# Patient Record
Sex: Female | Born: 1955 | Race: Black or African American | Hispanic: No | Marital: Single | Smoking: Never smoker
Health system: Southern US, Community
[De-identification: ages and names within clinical notes are randomized; demographics above are authoritative.]

## PROBLEM LIST (undated history)

## (undated) DIAGNOSIS — R51 Headache: Secondary | ICD-10-CM

## (undated) DIAGNOSIS — G459 Transient cerebral ischemic attack, unspecified: Secondary | ICD-10-CM

## (undated) DIAGNOSIS — Z992 Dependence on renal dialysis: Secondary | ICD-10-CM

## (undated) DIAGNOSIS — G629 Polyneuropathy, unspecified: Secondary | ICD-10-CM

## (undated) DIAGNOSIS — E785 Hyperlipidemia, unspecified: Secondary | ICD-10-CM

## (undated) DIAGNOSIS — I255 Ischemic cardiomyopathy: Secondary | ICD-10-CM

## (undated) DIAGNOSIS — D649 Anemia, unspecified: Secondary | ICD-10-CM

## (undated) DIAGNOSIS — I5022 Chronic systolic (congestive) heart failure: Secondary | ICD-10-CM

## (undated) DIAGNOSIS — I739 Peripheral vascular disease, unspecified: Secondary | ICD-10-CM

## (undated) DIAGNOSIS — E11621 Type 2 diabetes mellitus with foot ulcer: Secondary | ICD-10-CM

## (undated) DIAGNOSIS — E119 Type 2 diabetes mellitus without complications: Secondary | ICD-10-CM

## (undated) DIAGNOSIS — J811 Chronic pulmonary edema: Secondary | ICD-10-CM

## (undated) DIAGNOSIS — T148XXA Other injury of unspecified body region, initial encounter: Secondary | ICD-10-CM

## (undated) DIAGNOSIS — R519 Headache, unspecified: Secondary | ICD-10-CM

## (undated) DIAGNOSIS — M329 Systemic lupus erythematosus, unspecified: Secondary | ICD-10-CM

## (undated) DIAGNOSIS — N2581 Secondary hyperparathyroidism of renal origin: Secondary | ICD-10-CM

## (undated) DIAGNOSIS — G4733 Obstructive sleep apnea (adult) (pediatric): Secondary | ICD-10-CM

## (undated) DIAGNOSIS — L089 Local infection of the skin and subcutaneous tissue, unspecified: Secondary | ICD-10-CM

## (undated) DIAGNOSIS — I272 Pulmonary hypertension, unspecified: Secondary | ICD-10-CM

## (undated) DIAGNOSIS — N289 Disorder of kidney and ureter, unspecified: Secondary | ICD-10-CM

## (undated) DIAGNOSIS — R0602 Shortness of breath: Secondary | ICD-10-CM

## (undated) DIAGNOSIS — G5601 Carpal tunnel syndrome, right upper limb: Secondary | ICD-10-CM

## (undated) DIAGNOSIS — I2510 Atherosclerotic heart disease of native coronary artery without angina pectoris: Secondary | ICD-10-CM

## (undated) DIAGNOSIS — L97409 Non-pressure chronic ulcer of unspecified heel and midfoot with unspecified severity: Secondary | ICD-10-CM

## (undated) DIAGNOSIS — I4891 Unspecified atrial fibrillation: Secondary | ICD-10-CM

## (undated) DIAGNOSIS — E079 Disorder of thyroid, unspecified: Secondary | ICD-10-CM

## (undated) DIAGNOSIS — L723 Sebaceous cyst: Secondary | ICD-10-CM

## (undated) DIAGNOSIS — E1142 Type 2 diabetes mellitus with diabetic polyneuropathy: Secondary | ICD-10-CM

## (undated) HISTORY — DX: Peripheral vascular disease, unspecified: I73.9

## (undated) HISTORY — PX: KIDNEY TRANSPLANT: SHX239

## (undated) HISTORY — DX: Disorder of kidney and ureter, unspecified: N28.9

## (undated) HISTORY — PX: ABOVE KNEE LEG AMPUTATION: SUR20

## (undated) HISTORY — DX: Hyperlipidemia, unspecified: E78.5

## (undated) HISTORY — DX: Polyneuropathy, unspecified: G62.9

## (undated) HISTORY — DX: Atherosclerotic heart disease of native coronary artery without angina pectoris: I25.10

## (undated) HISTORY — PX: COLONOSCOPY: SHX174

## (undated) HISTORY — DX: Unspecified atrial fibrillation: I48.91

## (undated) HISTORY — DX: Chronic pulmonary edema: J81.1

## (undated) HISTORY — PX: CARDIAC CATHETERIZATION: SHX172

## (undated) HISTORY — PX: OVARIAN CYST SURGERY: SHX726

## (undated) HISTORY — DX: Sebaceous cyst: L72.3

## (undated) HISTORY — DX: Type 2 diabetes mellitus without complications: E11.9

## (undated) HISTORY — DX: Disorder of thyroid, unspecified: E07.9

## (undated) HISTORY — DX: Pulmonary hypertension, unspecified: I27.20

## (undated) HISTORY — PX: INSERTION OF DIALYSIS CATHETER: SHX1324

## (undated) HISTORY — DX: Secondary hyperparathyroidism of renal origin: N25.81

## (undated) HISTORY — DX: Anemia, unspecified: D64.9

## (undated) HISTORY — DX: Systemic lupus erythematosus, unspecified: M32.9

## (undated) HISTORY — DX: Type 2 diabetes mellitus with diabetic polyneuropathy: E11.42

## (undated) HISTORY — DX: Chronic systolic (congestive) heart failure: I50.22

## (undated) HISTORY — DX: Ischemic cardiomyopathy: I25.5

## (undated) HISTORY — PX: BELOW KNEE LEG AMPUTATION: SUR23

## (undated) HISTORY — DX: Obstructive sleep apnea (adult) (pediatric): G47.33

## (undated) SURGERY — ECHOCARDIOGRAM, TRANSESOPHAGEAL
Anesthesia: Moderate Sedation

---

## 1998-03-13 ENCOUNTER — Ambulatory Visit (HOSPITAL_COMMUNITY): Admission: RE | Admit: 1998-03-13 | Discharge: 1998-03-13 | Payer: Self-pay | Admitting: Interventional Cardiology

## 1998-07-08 ENCOUNTER — Other Ambulatory Visit: Admission: RE | Admit: 1998-07-08 | Discharge: 1998-07-08 | Payer: Self-pay | Admitting: Obstetrics and Gynecology

## 1998-09-22 ENCOUNTER — Other Ambulatory Visit: Admission: RE | Admit: 1998-09-22 | Discharge: 1998-09-22 | Payer: Self-pay | Admitting: Obstetrics and Gynecology

## 1998-10-14 ENCOUNTER — Encounter: Payer: Self-pay | Admitting: Nephrology

## 1998-10-14 ENCOUNTER — Ambulatory Visit (HOSPITAL_COMMUNITY): Admission: RE | Admit: 1998-10-14 | Discharge: 1998-10-14 | Payer: Self-pay | Admitting: Nephrology

## 1999-01-01 ENCOUNTER — Encounter (HOSPITAL_COMMUNITY): Admission: RE | Admit: 1999-01-01 | Discharge: 1999-04-01 | Payer: Self-pay | Admitting: Nephrology

## 1999-02-06 ENCOUNTER — Ambulatory Visit (HOSPITAL_COMMUNITY): Admission: RE | Admit: 1999-02-06 | Discharge: 1999-02-06 | Payer: Self-pay | Admitting: Vascular Surgery

## 1999-02-06 ENCOUNTER — Encounter: Payer: Self-pay | Admitting: Vascular Surgery

## 1999-04-09 ENCOUNTER — Encounter (HOSPITAL_COMMUNITY): Admission: RE | Admit: 1999-04-09 | Discharge: 1999-07-08 | Payer: Self-pay | Admitting: Nephrology

## 1999-07-08 ENCOUNTER — Other Ambulatory Visit: Admission: RE | Admit: 1999-07-08 | Discharge: 1999-07-08 | Payer: Self-pay | Admitting: Obstetrics and Gynecology

## 1999-07-09 ENCOUNTER — Other Ambulatory Visit: Admission: RE | Admit: 1999-07-09 | Discharge: 1999-07-09 | Payer: Self-pay | Admitting: Otolaryngology

## 1999-07-09 ENCOUNTER — Encounter (HOSPITAL_COMMUNITY): Admission: RE | Admit: 1999-07-09 | Discharge: 1999-10-07 | Payer: Self-pay | Admitting: Nephrology

## 1999-07-21 ENCOUNTER — Encounter: Admission: RE | Admit: 1999-07-21 | Discharge: 1999-07-21 | Payer: Self-pay | Admitting: Otolaryngology

## 1999-07-21 ENCOUNTER — Encounter: Payer: Self-pay | Admitting: Otolaryngology

## 1999-11-13 ENCOUNTER — Encounter: Admission: RE | Admit: 1999-11-13 | Discharge: 1999-11-13 | Payer: Self-pay | Admitting: Obstetrics and Gynecology

## 1999-11-13 ENCOUNTER — Encounter: Payer: Self-pay | Admitting: Obstetrics and Gynecology

## 1999-11-16 ENCOUNTER — Ambulatory Visit (HOSPITAL_COMMUNITY): Admission: RE | Admit: 1999-11-16 | Discharge: 1999-11-16 | Payer: Self-pay | Admitting: *Deleted

## 1999-11-18 ENCOUNTER — Encounter: Payer: Self-pay | Admitting: Nephrology

## 1999-11-18 ENCOUNTER — Ambulatory Visit (HOSPITAL_COMMUNITY): Admission: RE | Admit: 1999-11-18 | Discharge: 1999-11-18 | Payer: Self-pay | Admitting: Nephrology

## 2000-03-29 ENCOUNTER — Ambulatory Visit (HOSPITAL_COMMUNITY): Admission: RE | Admit: 2000-03-29 | Discharge: 2000-03-29 | Payer: Self-pay | Admitting: *Deleted

## 2000-07-12 ENCOUNTER — Other Ambulatory Visit: Admission: RE | Admit: 2000-07-12 | Discharge: 2000-07-12 | Payer: Self-pay | Admitting: Obstetrics and Gynecology

## 2000-07-21 ENCOUNTER — Encounter: Payer: Self-pay | Admitting: *Deleted

## 2000-07-21 ENCOUNTER — Ambulatory Visit (HOSPITAL_COMMUNITY): Admission: RE | Admit: 2000-07-21 | Discharge: 2000-07-21 | Payer: Self-pay | Admitting: *Deleted

## 2000-11-09 ENCOUNTER — Ambulatory Visit (HOSPITAL_COMMUNITY): Admission: RE | Admit: 2000-11-09 | Discharge: 2000-11-09 | Payer: Self-pay | Admitting: Nephrology

## 2000-11-09 ENCOUNTER — Encounter: Payer: Self-pay | Admitting: Nephrology

## 2000-12-08 ENCOUNTER — Encounter: Payer: Self-pay | Admitting: Obstetrics and Gynecology

## 2000-12-08 ENCOUNTER — Encounter: Admission: RE | Admit: 2000-12-08 | Discharge: 2000-12-08 | Payer: Self-pay | Admitting: Obstetrics and Gynecology

## 2001-01-26 ENCOUNTER — Encounter: Payer: Self-pay | Admitting: Nephrology

## 2001-01-26 ENCOUNTER — Ambulatory Visit (HOSPITAL_COMMUNITY): Admission: RE | Admit: 2001-01-26 | Discharge: 2001-01-26 | Payer: Self-pay | Admitting: Nephrology

## 2001-07-18 ENCOUNTER — Ambulatory Visit (HOSPITAL_COMMUNITY): Admission: RE | Admit: 2001-07-18 | Discharge: 2001-07-18 | Payer: Self-pay | Admitting: Nephrology

## 2001-09-04 ENCOUNTER — Other Ambulatory Visit: Admission: RE | Admit: 2001-09-04 | Discharge: 2001-09-04 | Payer: Self-pay | Admitting: Obstetrics and Gynecology

## 2001-12-12 ENCOUNTER — Ambulatory Visit (HOSPITAL_COMMUNITY): Admission: RE | Admit: 2001-12-12 | Discharge: 2001-12-12 | Payer: Self-pay | Admitting: Nephrology

## 2001-12-12 ENCOUNTER — Encounter: Admission: RE | Admit: 2001-12-12 | Discharge: 2001-12-12 | Payer: Self-pay | Admitting: Obstetrics and Gynecology

## 2001-12-12 ENCOUNTER — Encounter: Payer: Self-pay | Admitting: Obstetrics and Gynecology

## 2001-12-12 ENCOUNTER — Encounter: Payer: Self-pay | Admitting: Nephrology

## 2002-09-11 ENCOUNTER — Other Ambulatory Visit: Admission: RE | Admit: 2002-09-11 | Discharge: 2002-09-11 | Payer: Self-pay | Admitting: Obstetrics and Gynecology

## 2003-01-29 ENCOUNTER — Encounter: Payer: Self-pay | Admitting: Obstetrics and Gynecology

## 2003-01-29 ENCOUNTER — Ambulatory Visit (HOSPITAL_COMMUNITY): Admission: RE | Admit: 2003-01-29 | Discharge: 2003-01-29 | Payer: Self-pay | Admitting: Nephrology

## 2003-01-29 ENCOUNTER — Encounter: Payer: Self-pay | Admitting: Nephrology

## 2003-01-29 ENCOUNTER — Encounter: Admission: RE | Admit: 2003-01-29 | Discharge: 2003-01-29 | Payer: Self-pay | Admitting: Obstetrics and Gynecology

## 2003-07-30 ENCOUNTER — Ambulatory Visit (HOSPITAL_COMMUNITY): Admission: RE | Admit: 2003-07-30 | Discharge: 2003-07-30 | Payer: Self-pay | Admitting: Interventional Cardiology

## 2003-09-19 ENCOUNTER — Other Ambulatory Visit: Admission: RE | Admit: 2003-09-19 | Discharge: 2003-09-19 | Payer: Self-pay | Admitting: Obstetrics and Gynecology

## 2003-10-30 ENCOUNTER — Ambulatory Visit (HOSPITAL_COMMUNITY): Admission: RE | Admit: 2003-10-30 | Discharge: 2003-10-30 | Payer: Self-pay | Admitting: Gastroenterology

## 2004-04-03 ENCOUNTER — Encounter: Admission: RE | Admit: 2004-04-03 | Discharge: 2004-04-03 | Payer: Self-pay | Admitting: Obstetrics and Gynecology

## 2004-09-29 ENCOUNTER — Other Ambulatory Visit: Admission: RE | Admit: 2004-09-29 | Discharge: 2004-09-29 | Payer: Self-pay | Admitting: Obstetrics and Gynecology

## 2005-04-20 ENCOUNTER — Encounter: Admission: RE | Admit: 2005-04-20 | Discharge: 2005-04-20 | Payer: Self-pay | Admitting: Obstetrics and Gynecology

## 2005-07-14 ENCOUNTER — Encounter: Admission: RE | Admit: 2005-07-14 | Discharge: 2005-07-14 | Payer: Self-pay | Admitting: Internal Medicine

## 2006-03-24 ENCOUNTER — Encounter: Admission: RE | Admit: 2006-03-24 | Discharge: 2006-04-21 | Payer: Self-pay | Admitting: Nephrology

## 2006-04-28 ENCOUNTER — Encounter: Admission: RE | Admit: 2006-04-28 | Discharge: 2006-04-28 | Payer: Self-pay | Admitting: Obstetrics and Gynecology

## 2006-05-06 ENCOUNTER — Encounter: Admission: RE | Admit: 2006-05-06 | Discharge: 2006-05-06 | Payer: Self-pay | Admitting: Nephrology

## 2006-05-10 ENCOUNTER — Encounter: Admission: RE | Admit: 2006-05-10 | Discharge: 2006-05-10 | Payer: Self-pay | Admitting: Obstetrics and Gynecology

## 2006-05-26 ENCOUNTER — Encounter: Admission: RE | Admit: 2006-05-26 | Discharge: 2006-05-26 | Payer: Self-pay | Admitting: Nephrology

## 2006-07-28 ENCOUNTER — Encounter: Admission: RE | Admit: 2006-07-28 | Discharge: 2006-08-23 | Payer: Self-pay | Admitting: Nephrology

## 2006-10-20 ENCOUNTER — Ambulatory Visit (HOSPITAL_COMMUNITY): Admission: RE | Admit: 2006-10-20 | Discharge: 2006-10-20 | Payer: Self-pay | Admitting: Nephrology

## 2006-12-21 ENCOUNTER — Ambulatory Visit: Payer: Self-pay | Admitting: Vascular Surgery

## 2007-01-26 ENCOUNTER — Ambulatory Visit: Payer: Self-pay | Admitting: Vascular Surgery

## 2007-03-22 ENCOUNTER — Encounter: Admission: RE | Admit: 2007-03-22 | Discharge: 2007-03-22 | Payer: Self-pay | Admitting: Nephrology

## 2007-03-23 ENCOUNTER — Encounter (INDEPENDENT_AMBULATORY_CARE_PROVIDER_SITE_OTHER): Payer: Self-pay | Admitting: Nephrology

## 2007-03-23 ENCOUNTER — Ambulatory Visit: Payer: Self-pay | Admitting: Vascular Surgery

## 2007-03-23 ENCOUNTER — Ambulatory Visit: Admission: RE | Admit: 2007-03-23 | Discharge: 2007-03-23 | Payer: Self-pay | Admitting: Nephrology

## 2007-03-26 ENCOUNTER — Encounter: Admission: RE | Admit: 2007-03-26 | Discharge: 2007-03-26 | Payer: Self-pay | Admitting: Nephrology

## 2007-05-05 ENCOUNTER — Encounter: Admission: RE | Admit: 2007-05-05 | Discharge: 2007-05-05 | Payer: Self-pay | Admitting: Obstetrics and Gynecology

## 2007-05-18 ENCOUNTER — Encounter: Admission: RE | Admit: 2007-05-18 | Discharge: 2007-05-18 | Payer: Self-pay | Admitting: Interventional Cardiology

## 2007-06-20 ENCOUNTER — Ambulatory Visit: Payer: Self-pay | Admitting: Vascular Surgery

## 2007-07-14 ENCOUNTER — Encounter: Admission: RE | Admit: 2007-07-14 | Discharge: 2007-07-14 | Payer: Self-pay | Admitting: Nephrology

## 2007-07-17 ENCOUNTER — Ambulatory Visit: Payer: Self-pay | Admitting: Surgery

## 2007-08-08 DIAGNOSIS — M329 Systemic lupus erythematosus, unspecified: Secondary | ICD-10-CM

## 2007-08-08 HISTORY — DX: Systemic lupus erythematosus, unspecified: M32.9

## 2007-08-22 ENCOUNTER — Ambulatory Visit: Payer: Self-pay | Admitting: Vascular Surgery

## 2007-09-15 ENCOUNTER — Ambulatory Visit: Payer: Self-pay | Admitting: Vascular Surgery

## 2007-09-19 ENCOUNTER — Ambulatory Visit: Payer: Self-pay | Admitting: Surgery

## 2007-09-19 ENCOUNTER — Inpatient Hospital Stay (HOSPITAL_COMMUNITY): Admission: AD | Admit: 2007-09-19 | Discharge: 2007-10-03 | Payer: Self-pay | Admitting: Surgery

## 2007-09-20 ENCOUNTER — Encounter: Payer: Self-pay | Admitting: Vascular Surgery

## 2007-09-21 ENCOUNTER — Ambulatory Visit: Payer: Self-pay | Admitting: Physical Medicine & Rehabilitation

## 2007-09-27 ENCOUNTER — Encounter: Payer: Self-pay | Admitting: Vascular Surgery

## 2007-10-03 ENCOUNTER — Inpatient Hospital Stay (HOSPITAL_COMMUNITY)
Admission: RE | Admit: 2007-10-03 | Discharge: 2007-10-11 | Payer: Self-pay | Admitting: Physical Medicine & Rehabilitation

## 2007-10-24 ENCOUNTER — Ambulatory Visit: Payer: Self-pay | Admitting: Vascular Surgery

## 2007-11-07 ENCOUNTER — Ambulatory Visit: Payer: Self-pay | Admitting: Vascular Surgery

## 2007-12-04 ENCOUNTER — Encounter
Admission: RE | Admit: 2007-12-04 | Discharge: 2007-12-05 | Payer: Self-pay | Admitting: Physical Medicine & Rehabilitation

## 2007-12-04 ENCOUNTER — Ambulatory Visit: Payer: Self-pay | Admitting: Physical Medicine & Rehabilitation

## 2007-12-05 ENCOUNTER — Ambulatory Visit: Payer: Self-pay | Admitting: Vascular Surgery

## 2007-12-19 ENCOUNTER — Ambulatory Visit: Payer: Self-pay | Admitting: Vascular Surgery

## 2008-01-02 ENCOUNTER — Ambulatory Visit: Payer: Self-pay | Admitting: Vascular Surgery

## 2008-01-16 ENCOUNTER — Ambulatory Visit: Payer: Self-pay | Admitting: Vascular Surgery

## 2008-01-17 ENCOUNTER — Ambulatory Visit (HOSPITAL_COMMUNITY): Admission: RE | Admit: 2008-01-17 | Discharge: 2008-01-17 | Payer: Self-pay | Admitting: Nephrology

## 2008-01-23 ENCOUNTER — Encounter (INDEPENDENT_AMBULATORY_CARE_PROVIDER_SITE_OTHER): Payer: Self-pay | Admitting: Gastroenterology

## 2008-01-23 ENCOUNTER — Ambulatory Visit (HOSPITAL_COMMUNITY): Admission: RE | Admit: 2008-01-23 | Discharge: 2008-01-23 | Payer: Self-pay | Admitting: Gastroenterology

## 2008-01-30 ENCOUNTER — Ambulatory Visit: Payer: Self-pay | Admitting: Vascular Surgery

## 2008-02-13 ENCOUNTER — Ambulatory Visit: Payer: Self-pay | Admitting: Vascular Surgery

## 2008-03-05 ENCOUNTER — Ambulatory Visit: Payer: Self-pay | Admitting: Vascular Surgery

## 2008-03-26 ENCOUNTER — Ambulatory Visit: Payer: Self-pay | Admitting: Vascular Surgery

## 2008-04-16 ENCOUNTER — Ambulatory Visit: Payer: Self-pay | Admitting: Vascular Surgery

## 2008-05-07 ENCOUNTER — Ambulatory Visit: Payer: Self-pay | Admitting: Vascular Surgery

## 2008-05-16 ENCOUNTER — Encounter: Admission: RE | Admit: 2008-05-16 | Discharge: 2008-05-16 | Payer: Self-pay | Admitting: Obstetrics and Gynecology

## 2008-05-28 ENCOUNTER — Ambulatory Visit: Payer: Self-pay | Admitting: Vascular Surgery

## 2008-07-02 ENCOUNTER — Ambulatory Visit: Payer: Self-pay | Admitting: Vascular Surgery

## 2008-07-30 ENCOUNTER — Ambulatory Visit: Payer: Self-pay | Admitting: Vascular Surgery

## 2008-08-27 ENCOUNTER — Ambulatory Visit: Payer: Self-pay | Admitting: Vascular Surgery

## 2008-09-10 ENCOUNTER — Ambulatory Visit: Payer: Self-pay | Admitting: Vascular Surgery

## 2008-11-19 ENCOUNTER — Ambulatory Visit: Payer: Self-pay | Admitting: Vascular Surgery

## 2008-12-24 ENCOUNTER — Ambulatory Visit: Payer: Self-pay | Admitting: Vascular Surgery

## 2009-02-04 ENCOUNTER — Encounter: Admission: RE | Admit: 2009-02-04 | Discharge: 2009-05-05 | Payer: Self-pay | Admitting: Vascular Surgery

## 2009-02-25 ENCOUNTER — Ambulatory Visit (HOSPITAL_COMMUNITY): Admission: RE | Admit: 2009-02-25 | Discharge: 2009-02-25 | Payer: Self-pay | Admitting: Nephrology

## 2009-03-25 ENCOUNTER — Ambulatory Visit: Payer: Self-pay | Admitting: Vascular Surgery

## 2009-04-08 ENCOUNTER — Ambulatory Visit: Payer: Self-pay | Admitting: Vascular Surgery

## 2009-04-15 ENCOUNTER — Encounter: Admission: RE | Admit: 2009-04-15 | Discharge: 2009-04-15 | Payer: Self-pay | Admitting: Podiatry

## 2009-04-24 ENCOUNTER — Encounter: Admission: RE | Admit: 2009-04-24 | Discharge: 2009-06-16 | Payer: Self-pay | Admitting: Vascular Surgery

## 2009-05-20 ENCOUNTER — Encounter: Admission: RE | Admit: 2009-05-20 | Discharge: 2009-05-20 | Payer: Self-pay | Admitting: Obstetrics and Gynecology

## 2009-08-26 ENCOUNTER — Ambulatory Visit (HOSPITAL_COMMUNITY): Admission: RE | Admit: 2009-08-26 | Discharge: 2009-08-26 | Payer: Self-pay | Admitting: Nephrology

## 2009-09-13 HISTORY — PX: OTHER SURGICAL HISTORY: SHX169

## 2009-09-13 HISTORY — PX: CORONARY ARTERY BYPASS GRAFT: SHX141

## 2009-12-03 ENCOUNTER — Encounter: Admission: RE | Admit: 2009-12-03 | Discharge: 2009-12-03 | Payer: Self-pay | Admitting: Nephrology

## 2009-12-30 ENCOUNTER — Ambulatory Visit (HOSPITAL_COMMUNITY): Admission: RE | Admit: 2009-12-30 | Discharge: 2009-12-30 | Payer: Self-pay | Admitting: Nephrology

## 2010-01-16 ENCOUNTER — Ambulatory Visit (HOSPITAL_COMMUNITY): Admission: RE | Admit: 2010-01-16 | Discharge: 2010-01-16 | Payer: Self-pay | Admitting: Nephrology

## 2010-01-27 ENCOUNTER — Inpatient Hospital Stay (HOSPITAL_BASED_OUTPATIENT_CLINIC_OR_DEPARTMENT_OTHER): Admission: RE | Admit: 2010-01-27 | Discharge: 2010-01-27 | Payer: Self-pay | Admitting: Interventional Cardiology

## 2010-02-03 ENCOUNTER — Encounter (HOSPITAL_COMMUNITY): Admission: RE | Admit: 2010-02-03 | Discharge: 2010-05-04 | Payer: Self-pay | Admitting: Interventional Cardiology

## 2010-02-16 ENCOUNTER — Ambulatory Visit: Payer: Self-pay | Admitting: Thoracic Surgery (Cardiothoracic Vascular Surgery)

## 2010-03-03 ENCOUNTER — Encounter: Payer: Self-pay | Admitting: Thoracic Surgery (Cardiothoracic Vascular Surgery)

## 2010-03-05 ENCOUNTER — Ambulatory Visit: Payer: Self-pay | Admitting: Thoracic Surgery (Cardiothoracic Vascular Surgery)

## 2010-03-05 ENCOUNTER — Inpatient Hospital Stay (HOSPITAL_COMMUNITY)
Admission: RE | Admit: 2010-03-05 | Discharge: 2010-03-31 | Payer: Self-pay | Admitting: Thoracic Surgery (Cardiothoracic Vascular Surgery)

## 2010-03-05 ENCOUNTER — Ambulatory Visit: Payer: Self-pay | Admitting: Pulmonary Disease

## 2010-03-05 ENCOUNTER — Encounter: Payer: Self-pay | Admitting: Thoracic Surgery (Cardiothoracic Vascular Surgery)

## 2010-03-24 ENCOUNTER — Encounter (INDEPENDENT_AMBULATORY_CARE_PROVIDER_SITE_OTHER): Payer: Self-pay | Admitting: Nephrology

## 2010-03-26 ENCOUNTER — Ambulatory Visit: Payer: Self-pay | Admitting: Physical Medicine & Rehabilitation

## 2010-03-31 ENCOUNTER — Inpatient Hospital Stay (HOSPITAL_COMMUNITY)
Admission: RE | Admit: 2010-03-31 | Discharge: 2010-04-10 | Payer: Self-pay | Admitting: Physical Medicine & Rehabilitation

## 2010-04-21 ENCOUNTER — Encounter
Admission: RE | Admit: 2010-04-21 | Discharge: 2010-04-21 | Payer: Self-pay | Admitting: Thoracic Surgery (Cardiothoracic Vascular Surgery)

## 2010-04-21 ENCOUNTER — Ambulatory Visit: Payer: Self-pay | Admitting: Thoracic Surgery (Cardiothoracic Vascular Surgery)

## 2010-05-19 ENCOUNTER — Encounter: Admission: RE | Admit: 2010-05-19 | Discharge: 2010-05-19 | Payer: Self-pay | Admitting: Critical Care Medicine

## 2010-05-26 ENCOUNTER — Encounter: Admission: RE | Admit: 2010-05-26 | Discharge: 2010-05-26 | Payer: Self-pay | Admitting: Obstetrics and Gynecology

## 2010-05-29 ENCOUNTER — Encounter: Admission: RE | Admit: 2010-05-29 | Discharge: 2010-05-29 | Payer: Self-pay | Admitting: Obstetrics and Gynecology

## 2010-07-27 ENCOUNTER — Ambulatory Visit: Payer: Self-pay | Admitting: Internal Medicine

## 2010-07-27 DIAGNOSIS — I499 Cardiac arrhythmia, unspecified: Secondary | ICD-10-CM | POA: Insufficient documentation

## 2010-07-27 DIAGNOSIS — G4733 Obstructive sleep apnea (adult) (pediatric): Secondary | ICD-10-CM

## 2010-07-27 DIAGNOSIS — N186 End stage renal disease: Secondary | ICD-10-CM | POA: Insufficient documentation

## 2010-07-27 DIAGNOSIS — E1129 Type 2 diabetes mellitus with other diabetic kidney complication: Secondary | ICD-10-CM | POA: Insufficient documentation

## 2010-07-28 DIAGNOSIS — E785 Hyperlipidemia, unspecified: Secondary | ICD-10-CM

## 2010-07-28 DIAGNOSIS — I10 Essential (primary) hypertension: Secondary | ICD-10-CM | POA: Insufficient documentation

## 2010-08-05 ENCOUNTER — Encounter: Payer: Self-pay | Admitting: Internal Medicine

## 2010-08-05 ENCOUNTER — Ambulatory Visit (HOSPITAL_BASED_OUTPATIENT_CLINIC_OR_DEPARTMENT_OTHER)
Admission: RE | Admit: 2010-08-05 | Discharge: 2010-08-05 | Payer: Self-pay | Source: Home / Self Care | Admitting: Internal Medicine

## 2010-08-11 ENCOUNTER — Encounter: Admission: RE | Admit: 2010-08-11 | Discharge: 2010-08-11 | Payer: Self-pay | Admitting: Nephrology

## 2010-09-18 ENCOUNTER — Ambulatory Visit
Admission: RE | Admit: 2010-09-18 | Discharge: 2010-09-18 | Payer: Self-pay | Source: Home / Self Care | Attending: Internal Medicine | Admitting: Internal Medicine

## 2010-10-04 ENCOUNTER — Encounter: Payer: Self-pay | Admitting: Obstetrics and Gynecology

## 2010-10-04 ENCOUNTER — Encounter: Payer: Self-pay | Admitting: Nephrology

## 2010-10-15 ENCOUNTER — Encounter: Payer: Self-pay | Admitting: Internal Medicine

## 2010-10-15 NOTE — Assessment & Plan Note (Signed)
Summary: F/U SLEEP STUDY ///KP   CC:  Follow up visit-review sleep study.  History of Present Illness: History of Present Illness: July 27, 2010- 54 yoF referred courtesy of Dr Fayrene Fearing Deterding with suspected sleep apnea.  She sleeps alone, but told she snores,  and is aware of some daytime sleepiness. During hospital stays this year for revision of a dialysis shunt and later for CABG, the nurses told her they witnessed apneas.  Wakes occasionally with cough, but not choked, smothering or w/ heartburn.Hands go numb in sleep.  Bedtime 1130-100AM, latency 1 hour, several short wakings but usually comfortable, up M-W-F at 415AM for dialysis, otw 830AM. Often not sleepy the night before dialysis and may get only 3-4 hours sleep those nights. Hx nasal bx for ?? in past but no other ENT surgery.  September 18, 2010- OSA/ CSA, ESRD Nurse-CC: Follow up visit-review sleep study Not aware of times when she finds she isn't taking breaths. NPSG reviewed wih her. Severe obstructive and central apnea. Failed CPAP titration and had not accomplished BIPAP control, mostly due to residual central apneas. We discussed sleep hygiene, treatment and medical significance, and available treatments.   Preventive Screening-Counseling & Management  Alcohol-Tobacco     Alcohol drinks/day: 0     Smoking Status: never  Current Medications (verified): 1)  Dialyvite 800 0.8 Mg Tabs (B Complex-C-Folic Acid) .... Take 1 By Mouth Once Daily 2)  Sensipar 60 Mg Tabs (Cinacalcet Hcl) .... Take 1/2 By Mouth Once Daily 3)  Aspirin 325 Mg Tabs (Aspirin) .... Take 1 By Mouth Once Daily 4)  Lipitor 10 Mg Tabs (Atorvastatin Calcium) .... Take 1 By Mouth Once Daily 5)  Midodrine Hcl 5 Mg Tabs (Midodrine Hcl) .... Take 1 At 5am On M, W, F 6)  Renvela 800 Mg Tabs (Sevelamer Carbonate) .... Take 3 By Mouth Three Times A Day 7)  Novolog Flexpen 100 Unit/ml Soln (Insulin Aspart) .... Silding Scale 8)  Lantus 100 Unit/ml Soln  (Insulin Glargine) .... Take 8 Units Every Evening 9)  Super B-100  Tabs (B Complex-Biotin-Fa) .... Take 1 By Mouth Once Daily 10)  Tylenol 325 Mg Tabs (Acetaminophen) .... As Needed Bottle  Allergies (verified): 1)  ! * Vytorin 2)  ! * Neurotin  Past History:  Past Surgical History: Last updated: 07/27/2010 Dialysis access CABG- 3V- 2011 Right AK amputation- infection/ gangrene 10/11/07 nasal mucosal biopsy Ovarian Cyst  Removal of failed cadaveric renal transplant 2006- wound infection laser retinal surgery -2011  Family History: Last updated: 07/27/2010 Family hx:: Heart Disease Father- died fluid overload Mother- died cerebral aneurysm Brother- hx MI  Social History: Last updated: 07/27/2010 Single; no children Lives alone Non Smoker Kidney dyalisis patient-Disabled No ETOH  Risk Factors: Alcohol Use: 0 (09/18/2010)  Risk Factors: Smoking Status: never (09/18/2010)  Past Medical History: Diabetes II Kidney Disease- ESRD secondary to DM,  failed transplant 2006. Dialysis Secondary Hyperparathyroidism Obstructive sleep apnea- NPSG 11.23.11- AHI 64.9/hr- central and obstructive apnea Ischemic cardiomyopathy EF 25-30% 2011 CAD- CABG 2011 Pulmonary Hypertension Est PAsyst 60- Dr Mendel Ryder, off coumadin, CT neg for PE Hyperlipidemia Hypertension Anemia Systemic Lupus -08/08/07- Dr Phylliss Bob- Pred/ plaquenil  Review of Systems      See HPI       The patient complains of dyspnea on exertion.  The patient denies anorexia, fever, weight loss, weight gain, vision loss, decreased hearing, hoarseness, chest pain, syncope, peripheral edema, prolonged cough, headaches, hemoptysis, abdominal pain, and severe indigestion/heartburn.    Vital  Signs:  Patient profile:   55 year old female Weight:      210.50 pounds O2 Sat:      100 % on Room air Pulse rate:   104 / minute BP sitting:   12 / 58  (left arm) Cuff size:   large  Vitals Entered By: Reynaldo Minium CMA (September 18, 2010 2:13 PM)  O2 Flow:  Room air CC: Follow up visit-review sleep study   Physical Exam  Additional Exam:  General: A/Ox3; pleasant and cooperative, NAD, alert SKIN: no rash, lesions NODES: no lymphadenopathy HEENT: [place]/AT, EOM- WNL, Conjuctivae- R eye red, L strabismus, PERRLA, TM-WNL, Nose- clear, Throat- clear and wnl, Mallampati  III NECK: Supple w/ fair ROM, JVD- none, normal carotid impulses w/o bruits Thyroid- normal to palpation CHEST: Clear to P&A HEART: RRR, no m/g/r heard ABDOMEN: Soft and nl; nml bowel sounds; no organomegaly or masses noted ZOX:WRUE, nl pulses, no edema. Access Left upper arm, Right AKA. Cane.  NEURO: Grossly intact to observation      Impression & Recommendations:  Problem # 1:  OBSTRUCTIVE SLEEP APNEA (ICD-327.23)  She has central and obstructive sleep apnea. We were not able to demonstrate a good response to CPAP/ BiPAPa and I suspecdt the problem was Central events. i don't see major upper airway obstruction on exam. She is willing to try CPAP and we will use autotitration. It might be that we will end up just with home oxygen.   Problem # 2:  END STAGE RENAL DISEASE (ICD-585.6) Fluid retention may adversely affect CPAP control as discussed. We discussed potential impact on BP. I also hope she will be able to tolerate the imposition of CPAP in addition to her dialysis.   Medications Added to Medication List This Visit: 1)  Tylenol 325 Mg Tabs (Acetaminophen) .... As needed bottle 2)  Cpap Autotitration   Other Orders: Est. Patient Level IV (45409) DME Referral (DME)  Patient Instructions: 1)  Please schedule a follow-up appointment in 1 month.  Please call as needed. 2)  See [facility] to start CPAP

## 2010-10-15 NOTE — Assessment & Plan Note (Signed)
Summary: sleep apnea/jd  Pt not seen today-RSC to November 14th 130pm.Katie [facility] CMA  July 21, 2010 4:28 PM          Other Orders: No Charge Patient Arrived (NCPA0) (NCPA0)

## 2010-10-15 NOTE — Assessment & Plan Note (Signed)
Summary: sleep apnea/jd   CC:  sleep Consult-Dr. Deterding.  History of Present Illness: July 27, 2010- 54 yoF referred courtesy of Dr Fayrene Fearing Deterding with suspected sleep apnea.  She sleeps alone, but told she snores,  and is aware of some daytime sleepiness. During hospital stays this year for revision of a dialysis shunt and later for CABG, the nurses told her they witnessed apneas.  Wakes occasionally with cough, but not choked, smothering or w/ heartburn.Hands go numb in sleep.  Bedtime 1130-100AM, latency 1 hour, several short wakings but usually comfortable, up M-W-F at 415AM for dialysis, otw 830AM. Often not sleepy the night before dialysis and may get only 3-4 hours sleep those nights. Hx nasal bx for ?? in past but no other ENT surgery.  Preventive Screening-Counseling & Management  Alcohol-Tobacco     Alcohol drinks/day: 0     Smoking Status: never  Current Medications (verified): 1)  Dialyvite 800 0.8 Mg Tabs (B Complex-C-Folic Acid) .... Take 1 By Mouth Once Daily 2)  Sensipar 60 Mg Tabs (Cinacalcet Hcl) .... Take 1/2 By Mouth Once Daily 3)  Aspirin 325 Mg Tabs (Aspirin) .... Take 1 By Mouth Once Daily 4)  Lipitor 10 Mg Tabs (Atorvastatin Calcium) .... Take 1 By Mouth Once Daily 5)  Midodrine Hcl 5 Mg Tabs (Midodrine Hcl) .... Take 1 At 5am On M, W, F 6)  Renvela 800 Mg Tabs (Sevelamer Carbonate) .... Take 3 By Mouth Three Times A Day 7)  Novolog Flexpen 100 Unit/ml Soln (Insulin Aspart) .... Silding Scale 8)  Lantus 100 Unit/ml Soln (Insulin Glargine) .... Take 8 Units Every Evening 9)  Super B-100  Tabs (B Complex-Biotin-Fa) .... Take 1 By Mouth Once Daily 10)  Ultracet 37.5-325 Mg Tabs (Tramadol-Acetaminophen) .... Take 1 By Mouth Once Daily As Needed  Allergies (verified): 1)  ! * Vytorin  Past History:  Family History: Last updated: 07/27/2010 Family hx:: Heart Disease Father- died fluid overload Mother- died cerebral aneurysm Brother- hx MI  Social  History: Last updated: 07/27/2010 Single; no children Lives alone Non Smoker Kidney dyalisis patient-Disabled No ETOH  Risk Factors: Alcohol Use: 0 (07/27/2010)  Risk Factors: Smoking Status: never (07/27/2010)  Past Medical History: Diabetes II Kidney Disease- ESRD secondary to DM,  failed transplant 2006. Dialysis Secondary Hyperparathyroidism Obstructive sleep apnea Ischemic cardiomyopathy EF 25-30% 2011 CAD- CABG 2011 Pulmonary Hypertension Est PAsyst 60- Dr Mendel Ryder, off coumadin, CT neg for PE Hyperlipidemia Hypertension Anemia Systemic Lupus -08/08/07- Dr Phylliss Bob- Pred/ plaquenil  Past Surgical History: Dialysis access CABG- 3V- 2011 Right AK amputation- infection/ gangrene 10/11/07 nasal mucosal biopsy Ovarian Cyst  Removal of failed cadaveric renal transplant 2006- wound infection laser retinal surgery -2011  Family History: Family hx:: Heart Disease Father- died fluid overload Mother- died cerebral aneurysm Brother- hx MI  Social History: Single; no children Lives alone Non Smoker Kidney dyalisis patient-Disabled No ETOHSmoking Status:  never Alcohol drinks/day:  0  Review of Systems      See HPI       The patient complains of shortness of breath with activity, loss of appetite, weight change, and tooth/dental problems.  The patient denies anorexia, fever, weight loss, weight gain, decreased hearing, hoarseness, chest pain, syncope, dyspnea on exertion, peripheral edema, prolonged cough, headaches, hemoptysis, abdominal pain, severe indigestion/heartburn, depression, and abnormal bleeding.    Vital Signs:  Patient profile:   55 year old female Weight:      207.25 pounds O2 Sat:      92 %  on Room air Pulse rate:   108 / minute BP sitting:   98 / 56  (right arm) Cuff size:   regular  Vitals Entered By: Reynaldo Minium CMA (July 27, 2010 1:38 PM)  O2 Flow:  Room air CC: sleep Consult-Dr. Deterding   Physical Exam  Additional Exam:  General:  A/Ox3; pleasant and cooperative, NAD, alert SKIN: no rash, lesions NODES: no lymphadenopathy HEENT: [place]/AT, EOM- WNL, Conjuctivae- R eye red, L strabismus, PERRLA, TM-WNL, Nose- clear, Throat- clear and wnl, Mallampati  III NECK: Supple w/ fair ROM, JVD- none, normal carotid impulses w/o bruits Thyroid- normal to palpation CHEST: Clear to P&A HEART: RRR, no m/g/r heard ABDOMEN: Soft and nl; nml bowel sounds; no organomegaly or masses noted OZH:YQMV, nl pulses, no edema. Access Left upper arm, Right AKA. Cane.  NEURO: Grossly intact to observation      Impression & Recommendations:  Problem # 1:  OBSTRUCTIVE SLEEP APNEA (ICD-327.23)  She has been repeatedly observed with sleep related apnea. We discussed the medical issues and will schedule a sleep study, which I explained in detail. If significant, sleep apnea will be a significant comorbidity burden on her cardiovascular disease.   Medications Added to Medication List This Visit: 1)  Dialyvite 800 0.8 Mg Tabs (B complex-c-folic acid) .... Take 1 by mouth once daily 2)  Sensipar 60 Mg Tabs (Cinacalcet hcl) .... Take 1/2 by mouth once daily 3)  Aspirin 325 Mg Tabs (Aspirin) .... Take 1 by mouth once daily 4)  Lipitor 10 Mg Tabs (Atorvastatin calcium) .... Take 1 by mouth once daily 5)  Midodrine Hcl 5 Mg Tabs (Midodrine hcl) .... Take 1 at 5am on m, w, f 6)  Renvela 800 Mg Tabs (Sevelamer carbonate) .... Take 3 by mouth three times a day 7)  Novolog Flexpen 100 Unit/ml Soln (Insulin aspart) .... Silding scale 8)  Lantus 100 Unit/ml Soln (Insulin glargine) .... Take 8 units every evening 9)  Super B-100 Tabs (B complex-biotin-fa) .... Take 1 by mouth once daily 10)  Ultracet 37.5-325 Mg Tabs (Tramadol-acetaminophen) .... Take 1 by mouth once daily as needed  Other Orders: Consultation Level IV (78469) DME Referral (DME)  Patient Instructions: 1)  Please schedule a follow-up appointment in 1 month. 2)  See [facility] to schedule sleep  study.

## 2010-10-23 ENCOUNTER — Ambulatory Visit: Payer: Self-pay | Admitting: Internal Medicine

## 2010-10-23 ENCOUNTER — Encounter: Payer: Self-pay | Admitting: Internal Medicine

## 2010-10-29 NOTE — Letter (Signed)
Summary: CMN for CPAP supplies/[facility]  CMN for CPAP supplies/[facility]   Imported By: Sherian Rein 10/22/2010 12:12:43  _____________________________________________________________________  External Attachment:    Type:   Image     Comment:   External Document

## 2010-11-05 ENCOUNTER — Ambulatory Visit (INDEPENDENT_AMBULATORY_CARE_PROVIDER_SITE_OTHER): Payer: Medicare Other

## 2010-11-05 DIAGNOSIS — T82898A Other specified complication of vascular prosthetic devices, implants and grafts, initial encounter: Secondary | ICD-10-CM

## 2010-11-05 DIAGNOSIS — N186 End stage renal disease: Secondary | ICD-10-CM

## 2010-11-05 NOTE — H&P (Signed)
HISTORY AND PHYSICAL EXAMINATION  November 05, 2010  Re:  [name], [name]               DOB:  [birth year]  This is a patient who has been seen by Dr. Edilia Bo in the past.  Dr. Darrick Penna is the attending physician in the office today and will be performing the surgery.  CHIEF COMPLAINT:  Pulsatile ulcer and AV fistula in the left upper extremity.  HISTORY OF PRESENT ILLNESS:  A 55 year old woman who had a left upper arm brachial cephalic fistula placed in 2000.  She had been doing well and having no difficulties until 2010 when she had resection of a large pseudoaneurysm at [facility].  Since that time they have been using the fistula.  Over the last several weeks, she has had a scab over an ulcerated area, approximately 12 cm above the antecubital space. This has occasionally had bleeding, and they have been avoiding this area in dialysis.  She is here for evaluation for potential resection and revision of arteriovenous fistula.  PAST MEDICAL HISTORY:  Significant for diabetes, peripheral vascular disease.  She had a right BKA in 2009.  She has coronary artery disease with a bypass in 2011.  She also had a kidney transplant in 2006.  SOCIAL HISTORY:  She is retired.  She does not smoke.  She has never smoked, and she does not drink any alcohol.  REVIEW OF SYSTEMS:  She has had some loss of appetite.  She is 5 feet 9 and 210 pounds.  She has had peripheral vascular disease with the ischemia and BKA, for which she uses a prosthesis. CARDIAC:  She denies any chest pain, shortness of breath, heart murmurs, or atrial fib. GASTROINTESTINAL:  She denies any black stools, blood in her stools, diarrhea, or constipation. NEUROLOGIC:  She denies any dizziness, headaches, or seizures. PULMONARY:  She uses a CPAP at night. HEMATOLOGICAL:  She has some anemia. URINARY:  She has end-stage renal disease. MUSCULOSKELETAL:  She denies any joint pain or  arthritis.  MEDICATIONS:  Atarax 25 mg t.i.d. p.r.n. itching, Midrin 5 mg 2 tablets predialysis only, Sensipar 60 mg daily, NovoLog FlexPen 100 units q.i.d. as directed, Renvela 800 mg tabs 3 with meals and then with snacks, Dialyvite tablets 1 daily, Ultracet 1-2 tablets b.i.d. as needed, Lipitor 10 mg daily, Lyrica 50 mg at bedtime, Tylenol 650 mg p.r.n. mild to moderate pain, OxyIR 5 mg 1/2 to 1 tablet q.6h. p.r.n. pain, protein supplements t.i.d., Nephro-Vite 1 tablet daily, Lantus 8 units at bedtime, Renagel 2400 mg t.i.d., aspirin 325 mg daily, Senokot 2 tablets at bedtime, and Restoril 15 mg at bedtime.  ALLERGIES:  Vytorin causes swelling.  Klonopin causes nausea and vomiting.  Zoloft causes hallucinations.  PHYSICAL EXAMINATION:  This is a well-developed, well-nourished woman in no acute distress.  HEENT: Grossly within normal limits.  Heart rate is 96.  Sats are 97%.  Her respiratory rate is 10.  Her lungs are clear without wheezes, rales, or rhonchi.  Heart: Rate and rhythm is regular without murmur or rub.  Left upper extremity shows palpable radial pulse.  She has a bean-size ulceration which is pulsatile over the upper portion of her AV fistula, which has a good thrill and bruit.  There is no bleeding at this time.  She has no skin ulcers or rashes.  She has good sensation and motion in the left arm.  ASSESSMENT/PLAN:  Ulcerative scab in the upper portion of her  arteriovenous fistula.  Plan is to put a new AV graft around the ulcerative area of the fistula on Monday, November 09, 2010.  If needed, we will put an indwelling catheter as well and will decide that at the time of surgery.  Della Goo, PA-C  Charles E. Fields, MD Electronically Signed  RR/MEDQ  D:  11/05/2010  T:  11/05/2010  Job:  [id number]

## 2010-11-08 ENCOUNTER — Telehealth: Payer: Self-pay | Admitting: Internal Medicine

## 2010-11-09 ENCOUNTER — Ambulatory Visit (HOSPITAL_COMMUNITY): Admission: RE | Admit: 2010-11-09 | Payer: Medicare Other | Source: Ambulatory Visit | Admitting: Vascular Surgery

## 2010-11-10 ENCOUNTER — Ambulatory Visit (HOSPITAL_BASED_OUTPATIENT_CLINIC_OR_DEPARTMENT_OTHER)
Admission: RE | Admit: 2010-11-10 | Discharge: 2010-11-11 | Disposition: A | Discharge status: Home / Self Care | Payer: Medicare Other | Source: Ambulatory Visit | Attending: Orthopedic Surgery | Admitting: Orthopedic Surgery

## 2010-11-12 ENCOUNTER — Ambulatory Visit (INDEPENDENT_AMBULATORY_CARE_PROVIDER_SITE_OTHER): Payer: Medicare Other | Admitting: Internal Medicine

## 2010-11-12 ENCOUNTER — Encounter: Payer: Self-pay | Admitting: Internal Medicine

## 2010-11-12 DIAGNOSIS — G4733 Obstructive sleep apnea (adult) (pediatric): Secondary | ICD-10-CM

## 2010-11-12 DIAGNOSIS — I10 Essential (primary) hypertension: Secondary | ICD-10-CM

## 2010-11-19 NOTE — Assessment & Plan Note (Signed)
Summary: 5 wk rov/kp   Primary Provider/Referring Provider:  Trula Slade  CC:  5 week follow up visit-OSA; wearing CPAP each and every night no complaints..  History of Present Illness: History of Present Illness: July 27, 2010- 54 yoF referred courtesy of Dr Fayrene Fearing Deterding with suspected sleep apnea.  She sleeps alone, but told she snores,  and is aware of some daytime sleepiness. During hospital stays this year for revision of a dialysis shunt and later for CABG, the nurses told her they witnessed apneas.  Wakes occasionally with cough, but not choked, smothering or w/ heartburn.Hands go numb in sleep.  Bedtime 1130-100AM, latency 1 hour, several short wakings but usually comfortable, up M-W-F at 415AM for dialysis, otw 830AM. Often not sleepy the night before dialysis and may get only 3-4 hours sleep those nights. Hx nasal bx for ?? in past but no other ENT surgery.  September 18, 2010- OSA/ CSA, ESRD Nurse-CC: Follow up visit-review sleep study Not aware of times when she finds she isn't taking breaths. NPSG reviewed wih her. Severe obstructive and central apnea. Failed CPAP titration and had not accomplished BIPAP control, mostly due to residual central apneas. We discussed sleep hygiene, treatment and medical significance, and available treatments.  November 12, 2010- OSA/ CSA, ESRD Nurse-CC:  CPAP titrated to 12. She is able to wear it all night every night. Denies daytime sleepiness. Lives alone with no reporter. Admits morning cough at times. Last CXR was July, 2011.     Preventive Screening-Counseling & Management  Alcohol-Tobacco     Alcohol drinks/day: 0     Smoking Status: never  Current Medications (verified): 1)  Dialyvite 800 0.8 Mg Tabs (B Complex-C-Folic Acid) .... Take 1 By Mouth Once Daily 2)  Sensipar 60 Mg Tabs (Cinacalcet Hcl) .... Take 1 By Mouth Once Daily 3)  Aspirin 325 Mg Tabs (Aspirin) .... Take 1 By Mouth Once Daily 4)  Lipitor 10 Mg Tabs (Atorvastatin  Calcium) .... Take 1 By Mouth Once Daily 5)  Midodrine Hcl 5 Mg Tabs (Midodrine Hcl) .... Take 2 At 5am On M, W, F 6)  Renvela 800 Mg Tabs (Sevelamer Carbonate) .... Take 3 By Mouth Three Times A Day 7)  Novolog Flexpen 100 Unit/ml Soln (Insulin Aspart) .... Silding Scale 8)  Lantus 100 Unit/ml Soln (Insulin Glargine) .... Take 8 Units Every Evening 9)  Super B-100  Tabs (B Complex-Biotin-Fa) .... Take 1 By Mouth Once Daily 10)  Tylenol 325 Mg Tabs (Acetaminophen) .... As Needed Bottle 11)  Cpap 12 Advanced  Allergies (verified): 1)  ! * Vytorin 2)  ! * Neurotin  Past History:  Past Medical History: Last updated: 09/18/2010 Diabetes II Kidney Disease- ESRD secondary to DM,  failed transplant 2006. Dialysis Secondary Hyperparathyroidism Obstructive sleep apnea- NPSG 11.23.11- AHI 64.9/hr- central and obstructive apnea Ischemic cardiomyopathy EF 25-30% 2011 CAD- CABG 2011 Pulmonary Hypertension Est PAsyst 60- Dr Mendel Ryder, off coumadin, CT neg for PE Hyperlipidemia Hypertension Anemia Systemic Lupus -08/08/07- Dr Phylliss Bob- Pred/ plaquenil  Past Surgical History: Last updated: 07/27/2010 Dialysis access CABG- 3V- 2011 Right AK amputation- infection/ gangrene 10/11/07 nasal mucosal biopsy Ovarian Cyst  Removal of failed cadaveric renal transplant 2006- wound infection laser retinal surgery -2011  Family History: Last updated: 07/27/2010 Family hx:: Heart Disease Father- died fluid overload Mother- died cerebral aneurysm Brother- hx MI  Social History: Last updated: 07/27/2010 Single; no children Lives alone Non Smoker Kidney dyalisis patient-Disabled No ETOH  Risk Factors: Alcohol Use:  0 (11/12/2010)  Risk Factors: Smoking Status: never (11/12/2010)  Review of Systems      See HPI  The patient denies shortness of breath with activity, shortness of breath at rest, productive cough, non-productive cough, coughing up blood, chest pain, irregular heartbeats, acid  heartburn, indigestion, loss of appetite, weight change, abdominal pain, difficulty swallowing, sore throat, tooth/dental problems, headaches, nasal congestion/difficulty breathing through nose, and sneezing.    Vital Signs:  Patient profile:   55 year old female Weight:      218 pounds O2 Sat:      94 % on Room air Pulse rate:   100 / minute BP sitting:   118 / 66  (right arm) Cuff size:   regular  Vitals Entered By: Reynaldo Minium CMA (November 12, 2010 9:49 AM)  O2 Flow:  Room air CC: 5 week follow up visit-OSA; wearing CPAP each and every night no complaints.   Physical Exam  Additional Exam:  General: A/Ox3; pleasant and cooperative, NAD, alert SKIN: no rash, lesions NODES: no lymphadenopathy HEENT: [place]/AT, EOM- WNL, Conjuctivae- R eye red, L strabismus, PERRLA, TM-WNL, Nose- clear, Throat- clear and wnl, Mallampati  III NECK: Supple w/ fair ROM, JVD- none, normal carotid impulses w/o bruits Thyroid- normal to palpation CHEST: Clear to P&A, but deep cough HEART: RRR, no m/g/r heard ABDOMEN: Soft and nl; nml bowel sounds; no organomegaly or masses noted ZOX:WRUE, nl pulses, no edema. Access Left upper arm, Right AKA. Cane.  NEURO: Grossly intact to observation      Impression & Recommendations:  Problem # 1:  OBSTRUCTIVE SLEEP APNEA (ICD-327.23)  good compliance and control on CPAP. She is satisfied now and seems prepared to continue with it.   Problem # 2:  HYPERTENSION (ICD-401.9)  Note good BP today and discussed relation of BP, heart and other problems potentially to sleep apnea.   Medications Added to Medication List This Visit: 1)  Sensipar 60 Mg Tabs (Cinacalcet hcl) .... Take 1 by mouth once daily 2)  Midodrine Hcl 5 Mg Tabs (Midodrine hcl) .... Take 2 at 5am on m, w, f  Other Orders: Est. Patient Level III (45409)  Patient Instructions: 1)  Please schedule a follow-up appointment in 6 months. To follow up on Sleep apnea 2)  Please call the home care  company for any CPAP problems. If they can't help, then call us.

## 2010-11-19 NOTE — Progress Notes (Signed)
Summary: CPAP autotitrated to 12  Phone Note Other Incoming   Summary of Call: Advanced- CPAP download- adequate control and compliance, with relatively litte Central Apnea, using CPAP 12.Will set that fixed.     New/Updated Medications: * CPAP 12 ADVANCED

## 2010-11-25 ENCOUNTER — Ambulatory Visit (HOSPITAL_COMMUNITY)
Admission: RE | Admit: 2010-11-25 | Discharge: 2010-11-25 | Disposition: A | Discharge status: Home / Self Care | Payer: Medicare Other | Source: Ambulatory Visit | Attending: Vascular Surgery | Admitting: Vascular Surgery

## 2010-11-25 ENCOUNTER — Ambulatory Visit (HOSPITAL_COMMUNITY): Payer: Medicare Other

## 2010-11-25 DIAGNOSIS — N186 End stage renal disease: Secondary | ICD-10-CM

## 2010-11-25 DIAGNOSIS — E119 Type 2 diabetes mellitus without complications: Secondary | ICD-10-CM | POA: Insufficient documentation

## 2010-11-25 DIAGNOSIS — I120 Hypertensive chronic kidney disease with stage 5 chronic kidney disease or end stage renal disease: Secondary | ICD-10-CM

## 2010-11-25 DIAGNOSIS — Z01818 Encounter for other preprocedural examination: Secondary | ICD-10-CM | POA: Insufficient documentation

## 2010-11-25 DIAGNOSIS — G4733 Obstructive sleep apnea (adult) (pediatric): Secondary | ICD-10-CM | POA: Insufficient documentation

## 2010-11-25 DIAGNOSIS — Y832 Surgical operation with anastomosis, bypass or graft as the cause of abnormal reaction of the patient, or of later complication, without mention of misadventure at the time of the procedure: Secondary | ICD-10-CM | POA: Insufficient documentation

## 2010-11-25 DIAGNOSIS — Z01812 Encounter for preprocedural laboratory examination: Secondary | ICD-10-CM | POA: Insufficient documentation

## 2010-11-25 DIAGNOSIS — Z794 Long term (current) use of insulin: Secondary | ICD-10-CM | POA: Insufficient documentation

## 2010-11-25 DIAGNOSIS — E669 Obesity, unspecified: Secondary | ICD-10-CM | POA: Insufficient documentation

## 2010-11-25 DIAGNOSIS — T82898A Other specified complication of vascular prosthetic devices, implants and grafts, initial encounter: Secondary | ICD-10-CM | POA: Insufficient documentation

## 2010-11-25 DIAGNOSIS — Z951 Presence of aortocoronary bypass graft: Secondary | ICD-10-CM | POA: Insufficient documentation

## 2010-11-25 DIAGNOSIS — I2510 Atherosclerotic heart disease of native coronary artery without angina pectoris: Secondary | ICD-10-CM | POA: Insufficient documentation

## 2010-11-25 LAB — SURGICAL PCR SCREEN: Staphylococcus aureus: POSITIVE — AB

## 2010-11-25 LAB — POCT I-STAT 4, (NA,K, GLUC, HGB,HCT)
Glucose, Bld: 181 mg/dL — ABNORMAL HIGH (ref 70–99)
HCT: 45 % (ref 36.0–46.0)
Hemoglobin: 15.3 g/dL — ABNORMAL HIGH (ref 12.0–15.0)

## 2010-11-25 LAB — GLUCOSE, CAPILLARY: Glucose-Capillary: 185 mg/dL — ABNORMAL HIGH (ref 70–99)

## 2010-11-28 LAB — CBC
HCT: 30.1 % — ABNORMAL LOW (ref 36.0–46.0)
HCT: 32.9 % — ABNORMAL LOW (ref 36.0–46.0)
HCT: 34.1 % — ABNORMAL LOW (ref 36.0–46.0)
HCT: 35.3 % — ABNORMAL LOW (ref 36.0–46.0)
Hemoglobin: 10.2 g/dL — ABNORMAL LOW (ref 12.0–15.0)
Hemoglobin: 10.7 g/dL — ABNORMAL LOW (ref 12.0–15.0)
Hemoglobin: 10.8 g/dL — ABNORMAL LOW (ref 12.0–15.0)
Hemoglobin: 11.3 g/dL — ABNORMAL LOW (ref 12.0–15.0)
Hemoglobin: 9.3 g/dL — ABNORMAL LOW (ref 12.0–15.0)
Hemoglobin: 9.4 g/dL — ABNORMAL LOW (ref 12.0–15.0)
MCH: 29.2 pg (ref 26.0–34.0)
MCH: 29.4 pg (ref 26.0–34.0)
MCH: 29.6 pg (ref 26.0–34.0)
MCH: 29.6 pg (ref 26.0–34.0)
MCH: 29.8 pg (ref 26.0–34.0)
MCH: 30.1 pg (ref 26.0–34.0)
MCHC: 31.1 g/dL (ref 30.0–36.0)
MCHC: 31.7 g/dL (ref 30.0–36.0)
MCHC: 31.9 g/dL (ref 30.0–36.0)
MCHC: 32 g/dL (ref 30.0–36.0)
MCHC: 32.1 g/dL (ref 30.0–36.0)
MCHC: 32.1 g/dL (ref 30.0–36.0)
MCV: 91.9 fL (ref 78.0–100.0)
MCV: 92.9 fL (ref 78.0–100.0)
MCV: 94 fL (ref 78.0–100.0)
MCV: 94.6 fL (ref 78.0–100.0)
Platelets: 153 10*3/uL (ref 150–400)
RBC: 3.15 MIL/uL — ABNORMAL LOW (ref 3.87–5.11)
RBC: 3.15 MIL/uL — ABNORMAL LOW (ref 3.87–5.11)
RBC: 3.48 MIL/uL — ABNORMAL LOW (ref 3.87–5.11)
RBC: 3.87 MIL/uL (ref 3.87–5.11)
RDW: 18.6 % — ABNORMAL HIGH (ref 11.5–15.5)
RDW: 18.7 % — ABNORMAL HIGH (ref 11.5–15.5)
RDW: 22.9 % — ABNORMAL HIGH (ref 11.5–15.5)
WBC: 10.8 10*3/uL — ABNORMAL HIGH (ref 4.0–10.5)
WBC: 4.4 10*3/uL (ref 4.0–10.5)
WBC: 5.5 10*3/uL (ref 4.0–10.5)
WBC: 7.2 10*3/uL (ref 4.0–10.5)

## 2010-11-28 LAB — RENAL FUNCTION PANEL
BUN: 17 mg/dL (ref 6–23)
BUN: 18 mg/dL (ref 6–23)
CO2: 22 mEq/L (ref 19–32)
CO2: 22 mEq/L (ref 19–32)
CO2: 24 mEq/L (ref 19–32)
CO2: 25 mEq/L (ref 19–32)
CO2: 26 mEq/L (ref 19–32)
Calcium: 7.5 mg/dL — ABNORMAL LOW (ref 8.4–10.5)
Calcium: 7.8 mg/dL — ABNORMAL LOW (ref 8.4–10.5)
Calcium: 8.4 mg/dL (ref 8.4–10.5)
Chloride: 94 mEq/L — ABNORMAL LOW (ref 96–112)
Chloride: 96 mEq/L (ref 96–112)
Chloride: 98 mEq/L (ref 96–112)
Chloride: 99 mEq/L (ref 96–112)
Creatinine, Ser: 5.92 mg/dL — ABNORMAL HIGH (ref 0.4–1.2)
Creatinine, Ser: 6.68 mg/dL — ABNORMAL HIGH (ref 0.4–1.2)
Creatinine, Ser: 7.06 mg/dL — ABNORMAL HIGH (ref 0.4–1.2)
Creatinine, Ser: 7.7 mg/dL — ABNORMAL HIGH (ref 0.4–1.2)
GFR calc Af Amer: 12 mL/min — ABNORMAL LOW (ref >60)
GFR calc Af Amer: 7 mL/min — ABNORMAL LOW (ref >60)
GFR calc Af Amer: 9 mL/min — ABNORMAL LOW (ref >60)
GFR calc Af Amer: 9 mL/min — ABNORMAL LOW (ref >60)
GFR calc non Af Amer: 10 mL/min — ABNORMAL LOW (ref >60)
GFR calc non Af Amer: 5 mL/min — ABNORMAL LOW (ref >60)
GFR calc non Af Amer: 7 mL/min — ABNORMAL LOW (ref >60)
GFR calc non Af Amer: 8 mL/min — ABNORMAL LOW (ref >60)
Glucose, Bld: 103 mg/dL — ABNORMAL HIGH (ref 70–99)
Glucose, Bld: 106 mg/dL — ABNORMAL HIGH (ref 70–99)
Glucose, Bld: 108 mg/dL — ABNORMAL HIGH (ref 70–99)
Glucose, Bld: 114 mg/dL — ABNORMAL HIGH (ref 70–99)
Glucose, Bld: 135 mg/dL — ABNORMAL HIGH (ref 70–99)
Phosphorus: 2.8 mg/dL (ref 2.3–4.6)
Potassium: 4 mEq/L (ref 3.5–5.1)
Potassium: 4.1 mEq/L (ref 3.5–5.1)
Potassium: 4.3 mEq/L (ref 3.5–5.1)
Sodium: 132 mEq/L — ABNORMAL LOW (ref 135–145)
Sodium: 134 mEq/L — ABNORMAL LOW (ref 135–145)
Sodium: 134 mEq/L — ABNORMAL LOW (ref 135–145)

## 2010-11-28 LAB — CK TOTAL AND CKMB (NOT AT ARMC)
CK, MB: 3.8 ng/mL (ref 0.3–4.0)
Relative Index: INVALID (ref 0.0–2.5)
Total CK: 65 U/L (ref 7–177)
Total CK: 75 U/L (ref 7–177)

## 2010-11-28 LAB — BRAIN NATRIURETIC PEPTIDE: Pro B Natriuretic peptide (BNP): 439 pg/mL — ABNORMAL HIGH (ref 0.0–100.0)

## 2010-11-28 LAB — PHOSPHORUS: Phosphorus: 6.4 mg/dL — ABNORMAL HIGH (ref 2.3–4.6)

## 2010-11-28 LAB — DIFFERENTIAL
Basophils Absolute: 0.1 10*3/uL (ref 0.0–0.1)
Eosinophils Absolute: 0.1 10*3/uL (ref 0.0–0.7)
Lymphs Abs: 1.6 10*3/uL (ref 0.7–4.0)
Monocytes Relative: 14 % — ABNORMAL HIGH (ref 3–12)
Neutro Abs: 4.4 10*3/uL (ref 1.7–7.7)

## 2010-11-28 LAB — COMPREHENSIVE METABOLIC PANEL
AST: 24 U/L (ref 0–37)
Alkaline Phosphatase: 96 U/L (ref 39–117)
BUN: 60 mg/dL — ABNORMAL HIGH (ref 6–23)
CO2: 20 mEq/L (ref 19–32)
Chloride: 98 mEq/L (ref 96–112)
Creatinine, Ser: 8.62 mg/dL — ABNORMAL HIGH (ref 0.4–1.2)
GFR calc Af Amer: 6 mL/min — ABNORMAL LOW (ref >60)
GFR calc non Af Amer: 5 mL/min — ABNORMAL LOW (ref >60)
Potassium: 5 mEq/L (ref 3.5–5.1)
Total Bilirubin: 1.6 mg/dL — ABNORMAL HIGH (ref 0.3–1.2)

## 2010-11-28 LAB — GLUCOSE, CAPILLARY
Glucose-Capillary: 107 mg/dL — ABNORMAL HIGH (ref 70–99)
Glucose-Capillary: 110 mg/dL — ABNORMAL HIGH (ref 70–99)
Glucose-Capillary: 114 mg/dL — ABNORMAL HIGH (ref 70–99)
Glucose-Capillary: 116 mg/dL — ABNORMAL HIGH (ref 70–99)
Glucose-Capillary: 118 mg/dL — ABNORMAL HIGH (ref 70–99)
Glucose-Capillary: 121 mg/dL — ABNORMAL HIGH (ref 70–99)
Glucose-Capillary: 122 mg/dL — ABNORMAL HIGH (ref 70–99)
Glucose-Capillary: 122 mg/dL — ABNORMAL HIGH (ref 70–99)
Glucose-Capillary: 126 mg/dL — ABNORMAL HIGH (ref 70–99)
Glucose-Capillary: 134 mg/dL — ABNORMAL HIGH (ref 70–99)
Glucose-Capillary: 134 mg/dL — ABNORMAL HIGH (ref 70–99)
Glucose-Capillary: 134 mg/dL — ABNORMAL HIGH (ref 70–99)
Glucose-Capillary: 137 mg/dL — ABNORMAL HIGH (ref 70–99)
Glucose-Capillary: 139 mg/dL — ABNORMAL HIGH (ref 70–99)
Glucose-Capillary: 149 mg/dL — ABNORMAL HIGH (ref 70–99)
Glucose-Capillary: 154 mg/dL — ABNORMAL HIGH (ref 70–99)
Glucose-Capillary: 154 mg/dL — ABNORMAL HIGH (ref 70–99)
Glucose-Capillary: 165 mg/dL — ABNORMAL HIGH (ref 70–99)
Glucose-Capillary: 177 mg/dL — ABNORMAL HIGH (ref 70–99)
Glucose-Capillary: 67 mg/dL — ABNORMAL LOW (ref 70–99)
Glucose-Capillary: 69 mg/dL — ABNORMAL LOW (ref 70–99)
Glucose-Capillary: 74 mg/dL (ref 70–99)
Glucose-Capillary: 76 mg/dL (ref 70–99)
Glucose-Capillary: 80 mg/dL (ref 70–99)
Glucose-Capillary: 85 mg/dL (ref 70–99)
Glucose-Capillary: 86 mg/dL (ref 70–99)
Glucose-Capillary: 86 mg/dL (ref 70–99)
Glucose-Capillary: 87 mg/dL (ref 70–99)
Glucose-Capillary: 89 mg/dL (ref 70–99)
Glucose-Capillary: 98 mg/dL (ref 70–99)

## 2010-11-28 LAB — TRIGLYCERIDES: Triglycerides: 220 mg/dL — ABNORMAL HIGH (ref <150)

## 2010-11-29 LAB — GLUCOSE, CAPILLARY
Glucose-Capillary: 100 mg/dL — ABNORMAL HIGH (ref 70–99)
Glucose-Capillary: 101 mg/dL — ABNORMAL HIGH (ref 70–99)
Glucose-Capillary: 105 mg/dL — ABNORMAL HIGH (ref 70–99)
Glucose-Capillary: 107 mg/dL — ABNORMAL HIGH (ref 70–99)
Glucose-Capillary: 109 mg/dL — ABNORMAL HIGH (ref 70–99)
Glucose-Capillary: 110 mg/dL — ABNORMAL HIGH (ref 70–99)
Glucose-Capillary: 112 mg/dL — ABNORMAL HIGH (ref 70–99)
Glucose-Capillary: 119 mg/dL — ABNORMAL HIGH (ref 70–99)
Glucose-Capillary: 120 mg/dL — ABNORMAL HIGH (ref 70–99)
Glucose-Capillary: 122 mg/dL — ABNORMAL HIGH (ref 70–99)
Glucose-Capillary: 125 mg/dL — ABNORMAL HIGH (ref 70–99)
Glucose-Capillary: 126 mg/dL — ABNORMAL HIGH (ref 70–99)
Glucose-Capillary: 126 mg/dL — ABNORMAL HIGH (ref 70–99)
Glucose-Capillary: 127 mg/dL — ABNORMAL HIGH (ref 70–99)
Glucose-Capillary: 132 mg/dL — ABNORMAL HIGH (ref 70–99)
Glucose-Capillary: 135 mg/dL — ABNORMAL HIGH (ref 70–99)
Glucose-Capillary: 135 mg/dL — ABNORMAL HIGH (ref 70–99)
Glucose-Capillary: 138 mg/dL — ABNORMAL HIGH (ref 70–99)
Glucose-Capillary: 139 mg/dL — ABNORMAL HIGH (ref 70–99)
Glucose-Capillary: 140 mg/dL — ABNORMAL HIGH (ref 70–99)
Glucose-Capillary: 140 mg/dL — ABNORMAL HIGH (ref 70–99)
Glucose-Capillary: 140 mg/dL — ABNORMAL HIGH (ref 70–99)
Glucose-Capillary: 141 mg/dL — ABNORMAL HIGH (ref 70–99)
Glucose-Capillary: 143 mg/dL — ABNORMAL HIGH (ref 70–99)
Glucose-Capillary: 143 mg/dL — ABNORMAL HIGH (ref 70–99)
Glucose-Capillary: 144 mg/dL — ABNORMAL HIGH (ref 70–99)
Glucose-Capillary: 145 mg/dL — ABNORMAL HIGH (ref 70–99)
Glucose-Capillary: 146 mg/dL — ABNORMAL HIGH (ref 70–99)
Glucose-Capillary: 147 mg/dL — ABNORMAL HIGH (ref 70–99)
Glucose-Capillary: 149 mg/dL — ABNORMAL HIGH (ref 70–99)
Glucose-Capillary: 149 mg/dL — ABNORMAL HIGH (ref 70–99)
Glucose-Capillary: 150 mg/dL — ABNORMAL HIGH (ref 70–99)
Glucose-Capillary: 151 mg/dL — ABNORMAL HIGH (ref 70–99)
Glucose-Capillary: 152 mg/dL — ABNORMAL HIGH (ref 70–99)
Glucose-Capillary: 157 mg/dL — ABNORMAL HIGH (ref 70–99)
Glucose-Capillary: 160 mg/dL — ABNORMAL HIGH (ref 70–99)
Glucose-Capillary: 161 mg/dL — ABNORMAL HIGH (ref 70–99)
Glucose-Capillary: 161 mg/dL — ABNORMAL HIGH (ref 70–99)
Glucose-Capillary: 164 mg/dL — ABNORMAL HIGH (ref 70–99)
Glucose-Capillary: 164 mg/dL — ABNORMAL HIGH (ref 70–99)
Glucose-Capillary: 165 mg/dL — ABNORMAL HIGH (ref 70–99)
Glucose-Capillary: 166 mg/dL — ABNORMAL HIGH (ref 70–99)
Glucose-Capillary: 169 mg/dL — ABNORMAL HIGH (ref 70–99)
Glucose-Capillary: 170 mg/dL — ABNORMAL HIGH (ref 70–99)
Glucose-Capillary: 173 mg/dL — ABNORMAL HIGH (ref 70–99)
Glucose-Capillary: 174 mg/dL — ABNORMAL HIGH (ref 70–99)
Glucose-Capillary: 174 mg/dL — ABNORMAL HIGH (ref 70–99)
Glucose-Capillary: 175 mg/dL — ABNORMAL HIGH (ref 70–99)
Glucose-Capillary: 177 mg/dL — ABNORMAL HIGH (ref 70–99)
Glucose-Capillary: 179 mg/dL — ABNORMAL HIGH (ref 70–99)
Glucose-Capillary: 179 mg/dL — ABNORMAL HIGH (ref 70–99)
Glucose-Capillary: 183 mg/dL — ABNORMAL HIGH (ref 70–99)
Glucose-Capillary: 183 mg/dL — ABNORMAL HIGH (ref 70–99)
Glucose-Capillary: 184 mg/dL — ABNORMAL HIGH (ref 70–99)
Glucose-Capillary: 190 mg/dL — ABNORMAL HIGH (ref 70–99)
Glucose-Capillary: 190 mg/dL — ABNORMAL HIGH (ref 70–99)
Glucose-Capillary: 191 mg/dL — ABNORMAL HIGH (ref 70–99)
Glucose-Capillary: 198 mg/dL — ABNORMAL HIGH (ref 70–99)
Glucose-Capillary: 198 mg/dL — ABNORMAL HIGH (ref 70–99)
Glucose-Capillary: 206 mg/dL — ABNORMAL HIGH (ref 70–99)
Glucose-Capillary: 207 mg/dL — ABNORMAL HIGH (ref 70–99)
Glucose-Capillary: 208 mg/dL — ABNORMAL HIGH (ref 70–99)
Glucose-Capillary: 210 mg/dL — ABNORMAL HIGH (ref 70–99)
Glucose-Capillary: 217 mg/dL — ABNORMAL HIGH (ref 70–99)
Glucose-Capillary: 221 mg/dL — ABNORMAL HIGH (ref 70–99)
Glucose-Capillary: 222 mg/dL — ABNORMAL HIGH (ref 70–99)
Glucose-Capillary: 228 mg/dL — ABNORMAL HIGH (ref 70–99)
Glucose-Capillary: 230 mg/dL — ABNORMAL HIGH (ref 70–99)
Glucose-Capillary: 230 mg/dL — ABNORMAL HIGH (ref 70–99)
Glucose-Capillary: 239 mg/dL — ABNORMAL HIGH (ref 70–99)
Glucose-Capillary: 248 mg/dL — ABNORMAL HIGH (ref 70–99)
Glucose-Capillary: 251 mg/dL — ABNORMAL HIGH (ref 70–99)
Glucose-Capillary: 258 mg/dL — ABNORMAL HIGH (ref 70–99)
Glucose-Capillary: 269 mg/dL — ABNORMAL HIGH (ref 70–99)
Glucose-Capillary: 279 mg/dL — ABNORMAL HIGH (ref 70–99)
Glucose-Capillary: 88 mg/dL (ref 70–99)
Glucose-Capillary: 90 mg/dL (ref 70–99)
Glucose-Capillary: 90 mg/dL (ref 70–99)
Glucose-Capillary: 91 mg/dL (ref 70–99)
Glucose-Capillary: 92 mg/dL (ref 70–99)
Glucose-Capillary: 93 mg/dL (ref 70–99)
Glucose-Capillary: 99 mg/dL (ref 70–99)
Glucose-Capillary: 99 mg/dL (ref 70–99)

## 2010-11-29 LAB — POCT I-STAT 3, ART BLOOD GAS (G3+)
Acid-Base Excess: 2 mmol/L (ref 0.0–2.0)
Acid-base deficit: 4 mmol/L — ABNORMAL HIGH (ref 0.0–2.0)
Acid-base deficit: 5 mmol/L — ABNORMAL HIGH (ref 0.0–2.0)
Bicarbonate: 25.4 mEq/L — ABNORMAL HIGH (ref 20.0–24.0)
O2 Saturation: 94 %
O2 Saturation: 96 %
O2 Saturation: 96 %
Patient temperature: 100.6
Patient temperature: 36.4
Patient temperature: 97
Patient temperature: 97.8
TCO2: 24 mmol/L (ref 0–100)
TCO2: 27 mmol/L (ref 0–100)
TCO2: 28 mmol/L (ref 0–100)
pCO2 arterial: 41.2 mmHg (ref 35.0–45.0)
pCO2 arterial: 42.3 mmHg (ref 35.0–45.0)
pH, Arterial: 7.312 — ABNORMAL LOW (ref 7.350–7.400)
pH, Arterial: 7.413 — ABNORMAL HIGH (ref 7.350–7.400)
pO2, Arterial: 79 mmHg — ABNORMAL LOW (ref 80.0–100.0)

## 2010-11-29 LAB — PHOSPHORUS
Phosphorus: 2 mg/dL — ABNORMAL LOW (ref 2.3–4.6)
Phosphorus: 2.8 mg/dL (ref 2.3–4.6)
Phosphorus: 3 mg/dL (ref 2.3–4.6)
Phosphorus: 3.5 mg/dL (ref 2.3–4.6)

## 2010-11-29 LAB — CBC
HCT: 24.4 % — ABNORMAL LOW (ref 36.0–46.0)
HCT: 25.5 % — ABNORMAL LOW (ref 36.0–46.0)
HCT: 25.5 % — ABNORMAL LOW (ref 36.0–46.0)
HCT: 26.4 % — ABNORMAL LOW (ref 36.0–46.0)
HCT: 28.3 % — ABNORMAL LOW (ref 36.0–46.0)
HCT: 29.8 % — ABNORMAL LOW (ref 36.0–46.0)
Hemoglobin: 10.2 g/dL — ABNORMAL LOW (ref 12.0–15.0)
Hemoglobin: 11.3 g/dL — ABNORMAL LOW (ref 12.0–15.0)
Hemoglobin: 8.3 g/dL — ABNORMAL LOW (ref 12.0–15.0)
Hemoglobin: 8.5 g/dL — ABNORMAL LOW (ref 12.0–15.0)
Hemoglobin: 9.1 g/dL — ABNORMAL LOW (ref 12.0–15.0)
MCH: 29.6 pg (ref 26.0–34.0)
MCH: 29.6 pg (ref 26.0–34.0)
MCH: 29.7 pg (ref 26.0–34.0)
MCH: 29.8 pg (ref 26.0–34.0)
MCH: 30.4 pg (ref 26.0–34.0)
MCH: 30.6 pg (ref 26.0–34.0)
MCH: 30.6 pg (ref 26.0–34.0)
MCH: 30.7 pg (ref 26.0–34.0)
MCHC: 32.7 g/dL (ref 30.0–36.0)
MCHC: 32.8 g/dL (ref 30.0–36.0)
MCHC: 33 g/dL (ref 30.0–36.0)
MCHC: 33.1 g/dL (ref 30.0–36.0)
MCHC: 33.3 g/dL (ref 30.0–36.0)
MCHC: 33.7 g/dL (ref 30.0–36.0)
MCV: 90.3 fL (ref 78.0–100.0)
MCV: 90.7 fL (ref 78.0–100.0)
MCV: 90.8 fL (ref 78.0–100.0)
MCV: 91.6 fL (ref 78.0–100.0)
MCV: 92 fL (ref 78.0–100.0)
MCV: 92 fL (ref 78.0–100.0)
MCV: 92.1 fL (ref 78.0–100.0)
MCV: 93.4 fL (ref 78.0–100.0)
MCV: 93.9 fL (ref 78.0–100.0)
Platelets: 107 10*3/uL — ABNORMAL LOW (ref 150–400)
Platelets: 118 10*3/uL — ABNORMAL LOW (ref 150–400)
Platelets: 133 10*3/uL — ABNORMAL LOW (ref 150–400)
Platelets: 135 10*3/uL — ABNORMAL LOW (ref 150–400)
Platelets: 165 10*3/uL (ref 150–400)
Platelets: 167 10*3/uL (ref 150–400)
Platelets: 206 10*3/uL (ref 150–400)
Platelets: 216 10*3/uL (ref 150–400)
Platelets: 257 10*3/uL (ref 150–400)
RBC: 2.65 MIL/uL — ABNORMAL LOW (ref 3.87–5.11)
RBC: 2.66 MIL/uL — ABNORMAL LOW (ref 3.87–5.11)
RBC: 2.67 MIL/uL — ABNORMAL LOW (ref 3.87–5.11)
RBC: 2.73 MIL/uL — ABNORMAL LOW (ref 3.87–5.11)
RBC: 2.81 MIL/uL — ABNORMAL LOW (ref 3.87–5.11)
RBC: 2.83 MIL/uL — ABNORMAL LOW (ref 3.87–5.11)
RBC: 3.01 MIL/uL — ABNORMAL LOW (ref 3.87–5.11)
RBC: 3.17 MIL/uL — ABNORMAL LOW (ref 3.87–5.11)
RBC: 3.43 MIL/uL — ABNORMAL LOW (ref 3.87–5.11)
RBC: 3.79 MIL/uL — ABNORMAL LOW (ref 3.87–5.11)
RDW: 17.5 % — ABNORMAL HIGH (ref 11.5–15.5)
RDW: 17.7 % — ABNORMAL HIGH (ref 11.5–15.5)
RDW: 17.9 % — ABNORMAL HIGH (ref 11.5–15.5)
RDW: 18.8 % — ABNORMAL HIGH (ref 11.5–15.5)
RDW: 19.5 % — ABNORMAL HIGH (ref 11.5–15.5)
RDW: 22 % — ABNORMAL HIGH (ref 11.5–15.5)
WBC: 16.6 10*3/uL — ABNORMAL HIGH (ref 4.0–10.5)
WBC: 18.1 10*3/uL — ABNORMAL HIGH (ref 4.0–10.5)
WBC: 20.1 10*3/uL — ABNORMAL HIGH (ref 4.0–10.5)
WBC: 20.4 10*3/uL — ABNORMAL HIGH (ref 4.0–10.5)
WBC: 25.5 10*3/uL — ABNORMAL HIGH (ref 4.0–10.5)
WBC: 28.2 10*3/uL — ABNORMAL HIGH (ref 4.0–10.5)
WBC: 33.1 10*3/uL — ABNORMAL HIGH (ref 4.0–10.5)
WBC: 37 10*3/uL — ABNORMAL HIGH (ref 4.0–10.5)

## 2010-11-29 LAB — POCT I-STAT, CHEM 8
BUN: 36 mg/dL — ABNORMAL HIGH (ref 6–23)
Chloride: 105 mEq/L (ref 96–112)
Creatinine, Ser: 2.1 mg/dL — ABNORMAL HIGH (ref 0.4–1.2)
Potassium: 5 mEq/L (ref 3.5–5.1)
Sodium: 132 mEq/L — ABNORMAL LOW (ref 135–145)

## 2010-11-29 LAB — COMPREHENSIVE METABOLIC PANEL
ALT: 33 U/L (ref 0–35)
ALT: 47 U/L — ABNORMAL HIGH (ref 0–35)
AST: 43 U/L — ABNORMAL HIGH (ref 0–37)
Albumin: 1.9 g/dL — ABNORMAL LOW (ref 3.5–5.2)
Albumin: 2.3 g/dL — ABNORMAL LOW (ref 3.5–5.2)
Albumin: 2.6 g/dL — ABNORMAL LOW (ref 3.5–5.2)
Alkaline Phosphatase: 100 U/L (ref 39–117)
Alkaline Phosphatase: 86 U/L (ref 39–117)
Alkaline Phosphatase: 88 U/L (ref 39–117)
BUN: 17 mg/dL (ref 6–23)
BUN: 25 mg/dL — ABNORMAL HIGH (ref 6–23)
BUN: 26 mg/dL — ABNORMAL HIGH (ref 6–23)
BUN: 30 mg/dL — ABNORMAL HIGH (ref 6–23)
CO2: 22 mEq/L (ref 19–32)
CO2: 23 mEq/L (ref 19–32)
CO2: 24 mEq/L (ref 19–32)
Calcium: 9 mg/dL (ref 8.4–10.5)
Calcium: 9.2 mg/dL (ref 8.4–10.5)
Chloride: 102 mEq/L (ref 96–112)
Chloride: 102 mEq/L (ref 96–112)
Chloride: 103 mEq/L (ref 96–112)
Chloride: 97 mEq/L (ref 96–112)
Creatinine, Ser: 1.86 mg/dL — ABNORMAL HIGH (ref 0.4–1.2)
Creatinine, Ser: 1.88 mg/dL — ABNORMAL HIGH (ref 0.4–1.2)
Creatinine, Ser: 3.3 mg/dL — ABNORMAL HIGH (ref 0.4–1.2)
GFR calc Af Amer: 37 mL/min — ABNORMAL LOW (ref >60)
GFR calc non Af Amer: 15 mL/min — ABNORMAL LOW (ref >60)
GFR calc non Af Amer: 21 mL/min — ABNORMAL LOW (ref >60)
GFR calc non Af Amer: 31 mL/min — ABNORMAL LOW (ref >60)
Glucose, Bld: 113 mg/dL — ABNORMAL HIGH (ref 70–99)
Glucose, Bld: 162 mg/dL — ABNORMAL HIGH (ref 70–99)
Glucose, Bld: 273 mg/dL — ABNORMAL HIGH (ref 70–99)
Potassium: 4.1 mEq/L (ref 3.5–5.1)
Potassium: 4.2 mEq/L (ref 3.5–5.1)
Potassium: 4.3 mEq/L (ref 3.5–5.1)
Sodium: 129 mEq/L — ABNORMAL LOW (ref 135–145)
Sodium: 134 mEq/L — ABNORMAL LOW (ref 135–145)
Total Bilirubin: 1.6 mg/dL — ABNORMAL HIGH (ref 0.3–1.2)
Total Bilirubin: 1.8 mg/dL — ABNORMAL HIGH (ref 0.3–1.2)
Total Bilirubin: 2.2 mg/dL — ABNORMAL HIGH (ref 0.3–1.2)
Total Bilirubin: 3.4 mg/dL — ABNORMAL HIGH (ref 0.3–1.2)
Total Protein: 6.4 g/dL (ref 6.0–8.3)
Total Protein: 7.1 g/dL (ref 6.0–8.3)
Total Protein: 7.7 g/dL (ref 6.0–8.3)

## 2010-11-29 LAB — RENAL FUNCTION PANEL
Albumin: 2.2 g/dL — ABNORMAL LOW (ref 3.5–5.2)
Albumin: 2.2 g/dL — ABNORMAL LOW (ref 3.5–5.2)
Albumin: 2.3 g/dL — ABNORMAL LOW (ref 3.5–5.2)
Albumin: 2.3 g/dL — ABNORMAL LOW (ref 3.5–5.2)
Albumin: 2.4 g/dL — ABNORMAL LOW (ref 3.5–5.2)
Albumin: 2.4 g/dL — ABNORMAL LOW (ref 3.5–5.2)
Albumin: 2.5 g/dL — ABNORMAL LOW (ref 3.5–5.2)
BUN: 12 mg/dL (ref 6–23)
BUN: 15 mg/dL (ref 6–23)
BUN: 17 mg/dL (ref 6–23)
BUN: 19 mg/dL (ref 6–23)
BUN: 26 mg/dL — ABNORMAL HIGH (ref 6–23)
BUN: 26 mg/dL — ABNORMAL HIGH (ref 6–23)
BUN: 29 mg/dL — ABNORMAL HIGH (ref 6–23)
BUN: 31 mg/dL — ABNORMAL HIGH (ref 6–23)
BUN: 32 mg/dL — ABNORMAL HIGH (ref 6–23)
BUN: 45 mg/dL — ABNORMAL HIGH (ref 6–23)
BUN: 8 mg/dL (ref 6–23)
CO2: 22 mEq/L (ref 19–32)
CO2: 24 mEq/L (ref 19–32)
CO2: 25 mEq/L (ref 19–32)
CO2: 25 mEq/L (ref 19–32)
CO2: 26 mEq/L (ref 19–32)
CO2: 27 mEq/L (ref 19–32)
Calcium: 8.2 mg/dL — ABNORMAL LOW (ref 8.4–10.5)
Calcium: 8.3 mg/dL — ABNORMAL LOW (ref 8.4–10.5)
Calcium: 8.7 mg/dL (ref 8.4–10.5)
Calcium: 8.8 mg/dL (ref 8.4–10.5)
Calcium: 8.9 mg/dL (ref 8.4–10.5)
Calcium: 9 mg/dL (ref 8.4–10.5)
Calcium: 9.1 mg/dL (ref 8.4–10.5)
Calcium: 9.3 mg/dL (ref 8.4–10.5)
Calcium: 9.5 mg/dL (ref 8.4–10.5)
Calcium: 9.6 mg/dL (ref 8.4–10.5)
Calcium: 9.7 mg/dL (ref 8.4–10.5)
Chloride: 100 mEq/L (ref 96–112)
Chloride: 100 mEq/L (ref 96–112)
Chloride: 101 mEq/L (ref 96–112)
Chloride: 98 mEq/L (ref 96–112)
Creatinine, Ser: 1.66 mg/dL — ABNORMAL HIGH (ref 0.4–1.2)
Creatinine, Ser: 1.75 mg/dL — ABNORMAL HIGH (ref 0.4–1.2)
Creatinine, Ser: 1.78 mg/dL — ABNORMAL HIGH (ref 0.4–1.2)
Creatinine, Ser: 2.29 mg/dL — ABNORMAL HIGH (ref 0.4–1.2)
Creatinine, Ser: 3.13 mg/dL — ABNORMAL HIGH (ref 0.4–1.2)
GFR calc Af Amer: 25 mL/min — ABNORMAL LOW (ref >60)
GFR calc Af Amer: 27 mL/min — ABNORMAL LOW (ref >60)
GFR calc Af Amer: 29 mL/min — ABNORMAL LOW (ref >60)
GFR calc Af Amer: 34 mL/min — ABNORMAL LOW (ref >60)
GFR calc Af Amer: 34 mL/min — ABNORMAL LOW (ref >60)
GFR calc Af Amer: 37 mL/min — ABNORMAL LOW (ref >60)
GFR calc Af Amer: 39 mL/min — ABNORMAL LOW (ref >60)
GFR calc non Af Amer: 21 mL/min — ABNORMAL LOW (ref >60)
GFR calc non Af Amer: 22 mL/min — ABNORMAL LOW (ref >60)
GFR calc non Af Amer: 24 mL/min — ABNORMAL LOW (ref >60)
GFR calc non Af Amer: 28 mL/min — ABNORMAL LOW (ref >60)
GFR calc non Af Amer: 30 mL/min — ABNORMAL LOW (ref >60)
GFR calc non Af Amer: 33 mL/min — ABNORMAL LOW (ref >60)
Glucose, Bld: 121 mg/dL — ABNORMAL HIGH (ref 70–99)
Glucose, Bld: 127 mg/dL — ABNORMAL HIGH (ref 70–99)
Glucose, Bld: 161 mg/dL — ABNORMAL HIGH (ref 70–99)
Glucose, Bld: 191 mg/dL — ABNORMAL HIGH (ref 70–99)
Glucose, Bld: 194 mg/dL — ABNORMAL HIGH (ref 70–99)
Glucose, Bld: 230 mg/dL — ABNORMAL HIGH (ref 70–99)
Glucose, Bld: 237 mg/dL — ABNORMAL HIGH (ref 70–99)
Glucose, Bld: 253 mg/dL — ABNORMAL HIGH (ref 70–99)
Phosphorus: 1.9 mg/dL — ABNORMAL LOW (ref 2.3–4.6)
Phosphorus: 2.2 mg/dL — ABNORMAL LOW (ref 2.3–4.6)
Phosphorus: 2.5 mg/dL (ref 2.3–4.6)
Phosphorus: 2.5 mg/dL (ref 2.3–4.6)
Phosphorus: 2.6 mg/dL (ref 2.3–4.6)
Phosphorus: 2.9 mg/dL (ref 2.3–4.6)
Phosphorus: 3 mg/dL (ref 2.3–4.6)
Phosphorus: 3.1 mg/dL (ref 2.3–4.6)
Phosphorus: 3.1 mg/dL (ref 2.3–4.6)
Phosphorus: 3.2 mg/dL (ref 2.3–4.6)
Phosphorus: 3.2 mg/dL (ref 2.3–4.6)
Phosphorus: 3.5 mg/dL (ref 2.3–4.6)
Phosphorus: 3.7 mg/dL (ref 2.3–4.6)
Potassium: 3.2 mEq/L — ABNORMAL LOW (ref 3.5–5.1)
Potassium: 3.4 mEq/L — ABNORMAL LOW (ref 3.5–5.1)
Potassium: 3.7 mEq/L (ref 3.5–5.1)
Potassium: 3.7 mEq/L (ref 3.5–5.1)
Potassium: 3.9 mEq/L (ref 3.5–5.1)
Potassium: 4.1 mEq/L (ref 3.5–5.1)
Potassium: 4.1 mEq/L (ref 3.5–5.1)
Potassium: 4.1 mEq/L (ref 3.5–5.1)
Potassium: 4.2 mEq/L (ref 3.5–5.1)
Potassium: 4.2 mEq/L (ref 3.5–5.1)
Sodium: 128 mEq/L — ABNORMAL LOW (ref 135–145)
Sodium: 130 mEq/L — ABNORMAL LOW (ref 135–145)
Sodium: 130 mEq/L — ABNORMAL LOW (ref 135–145)
Sodium: 131 mEq/L — ABNORMAL LOW (ref 135–145)
Sodium: 131 mEq/L — ABNORMAL LOW (ref 135–145)
Sodium: 131 mEq/L — ABNORMAL LOW (ref 135–145)
Sodium: 132 mEq/L — ABNORMAL LOW (ref 135–145)
Sodium: 133 mEq/L — ABNORMAL LOW (ref 135–145)

## 2010-11-29 LAB — LACTIC ACID, PLASMA: Lactic Acid, Venous: 1.2 mmol/L (ref 0.5–2.2)

## 2010-11-29 LAB — MAGNESIUM
Magnesium: 2.2 mg/dL (ref 1.5–2.5)
Magnesium: 2.2 mg/dL (ref 1.5–2.5)
Magnesium: 2.2 mg/dL (ref 1.5–2.5)
Magnesium: 2.3 mg/dL (ref 1.5–2.5)
Magnesium: 2.3 mg/dL (ref 1.5–2.5)
Magnesium: 2.4 mg/dL (ref 1.5–2.5)
Magnesium: 2.8 mg/dL — ABNORMAL HIGH (ref 1.5–2.5)

## 2010-11-29 LAB — APTT
aPTT: 29 seconds (ref 24–37)
aPTT: 32 seconds (ref 24–37)
aPTT: 37 seconds (ref 24–37)
aPTT: 37 seconds (ref 24–37)
aPTT: 40 seconds — ABNORMAL HIGH (ref 24–37)
aPTT: 42 seconds — ABNORMAL HIGH (ref 24–37)

## 2010-11-29 LAB — CULTURE, BLOOD (ROUTINE X 2)
Culture: NO GROWTH
Culture: NO GROWTH
Culture: NO GROWTH

## 2010-11-29 LAB — BASIC METABOLIC PANEL
BUN: 97 mg/dL — ABNORMAL HIGH (ref 6–23)
CO2: 19 mEq/L (ref 19–32)
Chloride: 97 mEq/L (ref 96–112)
GFR calc Af Amer: 11 mL/min — ABNORMAL LOW (ref >60)
Potassium: 3.9 mEq/L (ref 3.5–5.1)

## 2010-11-29 LAB — CROSSMATCH

## 2010-11-29 LAB — DIFFERENTIAL
Basophils Absolute: 0 10*3/uL (ref 0.0–0.1)
Basophils Relative: 0 % (ref 0–1)
Eosinophils Absolute: 0 10*3/uL (ref 0.0–0.7)
Eosinophils Relative: 0 % (ref 0–5)
Eosinophils Relative: 0 % (ref 0–5)
Lymphocytes Relative: 8 % — ABNORMAL LOW (ref 12–46)
Monocytes Relative: 12 % (ref 3–12)
Neutro Abs: 30.3 10*3/uL — ABNORMAL HIGH (ref 1.7–7.7)
Neutrophils Relative %: 81 % — ABNORMAL HIGH (ref 43–77)

## 2010-11-29 LAB — CULTURE, BLOOD (SINGLE): Culture: NO GROWTH

## 2010-11-29 LAB — URINE MICROSCOPIC-ADD ON

## 2010-11-29 LAB — TSH: TSH: 1.56 u[IU]/mL (ref 0.350–4.500)

## 2010-11-29 LAB — BRAIN NATRIURETIC PEPTIDE: Pro B Natriuretic peptide (BNP): 330 pg/mL — ABNORMAL HIGH (ref 0.0–100.0)

## 2010-11-29 LAB — URINALYSIS, ROUTINE W REFLEX MICROSCOPIC
Bilirubin Urine: NEGATIVE
Ketones, ur: NEGATIVE mg/dL
Nitrite: POSITIVE — AB
Specific Gravity, Urine: 1.015 (ref 1.005–1.030)
Urobilinogen, UA: 0.2 mg/dL (ref 0.0–1.0)

## 2010-11-29 LAB — CORTISOL
Cortisol, Plasma: 14.6 ug/dL
Cortisol, Plasma: 24.6 ug/dL

## 2010-11-29 LAB — CULTURE, RESPIRATORY W GRAM STAIN

## 2010-11-29 LAB — PREALBUMIN
Prealbumin: 25.1 mg/dL (ref 18.0–45.0)
Prealbumin: 7 mg/dL — ABNORMAL LOW (ref 18.0–45.0)

## 2010-11-29 LAB — CHOLESTEROL, TOTAL: Cholesterol: 158 mg/dL (ref 0–200)

## 2010-11-29 LAB — TRIGLYCERIDES: Triglycerides: 210 mg/dL — ABNORMAL HIGH (ref <150)

## 2010-11-30 LAB — GLUCOSE, CAPILLARY
Glucose-Capillary: 100 mg/dL — ABNORMAL HIGH (ref 70–99)
Glucose-Capillary: 103 mg/dL — ABNORMAL HIGH (ref 70–99)
Glucose-Capillary: 103 mg/dL — ABNORMAL HIGH (ref 70–99)
Glucose-Capillary: 119 mg/dL — ABNORMAL HIGH (ref 70–99)
Glucose-Capillary: 123 mg/dL — ABNORMAL HIGH (ref 70–99)
Glucose-Capillary: 126 mg/dL — ABNORMAL HIGH (ref 70–99)
Glucose-Capillary: 140 mg/dL — ABNORMAL HIGH (ref 70–99)
Glucose-Capillary: 140 mg/dL — ABNORMAL HIGH (ref 70–99)
Glucose-Capillary: 142 mg/dL — ABNORMAL HIGH (ref 70–99)
Glucose-Capillary: 145 mg/dL — ABNORMAL HIGH (ref 70–99)
Glucose-Capillary: 149 mg/dL — ABNORMAL HIGH (ref 70–99)
Glucose-Capillary: 157 mg/dL — ABNORMAL HIGH (ref 70–99)
Glucose-Capillary: 159 mg/dL — ABNORMAL HIGH (ref 70–99)
Glucose-Capillary: 160 mg/dL — ABNORMAL HIGH (ref 70–99)
Glucose-Capillary: 220 mg/dL — ABNORMAL HIGH (ref 70–99)
Glucose-Capillary: 255 mg/dL — ABNORMAL HIGH (ref 70–99)
Glucose-Capillary: 54 mg/dL — ABNORMAL LOW (ref 70–99)
Glucose-Capillary: 60 mg/dL — ABNORMAL LOW (ref 70–99)
Glucose-Capillary: 62 mg/dL — ABNORMAL LOW (ref 70–99)
Glucose-Capillary: 66 mg/dL — ABNORMAL LOW (ref 70–99)
Glucose-Capillary: 69 mg/dL — ABNORMAL LOW (ref 70–99)
Glucose-Capillary: 73 mg/dL (ref 70–99)
Glucose-Capillary: 82 mg/dL (ref 70–99)
Glucose-Capillary: 82 mg/dL (ref 70–99)
Glucose-Capillary: 86 mg/dL (ref 70–99)
Glucose-Capillary: 90 mg/dL (ref 70–99)
Glucose-Capillary: 92 mg/dL (ref 70–99)
Glucose-Capillary: 94 mg/dL (ref 70–99)
Glucose-Capillary: 94 mg/dL (ref 70–99)

## 2010-11-30 LAB — CBC
HCT: 26 % — ABNORMAL LOW (ref 36.0–46.0)
HCT: 28.4 % — ABNORMAL LOW (ref 36.0–46.0)
HCT: 29.9 % — ABNORMAL LOW (ref 36.0–46.0)
HCT: 32.4 % — ABNORMAL LOW (ref 36.0–46.0)
HCT: 48 % — ABNORMAL HIGH (ref 36.0–46.0)
Hemoglobin: 10.8 g/dL — ABNORMAL LOW (ref 12.0–15.0)
Hemoglobin: 16.2 g/dL — ABNORMAL HIGH (ref 12.0–15.0)
Hemoglobin: 8.7 g/dL — ABNORMAL LOW (ref 12.0–15.0)
Hemoglobin: 9 g/dL — ABNORMAL LOW (ref 12.0–15.0)
Hemoglobin: 9.4 g/dL — ABNORMAL LOW (ref 12.0–15.0)
Hemoglobin: 9.5 g/dL — ABNORMAL LOW (ref 12.0–15.0)
Hemoglobin: 9.6 g/dL — ABNORMAL LOW (ref 12.0–15.0)
Hemoglobin: 9.9 g/dL — ABNORMAL LOW (ref 12.0–15.0)
MCH: 30.4 pg (ref 26.0–34.0)
MCH: 30.5 pg (ref 26.0–34.0)
MCH: 30.6 pg (ref 26.0–34.0)
MCH: 30.7 pg (ref 26.0–34.0)
MCH: 30.7 pg (ref 26.0–34.0)
MCHC: 32.8 g/dL (ref 30.0–36.0)
MCHC: 33.2 g/dL (ref 30.0–36.0)
MCHC: 33.3 g/dL (ref 30.0–36.0)
MCHC: 33.4 g/dL (ref 30.0–36.0)
MCHC: 33.5 g/dL (ref 30.0–36.0)
MCHC: 34 g/dL (ref 30.0–36.0)
MCV: 89.7 fL (ref 78.0–100.0)
MCV: 92 fL (ref 78.0–100.0)
MCV: 92.1 fL (ref 78.0–100.0)
Platelets: 53 10*3/uL — ABNORMAL LOW (ref 150–400)
Platelets: 63 10*3/uL — ABNORMAL LOW (ref 150–400)
Platelets: 81 10*3/uL — ABNORMAL LOW (ref 150–400)
Platelets: 94 10*3/uL — ABNORMAL LOW (ref 150–400)
Platelets: 95 10*3/uL — ABNORMAL LOW (ref 150–400)
RBC: 2.77 MIL/uL — ABNORMAL LOW (ref 3.87–5.11)
RBC: 2.83 MIL/uL — ABNORMAL LOW (ref 3.87–5.11)
RBC: 2.94 MIL/uL — ABNORMAL LOW (ref 3.87–5.11)
RBC: 3.08 MIL/uL — ABNORMAL LOW (ref 3.87–5.11)
RBC: 3.31 MIL/uL — ABNORMAL LOW (ref 3.87–5.11)
RBC: 3.51 MIL/uL — ABNORMAL LOW (ref 3.87–5.11)
RBC: 5.31 MIL/uL — ABNORMAL HIGH (ref 3.87–5.11)
RDW: 16.2 % — ABNORMAL HIGH (ref 11.5–15.5)
RDW: 16.2 % — ABNORMAL HIGH (ref 11.5–15.5)
RDW: 16.5 % — ABNORMAL HIGH (ref 11.5–15.5)
RDW: 17.1 % — ABNORMAL HIGH (ref 11.5–15.5)
RDW: 17.3 % — ABNORMAL HIGH (ref 11.5–15.5)
WBC: 10.7 10*3/uL — ABNORMAL HIGH (ref 4.0–10.5)
WBC: 11.4 10*3/uL — ABNORMAL HIGH (ref 4.0–10.5)
WBC: 15.9 10*3/uL — ABNORMAL HIGH (ref 4.0–10.5)
WBC: 5.7 10*3/uL (ref 4.0–10.5)
WBC: 6.9 10*3/uL (ref 4.0–10.5)
WBC: 7.7 10*3/uL (ref 4.0–10.5)
WBC: 9.1 10*3/uL (ref 4.0–10.5)

## 2010-11-30 LAB — RENAL FUNCTION PANEL
Albumin: 2.2 g/dL — ABNORMAL LOW (ref 3.5–5.2)
Albumin: 2.2 g/dL — ABNORMAL LOW (ref 3.5–5.2)
Albumin: 2.3 g/dL — ABNORMAL LOW (ref 3.5–5.2)
Albumin: 2.4 g/dL — ABNORMAL LOW (ref 3.5–5.2)
BUN: 16 mg/dL (ref 6–23)
CO2: 23 mEq/L (ref 19–32)
CO2: 25 mEq/L (ref 19–32)
CO2: 27 mEq/L (ref 19–32)
Calcium: 7.8 mg/dL — ABNORMAL LOW (ref 8.4–10.5)
Chloride: 103 mEq/L (ref 96–112)
Chloride: 105 mEq/L (ref 96–112)
Chloride: 99 mEq/L (ref 96–112)
Creatinine, Ser: 3.75 mg/dL — ABNORMAL HIGH (ref 0.4–1.2)
Creatinine, Ser: 4.02 mg/dL — ABNORMAL HIGH (ref 0.4–1.2)
Creatinine, Ser: 4.64 mg/dL — ABNORMAL HIGH (ref 0.4–1.2)
GFR calc Af Amer: 14 mL/min — ABNORMAL LOW (ref >60)
GFR calc Af Amer: 15 mL/min — ABNORMAL LOW (ref >60)
GFR calc Af Amer: 21 mL/min — ABNORMAL LOW (ref >60)
GFR calc Af Amer: 23 mL/min — ABNORMAL LOW (ref >60)
GFR calc non Af Amer: 12 mL/min — ABNORMAL LOW (ref >60)
GFR calc non Af Amer: 13 mL/min — ABNORMAL LOW (ref >60)
GFR calc non Af Amer: 17 mL/min — ABNORMAL LOW (ref >60)
Phosphorus: 2.1 mg/dL — ABNORMAL LOW (ref 2.3–4.6)
Potassium: 3.5 mEq/L (ref 3.5–5.1)
Potassium: 3.8 mEq/L (ref 3.5–5.1)
Potassium: 4.6 mEq/L (ref 3.5–5.1)
Sodium: 133 mEq/L — ABNORMAL LOW (ref 135–145)
Sodium: 136 mEq/L (ref 135–145)

## 2010-11-30 LAB — POCT I-STAT, CHEM 8
BUN: 16 mg/dL (ref 6–23)
BUN: 16 mg/dL (ref 6–23)
BUN: 17 mg/dL (ref 6–23)
BUN: 17 mg/dL (ref 6–23)
Calcium, Ion: 0.83 mmol/L — ABNORMAL LOW (ref 1.12–1.32)
Calcium, Ion: 0.84 mmol/L — ABNORMAL LOW (ref 1.12–1.32)
Calcium, Ion: 1.05 mmol/L — ABNORMAL LOW (ref 1.12–1.32)
Calcium, Ion: 1.09 mmol/L — ABNORMAL LOW (ref 1.12–1.32)
Chloride: 108 mEq/L (ref 96–112)
Chloride: 98 mEq/L (ref 96–112)
Creatinine, Ser: 5.5 mg/dL — ABNORMAL HIGH (ref 0.4–1.2)
Creatinine, Ser: 6.4 mg/dL — ABNORMAL HIGH (ref 0.4–1.2)
Creatinine, Ser: 6.5 mg/dL — ABNORMAL HIGH (ref 0.4–1.2)
Creatinine, Ser: 6.6 mg/dL — ABNORMAL HIGH (ref 0.4–1.2)
Glucose, Bld: 149 mg/dL — ABNORMAL HIGH (ref 70–99)
Glucose, Bld: 272 mg/dL — ABNORMAL HIGH (ref 70–99)
HCT: 33 % — ABNORMAL LOW (ref 36.0–46.0)
HCT: 36 % (ref 36.0–46.0)
HCT: 49 % — ABNORMAL HIGH (ref 36.0–46.0)
Hemoglobin: 10.5 g/dL — ABNORMAL LOW (ref 12.0–15.0)
Hemoglobin: 12.2 g/dL (ref 12.0–15.0)
Hemoglobin: 16.7 g/dL — ABNORMAL HIGH (ref 12.0–15.0)
Potassium: 3 mEq/L — ABNORMAL LOW (ref 3.5–5.1)
Potassium: 3.4 mEq/L — ABNORMAL LOW (ref 3.5–5.1)
Potassium: 4.1 mEq/L (ref 3.5–5.1)
Potassium: 4.6 mEq/L (ref 3.5–5.1)
Sodium: 139 mEq/L (ref 135–145)
Sodium: 141 mEq/L (ref 135–145)
Sodium: 141 mEq/L (ref 135–145)
Sodium: 144 mEq/L (ref 135–145)
TCO2: 17 mmol/L (ref 0–100)
TCO2: 20 mmol/L (ref 0–100)
TCO2: 22 mmol/L (ref 0–100)
TCO2: 24 mmol/L (ref 0–100)

## 2010-11-30 LAB — POCT I-STAT 3, ART BLOOD GAS (G3+)
Acid-Base Excess: 1 mmol/L (ref 0.0–2.0)
Acid-Base Excess: 5 mmol/L — ABNORMAL HIGH (ref 0.0–2.0)
Acid-base deficit: 3 mmol/L — ABNORMAL HIGH (ref 0.0–2.0)
Bicarbonate: 15.6 mEq/L — ABNORMAL LOW (ref 20.0–24.0)
Bicarbonate: 20.8 mEq/L (ref 20.0–24.0)
Bicarbonate: 22.1 mEq/L (ref 20.0–24.0)
Bicarbonate: 24.9 mEq/L — ABNORMAL HIGH (ref 20.0–24.0)
Bicarbonate: 26 mEq/L — ABNORMAL HIGH (ref 20.0–24.0)
O2 Saturation: 100 %
O2 Saturation: 100 %
O2 Saturation: 100 %
O2 Saturation: 75 %
O2 Saturation: 97 %
O2 Saturation: 98 %
Patient temperature: 36.8
Patient temperature: 37.3
TCO2: 19 mmol/L (ref 0–100)
TCO2: 23 mmol/L (ref 0–100)
TCO2: 23 mmol/L (ref 0–100)
TCO2: 27 mmol/L (ref 0–100)
pCO2 arterial: 28.7 mmHg — ABNORMAL LOW (ref 35.0–45.0)
pCO2 arterial: 37.1 mmHg (ref 35.0–45.0)
pCO2 arterial: 38.8 mmHg (ref 35.0–45.0)
pCO2 arterial: 40.4 mmHg (ref 35.0–45.0)
pCO2 arterial: 41.9 mmHg (ref 35.0–45.0)
pCO2 arterial: 42.5 mmHg (ref 35.0–45.0)
pCO2 arterial: 42.8 mmHg (ref 35.0–45.0)
pCO2 arterial: 43.6 mmHg (ref 35.0–45.0)
pCO2 arterial: 51.1 mmHg — ABNORMAL HIGH (ref 35.0–45.0)
pH, Arterial: 7.279 — ABNORMAL LOW (ref 7.350–7.400)
pH, Arterial: 7.291 — ABNORMAL LOW (ref 7.350–7.400)
pH, Arterial: 7.327 — ABNORMAL LOW (ref 7.350–7.400)
pH, Arterial: 7.344 — ABNORMAL LOW (ref 7.350–7.400)
pH, Arterial: 7.345 — ABNORMAL LOW (ref 7.350–7.400)
pH, Arterial: 7.391 (ref 7.350–7.400)
pO2, Arterial: 107 mmHg — ABNORMAL HIGH (ref 80.0–100.0)
pO2, Arterial: 117 mmHg — ABNORMAL HIGH (ref 80.0–100.0)
pO2, Arterial: 220 mmHg — ABNORMAL HIGH (ref 80.0–100.0)
pO2, Arterial: 233 mmHg — ABNORMAL HIGH (ref 80.0–100.0)
pO2, Arterial: 258 mmHg — ABNORMAL HIGH (ref 80.0–100.0)
pO2, Arterial: 59 mmHg — ABNORMAL LOW (ref 80.0–100.0)
pO2, Arterial: 82 mmHg (ref 80.0–100.0)

## 2010-11-30 LAB — COMPREHENSIVE METABOLIC PANEL
ALT: 16 U/L (ref 0–35)
ALT: 18 U/L (ref 0–35)
AST: 15 U/L (ref 0–37)
AST: 22 U/L (ref 0–37)
Albumin: 2.1 g/dL — ABNORMAL LOW (ref 3.5–5.2)
Albumin: 2.2 g/dL — ABNORMAL LOW (ref 3.5–5.2)
Albumin: 3.3 g/dL — ABNORMAL LOW (ref 3.5–5.2)
Alkaline Phosphatase: 168 U/L — ABNORMAL HIGH (ref 39–117)
Alkaline Phosphatase: 81 U/L (ref 39–117)
Alkaline Phosphatase: 82 U/L (ref 39–117)
BUN: 12 mg/dL (ref 6–23)
BUN: 13 mg/dL (ref 6–23)
BUN: 27 mg/dL — ABNORMAL HIGH (ref 6–23)
CO2: 26 mEq/L (ref 19–32)
CO2: 29 mEq/L (ref 19–32)
Calcium: 8.3 mg/dL — ABNORMAL LOW (ref 8.4–10.5)
Chloride: 100 mEq/L (ref 96–112)
Chloride: 104 mEq/L (ref 96–112)
Chloride: 94 mEq/L — ABNORMAL LOW (ref 96–112)
Creatinine, Ser: 3.58 mg/dL — ABNORMAL HIGH (ref 0.4–1.2)
GFR calc Af Amer: 13 mL/min — ABNORMAL LOW (ref >60)
GFR calc Af Amer: 6 mL/min — ABNORMAL LOW (ref >60)
GFR calc non Af Amer: 10 mL/min — ABNORMAL LOW (ref >60)
GFR calc non Af Amer: 13 mL/min — ABNORMAL LOW (ref >60)
GFR calc non Af Amer: 16 mL/min — ABNORMAL LOW (ref >60)
Glucose, Bld: 140 mg/dL — ABNORMAL HIGH (ref 70–99)
Potassium: 3.7 mEq/L (ref 3.5–5.1)
Potassium: 3.9 mEq/L (ref 3.5–5.1)
Potassium: 4.6 mEq/L (ref 3.5–5.1)
Sodium: 133 mEq/L — ABNORMAL LOW (ref 135–145)
Total Bilirubin: 0.9 mg/dL (ref 0.3–1.2)
Total Bilirubin: 1 mg/dL (ref 0.3–1.2)
Total Bilirubin: 1.3 mg/dL — ABNORMAL HIGH (ref 0.3–1.2)
Total Protein: 6.5 g/dL (ref 6.0–8.3)
Total Protein: 8 g/dL (ref 6.0–8.3)

## 2010-11-30 LAB — POCT I-STAT 4, (NA,K, GLUC, HGB,HCT)
Glucose, Bld: 251 mg/dL — ABNORMAL HIGH (ref 70–99)
Glucose, Bld: 309 mg/dL — ABNORMAL HIGH (ref 70–99)
HCT: 51 % — ABNORMAL HIGH (ref 36.0–46.0)
HCT: 52 % — ABNORMAL HIGH (ref 36.0–46.0)
Hemoglobin: 11.2 g/dL — ABNORMAL LOW (ref 12.0–15.0)
Hemoglobin: 17.3 g/dL — ABNORMAL HIGH (ref 12.0–15.0)
Hemoglobin: 17.7 g/dL — ABNORMAL HIGH (ref 12.0–15.0)
Potassium: 3.7 mEq/L (ref 3.5–5.1)
Potassium: 3.8 mEq/L (ref 3.5–5.1)
Sodium: 135 mEq/L (ref 135–145)
Sodium: 138 mEq/L (ref 135–145)

## 2010-11-30 LAB — BASIC METABOLIC PANEL
BUN: 20 mg/dL (ref 6–23)
CO2: 28 mEq/L (ref 19–32)
Calcium: 8.5 mg/dL (ref 8.4–10.5)
Chloride: 94 mEq/L — ABNORMAL LOW (ref 96–112)
Creatinine, Ser: 7.47 mg/dL — ABNORMAL HIGH (ref 0.4–1.2)
GFR calc Af Amer: 7 mL/min — ABNORMAL LOW (ref >60)
GFR calc Af Amer: 9 mL/min — ABNORMAL LOW (ref >60)
GFR calc non Af Amer: 6 mL/min — ABNORMAL LOW (ref >60)
GFR calc non Af Amer: 7 mL/min — ABNORMAL LOW (ref >60)
Glucose, Bld: 217 mg/dL — ABNORMAL HIGH (ref 70–99)
Potassium: 4.6 mEq/L (ref 3.5–5.1)
Potassium: 5 mEq/L (ref 3.5–5.1)
Sodium: 134 mEq/L — ABNORMAL LOW (ref 135–145)
Sodium: 142 mEq/L (ref 135–145)

## 2010-11-30 LAB — BLOOD GAS, ARTERIAL
FIO2: 0.21 %
Patient temperature: 98.6
TCO2: 28.2 mmol/L (ref 0–100)
pCO2 arterial: 40.4 mmHg (ref 35.0–45.0)
pH, Arterial: 7.439 — ABNORMAL HIGH (ref 7.350–7.400)

## 2010-11-30 LAB — DIFFERENTIAL
Basophils Absolute: 0 10*3/uL (ref 0.0–0.1)
Basophils Absolute: 0 10*3/uL (ref 0.0–0.1)
Basophils Relative: 0 % (ref 0–1)
Eosinophils Absolute: 0.3 10*3/uL (ref 0.0–0.7)
Eosinophils Relative: 4 % (ref 0–5)
Lymphocytes Relative: 16 % (ref 12–46)
Monocytes Absolute: 0.9 10*3/uL (ref 0.1–1.0)
Neutro Abs: 5.7 10*3/uL (ref 1.7–7.7)

## 2010-11-30 LAB — TYPE AND SCREEN
ABO/RH(D): B POS
Antibody Screen: NEGATIVE
Donor AG Type: NEGATIVE
Donor AG Type: NEGATIVE

## 2010-11-30 LAB — PREPARE PLATELETS

## 2010-11-30 LAB — APTT
aPTT: 29 seconds (ref 24–37)
aPTT: 36 seconds (ref 24–37)

## 2010-11-30 LAB — HEPARIN INDUCED THROMBOCYTOPENIA PNL
Heparin Induced Plt Ab: NEGATIVE
Patient O.D.: 0.155
UFH High Dose UFH H: 0 % Release
UFH Low Dose 0.1 IU/mL: 0 % Release

## 2010-11-30 LAB — CREATININE, SERUM
Creatinine, Ser: 5.89 mg/dL — ABNORMAL HIGH (ref 0.4–1.2)
GFR calc Af Amer: 15 mL/min — ABNORMAL LOW (ref >60)
GFR calc Af Amer: 9 mL/min — ABNORMAL LOW (ref >60)
GFR calc non Af Amer: 7 mL/min — ABNORMAL LOW (ref >60)

## 2010-11-30 LAB — MAGNESIUM
Magnesium: 2 mg/dL (ref 1.5–2.5)
Magnesium: 2.2 mg/dL (ref 1.5–2.5)
Magnesium: 2.3 mg/dL (ref 1.5–2.5)

## 2010-11-30 LAB — PLATELET COUNT: Platelets: 75 10*3/uL — ABNORMAL LOW (ref 150–400)

## 2010-11-30 LAB — HEMOGLOBIN AND HEMATOCRIT, BLOOD: HCT: 32.7 % — ABNORMAL LOW (ref 36.0–46.0)

## 2010-11-30 LAB — PROTIME-INR: INR: 1.86 — ABNORMAL HIGH (ref 0.00–1.49)

## 2010-11-30 LAB — CHOLESTEROL, TOTAL: Cholesterol: 133 mg/dL (ref 0–200)

## 2010-11-30 LAB — PHOSPHORUS
Phosphorus: 3 mg/dL (ref 2.3–4.6)
Phosphorus: 4.2 mg/dL (ref 2.3–4.6)
Phosphorus: 4.7 mg/dL — ABNORMAL HIGH (ref 2.3–4.6)

## 2010-11-30 LAB — POCT I-STAT GLUCOSE
Glucose, Bld: 120 mg/dL — ABNORMAL HIGH (ref 70–99)
Glucose, Bld: 185 mg/dL — ABNORMAL HIGH (ref 70–99)
Operator id: 3406

## 2010-11-30 LAB — CULTURE, RESPIRATORY W GRAM STAIN

## 2010-11-30 LAB — HEMOGLOBIN A1C: Mean Plasma Glucose: 169 mg/dL — ABNORMAL HIGH (ref <117)

## 2010-11-30 LAB — SURGICAL PCR SCREEN
MRSA, PCR: POSITIVE — AB
Staphylococcus aureus: POSITIVE — AB

## 2010-12-01 LAB — POTASSIUM: Potassium: 5.2 mEq/L — ABNORMAL HIGH (ref 3.5–5.1)

## 2010-12-01 NOTE — Op Note (Signed)
NAMEKATILYNN, SINKLER NO.:  [id number]  MEDICAL RECORD NO.:  [id number]           PATIENT TYPE:  O  LOCATION:  SDSC                         FACILITY:  [facility]  PHYSICIAN:  Fransisco Hertz, MD       DATE OF BIRTH:  05-08-1956  DATE OF PROCEDURE:  11/25/2010 DATE OF DISCHARGE:  11/25/2010                              OPERATIVE REPORT   PROCEDURE:  Right internal jugular vein cannulation under ultrasound guidance, placement of right internal jugular vein tunneled dialysis catheter.  PREOPERATIVE DIAGNOSIS:  End-stage renal disease, requiring hemodialysis, and left brachiocephalic arteriovenous fistula ulcer.  POSTOPERATIVE DIAGNOSIS:  End-stage renal disease, requiring hemodialysis, and left brachiocephalic arteriovenous fistula ulcer.  SURGEON:  Fransisco Hertz, MD.  ANESTHESIA:  Monitored anesthesia care and local.  FINDINGS:  Included tips of the catheter in the right atrium/superior vena cava junction.  SPECIMENS:  None.  ESTIMATED BLOOD LOSS:  Minimal.  INDICATIONS:  This is a 55 year old female that is in the process of having her left brachiocephalic arteriovenous fistula either ligated or aneurysmorrhaphy completed due to a big ulcer over her left brachiocephalic arteriovenous fistula.  This has already been scheduled for this coming Tuesday, however, prior to that procedure, her nephrologist wanted to have a tunneled dialysis catheter placed to continue dialysis as they felt that dialysis via the left ulcerated fistula may be dangerous.  She is aware of the risks of this procedure include bleeding, infection, possible central venous injury, and possible pneumothorax.  DESCRIPTION OF PROCEDURE:  After full informed written consent was obtained from the patient, she was brought back to the operating room, placed supine upon the operating table.  Prior to induction, she had received IV antibiotics.  She was then prepped and draped in the  standard fashion for a neck or chest tunneled dialysis catheter after obtaining adequate sedation.  I turned my attention to interrogating her right neck with an ultrasound.  There was a patent right internal jugular vein.  The skin in the neck was anesthetized with a 1:1 mix of 0.5% Marcaine without epinephrine and 1% lidocaine with epinephrine for a total of 20 mL throughout this case.  I then under ultrasound guidance cannulated this right internal jugular vein under ultrasound guidance and placed the wire down into the right ventricle.  The wire was clamped to the drapes and then I anesthetized the chest and the subcutaneous tunnel for this tunneled dialysis catheter.  Using a #11 blade, I then made stab incisions at the exit site and at the neck cannulation site. I then dissected using a metal tunneler from the chest up to the neck and dilated up the subcutaneous tunnel with a dilator over the metal tunneler.  At this point, I unclamped the wire, removed the needle, and then serially dilated up to skin tract and venotomy under fluoroscopic guidance to make certain we caused no injury to central venous structures.  Eventually, I loaded the dilator sheath over the wire under fluoroscopic guidance and placed it down into the superior vena cava. The wire and the dilator were removed.  A 23-cm Diatek catheter was  then placed through the sheath.  The sheath was broken and peeled away while holding the catheter in place.  The back end of this catheter was connected to the metal tunneler and passed in a retrograde fashion through the subcutaneous tunnel.  Then, I clamped the catheter, and transected the back end of this catheter revealing the two lumens of the catheter.  The two ports were then docked onto these two lumens and the catheter hub was screwed into place.  I then pulled back the catheter to appropriate location with the cuff about a centimeter away from the exit.  I then looked  under fluoroscopic guidance to see where the tips of the catheter were located.  They appeared to be in the right atrium/superior vena caval  junction.  I felt that this was a good location for this catheter.  I checked  the rest of this catheter route and there was no obvious kinking.  Then, I  tested each of the ports by aspirating and flushing.  There was no resistance  noted in either port.  I then loaded each port with heparinized saline and then secured the catheter in place with two interrupted stitches of nylon tied to the catheter.  The neck incision was then closed with a U- stitch of 4-0 Monocryl.  The skin was cleaned and dried.  Sterile bandages were applied.  Then, each port was loaded with concentrated heparin at 1000 unit/mL at the manufacturer recommended volumes to each port and then each port was sterilely capped.  COMPLICATIONS:  None.  CONDITION:  Stable.     Fransisco Hertz, MD     BLC/MEDQ  D:  11/25/2010  T:  11/26/2010  Job:  [id number]  Electronically Signed by Leonides Sake MD on 12/01/2010 10:11:07 AM

## 2010-12-07 ENCOUNTER — Other Ambulatory Visit: Payer: Self-pay | Admitting: Internal Medicine

## 2010-12-07 IMAGING — CR DG CHEST 2V
2 series · 2 of 2 positions shown · non-contrast
Comparison: 04/07/2010 and 03/28/2010.

CLINICAL DATA: Postop CABG.  No complaints.

CHEST - 2 VIEW

[view not recorded (1 of 2)]
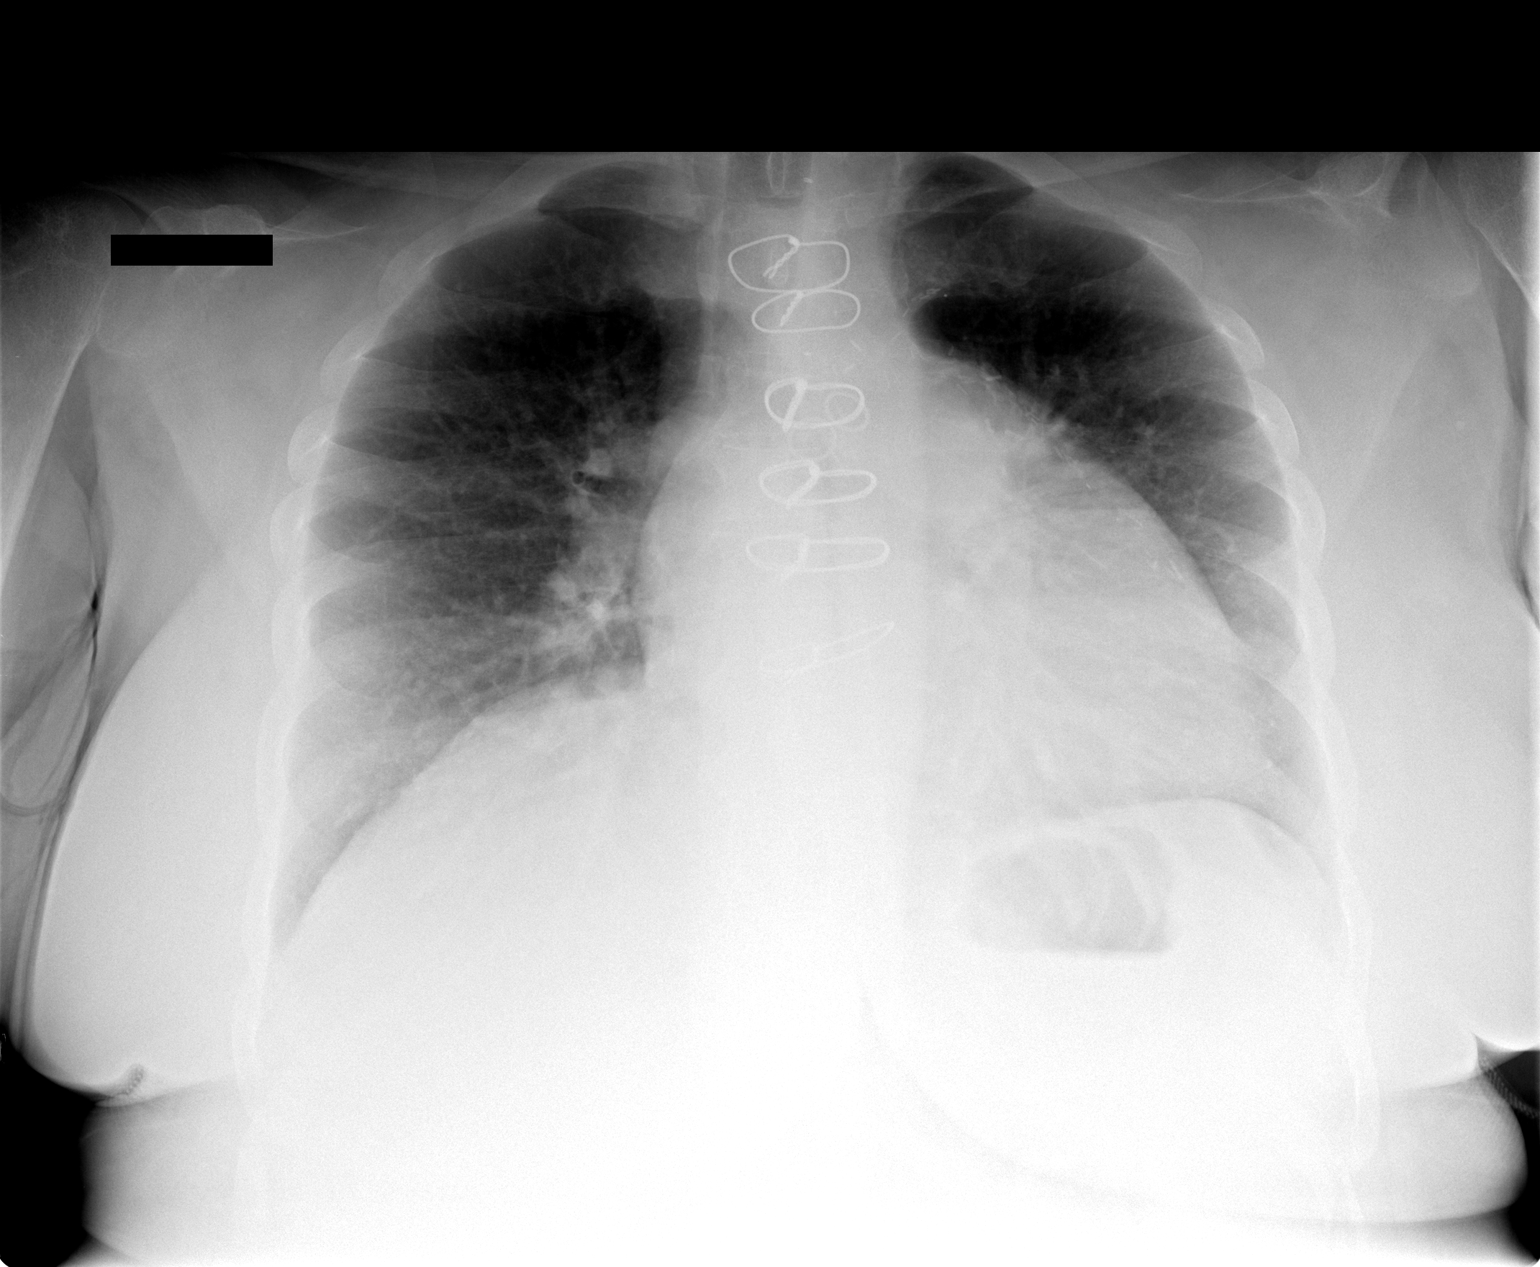

[view not recorded (2 of 2)]
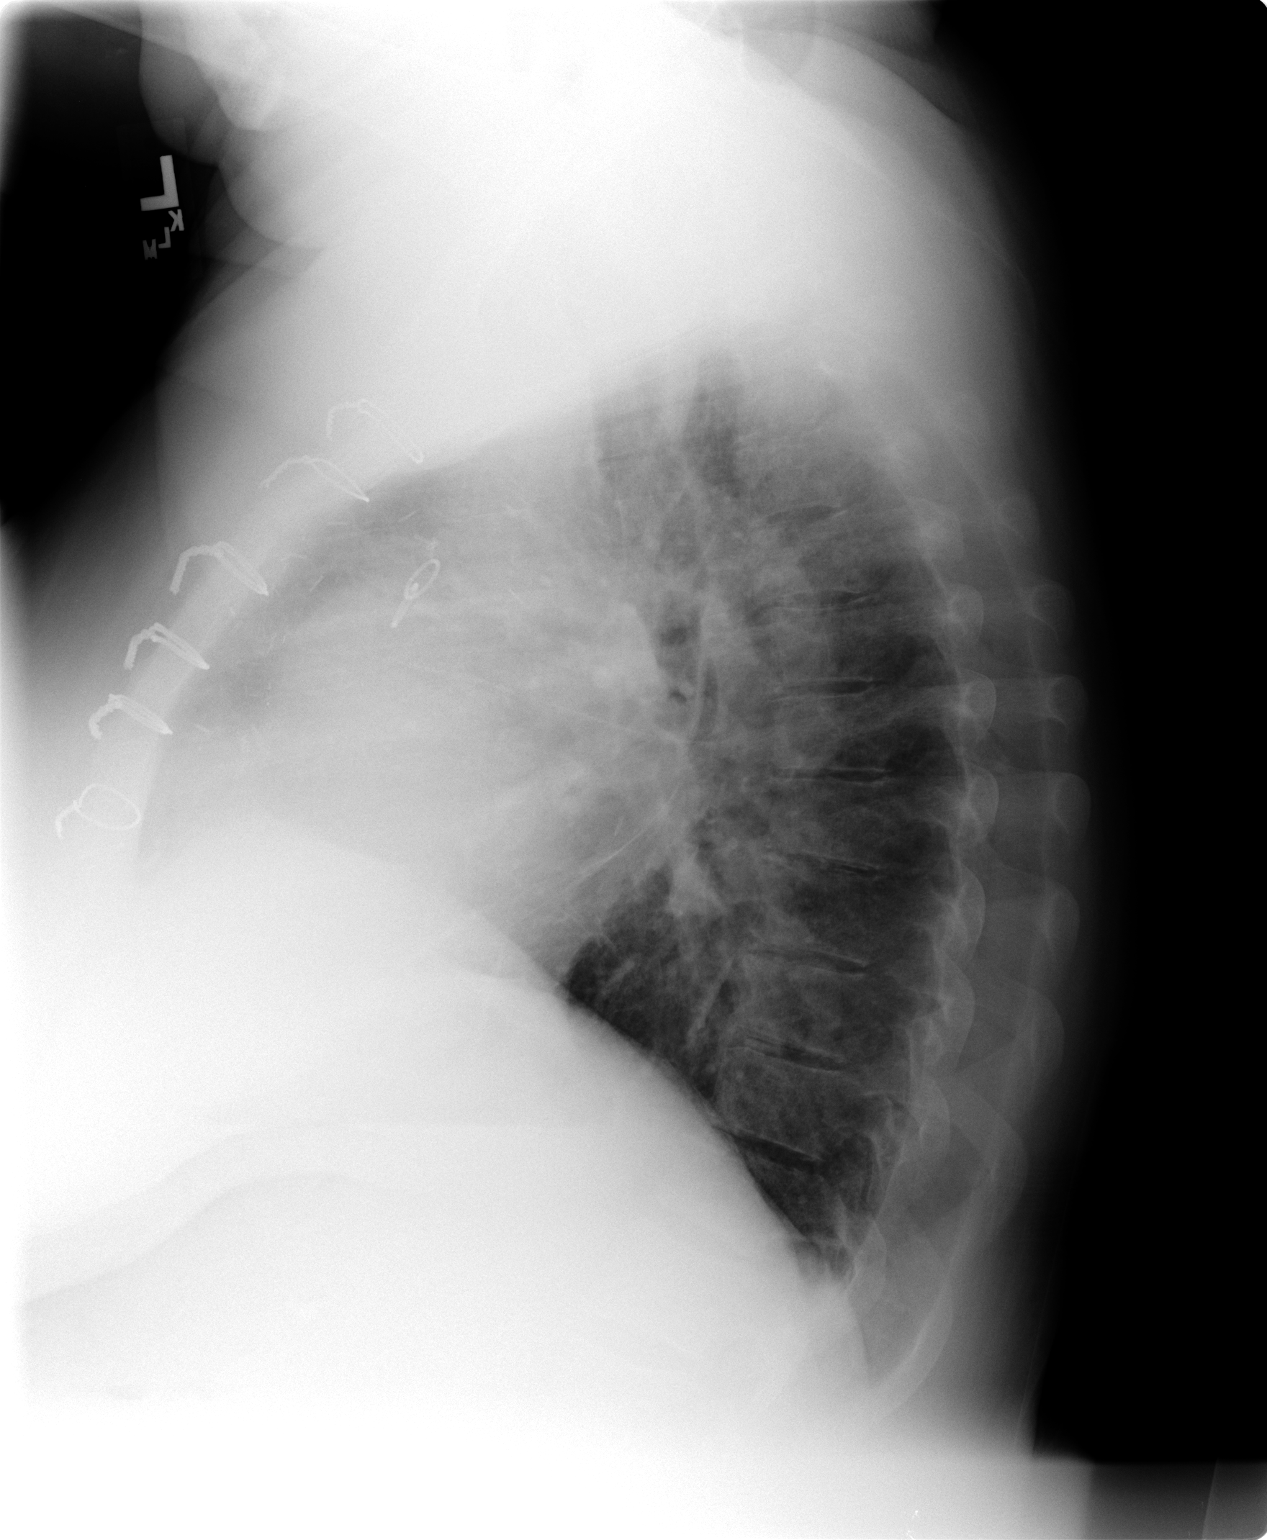

[2 of 2 positions shown; findings below may reference images not displayed]

FINDINGS: There is stable cardiac enlargement status post CABG.
Left pleural effusion has resolved.  The left basilar aeration is
improved.  There is no edema or pneumothorax.
IMPRESSION: No active cardiopulmonary process status post CABG.  Resolved left
pleural effusion.

## 2011-01-06 ENCOUNTER — Encounter (INDEPENDENT_AMBULATORY_CARE_PROVIDER_SITE_OTHER): Payer: Medicare Other

## 2011-01-06 ENCOUNTER — Ambulatory Visit (INDEPENDENT_AMBULATORY_CARE_PROVIDER_SITE_OTHER): Payer: Medicare Other | Admitting: Vascular Surgery

## 2011-01-06 DIAGNOSIS — I739 Peripheral vascular disease, unspecified: Secondary | ICD-10-CM

## 2011-01-06 DIAGNOSIS — I7092 Chronic total occlusion of artery of the extremities: Secondary | ICD-10-CM

## 2011-01-07 NOTE — Assessment & Plan Note (Signed)
OFFICE VISIT  [name] DOB:  [birth year]                                       01/06/2011 [id number]#:[id number]  I saw patient in the office today in consultation concerning her paresthesias of her left foot.  This is a pleasant 55-year woman who has undergone a previous right below-the-knee amputation.  She recently had a right radiocephalic fistula placed.  She states that for over 6 months, she has had paresthesias in her left foot which has been attributed to neuropathy.  Given the problems she had with her right leg, which ultimately resulted in limb loss, she was quite concerned about the numbness in her left leg and wanted to have a vascular evaluation.  Of note, she is ambulatory with her prosthesis, I did not get any history of claudication on the left leg.  She has had no history of rest pain and no history of nonhealing wounds on the left.  Her paresthesias in the left foot have been stable, and there are really no aggravating or alleviating factors except that she states that it sometimes is more significant when her foot is hanging down.  PAST MEDICAL HISTORY:  Significant for insulin-dependent diabetes and coronary artery disease.  She underwent coronary revascularization in June 2011.  SOCIAL HISTORY:  She is single.  She does not have any children.  She does not use tobacco.  REVIEW OF SYSTEMS:  CARDIOVASCULAR:  She had no chest pain, chest pressure, palpitations or arrhythmias.  She does admit to dyspnea on exertion.  She has had no history of stroke or TIA. PULMONARY:  She has had no productive cough, bronchitis, asthma, or wheezing.  PHYSICAL EXAMINATION:  This is a pleasant 55 year old woman who appears her stated age.  Blood pressure is 123/79, heart rate is 103.  Lungs are clear bilaterally to auscultation without rales, rhonchi, or wheezing. Cardiovascular:  I do not detect any carotid bruits.  She has a regular rate and  rhythm.  She has palpable femoral pulses.  The right below-the- knee amputation stump is warm and well-perfused.  I cannot palpate pedal pulses on the left, although the left foot is warm and well-perfused without ischemic ulcers.  She has no significant lower extremity swelling.  Abdomen:  Soft, nontender.  Neurologic:  She has no focal weakness or paresthesias.  I did independently interpret her arterial Doppler study today which shows a biphasic dorsalis pedis signal on the left with no posterior tibial signal.  ABI on the left is 56%.  Although her circulation is not normal in the left leg, I think she has a very reasonable circulation with a biphasic dorsalis pedis signal.  I would not recommend any aggressive further vascular workup unless she developed a wound or a significant drop in her ABIs.  I think we do need to continue to follow her left leg closely, given the fact that she has had an amputation on the right.  I have ordered a follow-up arterial Doppler study in 6 months, and I will see her back at that time.  She knows to call sooner if she has problems.    [name]. [name], M.D. Electronically Signed  CSD/MEDQ  D:  01/06/2011  T:  01/07/2011  Job:  [id number]  cc:   [facility] Salvia. Eliott Nine, M.D.

## 2011-01-22 ENCOUNTER — Encounter: Payer: Self-pay | Admitting: Internal Medicine

## 2011-01-26 NOTE — Assessment & Plan Note (Signed)
OFFICE VISIT   [name], [name]  DOB:  1956-02-03                                       06/20/2007  [id number]#:[id number]   I saw the patient in the office today for continued followup of her  peripheral vascular disease.  I had last seen her in April of this year  with evidence of superficial femoral artery and tibial occlusive disease  bilaterally.  Her symptoms at that time were quite tolerable, and we did  not recommend proceeding with arteriography unless her symptoms  progressed.   Since I saw her last 6 months ago her symptoms have remained relatively  stable.  She experiences claudication in both calves associated with  ambulation and relieved with rest.  She also had some rest pain in both  feet, which has been stable.  She has had no history of non-healing  ulcers.  There has been no significant change in her medical history.   REVIEW OF SYSTEMS:  She has had no recent chest pain, chest pressure,  palpitations, or arrhythmias.  She has had no bronchitis, asthma, or wheezing.   PHYSICAL EXAMINATION:  Blood pressure is 132/86, heart rate is 100.  HEENT, there is no cervical lymphadenopathy.  I do not detect any  carotid bruits.  Lungs are clear bilaterally to auscultation.  On  cardiac exam, she has a regular rate and rhythm.  She has palpable  femoral pulses.  I cannot palpate popliteal or pedal pulses on either  side.  She has a monophasic dorsalis pedis signal on the right, and a  monophasic dorsalis pedis and peroneal signal on the left.  I cannot get  a posterior tibial signal on either side.  No ischemic ulcers on her  feet.   I again explained that, as long as her symptoms are tolerable, we do not  necessarily need to proceed with arteriography in consideration for  revascularization.  Likely, she would require a fem-tibial bypass graft.  If her symptoms progress, then the next logical step would be  arteriography.  Currently, however, she  has no open ulcers and, at this  point, she feels comfortable waiting and following her symptoms for now.  I plan on seeing her back in 6 months.  She knows to call sooner if she  has problems.   [name]. Edilia Bo, M.D.  Electronically Signed   CSD/MEDQ  D:  06/20/2007  T:  06/21/2007  Job:  [id number]

## 2011-01-26 NOTE — Assessment & Plan Note (Signed)
OFFICE VISIT   [name], [name]  DOB:  April 07, 1956                                       05/07/2008  [id number]#:[id number]   I saw the patient in the office today for continued followup of her  right below-the-knee amputation wound.  We have been treating this with  a vac and at her most recent visit it measured 8 cm in length x 3.5 cm  in width and was granulating nicely.  She comes in for 3-week followup  visit.   EXAMINATION:  The wound continues to granulate nicely and appears  adequately perfused.  However, it has not changed significantly in size.  Again measures 8 cm in maximum length and 3.5 cm in maximum width.  As  we do not seem to be making progress with a vac, I am going to go to  hydrogel dressing changes daily by home health nurse and see her back in  3 weeks.  Hopefully, this will continue to contract.  Otherwise, would  have to consider a skin graft, although she is reluctant to consider a  skin graft at this point.   [name]. Edilia Bo, M.D.  Electronically Signed   CSD/MEDQ  D:  05/07/2008  T:  05/08/2008  Job:  [id number]

## 2011-01-26 NOTE — H&P (Signed)
NAMEROYETTA, PROBUS NO.:  [id number]   MEDICAL RECORD NO.:  [id number]          PATIENT TYPE:  IPS   LOCATION:  4155                         FACILITY:  [facility]   PHYSICIAN:  Ranelle Oyster, M.D.DATE OF BIRTH:  07-05-1956   DATE OF ADMISSION:  10/03/2007  DATE OF DISCHARGE:                              HISTORY & PHYSICAL   CHIEF COMPLAINTS:  Right leg pain.   HISTORY OF PRESENT ILLNESS:  This is a pleasant 55 year old black female  with a history of failed renal transplant and diabetes.  The patient was  dialysis for end-stage renal disease now and has history of nonischemic  cardiomyopathy.  She was admitted on January 6 with infection of the  right great toe.  Ultimately she required a right below-knee amputation  due to infection and gangrenous changes.  The patient remains on  antibiotics for wound treatment.  Follow-up wound cultures have been  negative.  There is no clear duration on antibiotic regimen at this  point.   REVIEW OF SYSTEMS:  Notable for some right leg pain.  She is moving her  bowels. She has had some occasional insomnia, but no real issues there.  She is on Vicodin for pain.  Appetite has been fair to poor.   PAST MEDICAL HISTORY:  1. Positive for failed renal transplant in 2007.  2. End-stage renal disease on hemodialysis.  3. Insulin-requiring diabetes.  4. Nonischemic cardiomyopathy.  5. Hypertension.  6. Hyperlipidemia.  7. Chronic anemia.   FAMILY HISTORY:  Positive for CAD and diabetes.   SOCIAL HISTORY:  The patient lives alone and plans to stay with her  brother who works.  She is on disability prior to arrival.  She plans to  move to his house where there is one level and two to three steps to  enter.   FUNCTIONAL HISTORY:  The patient was independent prior to arrival.  Currently she is requiring mod assist for basic mobility and transfers.   ALLERGIES:  VYTORIN.   MEDICATIONS AT HOME:  Rena-Vite, Renagel,  prednisone, Sensipar, 70/30  insulin 25 a.m., 12 units p.m.; Lipitor, hydroxyzine.   LABORATORY DATA:  Hemoglobin was 11.5, white count 13,000, platelets  362,000.  Sodium 132, potassium 4.3, BUN and creatinine 50 and 9.39.   PHYSICAL EXAMINATION:  VITAL SIGNS:  Blood pressure 92/65, pulse 89,  respiratory rate 18.  GENERAL:  Patient is pleasant, sitting in bed in no acute stress.  HEENT:  Pupils equal, round, reactive to light.  Oral mucosa is pink and  moist.  Dentition is fair.  NECK:  Supple without JVD or lymphadenopathy.  CHEST:  Clear to auscultation bilaterally without wheezes, rales or  rhonchi.  HEART:  Regular rate and rhythm without murmur, rubs or gallops.  ABDOMEN:  Soft, nontender.  Bowel sounds are positive.  SKIN:  Clean and intact.  EXTREMITIES:  Right below-knee wound was clean  a3d well approximated with no signs of drainage.  Area was appropriately  tender.  Edema was 1+ at the wound.  NEUROLOGICAL:  Cranial nerves II-XII are intact.  Pressure was decreased  somewhat distally in the lower limbs and lesser so in upper extremities.  Judgment, orientation, memory, and mood were all within normal limits.  Strength in the upper 5/5.  Right lower was 3/5.  Left lower 3/5  proximal to 4/5 distal.   ASSESSMENT/PLAN:  1. Functional deficits secondary right below-knee amputation for      gangrenous changes and of peripheral vascular disease.  Begin      comprehensive inpatient rehab with physical therapy to assess and      treat for range of motion, transfers, pregait training and      equipment.  She will be wheelchair old wheelchair level upon      discharge.  Occupational therapy will assess and treat range of      motion, ADLs, equipment, and safety.  Rehab nurse will follow on 24-      hour basis for bowel, bladder, skin medication, safety, and skin      issues.  Rehab case manager/social worker will assess for      psychosocial needs for discharge planning.   Estimated length of      stay:  7-10 days.  Goals:  Modified independent at the wheelchair      level.  Prognosis good.  2. End-stage renal disease:  Continue dialysis Monday, Wednesday and      Friday.  3. Diabetes:  Check CBCs a.c. and h.s. with her insulin coverage.      Continue insulin 70/30 q.a.m. and p.m.  4. Infectious disease:  Maintain the patient on IV Zosyn and      vancomycin for now.  Need to discuss with surgery regarding length      of treatments.  5. Hypertension:  Continue to monitor the patient clinically.  6. Anticoagulation:  Continue Plavix 75 mg daily and subcu Lovenox 30      mg daily for DVT prophylaxis.  7. Sleep and anxiety:  Klonopin h.s.      Ranelle Oyster, M.D.  Electronically Signed     ZTS/MEDQ  D:  10/03/2007  T:  10/04/2007  Job:  [id number]

## 2011-01-26 NOTE — Assessment & Plan Note (Signed)
OFFICE VISIT   MEYA, CLUTTER  DOB:  Jun 12, 1956                                       12/05/2007  [id number]#:[id number]   HISTORY:  I saw the patient for continued followup after her recent  right below the knee amputation which was performed on 10/03/2007.  She  developed some eschar over the lateral aspect of her incision and comes  in for a routine followup visit.  She has been doing the dressing change  herself essentially with dry gauze.   PHYSICAL EXAMINATION:  On examination there is significant eschar which  I did an excisional debridement on in the office today.   I think it would be best to switch to hydrogel with moist 4x4s to  promote healing and she feels confident that she can do this herself.  I  have asked her to call if she cannot and we can certainly arrange for  home health to do this.  I plan on seeing her back in 2 weeks for  continued followup.  The amputation site looks well perfused and I think  we can get this to heal.  At that point we can then get her fitted for a  prosthesis.   [name]. Edilia Bo, M.D.  Electronically Signed   CSD/MEDQ  D:  12/05/2007  T:  12/06/2007  Job:  [id number]

## 2011-01-26 NOTE — Assessment & Plan Note (Signed)
OFFICE VISIT   [name], [name]  DOB:  1956/03/20                                       02/13/2008  [id number]#:[id number]   I saw this patient in the office today for continued followup of the  wound on her right BKA.  She had undergone a right below the knee  amputation in January of this year and had some dehiscence to the  lateral aspect of her wound edges and has been undergoing aggressive  wound care.  Most recently had switched to the vac.  She comes in for  routine wound check.  Her main complaint is that the wound has had some  odor to it.  She had no fever or chills.   PHYSICAL EXAMINATION:  Blood pressure is 110/78, heart rate is 107.  The  wound has some devitalized tissue.  I did some excisional debridement in  the office today and this may explain partly the odor.  There appears to  be reasonable granulation.  The wound measures about the same to me as  it did prior to this, although the home health nurse has measured it and  feels it has gotten smaller.  Will go to a wet-to-dry dressing until she  has the vac put back on tomorrow.  I plan on seeing her back in 2 weeks.  I have also written her a prescription for Keflex for 2 weeks and  adjusted her dose for her renal insufficiency.  I have also written a  prescription for 30 Vicodin for pain and she is having with the wound.  If she continues to have problems with odor we may have to switch back  to wet-to-dry dressings.  However, I think we still have a fighting  chance of saving the BKA.  Obviously if this did not heal she would  require a conversion to an above-the-knee amputation.   [name]. Edilia Bo, M.D.  Electronically Signed   CSD/MEDQ  D:  02/13/2008  T:  02/14/2008  Job:  [id number]

## 2011-01-26 NOTE — Procedures (Signed)
CEPHALIC VEIN MAPPING   INDICATION:  End-stage renal disease.   HISTORY:  End-stage renal disease.   EXAM:   The right cephalic vein is compressible.   Diameter measurements range from 0.36 to 0.53.   The left cephalic vein is not evaluated.   Diameter measurements range from   See attached worksheet for all measurements.   IMPRESSION:  Patent right cephalic vein which is of acceptable diameter  for use as a dialysis access site.   ___________________________________________  [name]. Edilia Bo, M.D.   MG/MEDQ  D:  08/28/2008  T:  08/28/2008  Job:  [id number]

## 2011-01-26 NOTE — Op Note (Signed)
[name], [name]               ACCOUNT NO.:  [id number]   MEDICAL RECORD NO.:  [id number]          PATIENT TYPE:  AMB   LOCATION:  ENDO                         FACILITY:  [facility]   PHYSICIAN:  James L. Malon Kindle., M.D.DATE OF BIRTH:  Jan 14, 1956   DATE OF PROCEDURE:  01/23/2008  DATE OF DISCHARGE:  01/23/2008                               OPERATIVE REPORT   PROCEDURE:  Colonoscopy and polypectomy.   MEDICATIONS:  Fentanyl 50 mcg, Versed 4 mg IV.   INDICATION:  Colonoscopy performed to increase risk of colon polyps, due  to a strong family history of colon polyps.   DESCRIPTION OF PROCEDURE:  The procedure had been explained to the  patient, consent obtained.  In the left lateral decubitus position the  Pentax pediatric scope was inserted.  The original dictation apparently  was not picked up and this is done several days later from memory.  The  note indicates the cecum was reached.  No abnormalities were seen in the  cecum, ascending, transverse, descending colon other than mild  diverticulosis in the descending and sigmoid colon.  In the mid sigmoid  a 0.5 cm polyp was removed with snare polypectomy and recovered.  The  scope was withdrawn in the rectum.  No other polyps were seen.  The  patient went to the recovery room in good condition.   ASSESSMENT:  1. Sigmoid colon polyp removed.  2. Diverticulosis.  3. Strong family history of colon polyps.   PLAN:  Routine post polypectomy instructions.  Will avoid nonsteroidal  drugs for 5 days and recommend repeating colonoscopy in 5 years.           ______________________________  Llana Aliment Malon Kindle., M.D.     Waldron Session  D:  01/26/2008  T:  01/26/2008  Job:  [id number]   cc:   Aram Beecham B. Eliott Nine, M.D.  Fax: [phone]   Llana Aliment. Malon Kindle., M.D.  Fax: [phone]

## 2011-01-26 NOTE — Assessment & Plan Note (Signed)
OFFICE VISIT   NIKA, YAZZIE  DOB:  [birth year]                                       07/30/2008  QIHKV#:[id number]   I saw the patient in the office today concerning the wound on her left  leg.  She had a previous right below-the-knee amputation.  I have been  following the wound on the lateral aspect of the amputation site which  has been nonhealing.  Approximately 2 months ago she bumped her  pretibial area of the left leg and this has been very slow to heal.  She  wanted to have this evaluated.  She has had no fever or chills.  She has  had no drainage from the area and the wound has been dry.   PHYSICAL EXAMINATION:  This is a pleasant 55 year old woman who appears  her stated age.  I did inspect her wound on the right below-the-knee  amputation site and this measures 5 cm in length x 3 cm in width and is  granulating nicely.  The wound on her left leg measures 1 cm in diameter  and is dry.  There is no drainage or erythema.  This is in the pretibial  area in the mid leg on the left.  She has a palpable femoral pulse.  I  cannot palpate popliteal pulse.  However, she has a fairly brisk  peroneal and anterior tibial signal with the Doppler in addition to the  dorsalis pedis signal which is fairly brisk.  I cannot obtain a  posterior tibial signal.   I think she does have adequate circulation to heal this wound.  I have  instructed her to keep the skin well lubricated with bag balm, Eucerin,  or cocoa butter.  I will see her back in 6 weeks to check the right BKA  and the left leg wound.  If the wound on the left fails to heal, she  would need an arteriogram.  She has not had an arteriogram on the left  side, only the right side.   [name]. Edilia Bo, M.D.  Electronically Signed   CSD/MEDQ  D:  07/30/2008  T:  07/31/2008  Job:  [id number]

## 2011-01-26 NOTE — Assessment & Plan Note (Signed)
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  She had some dehiscence in the lateral aspect of the BKA and  we have been treating this aggressively with dressing changes, most  recently we have been using the vac on this.  I last saw her on  03/05/2008.  She comes in for a 3-week followup visit.  She has no  specific complaints.  Prior she had been having some problems with they  removed the sponge, as this was sticking and causing pain.  We  recommended Mepitel, which they have been using.   EXAMINATION:  The wound measures 9 cm in length x 4 cm in width.  Previously it measured 10 cm in length x 4 cm in width.  Thus, it has  improved somewhat with the vac.  The wound is granulating nicely.  I did  do some mild excisional debridement in the office today.  I would be  reluctant at this point to use a skin graft as I think it would be more  durable and better hold up to a prosthesis if we can allow this to  contract in, even though it is obviously taking some time.  We will  continue with the vac for now as we are making progress.  I will see her  back in 4 weeks.  She knows to call sooner if she has problems.   [name]. Edilia Bo, M.D.  Electronically Signed   CSD/MEDQ  D:  03/26/2008  T:  03/27/2008  Job:  [id number]

## 2011-01-26 NOTE — Assessment & Plan Note (Signed)
OFFICE VISIT   [name], [name]  DOB:  03/13/1956                                       08/22/2007  ZOXWR#:[id number]   HISTORY:  I saw the patient in the office today for continued followup  of her left upper arm fistula.  She was seen a month ago by Dr. 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  At that time her fistula was functioning adequately and there were  really no good options to preserve the fistula and address the aneurysm.  She was sent back for followup visit to discuss this further.   Since she was seen last they continue to use her fistula for dialysis  without any significant problems.  The aneurysm has remained stable in  size according to the patient.   PHYSICAL EXAMINATION:  Vital signs:  On physical examination blood  pressure is 120/78, heart rate is 100.  Extremities:  The fistula is  patent with an excellent bruit and thrill.  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  The  more distal aneurysm is the larger of the 2.   I agree with Dr. Estanislado Spire assessment that there is really no good way  to repair the aneurysm without converting this to an AV graft.  I have  explained that the options are to simply to continue to use this for now  until the aneurysm becomes more problematic.  The other option would be  to place a graft or fistula in the right arm and continue to use the  left arm fistula until the new access is ready and then address the  aneurysm in the left arm.  The third option would be to place a graft in  the left arm and a catheter for dialysis until the new graft could be  used.   Again, she still is happy with using her fistula for dialysis and is not  anxious to have any further surgery at this time.  We will be happy to  see her back if she develops any symptoms from her aneurysm or she has  problems with dialysis.   [name]. Edilia Bo, M.D.  Electronically Signed   CSD/MEDQ  D:  08/22/2007   T:  08/23/2007  Job:  [id number]   cc:   Fayrene Fearing L. Deterding, M.D.

## 2011-01-26 NOTE — Assessment & Plan Note (Signed)
OFFICE VISIT   [name], [name]  DOB:  12-31-1955                                       03/25/2009  ZOXWR#:[id number]   I saw [name] today in the office today for followup.  She had a  previous right below-the-knee amputation and had a large wound which  took some time to heal.  She had placement of a VAC and ultimately this  healed and epithelialized nicely.  When I saw her last 3 months ago, she  was given a prescription for a prosthesis.  She comes in now with her  prosthesis and is ambulating.  She has had no pain in the left leg and  no rest pain or nonhealing ulcers on the left.   REVIEW OF SYSTEMS:  She has had no chest pain or chest pressure, or  significant shortness of breath.   SOCIAL HISTORY:  She is not a smoker.   PHYSICAL EXAMINATION:  This is a pleasant, 55 year old woman who appears  her stated age.  Her blood pressure is 106/69, heart rate is 92.  Lungs  are clear bilaterally to auscultation.  She has a well-healed below-the-  knee amputation site of the right with her prosthesis in place.  On the  left side, I cannot palpate pedal pulses although the foot is warm and  well perfused.  She has no ischemic ulcers.  She has no significant  lower extremity swelling.   Overall, I am pleased with her progress and I will see her back in 1  year.  She knows to call sooner if she has problems.  In the meantime,  she knows to stay as active as possible and I have encouraged her to  walk daily.   [name]. Edilia Bo, M.D.  Electronically Signed   CSD/MEDQ  D:  03/25/2009  T:  03/26/2009  Job:  [id number]

## 2011-01-26 NOTE — Assessment & Plan Note (Signed)
OFFICE VISIT   [name], [name]  DOB:  1956-03-14                                       12/19/2007  ZOXWR#:[id number]   Patient underwent a right below-knee amputation on 09/27/07 for a  nonhealing right foot wound.  When I saw her last, she had developed  some eschar over the lateral aspect of her wounds and underwent a  excisional  debridement in the office last week.  She comes in for a  routine follow-up visit.  She has been doing a dressing change herself  twice a day with Hydrogel.   On examination, there was more devitalized tissue, which I debrided in  the office today.  There was some granulation tissue.  The wound looks  marginally perfused.  Hopefully I can get this clean enough that we can  potentially place a vac on it.  Certainly, with her peripheral vascular  disease, diabetes, and chronic renal insufficiency, she is at risk for  not healing this and ultimately requiring an above-the-knee amputation.  I will continue to aggressively treat this in hopes of getting it to  heal.  I have given her a prescription for 30 oxycodone for pain, as she  does have significant pain associated with this.  It is usually after  dialysis.   [name]. Edilia Bo, M.D.  Electronically Signed   CSD/MEDQ  D:  12/19/2007  T:  12/20/2007  Job:  [id number]

## 2011-01-26 NOTE — Assessment & Plan Note (Signed)
[name] is back regarding her right below-knee amputation.  She was last  seen by Dr. Thomasena Edis in late January in the hospital.  She presents back  for prosthetic evaluation today.  She has been active in her chair.  She  does not use her walker much due to some tremors from the prednisone but  has been keeping up with strengthening exercises and range of motion.  Her concern is the right leg which still has some drainage and is open.  She is supposed to see Dr. Edilia Bo today.  She rates her pain as a 3-  4/10.  She has some phantom and some stump pain.  Her pain interferes  with general activity, relations with others, and enjoyment of life on a  mild-to-moderate level.   SOCIAL HISTORY:  Patient is living with her brother currently but will  be moving into a handicapped accessible apartment at the end of April.   REVIEW OF SYSTEMS:  Notable for tremor.  Other pertinent positives are  above, and full review is in the written health and history section.   MEDICATIONS:  Renagel, Renavite, Sensipar, PhosLo, insulin 70/30,  clonazepam, Plavix, prednisone, and oxycodone p.r.n.   FAMILY HISTORY:  Notable for heart disease, diabetes, high blood  pressure, disability.   PHYSICAL EXAMINATION:  Blood pressure is 131/69, pulse 119, respiratory  rate 18.  She is satting at 95% on room air.  Patient is pleasant, alert and oriented x3.  Affect is bright and  appropriate.  Right knee is notable for an 8 x 1 cm area of open wound  which appeared to be dehiscing.  She has mild-to-moderate  serosanguineous drainage with a thicker component to it as well.  The  leg was moderately sensitive.  She had good range of motion at the knee  with 100% of extension.  She had strength at 5/5 of the knee and hip on  the right side.  The left lower extremity was intact as well.  She was  able to stand from her chair to the walker with no assistance and really  no exertion today.  Cognitively, she was intact.  HEART:  Tachycardic.  CHEST:  Clear.  ABDOMEN:  Soft, nontender.   ASSESSMENT:  1. Right below-knee amputation.  2. Ongoing wound healing issues.  3. End-stage renal disease, on hemodialysis.  4. Hypertension.   PLAN:  1. Patient is to see Dr. Edilia Bo for followup.  She likely will need      some debridement of the wound.  She may be a vacuum candidate as      well.  2. After wound healing takes place, the patient is a candidate for a      K3 prosthesis from [employer].  They will use a silicon suspension      system with a VeriFlex foot.  The patient should do nicely.  Once      fitted, we will send her to outpatient physical therapy.  3. For her pain, I refilled Percocet 1 q.6h. p.r.n. #40.  4. I will see her back as needed here in the office.      Ranelle Oyster, M.D.  Electronically Signed     ZTS/MedQ  D:  12/05/2007 11:11:46  T:  12/05/2007 12:12:50  Job #:  [id number]   cc:   [name]. Edilia Bo, M.D.  [address]  [place]  [place] [address]

## 2011-01-26 NOTE — Assessment & Plan Note (Signed)
OFFICE VISIT   [name], [name]  DOB:  02-Aug-1956                                       11/07/2007  ZOXWR#:[id number]   I saw the patient in the office today for continued followup of her  right below the knee amputation which was performed on 09/27/2007.  She  had remaining staples removed in the office today.  She has some eschar  over the lateral aspect of the BKA, but there is no drainage or  erythema.  I have instructed her to keep a gauze over this are and that  she can place her stump-shrinker on top of this.  I would like to see  this healed before we allow her to be fitted for a prosthesis.  I will  see her back in 4 weeks.   [name]. Edilia Bo, M.D.  Electronically Signed   CSD/MEDQ  D:  11/07/2007  T:  11/08/2007  Job:  [id number]

## 2011-01-26 NOTE — Assessment & Plan Note (Signed)
OFFICE VISIT   [name], [name]  DOB:  17-Jan-1956                                       04/08/2009  AVWUJ#:[id number]   I saw the patient in the office today concerning her right below the  knee amputation.  She had undergone a right below the amputation in  January of 2009.  She developed an extensive wound but ultimately we  were able to get this to heal.  She got fitted for a prosthesis and  began wearing this in June and she has noted that she has had  significant pain associated with the amputation site when she is wearing  her prosthesis and when she takes it off she noted that her skin was  red.  She recently began wearing a stocking in-between this and it has  helped significantly.   On review of systems she has had no fever or chills.   On examination her blood pressure is 98/67, heart rate is 130.  The  stump is warm and adequately perfused.  There are no open areas and  appears to be intact without any problems.   Based on the history it sounds like she could be having some type of an  allergic reaction to the material in the prosthesis.  Wearing the  stocking seems to have helped.  She is going to check with [facility] today  and see if there is something she can wear that will protect her skin  from the material of the prosthesis.  She will call if this issue does  not resolve.   [name]. Edilia Bo, M.D.  Electronically Signed   CSD/MEDQ  D:  04/08/2009  T:  04/09/2009  Job:  [id number]

## 2011-01-26 NOTE — Assessment & Plan Note (Signed)
OFFICE VISIT   [name], [name]  DOB:  September 24, 1955                                       12/24/2008  AOZHY#:[id number]   I saw the patient in the office today for continued followup of her  right BKA.  She had an extensive wound and required placement of a VAC.  We then allowed the wound to granulate and this wound has now finally  healed.  There is one small area which had remained, when I saw her last  it was epithelializing and this has now completely epithelialized.  At  this point I think it is safe to have her fitted for a prosthesis and I  have referred her to [facility] for a right below-the-knee prosthesis.  I  will see her back in 3 months to see how she is doing with this.   [name]. Edilia Bo, M.D.  Electronically Signed   CSD/MEDQ  D:  12/24/2008  T:  12/25/2008  Job:  [id number]

## 2011-01-26 NOTE — Op Note (Signed)
[name], [name]               ACCOUNT NO.:  [id number]   MEDICAL RECORD NO.:  [id number]          PATIENT TYPE:  INP   LOCATION:  6736                         FACILITY:  [facility]   PHYSICIAN:  [name]. Edilia Bo, M.D.DATE OF BIRTH:  March 10, 1956   DATE OF PROCEDURE:  09/27/2007  DATE OF DISCHARGE:  10/03/2007                               OPERATIVE REPORT   PREOPERATIVE DIAGNOSIS:  Nonhealing right foot wound.   POSTOPERATIVE DIAGNOSIS:  Nonhealing right foot wound.   PROCEDURE:  Right below the knee amputation.   SURGEON:  [name]. Edilia Bo, M.D.   ASSISTANT:  Cyndy Freeze, PA.   ANESTHESIA:  Is general.   TECHNIQUE:  The patient was taken to the operating room and received a  general anesthetic.  The right lower extremity was prepped and draped in  the usual sterile fashion.  A tourniquet was placed on the upper thigh.  The circumference of the limb was measured 10 cm distal for the tibial  tuberosity and 2/3 of this distance was used to mark the anterior skin  flap.  A long posterior flap of equal length was then marked.  The leg  was exsanguinated with an Esmarch bandage then tourniquet inflated to  300 mmHg.  Then under tourniquet control the incision was carried down  to the skin, subcutaneous tissue, fascia and muscle to the tibia and  fibula which were dissected free circumferentially.  The periosteum was  elevated and the bone was divided proximal to the level of skin  division.  The anterior aspect of the tibia was beveled.  The fibula was  then divided.  The arteries and veins were individually suture ligated  with 2-0 silk ties.  The tourniquet was then released.  Additional  hemostasis was obtained using electrocautery and 2-0 silk ties.  The  edges of the bone were rasped.  The wound was irrigated with copious  amounts of saline.  The wound was closed with a deep layer of  interrupted 2-0 Vicryl.  The skin was closed with staples.  A sterile  dressing was applied.  The patient tolerated the procedure well and was  transferred to the recovery room in satisfactory condition.  All needle  and sponge counts were correct.      [name]. Edilia Bo, M.D.  Electronically Signed     CSD/MEDQ  D:  09/27/2007  T:  10/03/2007  Job:  [id number]

## 2011-01-26 NOTE — Discharge Summary (Signed)
[name], [name]               ACCOUNT NO.:  [id number]   MEDICAL RECORD NO.:  [id number]          PATIENT TYPE:  INP   LOCATION:  6736                         FACILITY:  [facility]   PHYSICIAN:  [name]. Edilia Bo, M.D.DATE OF BIRTH:  02-21-1956   DATE OF ADMISSION:  09/19/2007  DATE OF DISCHARGE:  10/03/2007                               DISCHARGE SUMMARY   DATE OF ADMISSION:  September 19, 2007   DATE OF DISCHARGE FROM VASCULAR SERVICES:  October 03, 2007   ADMISSION DIAGNOSIS:  Extensive infection of right great toe.   DISCHARGE DIAGNOSIS:  1. Extensive infection of right great toe and gangrene in diabetic      foot.  2. Nonhealing wound of the right great toe due to gangrene of a      diabetic foot infection after open right amputation of the right      great toe.   DISCHARGE DIAGNOSIS:  1. Gangrene of the right toe with diabetic foot infection.  2. Nonhealing right great toe after open right amputation.   SECONDARY DISCHARGE DIAGNOSIS:  1. Endstage renal disease secondary to diabetes.  2. Failed renal treatment.  3. Hypertension.  4. Dyslipidemia.  5. Peripheral vascular disease.   CONSULT:  1. Renal on September 19, 2007.  2. Pharmacy on September 20, 2007.  3. Rehab on September 21, 2007.  4. Physical therapy on September 22, 2007.  5. Nutrition on September 25, 2007.   PROCEDURES:  1. Ultrasound-guided left common femoral RD access done by Dr. Myra Gianotti      on September 19, 2007.  2. Abdominal aortogram by Dr. Myra Gianotti on September 19, 2007.  3. Right lower extremity runoff, third order cannulation, stent in      right superficial femoral artery, stent in right popliteal artery,      administration of nitroglycerin, retrograde left common femoral      artery angiogram, and closure device, StarClose.  These procedures      were done by Dr. Myra Gianotti on September 19, 2007.  4. Open right amputation of the right great toe done by Dr. Edilia Bo on      September 20, 2007.  5. Right below  knee amputation done on September 27, 2007, by Dr.      Edilia Bo.   BRIEF HISTORY AND PHYSICAL:  This is a 55 year old female with endstage  renal disease who has a longstanding superficial femoral artery disease.  She presented last week to clinic with a new toe ulcer. She comes back  in on September 19, 2007, for an arteriogram.  The risk and benefits were  discussed, and consent was signed at this time.  After arteriogram, it  was then found that there was a high-grade stenosis in the popliteal and  superficial arteries successfully treated by the stent and also a  dominant runoff vessel in the anterior tibial artery.  The posterior  tibial artery is occluded.  The peroneal artery is hanging down to the  ankle.  At this time, the patient was admitted with __________ infection  of the right great toe after the angio the day  before and the PTA stent  of the right SFA.  The patient now needed an amputation of the right  great toe that was open.  On examination of the toe, it had purulent  drainage, foul odor, and may involve other toes blistering her dorsum of  the foot.  Dr. Edilia Bo explained that it is possible that the foot  infection is much more sensitive than can be appreciated on exam and  that she could possibly even require an open forefoot amputation to  control the infection.  She is not agreeable to this and wishes to only  amputate the right great toe.  She understands that this meant that it  could entail her having to come back to the OR later for more surgery.  Dr. Edilia Bo answered all of her questions, and she was agreeable to  proceeding with the right great toe amputation.  After, following the  procedure, the patient was transferred to CC700 where she remained  stable overnight.  On post-op day 1, the pain was adequately controlled  by the patient, the dressings remained dry, and the patient's wound did  look okay after open right amputation of the right great toe.  At  this  time, the assessment and plan was to continue dressing changes 3 times a  day, and she was started on Zosyn Day 2.  Also, Dr. Edilia Bo felt to add  pulse lavage 6 times per week, and we will continue to monitor the toe.  On post-op day 2, Dr. Edilia Bo observed the foot, and the right foot open  toe amputation site looked worse September 22, 2007.  It was not as red and  tissue overlying the second and third toes on dorsum of foot now  involved.  She has had PTA of the SFA stenosis.  She has had some  popliteal and tibial __________ , but circulation is good as it gets and  no real option for improvement.  We continue Zosyn on day 3 and pulse  lavages and dressing changes with the hydrogel.  If wound progresses,  the only option at this point would be for a TMA versus a BKA.  Dr.  Edilia Bo did discuss this with her and her family.  The following next  day, post-op day #3, continue dressing changes and would continue to  monitor until Monday to make  a decision whether to proceed with a TMA  or BKA.  On post-op day 5, Dr. Edilia Bo saw the patient.  The patient did  not have any complaints, but there was no improvement in the open wound  of the right foot.  There was minimal granulation of the tissue and  exposed __________ questionable to the second toe.  Dr. Edilia Bo  explained that he did not think that this would heal despite aggressive  wound care, pulse lavage, plus IV antibiotics.  He recommended a TMA and  probably a 50 percent chance of healing versus a BKA.  She will consider  her options and let us know what her choice was to proceed.  On post-op  day #6, September 26, 2007, her white cell count continued to increase at  18.1, and the patient was on Zosyn now for the 7th day.  There was no  change in the right foot and no granulation. She is considering a right  BKA and continued to discuss with the patient including the 5% risk of  nonhealing.  She will discuss again with family and  will tentatively  schedule for September 27, 2007, surgery possibly of a BKA.  On September 27, 2007, the patient made the decision after contemplating and being  discussed and told and educated on all the risks and agreed to go ahead  with right BKA today.  Dr. Edilia Bo on September 27, 2007, took the patient  to the OR and did a right below knee amputation.  The patient tolerated  the procedure and was transferred to the PACU and then later transferred  back to the floor on CC700 where she continued to remain stable  overnight.  On post-op day 1 after right below knee amputation, the  patient continued to be on Zosyn IV day 9.  She complained of phantom  pain but currently was tolerating the pain.  The dressings were intact  with ACE.  There was drainage through the night but had stopped, and the  knee immobilizer was then placed and the extremity was elevated.  Assessment and plan at this time was to continue to elevate and continue  the Zosyn per pharmacy and keep knee immobilizer in place.  Dressings  would be changed on post-op day #2, September 29, 2007, and to continue to  control the pain with p.o. meds but contact if no improvement.  Then,  Dr. Edilia Bo later saw the patient and went ahead and did a dressing  change because there had been some ongoing oozing and redress.  On  September 29, 2007, post-op day 2, the patient was on day 10 of Zosyn.  Pain was better today.  Inspected the right BKA.  There was still some  oozing from medial skin edge, removed staple and Steri-Strips.  There  was some moderate swelling.  White blood count had started to go down at  14.6.  At this time, we were going to continue to monitor the patient,  the BKA looked okay, and to continue antibiotics and dressing changes.  Also, we consulted PT, OT, and rehab.  On October 02, 2007, post-op day  5 right BKA, the patient was comfortable and afebrile.  The right below  knee amputation looked fine, the white blood  cell count continued to go  down, and the hope was for the patient to be transferred to rehab soon.  Later in the day on October 03, 2007, rehab agreed that the patient was  capable of going to rehab at this time, so we are signing off from  patient and to transfer to rehab today.   On post-op day 6 on October 03, 2007, the patient was afebrile with a  temperature of 98.6, blood pressure 92/55, and heart rate was at 89.  The dressings were changed.  The BKA was inspected by Dr. Edilia Bo, and  it continues to look good and healing.  The white blood cell count now  had dwindled down below 13.  OT and PT had seen patient and rehab.  The  patient will be transferring now to rehab.  The patient will continue to  work with rehab and be under their services until discharge from  hospital.   DISCHARGE MEDICATIONS:  1. Combivent.  2. __________.  3. Vitamin B.  4. Prednisone.  5. Sensipar.   DISCHARGE INSTRUCTIONS:  The patient was explained to continue to clean  the wound with soap and water and elevate extremity, and the patient  will be working with rehab.  The patient was also instructed that she  will need to have staples removed on post-op 4 weeks  and will return to  Dr. Edilia Bo.  The patient was also instructed that she may shower and  bathe and increase ambulation with help of PT and OT as tolerated.  The  patient agreed to observe these instructions.  The patient is now  discharged under our services to rehab.      Cyndy Freeze, PA      [name]. Edilia Bo, M.D.  Electronically Signed    ALW/MEDQ  D:  10/03/2007  T:  10/03/2007  Job:  [id number]

## 2011-01-26 NOTE — Assessment & Plan Note (Signed)
OFFICE VISIT   [name], [name]  DOB:  1956/01/19                                       01/16/2008  OZHYQ#:[id number]   I saw the patient in the office today for continued followup of her  right BKA.  We have had to do frequent debridements of the wound on the  lateral aspect where there was some separation.  She has been doing  dressing change herself and comes in for routine visit.  The wound  actually looks to be granulating reasonably well although her  circulation is marginal.  I did some mild excisional debridement in the  office today.  The wound is 13 cm in length x 5 cm in width.  I think at  this point it would be worth trying a VAC to help promote contraction of  the wound and also promote granulation.  We will have the VAC changed on  Mondays, Wednesdays and Fridays and I will follow up with her in 2  weeks.   [name]. Edilia Bo, M.D.  Electronically Signed   CSD/MEDQ  D:  01/16/2008  T:  01/17/2008  Job:  [id number]

## 2011-01-26 NOTE — Assessment & Plan Note (Signed)
OFFICE VISIT   [name], [name]  DOB:  [birth year]                                       01/30/2008  [id number]#:[id number]   I saw the patient in the office today for continued followup of her  slowly healing right below the knee amputation.  I have been doing  frequent debridements on the lateral aspect of her wound where she had  some separation.  On 01/16/2008 I decided to try a VAC.  The initial  wound measured 13 cm in length x 5 cm in width.  She comes in today to  have her wound checked.  I did some more excisional debridement in the  office today.  There are areas of the wound that are granulating.  The  wound measures 13 cm in length x 4 cm in width.  I think the VAC is  stimulating some granulation tissue.  I plan on seeing her back in 2  weeks.  She will continue to have the VAC changed on Mondays, Wednesdays  and Fridays.   [name]. Edilia Bo, M.D.  Electronically Signed   CSD/MEDQ  D:  01/30/2008  T:  01/31/2008  Job:  [id number]

## 2011-01-26 NOTE — Op Note (Signed)
NAMEJODYE, [name]               ACCOUNT NO.:  [id number]   MEDICAL RECORD NO.:  [id number]          PATIENT TYPE:  AMB   LOCATION:  ENDO                         FAC[facility]   PHYSICIAN:  James L. Malon Kindle., M.D.DATE OF BIRTH:  [birth year]   DATE OF PROCEDURE:  01/23/2008  DATE OF DISCHARGE:                               OPERATIVE REPORT   PROCEDURE:  Polypectomy.   ENDOSCOPIST:  Llana Aliment. Randa Evens, M.D.   PREOPERATIVE DIAGNOSES:  __________   POSTOPERATIVE DIAGNOSES:  __________   DESCRIPTION OF PROCEDURE:  The procedure had been explained to the  patient and a consent obtained.  In the left lateral decubitus  position,The pediatric Pentax scope was inserted __________     She will be seen in my office for followup.           ______________________________  Llana Aliment. Malon Kindle., M.D.     [name]  D:  01/23/2008  T:  01/23/2008  Job:  [id number]

## 2011-01-26 NOTE — Assessment & Plan Note (Signed)
OFFICE VISIT   [name], [name]  DOB:  May 06, 1956                                       04/16/2008  [id number]#:[id number]   I saw the patient in the office today for continued followup of her  right below the knee amputation wound which has been gradually healing  with the VAC.  When I saw her last on 03/26/2008 the wound measured 9 cm  in length x 4 cm in width.  Today the wound measures 8 cm in length x  3.5 cm in width and is granulating nicely.   Overall I am pleased with progress we are making with the VAC.  As long  as we are making progress I think it is best to continue with this.  The  more we can get this to contract the better.  I think a skin graft is an  option, however, I think the skin would not be as strong and could  potentially be a problem when she later obtains a prosthesis.  I will  see her back in 3 weeks.  She knows to call sooner if she has problems.   [name]. Edilia Bo, M.D.  Electronically Signed   CSD/MEDQ  D:  04/16/2008  T:  04/17/2008  Job:  [id number]

## 2011-01-26 NOTE — Assessment & Plan Note (Signed)
OFFICE VISIT   MIIA, BLANKS  DOB:  November 01, 1955                                       07/02/2008  AOZHY#:[id number]   I saw the patient in the office today for continued followup of her  right below-the-knee amputation.  She had had some dehiscence on the  lateral aspect and has been undergoing dressing changes.  Currently she  is doing hydrogel daily.  The home health nurse comes three times a week  and she does the remaining dressing changes.  She continues to have some  pain associated with the wound.  She has had no fever or chills or  significant drainage.   PHYSICAL EXAMINATION:  On physical examination blood pressure is 119/70,  heart rate is 101.  The wound is granulating nicely.  There is no  significant drainage.  It measures 5 cm in length x 3 cm in width and is  continuing to contract.  Overall I am pleased with her progress.  We  will continue with her dressing changes and I plan on seeing her back in  6 weeks.  Once this is healed we can get her fitted for a prosthesis.  I  have given her a prescription for 30 Tylox for pain.   [name]. Edilia Bo, M.D.  Electronically Signed   CSD/MEDQ  D:  07/02/2008  T:  07/03/2008  Job:  [id number]

## 2011-01-26 NOTE — Assessment & Plan Note (Signed)
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  Dr. Darrick Penna was concerned  about the skin thinning and she was sent to have this evaluated.  Of  note, she dialyzes on Mondays, Wednesdays and Fridays.   REVIEW OF SYSTEMS:  On review of systems she has had no recent fever or  chills.  She has had no chest pain, chest pressure or significant  shortness of breath.   PHYSICAL EXAMINATION:  On physical examination this is a pleasant 55-  year-old woman who appears her stated age.  Her blood pressure is 96/63,  heart rate is 98.  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  There is a second aneurysm that is smaller above this.  She has a palpable brachial and radial pulse bilaterally.   We did do vein mapping of her right upper extremity in the office today  which shows what appears to be a reasonable forearm and upper arm  cephalic vein on the right.   We discussed our options today in the office.  I have explained I think  the aneurysm needs to be addressed because of the risk of continued  enlargement and skin compromise.  I have explained that there is really  no way to salvage the fistula and allow adequate room for the fistula to  still be cannulated.  For this reason I think the options are to place a  graft in the left arm and excise the aneurysm and place a Diatek  catheter versus placing new access in the right arm and then addressing  the aneurysm once the new access is mature.  She feels quite strongly  about not having a catheter and would like to have attempted fistula in  the right arm.  We  did map her vein and it looks reasonable.  She would  like to not have surgery, however, until January 7.  We will place a new  access in the right arm at that point and then once this is mature we  can remove her large aneurysm in the left arm.  If she has any problems  with her fistula before that we will proceed sooner.   [name]. Edilia Bo, M.D.  Electronically Signed   CSD/MEDQ  D:  08/27/2008  T:  08/28/2008  Job:  [id number]   cc:   Fayrene Fearing L. Deterding, M.D.

## 2011-01-26 NOTE — Op Note (Signed)
[name], [name]               ACCOUNT NO.:  [id number]   MEDICAL RECORD NO.:  [id number]          PATIENT TYPE:  OIB   LOCATION:  6733                         FACILITY:  [facility]   PHYSICIAN:  [name]. Edilia Bo, M.D.DATE OF BIRTH:  1956-01-12   DATE OF PROCEDURE:  09/20/2007  DATE OF DISCHARGE:                               OPERATIVE REPORT   PREOPERATIVE DIAGNOSIS:  Gangrene of the right great toe with a diabetic  foot infection.   POSTOPERATIVE DIAGNOSIS:  Gangrene of the right great toe with a  diabetic foot infection.   PROCEDURE:  Open ray amputation of the right great toe.   SURGEON:  [name]. Edilia Bo, M.D.   ASSISTANT:  Nurse.   ANESTHESIA:  Ankle block.   ESTIMATED BLOOD LOSS:  Minimal.   TECHNIQUE:  The patient was taken to the operating room after an ankle  block was placed.  The right foot was prepped and draped in the usual  sterile fashion.  A tennis racket incision was made encompassing the  right great toe extending back to the metatarsal.  The metatarsal was  divided using a saw, and the entire toe was removed in its entirety.  Intraoperative cultures were obtained.   There was some tracking of the infection along the tendon sheath.  The  tendon was retracted into the wound and divided.  All devitalized tissue  was removed.  Hemostasis was obtained using electrocautery.  The wound  was then packed with a moist 4 x 4 and a sterile dressing was applied.  The patient tolerated the procedure well and was transferred to the  recovery room in satisfactory condition.  All needle and sponge counts  were correct.      [name]. Edilia Bo, M.D.  Electronically Signed     CSD/MEDQ  D:  09/20/2007  T:  09/20/2007  Job:  [id number]

## 2011-01-26 NOTE — Assessment & Plan Note (Signed)
OFFICE VISIT   [name], [name]  DOB:  05/18/1956                                       01/02/2008  EAVWU#:[id number]   I saw the patient in the office today for continued followup of her  wound on her right below the knee amputation site.  I did some more  excisional debridement in the office today and it appears to be getting  down to some reasonable granulation tissue.  Once it is cleaner, we can  consider putting a vac on this to help get it to close.  She is doing  dressing changes b.i.d. and this seems to be helping.  This is being  done wet-to-dry.   [name]. Edilia Bo, M.D.  Electronically Signed   CSD/MEDQ  D:  01/02/2008  T:  01/03/2008  Job:  [id number]

## 2011-01-26 NOTE — Discharge Summary (Signed)
NAMELILLYEN, SCHOW NO.:  [id number]   MEDICAL RECORD NO.:  [id number]          PATIENT TYPE:  IPS   LOCATION:  4155                         FACILITY:  [facility]   PHYSICIAN:  Ellwood Dense, M.D.   DATE OF BIRTH:  [birth year]   DATE OF ADMISSION:  10/03/2007  DATE OF DISCHARGE:  10/11/2007                               DISCHARGE SUMMARY   DISCHARGE DIAGNOSES:  1. Right below-the-knee amputation January 14 secondary to peripheral      vascular disease.  2. Positive wound cultures January 9 with intravenous vancomycin and      Zosyn completed.  3. End-stage renal disease with hemodialysis.  4. Insulin-dependent diabetes mellitus.  5. Hypertension.  6. Nonischemic cardiomyopathy.  7. Chronic anemia.  8. Subcutaneous Lovenox for deep vein thrombosis prophylaxis.   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  The patient was  admitted January 6 with extensive infection of the right toe.  Angiogram  with superficial femoral artery disease.  Underwent right open ray great  toe amputation January 7 per Dr. Edilia Bo, placed on subcutaneous Lovenox  for deep vein thrombosis prophylaxis. Ongoing gangrenous changes, right  foot.  Positive wound cultures.  Placed on intravenous vancomycin and  Zosyn.  Poor granulation of the surgical site.  Underwent right below-  knee amputation September 27, 2007.  Dialysis ongoing per [facility].   PAST MEDICAL HISTORY:  See discharge diagnoses.  No alcohol or tobacco.   ALLERGIES:  VYTORIN.   SOCIAL HISTORY:  Lives alone.  Plans to stay with her brother, who  works.  The patient is on disability.  She plans to move to a first  floor handicap apartment.   MEDICATIONS PRIOR TO ADMISSION:  Rena-Vite, Renagel, prednisone,  Sensipar, 70/30 insulin, Lipitor and hydroxyzine.   REHABILITATION HOSPITAL COURSE:  The patient was admitted to inpatient  rehab services with therapies initiated on a 3-hour daily basis  consisting of physical therapy, occupational therapy, and rehabilitation  nursing.  The following issues were addressed during the patient's  rehabilitation stay:   Pertaining to Ms. Bessire' right below-knee amputation January 14:  Surgical site healing slowly, followed by Dr. [name]. Edilia Bo of  cardiothoracic surgery.  Plan would be to be seen in the outpatient  amputee clinic in the near future.  She had completed a course of  vancomycin and Zosyn for positive wound culture.  She remained afebrile  throughout her course.  She will continue on dialysis as per [facility].  They were aware of her upcoming discharge.  Blood  sugars monitored while on 70/30/insulin.  Overall blood sugars were  acceptable.  The latest blood sugars 67, 124, 74 and 108.  Blood  pressure was monitored on no current antihypertensive medication.  She  did have a documented history of nonischemic cardiomyopathy.  She  remained on Plavix therapy.  Throughout her rehab course, she was on  subcutaneous Lovenox for deep vein thrombosis prophylaxis.  Functionally  she was minimum to moderate assist for transfers, mod assist for gait of  5 feet with poor tolerance, supervision to minimal assist for basic  activities of daily living.  Overall strength and endurance showed slow  progressive gain.  She was discharged to home with family.   DISCHARGE MEDICATIONS AT THE TIME OF DICTATION:  1. Plavix 75 mg daily.  2. Hectorol 5 mcg Monday, Wednesday, Friday dialysis.  3. Klonopin 0.5 mg at bedtime.  4. Nephro-Vite 1 tablet daily.  5. Renagel 2400 mg 3 times daily.  6. Sensipar 90 mg daily.  7. Insulin 70/30, 28 units in the a.m., 23 units in the p.m.  8. Prednisone 5 mg daily.  9. Aranesp 50 mcg weekly with dialysis.  10.PhosLo 667 mg twice daily.  11.Oxycodone  immediate release 5 mg 1 to 2 tablets every 4 hours as      needed for pain, dispensed 90 tablets.   DIET:  Renal diet.   SPECIAL INSTRUCTIONS:  Continue dialysis as per [facility].  Follow up with  Dr. [name]. Dickson in 2 weeks after  recent right below-knee amputation.  Call with any increased redness,  drainage or fever.  Plans to be made in the outpatient amputee clinic in  the near future for prosthetic teaching.      Mariam Dollar, P.A.    ______________________________  Ellwood Dense, M.D.    DA/MEDQ  D:  10/10/2007  T:  10/10/2007  Job:  [id number]   cc:   Ellwood Dense, M.D.  [facility]  [name]. Edilia Bo, M.D.

## 2011-01-26 NOTE — Assessment & Plan Note (Signed)
OFFICE VISIT   [name], [name]  DOB:  08/16/1956                                       10/24/2007  NWGNF#:[id number]   I saw the patient in the office today for followup of her right below  the knee amputation.  She had presented with right foot ischemia and  gangrene of the right great toe.  She also ultimately required right  below the knee amputation.  This was done on 09/27/2007.  She returns  for her first followup visit.  She has no complaints referable to the  amputation site.  She has had no fevers or chills.   PHYSICAL EXAMINATION:  Blood pressure is 137/85, heart rate is 100.  The  incision appears to be healing adequately.  There is some slight eschar.  We removed her staples in the office today and placed Steri-Strips.  I  have written her a prescription for stump shrinker.  I will see her back  in 2 weeks and, at that time, if the wound looks good, we can get her  fitted for a prosthesis.   [name]. Edilia Bo, M.D.  Electronically Signed   CSD/MEDQ  D:  10/24/2007  T:  10/26/2007  Job:  [id number]

## 2011-01-26 NOTE — Assessment & Plan Note (Signed)
OFFICE VISIT   [name], [name]  DOB:  November 28, 1955                                        April 21, 2010  CHART #:  [id number]   HISTORY:  The patient is a 55 year old woman with multiple medical  problems including end-stage renal disease, on dialysis.  She had single-  vessel coronary disease, severe LAD disease, moderate right coronary  disease, and severe dilated cardiomyopathy.  She underwent coronary  bypass grafting x3 where her LAD and diagonal were grafted as well as a  posterior descending branch.  She rested when they put her to sleep as  we had to do CPR open emergently and go on pump and then do the case.  She had a very difficult initial postoperative course and was on  ventilator for prolonged period of time, but did eventually wean from  that, then had some altered mental status, some delirium, and confusion  which subsequently resolved.  She was finally discharged from the  hospital on March 31, 2010.  She now returns for followup.  She says she  is doing very well at this point in time.  She is tolerating her  dialysis.  She has been walking on a daily basis.  She had home physical  therapy, but she exceeded all their goals for, so they are not really  working with her anymore.  She is doing most everything on her own and  she is very pleased with her progress.  She is not having any incisional  pain.   CURRENT MEDICATIONS:  1. Daily vitamins, Coreg 3.125 mg b.i.d.  2. Enteric-coated aspirin 81 mg daily.  3. Sensipar 90 mg tablets 1/2 tablet daily.  4. Lyrica 50 mg p.o. nightly.  5. Renvela 800 mg tablets 3 tablets p.o. t.i.d.  6. Lantus insulin 8 units subcu nightly.  7. NovoLog insulin sliding scale.  8. Midodrine 10 mg p.o. t.i.d.   PHYSICAL EXAMINATION:  General:  The patient is a 56 year old woman, in  no acute distress.  Vital Signs:  Her blood pressure is 106/64, pulse  99, respirations are 18, and her oxygen saturation is 97% on  room air.  Lungs:  Clear with equal breath sounds bilaterally.  Cardiac:  Regular  rate and rhythm.  Normal S1 and S2.  There is no S4.  There is no  murmur.  Her sternal incision is clean, dry, and intact.  There is  minimal eschar in the midportion of the incision.  No signs of  infection.  The sternum is stable.  Her leg wounds are healing well.   IMPRESSION:  The patient is a 55 year old woman status post coronary  bypass grafting x3.  She has severe ischemic cardiomyopathy.  Of  interest, her last echo before she left the hospital showed marked  improvement of her left ventricular function, obviously to see if that  is maintained.  Certainly, she is doing as well as could be expected at  this point in time.  She looks great.  She feels well.  She is  tolerating dialysis that I could not be any happier with her progress.  Her activities are unrestricted at this time.  She is not taking any  pain medications.  She has not seen Dr. Katrinka Blazing since discharge, but she  is going to arrange a followup appointment with  him as soon as possible.  I would be happy to see her back anytime if I can be of any further  assistance with her care.   Salvatore Decent Dorris Fetch, M.D.  Electronically Signed   SCH/MEDQ  D:  04/21/2010  T:  04/22/2010  Job:  [id number]   cc:   Lyn Records, M.D.  Deirdre Peer. Polite, M.D.  James L. Deterding, M.D.

## 2011-01-26 NOTE — Assessment & Plan Note (Signed)
OFFICE VISIT   LAINI, URICK  DOB:  [birth year]                                       09/10/2008  DGUYQ#:[id number]   I saw the patient in the office today for continued followup of her  wound on her right below the knee amputation.  It now measures 2 cm in  width and 1.5 cm in height and is granulating nicely.  This wound is now  almost completely healed.  She is having no significant pain associated  with this.  She has had no fever or chills.  She has a small wound at  her pretibial area and this also appears to be gradually improving.   She was scheduled to have a new AV fistula placed in the right arm on  01/07 and then once this is matured and usable we will work on the  aneurysm in her left arm as per my previous office note.  When she comes  in for a 6 week followup visit to check on the maturation of her fistula  we will check on her wound on the right BKA.   [name]. Edilia Bo, M.D.  Electronically Signed   CSD/MEDQ  D:  09/10/2008  T:  09/11/2008  Job:  [id number]

## 2011-01-26 NOTE — Assessment & Plan Note (Signed)
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   HISTORY:  This is a 55 year old female with end-stage renal disease, who  was sent over from dialysis for evaluation of aneurysm.  She underwent a  left arm fistula many years ago.  She has not had any difficulty with  her access.  She comes in today for discussion and evaluation of  aneurysmal degeneration.   PHYSICAL EXAMINATION:  Pulse 106, respirations 18, blood pressure 94/68.  General:  She is well-appearing in no acute distress.  Left arm:  The  fistula is easily palpated from the antecubital fossa up to the axilla.  There are two areas of aneurysmal change.  The largest is closest to the  elbow.  There is no ulceration associated with this area.  There is some  pigment change, however.   ASSESSMENT:  Aneurysmal degeneration of left arm fistula.  I discussed  with the patient that as long as there is no ulceration or difficulty  with hemostasis, there was no urgency in treating this.  In fact,  treating this problem could cause her to lose her access.  There is no  evidence of infection currently.  I do, however, feel that the patient  is a candidate for interposition graft through the area of aneurysm.  I  think this could be done and still access the graft above the  interposition graft.  She is not ready to make any decisions regarding  an operation.  Since she is a  patient of Dr. Adele Dan, I will have her follow up in his clinic for  further management of this problem in 1-2 months' time.   Jorge Ny, MD  Electronically Signed   VWB/MEDQ  D:  07/17/2007  T:  07/18/2007  Job:  [id number]

## 2011-01-26 NOTE — Assessment & Plan Note (Signed)
OFFICE VISIT   LACRECIA, DELVAL  DOB:  11/17/1955                                       05/28/2008  AOZHY#:[id number]   I saw the patient in the office today for continued followup of her  slowly healing right below-the-knee amputation.  This procedure was  performed on 09/27/2007 and she developed some dehiscence to the lateral  aspect of the wound.  She has been undergoing dressing changes for some  time and on our most recent visit on 05/07/2008, we elected to  discontinue the vac as the wound was not getting any smaller.  She comes  in for a routine followup visit.  She has had no fever or chills.  She  has no specific complaints.   PHYSICAL EXAMINATION:  Blood pressure is 118/79.  The wound is  granulating nicely and is now 6 cm in length x 3.2 cm in width.  Thus,  it has decreased from 8 x 3.5.   I think we will continue with the hydrogel dressing changes and hope  that this will continue to contract.  I have explained the advantage of  avoiding a skin graft is I think the skin will be tougher for her  prosthesis eventually.  I plan on seeing her back in 4 weeks.   [name]. Edilia Bo, M.D.  Electronically Signed   CSD/MEDQ  D:  05/28/2008  T:  05/30/2008  Job:  [id number]

## 2011-01-26 NOTE — Procedures (Signed)
OPERATIVE REPORT   [name], [name]  DOB:  1956/03/07                                       09/27/2007  EAVWU#:[id number]   PREOPERATIVE DIAGNOSIS:  Nonhealing right foot wound.   POSTOPERATIVE DIAGNOSIS:  Nonhealing right foot wound.   PROCEDURE:  Right below the knee amputation.   SURGEON:  [name]. Edilia Bo, M.D.   ASSISTANT:  Cyndy Freeze, PA.   ANESTHESIA:  Is general.   TECHNIQUE:  The patient was taken to the operating room and received a  general anesthetic.  The right lower extremity was prepped and draped in  the usual sterile fashion.  A tourniquet was placed on the upper thigh.  The circumference of the limb was measured 10 cm distal for the tibial  tuberosity and 2/3 of this distance was used to mark the anterior skin  flap.  A long posterior flap of equal length was then marked.  The leg  was exsanguinated with an Esmarch bandage then tourniquet inflated to  300 mmHg.  Then under tourniquet control the incision was carried down  to the skin, subcutaneous tissue, fascia and muscle to the tibia and  fibula which were dissected free circumferentially.  The periosteum was  elevated and the bone was divided proximal to the level of skin  division.  The anterior aspect of the tibia was beveled.  The fibula was  then divided.  The arteries and veins were individually suture ligated  with 2-0 silk ties.  The tourniquet was then released.  Additional  hemostasis was obtained using electrocautery and 2-0 silk ties.  The  edges of the bone were rasped.  The wound was irrigated with copious  amounts of saline.  The wound was closed with a deep layer of  interrupted 2-0 Vicryl.  The skin was closed with staples.  A sterile  dressing was applied.  The patient tolerated the procedure well and was  transferred to the recovery room in satisfactory condition.  All needle  and sponge counts were correct.   [name]. Edilia Bo, M.D.  Electronically Signed   CSD/MEDQ  D:  09/27/2007  T:  09/27/2007  Job:  [id number]

## 2011-01-26 NOTE — H&P (Signed)
HISTORY AND PHYSICAL EXAMINATION   February 16, 2010   Re:  [name], [name]         DOB:  [birth year]   REASON FOR CONSULTATION:  Consideration for coronary bypass grafting.   HISTORY OF PRESENT ILLNESS:  The patient is a 55 year old woman with  multiple medical problems including insulin-dependent diabetes,  peripheral vascular disease status post right BKA, hypertension,  hyperlipidemia, chronic renal failure on hemodialysis status post failed  cadaveric renal transplant in 2006, and ischemic cardiomyopathy,  claudication in her left leg.   She comps of shortness of breath at rest and with minimal exertion. This  has been getting progressively worse. She also has had significant  issues with hypotension  during dialysis, which at times has limited the  effectiveness of dialysis. She does not have any chest pain.   PAST SURGICAL HISTORY:  Right below-the-knee amputation in 2009,  cadaveric renal transplant in 2006, left arm AV fistula, ovarian cyst.   CURRENT MEDICATIONS:  1. Nephro-Vite 1 daily.  2. Hydroxyzine 25 mg daily.  3. Plavix 75 mg daily.  4. Lantus insulin 32 units subcu at bedtime.  5. NovoLog sliding scale insulin 3 times daily with meals.  6. Vitamin B complex 2400 mg p.o. t.i.d.  7. Sensipar 45 mg daily.  8. Lyrica 50 mg daily.   ALLERGIES:  She has an allergy to Vytorin, which causes swelling and  itching.   FAMILY HISTORY:  Significant for cardiovascular disease in both father  and mother in their late 1s, early 90s, as well as siblings with  history of diabetes and MI.   SOCIAL HISTORY:  She is single.  She lives alone.  She does have family  in the area.  She does not smoke or drink alcohol.  She is disabled.   REVIEW OF SYSTEMS:  The patient notes shortness of breath both at rest  and exertion, palpitations, or heart beating rapidly.  Claudication in  the left leg, cramping in the calf which resolves with rest and massage.  She states  she had an ultrasound of her left leg to evaluate the  circulation at Dr. Hoy Finlay office.  She has a followup appointment  with him on June 30.  She describes her symptoms of shortness of breath  at rest associated with a rapid heartbeat, this also occurs with  exertion.   PHYSICAL EXAMINATION:  General:  The patient is a 55 year old African  American female, in no acute distress.  Vital Signs:  Blood pressure is  107/67, pulse 105, respirations are 18.  Her oxygen saturation is 91% on  room air.  Neurologic:  She is alert, oriented x3 with no focal  deficits.  HEENT:  Unremarkable.  Neck:  Without bruits, thyromegaly or  adenopathy.  Cardiac:  Regular rate and rhythm.  Normal S1 and S2.  There is no rub or murmur.  Lungs: Clear with equal breath sounds  bilaterally.  Abdomen:  Soft and nontender.  There are well-healed  surgical scars.  Extremities:  Her left upper extremity has an AV  fistula with a thrill.  She has a prosthesis on her right leg.  Her left  leg has no palpable pulses below the femoral.   LABORATORY DATA:  Her myocardial imaging with SPECT performed, Feb 04, 2010, shows a defect in the inferoseptal wall.  There is partial  reversal on the 4 all delayed images.  It does appear to be viability in  the anterior wall.  There  is a focal fixed defect in the lateral wall.  In cardiac catheterization, there is moderate-to-severe inferior wall  hypokinesis.  There is total occlusion of the LAD but on the first  septal perforator, there is a 90% stenosis just distal to the  bifurcation of a large trifurcating first diagonal.  Circumflex is  normal.  The right coronary has 40%-50% mid stenosis.  There is an  eccentric 70%-80% stenosis distally, ejection fraction is estimated 25%-  30%.  Echocardiogram on December 11, 2009, shows an EF of 15%-20%, severe  mitral annular calcification with only mild mitral regurgitation.  There  is aortic valve sclerosis.   IMPRESSION:  The  patient is a 55 year old woman with multiple cardiac  risk factors.  She is an end stage renal dialysis patient with diabetes,  hypertension and hyperlipidemia.  She also has peripheral vascular  disease, which is severe given her right below-the-knee amputation as  well as claudication and evidence of significant compromise to  circulation in her left lower extremity.  She has essentially single  vessel coronary disease and complex involvement of the LAD, which is  totally occluded and also has stenoses in a complex trifurcating first  diagonal vessel.  She is symptomatic with shortness of breath both at  exertion and at rest.  I do think it would be reasonable to do a  revascularization of the LAD and diagonals.  Although, there is no  guarantee in her case that will improve her shortness of breath.  I  do  think that given her diabetes, this may be a manifestation of silent  angina.  Certainly, there are other factors which could be contributing  as well.  I do think her dialysis going to be difficult to manage and  that is really what led to the surgical referral with the fact that she  really cannot tolerate nitrates as there already having difficulty  dialyzing her given her low blood pressure.   I have discussed in detail with the patient the indications, risks,  benefits and alternative treatments.  She understands the risks include  but not limited to death, stroke, myocardial infarction, deep venous  thrombosis, pulmonary embolism, bleeding, possible need for  transfusions, infections as well as other organ system dysfunction,  including respiratory or GI complications.  She does understand given  her renal failure, diabetes and peripheral vascular disease that she is  at increased risk for perioperative complications for death.   I think these issues, however discussed with the family and possibly  discussed with Dr. Katrinka Blazing again prior to making decisions whether to  proceed.   We will need to have her off her Plavix for a week prior to  surgery.   Salvatore Decent Dorris Fetch, M.D.  Electronically Signed   SCH/MEDQ  D:  02/16/2010  T:  02/17/2010  Job:  [id number]   cc:   Lyn Records, M.D.  James L. Deterding, M.D.  Deirdre Peer. Polite, M.D.

## 2011-01-26 NOTE — H&P (Signed)
HISTORY AND PHYSICAL EXAMINATION   September 15, 2007   Re:  [name], [name]               DOB:  Jun 10, 1956   ADMISSION DIAGNOSIS:  Right foot ischemia with gangrene right great toe.   HISTORY OF PRESENT ILLNESS:  The patient is a 55 year old female with  long history of end-stage renal disease.  She has stable lower extremity  arterial insufficiency and has been followed by Dr. Waverly Ferrari  for lower extremity arterial insufficiency.  She had undergone  noninvasive vascular laboratory studies and has been counseled to  continue her walking program and to notify us should she develop any  ulceration or rest pain or disabling claudication.  She presents today  to our office with gangrene of her right great toe.  She reports this  initially began as a blister around Thanksgiving and has had progressive  changes.  She has been treated initially conservatively with Dr. Ralene Cork  with serial debridements.  She has had progression and is now seen for  possible revascularization.  She has no tissue loss on her left foot.   PAST MEDICAL HISTORY:  Significant for chronic renal insufficiency.  She  dialyzes on Monday, Wednesday and Friday.  She does have adult onset of  insulin dependent diabetes.  She has hypertension.  She does not have  any history of elevated cholesterol and does not have any cardiac  history and no congestive heart failure.   FAMILY HISTORY:  Brother died of a myocardial infarction at age 34.  No  other pertinent atherosclerotic disease.   SOCIAL HISTORY:  She is single.  She does not have any children.  She  does not use tobacco.   REVIEW OF SYSTEMS:  Positive shortness of breath with exertion and lying  flat.  She does have leg pain with walking.   MEDICATIONS:  1. Nephrovite one daily.  2. Aspirin 81 mg daily.  3. Insulin NovoLog 70/30 25 units q.a.m., 12 units q.p.m.  4. Lipitor 10 mg once daily.  5. Hydroxyzine 25 mg one t.i.d.   PHYSICAL EXAMINATION:  Vital signs:  Blood pressure is 110/70, pulse  109, respirations 18.  Chest:  Clear bilaterally.  Heart:  Regular rate  and rhythm.  No murmurs.  Abdomen:  Soft.  Extremities:  Palpable  femoral pulses bilaterally and popliteal and distal pulses.  Her right  foot is noted for full thickness skin loss from the area of the web  space of her great toe distally.  There is some mild erythema of the  first metatarsal head and no evidence of fluctuance or pain or infection  in the arch of her foot.   I discussed the significance of this at length with the patient and her  family present.  I have recommended that she proceed with arteriography  to determine if bypass is available followed by bypass and ray  amputation of her right great toe.  She will undergo her usual dialysis  on Monday, 09/18/2007, be admitted for arteriography to [facility] on Tuesday, 01/06 and will have surgery as indicated on 01/07.  We will notify [facility] for her to have dialysis on  Tuesday afternoon following her arteriography to be prepared for surgery  on Wednesday.   Larina Earthly, M.D.  Electronically Signed   TFE/MEDQ  D:  09/15/2007  T:  09/18/2007  Job:  [id number]   cc:   Alvan Dame, D.P.M.  James L. Deterding, M.D.

## 2011-01-26 NOTE — Assessment & Plan Note (Signed)
OFFICE VISIT   [name], [name]  DOB:  [birth year]                                       03/05/2008  [id number]#:[id number]   I saw the patient in the office today for continued followup of her  wound on her right BKA.  She had a right below-the-knee amputation in  January of this year and then had dehiscence of the lateral wound.  She  has undergone extensive outpatient wound care and currently we are using  the VAC.  She has had some odor previously which has resolved.  She is  requiring less frequent debridements.   PHYSICAL EXAMINATION:  On examination the wound is granulating nicely  and measures 10 cm in length x 4 cm in width.  It is clearly smaller.  There is excellent granulation tissue and I just did some very minimal  excisional debridement in the office today.   She complains that the dressing sticks and therefore will use Mepitel  with her VAC dressing changes.  I plan on seeing her back in 4 weeks.  I  have given her a prescription for 30 Vicodin for pain.   [name]. Edilia Bo, M.D.  Electronically Signed   CSD/MEDQ  D:  03/05/2008  T:  03/06/2008  Job:  [id number]

## 2011-01-26 NOTE — Op Note (Signed)
[name], [name]               ACCOUNT NO.:  [id number]   MEDICAL RECORD NO.:  [id number]          PATIENT TYPE:  OIB   LOCATION:  6733                         FACILITY:  [facility]   PHYSICIAN:  Juleen China IV, MDDATE OF BIRTH:  1956/02/12   DATE OF PROCEDURE:  09/19/2007  DATE OF DISCHARGE:                               OPERATIVE REPORT   PREOPERATIVE DIAGNOSIS:  Right toe ulcer.   POSTOPERATIVE DIAGNOSIS:  Right toe ulcer.   PROCEDURES PERFORMED:  1. Ultrasound-guided left common femoral artery access.  2. Abdominal aortogram.  3. Right lower extremity runoff.  4. Third-order cannulation.  5. Stent in right superficial femoral artery.  6. Stent in right popliteal artery.  7. Administration of nitroglycerin.  8. Retrograde left common femoral artery angiogram.  9. Closure device (StarClose).   INDICATIONS:  This is a 55 year old female with end-stage renal disease  who has a long-standing superficial femoral artery disease.  She  presented last week to clinic with a new toe ulcer.  She comes back in  today for arteriogram.  Risks and benefits were discussed and informed  consent was signed.   PROCEDURE:  The patient was identified in the holding area and taken to  room #7.  She was placed supine on the table.  The bilateral groins were  prepped and draped in standard sterile fashion.  The left common femoral  artery was evaluated with ultrasound and found to be widely patent.  1%  lidocaine was used for local anesthesia.  The left common femoral artery  was accessed with an 18-gauge needle.  An 0.035 wire was advanced in  retrograde fashion into the abdominal aorta under fluoroscopic  visualization.  A 5-French sheath was then placed.  An Omni Flush  catheter was advanced through the wire at the level L1 and an abdominal  aortogram was obtained.  Next, the catheter was pulled down to the  aortic bifurcation, and a pelvic angiogram was obtained.  Using a  Bentson wire  and an Omni Flush catheter, the aortic bifurcation was  crossed.  The catheter was placed in the right external iliac artery and  the right lower extremity runoff was obtained.   FINDINGS:  Aortogram:  The visualized portions of suprarenal abdominal  aorta showed minimal disease.  There is a left renal artery, which is  visualized and widely patent.  There is diffuse disease within the right  renal artery.  The infrarenal abdominal aorta was widely patent.  Bilateral common iliac arteries are widely patent.  Bilateral external  iliac arteries are widely patent.  Bilateral hypogastric arteries are  widely patent.   Right lower extremity:  The right common femoral artery is widely patent  with minimal disease.  The right profunda femoris artery is widely  patent.  The right superficial femoral artery is calcified, but patent  proximally.  Just proximal to the bone edge, there was an area of  approximately 95% stenosis, and distal to this in the popliteal artery  there is an area of approximately 50% stenosis.  The popliteal artery is  mildly diseased with some  luminal narrowing of approximately 40% behind  the knee.  The dominant runoff vessel is the anterior tibial artery.  The tibioperoneal truncus is patent.  The perineal artery is also patent  down to the ankle with posterior tibial arteries occluded.   INTERVENTION:  After the above images were obtained, the decision was  made to intervene.  The Omni Flush catheter and 5-French sheath were  removed, and a Rosen wire and a 6-French 40-cm Terumo sheath was placed  in the right external iliac artery.  At this point in time, the patient  was systemically heparinized.  Using a Glidewire and a quick-cross  catheter, the right superficial femoral artery was selected.  This was  advanced down to the level of the stenosis.  The Glidewire was able to  successfully cross the stenosis.  The quick-cross catheter was placed  across the stenosis  and a contrast injection was performed confirming  successful reentry into the popliteal artery.  Rosen wire was placed and  the quick-cross catheter was removed.  A 7 x 80 EV3 self-extending stent  was selected.  This advanced over the wire placed in the proximal  popliteal and distal superficial femoral artery and then deployed across  the area of the stenosis.  This was moulded to confirmation with a 6-mm  balloon.  Follow-up arteriogram revealed successful resolution of the  stenosis within the popliteal and superficial femoral artery.  Follow-up  angiogram reveals widely patent superficial femoral popliteal artery.  However, behind the knee, there appears to be an area of luminal  narrowing likely consistent with spasm secondary to the wire being in  that position.  For that reason, a quick-cross catheter was advanced  over the Rosen wire and 400 mcg of nitroglycerin was administered.  An  additional angiogram was taken, which showed improvement in the flow  across the popliteal artery and additional 400 mg of nitroglycerin was  then administered and a follow-up arteriogram was performed.  This  showed improvement.  Again, the runoff after the intervention was same  as prior to the intervention.  At this point in time, the catheter and  sheath were pulled back into the left groin.  A retrograde left femoral  angiogram was performed to evaluate for closure device and found to be  adequate.  A StarClose was successfully deployed.  The patient tolerated  the procedure well.  There are no complications.   IMPRESSION:  1. High-grade stenosis within the right popliteal and superficial      femoral artery successfully treated by stent.  2. Dominant runoff vessel is the anterior tibial artery.  The      posterior tibial artery is occluded.  The perineal artery is patent      down the ankle.           ______________________________  V. Charlena Cross, MD  Electronically Signed      VWB/MEDQ  D:  09/19/2007  T:  09/19/2007  Job:  [id number]

## 2011-01-26 NOTE — Assessment & Plan Note (Signed)
OFFICE VISIT   [name], [name]  DOB:  04-28-1956                                       11/19/2008  [id number]#:[id number]   I saw the patient in the office today to check on her right below the  knee amputation site.  This was done in January of 2009.  However, she  developed an extensive wound which we treated for some time with the [facility]  and now the wound has almost completely healed.  She wanted to have this  checked before having a prosthesis placed.  The wound has contracted  nicely and now there is only a 6 mm area in diameter that is still  granulating and is not completely epithelialized.   I explained to her I think that this needs to completely heal before she  being using her prosthesis and for this reason she will continue doing  the dressing changes with hydrogel and I will see her back in 3-4 weeks.  If the wound is completely healed at that point we can arrange for her  to work on her prosthesis.   [name]. Edilia Bo, M.D.  Electronically Signed   CSD/MEDQ  D:  11/19/2008  T:  11/20/2008  Job:  [id number]

## 2011-01-29 NOTE — Op Note (Signed)
NAME:  [name], [name]                         ACCOUNT NO.:  [id number]   MEDICAL RECORD NO.:  [id number]                   PATIENT TYPE:  AMB   LOCATION:  ENDO                                 FACILITY:  [facility]   PHYSICIAN:  James L. Malon Kindle., M.D.          DATE OF BIRTH:  1956-03-10   DATE OF PROCEDURE:  10/30/2003  DATE OF DISCHARGE:                                 OPERATIVE REPORT   PROCEDURE PERFORMED:  Esophagogastroduodenoscopy.   MEDICATIONS:  Cetacaine spray, fentanyl 50 mcg, Versed 4 mg IV.   ENDOSCOPIST:  Llana Aliment. Randa Evens, M.D.   INDICATIONS FOR PROCEDURE:  Epigastric pain with early satiety and fullness.   DESCRIPTION OF PROCEDURE:  The procedure had been explained to the patient  and consent obtained.  With the patient in left lateral decubitus position,  the Olympus scope was inserted and advanced.  We advanced easily into the  stomach.  The stomach immediately upon entering had approximately 1000 mL of  liquid.  It was suctioned out in spite of overnight fast.  The gastric  outlet was identified and was widely patent and passed.  The duodenum was  completely normal.  Way down in the second portion there was bile present.  There were no obstructions to the second duodenum.  The mucosa appeared  normal.  The duodenal bulb was free of ulceration or inflammation.  Again,  the pyloric channel was normal.  The scope was brought back into the  stomach.  No ulcerations or abnormalities were seen on the forward and  retroflex view.  The fundus and cardia were seen well and were normal.  The  distal and proximal esophagus were endoscopically normal.   ASSESSMENT:  1. Epigastric pain probably due to gastroparesis, 789.06.  2. Gastroparesis probably due to her diabetes, 536.3.   PLAN:  Will give the patient Reglan 2 mg before meals and at bedtime.  Will  follow her back in the office in four to six weeks.                                               James L. Malon Kindle.,  M.D.    Waldron Session  D:  10/30/2003  T:  10/30/2003  Job:  [id number]   cc:   Britt Bottom C. Lowell Guitar, M.D.  [address]  [place]  [place] [address]  Fax: [phone]   Lilla Shook, M.D.  [address], [address]  [place]  [place] [address]  Fax: [phone]

## 2011-01-29 NOTE — Cardiovascular Report (Signed)
NAME:  [name], [name]                         ACCOUNT NO.:  [id number]   MEDICAL RECORD NO.:  [id number]                   PATIENT TYPE:  OIB   LOCATION:  2853                                 FACILITY:  [facility]   PHYSICIAN:  Lesleigh Noe, M.D.            DATE OF BIRTH:  08/08/1956   DATE OF PROCEDURE:  07/30/2003  DATE OF DISCHARGE:                              CARDIAC CATHETERIZATION   INDICATIONS:  A 55 year old African-American female with hypertensive heart  and kidney disease undergoing evaluation for a transplantation.  The  transplant surgeon in [facility] has requested a coronary angiography to  assess cardiac risk.   PROCEDURE PERFORMED:  1. Left heart catheterization.  2. Selective coronary angiogram.  3. Left ventriculography.  4. AngioSeal arteriotomy closure.   DESCRIPTION OF PROCEDURE:  After informed consent, a 6-French sheath was  placed in the right femoral artery using the modified Seldinger technique.  A 6-French A2 multipurpose catheter was used for hemodynamic recordings,  left ventriculography by hand injection, left coronary angiography, and  right coronary angiography.  The patient tolerated the procedure without  complications.  A sheathogram was performed in the right femoral artery.  AngioSeal Perclose arteriotomy closure was performed without complication.   RESULTS:   I. HEMODYNAMIC DATA:  A.  Left ventricular pressure 112/30.  B.  Aortic pressure 112/78.   II. LEFT VENTRICULOGRAPHY:  The left ventricle is mildly to moderately  dilated.  There is global hypokinesis with an estimated ejection fraction in  25-35% range.  No MR is noted.   III. CORONARY ANGIOGRAPHY:  A.  Left main coronary:  Free of any significant  obstruction.  B.  Left anterior descending coronary:  There is diffuse atherosclerosis  with 65-75% proximal LAD just beyond the origin of the first diagonal.  The  first diagonal contains 50-70% segmental disease.  The LAD reaches  the left  ventricular apex.  C.  Circumflex artery:  The circumflex artery is a large trifurcating vessel  that is free of any significant obstruction.  D.  Right coronary:  The right coronary artery is free of any significant  obstruction.  The vessel is dominant giving PDA and left ventricular branch.  Irregularities are noted in the mid vessel.   CONCLUSION:  1. Moderate proximal left anterior descending disease and diagonal disease.  2. Global left ventricular hypokinesis with ejection fraction 30%, probably     hypertensive in origin.  3. Successful AngioSeal arteriotomy closure.   PLAN:  Further evaluation per transplant service at [facility].  Intervention upon the LAD would be difficult and at this time is  not indicated because I believe the LAD and diagonal disease is only  borderline and should be followed and treated medically currently.  Lesleigh Noe, M.D.    HWS/MEDQ  D:  07/30/2003  T:  07/30/2003  Job:  [id number]   cc:   Lilla Shook, M.D.  [address], [address]  [place]  [place] [address]  Fax: [phone]   [facility]

## 2011-03-02 ENCOUNTER — Ambulatory Visit (HOSPITAL_COMMUNITY): Payer: Medicare Other

## 2011-03-02 ENCOUNTER — Ambulatory Visit (HOSPITAL_COMMUNITY)
Admission: RE | Admit: 2011-03-02 | Discharge: 2011-03-02 | Disposition: A | Discharge status: Home / Self Care | Payer: Medicare Other | Source: Ambulatory Visit | Attending: Vascular Surgery | Admitting: Vascular Surgery

## 2011-03-02 ENCOUNTER — Other Ambulatory Visit: Payer: Self-pay | Admitting: Vascular Surgery

## 2011-03-02 ENCOUNTER — Encounter (HOSPITAL_COMMUNITY): Payer: Medicare Other

## 2011-03-02 DIAGNOSIS — T82898A Other specified complication of vascular prosthetic devices, implants and grafts, initial encounter: Secondary | ICD-10-CM | POA: Insufficient documentation

## 2011-03-02 DIAGNOSIS — N186 End stage renal disease: Secondary | ICD-10-CM

## 2011-03-02 DIAGNOSIS — IMO0002 Reserved for concepts with insufficient information to code with codable children: Secondary | ICD-10-CM

## 2011-03-02 DIAGNOSIS — Z01812 Encounter for preprocedural laboratory examination: Secondary | ICD-10-CM | POA: Insufficient documentation

## 2011-03-02 DIAGNOSIS — N189 Chronic kidney disease, unspecified: Secondary | ICD-10-CM | POA: Insufficient documentation

## 2011-03-02 DIAGNOSIS — Y832 Surgical operation with anastomosis, bypass or graft as the cause of abnormal reaction of the patient, or of later complication, without mention of misadventure at the time of the procedure: Secondary | ICD-10-CM | POA: Insufficient documentation

## 2011-03-02 DIAGNOSIS — I120 Hypertensive chronic kidney disease with stage 5 chronic kidney disease or end stage renal disease: Secondary | ICD-10-CM

## 2011-03-02 DIAGNOSIS — Z01818 Encounter for other preprocedural examination: Secondary | ICD-10-CM | POA: Insufficient documentation

## 2011-03-02 LAB — SURGICAL PCR SCREEN
MRSA, PCR: NEGATIVE
Staphylococcus aureus: NEGATIVE

## 2011-03-02 LAB — POCT I-STAT 4, (NA,K, GLUC, HGB,HCT)
Glucose, Bld: 159 mg/dL — ABNORMAL HIGH (ref 70–99)
Hemoglobin: 13.9 g/dL (ref 12.0–15.0)

## 2011-03-02 LAB — PROTIME-INR
INR: 1.22 (ref 0.00–1.49)
Prothrombin Time: 15.7 seconds — ABNORMAL HIGH (ref 11.6–15.2)

## 2011-03-03 NOTE — Op Note (Addendum)
  NAMESOMALIA, SEGLER NO.:  [id number]  MEDICAL RECORD NO.:  [id number]  LOCATION:  XRAY                         FACILITY:  [facility]  PHYSICIAN:  [name]. Edilia Bo, M.D.DATE OF BIRTH:  July 31, 1956  DATE OF PROCEDURE:  03/02/2011 DATE OF DISCHARGE:  03/02/2011                              OPERATIVE REPORT   PREOPERATIVE DIAGNOSIS:  Chronic kidney disease.  POSTOPERATIVE DIAGNOSIS:  Chronic kidney disease.  PROCEDURE: 1. Removal of right IJ Diatek catheter. 2. Placement of a new right IJ 19-cm Diatek catheter.  SURGEON:  [name]. Edilia Bo, MD  ASSISTANT:  Nurse.  ANESTHESIA:  Local with sedation.  INDICATIONS:  This is a 55 year old woman who is at [facility] for transplant evaluation.  She had an echo which reportedly showed a clot at the end of her Diatek catheter.  We were asked to remove her catheter and place a new catheter.  TECHNIQUE:  The patient was taken to the operating room and was sedated by Anesthesia.  The neck and upper chest were prepped and draped in the usual sterile fashion.  The skin was anesthetized with 1% lidocaine.  In the exit site, the old catheter was anesthetized and the cuff dissected free bluntly and then traction used to remove the cuff and pressure was held for hemostasis at the IJ entrance site.  There was a small clot at the end of the catheter which was present once the catheter was removed. Next, after good hemostasis was obtained under ultrasound guidance and after the skin was anesthetized, the right IJ was cannulated and guidewire introduced into the superior vena cava under fluoroscopic control.  The tract over the wire was dilated and then a dilator and peel-away sheath were advanced over the wire and the wire and dilator were removed.  The catheter was passed through the peel-away sheath and positioned in the superior vena cava.  The exit site for the catheter was selected.  The skin anesthetized between the  two areas.  The catheter was then brought through the tunnel, cut to the appropriate length, and the distal ports were attached.  Both ports withdrew easily. We then flushed with heparinized saline and filled with concentrated heparin.  The catheter was secured at its exit site with a 3-0 nylon suture.  The IJ cannulation site was closed with a 4-0 subcuticular stitch.  Sterile dressing was applied.  The patient tolerated the procedure well and was transferred to the recovery room in stable condition.  All needle and sponge counts were correct.     [name]. Edilia Bo, M.D.   ______________________________ [name]. Edilia Bo, M.D.    CSD/MEDQ  D:  03/02/2011  T:  03/03/2011  Job:  [id number]  Electronically Signed by Waverly Ferrari M.D. on 03/15/2011 08:04:32 AM

## 2011-03-30 ENCOUNTER — Ambulatory Visit (HOSPITAL_COMMUNITY)
Admission: RE | Admit: 2011-03-30 | Discharge: 2011-03-30 | Disposition: A | Discharge status: Home / Self Care | Payer: Medicare Other | Source: Ambulatory Visit | Attending: Nephrology | Admitting: Nephrology

## 2011-03-30 DIAGNOSIS — I059 Rheumatic mitral valve disease, unspecified: Secondary | ICD-10-CM | POA: Insufficient documentation

## 2011-03-30 DIAGNOSIS — I079 Rheumatic tricuspid valve disease, unspecified: Secondary | ICD-10-CM | POA: Insufficient documentation

## 2011-03-30 DIAGNOSIS — I379 Nonrheumatic pulmonary valve disorder, unspecified: Secondary | ICD-10-CM | POA: Insufficient documentation

## 2011-05-03 ENCOUNTER — Other Ambulatory Visit: Payer: Self-pay | Admitting: Obstetrics and Gynecology

## 2011-05-03 DIAGNOSIS — Z1231 Encounter for screening mammogram for malignant neoplasm of breast: Secondary | ICD-10-CM

## 2011-05-14 ENCOUNTER — Encounter: Payer: Self-pay | Admitting: Internal Medicine

## 2011-05-18 ENCOUNTER — Ambulatory Visit: Payer: Medicare Other | Admitting: Internal Medicine

## 2011-05-21 ENCOUNTER — Encounter: Payer: Self-pay | Admitting: Vascular Surgery

## 2011-05-27 ENCOUNTER — Ambulatory Visit (INDEPENDENT_AMBULATORY_CARE_PROVIDER_SITE_OTHER): Payer: Medicare Other | Admitting: Internal Medicine

## 2011-05-27 ENCOUNTER — Encounter: Payer: Self-pay | Admitting: Internal Medicine

## 2011-05-27 VITALS — BP 110/64 | HR 86 | Ht 69.0 in | Wt 221.8 lb

## 2011-05-27 DIAGNOSIS — M329 Systemic lupus erythematosus, unspecified: Secondary | ICD-10-CM

## 2011-05-27 DIAGNOSIS — G4733 Obstructive sleep apnea (adult) (pediatric): Secondary | ICD-10-CM

## 2011-05-27 NOTE — Progress Notes (Signed)
  Subjective:    Patient ID: [name], female    DOB: 08-11-1956, 55 y.o.   MRN: [id number]  HPI 05/27/11- 41 yo F never smoker followed for OSA/ CSA, complicated by ESRD/ dialysis Last here November 12, 2010 [id number]- feels "used to it". Continues comfortable with it all night every night. Does take it off sometimes. Has too many antibodies for another try at renal transplant.  Has had flu shot. Denies any routine breathing concerns. Has already had flu shot.  Review of Systems Constitutional:   No-   weight loss, night sweats, fevers, chills, fatigue, lassitude. HEENT:   No-  headaches, difficulty swallowing, tooth/dental problems, sore throat,       No-  sneezing, itching, ear ache, nasal congestion, post nasal drip,  CV:  No-   chest pain, orthopnea, PND, swelling in lower extremities, anasarca, dizziness, palpitations Resp: No-   shortness of breath with exertion or at rest.              No-   productive cough,  No non-productive cough,  No-  coughing up of blood.              No-   change in color of mucus.  No- wheezing.   Skin: No-   rash or lesions. GI:  No-   heartburn, indigestion, abdominal pain, nausea, vomiting, diarrhea,                 change in bowel habits, loss of appetite GU: No-   dysuria, change in color of urine, no urgency or frequency.  No- flank pain. MS:  No-   joint pain or swelling.  No- decreased range of motion.  No- back pain. Neuro- grossly normal to observation, Or:  Psych:  No- change in mood or affect. No depression or anxiety.  No memory loss.      Objective:   Physical Exam  General- Alert, Oriented, Affect-appropriate, Distress- none acute Skin- rash-none, lesions- none, excoriation- none Lymphadenopathy- none Head- atraumatic            Eyes- Gross vision intact, PERRLA, conjunctivae clear secretions            Ears- Hearing, canals normal            Nose- Clear, no-Septal dev, mucus, polyps, erosion, perforation             Throat-  Mallampati II-III , mucosa clear , drainage- none, tonsils- atrophic Neck- flexible , trachea midline, no stridor , thyroid nl, carotid no bruit Chest - symmetrical excursion , unlabored           Heart/CV- RRR , no murmur , no gallop  , no rub, nl s1 s2                           - JVD- none , edema- none, stasis changes- none, varices- none           Lung- clear to P&A, wheeze- none, cough- none , dullness-none, rub- none           Chest wall- sternotomy  scar Abd- tender-no, distended-no, bowel sounds-present, HSM- no Br/ Gen/ Rectal- Not done, not indicated Extrem- cyanosis- none, clubbing, none, atrophy- none, strength- nl; access scars Neuro- grossly intact to observation        Assessment & Plan:

## 2011-05-27 NOTE — Assessment & Plan Note (Signed)
Good compliance and control. She is comfortable continuing at 12/ Advanced.

## 2011-05-27 NOTE — Patient Instructions (Signed)
Continue CPAP at 12. Our goal is "all night, every night"    Please call as needed

## 2011-05-31 ENCOUNTER — Ambulatory Visit
Admission: RE | Admit: 2011-05-31 | Discharge: 2011-05-31 | Disposition: A | Discharge status: Home / Self Care | Payer: Medicare Other | Source: Ambulatory Visit | Attending: Obstetrics and Gynecology | Admitting: Obstetrics and Gynecology

## 2011-05-31 DIAGNOSIS — Z1231 Encounter for screening mammogram for malignant neoplasm of breast: Secondary | ICD-10-CM

## 2011-06-01 DIAGNOSIS — M329 Systemic lupus erythematosus, unspecified: Secondary | ICD-10-CM | POA: Insufficient documentation

## 2011-06-03 ENCOUNTER — Other Ambulatory Visit (HOSPITAL_COMMUNITY): Payer: Self-pay | Admitting: Nephrology

## 2011-06-03 DIAGNOSIS — N186 End stage renal disease: Secondary | ICD-10-CM

## 2011-06-03 LAB — CBC
HCT: 33.8 — ABNORMAL LOW
HCT: 35.5 — ABNORMAL LOW
Hemoglobin: 12
Hemoglobin: 12.9
Hemoglobin: 13.1
MCHC: 33.5
MCHC: 33.6
MCHC: 33.8
MCHC: 33.9
MCHC: 34.2
MCHC: 34.4
MCV: 83.8
MCV: 84.3
MCV: 84.4
Platelets: 214
Platelets: 229
Platelets: 230
Platelets: 254
Platelets: 283
RBC: 4.48
RBC: 4.59
RDW: 15.4
RDW: 15.6 — ABNORMAL HIGH
RDW: 15.7 — ABNORMAL HIGH
RDW: 15.7 — ABNORMAL HIGH
RDW: 15.7 — ABNORMAL HIGH

## 2011-06-03 LAB — WOUND CULTURE

## 2011-06-03 LAB — TYPE AND SCREEN
ABO/RH(D): B POS
Antibody Screen: POSITIVE
DAT, IgG: POSITIVE
Donor AG Type: NEGATIVE
Donor AG Type: NEGATIVE
PT AG Type: NEGATIVE

## 2011-06-03 LAB — PROTIME-INR
INR: 1.2
Prothrombin Time: 15.5 — ABNORMAL HIGH
Prothrombin Time: 16.6 — ABNORMAL HIGH

## 2011-06-03 LAB — BASIC METABOLIC PANEL
BUN: 25 — ABNORMAL HIGH
BUN: 47 — ABNORMAL HIGH
BUN: 66 — ABNORMAL HIGH
CO2: 23
CO2: 25
CO2: 26
CO2: 27
Calcium: 8.3 — ABNORMAL LOW
Chloride: 91 — ABNORMAL LOW
Chloride: 92 — ABNORMAL LOW
Chloride: 93 — ABNORMAL LOW
Creatinine, Ser: 11.05 — ABNORMAL HIGH
Creatinine, Ser: 7.88 — ABNORMAL HIGH
GFR calc Af Amer: 5 — ABNORMAL LOW
GFR calc Af Amer: 6 — ABNORMAL LOW
GFR calc non Af Amer: 5 — ABNORMAL LOW
Glucose, Bld: 109 — ABNORMAL HIGH
Glucose, Bld: 95
Glucose, Bld: 97
Potassium: 4.6
Sodium: 130 — ABNORMAL LOW
Sodium: 131 — ABNORMAL LOW

## 2011-06-03 LAB — RENAL FUNCTION PANEL
Albumin: 2.1 — ABNORMAL LOW
Albumin: 2.2 — ABNORMAL LOW
BUN: 30 — ABNORMAL HIGH
BUN: 39 — ABNORMAL HIGH
CO2: 24
Calcium: 7.9 — ABNORMAL LOW
Calcium: 8.2 — ABNORMAL LOW
Creatinine, Ser: 9.67 — ABNORMAL HIGH
GFR calc Af Amer: 5 — ABNORMAL LOW
GFR calc non Af Amer: 4 — ABNORMAL LOW
Glucose, Bld: 85
Phosphorus: 4.3
Potassium: 4.5

## 2011-06-03 LAB — BLOOD GAS, ARTERIAL
Bicarbonate: 26.1 — ABNORMAL HIGH
Drawn by: 181601
FIO2: 0.21
O2 Saturation: 94.2
Patient temperature: 98.6
pH, Arterial: 7.455 — ABNORMAL HIGH

## 2011-06-03 LAB — COMPREHENSIVE METABOLIC PANEL
ALT: 14
AST: 14
Albumin: 2.2 — ABNORMAL LOW
Alkaline Phosphatase: 232 — ABNORMAL HIGH
Calcium: 9
GFR calc Af Amer: 6 — ABNORMAL LOW
Potassium: 4.2
Sodium: 135
Total Protein: 7.9

## 2011-06-03 LAB — CULTURE, BLOOD (ROUTINE X 2): Culture: NO GROWTH

## 2011-06-03 LAB — ANAEROBIC CULTURE: Gram Stain: NONE SEEN

## 2011-06-04 LAB — COMPREHENSIVE METABOLIC PANEL
ALT: 19
AST: 44 — ABNORMAL HIGH
BUN: 31 — ABNORMAL HIGH
CO2: 26
Calcium: 7.8 — ABNORMAL LOW
Calcium: 8.2 — ABNORMAL LOW
Creatinine, Ser: 7.18 — ABNORMAL HIGH
GFR calc Af Amer: 7 — ABNORMAL LOW
GFR calc non Af Amer: 6 — ABNORMAL LOW
Glucose, Bld: 101 — ABNORMAL HIGH
Glucose, Bld: 122 — ABNORMAL HIGH
Sodium: 133 — ABNORMAL LOW
Total Protein: 8.3

## 2011-06-04 LAB — RENAL FUNCTION PANEL
Albumin: 2.2 — ABNORMAL LOW
Albumin: 2.4 — ABNORMAL LOW
BUN: 29 — ABNORMAL HIGH
BUN: 50 — ABNORMAL HIGH
CO2: 25
CO2: 27
Calcium: 7.9 — ABNORMAL LOW
Calcium: 8.2 — ABNORMAL LOW
Chloride: 100
Chloride: 91 — ABNORMAL LOW
Chloride: 94 — ABNORMAL LOW
Creatinine, Ser: 6.53 — ABNORMAL HIGH
Creatinine, Ser: 7.53 — ABNORMAL HIGH
Creatinine, Ser: 8.54 — ABNORMAL HIGH
Creatinine, Ser: 9.14 — ABNORMAL HIGH
GFR calc Af Amer: 5 — ABNORMAL LOW
GFR calc Af Amer: 7 — ABNORMAL LOW
GFR calc Af Amer: 8 — ABNORMAL LOW
GFR calc non Af Amer: 5 — ABNORMAL LOW
GFR calc non Af Amer: 6 — ABNORMAL LOW
GFR calc non Af Amer: 7 — ABNORMAL LOW
Glucose, Bld: 117 — ABNORMAL HIGH
Glucose, Bld: 121 — ABNORMAL HIGH
Phosphorus: 2.8
Phosphorus: 2.9
Phosphorus: 4.5
Potassium: 3.9
Potassium: 4.3
Potassium: 4.8
Sodium: 132 — ABNORMAL LOW
Sodium: 133 — ABNORMAL LOW
Sodium: 134 — ABNORMAL LOW

## 2011-06-04 LAB — CBC
HCT: 31.5 — ABNORMAL LOW
HCT: 32.6 — ABNORMAL LOW
HCT: 34.1 — ABNORMAL LOW
HCT: 34.5 — ABNORMAL LOW
Hemoglobin: 11 — ABNORMAL LOW
Hemoglobin: 11.3 — ABNORMAL LOW
Hemoglobin: 11.5 — ABNORMAL LOW
Hemoglobin: 11.6 — ABNORMAL LOW
MCHC: 33.1
MCHC: 33.2
MCHC: 33.6
MCV: 84.2
MCV: 84.3
MCV: 84.5
MCV: 84.8
MCV: 85
MCV: 85.2
Platelets: 241
Platelets: 251
Platelets: 262
Platelets: 293
RBC: 3.84 — ABNORMAL LOW
RBC: 3.88
RBC: 4.04
RBC: 4.34
RDW: 15.6 — ABNORMAL HIGH
RDW: 15.9 — ABNORMAL HIGH
RDW: 16 — ABNORMAL HIGH
RDW: 16.3 — ABNORMAL HIGH
RDW: 17.4 — ABNORMAL HIGH
WBC: 10.6 — ABNORMAL HIGH
WBC: 13.4 — ABNORMAL HIGH
WBC: 14.6 — ABNORMAL HIGH
WBC: 22.3 — ABNORMAL HIGH
WBC: 7.5

## 2011-06-04 LAB — IRON AND TIBC
Iron: 38 — ABNORMAL LOW
Saturation Ratios: 31
TIBC: 124 — ABNORMAL LOW

## 2011-06-04 LAB — DIFFERENTIAL
Basophils Absolute: 0
Basophils Absolute: 0.1
Basophils Relative: 1
Eosinophils Absolute: 0.1
Eosinophils Absolute: 0.1
Eosinophils Relative: 0
Eosinophils Relative: 1
Eosinophils Relative: 1
Lymphocytes Relative: 12
Lymphocytes Relative: 18
Lymphocytes Relative: 21
Lymphs Abs: 1.3
Lymphs Abs: 1.3
Lymphs Abs: 2.7
Monocytes Absolute: 3 — ABNORMAL HIGH
Monocytes Relative: 15 — ABNORMAL HIGH
Neutro Abs: 5.3
Neutrophils Relative %: 70
Neutrophils Relative %: 70
Neutrophils Relative %: 83 — ABNORMAL HIGH

## 2011-06-04 LAB — CULTURE, BLOOD (ROUTINE X 2)

## 2011-06-04 LAB — BASIC METABOLIC PANEL
BUN: 17
Calcium: 7.9 — ABNORMAL LOW
Chloride: 94 — ABNORMAL LOW
Creatinine, Ser: 5.86 — ABNORMAL HIGH
GFR calc Af Amer: 9 — ABNORMAL LOW
GFR calc non Af Amer: 8 — ABNORMAL LOW

## 2011-06-04 LAB — PHOSPHORUS: Phosphorus: 4.5

## 2011-06-24 ENCOUNTER — Ambulatory Visit (HOSPITAL_COMMUNITY)
Admission: RE | Admit: 2011-06-24 | Discharge: 2011-06-24 | Disposition: A | Discharge status: Home / Self Care | Payer: Medicare Other | Source: Ambulatory Visit | Attending: Nephrology | Admitting: Nephrology

## 2011-06-24 DIAGNOSIS — T82898A Other specified complication of vascular prosthetic devices, implants and grafts, initial encounter: Secondary | ICD-10-CM | POA: Insufficient documentation

## 2011-06-24 DIAGNOSIS — N186 End stage renal disease: Secondary | ICD-10-CM | POA: Insufficient documentation

## 2011-06-24 DIAGNOSIS — I120 Hypertensive chronic kidney disease with stage 5 chronic kidney disease or end stage renal disease: Secondary | ICD-10-CM | POA: Insufficient documentation

## 2011-06-24 DIAGNOSIS — E119 Type 2 diabetes mellitus without complications: Secondary | ICD-10-CM | POA: Insufficient documentation

## 2011-06-24 DIAGNOSIS — Y832 Surgical operation with anastomosis, bypass or graft as the cause of abnormal reaction of the patient, or of later complication, without mention of misadventure at the time of the procedure: Secondary | ICD-10-CM | POA: Insufficient documentation

## 2011-06-24 DIAGNOSIS — E785 Hyperlipidemia, unspecified: Secondary | ICD-10-CM | POA: Insufficient documentation

## 2011-06-24 DIAGNOSIS — N2581 Secondary hyperparathyroidism of renal origin: Secondary | ICD-10-CM | POA: Insufficient documentation

## 2011-06-24 DIAGNOSIS — I739 Peripheral vascular disease, unspecified: Secondary | ICD-10-CM | POA: Insufficient documentation

## 2011-06-24 DIAGNOSIS — I428 Other cardiomyopathies: Secondary | ICD-10-CM | POA: Insufficient documentation

## 2011-06-24 DIAGNOSIS — D649 Anemia, unspecified: Secondary | ICD-10-CM | POA: Insufficient documentation

## 2011-06-24 MED ORDER — IOHEXOL 300 MG/ML  SOLN: 48.0000 mL | Freq: Once | INTRAMUSCULAR | Status: AC | PRN

## 2011-07-07 ENCOUNTER — Ambulatory Visit: Payer: Medicare Other | Admitting: Vascular Surgery

## 2011-07-13 ENCOUNTER — Encounter: Payer: Self-pay | Admitting: Vascular Surgery

## 2011-07-13 IMAGING — CR DG CHEST 1V PORT
1 series · 1 of 1 positions shown · non-contrast
Comparison: 08/11/2010

CLINICAL DATA: Diatek catheter placement.  Diabetes.  End-stage
renal disease.

PORTABLE CHEST - 1 VIEW

[view not recorded]
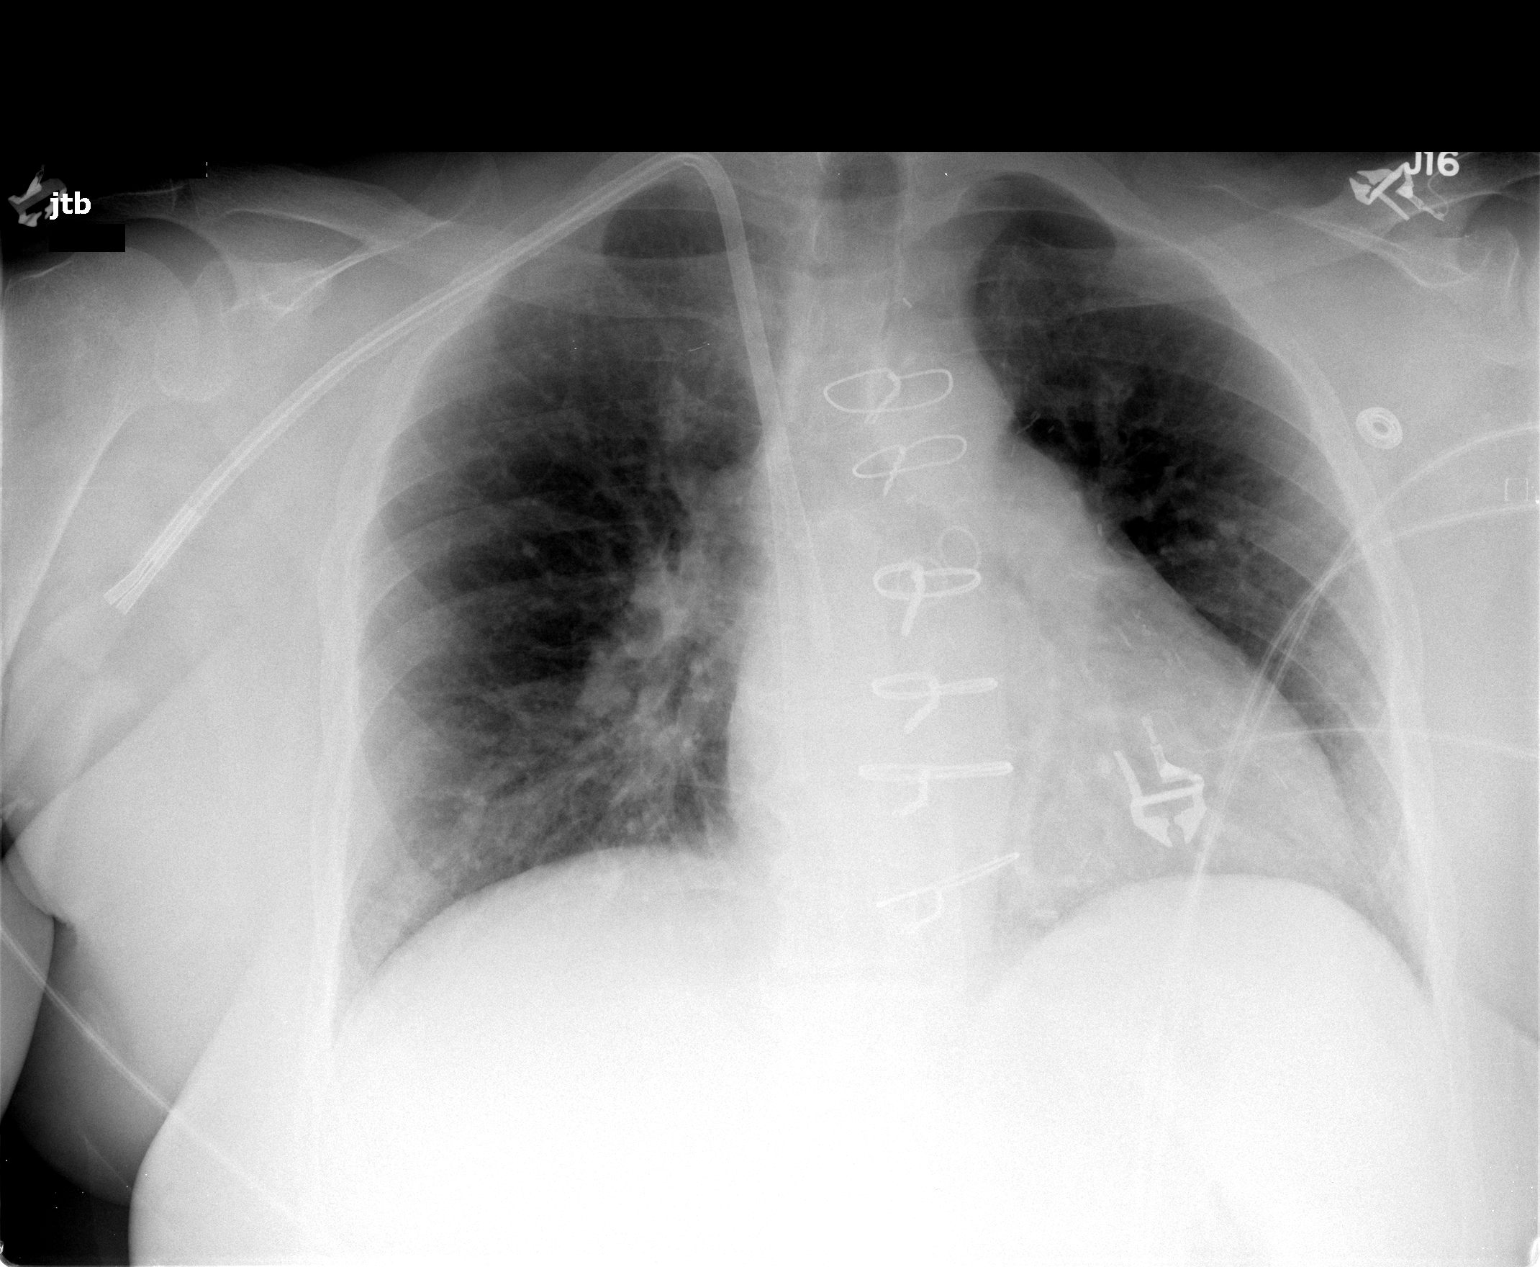

[1 of 1 positions shown; findings below may reference images not displayed]

FINDINGS: Diatek catheter noted with distal tip in the right atrium
and proximal tip at the cavoatrial junction.  No pneumothorax
identified.

Prior CABG noted.  Borderline cardiomegaly is present.  There is a
suggestion of pulmonary venous hypertension, but without overt
edema.
IMPRESSION: 1.  Borderline cardiomegaly with a suggestion of pulmonary venous
hypertension, but without edema.
2.  Diatek catheter distal tip:  Right atrium.  No pneumothorax or
complicating feature identified.

## 2011-07-14 ENCOUNTER — Ambulatory Visit (INDEPENDENT_AMBULATORY_CARE_PROVIDER_SITE_OTHER): Payer: Medicare Other | Admitting: Vascular Surgery

## 2011-07-14 ENCOUNTER — Encounter: Payer: Self-pay | Admitting: Vascular Surgery

## 2011-07-14 ENCOUNTER — Ambulatory Visit (INDEPENDENT_AMBULATORY_CARE_PROVIDER_SITE_OTHER): Payer: Medicare Other | Admitting: *Deleted

## 2011-07-14 VITALS — BP 118/74 | HR 102 | Ht 69.0 in | Wt 218.0 lb

## 2011-07-14 DIAGNOSIS — N186 End stage renal disease: Secondary | ICD-10-CM

## 2011-07-14 DIAGNOSIS — I739 Peripheral vascular disease, unspecified: Secondary | ICD-10-CM | POA: Insufficient documentation

## 2011-07-14 NOTE — Progress Notes (Signed)
[facility] [place]  Patient name: MADELL HEINO MRN: [id number] DOB: [birth year] Sex: female  CC: followup of peripheral vascular disease  HPI: [name] is a 55 y.o. female who is had a previous right below the knee amputation. She has known infrainguinal arterial occlusive disease on the left and comes in for a 6 month followup visit. Since I saw her last she's had no significant claudication the left leg. She has been ambulatory with her prosthesis. She's had no rest pain, left and no history of nonhealing ulcers in the left leg.  She does have a history of chronic kidney disease and has a catheter for dialysis. She has a fistula in the right arm which has not matured adequately. This was done in the [facility] and she's followed by the physicians there. She's had no recent uremic symptoms. She denies nausea, vomiting, fatigue, or anorexia.  Past Medical History  Diagnosis Date  . Diabetes mellitus, type 2   . Kidney disease     ESRD secondary to DM, failed transplant 2006. Dialysis  . Secondary hyperparathyroidism   . OSA (obstructive sleep apnea)     NPSG 11.23.11-AHI 64.9/hr-central and obstructive apnea  . Ischemic cardiomyopathy     EF 25-30% 2011  . CAD (coronary artery disease)   . Pulmonary hypertension     Est PAsyst 60-Dr Mendel Ryder, off coumadin, CT neg for PE  . Hyperlipidemia   . Hypertension   . Anemia   . 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 Velvet Bathe  . Thyroid disease   . Pulmonary edema   . Peripheral vascular disease     Family History  Problem Relation Age of Onset  . Heart disease    . Edema Father     fluid overload  . Cerebral aneurysm Mother   . Stroke Brother   . Heart disease Brother     SOCIAL HISTORY: History  Substance Use Topics  . Smoking status: Never Smoker   . Smokeless tobacco: Not on file  . Alcohol Use: No    Allergies  Allergen Reactions  . Ezetimibe-Simvastatin     REACTION: face edema;  itching  . Klonopin (Clonazepam)   . Zoloft     Current Outpatient Prescriptions  Medication Sig Dispense Refill  . acetaminophen (TYLENOL) 325 MG tablet Take 650 mg by mouth every 6 (six) hours as needed.        [name] aspirin 325 MG tablet Take 325 mg by mouth daily.        [name] atorvastatin (LIPITOR) 10 MG tablet Take 10 mg by mouth daily.        . B Complex-C-Folic Acid (DIALYVITE 800 PO) Take 1 tablet by mouth daily.        . cinacalcet (SENSIPAR) 60 MG tablet Take 60 mg by mouth daily.        . hydrOXYzine (ATARAX) 25 MG tablet Take 25 mg by mouth 3 (three) times daily as needed.        . insulin aspart (NOVOLOG) 100 UNIT/ML injection Inject into the skin. Sliding scale      . insulin glargine (LANTUS) 100 UNIT/ML injection Inject into the skin at bedtime.        . midodrine (PROAMATINE) 5 MG tablet Take 5 mg by mouth 3 (three) times daily.        . pregabalin (LYRICA) 50 MG capsule Take 50 mg by mouth at bedtime.        [name]  sevelamer (RENVELA) 800 MG tablet Take by mouth. 3 tabs by mouth three times a day       . fluconazole (DIFLUCAN) 200 MG tablet       . Sennosides (SENOKOT PO) Take 2 tablets by mouth at bedtime.          REVIEW OF SYSTEMS: Arly.Keller ] denotes positive finding; [  ] denotes negative finding CARDIOVASCULAR:  [ ]  chest pain   [ ]  chest pressure   [ ]  palpitations   [ ]  orthopnea   [ ]  dyspnea on exertion   [ ]  claudication   [ ]  rest pain   [ ]  DVT   [ ]  phlebitis PULMONARY:   [ ]  productive cough   [ ]  asthma   [ ]  wheezing NEUROLOGIC:   [ ]  weakness  [ ]  paresthesias  [ ]  aphasia  [ ]  amaurosis  [ ]  dizziness HEMATOLOGIC:   [ ]  bleeding problems   [ ]  clotting disorders MUSCULOSKELETAL:  [ ]  joint pain   [ ]  joint swelling GASTROINTESTINAL: [ ]   blood in stool  [ ]   hematemesis GENITOURINARY:  [ ]   dysuria  [ ]   hematuria PSYCHIATRIC:  [ ]  history of major depression INTEGUMENTARY:  [ ]  rashes  [ ]  ulcers CONSTITUTIONAL:  [ ]  fever   [ ]  chills  PHYSICAL EXAM: Filed  Vitals:   07/14/11 1559  BP: 118/74  Pulse: 102  Height: 5\' 9"  (1.753 m)  Weight: 218 lb (98.884 kg)  SpO2: 96%   Body mass index is 32.19 kg/(m^2). GENERAL: The patient is a well-nourished female, in no acute distress. The vital signs are documented above. CARDIOVASCULAR: There is a regular rate and rhythm without significant murmur appreciated. I do not detect any carotid bruits. She has a palpable femoral pulses. I cannot palpate pedal pulses in the left foot. However, the left foot is warm and well-perfused. She has no significant lower Cherie swelling. PULMONARY: There is good air exchange bilaterally without wheezing or rales. ABDOMEN: Soft and non-tender with normal pitched bowel sounds.  MUSCULOSKELETAL: There are no major deformities or cyanosis. NEUROLOGIC: No focal weakness or paresthesias are detected. SKIN: There are no wounds on the left foot. PSYCHIATRIC: The patient has a normal affect.  DATA:  I have independently interpreted her arterial Doppler study today which shows monophasic Doppler signals in the left foot in the dorsalis pedis and peroneal positions. ABI on the left is 89%. I have compared this to her previous study back in April of this year when her ABI was 78%.  MEDICAL ISSUES: Her infrainguinal arterial occlusive disease on the left has remained stable. I think it is safe to stretch her followup up to one year. I'll see her back in one year. I've ordered followup ABIs for that time. She knows to call sooner she has problems. I've encouraged her to stay as active as possible.  DICKSON,CHRISTOPHER S [facility] [place] Office: [phone]

## 2011-09-30 ENCOUNTER — Other Ambulatory Visit (INDEPENDENT_AMBULATORY_CARE_PROVIDER_SITE_OTHER): Payer: Self-pay | Admitting: General Surgery

## 2011-09-30 ENCOUNTER — Encounter (INDEPENDENT_AMBULATORY_CARE_PROVIDER_SITE_OTHER): Payer: Self-pay | Admitting: General Surgery

## 2011-09-30 ENCOUNTER — Ambulatory Visit (INDEPENDENT_AMBULATORY_CARE_PROVIDER_SITE_OTHER): Payer: Medicare Other | Admitting: General Surgery

## 2011-09-30 VITALS — BP 128/77 | HR 73 | Temp 96.9°F | Resp 14 | Ht 69.0 in | Wt 226.0 lb

## 2011-09-30 DIAGNOSIS — L723 Sebaceous cyst: Secondary | ICD-10-CM | POA: Insufficient documentation

## 2011-09-30 DIAGNOSIS — L089 Local infection of the skin and subcutaneous tissue, unspecified: Secondary | ICD-10-CM

## 2011-09-30 NOTE — Progress Notes (Signed)
Chief Complaint  Patient presents with  . Mass    cyst back of neck    HISTORY: The patient presents with a one-month history of a knot at the base of her right neck. This has slowly gotten larger. It started to hurt a few weeks ago and has hurt slightly more every day. She has had boils on other parts of her body but they have all drained spontaneously.  She denies fevers and chills. She has not been on antibiotics for multiple months. She has dialysis Monday Wednesday and Friday.  Past Medical History  Diagnosis Date  . Diabetes mellitus, type 2   . Kidney disease     ESRD secondary to DM, failed transplant 2006. Dialysis  . Secondary hyperparathyroidism   . OSA (obstructive sleep apnea)     NPSG 11.23.11-AHI 64.9/hr-central and obstructive apnea  . Ischemic cardiomyopathy     EF 25-30% 2011  . CAD (coronary artery disease)   . Pulmonary hypertension     Est PAsyst 60-Dr Mendel Ryder, off coumadin, CT neg for PE  . Hyperlipidemia   . Hypertension   . Anemia   . 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 Velvet Bathe  . Thyroid disease   . Pulmonary edema   . Peripheral vascular disease     Past Surgical History  Procedure Date  . Coronary artery bypass graft 2011    3V  . Insertion of dialysis catheter   . Above knee leg amputation     infection/gangrene 10/11/07  . Nasal mucosal biopsy   . Ovarian cyst surgery   . Removal of failed cadaveric renal transplant 2006-wound infection   . Laser retinal surgery 2011  . Kidney transplant     rejection    Current Outpatient Prescriptions  Medication Sig Dispense Refill  . acetaminophen (TYLENOL) 325 MG tablet Take 650 mg by mouth every 6 (six) hours as needed.        [name] aspirin 325 MG tablet Take 325 mg by mouth daily.        [name] atorvastatin (LIPITOR) 10 MG tablet Take 10 mg by mouth daily.        . B Complex-C-Folic Acid (DIALYVITE 800 PO) Take 1 tablet by mouth daily.        . cinacalcet (SENSIPAR) 60 MG tablet Take 60 mg by mouth  daily.        . fluconazole (DIFLUCAN) 200 MG tablet       . hydrOXYzine (ATARAX) 25 MG tablet Take 25 mg by mouth 3 (three) times daily as needed.        . insulin aspart (NOVOLOG) 100 UNIT/ML injection Inject into the skin. Sliding scale      . insulin glargine (LANTUS) 100 UNIT/ML injection Inject into the skin at bedtime.        . midodrine (PROAMATINE) 5 MG tablet Take 5 mg by mouth 3 (three) times daily.        . pregabalin (LYRICA) 50 MG capsule Take 50 mg by mouth at bedtime.        . Sennosides (SENOKOT PO) Take 2 tablets by mouth at bedtime.        . sevelamer (RENVELA) 800 MG tablet Take by mouth. 3 tabs by mouth three times a day          Allergies  Allergen Reactions  . Ezetimibe-Simvastatin     REACTION: face edema; itching  . Klonopin (Clonazepam)   . Zoloft      Family  History  Problem Relation Age of Onset  . Heart disease    . Edema Father     fluid overload  . Cerebral aneurysm Mother   . Stroke Brother   . Heart disease Brother      History   Social History  . Marital Status: Single    Spouse Name: N/A    Number of Children: N/A  . Years of Education: N/A   Social History Main Topics  . Smoking status: Never Smoker   . Smokeless tobacco: None  . Alcohol Use: No  . Drug Use: No  . Sexually Active:    REVIEW OF SYSTEMS - PERTINENT POSITIVES ONLY: 12 point review of systems negative other than HPI and PMH except for constipation.  EXAM: Filed Vitals:   09/30/11 1450  BP: 128/77  Pulse: 73  Temp: 96.9 F (36.1 C)  Resp: 14    Gen:  No acute distress.  Well nourished and well groomed.   Neurological: Alert and oriented to person, place, and time. Coordination normal.  Head: Normocephalic and atraumatic.  Eyes: Conjunctivae are normal. Pupils are equal, round, and reactive to light. No scleral icterus.  Neck: Normal range of motion. Neck supple. No tracheal deviation or thyromegaly present.    Abscess on posterior lower R neck.  Cleaned  with CHG, infiltrated xylocaine, and incised with #11 blade.  Copious purulent drainage seen.  Cultured.   Back:  Buffalo hump consistent with steroid use.   Cardiovascular: Normal rate, regular rhythm. Respiratory: Effort normal.  No respiratory distress. No chest wall tenderness.   Musculoskeletal: Normal range of motion. Extremities are nontender.  Skin: Skin is warm and dry. No rash noted. No diaphoresis. No erythema. No pallor. No clubbing, cyanosis, or edema.   Psychiatric: Normal mood and affect. Behavior is normal. Judgment and thought content normal.    ASSESSMENT AND PLAN: Infected sebaceous cyst of Right posterior neck. I&D performed at bedside.  Wound cultured.   No cellulitis, so no antibiotics given. Follow up in 2-4 weeks.        Maudry Diego MD Surgical Oncology, General and Endocrine Surgery [facility]      Visit Diagnoses: 1. Infected sebaceous cyst of Right posterior neck.     Primary Care Physician: Katy Apo, MD, MD

## 2011-09-30 NOTE — Patient Instructions (Signed)
D/C dressing and packing in 48 hours, then shower with wound open.  Dry dressing to wound as needed at that point.    Follow up in 2-3 weeks.

## 2011-09-30 NOTE — Assessment & Plan Note (Signed)
I&D performed at bedside.  Wound cultured.   No cellulitis, so no antibiotics given. Follow up in 2-4 weeks.

## 2011-10-03 LAB — WOUND CULTURE

## 2011-10-29 ENCOUNTER — Encounter (INDEPENDENT_AMBULATORY_CARE_PROVIDER_SITE_OTHER): Payer: Medicare Other | Admitting: General Surgery

## 2011-11-01 ENCOUNTER — Encounter (INDEPENDENT_AMBULATORY_CARE_PROVIDER_SITE_OTHER): Payer: Self-pay | Admitting: General Surgery

## 2011-11-01 ENCOUNTER — Ambulatory Visit (INDEPENDENT_AMBULATORY_CARE_PROVIDER_SITE_OTHER): Payer: Medicare Other | Admitting: General Surgery

## 2011-11-01 VITALS — BP 116/62 | HR 70 | Temp 98.1°F | Resp 18 | Ht 69.0 in | Wt 218.2 lb

## 2011-11-01 DIAGNOSIS — L089 Local infection of the skin and subcutaneous tissue, unspecified: Secondary | ICD-10-CM

## 2011-11-01 DIAGNOSIS — L723 Sebaceous cyst: Secondary | ICD-10-CM

## 2011-11-01 NOTE — Progress Notes (Signed)
Subjective:     Patient ID: [name], female   DOB: 29-Sep-1955, 56 y.o.   MRN: [id number]  HPI Pt is doing well s/p I&D of infected right posterior neck cyst.  She has had no further pain or drainage.    Review of Systems Otherwise negative    Objective:   Physical Exam Incision well healed.   8 mm lesion consistent with sebaceous cyst still present.   Non tender, no erythema, no drainage.      Assessment/Plan:     Infected sebaceous cyst of Right posterior neck. Healed I&D site. Continues to have a sebaceous cyst.  Advised pt that it may get infected again. Offered resection, but pt defers given risk of wound issues between lupus, diabetes, and renal failure.    I reviewed signs of infection and advised to call early if these occur.

## 2011-11-01 NOTE — Patient Instructions (Signed)
Call early if you develop redness or pain or drainage from wound.

## 2011-11-01 NOTE — Assessment & Plan Note (Signed)
Healed I&D site. Continues to have a sebaceous cyst.  Advised pt that it may get infected again. Offered resection, but pt defers given risk of wound issues between lupus, diabetes, and renal failure.    I reviewed signs of infection and advised to call early if these occur.

## 2011-11-22 ENCOUNTER — Other Ambulatory Visit: Payer: Self-pay | Admitting: *Deleted

## 2011-11-23 ENCOUNTER — Encounter (HOSPITAL_COMMUNITY)
Admission: RE | Admit: 2011-11-23 | Discharge: 2011-11-23 | Disposition: A | Discharge status: Home / Self Care | Payer: Medicare Other | Source: Ambulatory Visit | Attending: Vascular Surgery | Admitting: Vascular Surgery

## 2011-11-23 DIAGNOSIS — N186 End stage renal disease: Secondary | ICD-10-CM | POA: Insufficient documentation

## 2011-11-23 DIAGNOSIS — Z452 Encounter for adjustment and management of vascular access device: Secondary | ICD-10-CM | POA: Insufficient documentation

## 2011-11-23 DIAGNOSIS — I120 Hypertensive chronic kidney disease with stage 5 chronic kidney disease or end stage renal disease: Secondary | ICD-10-CM | POA: Insufficient documentation

## 2011-11-23 NOTE — Progress Notes (Signed)
[facility] SHORT STAY H&P  CC:  Removal of catheter   HPI:  56 y/o female who is here for right diatek catheter removal.  She states that she has a right upper arm fistula placed in October at [facility].  They are using this successfully without difficulty.  Dr. Edilia Bo placed the right IJ diatek catheter in June 2012.  She is not on Coumadin.  Past Medical History  Diagnosis Date  . Diabetes mellitus, type 2   . Kidney disease     ESRD secondary to DM, failed transplant 2006. Dialysis  . Secondary hyperparathyroidism   . OSA (obstructive sleep apnea)     NPSG 11.23.11-AHI 64.9/hr-central and obstructive apnea  . Ischemic cardiomyopathy     EF 25-30% 2011  . CAD (coronary artery disease)   . Pulmonary hypertension     Est PAsyst 60-Dr Mendel Ryder, off coumadin, CT neg for PE  . Hyperlipidemia   . Hypertension   . Anemia   . Thyroid disease   . Pulmonary edema   . Peripheral vascular disease   . Sebaceous cyst   . 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 Velvet Bathe    FH:  Non-Contributory  History   Social History  . Marital Status: Single    Spouse Name: N/A    Number of Children: N/A  . Years of Education: N/A   Occupational History  . Not on file.   Social History Main Topics  . Smoking status: Never Smoker   . Smokeless tobacco: Never Used  . Alcohol Use: No  . Drug Use: No  . Sexually Active: Not on file   Other Topics Concern  . Not on file   Social History Narrative  . No narrative on file    Allergies  Allergen Reactions  . Ezetimibe-Simvastatin     REACTION: face edema; itching  . Klonopin (Clonazepam)   . Zoloft     Current Outpatient Prescriptions  Medication Sig Dispense Refill  . acetaminophen (TYLENOL) 325 MG tablet Take 650 mg by mouth every 6 (six) hours as needed.        [name] aspirin 325 MG tablet Take 325 mg by mouth daily.        [name] atorvastatin (LIPITOR) 10 MG tablet Take 10 mg by mouth daily.        . B  Complex-C-Folic Acid (DIALYVITE 800 PO) Take 1 tablet by mouth daily.        . cinacalcet (SENSIPAR) 60 MG tablet Take 60 mg by mouth daily.        . hydrOXYzine (ATARAX) 25 MG tablet Take 25 mg by mouth 3 (three) times daily as needed.        . insulin aspart (NOVOLOG) 100 UNIT/ML injection Inject into the skin. Sliding scale      . insulin glargine (LANTUS) 100 UNIT/ML injection Inject into the skin at bedtime.        . midodrine (PROAMATINE) 5 MG tablet Take 5 mg by mouth 3 (three) times daily.        . pregabalin (LYRICA) 50 MG capsule Take 50 mg by mouth at bedtime.        . Sennosides (SENOKOT PO) Take 2 tablets by mouth at bedtime.        . sevelamer (RENVELA) 800 MG tablet Take by mouth. 3 tabs by mouth three times a day         ROS:  See HPI  PHYSICAL EXAM  Filed  Vitals:   11/23/11 1030  BP: 115/72  Pulse: 96  Temp: 98.8 F (37.1 C)  Resp: 20    Gen: NAD HEENT:  normocephalic Neck:  Right IJ diatek in place Heart:  RRR Lungs:  CTAB Abdomen:  Soft NT/ND Extremities:  Right BKA Skin: no obvious rashes Neuro: in tact  Lab/X-ray:  Impression: This is a 56 y.o. female here for diatek catheter removal  Plan:  Removal of right diatek catheter  Newton Pigg, PA-C [facility] [phone] 11/23/2011  11:09 AM

## 2011-11-23 NOTE — Progress Notes (Signed)
1240  Patient voices understanding of dressing care and discharge instructions.  Dressing to upper chest clean dry and intact

## 2011-11-23 NOTE — Progress Notes (Signed)
[facility] Catheter Removal Procedure Note  Diagnosis: ESRD with Functioning AVF/AVGG  Plan:  Remove right diatek catheter  Consent signed:  yes Time out completed:  yes Coumadin:  no PT/INR (if applicable):   Other labs:   Procedure: 1.  Sterile prepping and draping over catheter area 2. 6 ml 2% lidocaine plain instilled at removal site. 3.  right catheter removed in its entirety with cuff in tact. 4.  Complications: none  5. Tip of catheter sent for culture:  no   Patient tolerated procedure well:  yes Pressure held, no bleeding noted, dressing applied Instructions given to the pt regarding wound care and bleeding.  Other:  [name] 11/23/2011 11:49 AM

## 2012-05-26 ENCOUNTER — Ambulatory Visit (INDEPENDENT_AMBULATORY_CARE_PROVIDER_SITE_OTHER): Payer: Medicare Other | Admitting: Internal Medicine

## 2012-05-26 ENCOUNTER — Encounter: Payer: Self-pay | Admitting: Internal Medicine

## 2012-05-26 VITALS — BP 100/62 | HR 89 | Ht 69.0 in | Wt 222.8 lb

## 2012-05-26 DIAGNOSIS — G4733 Obstructive sleep apnea (adult) (pediatric): Secondary | ICD-10-CM

## 2012-05-26 NOTE — Progress Notes (Signed)
  Subjective:    Patient ID: [name], female    DOB: 1956-05-09, 56 y.o.   MRN: [id number]  HPI 05/27/11- 73 yo F never smoker followed for OSA/ CSA, complicated by ESRD/ dialysis Last here November 12, 2010 [id number]- feels "used to it". Continues comfortable with it all night every night. Does take it off sometimes. Has too many antibodies for another try at renal transplant.  Has had flu shot. Denies any routine breathing concerns. Has already had flu shot.  05/26/12- 25 yo F never smoker followed for OSA/ CSA, complicated by ESRD/ dialysis, DM, HBP, SLE Wears CPAP 12/Advanced every night for approximately 4-6 hours; pressure doing well; nasal pillows. Got flu vaccine at dialysis.  Review of Systems-see HPI Constitutional:   No-   weight loss, night sweats, fevers, chills, fatigue, lassitude. HEENT:   No-  headaches, difficulty swallowing, tooth/dental problems, sore throat,       No-  sneezing, itching, ear ache, nasal congestion, post nasal drip,  CV:  No-   chest pain, orthopnea, PND, swelling in lower extremities, anasarca, dizziness, palpitations Resp: No-   shortness of breath with exertion or at rest.              No-   productive cough,  No non-productive cough,  No-  coughing up of blood.              No-   change in color of mucus.  No- wheezing.   Skin: No-   rash or lesions. GI:  No-   heartburn, indigestion, abdominal pain, nausea, vomiting,  GU:  MS:  No-   joint pain or swelling.  . Neuro- nothing unusual  Psych:  No- change in mood or affect. No depression or anxiety.  No memory loss.  Objective:   Physical Exam  General- Alert, Oriented, Affect-appropriate, Distress- none acute Skin- rash-none, lesions- none, excoriation- none Lymphadenopathy- none Head- atraumatic            Eyes- Gross vision intact, PERRLA, conjunctivae clear secretions            Ears- Hearing, canals normal            Nose- Clear, no-Septal dev, mucus, polyps, erosion, perforation        Throat- Mallampati II-III , mucosa clear , drainage- none, tonsils- atrophic Neck- flexible , trachea midline, no stridor , thyroid nl, carotid no bruit Chest - symmetrical excursion , unlabored           Heart/CV- RRR , no murmur , no gallop  , no rub, nl s1 s2. + bruit right upper anterior chest                           - JVD- none , edema- none, stasis changes- none, varices- none           Lung- clear to P&A, wheeze- none, cough- none , dullness-none, rub- none           Chest wall- +sternotomy  scar Abd- tender-no, distended-no, bowel sounds-present, HSM- no Br/ Gen/ Rectal- Not done, not indicated Extrem- +R BKA/ prosthesis/cane; access scars. +Access right upper extremity Neuro- grossly intact to observation Assessment & Plan:

## 2012-05-26 NOTE — Patient Instructions (Addendum)
Continue CPAP at 12/ Advanced.   Please call as needed if

## 2012-06-03 NOTE — Assessment & Plan Note (Signed)
Good compliance and control 

## 2012-06-22 ENCOUNTER — Other Ambulatory Visit: Payer: Self-pay | Admitting: Obstetrics and Gynecology

## 2012-06-22 DIAGNOSIS — Z1231 Encounter for screening mammogram for malignant neoplasm of breast: Secondary | ICD-10-CM

## 2012-06-26 ENCOUNTER — Other Ambulatory Visit (HOSPITAL_COMMUNITY): Payer: Self-pay | Admitting: Nephrology

## 2012-06-26 DIAGNOSIS — N186 End stage renal disease: Secondary | ICD-10-CM

## 2012-07-04 ENCOUNTER — Telehealth: Payer: Self-pay | Admitting: Hematology and Oncology

## 2012-07-04 NOTE — Telephone Encounter (Signed)
C/D 07/04/12 for appt. 07/11/12

## 2012-07-04 NOTE — Telephone Encounter (Signed)
S/W pt in re NP appt 10/29 @ 1 w/Dr. Dalene Carrow Referring Dr. Stephanie Acre Dx- Monoclonal Gamopathy NP packet mailed.

## 2012-07-11 ENCOUNTER — Other Ambulatory Visit (HOSPITAL_COMMUNITY): Payer: Self-pay | Admitting: Nephrology

## 2012-07-11 ENCOUNTER — Ambulatory Visit: Payer: Medicare Other

## 2012-07-11 ENCOUNTER — Telehealth: Payer: Self-pay | Admitting: Hematology and Oncology

## 2012-07-11 ENCOUNTER — Ambulatory Visit (HOSPITAL_BASED_OUTPATIENT_CLINIC_OR_DEPARTMENT_OTHER): Payer: Medicare Other | Admitting: Lab

## 2012-07-11 ENCOUNTER — Encounter: Payer: Self-pay | Admitting: Hematology and Oncology

## 2012-07-11 ENCOUNTER — Ambulatory Visit (HOSPITAL_BASED_OUTPATIENT_CLINIC_OR_DEPARTMENT_OTHER): Payer: Medicare Other | Admitting: Hematology and Oncology

## 2012-07-11 ENCOUNTER — Ambulatory Visit (HOSPITAL_COMMUNITY)
Admission: RE | Admit: 2012-07-11 | Discharge: 2012-07-11 | Disposition: A | Discharge status: Home / Self Care | Payer: Medicare Other | Source: Ambulatory Visit | Attending: Nephrology | Admitting: Nephrology

## 2012-07-11 VITALS — BP 132/72 | HR 100 | Temp 98.9°F | Resp 20 | Ht 69.5 in | Wt 224.9 lb

## 2012-07-11 DIAGNOSIS — I120 Hypertensive chronic kidney disease with stage 5 chronic kidney disease or end stage renal disease: Secondary | ICD-10-CM | POA: Insufficient documentation

## 2012-07-11 DIAGNOSIS — Y832 Surgical operation with anastomosis, bypass or graft as the cause of abnormal reaction of the patient, or of later complication, without mention of misadventure at the time of the procedure: Secondary | ICD-10-CM | POA: Insufficient documentation

## 2012-07-11 DIAGNOSIS — Z992 Dependence on renal dialysis: Secondary | ICD-10-CM | POA: Insufficient documentation

## 2012-07-11 DIAGNOSIS — N186 End stage renal disease: Secondary | ICD-10-CM

## 2012-07-11 DIAGNOSIS — D472 Monoclonal gammopathy: Secondary | ICD-10-CM

## 2012-07-11 DIAGNOSIS — E119 Type 2 diabetes mellitus without complications: Secondary | ICD-10-CM | POA: Insufficient documentation

## 2012-07-11 DIAGNOSIS — I871 Compression of vein: Secondary | ICD-10-CM | POA: Insufficient documentation

## 2012-07-11 DIAGNOSIS — I739 Peripheral vascular disease, unspecified: Secondary | ICD-10-CM

## 2012-07-11 DIAGNOSIS — T82898A Other specified complication of vascular prosthetic devices, implants and grafts, initial encounter: Secondary | ICD-10-CM | POA: Insufficient documentation

## 2012-07-11 LAB — COMPREHENSIVE METABOLIC PANEL (CC13)
AST: 17 U/L (ref 5–34)
Alkaline Phosphatase: 106 U/L (ref 40–150)
BUN: 25 mg/dL (ref 7.0–26.0)
Creatinine: 5.9 mg/dL (ref 0.6–1.1)

## 2012-07-11 MED ORDER — IOHEXOL 300 MG/ML  SOLN
100.0000 mL | Freq: Once | INTRAMUSCULAR | Status: AC | PRN
  Administered 2012-07-11: 50 mL via INTRAVENOUS

## 2012-07-11 NOTE — H&P (Signed)
Interventional Radiology Pre-Procedure H&P  Reason for Consult: Decreased access flows during hemodialysis Referring Physician: Deterding   HPI: [name] is an 56 y.o. female with ESRD on HD via a right upper arm brachial artery to transposed basilic vein AVF.  She has been experiencing decreased access flow during hemodialysis.  Diagnostic fistulagram demonstrates a significant stenosis in the mid aspect of the outflow vein.  We will proceed with venous angioplasty.    Past Medical History:  Past Medical History  Diagnosis Date  . Diabetes mellitus, type 2   . Kidney disease     ESRD secondary to DM, failed transplant 2006. Dialysis  . Secondary hyperparathyroidism   . OSA (obstructive sleep apnea)     NPSG 11.23.11-AHI 64.9/hr-central and obstructive apnea  . Ischemic cardiomyopathy     EF 25-30% 2011  . CAD (coronary artery disease)   . Pulmonary hypertension     Est PAsyst 60-Dr Mendel Ryder, off coumadin, CT neg for PE  . Hyperlipidemia   . Hypertension   . Anemia   . Thyroid disease   . Pulmonary edema   . Peripheral vascular disease   . Sebaceous cyst   . 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 RowePred/plaquenil    Surgical History:  Past Surgical History  Procedure Date  . Coronary artery bypass graft 2011    3V  . Insertion of dialysis catheter   . Above knee leg amputation     infection/gangrene 10/11/07  . Nasal mucosal biopsy   . Ovarian cyst surgery   . Removal of failed cadaveric renal transplant 2006-wound infection   . Laser retinal surgery 2011  . Kidney transplant     rejection    Family History:  Family History  Problem Relation Age of Onset  . Heart disease    . Edema Father     fluid overload  . Cerebral aneurysm Mother   . Stroke Brother   . Heart disease Brother     Social History:  reports that she has never smoked. She has never used smokeless tobacco. She reports that she does not drink alcohol or use illicit drugs.  Allergies:    Allergies  Allergen Reactions  . Ezetimibe-Simvastatin     REACTION: face edema; itching  . Klonopin (Clonazepam)   . Sertraline Hcl     Medications: I have reviewed the patient's current medications.  ROS: See HPI for pertinent findings, otherwise complete 10 system review negative.  Physical Exam: There were no vitals taken for this visit.  Cardiac: RRR Lungs: CTA B RUE: Transposed basilic vein AVF is pulsatile   Labs: CBC No results found for this basename: WBC:2,HGB:2,HCT:2,PLT:2 in the last 72 hours MET No results found for this basename: NA:2,K:2,CL:2,CO2:2,GLUCOSE:2,BUN:2,CREATININE:2,CALCIUM:2 in the last 72 hours No results found for this basename: PROT,ALBUMIN,AST,ALT,ALKPHOS,BILITOT,BILIDIR,IBILI,LIPASE in the last 72 hours PT/INR No results found for this basename: LABPROT:2,INR:2 in the last 72 hours ABG No results found for this basename: PHART:2,PCO2:2,PO2:2,HCO3:2 in the last 72 hours    No results found.  Assessment/Plan: Significant stenosis of the mid outflow vein. - Proceed with therapeutic venous PTA - Pt does not have a driver and wishes to proceed without sedation.  Signed,  Sterling Big, MD Vascular & Interventional Radiologist [facility]

## 2012-07-11 NOTE — Telephone Encounter (Signed)
Pt sent back to lab and given appt for bone survey. [name] in central aware of bx order and pt will be contacted w/appt. Pt aware. Per 10/29 pof f/u TBD.

## 2012-07-11 NOTE — Patient Instructions (Addendum)
[name]  [id number]   [facility] - AFTER VISIT SUMMARY   **RECOMMENDATIONS MADE BY THE CONSULTANT AND ANY TEST    RESULTS WILL BE SENT TO YOUR REFERRING DOCTORS.   YOUR EXAM FINDINGS, LABS AND RESULTS WERE DISCUSSED BY YOUR MD TODAY.  YOU CAN GO TO THE [facility] WEB SITE FOR INSTRUCTIONS ON HOW TO ASSESS MY CHART FOR ADDITIONAL INFORMATION AS NEEDED.  Your Updated drug allergies are: Allergies as of 07/11/2012 - Review Complete 05/26/2012  Allergen Reaction Noted  . Ezetimibe-simvastatin    . Klonopin (clonazepam)  05/21/2011  . Sertraline hcl  05/21/2011    Your current list of medications are: Current Outpatient Prescriptions  Medication Sig Dispense Refill  . acetaminophen (TYLENOL) 325 MG tablet Take 650 mg by mouth every 6 (six) hours as needed.        [name] aspirin 325 MG tablet Take 325 mg by mouth daily.        [name] atorvastatin (LIPITOR) 10 MG tablet Take 10 mg by mouth daily.        . B Complex-C-Folic Acid (DIALYVITE 800 PO) Take 1 tablet by mouth daily.        . cinacalcet (SENSIPAR) 60 MG tablet Take 60 mg by mouth daily.        . insulin aspart (NOVOLOG) 100 UNIT/ML injection Inject into the skin. Sliding scale      . insulin glargine (LANTUS) 100 UNIT/ML injection Inject into the skin at bedtime.        . midodrine (PROAMATINE) 5 MG tablet Take 5 mg by mouth 3 (three) times a week.       . pregabalin (LYRICA) 50 MG capsule Take 50 mg by mouth at bedtime.        . Sennosides (SENOKOT PO) Take 2 tablets by mouth at bedtime.        . sevelamer (RENVELA) 800 MG tablet Take by mouth. 3 tabs by mouth three times a day        No current facility-administered medications for this visit.   Facility-Administered Medications Ordered in Other Visits  Medication Dose Route Frequency Provider Last Rate Last Dose  . iohexol (OMNIPAQUE) 300 MG/ML solution 100 mL  100 mL Intravenous Once PRN Medication Radiologist, MD   50 mL at 07/11/12 0822     INSTRUCTIONS  GIVEN AND DISCUSSED:  See attached schedule   SPECIAL INSTRUCTIONS/FOLLOW-UP:  See above.  I acknowledge that I have been informed and understand all the instructions given to me and received a copy.I know to contact the clinic, my physician, or go to the emergency Department if any problems should occur.   I do not have any more questions at this time, but understand that I may call the [facility] at [phone] during business hours should I have any further questions or need assistance in obtaining follow-up care.

## 2012-07-11 NOTE — Procedures (Signed)
Interventional Radiology Procedure Note  Procedure: RUE fistulogram and venous PTA Complications: None Recommendations: - May resume dialysis - Please see dictated procedure in PACS - Access remains amenable to continued intervention as needed  Signed,  Sterling Big, MD Vascular & Interventional Radiologist [facility]

## 2012-07-11 NOTE — Progress Notes (Signed)
Checked in new patient. No financial issues. °

## 2012-07-11 NOTE — Progress Notes (Signed)
This office note has been dictated.

## 2012-07-11 NOTE — Progress Notes (Signed)
Dr. Darrick Penna- [facility]   [facility]  Dr. Verdis Prime- Cardiologist  Dr. Durwin Nora- [facility]  Preferred contact number [phone] (cell)  Ok to release information to:  Marni Griffon (Brother) Eilene Ghazi (Sister-in-law)

## 2012-07-12 ENCOUNTER — Telehealth: Payer: Self-pay | Admitting: Nurse Practitioner

## 2012-07-12 ENCOUNTER — Telehealth (HOSPITAL_COMMUNITY): Payer: Self-pay | Admitting: *Deleted

## 2012-07-12 NOTE — Telephone Encounter (Signed)
Radiology post procedure phone call, message left on identified answering machine.

## 2012-07-12 NOTE — Progress Notes (Signed)
CC:   Marlan Palau, M.D. Deirdre Peer. Polite, M.D. James L. Deterding, M.D.  IDENTIFYING STATEMENT:  The patient is a 56 year old woman seen at the request of Dr. Stephanie Acre with a monoclonal gamma protein.  The patient had presented to Neurology with 3-4 month history of numbness in her left foot.  She has type 2 diabetes.  She also has history of end-stage renal disease, for which she receives hemodialysis on Mondays, Wednesdays, and Fridays.  She has been doing so for 13 years.  She also has peripheral vascular disease and as result underwent right below-knee amputation in 2009.  She has no specific complaints of pain.  She has not lost weight.  She denies fever, chills, or night sweats.  She has a good appetite.  Her energy levels are fair.  On 06/28/2012 blood results from Dr. Clarisa Kindred office noted an M-spike of 1.5 g/dL with an IgG monoclonal protein with kappa light chain specificity.  IgG level was elevated at 2877, IgA 191, and IgM 30.  PAST MEDICAL HISTORY: 1. End-stage renal disease on hemodialysis on Mondays, Wednesdays, and     Fridays for 13 years.  2.  Peripheral vascular disease.  3.  Status     post right below-the-knee amputation in 2009.  4.  Coronary artery     bypass graft x1 vessel in 2011.  5.  Status post failed cadaveric     renal transplant.  6.  Insulin-requiring diabetes.  7.  Peripheral     neuropathy.  8.  Hyperlipidemia.  9.  Obesity.  ALLERGIES:  Simvastatin, Clonazepam, and sertraline.  MEDICATIONS:  Tylenol 650 mg every 6 hours as needed, aspirin 325 mg daily, Lipitor 10 mg daily, B complex, Sensipar 60 mg daily, insulin 100 units as needed, midodrine 5 mg 3 times a week, Lyrica 25 mg q.h.s., Senokot 2 tablets daily q.h.s.  SOCIAL HISTORY:  Patient is single.  She has no children.  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  She denies alcohol or tobacco use.  FAMILY HISTORY:  Mother had a cerebral aneurysm at age  25.  Brother died from pancreatic cancer.  Strong family history for end-stage renal disease secondary to diabetes.  A distant cousin had leukemia.  HEALTH MAINTENANCE:  She is up to date with mammogram.  She reports a colonoscopy at age 38.  REVIEW OF SYSTEMS:  Presently denies fever, chills, night sweats, anorexia, weight loss.  GI:  Denies nausea, vomiting, abdominal pain, diarrhea, melena, hematochezia.  GU:  Denies dysuria, hematuria, nocturia, frequency.  Cardiovascular:  Denies chest pain, PND, nocturia, ankle swelling.  Skin:  Denies bruising, petechiae, rash. Musculoskeletal:  Notes joint discomfort specifically with the right prosthesis.  Neurologic:  Notes neuropathy specifically involving her left foot.  Denies headaches, vision changes, extremity weakness.  PHYSICAL EXAMINATION:  General:  Alert and oriented x3.  Vitals:  Pulse 100, blood pressure 132/72, temperature 98.9, respirations 20, weight 224 pounds.  HEENT:  Head is atraumatic, normocephalic.  Sclerae anicteric.  Pupils equal, round, and reactive to light.  Mouth moist without ulcerations, thrush, or lesions.  Neck:  Supple.  Chest:  Clear to percussion and auscultation.  Few scattered rhonchi bibasilarly. Abdomen:  Soft.  No masses.  No hepatomegaly.  Bowel sounds present. Extremities:  Notes AV fistula.  Also notes the right below-knee prosthesis.  CNS:  Nonfocal.   IMPRESSION AND PLAN:  Ms. Solmon Ice is a pleasant 56 year old woman who has diabetes and end-stage renal disease on  hemodialysis.  She was recently found to have an immunoglobulin G monoclonal protein with kappa light chain specificity on recent blood work to work up peripheral neuropathy.  We spent more than half the time discussing plasma cell dyscrasia in general.  She requires blood work and radiological studies to further evaluate.  She will have a metastatic bone survey performed. She will have a CBC with comprehensive metabolic panel.  We will  repeat serum protein electrophoresis with quantitative immunoglobulins.  We will also include serum kappa light chains.  Her M-spike is about 1.5 g/dL.  Thus, we will have her obtain a bone marrow biopsy with aspiration with Interventional Radiology.  Ms. Byron will then returned to discuss results and further recommendations.  All questions were answered.  I spent more than half the time coordinating care.    ______________________________ Laurice Record, M.D. LIO/MEDQ  D:  07/11/2012  T:  07/12/2012  Job:  [id number]

## 2012-07-12 NOTE — Telephone Encounter (Signed)
Received notification from patient- BM biopsy has been scheduled for 11/12 at [facility].  Bone survey xray has been scheduled for 11/5 at [facility].  Information forwarded to Dr. Dalene Carrow.

## 2012-07-13 ENCOUNTER — Other Ambulatory Visit: Payer: Self-pay | Admitting: Radiology

## 2012-07-14 ENCOUNTER — Encounter (HOSPITAL_COMMUNITY): Payer: Self-pay | Admitting: Pharmacy Technician

## 2012-07-14 LAB — SPEP & IFE WITH QIG
Albumin ELP: 47.1 % — ABNORMAL LOW (ref 55.8–66.1)
Alpha-1-Globulin: 4.4 % (ref 2.9–4.9)
Gamma Globulin: 31.3 % — ABNORMAL HIGH (ref 11.1–18.8)
IgM, Serum: 27 mg/dL — ABNORMAL LOW (ref 52–322)

## 2012-07-14 LAB — KAPPA/LAMBDA LIGHT CHAINS
Kappa free light chain: 61.4 mg/dL — ABNORMAL HIGH (ref 0.33–1.94)
Lambda Free Lght Chn: 17.7 mg/dL — ABNORMAL HIGH (ref 0.57–2.63)

## 2012-07-17 ENCOUNTER — Other Ambulatory Visit: Payer: Self-pay | Admitting: *Deleted

## 2012-07-17 ENCOUNTER — Telehealth: Payer: Self-pay | Admitting: *Deleted

## 2012-07-17 NOTE — Telephone Encounter (Signed)
Patient confirmed over the phone the new date and time on 08-08-2012 9:00am

## 2012-07-18 ENCOUNTER — Ambulatory Visit (HOSPITAL_COMMUNITY)
Admission: RE | Admit: 2012-07-18 | Discharge: 2012-07-18 | Disposition: A | Discharge status: Home / Self Care | Payer: Medicare Other | Source: Ambulatory Visit | Attending: Hematology and Oncology | Admitting: Hematology and Oncology

## 2012-07-18 ENCOUNTER — Other Ambulatory Visit (HOSPITAL_COMMUNITY): Payer: Medicare Other

## 2012-07-18 ENCOUNTER — Encounter (HOSPITAL_COMMUNITY)
Admission: RE | Admit: 2012-07-18 | Discharge: 2012-07-18 | Disposition: A | Discharge status: Home / Self Care | Payer: Medicare Other | Source: Ambulatory Visit | Attending: Hematology and Oncology | Admitting: Hematology and Oncology

## 2012-07-18 ENCOUNTER — Encounter (HOSPITAL_COMMUNITY): Payer: Self-pay

## 2012-07-18 DIAGNOSIS — D472 Monoclonal gammopathy: Secondary | ICD-10-CM | POA: Insufficient documentation

## 2012-07-18 LAB — CBC
MCH: 27.7 pg (ref 26.0–34.0)
MCV: 84.6 fL (ref 78.0–100.0)
Platelets: 109 10*3/uL — ABNORMAL LOW (ref 150–400)
RBC: 4.48 MIL/uL (ref 3.87–5.11)

## 2012-07-18 MED ORDER — MIDAZOLAM HCL 2 MG/2ML IJ SOLN
INTRAMUSCULAR | Status: AC
  Filled 2012-07-18: qty 4

## 2012-07-18 MED ORDER — FENTANYL CITRATE 0.05 MG/ML IJ SOLN
INTRAMUSCULAR | Status: AC
  Filled 2012-07-18: qty 4

## 2012-07-18 MED ORDER — FENTANYL CITRATE 0.05 MG/ML IJ SOLN
INTRAMUSCULAR | Status: AC | PRN
  Administered 2012-07-18: 100 ug via INTRAVENOUS

## 2012-07-18 MED ORDER — SODIUM CHLORIDE 0.9 % IV SOLN
INTRAVENOUS | Status: DC
  Administered 2012-07-18: 08:00:00 via INTRAVENOUS

## 2012-07-18 MED ORDER — MIDAZOLAM HCL 2 MG/2ML IJ SOLN
INTRAMUSCULAR | Status: AC | PRN
  Administered 2012-07-18: 2 mg via INTRAVENOUS

## 2012-07-18 NOTE — Progress Notes (Signed)
Pt resting well and is without complaints.  She is tolerating po's.

## 2012-07-18 NOTE — Procedures (Signed)
BM BX - R iliac No comp

## 2012-07-18 NOTE — Progress Notes (Signed)
Pt's sister Meriam Sprague has called and is out front to pick pt up.  Pt taken out in wheelchair and is home with sister via private vehicle.

## 2012-07-18 NOTE — H&P (Signed)
Chief Complaint: "I"m here for a bone marrow biopsy" Referring Physician:Odogwu HPI: [name] is an 56 y.o. female with ESRD but has recently been found to have a monoclonal gamma protein with an M-Spike of 1.5 She is referred for bone marrow biopsy. She has never had before. She is familiar with radiology procedures as we have done her shuntograms/PTA in the past. She otherwise feels ok and offers no c/o.  Past Medical History:  Past Medical History  Diagnosis Date  . Diabetes mellitus, type 2   . Kidney disease     ESRD secondary to DM, failed transplant 2006. Dialysis  . Secondary hyperparathyroidism   . OSA (obstructive sleep apnea)     NPSG 11.23.11-AHI 64.9/hr-central and obstructive apnea  . Ischemic cardiomyopathy     EF 25-30% 2011  . CAD (coronary artery disease)   . Pulmonary hypertension     Est PAsyst 60-Dr Mendel Ryder, off coumadin, CT neg for PE  . Hyperlipidemia   . Hypertension   . Anemia   . Thyroid disease   . Pulmonary edema   . Peripheral vascular disease   . Sebaceous cyst   . 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 Velvet Bathe    Past Surgical History:  Past Surgical History  Procedure Date  . Coronary artery bypass graft 2011    3V  . Insertion of dialysis catheter   . Above knee leg amputation     infection/gangrene 10/11/07  . Nasal mucosal biopsy   . Ovarian cyst surgery   . Removal of failed cadaveric renal transplant 2006-wound infection   . Laser retinal surgery 2011  . Kidney transplant     rejection    Family History:  Family History  Problem Relation Age of Onset  . Heart disease    . Edema Father     fluid overload  . Cerebral aneurysm Mother   . Stroke Brother   . Heart disease Brother     Social History:  reports that she has never smoked. She has never used smokeless tobacco. She reports that she does not drink alcohol or use illicit drugs.  Allergies:  Allergies  Allergen Reactions  . Neurontin (Gabapentin)  Anaphylaxis  . Ezetimibe-Simvastatin     REACTION: face edema; itching  . Klonopin (Clonazepam) Other (See Comments)    hallucination  . Sertraline Hcl Other (See Comments)    unknown    Medications: acetaminophen (TYLENOL) 325 MG tablet (Taking) Sig - Route: Take 650 mg by mouth every 6 (six) hours as needed. - Oral Class: Historical Med Number of times this order has been changed since signing: 2 Order [address] aspirin 325 MG tablet (Taking) Sig - Route: Take 325 mg by mouth daily. - Oral Class: Historical Med Number of times this order has been changed since signing: 2 Order [address] atorvastatin (LIPITOR) 10 MG tablet (Taking) Sig - Route: Take 10 mg by mouth daily before breakfast. - Oral Class: Historical Med Number of times this order has been changed since signing: 4 Order [address] B Complex-C-Folic Acid (DIALYVITE 800 PO) (Taking) Sig - Route: Take 1 tablet by mouth daily. - Oral Class: Historical Med Number of times this order has been changed since signing: 2 Order [address] cinacalcet (SENSIPAR) 60 MG tablet (Taking) Sig - Route: Take 60 mg by mouth daily before breakfast. - Oral Class: Historical Med Number of times this order has been changed since signing: 4 Order [address] insulin aspart (NOVOLOG) 100 UNIT/ML  injection (Taking) Sig - Route: Inject 3-16 Units into the skin 3 (three) times daily before meals. Sliding scale - Subcutaneous Class: Historical Med Number of times this order has been changed since signing: 4 Order [address] insulin glargine (LANTUS) 100 UNIT/ML injection (Taking) Sig - Route: Inject 12 Units into the skin at bedtime. - Subcutaneous Class: Historical Med Number of times this order has been changed since signing: 4 Order [address] midodrine (PROAMATINE) 5 MG tablet (Taking) Sig - Route: Take 5 mg by mouth 3 (three) times a week. Monday Wednesday and friday - Oral Class: Historical Med Number of times this order has been changed since signing: 4 Order  [address] Sennosides (SENOKOT PO) (Taking) Sig - Route: Take 2 tablets by mouth at bedtime. - Oral Class: Historical Med Number of times this order has been changed since signing: 3 Order [address] sevelamer (RENVELA) 800 MG tablet (Taking) Sig - Route: Take 2,400 mg by mouth 3 (three) times daily with meals. 3 tabs by mouth three times a day - Oral Class: Historical Med Number of times this order has been changed since signing: 3 Order [address] pregabalin (LYRICA) 50 MG capsule (Taking/Discontinued) 07/14/2012 Sig - Route: Take 25 mg by mouth at bedtime. - Oral Class: Historical Med   Please HPI for pertinent positives, otherwise complete 10 system ROS negative.  Physical Exam: There were no vitals taken for this visit. There is no height or weight on file to calculate BMI.   General Appearance:  Alert, cooperative, no distress, appears stated age  Head:  Normocephalic, without obvious abnormality, atraumatic  ENT: Unremarkable  Neck: Supple, symmetrical, trachea midline, no adenopathy, thyroid: not enlarged, symmetric, no tenderness/mass/nodules  Lungs:   Clear to auscultation bilaterally, no w/r/r, respirations unlabored without use of accessory muscles.  Heart:  Regular rate and rhythm, S1, S2 normal, no murmur, rub or gallop. Carotids 2+ without bruit.  Extremities: Extremities normal, atraumatic, no cyanosis or edema  Neurologic: Normal affect, no gross deficits.   No results found for this or any previous visit (from the past 48 hour(s)). No results found.  Assessment/Plan Abnormal lab work Monoclonal gamma protein Discussed CT guided bone marrow biopsy with pt including risks and complications Labs pending Consent signed in chart  Brayton El PA-C 07/18/2012, 8:39 AM

## 2012-07-20 ENCOUNTER — Ambulatory Visit: Payer: Medicare Other

## 2012-07-25 ENCOUNTER — Ambulatory Visit (HOSPITAL_COMMUNITY)
Admission: RE | Admit: 2012-07-25 | Discharge: 2012-07-25 | Disposition: A | Discharge status: Home / Self Care | Payer: Medicare Other | Source: Ambulatory Visit | Attending: Hematology and Oncology | Admitting: Hematology and Oncology

## 2012-07-25 DIAGNOSIS — C9000 Multiple myeloma not having achieved remission: Secondary | ICD-10-CM | POA: Insufficient documentation

## 2012-07-25 DIAGNOSIS — D472 Monoclonal gammopathy: Secondary | ICD-10-CM | POA: Insufficient documentation

## 2012-08-08 ENCOUNTER — Encounter: Payer: Self-pay | Admitting: Hematology and Oncology

## 2012-08-08 ENCOUNTER — Ambulatory Visit
Admission: RE | Admit: 2012-08-08 | Discharge: 2012-08-08 | Disposition: A | Discharge status: Home / Self Care | Payer: Medicare Other | Source: Ambulatory Visit | Attending: Obstetrics and Gynecology | Admitting: Obstetrics and Gynecology

## 2012-08-08 ENCOUNTER — Ambulatory Visit (HOSPITAL_BASED_OUTPATIENT_CLINIC_OR_DEPARTMENT_OTHER): Payer: Medicare Other | Admitting: Hematology and Oncology

## 2012-08-08 ENCOUNTER — Telehealth: Payer: Self-pay | Admitting: Hematology and Oncology

## 2012-08-08 VITALS — BP 151/76 | HR 105 | Temp 98.2°F | Resp 20 | Ht 69.5 in | Wt 223.3 lb

## 2012-08-08 DIAGNOSIS — D472 Monoclonal gammopathy: Secondary | ICD-10-CM

## 2012-08-08 DIAGNOSIS — Z1231 Encounter for screening mammogram for malignant neoplasm of breast: Secondary | ICD-10-CM

## 2012-08-08 DIAGNOSIS — G5790 Unspecified mononeuropathy of unspecified lower limb: Secondary | ICD-10-CM

## 2012-08-08 DIAGNOSIS — I739 Peripheral vascular disease, unspecified: Secondary | ICD-10-CM

## 2012-08-08 DIAGNOSIS — N186 End stage renal disease: Secondary | ICD-10-CM

## 2012-08-08 NOTE — Progress Notes (Signed)
CC:   [name]. Polite, M.D. Marlan Palau, M.D. James L. Deterding, M.D.  IDENTIFYING STATEMENT:  The patient is a 56 year old woman who presents to discuss results.  INTERVAL HISTORY:  To summarize, the patient was referred from Neurology when she was found to have a monoclonal gamma protein during lab work. She had presented with a 3-4 month history of numbness in left foot. She also has type 2 diabetes, end-stage renal disease for which she receives dialysis 3 days a week.  She is here to discuss lab results which note the following:  On 07/18/2012 hemoglobin and hematocrit 12.4 and 37.9 respectively; platelets 109; IgG 3130, IgA 200, IgM 27; total protein 7.6; kappa free light chain 61.4.  Lambda free light chain 17.7.  Kappa lambda ratio 3.47.  M spike 1.63 g/dL.  Bone survey on 07/25/2012 showed no evidence of blastic or lytic lesions.  MEDICATIONS:  Reviewed and updated.  ALLERGIES:  Updated.  PAST MEDICAL HISTORY/FAMILY HISTORY/SOCIAL HISTORY:  Unchanged.  REVIEW OF SYSTEMS:  Ten-point review of systems was negative.  PHYSICAL EXAM:  General:  The patient is a well-appearing, well- nourished woman in no distress.  Vitals:  Pulse 105, blood pressure 151/76, temperature 98.2, respirations 20, weight 223 pounds.  HEENT: Head is atraumatic, normocephalic.  Sclerae anicteric.  Mouth moist. Chest/CVS:  Unremarkable.  Abdomen:  Soft.  Extremities:  Trace edema.  LABORATORY DATA:  As above.  In addition, a bone marrow biopsy with aspiration on 07/18/2012 showed a slightly hypercellular bone marrow for age with trilineage hematopoiesis.  Plasma cells totals 9%.  Ring sideroblasts were absent.  Storage iron was increased.  Megakaryocytes were abundant with predominantly normal morphology.  Sodium 140, potassium 3.6, chloride 98, CO2 32, BUN 25, creatinine 5.9 (the patient is on dialysis), glucose 149.  T bili 0.78, alkaline phos is 106, AST 17, ALT 15, calcium  9.4.  IMPRESSION AND PLAN:  Ms. 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  She has renal failure for which she is on dialysis     but this is felt to be secondary to a history of hypertension.  She     is also status post failed cadaveric renal transplant. 2. Peripheral vascular disease. 3. Peripheral neuropathy. 4. Hyperlipidemia.  We spent time discussing monoclonal gamma protein of undetermined significance specifically the IgG subtype.  The patient was told that the risk was relatively benign.  There is a 1% chance of this transferring to myeloma thus annual followup from hematology standpoint is required.  She thus follows up in 9 months' time.  She is to continue her other medical care with her other physicians.    ______________________________ Laurice Record, M.D. LIO/MEDQ  D:  08/08/2012  T:  08/08/2012  Job:  [id number]

## 2012-08-08 NOTE — Telephone Encounter (Signed)
gv pt appt schedule for August 2014. Pt aware she will be contacted by central re ct for August.

## 2012-08-08 NOTE — Patient Instructions (Addendum)
[name]  [id number]   [facility] - AFTER VISIT SUMMARY   **RECOMMENDATIONS MADE BY THE CONSULTANT AND ANY TEST    RESULTS WILL BE SENT TO YOUR REFERRING DOCTORS.   YOUR EXAM FINDINGS, LABS AND RESULTS WERE DISCUSSED BY YOUR MD TODAY.  YOU CAN GO TO THE [facility] WEB SITE FOR INSTRUCTIONS ON HOW TO ASSESS MY CHART FOR ADDITIONAL INFORMATION AS NEEDED.  Your Updated drug allergies are: Allergies as of 08/08/2012 - Review Complete 08/08/2012  Allergen Reaction Noted  . Neurontin (gabapentin) Anaphylaxis 07/14/2012  . Ezetimibe-simvastatin    . Klonopin (clonazepam) Other (See Comments) 05/21/2011  . Sertraline hcl Other (See Comments) 05/21/2011    Your current list of medications are: Current Outpatient Prescriptions  Medication Sig Dispense Refill  . acetaminophen (TYLENOL) 325 MG tablet Take 650 mg by mouth every 6 (six) hours as needed.        [name] aspirin 325 MG tablet Take 325 mg by mouth daily.        [name] atorvastatin (LIPITOR) 10 MG tablet Take 10 mg by mouth daily before breakfast.       . B Complex-C (B-COMPLEX WITH VITAMIN C) tablet Take 1 tablet by mouth daily.      . B Complex-C-Folic Acid (DIALYVITE 800 PO) Take 1 tablet by mouth daily.        . cinacalcet (SENSIPAR) 60 MG tablet Take 60 mg by mouth daily before breakfast.       . hydrOXYzine (ATARAX/VISTARIL) 10 MG tablet Take 10 mg by mouth 3 (three) times daily as needed. itching      . insulin aspart (NOVOLOG) 100 UNIT/ML injection Inject 3-16 Units into the skin 3 (three) times daily before meals. Sliding scale      . insulin glargine (LANTUS) 100 UNIT/ML injection Inject 12 Units into the skin at bedtime.       . midodrine (PROAMATINE) 5 MG tablet Take 5 mg by mouth 3 (three) times a week. Monday Wednesday and friday      . NON FORMULARY cpap machine at night      . pregabalin (LYRICA) 25 MG capsule Take 25 mg by mouth every evening.      [name] Propylene Glycol (SYSTANE BALANCE OP) Place 1 drop into  both eyes as needed. Dry eyes      . Sennosides (SENOKOT PO) Take 2 tablets by mouth at bedtime.       . sevelamer (RENVELA) 800 MG tablet Take 2,400 mg by mouth 3 (three) times daily with meals. 3 tabs by mouth three times a day         INSTRUCTIONS GIVEN AND DISCUSSED:  See attached schedule   SPECIAL INSTRUCTIONS/FOLLOW-UP:  See above.  I acknowledge that I have been informed and understand all the instructions given to me and received a copy.I know to contact the clinic, my physician, or go to the emergency Department if any problems should occur.   I do not have any more questions at this time, but understand that I may call the [facility] at [phone] during business hours should I have any further questions or need assistance in obtaining follow-up care.

## 2012-08-08 NOTE — Progress Notes (Signed)
This office note has been dictated.

## 2012-08-17 ENCOUNTER — Encounter: Payer: Self-pay | Admitting: Obstetrics and Gynecology

## 2012-09-11 ENCOUNTER — Other Ambulatory Visit: Payer: Self-pay | Admitting: *Deleted

## 2012-09-11 DIAGNOSIS — I739 Peripheral vascular disease, unspecified: Secondary | ICD-10-CM

## 2012-09-11 DIAGNOSIS — Z48812 Encounter for surgical aftercare following surgery on the circulatory system: Secondary | ICD-10-CM

## 2012-09-26 ENCOUNTER — Encounter: Payer: Self-pay | Admitting: Vascular Surgery

## 2012-09-27 ENCOUNTER — Ambulatory Visit: Payer: Medicare Other | Admitting: Vascular Surgery

## 2012-10-03 ENCOUNTER — Encounter: Payer: Self-pay | Admitting: Vascular Surgery

## 2012-10-04 ENCOUNTER — Other Ambulatory Visit: Payer: Self-pay | Admitting: *Deleted

## 2012-10-04 ENCOUNTER — Encounter (INDEPENDENT_AMBULATORY_CARE_PROVIDER_SITE_OTHER): Payer: Medicare Other | Admitting: *Deleted

## 2012-10-04 ENCOUNTER — Ambulatory Visit (INDEPENDENT_AMBULATORY_CARE_PROVIDER_SITE_OTHER): Payer: Medicare Other | Admitting: Vascular Surgery

## 2012-10-04 ENCOUNTER — Encounter: Payer: Self-pay | Admitting: Vascular Surgery

## 2012-10-04 VITALS — BP 132/79 | HR 93 | Resp 20 | Ht 69.0 in | Wt 210.0 lb

## 2012-10-04 DIAGNOSIS — I739 Peripheral vascular disease, unspecified: Secondary | ICD-10-CM

## 2012-10-04 DIAGNOSIS — Z48812 Encounter for surgical aftercare following surgery on the circulatory system: Secondary | ICD-10-CM

## 2012-10-04 NOTE — Assessment & Plan Note (Signed)
This patient has known infrainguinal arterial occlusive disease on the left. Her ABI on the left is down slightly from 89% to 71%. However, she is essentially asymptomatic. She does have paresthesias in the left foot related to her diabetic neuropathy. I've instructed her to keep a close eye on the wound on her left leg which has been improving slowly. I plan on seeing her back in one year and I have ordered follow up ABIs for that time. However, if she has any problems with the wound on her left leg healing she will call sooner.

## 2012-10-04 NOTE — Progress Notes (Signed)
[facility] [place]  Patient name: [name] MRN: [id number] DOB: August 24, 1956 Sex: female  REASON FOR VISIT: follow up of peripheral vascular disease  HPI: [name] is a 57 y.o. female who has undergone a previous right below-the-knee amputation in 2009. She has a history of peripheral vascular disease which we have been following on the left lower extremity. I last saw her on 07/14/2011. At that time, ABI on the left was 89%. She was essentially asymptomatic we stretched her follow up out to one year. She comes in today if she has been having paresthesias in her left foot. She notes markedly decreased sensation in the left foot. She has a history of diabetic neuropathy. She is ambulatory with her prosthesis. She denies any calf claudication thigh or hip claudication. She denies rest pain of the left foot. She has no ulcers on her foot that has a small pretibial wound related to a blister which she states has been improving gradually.  Past Medical History  Diagnosis Date  . Diabetes mellitus, type 2   . Kidney disease     ESRD secondary to DM, failed transplant 2006. Dialysis  . Secondary hyperparathyroidism   . OSA (obstructive sleep apnea)     NPSG 11.23.11-AHI 64.9/hr-central and obstructive apnea  . Ischemic cardiomyopathy     EF 25-30% 2011  . CAD (coronary artery disease)   . Pulmonary hypertension     Est PAsyst 60-Dr Mendel Ryder, off coumadin, CT neg for PE  . Hyperlipidemia   . Hypertension   . Anemia   . Thyroid disease   . Pulmonary edema   . Peripheral vascular disease   . Sebaceous cyst   . 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 Velvet Bathe    Family History  Problem Relation Age of Onset  . Heart disease    . Edema Father     fluid overload  . Cerebral aneurysm Mother   . Stroke Brother   . Heart disease Brother     SOCIAL HISTORY: History  Substance Use Topics  . Smoking status: Never Smoker   . Smokeless tobacco: Never Used    . Alcohol Use: No    Allergies  Allergen Reactions  . Neurontin (Gabapentin) Anaphylaxis  . Ezetimibe-Simvastatin     REACTION: face edema; itching  . Klonopin (Clonazepam) Other (See Comments)    hallucination  . Sertraline Hcl Other (See Comments)    unknown    Current Outpatient Prescriptions  Medication Sig Dispense Refill  . acetaminophen (TYLENOL) 325 MG tablet Take 650 mg by mouth every 6 (six) hours as needed.        [name] aspirin 325 MG tablet Take 325 mg by mouth daily.        [name] atorvastatin (LIPITOR) 10 MG tablet Take 10 mg by mouth daily before breakfast.       . B Complex-C (B-COMPLEX WITH VITAMIN C) tablet Take 1 tablet by mouth daily.      . B Complex-C-Folic Acid (DIALYVITE 800 PO) Take 1 tablet by mouth daily.        . cinacalcet (SENSIPAR) 60 MG tablet Take 60 mg by mouth daily before breakfast.       . hydrOXYzine (ATARAX/VISTARIL) 10 MG tablet Take 10 mg by mouth 3 (three) times daily as needed. itching      . insulin aspart (NOVOLOG) 100 UNIT/ML injection Inject 3-16 Units into the skin 3 (three) times daily before meals. Sliding scale      .  insulin glargine (LANTUS) 100 UNIT/ML injection Inject 12 Units into the skin at bedtime.       . midodrine (PROAMATINE) 5 MG tablet Take 5 mg by mouth 3 (three) times a week. Monday Wednesday and friday      . NON FORMULARY cpap machine at night      . pregabalin (LYRICA) 25 MG capsule Take 25 mg by mouth every evening.      [name] Propylene Glycol (SYSTANE BALANCE OP) Place 1 drop into both eyes as needed. Dry eyes      . Sennosides (SENOKOT PO) Take 2 tablets by mouth at bedtime.       . sevelamer (RENVELA) 800 MG tablet Take 2,400 mg by mouth 3 (three) times daily with meals. 3 tabs by mouth three times a day        REVIEW OF SYSTEMS: Arly.Keller ] denotes positive finding; [  ] denotes negative finding  CARDIOVASCULAR:  [ ]  chest pain   [ ]  chest pressure   [ ]  palpitations   [ ]  orthopnea   [ ]  dyspnea on exertion   [ ]  claudication    [ ]  rest pain   [ ]  DVT   [ ]  phlebitis PULMONARY:   [ ]  productive cough   [ ]  asthma   [ ]  wheezing NEUROLOGIC:   [ ]  weakness  Arly.Keller ] paresthesias left foot [ ]  aphasia  [ ]  amaurosis  [ ]  dizziness HEMATOLOGIC:   [ ]  bleeding problems   [ ]  clotting disorders MUSCULOSKELETAL:  [ ]  joint pain   [ ]  joint swelling [ ]  leg swelling GASTROINTESTINAL: [ ]   blood in stool  [ ]   hematemesis GENITOURINARY:  [ ]   dysuria  [ ]   hematuria PSYCHIATRIC:  [ ]  history of major depression INTEGUMENTARY:  [ ]  rashes  [ ]  ulcers CONSTITUTIONAL:  [ ]  fever   [ ]  chills  PHYSICAL EXAM: Filed Vitals:   10/04/12 1437  BP: 132/79  Pulse: 93  Resp: 20  Height: 5\' 9"  (1.753 m)  Weight: 210 lb (95.255 kg)   Body mass index is 31.01 kg/(m^2). GENERAL: The patient is a well-nourished female, in no acute distress. The vital signs are documented above. CARDIOVASCULAR: There is a regular rate and rhythm. Do not detect carotid bruits. She has palpable femoral pulses bilaterally. I cannot palpate a left popliteal pulse or pedal pulses on the left. She has mild left lower extremity swelling. PULMONARY: There is good air exchange bilaterally without wheezing or rales. ABDOMEN: Soft and non-tender with normal pitched bowel sounds.  MUSCULOSKELETAL: she has a right below the knee amputation. NEUROLOGIC: No focal weakness or paresthesias are detected. SKIN: There Is a small ulcer in the midportion of her anterior leg it measures less than a centimeter in diameter. There is no erythema or drainage. PSYCHIATRIC: The patient has a normal affect.  DATA:  I have independently interpreted her arterial Doppler study which shows an ABI of 71% on the left. This is down slightly compared to 89% over one year ago. She has a monophasic peroneal signal on the left and a monophasic dorsalis pedis signal. We did not detect a posterior tibial signal on the left.  MEDICAL ISSUES:  PVD (peripheral vascular disease) This patient has  known infrainguinal arterial occlusive disease on the left. Her ABI on the left is down slightly from 89% to 71%. However, she is essentially asymptomatic. She does have paresthesias in the left foot related to  her diabetic neuropathy. I've instructed her to keep a close eye on the wound on her left leg which has been improving slowly. I plan on seeing her back in one year and I have ordered follow up ABIs for that time. However, if she has any problems with the wound on her left leg healing she will call sooner.   Nardos Putnam S [facility] [place] Beeper: [phone]

## 2012-11-04 ENCOUNTER — Telehealth: Payer: Self-pay | Admitting: Oncology

## 2012-11-04 ENCOUNTER — Encounter: Payer: Self-pay | Admitting: Oncology

## 2012-11-04 NOTE — Telephone Encounter (Signed)
S/w the pt and she is aware of the reassigned md from dr lo to dr Arline Asp along with a calendar and a f/u letter.

## 2012-11-16 DIAGNOSIS — N186 End stage renal disease: Secondary | ICD-10-CM

## 2012-12-28 ENCOUNTER — Other Ambulatory Visit (HOSPITAL_COMMUNITY): Payer: Self-pay | Admitting: Nephrology

## 2012-12-28 DIAGNOSIS — N186 End stage renal disease: Secondary | ICD-10-CM

## 2012-12-29 ENCOUNTER — Telehealth: Payer: Self-pay | Admitting: *Deleted

## 2012-12-29 NOTE — Telephone Encounter (Signed)
sw pt informed her that DSM will be out of the office on 8/19. gv appt for a lab for 8/25 and a ov for 9/1. Pt is aware.

## 2012-12-29 NOTE — Telephone Encounter (Signed)
Pt called back gv later lab appt, and moved her 9/1 appt to 05/17/13...td

## 2013-01-02 ENCOUNTER — Ambulatory Visit (HOSPITAL_COMMUNITY): Admission: RE | Admit: 2013-01-02 | Payer: PRIVATE HEALTH INSURANCE | Source: Ambulatory Visit

## 2013-01-08 ENCOUNTER — Encounter (HOSPITAL_COMMUNITY): Payer: Self-pay | Admitting: Pharmacy Technician

## 2013-01-09 ENCOUNTER — Telehealth: Payer: Self-pay | Admitting: Vascular Surgery

## 2013-01-09 ENCOUNTER — Telehealth: Payer: Self-pay | Admitting: *Deleted

## 2013-01-09 ENCOUNTER — Other Ambulatory Visit (HOSPITAL_COMMUNITY): Payer: Self-pay | Admitting: Nephrology

## 2013-01-09 ENCOUNTER — Encounter (HOSPITAL_COMMUNITY): Payer: Self-pay

## 2013-01-09 ENCOUNTER — Ambulatory Visit (HOSPITAL_COMMUNITY)
Admission: RE | Admit: 2013-01-09 | Discharge: 2013-01-09 | Disposition: A | Discharge status: Home / Self Care | Payer: Medicare Other | Source: Ambulatory Visit | Attending: Nephrology | Admitting: Nephrology

## 2013-01-09 ENCOUNTER — Other Ambulatory Visit: Payer: Self-pay | Admitting: Obstetrics and Gynecology

## 2013-01-09 DIAGNOSIS — G4733 Obstructive sleep apnea (adult) (pediatric): Secondary | ICD-10-CM | POA: Insufficient documentation

## 2013-01-09 DIAGNOSIS — E079 Disorder of thyroid, unspecified: Secondary | ICD-10-CM | POA: Insufficient documentation

## 2013-01-09 DIAGNOSIS — N186 End stage renal disease: Secondary | ICD-10-CM

## 2013-01-09 DIAGNOSIS — I120 Hypertensive chronic kidney disease with stage 5 chronic kidney disease or end stage renal disease: Secondary | ICD-10-CM | POA: Insufficient documentation

## 2013-01-09 DIAGNOSIS — S78119A Complete traumatic amputation at level between unspecified hip and knee, initial encounter: Secondary | ICD-10-CM | POA: Insufficient documentation

## 2013-01-09 DIAGNOSIS — Z888 Allergy status to other drugs, medicaments and biological substances status: Secondary | ICD-10-CM | POA: Insufficient documentation

## 2013-01-09 DIAGNOSIS — I871 Compression of vein: Secondary | ICD-10-CM | POA: Insufficient documentation

## 2013-01-09 DIAGNOSIS — I2589 Other forms of chronic ischemic heart disease: Secondary | ICD-10-CM | POA: Insufficient documentation

## 2013-01-09 DIAGNOSIS — T82898A Other specified complication of vascular prosthetic devices, implants and grafts, initial encounter: Secondary | ICD-10-CM | POA: Insufficient documentation

## 2013-01-09 DIAGNOSIS — E119 Type 2 diabetes mellitus without complications: Secondary | ICD-10-CM | POA: Insufficient documentation

## 2013-01-09 DIAGNOSIS — I739 Peripheral vascular disease, unspecified: Secondary | ICD-10-CM | POA: Insufficient documentation

## 2013-01-09 DIAGNOSIS — I2789 Other specified pulmonary heart diseases: Secondary | ICD-10-CM | POA: Insufficient documentation

## 2013-01-09 DIAGNOSIS — I2510 Atherosclerotic heart disease of native coronary artery without angina pectoris: Secondary | ICD-10-CM | POA: Insufficient documentation

## 2013-01-09 DIAGNOSIS — N2581 Secondary hyperparathyroidism of renal origin: Secondary | ICD-10-CM | POA: Insufficient documentation

## 2013-01-09 DIAGNOSIS — E785 Hyperlipidemia, unspecified: Secondary | ICD-10-CM | POA: Insufficient documentation

## 2013-01-09 DIAGNOSIS — Y832 Surgical operation with anastomosis, bypass or graft as the cause of abnormal reaction of the patient, or of later complication, without mention of misadventure at the time of the procedure: Secondary | ICD-10-CM | POA: Insufficient documentation

## 2013-01-09 MED ORDER — IOHEXOL 300 MG/ML  SOLN
100.0000 mL | Freq: Once | INTRAMUSCULAR | Status: AC | PRN
  Administered 2013-01-09: 50 mL via INTRAVENOUS

## 2013-01-09 NOTE — Telephone Encounter (Signed)
Dr Fanny Dance called to say [name] has a new left heel ulcer that is growing pseudomonas and she would like her seen with a full work up. I sent a message to the front desk to make her an appt as soon as possible. Also, Dr Fanny Dance requested her ABIs from 1/14 & I faxed them to [phone]. She is sending her last office visit note.

## 2013-01-09 NOTE — Telephone Encounter (Signed)
Message copied by Margaretmary Eddy on Tue Jan 09, 2013  4:37 PM ------      Message from: Melene Plan      Created: Tue Jan 09, 2013 11:54 AM       Dr Fanny Dance called from wound center to say Ms [name] had a new left heel ulcer growing pseudomonus,She would like her seen sooner than later with a full workup.She is faxing over her last office visit with her. ------

## 2013-01-09 NOTE — Procedures (Signed)
SUCCESSFUL AVF ANGIOPLASTY NO COMP STABLE READY FOR USE FULL REPORT IN PACS

## 2013-01-09 NOTE — Telephone Encounter (Signed)
Gave patient appt info - kf

## 2013-01-09 NOTE — H&P (Signed)
[name] is an 57 y.o. female.   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 Diabetes mellitus, type 2   . Kidney disease     ESRD secondary to DM, failed transplant 2006. Dialysis  . Secondary hyperparathyroidism   . OSA (obstructive sleep apnea)     NPSG 11.23.11-AHI 64.9/hr-central and obstructive apnea  . Ischemic cardiomyopathy     EF 25-30% 2011  . CAD (coronary artery disease)   . Pulmonary hypertension     Est PAsyst 60-Dr [name], off coumadin, CT neg for PE  . Hyperlipidemia   . Hypertension   . Anemia   . Thyroid disease   . Pulmonary edema   . Peripheral vascular disease   . Sebaceous cyst   . 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 Velvet Rocha    Past Surgical History  Procedure Laterality Date  . Coronary artery bypass graft  2011    3V  . Insertion of dialysis catheter    . 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 Nasal mucosal biopsy    . Ovarian cyst surgery    . 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 Laser retinal surgery  2011  . Kidney transplant      rejection    Family History  Problem Relation Age of Onset  . Heart disease    . Edema Father     fluid overload  . Cerebral aneurysm Mother   . Stroke Brother   . Heart disease Brother    Social History:  reports that she has never smoked. She has never used smokeless tobacco. She reports that she does not drink alcohol or use illicit drugs.  Allergies:  Allergies  Allergen Reactions  . Neurontin (Gabapentin) Anaphylaxis  . Ezetimibe-Simvastatin     REACTION: face edema; itching  . Klonopin (Clonazepam) Other (See Comments)    hallucination  . Sertraline Hcl Other (See Comments)    unknown     (Not in a hospital  admission)  No results found for this or any previous visit (from the past 48 hour(s)). No results found.  Review of Systems  Constitutional: Negative for fever.  Respiratory: Negative for shortness of breath.   Cardiovascular: Negative for chest pain.  Gastrointestinal: Negative for nausea and vomiting.  Neurological: Negative for dizziness and headaches.    There were no vitals taken for this visit. Physical Exam  Constitutional: She is oriented to person, place, and time. She appears well-nourished.  Cardiovascular: Normal rate, regular rhythm and normal heart sounds.   No murmur heard. Respiratory: Effort normal and breath sounds normal. She has no wheezes.  GI: Soft. Bowel sounds are normal. There is no tenderness.  Musculoskeletal: Normal range of motion.  Uses cane  Neurological: She is alert and oriented to person, place, and time.  Skin: Skin is warm and dry.  Psychiatric: She has a normal mood and affect. Her behavior is normal. Judgment and thought content normal.     Assessment/Plan Rt arm dialysis fistula stenosis per imaging Scheduled now for pta/stent placement Pt aware of procedure benefits and risks and agreeable to proceed Consent signed and in chart  [name] A 01/09/2013, 8:04 AM

## 2013-01-10 ENCOUNTER — Telehealth (HOSPITAL_COMMUNITY): Payer: Self-pay | Admitting: *Deleted

## 2013-01-16 ENCOUNTER — Encounter: Payer: Self-pay | Admitting: Vascular Surgery

## 2013-01-17 ENCOUNTER — Ambulatory Visit (INDEPENDENT_AMBULATORY_CARE_PROVIDER_SITE_OTHER): Payer: Medicare Other | Admitting: Vascular Surgery

## 2013-01-17 ENCOUNTER — Encounter: Payer: Self-pay | Admitting: Vascular Surgery

## 2013-01-17 VITALS — BP 111/59 | HR 88 | Ht 69.0 in | Wt 210.0 lb

## 2013-01-17 DIAGNOSIS — L98499 Non-pressure chronic ulcer of skin of other sites with unspecified severity: Secondary | ICD-10-CM

## 2013-01-17 DIAGNOSIS — I739 Peripheral vascular disease, unspecified: Secondary | ICD-10-CM

## 2013-01-17 DIAGNOSIS — I7025 Atherosclerosis of native arteries of other extremities with ulceration: Secondary | ICD-10-CM | POA: Insufficient documentation

## 2013-01-17 NOTE — Progress Notes (Addendum)
[facility] [place] Referred by:  Ronald D Polite, MD [address] [place], [place] [address] Reason for referral: left heel ulcer, known PAD and DM  History of Present Illness  [name] is a 56 y.o. (06/13/1956) female who presents with chief complaint: left leg pain.  Onset of symptom occurred 12-16-2012.  Pain is described as aching, severity 4/10, and associated with walking.  Patient has attempted to treat this pain with dressing changes to the heel.  The patient has no rest painsymptoms. Atherosclerotic risk factors include: DM, PAD, CAD,hypertention, and hyperlipidemia.  Past Medical History  Diagnosis Date  . Diabetes mellitus, type 2   . Kidney disease     ESRD secondary to DM, failed transplant 2006. Dialysis  . Secondary hyperparathyroidism   . OSA (obstructive sleep apnea)     NPSG 11.23.11-AHI 64.9/hr-central and obstructive apnea  . Ischemic cardiomyopathy     EF 25-30% 2011  . CAD (coronary artery disease)   . Pulmonary hypertension     Est PAsyst 60-Dr H. Smith, off coumadin, CT neg for PE  . Hyperlipidemia   . Hypertension   . Anemia   . Thyroid disease   . Pulmonary edema   . Peripheral vascular disease   . Sebaceous cyst   . 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 RowePred/plaquenil   Past Surgical History  Procedure Laterality Date  . Coronary artery bypass graft  2011    3V  . Insertion of dialysis catheter    . 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 Nasal mucosal biopsy    . Ovarian cyst surgery    . 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 Laser retinal surgery  2011  . Kidney transplant      rejection   History   Social History  . Marital Status: Single    Spouse Name: N/A    Number of Children: N/A  . Years of Education: N/A   Occupational History  . Not on file.   Social History Main Topics  . Smoking status: Never Smoker   . Smokeless tobacco: Never  Used  . Alcohol Use: No  . Drug Use: No  . Sexually Active: Not on file   Other Topics Concern  . Not on file   Social History Narrative  . No narrative on file   Family History  Problem Relation Age of Onset  . Heart disease    . Edema Father     fluid overload  . Cerebral aneurysm Mother   . Stroke Brother   . Heart disease Brother    Current Outpatient Prescriptions on File Prior to Visit  Medication Sig Dispense Refill  . acetaminophen (TYLENOL) 325 MG tablet Take 650 mg by mouth every 6 (six) hours as needed.        . aspirin 325 MG tablet Take 325 mg by mouth daily.        . atorvastatin (LIPITOR) 10 MG tablet Take 10 mg by mouth daily before breakfast.       . B Complex-C (B-COMPLEX WITH VITAMIN C) tablet Take 1 tablet by mouth daily.      . B Complex-C-Folic Acid (DIALYVITE 800 PO) Take 1 tablet by mouth daily.        . cinacalcet (SENSIPAR) 60 MG tablet Take 60 mg by mouth daily before breakfast.       . collagenase (SANTYL)   ointment Apply 1 application topically every other day.      . diphenhydrAMINE (BENADRYL) 25 mg capsule Take 25 mg by mouth every 6 (six) hours as needed for itching or allergies.      . hydrOXYzine (ATARAX/VISTARIL) 10 MG tablet Take 10 mg by mouth 3 (three) times daily as needed. itching      . insulin aspart (NOVOLOG) 100 UNIT/ML injection Inject 3-16 Units into the skin 3 (three) times daily before meals. Sliding scale      . insulin glargine (LANTUS) 100 UNIT/ML injection Inject 12 Units into the skin at bedtime.       . midodrine (PROAMATINE) 10 MG tablet Take 10 mg by mouth 3 (three) times a week. Take on Monday, Wednesday, and Friday      . pregabalin (LYRICA) 50 MG capsule Take 50 mg by mouth at bedtime.      . Propylene Glycol (SYSTANE BALANCE OP) Place 1 drop into both eyes as needed. Dry eyes      . sevelamer (RENVELA) 800 MG tablet Take 2,400 mg by mouth 3 (three) times daily with meals. 3 tabs by mouth three times a day       No  current facility-administered medications on file prior to visit.   Allergies  Allergen Reactions  . Neurontin (Gabapentin) Anaphylaxis  . Ezetimibe-Simvastatin     REACTION: face edema; itching  . Klonopin (Clonazepam) Other (See Comments)    hallucination  . Sertraline Hcl Other (See Comments)    unknown   REVIEW OF SYSTEMS:  (Positives checked otherwise negative)  CARDIOVASCULAR:  [ ] chest pain, [ ] chest pressure, [ ] palpitations, [ ] shortness of breath when laying flat, [ ] shortness of breath with exertion,   [ ] pain in feet when walking, [ ] pain in feet when laying flat, [ ] history of blood clot in veins (DVT), [ ] history of phlebitis, [ ] swelling in legs, [ ] varicose veins  PULMONARY:  [ ] productive cough, [ ] asthma, [ ] wheezing  NEUROLOGIC:  [ ] weakness in arms or legs, [x ] left foot numbness in arms or legs, [ ] difficulty speaking or slurred speech, [ ] temporary loss of vision in one eye, [ ] dizziness  HEMATOLOGIC:  [ ] bleeding problems, [ ] problems with blood clotting too easily  MUSCULOSKEL:  [ ] joint pain, [ ] joint swelling  GASTROINTEST:  [ ]  Vomiting blood, [ ]  Blood in stool     GENITOURINARY:  [ ]  Burning with urination, [ ]  Blood in urine [x] she is on dialysis  M-W-F  PSYCHIATRIC:  [ ] history of major depression  INTEGUMENTARY:  [ ] rashes, [ ] ulcers  CONSTITUTIONAL:  [ ] fever, [ ] chills  Physical Examination  Filed Vitals:   01/17/13 1515  BP: 111/59  Pulse: 88  Height: 5' 9" (1.753 m)  Weight: 210 lb (95.255 kg)  SpO2: 100%   Body mass index is 31 kg/(m^2).  General: A&O x 3, WDWN, Cachectic / Ill appear / Somulent  Head: [place]/AT  Ear/Nose/Throat: Hearing grossly intact, nares w/o erythema or drainage, oropharynx w/o Erythema/Exudate, Hearing loss Eyes: PERRLA, EOMI,   Neck: Supple, no nuchal rigidity  Pulmonary: Sym exp, good air movt, CTAB, no rales, rhonchi, & wheezing  Cardiac: RRR, Nl S1, S2, no Murmurs,  rubs or gallops  Vascular: Vessel Right Left  Radial Palpable Palpable  Ulnar Palpable Palpable    Brachial Palpable Palpable  Carotid Palpable, without bruit Palpable, without bruit  Aorta Not palpable N/A  Femoral Palpable Palpable  Popliteal Not palpable Not palpable  PT na not Palpable  DP na not Palpable   Gastrointestinal: soft, NTND,   Musculoskeletal: M/S 5/5 throughout, Extremities without ischemic changes.  BKA right LE.  Neurologic: CN 2-12 intact, Pain and light touch intact in extremities, Motor exam as listed above  Psychiatric: Judgment intact, Mood & affect appropriatefor pt's clinical situation  Dermatologic: See M/S exam for extremity exam, no rashes otherwise noted  Lymph : No Cervical, Axillary, or Inguinal lymphadenopathy   Non-Invasive Vascular Imaging  ABI (Date: 10-04-1012)  RLE: NA BKA  LLE: 0.71    Medical Decision Making  Takiya M Baiza is a 56 y.o. female who presents with: Left heel ulcer.  We will order a left LE angiogram.  She has had decreased ABI's on the left LE from 0.89 to 0.7 in the last year.  She has know DM and history of right BKA.   She will continue seeing the wound care center for wound care.    Denai Caba MAUREEN PA-C [facility] [place] Office: [phone]   01/17/2013, 4:11 PM  Agree with above. This patient has a nonhealing wound of the left heel with evidence of peripheral vascular disease. Clearly this is a limb threatening problem. I recommended that we proceed with arteriography.I have reviewed with the patient the indications for arteriography. In addition, I have reviewed the potential complications of arteriography including but not limited to: Bleeding, arterial injury, arterial thrombosis, dye action, renal insufficiency, or other unpredictable medical problems. I have explained to the patient that if we find disease amenable to angioplasty we could potentially address this at the  same time. I have discussed the potential complications of angioplasty and stenting, including but not limited to: Bleeding, arterial thrombosis, arterial injury, dissection, or the need for surgical intervention. I'll make further recommendations pending these results. She may potentially require left common iliac artery angioplasty and possible infrainguinal bypass on the left for limb salvage.  Christopher Dickson, MD, FACS Beeper [phone] 01/17/2013  

## 2013-01-19 ENCOUNTER — Other Ambulatory Visit: Payer: Self-pay

## 2013-01-25 ENCOUNTER — Ambulatory Visit (HOSPITAL_COMMUNITY)
Admission: RE | Admit: 2013-01-25 | Discharge: 2013-01-25 | Disposition: A | Discharge status: Home / Self Care | Payer: Medicare Other | Source: Ambulatory Visit | Attending: Vascular Surgery | Admitting: Vascular Surgery

## 2013-01-25 ENCOUNTER — Other Ambulatory Visit: Payer: Self-pay | Admitting: *Deleted

## 2013-01-25 ENCOUNTER — Encounter (HOSPITAL_COMMUNITY)
Admission: RE | Disposition: A | Discharge status: Home / Self Care | Payer: Self-pay | Source: Ambulatory Visit | Attending: Vascular Surgery

## 2013-01-25 ENCOUNTER — Telehealth: Payer: Self-pay | Admitting: Vascular Surgery

## 2013-01-25 DIAGNOSIS — E785 Hyperlipidemia, unspecified: Secondary | ICD-10-CM | POA: Insufficient documentation

## 2013-01-25 DIAGNOSIS — I739 Peripheral vascular disease, unspecified: Secondary | ICD-10-CM

## 2013-01-25 DIAGNOSIS — N186 End stage renal disease: Secondary | ICD-10-CM | POA: Insufficient documentation

## 2013-01-25 DIAGNOSIS — L98499 Non-pressure chronic ulcer of skin of other sites with unspecified severity: Secondary | ICD-10-CM | POA: Insufficient documentation

## 2013-01-25 DIAGNOSIS — Z888 Allergy status to other drugs, medicaments and biological substances status: Secondary | ICD-10-CM | POA: Insufficient documentation

## 2013-01-25 DIAGNOSIS — G4733 Obstructive sleep apnea (adult) (pediatric): Secondary | ICD-10-CM | POA: Insufficient documentation

## 2013-01-25 DIAGNOSIS — L97409 Non-pressure chronic ulcer of unspecified heel and midfoot with unspecified severity: Secondary | ICD-10-CM | POA: Insufficient documentation

## 2013-01-25 DIAGNOSIS — Z7982 Long term (current) use of aspirin: Secondary | ICD-10-CM | POA: Insufficient documentation

## 2013-01-25 DIAGNOSIS — E1129 Type 2 diabetes mellitus with other diabetic kidney complication: Secondary | ICD-10-CM | POA: Insufficient documentation

## 2013-01-25 DIAGNOSIS — I2589 Other forms of chronic ischemic heart disease: Secondary | ICD-10-CM | POA: Insufficient documentation

## 2013-01-25 DIAGNOSIS — I2510 Atherosclerotic heart disease of native coronary artery without angina pectoris: Secondary | ICD-10-CM | POA: Insufficient documentation

## 2013-01-25 DIAGNOSIS — E079 Disorder of thyroid, unspecified: Secondary | ICD-10-CM | POA: Insufficient documentation

## 2013-01-25 DIAGNOSIS — Z01810 Encounter for preprocedural cardiovascular examination: Secondary | ICD-10-CM

## 2013-01-25 DIAGNOSIS — Z79899 Other long term (current) drug therapy: Secondary | ICD-10-CM | POA: Insufficient documentation

## 2013-01-25 DIAGNOSIS — I120 Hypertensive chronic kidney disease with stage 5 chronic kidney disease or end stage renal disease: Secondary | ICD-10-CM | POA: Insufficient documentation

## 2013-01-25 DIAGNOSIS — Z794 Long term (current) use of insulin: Secondary | ICD-10-CM | POA: Insufficient documentation

## 2013-01-25 DIAGNOSIS — S88119A Complete traumatic amputation at level between knee and ankle, unspecified lower leg, initial encounter: Secondary | ICD-10-CM | POA: Insufficient documentation

## 2013-01-25 HISTORY — PX: ABDOMINAL ANGIOGRAM: SHX5705

## 2013-01-25 HISTORY — PX: LOWER EXTREMITY ANGIOGRAM: SHX5508

## 2013-01-25 LAB — POCT I-STAT, CHEM 8
HCT: 49 % — ABNORMAL HIGH (ref 36.0–46.0)
Hemoglobin: 16.7 g/dL — ABNORMAL HIGH (ref 12.0–15.0)
Potassium: 3.2 mEq/L — ABNORMAL LOW (ref 3.5–5.1)
Sodium: 139 mEq/L (ref 135–145)
TCO2: 34 mmol/L (ref 0–100)

## 2013-01-25 LAB — GLUCOSE, CAPILLARY: Glucose-Capillary: 112 mg/dL — ABNORMAL HIGH (ref 70–99)

## 2013-01-25 SURGERY — ANGIOGRAM, LOWER EXTREMITY
Anesthesia: LOCAL | Laterality: Left

## 2013-01-25 MED ORDER — FENTANYL CITRATE 0.05 MG/ML IJ SOLN
INTRAMUSCULAR | Status: AC
  Filled 2013-01-25: qty 2

## 2013-01-25 MED ORDER — HEPARIN (PORCINE) IN NACL 2-0.9 UNIT/ML-% IJ SOLN
INTRAMUSCULAR | Status: AC
  Filled 2013-01-25: qty 1000

## 2013-01-25 MED ORDER — ONDANSETRON HCL 4 MG/2ML IJ SOLN: 4.0000 mg | Freq: Four times a day (QID) | INTRAMUSCULAR | Status: DC | PRN

## 2013-01-25 MED ORDER — SODIUM CHLORIDE 0.9 % IV SOLN: 250.0000 mL | INTRAVENOUS | Status: DC | PRN

## 2013-01-25 MED ORDER — SODIUM CHLORIDE 0.9 % IJ SOLN: 3.0000 mL | INTRAMUSCULAR | Status: DC | PRN

## 2013-01-25 MED ORDER — MORPHINE SULFATE 2 MG/ML IJ SOLN: 2.0000 mg | INTRAMUSCULAR | Status: DC | PRN

## 2013-01-25 MED ORDER — LIDOCAINE HCL (PF) 1 % IJ SOLN
INTRAMUSCULAR | Status: AC
  Filled 2013-01-25: qty 30

## 2013-01-25 MED ORDER — ACETAMINOPHEN 325 MG PO TABS: 650.0000 mg | ORAL_TABLET | ORAL | Status: DC | PRN

## 2013-01-25 MED ORDER — SODIUM CHLORIDE 0.9 % IJ SOLN: 3.0000 mL | Freq: Two times a day (BID) | INTRAMUSCULAR | Status: DC

## 2013-01-25 MED ORDER — OXYCODONE-ACETAMINOPHEN 5-325 MG PO TABS: 1.0000 | ORAL_TABLET | ORAL | Status: DC | PRN

## 2013-01-25 NOTE — Interval H&P Note (Signed)
[facility] [place]  History and Physical Update  The patient was interviewed and re-examined.  The patient's previous History and Physical has been reviewed and is unchanged from Dickson's consult on: 01/17/13.  There is no change in the plan of care: Aortogram, bilateral leg runoff, and possible intervention.  Leonides Sake, MD [facility] [place] Office: [phone] Pager: [phone]  01/25/2013, 7:22 AM

## 2013-01-25 NOTE — H&P (View-Only) (Signed)
[facility] [place] Referred by:  Katy Apo, MD [address] , [place] [address] Reason for referral: left heel ulcer, known PAD and DM  History of Present Illness  [name] is a 57 y.o. (1955/09/19) female who presents with chief complaint: left leg pain.  Onset of symptom occurred 12-16-2012.  Pain is described as aching, severity 4/10, and associated with walking.  Patient has attempted to treat this pain with dressing changes to the heel.  The patient has no rest painsymptoms. Atherosclerotic risk factors include: DM, PAD, CAD,hypertention, and hyperlipidemia.  Past Medical History  Diagnosis Date  . Diabetes mellitus, type 2   . Kidney disease     ESRD secondary to DM, failed transplant 2006. Dialysis  . Secondary hyperparathyroidism   . OSA (obstructive sleep apnea)     NPSG 11.23.11-AHI 64.9/hr-central and obstructive apnea  . Ischemic cardiomyopathy     EF 25-30% 2011  . CAD (coronary artery disease)   . Pulmonary hypertension     Est PAsyst 60-Dr Mendel Ryder, off coumadin, CT neg for PE  . Hyperlipidemia   . Hypertension   . Anemia   . Thyroid disease   . Pulmonary edema   . Peripheral vascular disease   . Sebaceous cyst   . 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 Velvet Bathe   Past Surgical History  Procedure Laterality Date  . Coronary artery bypass graft  2011    3V  . Insertion of dialysis catheter    . 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 Nasal mucosal biopsy    . Ovarian cyst surgery    . 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 Laser retinal surgery  2011  . Kidney transplant      rejection   History   Social History  . Marital Status: Single    Spouse Name: N/A    Number of Children: N/A  . Years of Education: N/A   Occupational History  . Not on file.   Social History Main Topics  . Smoking status: Never Smoker   . Smokeless tobacco: Never  Used  . Alcohol Use: No  . Drug Use: No  . Sexually Active: Not on file   Other Topics Concern  . Not on file   Social History Narrative  . No narrative on file   Family History  Problem Relation Age of Onset  . Heart disease    . Edema Father     fluid overload  . Cerebral aneurysm Mother   . Stroke Brother   . Heart disease Brother    Current Outpatient Prescriptions on File Prior to Visit  Medication Sig Dispense Refill  . acetaminophen (TYLENOL) 325 MG tablet Take 650 mg by mouth every 6 (six) hours as needed.        [name] aspirin 325 MG tablet Take 325 mg by mouth daily.        [name] atorvastatin (LIPITOR) 10 MG tablet Take 10 mg by mouth daily before breakfast.       . B Complex-C (B-COMPLEX WITH VITAMIN C) tablet Take 1 tablet by mouth daily.      . B Complex-C-Folic Acid (DIALYVITE 800 PO) Take 1 tablet by mouth daily.        . cinacalcet (SENSIPAR) 60 MG tablet Take 60 mg by mouth daily before breakfast.       . collagenase (SANTYL)  ointment Apply 1 application topically every other day.      . diphenhydrAMINE (BENADRYL) 25 mg capsule Take 25 mg by mouth every 6 (six) hours as needed for itching or allergies.      . hydrOXYzine (ATARAX/VISTARIL) 10 MG tablet Take 10 mg by mouth 3 (three) times daily as needed. itching      . insulin aspart (NOVOLOG) 100 UNIT/ML injection Inject 3-16 Units into the skin 3 (three) times daily before meals. Sliding scale      . insulin glargine (LANTUS) 100 UNIT/ML injection Inject 12 Units into the skin at bedtime.       . midodrine (PROAMATINE) 10 MG tablet Take 10 mg by mouth 3 (three) times a week. Take on Monday, Wednesday, and Friday      . pregabalin (LYRICA) 50 MG capsule Take 50 mg by mouth at bedtime.      [name] Propylene Glycol (SYSTANE BALANCE OP) Place 1 drop into both eyes as needed. Dry eyes      . sevelamer (RENVELA) 800 MG tablet Take 2,400 mg by mouth 3 (three) times daily with meals. 3 tabs by mouth three times a day       No  current facility-administered medications on file prior to visit.   Allergies  Allergen Reactions  . Neurontin (Gabapentin) Anaphylaxis  . Ezetimibe-Simvastatin     REACTION: face edema; itching  . Klonopin (Clonazepam) Other (See Comments)    hallucination  . Sertraline Hcl Other (See Comments)    unknown   REVIEW OF SYSTEMS:  (Positives checked otherwise negative)  CARDIOVASCULAR:  [ ]  chest pain, [ ]  chest pressure, [ ]  palpitations, [ ]  shortness of breath when laying flat, [ ]  shortness of breath with exertion,   [ ]  pain in feet when walking, [ ]  pain in feet when laying flat, [ ]  history of blood clot in veins (DVT), [ ]  history of phlebitis, [ ]  swelling in legs, [ ]  varicose veins  PULMONARY:  [ ]  productive cough, [ ]  asthma, [ ]  wheezing  NEUROLOGIC:  [ ]  weakness in arms or legs, [x ] left foot numbness in arms or legs, [ ]  difficulty speaking or slurred speech, [ ]  temporary loss of vision in one eye, [ ]  dizziness  HEMATOLOGIC:  [ ]  bleeding problems, [ ]  problems with blood clotting too easily  MUSCULOSKEL:  [ ]  joint pain, [ ]  joint swelling  GASTROINTEST:  [ ]   Vomiting blood, [ ]   Blood in stool     GENITOURINARY:  [ ]   Burning with urination, [ ]   Blood in urine [x]  she is on dialysis  M-W-F  PSYCHIATRIC:  [ ]  history of major depression  INTEGUMENTARY:  [ ]  rashes, [ ]  ulcers  CONSTITUTIONAL:  [ ]  fever, [ ]  chills  Physical Examination  Filed Vitals:   01/17/13 1515  BP: 111/59  Pulse: 88  Height: 5\' 9"  (1.753 m)  Weight: 210 lb (95.255 kg)  SpO2: 100%   Body mass index is 31 kg/(m^2).  General: A&O x 3, WDWN, Cachectic / Ill appear / Somulent  Head: [place]/AT  Ear/Nose/Throat: Hearing grossly intact, nares w/o erythema or drainage, oropharynx w/o Erythema/Exudate, Hearing loss Eyes: PERRLA, EOMI,   Neck: Supple, no nuchal rigidity  Pulmonary: Sym exp, good air movt, CTAB, no rales, rhonchi, & wheezing  Cardiac: RRR, Nl S1, S2, no Murmurs,  rubs or gallops  Vascular: Vessel Right Left  Radial Palpable Palpable  Ulnar Palpable Palpable  Brachial Palpable Palpable  Carotid Palpable, without bruit Palpable, without bruit  Aorta Not palpable N/A  Femoral Palpable Palpable  Popliteal Not palpable Not palpable  PT na not Palpable  DP na not Palpable   Gastrointestinal: soft, NTND,   Musculoskeletal: M/S 5/5 throughout, Extremities without ischemic changes.  BKA right LE.  Neurologic: CN 2-12 intact, Pain and light touch intact in extremities, Motor exam as listed above  Psychiatric: Judgment intact, Mood & affect appropriatefor pt's clinical situation  Dermatologic: See M/S exam for extremity exam, no rashes otherwise noted  Lymph : No Cervical, Axillary, or Inguinal lymphadenopathy   Non-Invasive Vascular Imaging  ABI (Date: 10-04-1012)  RLE: NA BKA  LLE: 0.71    Medical Decision Making  [name] is a 57 y.o. female who presents with: Left heel ulcer.  We will order a left LE angiogram.  She has had decreased ABI's on the left LE from 0.89 to 0.7 in the last year.  She has know DM and history of right BKA.   She will continue seeing the wound care center for wound care.    Thomasena Edis Terrace Chiem [facility] PA-C [facility] [place] Office: [phone]   01/17/2013, 4:11 PM  Agree with above. This patient has a nonhealing wound of the left heel with evidence of peripheral vascular disease. Clearly this is a limb threatening problem. I recommended that we proceed with arteriography.I have reviewed with the patient the indications for arteriography. In addition, I have reviewed the potential complications of arteriography including but not limited to: Bleeding, arterial injury, arterial thrombosis, dye action, renal insufficiency, or other unpredictable medical problems. I have explained to the patient that if we find disease amenable to angioplasty we could potentially address this at the  same time. I have discussed the potential complications of angioplasty and stenting, including but not limited to: Bleeding, arterial thrombosis, arterial injury, dissection, or the need for surgical intervention. I'll make further recommendations pending these results. She may potentially require left common iliac artery angioplasty and possible infrainguinal bypass on the left for limb salvage.  Waverly Ferrari, MD, FACS Beeper [phone] 01/17/2013

## 2013-01-25 NOTE — Op Note (Signed)
OPERATIVE NOTE   PROCEDURE: 1.  Right common femoral artery cannulation under ultrasound guidance 2.  Aortogram 3.  Second order arterial selection 4.  Left leg runoff  PRE-OPERATIVE DIAGNOSIS: Left heel ulcer  POST-OPERATIVE DIAGNOSIS: same as above   SURGEON: Leonides Sake, MD  ANESTHESIA: conscious sedation  ESTIMATED BLOOD LOSS: 50 cc  CONTRAST: 55 cc  FINDING(S):  Aorta: patent  Superior mesenteric artery: patent Celiac artery: patent  Right Left  RA Occluded Occluded  CIA Patent Patent  EIA Patent Patent  IIA Patent Patent  CFA Patent with 30% stenosis Patent  SFA  Patent with two 30% stenoses in mid-segment, distally tapers at Hunter's canal to 90% stenosis  PFA  Patent  Pop  Patent but 2 cm 50% stenosis with 90% stenosis in below-the-knee popliteal artery  Trif  Patent  AT  Patent, dominant runoff to feet  Pero  Patent  PT  Patent proximally, occludes distally   Left foot perfuses from anterior tibial artery.  SPECIMEN(S):  none  INDICATIONS:   [name] is a 57 y.o. female who presents with left heel ulcer.  The patient presents for: aortogram, left leg runoff, and possible intervention.  I discussed with the patient the nature of angiographic procedures, especially the limited patencies of any endovascular intervention.  The patient is aware of that the risks of an angiographic procedure include but are not limited to: bleeding, infection, access site complications, renal failure, embolization, rupture of vessel, dissection, possible need for emergent surgical intervention, possible need for surgical procedures to treat the patient's pathology, and stroke and death.  The patient is aware of the risks and agrees to proceed.  DESCRIPTION: After full informed consent was obtained from the patient, the patient was brought back to the angiography suite.  The patient was placed supine upon the angiography table and connected to monitoring equipment.  The  patient was then given conscious sedation, the amounts of which are documented in the patient's chart.  The patient was prepped and drape in the standard fashion for an angiographic procedure.  At this point, attention was turned to the right groin.  Under ultrasound guidance, the right common femoral artery was cannulated with a micropuncture needle.  The microwire was advanced into the iliac arterial system.  The needle was exchanged for a microsheath, which was loaded into the common femoral artery over the wire.  The microwire was exchanged for a [facility] wire which was advanced into the aorta.  The microsheath was then exchanged for a 5-Fr sheath which was loaded into the common femoral artery.  The Omniflush catheter was then loaded over the wire up to the level of L1.  The catheter was connected to the power injector circuit.  After de-airring and de-clotting the circuit, a power injector aortogram was completed.  The [facility] wire was replaced in the catheter, and using the [place] and Omniflush, the left common iliac artery was selected.  The wire continued to select the internal iliac artery, so I replaced it with a Glidewire.  Using the Omniflush and Glidewire, I selected out the left external iliac artery.  The wire was removed.  The catheter was connected to the power injector circuit after de-airring and de-clotting the circuit.  An automated left leg runoff was completed.  The findings are listed above.  COMPLICATIONS: none  CONDITION: stable   Leonides Sake, MD [facility] [place] Office: [phone] Pager: [phone]  01/25/2013, 8:25 AM

## 2013-01-25 NOTE — Telephone Encounter (Addendum)
Message copied by Fredrich Birks on Thu Jan 25, 2013  3:31 PM ------      Message from: Phillips Odor      Created: Thu Jan 25, 2013  9:14 AM                   ----- Message -----         From: Fransisco Hertz, MD         Sent: 01/25/2013   8:32 AM           To: Reuel Derby, Melene Plan, RN            OLIE SCAFFIDI      [id number]      January 04, 1956                  PROCEDURE:      1.  Right common femoral artery cannulation under ultrasound guidance      2.  Aortogram      3.  Second order arterial selection      4.  Left leg runoff            Follow-up: with Dr. Edilia Bo next week with LLE Vein mapping       ------   01/25/13: spoke with pt, she is aware of appt, dpm

## 2013-01-30 ENCOUNTER — Encounter: Payer: Self-pay | Admitting: Vascular Surgery

## 2013-01-31 ENCOUNTER — Encounter (INDEPENDENT_AMBULATORY_CARE_PROVIDER_SITE_OTHER): Payer: Medicare Other | Admitting: *Deleted

## 2013-01-31 ENCOUNTER — Ambulatory Visit (INDEPENDENT_AMBULATORY_CARE_PROVIDER_SITE_OTHER): Payer: Medicare Other | Admitting: Vascular Surgery

## 2013-01-31 ENCOUNTER — Encounter: Payer: Self-pay | Admitting: Vascular Surgery

## 2013-01-31 VITALS — BP 98/66 | HR 92 | Resp 16 | Ht 69.0 in | Wt 210.0 lb

## 2013-01-31 DIAGNOSIS — Z01810 Encounter for preprocedural cardiovascular examination: Secondary | ICD-10-CM

## 2013-01-31 DIAGNOSIS — I739 Peripheral vascular disease, unspecified: Secondary | ICD-10-CM

## 2013-01-31 DIAGNOSIS — M79609 Pain in unspecified limb: Secondary | ICD-10-CM | POA: Insufficient documentation

## 2013-01-31 MED ORDER — OXYCODONE-ACETAMINOPHEN 5-325 MG PO TABS: 1.0000 | ORAL_TABLET | ORAL | Status: DC | PRN

## 2013-01-31 MED ORDER — OXYCODONE HCL 5 MG PO TABS: 5.0000 mg | ORAL_TABLET | Freq: Three times a day (TID) | ORAL | Status: DC | PRN

## 2013-01-31 NOTE — Progress Notes (Signed)
[facility] [place]  Patient name: [name] MRN: [id number] DOB: 1956/05/30 Sex: female  REASON FOR ADMISSION: nonhealing wound of the left heel.  HPI: [name] is a 57 y.o. female who has undergone previous right below the knee amputation. I saw her on consultation on 01/17/2013 with a nonhealing wound of the left heel. She had evidence of infrainguinal arterial occlusive disease and this was clearly a limb threatening problem. She has a complicated medical history including a history of end-stage renal disease on dialysis, diabetes, and  Ischemic cardiomyopathy with an ejection fraction of 25-30%.  She underwent an arteriogram by Dr. Imogene Burn appear this showed moderate to severe disease in the distal superficial femoral artery with a 90% stenosis at Hunter's canal. She had disease in the popliteal artery including it stenosis below the knee. Her dominant runoff was the anterior tibial artery. As you tibial artery was occluded distally.  She comes in today with continued issues with the wound her left foot which has not shown any significant evidence of healing. He denies fever or chills.  Past Medical History  Diagnosis Date  . Diabetes mellitus, type 2   . Kidney disease     ESRD secondary to DM, failed transplant 2006. Dialysis  . Secondary hyperparathyroidism   . OSA (obstructive sleep apnea)     NPSG 11.23.11-AHI 64.9/hr-central and obstructive apnea  . Ischemic cardiomyopathy     EF 25-30% 2011  . CAD (coronary artery disease)   . Pulmonary hypertension     Est PAsyst 60-Dr Mendel Ryder, off coumadin, CT neg for PE  . Hyperlipidemia   . Hypertension   . Anemia   . Thyroid disease   . Pulmonary edema   . Peripheral vascular disease   . Sebaceous cyst   . 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 Velvet Bathe    Family History  Problem Relation Age of Onset  . Heart disease    . Edema Father     fluid overload  . Cerebral aneurysm Mother   .  Stroke Brother   . Heart disease Brother    For [employer] Use Only  PRE-ADM LIVING: [place] ] Home, [ ]  Nursing home, [ ]  Homeless  AMB STATUS: [ ]  Walking, [place] ] Walking w/ Assistance, [ ]  Wheelchair, [ ] Bed ridden  RECENT HEART ATTACK (<6 mon): No  CAD Sx: Ar[place] ] No, [ ]  Asx, h/o MI, [ ]  Stable angina, [ ]  Unstable angina  PRIOR CHF: Arly.Keller ] No, [ ]  Asx, [ ]  Mild, [ ]  Moderate, [ ]  Severe  STRESS TEST: Arly.Keller ] No, [ ]  Normal, [ ]  + ischemia, [ ]  + MI, [ ]  Both  SOCIAL HISTORY: History  Substance Use Topics  . Smoking status: Never Smoker   . Smokeless tobacco: Never Used  . Alcohol Use: No   Allergies  Allergen Reactions  . Neurontin (Gabapentin) Anaphylaxis  . Ezetimibe-Simvastatin     REACTION: face edema; itching  . Klonopin (Clonazepam) Other (See Comments)    hallucination  . Sertraline Hcl Other (See Comments)    unknown   Current Outpatient Prescriptions  Medication Sig Dispense Refill  . acetaminophen (TYLENOL) 325 MG tablet Take 650 mg by mouth every 6 (six) hours as needed.        [name] aspirin 325 MG tablet Take 325 mg by mouth daily.        [name] atorvastatin (LIPITOR) 10 MG tablet Take 10 mg by mouth daily before  breakfast.       . B Complex-C (B-COMPLEX WITH VITAMIN C) tablet Take 1 tablet by mouth daily.      . B Complex-C-Folic Acid (DIALYVITE 800 PO) Take 1 tablet by mouth daily.        . cinacalcet (SENSIPAR) 60 MG tablet Take 60 mg by mouth daily before breakfast.       . collagenase (SANTYL) ointment Apply 1 application topically every other day.      . diphenhydrAMINE (BENADRYL) 25 mg capsule Take 25 mg by mouth every 6 (six) hours as needed for itching or allergies.      . hydrOXYzine (ATARAX/VISTARIL) 10 MG tablet Take 10 mg by mouth 3 (three) times daily as needed. itching      . ibuprofen (ADVIL,MOTRIN) 600 MG tablet continuous as needed.      . insulin aspart (NOVOLOG) 100 UNIT/ML injection Inject 3-16 Units into the skin 3 (three) times daily before meals. Sliding scale       . insulin glargine (LANTUS) 100 UNIT/ML injection Inject 12 Units into the skin at bedtime.       . midodrine (PROAMATINE) 10 MG tablet Take 10 mg by mouth 3 (three) times a week. Take on Monday, Wednesday, and Friday      . pregabalin (LYRICA) 50 MG capsule Take 50 mg by mouth at bedtime.      [name] Propylene Glycol (SYSTANE BALANCE OP) Place 1 drop into both eyes as needed. Dry eyes      . sevelamer (RENVELA) 800 MG tablet Take 2,400 mg by mouth 3 (three) times daily with meals. 3 tabs by mouth three times a day      . oxyCODONE (OXY IR/ROXICODONE) 5 MG immediate release tablet Take 1 tablet (5 mg total) by mouth every 8 (eight) hours as needed for pain.  30 tablet  0  . oxyCODONE-acetaminophen (ROXICET) 5-325 MG per tablet Take 1-2 tablets by mouth every 4 (four) hours as needed for pain.  30 tablet  0   No current facility-administered medications for this visit.   REVIEW OF SYSTEMS: [place] ] denotes positive finding; [  ] denotes negative finding CARDIOVASCULAR:  [ ]  chest pain   [ ]  chest pressure   [ ]  palpitations   [ ]  orthopnea   Arly.[place] ] dyspnea on exertion   [ ]  claudication   [ ]  rest pain   [ ]  DVT   [ ]  phlebitis PULMONARY:   [ ]  productive cough   [ ]  asthma   [ ]  wheezing NEUROLOGIC:   [ ]  weakness  [ ]  paresthesias  [ ]  aphasia  [ ]  amaurosis  [ ]  dizziness HEMATOLOGIC:   [ ]  bleeding problems   [ ]  clotting disorders MUSCULOSKELETAL:  [ ]  joint pain   [ ]  joint swelling [ ]  leg swelling GASTROINTESTINAL: [ ]   blood in stool  [ ]   hematemesis GENITOURINARY:  [ ]   dysuria  [ ]   hematuria PSYCHIATRIC:  [ ]  history of major depression INTEGUMENTARY:  [ ]  rashes  [ ]  ulcers CONSTITUTIONAL:  [ ]  fever   [ ]  chills  PHYSICAL EXAM: Filed Vitals:   01/31/13 1545  BP: 98/66  Pulse: 92  Resp: 16  Height: 5\' 9"  (1.753 m)  Weight: 210 lb (95.255 kg)  SpO2: 97%   Body mass index is 31 kg/(m^2). GENERAL: The patient is a well-nourished female, in no acute distress. The vital signs are  documented above. CARDIOVASCULAR: There is  a regular rate and rhythm. He has palpable femoral pulses. I cannot palpate popliteal or pedal pulses on the left. She has a BKA on the right. PULMONARY: There is good air exchange bilaterally without wheezing or rales. ABDOMEN: Soft and non-tender with normal pitched bowel sounds.  MUSCULOSKELETAL: There are no major deformities or cyanosis. NEUROLOGIC: No focal weakness or paresthesias are detected. SKIN: There are no ulcers or rashes noted. PSYCHIATRIC: The patient has a normal affect.  DATA:  Reviewed her arteriogram the results of which are described above.  I have independently interpreted her vein mapping which shows that she does appear to have a reasonable greater saphenous vein on the left to the proximal calf. At that level it becomes somewhat small  MEDICAL ISSUES:  Atherosclerosis of native arteries of the extremities with ulceration(440.23) This patient has a large wound measuring 6 cm x 5 cm on the medial left heel. This is clearly a limb threatening situation. Situation is complicated by her diabetes, peripheral vascular disease, end-stage renal disease, and cardiac history. I think without revascularization she would require a below the knee amputation. Her best chance for limb salvage would be femoral to popliteal artery bypass with vein. Even with successful bypass there is risk of limb loss.I have reviewed the indications for lower extremity bypass. I have also reviewed the potential complications of surgery including but not limited to: wound healing problems, infection, graft thrombosis, limb loss, or other unpredictable medical problems. All the patient's questions were answered and they are agreeable to proceed. Given her cardiac history we will try to arrange for preoperative cardiac evaluation. Tentatively her surgery is scheduled for 02/20/2013.   of note, I did give her a prescription for 30 oxycodone for  pain.  Yurika Pereda S [facility] [place] Beeper: [phone]

## 2013-01-31 NOTE — Assessment & Plan Note (Signed)
This patient has a large wound measuring 6 cm x 5 cm on the medial left heel. This is clearly a limb threatening situation. Situation is complicated by her diabetes, peripheral vascular disease, end-stage renal disease, and cardiac history. I think without revascularization she would require a below the knee amputation. Her best chance for limb salvage would be femoral to popliteal artery bypass with vein. Even with successful bypass there is risk of limb loss.I have reviewed the indications for lower extremity bypass. I have also reviewed the potential complications of surgery including but not limited to: wound healing problems, infection, graft thrombosis, limb loss, or other unpredictable medical problems. All the patient's questions were answered and they are agreeable to proceed. Given her cardiac history we will try to arrange for preoperative cardiac evaluation. Tentatively her surgery is scheduled for 02/20/2013.

## 2013-02-01 ENCOUNTER — Other Ambulatory Visit: Payer: Self-pay | Admitting: Obstetrics and Gynecology

## 2013-02-01 ENCOUNTER — Other Ambulatory Visit: Payer: Self-pay | Admitting: *Deleted

## 2013-02-01 ENCOUNTER — Telehealth: Payer: Self-pay | Admitting: Vascular Surgery

## 2013-02-01 DIAGNOSIS — Z01818 Encounter for other preprocedural examination: Secondary | ICD-10-CM

## 2013-02-01 DIAGNOSIS — N6310 Unspecified lump in the right breast, unspecified quadrant: Secondary | ICD-10-CM

## 2013-02-01 DIAGNOSIS — I739 Peripheral vascular disease, unspecified: Secondary | ICD-10-CM

## 2013-02-01 NOTE — Telephone Encounter (Signed)
Message copied by Margaretmary Eddy on Thu Feb 01, 2013  4:39 PM ------      Message from: Phillips Odor      Created: Wed Jan 31, 2013  5:11 PM      Regarding: FW: preop cardiac eval                   ----- Message -----         From: Chuck Hint, MD         Sent: 01/31/2013   5:10 PM           To: Melene Plan, RN, Conley Simmonds Pullins, RN      Subject: preop cardiac eval                                       I realize when I dictated this patient later that she has a significant cardiac history. It would be best for her to have preoperative cardiac evaluation prior to femoropopliteal bypass grafting on 02/20/2013. Thank you.CD ------

## 2013-02-01 NOTE — Telephone Encounter (Signed)
Spoke with pt - told her [name] from Dr. Sherilyn Cooter Smith's office [facility] cardiology) said she would send info to Dr. Katrinka Blazing and he would fax [place] a letter of clearance prior to her appt so she did not have to go to his office - kf

## 2013-02-02 ENCOUNTER — Encounter (HOSPITAL_COMMUNITY): Payer: Self-pay | Admitting: *Deleted

## 2013-02-02 ENCOUNTER — Other Ambulatory Visit: Payer: Self-pay | Admitting: *Deleted

## 2013-02-02 ENCOUNTER — Emergency Department (HOSPITAL_COMMUNITY): Payer: Medicare Other

## 2013-02-02 ENCOUNTER — Encounter: Payer: Self-pay | Admitting: *Deleted

## 2013-02-02 ENCOUNTER — Inpatient Hospital Stay (HOSPITAL_COMMUNITY)
Admission: EM | Admit: 2013-02-02 | Discharge: 2013-02-03 | DRG: 638 | Disposition: A | Discharge status: Home / Self Care | Payer: Medicare Other | Attending: Internal Medicine | Admitting: Internal Medicine

## 2013-02-02 DIAGNOSIS — I499 Cardiac arrhythmia, unspecified: Secondary | ICD-10-CM

## 2013-02-02 DIAGNOSIS — D649 Anemia, unspecified: Secondary | ICD-10-CM | POA: Diagnosis present

## 2013-02-02 DIAGNOSIS — L98499 Non-pressure chronic ulcer of skin of other sites with unspecified severity: Secondary | ICD-10-CM | POA: Diagnosis present

## 2013-02-02 DIAGNOSIS — G4733 Obstructive sleep apnea (adult) (pediatric): Secondary | ICD-10-CM | POA: Diagnosis present

## 2013-02-02 DIAGNOSIS — I739 Peripheral vascular disease, unspecified: Secondary | ICD-10-CM | POA: Diagnosis present

## 2013-02-02 DIAGNOSIS — M329 Systemic lupus erythematosus, unspecified: Secondary | ICD-10-CM | POA: Diagnosis present

## 2013-02-02 DIAGNOSIS — I255 Ischemic cardiomyopathy: Secondary | ICD-10-CM | POA: Diagnosis present

## 2013-02-02 DIAGNOSIS — E1129 Type 2 diabetes mellitus with other diabetic kidney complication: Secondary | ICD-10-CM | POA: Diagnosis present

## 2013-02-02 DIAGNOSIS — Z992 Dependence on renal dialysis: Secondary | ICD-10-CM

## 2013-02-02 DIAGNOSIS — I2510 Atherosclerotic heart disease of native coronary artery without angina pectoris: Secondary | ICD-10-CM | POA: Diagnosis present

## 2013-02-02 DIAGNOSIS — I2589 Other forms of chronic ischemic heart disease: Secondary | ICD-10-CM | POA: Diagnosis present

## 2013-02-02 DIAGNOSIS — I10 Essential (primary) hypertension: Secondary | ICD-10-CM | POA: Diagnosis present

## 2013-02-02 DIAGNOSIS — N186 End stage renal disease: Secondary | ICD-10-CM | POA: Diagnosis present

## 2013-02-02 DIAGNOSIS — N2581 Secondary hyperparathyroidism of renal origin: Secondary | ICD-10-CM | POA: Diagnosis present

## 2013-02-02 DIAGNOSIS — E785 Hyperlipidemia, unspecified: Secondary | ICD-10-CM | POA: Diagnosis present

## 2013-02-02 DIAGNOSIS — E1169 Type 2 diabetes mellitus with other specified complication: Principal | ICD-10-CM | POA: Diagnosis present

## 2013-02-02 DIAGNOSIS — I7025 Atherosclerosis of native arteries of other extremities with ulceration: Secondary | ICD-10-CM | POA: Diagnosis present

## 2013-02-02 DIAGNOSIS — L97509 Non-pressure chronic ulcer of other part of unspecified foot with unspecified severity: Secondary | ICD-10-CM | POA: Diagnosis present

## 2013-02-02 DIAGNOSIS — E1351 Other specified diabetes mellitus with diabetic peripheral angiopathy without gangrene: Secondary | ICD-10-CM | POA: Diagnosis present

## 2013-02-02 DIAGNOSIS — I120 Hypertensive chronic kidney disease with stage 5 chronic kidney disease or end stage renal disease: Secondary | ICD-10-CM | POA: Diagnosis present

## 2013-02-02 DIAGNOSIS — S88119A Complete traumatic amputation at level between knee and ankle, unspecified lower leg, initial encounter: Secondary | ICD-10-CM

## 2013-02-02 LAB — CBC
MCH: 30.1 pg (ref 26.0–34.0)
MCV: 86.3 fL (ref 78.0–100.0)
Platelets: 263 10*3/uL (ref 150–400)
RDW: 16 % — ABNORMAL HIGH (ref 11.5–15.5)

## 2013-02-02 LAB — PROCALCITONIN: Procalcitonin: 0.98 ng/mL

## 2013-02-02 LAB — GLUCOSE, CAPILLARY: Glucose-Capillary: 146 mg/dL — ABNORMAL HIGH (ref 70–99)

## 2013-02-02 LAB — CG4 I-STAT (LACTIC ACID): Lactic Acid, Venous: 2.43 mmol/L — ABNORMAL HIGH (ref 0.5–2.2)

## 2013-02-02 MED ORDER — COLLAGENASE 250 UNIT/GM EX OINT
1.0000 "application " | TOPICAL_OINTMENT | CUTANEOUS | Status: DC
  Administered 2013-02-03: 1 via TOPICAL
  Filled 2013-02-02: qty 30

## 2013-02-02 MED ORDER — PREGABALIN 50 MG PO CAPS
50.0000 mg | ORAL_CAPSULE | Freq: Every day | ORAL | Status: DC
  Administered 2013-02-03: 50 mg via ORAL
  Filled 2013-02-02: qty 1

## 2013-02-02 MED ORDER — PIPERACILLIN-TAZOBACTAM IN DEX 2-0.25 GM/50ML IV SOLN
2.2500 g | Freq: Three times a day (TID) | INTRAVENOUS | Status: DC
  Administered 2013-02-03 (×2): 2.25 g via INTRAVENOUS
  Filled 2013-02-02 (×5): qty 50

## 2013-02-02 MED ORDER — NAPHAZOLINE HCL 0.1 % OP SOLN
1.0000 [drp] | Freq: Four times a day (QID) | OPHTHALMIC | Status: DC | PRN
  Filled 2013-02-02: qty 15

## 2013-02-02 MED ORDER — INSULIN GLARGINE 100 UNIT/ML ~~LOC~~ SOLN
12.0000 [IU] | Freq: Every day | SUBCUTANEOUS | Status: DC
  Filled 2013-02-02 (×2): qty 0.12

## 2013-02-02 MED ORDER — ATORVASTATIN CALCIUM 10 MG PO TABS
10.0000 mg | ORAL_TABLET | Freq: Every day | ORAL | Status: DC
  Administered 2013-02-03: 10 mg via ORAL
  Filled 2013-02-02 (×2): qty 1

## 2013-02-02 MED ORDER — PIPERACILLIN-TAZOBACTAM 3.375 G IVPB 30 MIN: 3.3750 g | Freq: Once | INTRAVENOUS | Status: DC

## 2013-02-02 MED ORDER — SEVELAMER CARBONATE 800 MG PO TABS
2400.0000 mg | ORAL_TABLET | Freq: Three times a day (TID) | ORAL | Status: DC
  Administered 2013-02-03: 2400 mg via ORAL
  Filled 2013-02-02 (×4): qty 3

## 2013-02-02 MED ORDER — ONDANSETRON HCL 4 MG/2ML IJ SOLN: 4.0000 mg | Freq: Four times a day (QID) | INTRAMUSCULAR | Status: DC | PRN

## 2013-02-02 MED ORDER — VANCOMYCIN HCL 10 G IV SOLR
2000.0000 mg | INTRAVENOUS | Status: AC
  Administered 2013-02-02: 2000 mg via INTRAVENOUS
  Filled 2013-02-02: qty 2000

## 2013-02-02 MED ORDER — MIDODRINE HCL 5 MG PO TABS: 10.0000 mg | ORAL_TABLET | ORAL | Status: DC

## 2013-02-02 MED ORDER — OXYCODONE HCL 5 MG PO TABS
5.0000 mg | ORAL_TABLET | Freq: Three times a day (TID) | ORAL | Status: DC | PRN
  Administered 2013-02-03 (×2): 5 mg via ORAL
  Filled 2013-02-02 (×2): qty 1

## 2013-02-02 MED ORDER — B COMPLEX-C PO TABS
1.0000 | ORAL_TABLET | Freq: Every day | ORAL | Status: DC
  Administered 2013-02-03: 1 via ORAL
  Filled 2013-02-02: qty 1

## 2013-02-02 MED ORDER — IBUPROFEN 600 MG PO TABS
300.0000 mg | ORAL_TABLET | Freq: Four times a day (QID) | ORAL | Status: DC | PRN
  Filled 2013-02-02: qty 1

## 2013-02-02 MED ORDER — VANCOMYCIN HCL IN DEXTROSE 1-5 GM/200ML-% IV SOLN: 1000.0000 mg | Freq: Once | INTRAVENOUS | Status: DC

## 2013-02-02 MED ORDER — INSULIN ASPART 100 UNIT/ML ~~LOC~~ SOLN: 0.0000 [IU] | SUBCUTANEOUS | Status: DC

## 2013-02-02 MED ORDER — ENOXAPARIN SODIUM 30 MG/0.3ML ~~LOC~~ SOLN
30.0000 mg | SUBCUTANEOUS | Status: DC
  Administered 2013-02-03: 30 mg via SUBCUTANEOUS
  Filled 2013-02-02 (×3): qty 0.3

## 2013-02-02 MED ORDER — CINACALCET HCL 30 MG PO TABS
60.0000 mg | ORAL_TABLET | Freq: Every day | ORAL | Status: DC
  Administered 2013-02-03: 60 mg via ORAL
  Filled 2013-02-02 (×2): qty 2

## 2013-02-02 MED ORDER — ASPIRIN 325 MG PO TABS
325.0000 mg | ORAL_TABLET | Freq: Every day | ORAL | Status: DC
  Administered 2013-02-03: 325 mg via ORAL
  Filled 2013-02-02: qty 1

## 2013-02-02 MED ORDER — ONDANSETRON HCL 4 MG PO TABS: 4.0000 mg | ORAL_TABLET | Freq: Four times a day (QID) | ORAL | Status: DC | PRN

## 2013-02-02 MED ORDER — INSULIN ASPART 100 UNIT/ML ~~LOC~~ SOLN: 3.0000 [IU] | Freq: Three times a day (TID) | SUBCUTANEOUS | Status: DC

## 2013-02-02 MED ORDER — SODIUM CHLORIDE 0.9 % IJ SOLN
3.0000 mL | Freq: Two times a day (BID) | INTRAMUSCULAR | Status: DC
  Administered 2013-02-03: 3 mL via INTRAVENOUS

## 2013-02-02 MED ORDER — ACETAMINOPHEN 325 MG PO TABS: 650.0000 mg | ORAL_TABLET | Freq: Four times a day (QID) | ORAL | Status: DC | PRN

## 2013-02-02 NOTE — ED Provider Notes (Signed)
  Physical Exam  BP 96/61  Pulse 93  Temp(Src) 99.1 F (37.3 C) (Oral)  Resp 18  SpO2 96%  Physical Exam Patient with worsening.  Diabetic foot ulcer, comes due to increased pain ED Course  Procedures Xrays  reviewed.  No osteo or fractures.  Will admit  patient for IV antibiotics MDM       Arman Filter, NP 02/02/13 2251

## 2013-02-02 NOTE — ED Notes (Signed)
Restricted arm band placed on patient. Right arm graft.

## 2013-02-02 NOTE — H&P (Signed)
[facility] History and Physical  [name] ZOX:[id number] DOB: 03/16/1956 DOA: 02/02/2013  Referring physician: ED physician PCP: Katy Apo, MD   Chief Complaint: Left heal ulcer  HPI:  Patient is 57 year old female with complex and multiple medical problems including uncontrolled diabetes, end-stage renal disease on dialysis, ischemic cardiomyopathy with ejection fraction of 25-30% in 2011 and grade 1 diastolic CHF, severe peripheral vascular disease, who now presents to [facility] emergency department with main concern of progressively worsening pain in the left heel area. Patient explains she has had large left heel ulcer for approximately 6 weeks and is currently seeing Dr. Edilia Bo with vascular surgery for consideration of peripheral bypass. She reports that over the past 24 hours her pain has increased, it is constant, 10 out of 10 in severity, worse with ambulation, no specific alleviating factors. She reports associated foul smell and yellow pus. Patient denies fevers and chills, no specific systemic concerns other than the ones mentioned, no specific abdominal concerns.  In the emergency department, large left heel ulcer present, for by 6 cm in size, yellow pus draining. Blood work significant for elevated lactic acid 3.3, no electrolyte panel available at the time of admission. [facility] asked to admit for further evaluation. Patient started on vancomycin and Zosyn in emergency department  Assessment and Plan: Left heel ulcer - With findings of yellow pus, foul smell, elevated lactic acid and procalcitonin level, I agree with continuing broad-spectrum antibiotics vancomycin and Zosyn - Will ask morning team to reconsult vascular surgery if they think appropriate - Wound care consult Diabetes type 2, uncontrolled with complications of neuropathy - We'll continue home medication regimen, will add sliding scale insulin Leukocytosis and tachycardia - Likely secondary to left  heel ulcer infection - Antibiotics as noted above, repeat lactic acid End-stage renal disease - Patient follows with Dr Darrick Penna, had hemodialysis this morning, dialyzes Monday Wednesday and Friday - Patient wants to leave the hospital in the morning, if she stays longer please call nephrology team for continuation of hemodialysis Ischemic cardiomyopathy - I don't see recent 2-D echo in Epic system, will ask primary team to evaluate if repeat 2-D echo is needed - The last ejection fraction 25% with grade 1 diastolic CHF, this appears to be clinically stable at this time  Code Status: Full Family Communication: Pt at bedside Disposition Plan: Admit to telemetry unit   Review of Systems:  Constitutional: Negative for fever, chills and malaise/fatigue. Negative for diaphoresis.  HENT: Negative for hearing loss, ear pain, nosebleeds, congestion, sore throat, neck pain, tinnitus and ear discharge.   Eyes: Negative for blurred vision, double vision, photophobia, pain, discharge and redness.  Respiratory: Negative for cough, hemoptysis, sputum production, shortness of breath, wheezing and stridor.   Cardiovascular: Negative for chest pain, palpitations, orthopnea Gastrointestinal: Negative for nausea, vomiting and abdominal pain. Negative for heartburn, constipation, blood in stool and melena.  Genitourinary: Negative for dysuria, urgency, frequency, hematuria and flank pain.  Musculoskeletal: Negative for myalgias, back pain, joint pain and falls.  Skin: Negative for itching and rash.  Neurological: Negative for tingling, tremors, speech change, focal weakness, loss of consciousness and headaches.  Endo/Heme/Allergies: Negative for environmental allergies and polydipsia. Does not bruise/bleed easily.  Psychiatric/Behavioral: Negative for suicidal ideas. The patient is not nervous/anxious.      Past Medical History  Diagnosis Date  . Diabetes mellitus, type 2   . Kidney disease     ESRD  secondary to DM, failed transplant 2006. Dialysis  . Secondary  hyperparathyroidism   . OSA (obstructive sleep apnea)     NPSG 11.23.11-AHI 64.9/hr-central and obstructive apnea  . Ischemic cardiomyopathy     EF 25-30% 2011  . CAD (coronary artery disease)   . Pulmonary hypertension     Est PAsyst 60-Dr Mendel Ryder, off coumadin, CT neg for PE  . Hyperlipidemia   . Hypertension   . Anemia   . Thyroid disease   . Pulmonary edema   . Peripheral vascular disease   . Sebaceous cyst   . 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 Velvet Bathe    Past Surgical History  Procedure Laterality Date  . Coronary artery bypass graft  2011    3V  . Insertion of dialysis catheter    . 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 Nasal mucosal biopsy    . Ovarian cyst surgery    . 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 Laser retinal surgery  2011  . Kidney transplant      rejection  . Abdominal angiogram Left 01/25/13    Social History:  reports that she has never smoked. She has never used smokeless tobacco. She reports that she does not drink alcohol or use illicit drugs.  Allergies  Allergen Reactions  . Neurontin (Gabapentin) Anaphylaxis  . Ezetimibe-Simvastatin Other (See Comments)    REACTION: face edema; itching  . Klonopin (Clonazepam) Other (See Comments)    hallucination  . Sertraline Hcl Other (See Comments)    unknown    Family History  Problem Relation Age of Onset  . Heart disease    . Edema Father     fluid overload  . Cerebral aneurysm Mother   . Stroke Brother   . Heart disease Brother     Prior to Admission medications   Medication Sig Start Date End Date Taking? Authorizing Provider  acetaminophen (TYLENOL) 325 MG tablet Take 650 mg by mouth every 6 (six) hours as needed for pain.    Yes Historical Provider, MD  aspirin 325 MG tablet Take 325 mg by mouth daily.     Yes Historical Provider, MD  atorvastatin (LIPITOR)  10 MG tablet Take 10 mg by mouth daily before breakfast.    Yes Historical Provider, MD  B Complex-C (B-COMPLEX WITH VITAMIN C) tablet Take 1 tablet by mouth daily.   Yes Historical Provider, MD  B Complex-C-Folic Acid (DIALYVITE 800 PO) Take 1 tablet by mouth daily.     Yes Historical Provider, MD  cinacalcet (SENSIPAR) 60 MG tablet Take 60 mg by mouth daily before breakfast.    Yes Historical Provider, MD  collagenase (SANTYL) ointment Apply 1 application topically every other day.   Yes Historical Provider, MD  ibuprofen (ADVIL,MOTRIN) 600 MG tablet Take 300 mg by mouth every 6 (six) hours as needed for pain.  01/20/13  Yes Historical Provider, MD  insulin aspart (NOVOLOG) 100 UNIT/ML injection Inject 3-16 Units into the skin 3 (three) times daily before meals. Sliding scale   Yes Historical Provider, MD  insulin glargine (LANTUS) 100 UNIT/ML injection Inject 12 Units into the skin at bedtime.    Yes Historical Provider, MD  midodrine (PROAMATINE) 10 MG tablet Take 10 mg by mouth 3 (three) times a week. Take on Monday, Wednesday, and Friday   Yes Historical Provider, MD  oxyCODONE (OXY IR/ROXICODONE) 5 MG immediate release tablet Take 1 tablet (5 mg total) by mouth every 8 (eight) hours as needed for  pain. 01/31/13  Yes Chuck Hint, MD  pregabalin (LYRICA) 50 MG capsule Take 50 mg by mouth at bedtime.   Yes Historical Provider, MD  Propylene Glycol (SYSTANE BALANCE OP) Place 1 drop into both eyes as needed (dry eyes). Dry eyes   Yes Historical Provider, MD  sevelamer (RENVELA) 800 MG tablet Take 2,400 mg by mouth 3 (three) times daily with meals. 3 tabs by mouth three times a day   Yes Historical Provider, MD  tetrahydrozoline-zinc (VISINE-AC) 0.05-0.25 % ophthalmic solution Place 2 drops into both eyes 3 (three) times daily as needed (for redness).   Yes Historical Provider, MD    Physical Exam: Filed Vitals:   02/02/13 1659 02/02/13 2138  BP: 113/63 96/61  Pulse: 102 93  Temp: 98.8  F (37.1 C) 99.1 F (37.3 C)  TempSrc: Oral   Resp: 20 18  SpO2: 93% 96%    Physical Exam  Constitutional: Appears well-developed and well-nourished. No distress.  HENT: Normocephalic. External right and left ear normal. Oropharynx is clear and moist.  Eyes: Conjunctivae and EOM are normal. PERRLA, no scleral icterus.  Neck: Normal ROM. Neck supple. No JVD. No tracheal deviation. No thyromegaly.  CVS: Regular rhythm, tachycardic S1/S2 +, no murmurs, no gallops, no carotid bruit.  Pulmonary: Effort and breath sounds normal, no stridor, rhonchi, bibasilar crackles think it Abdominal: Soft. BS +,  no distension, tenderness, rebound or guarding.  Musculoskeletal: Normal range of motion.  Lymphadenopathy: No lymphadenopathy noted, cervical, inguinal. Neuro: Alert. Nonfocal. Skin: Large left heel ulcer 4 x 6 cm in size, no evident bone, yellow pus draining, no tenderness to palpation, mild surrounding erythema noted  Psychiatric: Normal mood and affect. Behavior, judgment, thought content normal.   Labs on Admission:  Basic Metabolic Panel: No results found for this basename: NA, K, CL, CO2, GLUCOSE, BUN, CREATININE, CALCIUM, MG, PHOS,  in the last 168 hours Liver Function Tests: No results found for this basename: AST, ALT, ALKPHOS, BILITOT, PROT, ALBUMIN,  in the last 168 hours No results found for this basename: LIPASE, AMYLASE,  in the last 168 hours No results found for this basename: AMMONIA,  in the last 168 hours  CBC:  Recent Labs Lab 02/02/13 1720  WBC 11.0*  HGB 14.9  HCT 42.7  MCV 86.3  PLT 263   CBG:  Recent Labs Lab 02/02/13 2225  GLUCAP 146*    Radiological Exams on Admission: Dg Chest 2 View  02/02/2013   *RADIOLOGY REPORT*  Clinical Data: Fever.  Open wound on the medial aspect of the left heel, likely a diabetic ulcer.  CHEST - 2 VIEW  Comparison: One-view chest x-ray 07/25/2012.  Two-view chest x-ray 08/11/2010.  Findings: Prior sternotomy CABG.   Cardiac silhouette mildly enlarged but stable.  Thoracic aorta atherosclerotic, unchanged. Hilar and mediastinal contours otherwise unremarkable.  Suboptimal inspiration accounts for crowded bronchovascular markings at the lung bases.  Lungs otherwise clear.  Pulmonary vascularity normal. Bronchovascular markings normal.  No pleural effusions.  No pneumothorax.  Degenerative changes involving the thoracic spine.  IMPRESSION: Stable cardiomegaly.  No acute cardiopulmonary disease.   Original Report Authenticated By: Hulan Saas, M.D.   Dg Foot 2 Views Left  02/02/2013   *RADIOLOGY REPORT*  Clinical Data: Open wound at the medial aspect of the left heel; history of diabetes.  LEFT FOOT - 2 VIEW  Comparison: None.  Findings: There is no evidence of fracture or dislocation.  There is no evidence of osseous erosion to suggest osteomyelitis.  The joint spaces are preserved.  There is no evidence of talar subluxation; the subtalar joint is unremarkable in appearance.  A soft tissue ulceration is noted overlying the posterior calcaneus on the lateral view.  No underlying foreign bodies are seen. Diffuse vascular calcifications are noted within the soft tissues.  IMPRESSION:  1.  No evidence of fracture or dislocation; no evidence of osseous erosion to suggest osteomyelitis. 2.  Soft tissue ulceration noted overlying the posterior calcaneus on the lateral view; diffuse vascular calcifications seen.   Original Report Authenticated By: Tonia Ghent, M.D.    EKG: Normal sinus rhythm, no ST/T wave changes  Debbora Presto, MD  [facility] Pager [phone]  If 7PM-7AM, please contact night-coverage [email or web address] Password [facility] 02/02/2013, 11:35 PM

## 2013-02-02 NOTE — ED Notes (Signed)
Pt wanting to go home.

## 2013-02-02 NOTE — ED Provider Notes (Signed)
MSE was initiated and I personally evaluated the patient and placed orders (if any) at  7:34 PM on Feb 02, 2013.  The patient appears stable so that the remainder of the MSE may be completed by another provider.  57 y.o. Female with renal failure, dialysis, mwf, diabetes presents with one month history of left heel ulcer now with nausea and chills last night.  Patient seen by podiatrist and wound care.  Her pmd and dialysis physician is Dr. Darrick Penna.    PE Filed Vitals:   02/02/13 1659  BP: 113/63  Pulse: 102  Temp: 98.8 F (37.1 C)  Resp: 20   Left heel     Results for orders placed during the hospital encounter of 02/02/13  CBC      Result Value Range   WBC 11.0 (*) 4.0 - 10.5 K/uL   RBC 4.95  3.87 - 5.11 MIL/uL   Hemoglobin 14.9  12.0 - 15.0 g/dL   HCT 16.1  09.6 - 04.5 %   MCV 86.3  78.0 - 100.0 fL   MCH 30.1  26.0 - 34.0 pg   MCHC 34.9  30.0 - 36.0 g/dL   RDW 40.9 (*) 81.1 - 91.4 %   Platelets 263  150 - 400 K/uL  CG4 I-STAT (LACTIC ACID)      Result Value Range   Lactic Acid, Venous 3.23 (*) 0.5 - 2.2 mmol/L  Patient with elevated lactic acid and heel wound with known diabetes.  Plan iv abx and admission.   Hilario Quarry, MD 02/02/13 [phone]

## 2013-02-02 NOTE — ED Provider Notes (Signed)
History     CSN: [id number]  Arrival date & time 02/02/13  1645   First MD Initiated Contact with Patient 02/02/13 1924      Chief Complaint  Patient presents with  . Fever  . Wound Infection    (Consider location/radiation/quality/duration/timing/severity/associated sxs/prior treatment) HPI 57 year old female with a history of diabetes and left heel ulcer who presents today with worsening pain in her left heel and nausea and chills. The ulcer has been present for least 4 weeks. She has noticed some worsening of the symptoms in the past 24 hours. She's been seen by podiatry and vascular surgery. She is scheduled for a peripheral bypass in 2 weeks. She has not noted fever. She had dialysis this a.m. Dr. Rosanne Ashing Deterding is her nephrologist. She states she was on antibiotics in the past but has not been on them for 2-3 weeks. Review of systems positive for left heel pain positive for chills positive for nausea negative for headache, chest pain, cough, dyspnea, urinary symptoms, diarrhea. Past Medical History  Diagnosis Date  . Diabetes mellitus, type 2   . Kidney disease     ESRD secondary to DM, failed transplant 2006. Dialysis  . Secondary hyperparathyroidism   . OSA (obstructive sleep apnea)     NPSG 11.23.11-AHI 64.9/hr-central and obstructive apnea  . Ischemic cardiomyopathy     EF 25-30% 2011  . CAD (coronary artery disease)   . Pulmonary hypertension     Est PAsyst 60-Dr Mendel Ryder, off coumadin, CT neg for PE  . Hyperlipidemia   . Hypertension   . Anemia   . Thyroid disease   . Pulmonary edema   . Peripheral vascular disease   . Sebaceous cyst   . 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 Velvet Bathe    Past Surgical History  Procedure Laterality Date  . Coronary artery bypass graft  2011    3V  . Insertion of dialysis catheter    . 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 Nasal mucosal biopsy    . Ovarian cyst surgery    . Removal of failed  cadaveric renal transplant 2006-wound infection    . Laser retinal surgery  2011  . Kidney transplant      rejection  . Abdominal angiogram Left 01/25/13    Family History  Problem Relation Age of Onset  . Heart disease    . Edema Father     fluid overload  . Cerebral aneurysm Mother   . Stroke Brother   . Heart disease Brother     History  Substance Use Topics  . Smoking status: Never Smoker   . Smokeless tobacco: Never Used  . Alcohol Use: No    OB History   Grav Para Term Preterm Abortions TAB SAB Ect Mult Living                  Review of Systems  All other systems reviewed and are negative.    Allergies  Neurontin; Ezetimibe-simvastatin; Klonopin; and Sertraline hcl  Home Medications   Current Outpatient Rx  Name  Route  Sig  Dispense  Refill  . acetaminophen (TYLENOL) 325 MG tablet   Oral   Take 650 mg by mouth every 6 (six) hours as needed for pain.          [name] aspirin 325 MG tablet   Oral   Take 325 mg by mouth daily.           [name]  atorvastatin (LIPITOR) 10 MG tablet   Oral   Take 10 mg by mouth daily before breakfast.          . B Complex-C (B-COMPLEX WITH VITAMIN C) tablet   Oral   Take 1 tablet by mouth daily.         . B Complex-C-Folic Acid (DIALYVITE 800 PO)   Oral   Take 1 tablet by mouth daily.           . cinacalcet (SENSIPAR) 60 MG tablet   Oral   Take 60 mg by mouth daily before breakfast.          . collagenase (SANTYL) ointment   Topical   Apply 1 application topically every other day.         . ibuprofen (ADVIL,MOTRIN) 600 MG tablet   Oral   Take 300 mg by mouth every 6 (six) hours as needed for pain.          [name] insulin aspart (NOVOLOG) 100 UNIT/ML injection   Subcutaneous   Inject 3-16 Units into the skin 3 (three) times daily before meals. Sliding scale         . insulin glargine (LANTUS) 100 UNIT/ML injection   Subcutaneous   Inject 12 Units into the skin at bedtime.          . midodrine  (PROAMATINE) 10 MG tablet   Oral   Take 10 mg by mouth 3 (three) times a week. Take on Monday, Wednesday, and Friday         . oxyCODONE (OXY IR/ROXICODONE) 5 MG immediate release tablet   Oral   Take 1 tablet (5 mg total) by mouth every 8 (eight) hours as needed for pain.   30 tablet   0   . pregabalin (LYRICA) 50 MG capsule   Oral   Take 50 mg by mouth at bedtime.         [name] Propylene Glycol (SYSTANE BALANCE OP)   Both Eyes   Place 1 drop into both eyes as needed (dry eyes). Dry eyes         . sevelamer (RENVELA) 800 MG tablet   Oral   Take 2,400 mg by mouth 3 (three) times daily with meals. 3 tabs by mouth three times a day         . tetrahydrozoline-zinc (VISINE-AC) 0.05-0.25 % ophthalmic solution   Both Eyes   Place 2 drops into both eyes 3 (three) times daily as needed (for redness).           BP 113/63  Pulse 102  Temp(Src) 98.8 F (37.1 C) (Oral)  Resp 20  SpO2 93%  Physical Exam  Nursing note and vitals reviewed. Constitutional: She is oriented to person, place, and time. She appears well-developed and well-nourished.  HENT:  Head: Normocephalic and atraumatic.  Right Ear: External ear normal.  Left Ear: External ear normal.  Nose: Nose normal.  Mouth/Throat: Oropharynx is clear and moist.  Eyes: Conjunctivae and EOM are normal. Pupils are equal, round, and reactive to light.  Neck: Normal range of motion. Neck supple.  Cardiovascular: Normal rate, regular rhythm and normal heart sounds.   Pulmonary/Chest: Effort normal and breath sounds normal.  Abdominal: Soft.  Musculoskeletal: Normal range of motion.  Right bka, left foot with bilateral ulcers on heel, medial ulcer 6 x6 cm with surrounding erythema and warmth with some proximal calf ttp and indureation.   Neurological: She is alert and oriented to person, place, and time.  Skin: Skin is warm and dry.  Psychiatric: She has a normal mood and affect. Her behavior is normal. Judgment and thought  content normal.    ED Course  Procedures (including critical care time)  Labs Reviewed  CBC - Abnormal; Notable for the following:    WBC 11.0 (*)    RDW 16.0 (*)    All other components within normal limits  CG4 I-STAT (LACTIC ACID) - Abnormal; Notable for the following:    Lactic Acid, Venous 3.23 (*)    All other components within normal limits  CULTURE, BLOOD (ROUTINE X 2)  CULTURE, BLOOD (ROUTINE X 2)  URINE CULTURE  PROCALCITONIN  URINALYSIS, ROUTINE W REFLEX MICROSCOPIC   No results found.   No diagnosis found.    MDM  Patient has chemistry pending. Her first lactic acid is elevated at 3.3. She will not receive IV fluids as she is not tachycardic or hypertensive and is a dialysis patient. She'll be started on Zosyn and vancomycin. Once x-rays are obtained she'll be admitted to the hospital for further treatment the     Hilario Quarry, MD 02/02/13 1956

## 2013-02-02 NOTE — ED Notes (Signed)
Pt reports having wound to left ankle/heel for extended amount of time, goes to pcp and foot dr for it. Started having chills and nausea last night and sent here for eval. Pt has foot bandaged pta.

## 2013-02-02 NOTE — Progress Notes (Signed)
ANTIBIOTIC CONSULT NOTE - INITIAL  Pharmacy Consult for Vancomycin/Zosyn Indication: L-foot infection, possible sepsis  Allergies  Allergen Reactions  . Neurontin (Gabapentin) Anaphylaxis  . Ezetimibe-Simvastatin Other (See Comments)    REACTION: face edema; itching  . Klonopin (Clonazepam) Other (See Comments)    hallucination  . Sertraline Hcl Other (See Comments)    unknown    Patient Measurements:   Wt: 95.3 kg  Vital Signs: Temp: 98.8 F (37.1 C) (05/23 1659) Temp src: Oral (05/23 1659) BP: 113/63 mmHg (05/23 1659) Pulse Rate: 102 (05/23 1659) Intake/Output from previous day:   Intake/Output from this shift:    Labs:  Recent Labs  02/02/13 1720  WBC 11.0*  HGB 14.9  PLT 263   The CrCl is unknown because both a height and weight (above a minimum accepted value) are required for this calculation. No results found for this basename: VANCOTROUGH, VANCOPEAK, VANCORANDOM, GENTTROUGH, GENTPEAK, GENTRANDOM, TOBRATROUGH, TOBRAPEAK, TOBRARND, AMIKACINPEAK, AMIKACINTROU, AMIKACIN,  in the last 72 hours   Microbiology: No results found for this or any previous visit (from the past 720 hour(s)).  Medical History: Past Medical History  Diagnosis Date  . Diabetes mellitus, type 2   . Kidney disease     ESRD secondary to DM, failed transplant 2006. Dialysis  . Secondary hyperparathyroidism   . OSA (obstructive sleep apnea)     NPSG 11.23.11-AHI 64.9/hr-central and obstructive apnea  . Ischemic cardiomyopathy     EF 25-30% 2011  . CAD (coronary artery disease)   . Pulmonary hypertension     Est PAsyst 60-Dr Mendel Ryder, off coumadin, CT neg for PE  . Hyperlipidemia   . Hypertension   . Anemia   . Thyroid disease   . Pulmonary edema   . Peripheral vascular disease   . Sebaceous cyst   . 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 Velvet Bathe    Assessment: 57 y/o F with left heel ulcer x 1 month who presents with nausea and chills. MWF HD patient who tolerated a full  session of HD today. Pt states no current antibiotics at HD. WBC 11, Lactic Acid 3.23, Tmax 98.8.  5/23 Blood>> 5/23 Urine>>  Goal of Therapy:  Pre-HD Vancomycin level 15-25 mg/L  Plan:  -Vancomycin 2000 mg IV x 1 -Pharmacy to f/u HD schedule for Vancomycin 1000 mg IV MWF post-HD -Start Zosyn 2.25 g IV q8h -Trend WBC, temp, cultures -F/U length of treatment  Abran Duke, PharmD Clinical Pharmacist Phone: [phone] Pager: [phone] 02/02/2013 8:10 PM

## 2013-02-03 DIAGNOSIS — N186 End stage renal disease: Secondary | ICD-10-CM

## 2013-02-03 DIAGNOSIS — E1351 Other specified diabetes mellitus with diabetic peripheral angiopathy without gangrene: Secondary | ICD-10-CM | POA: Diagnosis present

## 2013-02-03 DIAGNOSIS — L98499 Non-pressure chronic ulcer of skin of other sites with unspecified severity: Secondary | ICD-10-CM

## 2013-02-03 DIAGNOSIS — I739 Peripheral vascular disease, unspecified: Secondary | ICD-10-CM

## 2013-02-03 DIAGNOSIS — I255 Ischemic cardiomyopathy: Secondary | ICD-10-CM | POA: Diagnosis present

## 2013-02-03 LAB — CBC
HCT: 42.1 % (ref 36.0–46.0)
HCT: 45.8 % (ref 36.0–46.0)
Hemoglobin: 13.9 g/dL (ref 12.0–15.0)
Hemoglobin: 14.9 g/dL (ref 12.0–15.0)
MCHC: 33 g/dL (ref 30.0–36.0)
MCV: 88.2 fL (ref 78.0–100.0)
RBC: 5.19 MIL/uL — ABNORMAL HIGH (ref 3.87–5.11)
RDW: 15.6 % — ABNORMAL HIGH (ref 11.5–15.5)
WBC: 11.8 10*3/uL — ABNORMAL HIGH (ref 4.0–10.5)
WBC: 12.2 10*3/uL — ABNORMAL HIGH (ref 4.0–10.5)

## 2013-02-03 LAB — GLUCOSE, CAPILLARY
Glucose-Capillary: 131 mg/dL — ABNORMAL HIGH (ref 70–99)
Glucose-Capillary: 97 mg/dL (ref 70–99)

## 2013-02-03 LAB — BASIC METABOLIC PANEL
BUN: 14 mg/dL (ref 6–23)
BUN: 17 mg/dL (ref 6–23)
CO2: 24 mEq/L (ref 19–32)
Chloride: 94 mEq/L — ABNORMAL LOW (ref 96–112)
Chloride: 94 mEq/L — ABNORMAL LOW (ref 96–112)
Creatinine, Ser: 4.27 mg/dL — ABNORMAL HIGH (ref 0.50–1.10)
GFR calc Af Amer: 10 mL/min — ABNORMAL LOW (ref >90)
GFR calc Af Amer: 12 mL/min — ABNORMAL LOW (ref >90)
GFR calc non Af Amer: 9 mL/min — ABNORMAL LOW (ref >90)
Glucose, Bld: 133 mg/dL — ABNORMAL HIGH (ref 70–99)
Potassium: 3.6 mEq/L (ref 3.5–5.1)
Potassium: 4.1 mEq/L (ref 3.5–5.1)
Sodium: 135 mEq/L (ref 135–145)

## 2013-02-03 LAB — MRSA PCR SCREENING: MRSA by PCR: NEGATIVE

## 2013-02-03 NOTE — Progress Notes (Signed)
Tele was d/c,IV was d/c.discharge orders and follow up appointments were given to pt.pt. Ready to go home.

## 2013-02-03 NOTE — Progress Notes (Signed)
New Admission Note:   Arrival Method: Ed stretcher with RN  Mental Orientation: Alert and oriented x4 Telemetry: Patient placed on box 6708  Assessment: Completed and charted in doc flowsheet Skin: Right AKA, and left heel diabetic ulcers IV: IV ABT running  Pain: No complaints of pain Tubes: N/A Safety Measures: Fall prevention safety sheet given, one red sock on left leg and yellow arm band placed on patient Admission: Completed. New arm band given.  6700 Orientation: Completed. Oriented her to the unit, room and the staff.  Family: No family present at this time  Orders have been reviewed and implemented. Will continue to monitor the patient. Call light has been placed within reach and bed alarm has been activated.   Doristine Devoid, RN  Phone number: [phone]

## 2013-02-03 NOTE — Progress Notes (Signed)
Assessment/Plan: Principal Problem:   Atherosclerosis of native arteries of the extremities with ulceration(440.23) - now with apparent worsening infection based on description of odor and description by admitting physician. I did not look at wound myself this morning. WBC is marginally better. I will ask vascular to see today and give opinion about continuing hospitalization and abx and possible moving up of revascularization. I told patient that I was sorry that she may miss event at church, but I could not d/c her without their opinion.  Active Problems:   DIAB W/RENAL MANIFESTS TYPE II/UNS NOT UNCNTRL - check A1c.    HYPERLIPIDEMIA   OBSTRUCTIVE SLEEP APNEA   HYPERTENSION   End stage renal disease - if patient remains in hosp, will call renal as she will need HD on Monday.    Ischemic cardiomyopathy - no symptoms of CHF   Subjective: Patient very disappointed and frustrated that she is in hospital. She states that she called foot doctor yesterday when she noted a stench from her foot. She was told to go to ED. She called Dr. Adele Dan office who, she claims, told her that someone from [facility] would see here in ED. She was then admitted when infection noted and WBC was up. She states that Dr. Edilia Bo was aware that there was an ulcer. Revascularization is pending 6/10. 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   She states she has not seen Dr. Nehemiah Settle in at least four years. She states that Dr. Darrick Penna manages her diabetes.   Some pain this morning but less than yesterday.   Objective:  Vital Signs: Filed Vitals:   02/03/13 0042 02/03/13 0044 02/03/13 0121 02/03/13 0407  BP:   120/69 111/71  Pulse: 108 102 100 101  Temp:   99.1 F (37.3 C) 99.1 F (37.3 C)  TempSrc:   Oral Oral  Resp: 22 23 24 21   Height:   5\' 9"  (1.753 m)   Weight:   94.6 kg (208 lb 8.9 oz)   SpO2: 100% 99% 99% 97%     EXAM:  left foot is dressed; right BKA apparent.   No intake or output data in the 24 hours ending 02/03/13 0734  Lab Results:  Recent Labs  02/02/13 2341 02/03/13 0525  NA 134* 135  K 4.1 3.6  CL 94* 94*  CO2 24 29  GLUCOSE 133* 114*  BUN 14 17  CREATININE 4.27* 4.85*  CALCIUM 8.6 8.7   No results found for this basename: AST, ALT, ALKPHOS, BILITOT, PROT, ALBUMIN,  in the last 72 hours No results found for this basename: LIPASE, AMYLASE,  in the last 72 hours  Recent Labs  02/02/13 2341 02/03/13 0525  WBC 11.8* 12.2*  HGB 14.9 13.9  HCT 45.8 42.1  MCV 88.2 87.5  PLT 112* 104*   No results found for this basename: CKTOTAL, CKMB, CKMBINDEX, TROPONINI,  in the last 72 hours No components found with this basename: POCBNP,  No results found for this basename: DDIMER,  in the last 72 hours No results found for this basename: HGBA1C,  in the last 72 hours No results found for this basename: CHOL, HDL, LDLCALC, TRIG, CHOLHDL, LDLDIRECT,  in the last 72 hours No results found for this basename: TSH, T4TOTAL, FREET3, T3FREE, THYROIDAB,  in the last 72 hours No results found for this basename: VITAMINB12, FOLATE, FERRITIN, TIBC, IRON, RETICCTPCT,  in the last 72 hours  Studies/Results: Dg Chest 2 View  02/02/2013   *RADIOLOGY REPORT*  Clinical Data: Fever.  Open wound on the medial aspect of the left heel, likely a diabetic ulcer.  CHEST - 2 VIEW  Comparison: One-view chest x-ray 07/25/2012.  Two-view chest x-ray 08/11/2010.  Findings: Prior sternotomy CABG.  Cardiac silhouette mildly enlarged but stable.  Thoracic aorta atherosclerotic, unchanged. Hilar and mediastinal contours otherwise unremarkable.  Suboptimal inspiration accounts for crowded bronchovascular markings at the lung bases.  Lungs otherwise clear.  Pulmonary vascularity normal. Bronchovascular markings normal.  No pleural effusions.  No pneumothorax.  Degenerative changes involving the thoracic spine.  IMPRESSION: Stable  cardiomegaly.  No acute cardiopulmonary disease.   Original Report Authenticated By: Hulan Saas, M.D.   Dg Foot 2 Views Left  02/02/2013   *RADIOLOGY REPORT*  Clinical Data: Open wound at the medial aspect of the left heel; history of diabetes.  LEFT FOOT - 2 VIEW  Comparison: None.  Findings: There is no evidence of fracture or dislocation.  There is no evidence of osseous erosion to suggest osteomyelitis.  The joint spaces are preserved.  There is no evidence of talar subluxation; the subtalar joint is unremarkable in appearance.  A soft tissue ulceration is noted overlying the posterior calcaneus on the lateral view.  No underlying foreign bodies are seen. Diffuse vascular calcifications are noted within the soft tissues.  IMPRESSION:  1.  No evidence of fracture or dislocation; no evidence of osseous erosion to suggest osteomyelitis. 2.  Soft tissue ulceration noted overlying the posterior calcaneus on the lateral view; diffuse vascular calcifications seen.   Original Report Authenticated By: Tonia Ghent, M.D.   Medications: Medications administered in the last 24 hours reviewed.  Current Medication List reviewed.    LOS: 1 day   [facility] @ Patsi Sears [phone]) 02/03/2013, 7:34 AM

## 2013-02-03 NOTE — ED Notes (Signed)
Dry Sterile Dressing applied to left heel wound.

## 2013-02-03 NOTE — Discharge Summary (Signed)
Physician Discharge Summary  NAME:[name]  RUE:[id number]  DOB: 1956/08/31   Admit date: 02/02/2013 Discharge date: 02/03/2013  Admitting Diagnosis: diabetic foot ulcer  Discharge Diagnoses:  Active Hospital Problems   Diagnosis Date Noted  . Atherosclerosis of native arteries of the extremities with ulceration(440.23) 01/17/2013  . Cardiomyopathy, ischemic 02/03/2013  . DM (diabetes mellitus), secondary, w/peripheral vascular complications 02/03/2013  . HYPERLIPIDEMIA 07/28/2010  . HYPERTENSION 07/28/2010  . DIAB W/RENAL MANIFESTS TYPE II/UNS NOT UNCNTRL 07/27/2010  . OBSTRUCTIVE SLEEP APNEA 07/27/2010  . End stage renal disease 07/27/2010    Resolved Hospital Problems   Diagnosis Date Noted Date Resolved  No resolved problems to display.    Discharge Condition: improved  Hospital Course: Patient was admitted after noting some foul odor from her foot ulcer. She was started on abx. She was seen by Dr. Myra Gianotti and it was decided that she did not need ongoing IV abx but she could be d/c'ed and receive abx after dialysis. She had a previous surgery date of 5/10 but that will be moved up.   Consults: Vascular surgery  Disposition: 01-Home or Self Care  Discharge Orders   Future Appointments Provider Department Dept Phone   02/12/2013 12:40 PM [facility] [place] [facility] [place]  [facility] [phone]   Patient should wear two piece clothing and wear no powder or deodorant. Patient should arrive 15 minutes early.   02/12/2013 1:20 PM [facility] [place] [facility] [place]  IMAGING [phone]   Patient should wear two piece clothing and wear no powder or deodorant. Patient should arrive 15 minutes early.   05/07/2013 12:45 PM Del[facility] Roch [facility] [phone]   05/17/2013 2:00 PM Samul Dada, MD [facility] MEDICAL ONCOLOGY [phone]   05/29/2013 10:00 AM Waymon Budge, MD [facility] [phone]    10/10/2013 1:00 PM [facility] [phone]   10/10/2013 1:30 PM Chuck Hint, MD [facility] [phone]   Future Orders Complete By Expires     Activity as tolerated - No restrictions  As directed     Diet - low sodium heart healthy  As directed     Discharge instructions  As directed     Comments:      Resume dialysis as usual. We will contact renal doctors about administering some antibiotics after each dialysis.        Medication List    STOP taking these medications       DIALYVITE 800 PO      TAKE these medications       acetaminophen 325 MG tablet  Commonly known as:  TYLENOL  Take 650 mg by mouth every 6 (six) hours as needed for pain.     aspirin 325 MG tablet  Take 325 mg by mouth daily.     atorvastatin 10 MG tablet  Commonly known as:  LIPITOR  Take 10 mg by mouth daily before breakfast.     B-complex with vitamin C tablet  Take 1 tablet by mouth daily.     cinacalcet 60 MG tablet  Commonly known as:  SENSIPAR  Take 60 mg by mouth daily before breakfast.     collagenase ointment  Commonly known as:  SANTYL  Apply 1 application topically every other day.     ibuprofen 600 MG tablet  Commonly known as:  ADVIL,MOTRIN  Take 300 mg by mouth every 6 (  six) hours as needed for pain.     insulin aspart 100 UNIT/ML injection  Commonly known as:  novoLOG  Inject 3-16 Units into the skin 3 (three) times daily before meals. Sliding scale     insulin glargine 100 UNIT/ML injection  Commonly known as:  LANTUS  Inject 12 Units into the skin at bedtime.     LYRICA 50 MG capsule  Generic drug:  pregabalin  Take 50 mg by mouth at bedtime.     midodrine 10 MG tablet  Commonly known as:  PROAMATINE  Take 10 mg by mouth 3 (three) times a week. Take on Monday, Wednesday, and Friday     oxyCODONE 5 MG immediate release tablet  Commonly known as:  Oxy IR/ROXICODONE  Take 1 tablet  (5 mg total) by mouth every 8 (eight) hours as needed for pain.     sevelamer carbonate 800 MG tablet  Commonly known as:  RENVELA  Take 2,400 mg by mouth 3 (three) times daily with meals. 3 tabs by mouth three times a day     SYSTANE BALANCE OP  Place 1 drop into both eyes as needed (dry eyes). Dry eyes     tetrahydrozoline-zinc 0.05-0.25 % ophthalmic solution  Commonly known as:  VISINE-AC  Place 2 drops into both eyes 3 (three) times daily as needed (for redness).         Things to follow up in the outpatient setting: abx after dialysis. Be sure she has appt in vascular.  Time coordinating discharge: 25 minutes including medication reconciliation, transmission of prescriptions to pharmacy, preparation of discharge papers, and discussion with patient     Signed: Darnelle Bos 02/03/2013, 3:05 PM

## 2013-02-03 NOTE — Consult Note (Signed)
[facility] [place]      Consult Note  Patient name: [name] MRN: [id number] DOB: Sep 02, 1956 Sex: female  Consulting Physician:  Hospitalists  Reason for Consult:  Chief Complaint  Patient presents with  . Fever  . Wound Infection    HISTORY OF PRESENT ILLNESS: This is a 57 year old female well known to our practice. She has previously undergone right below-knee amputation. She developed a nonhealing wound on her left heel recently. She underwent angiography which revealed severe superficial femoral and popliteal artery stenosis. Her dominant runoff was anterior tibial artery. She was scheduled on June 10 to undergo a left femoral below-knee popliteal bypass graft for limb salvage. To date was selected secondary to patient preference as well as the need for cardiac clearance given the patient's cardiac history. She contacted her podiatrist yesterday secondary to a foul odor from her foot. She was told to go to the emergency department. While there she was noted to have an elevated white blood cell count. She was admitted for IV antibiotics.  The patient suffers from end-stage renal disease and is on hemodialysis. She suffers from hypercholesterolemia and is on a statin. She is also medically managed for her diabetes as well as her hypertension. She has ischemic cardiomyopathy. Accordingly the patient, her cardiologist as soon information to our office stating that she is moderate risk for surgery but because of her limb threatening nature of her wound she can proceed with surgery.  Past Medical History  Diagnosis Date  . Diabetes mellitus, type 2   . Kidney disease     ESRD secondary to DM, failed transplant 2006. Dialysis  . Secondary hyperparathyroidism   . OSA (obstructive sleep apnea)     NPSG 11.23.11-AHI 64.9/hr-central and obstructive apnea  . Ischemic cardiomyopathy     EF 25-30% 2011  . CAD (coronary artery disease)   . Pulmonary hypertension    Est PAsyst 60-Dr Mendel Ryder, off coumadin, CT neg for PE  . Hyperlipidemia   . Hypertension   . Anemia   . Thyroid disease   . Pulmonary edema   . Peripheral vascular disease   . Sebaceous cyst   . 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 Velvet Bathe    Past Surgical History  Procedure Laterality Date  . Coronary artery bypass graft  2011    3V  . Insertion of dialysis catheter    . 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 Nasal mucosal biopsy    . Ovarian cyst surgery    . 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 Laser retinal surgery  2011  . Kidney transplant      rejection  . Abdominal angiogram Left 01/25/13    History   Social History  . Marital Status: Single    Spouse Name: N/A    Number of Children: N/A  . Years of Education: N/A   Occupational History  . Not on file.   Social History Main Topics  . Smoking status: Never Smoker   . Smokeless tobacco: Never Used  . Alcohol Use: No  . Drug Use: No  . Sexually Active: Not on file   Other Topics Concern  . Not on file   Social History Narrative  . No narrative on file    Family History  Problem Relation Age of Onset  . Heart disease    . Edema Father     fluid overload  .  Cerebral aneurysm Mother   . Stroke Brother   . Heart disease Brother     Allergies as of 02/02/2013 - Review Complete 02/02/2013  Allergen Reaction Noted  . Neurontin (gabapentin) Anaphylaxis 07/14/2012  . Ezetimibe-simvastatin Other (See Comments)   . Klonopin (clonazepam) Other (See Comments) 05/21/2011  . Sertraline hcl Other (See Comments) 05/21/2011    No current facility-administered medications on file prior to encounter.   Current Outpatient Prescriptions on File Prior to Encounter  Medication Sig Dispense Refill  . acetaminophen (TYLENOL) 325 MG tablet Take 650 mg by mouth every 6 (six) hours as needed for pain.       [name] aspirin 325 MG tablet Take 325 mg by mouth  daily.        [name] atorvastatin (LIPITOR) 10 MG tablet Take 10 mg by mouth daily before breakfast.       . B Complex-C (B-COMPLEX WITH VITAMIN C) tablet Take 1 tablet by mouth daily.      . B Complex-C-Folic Acid (DIALYVITE 800 PO) Take 1 tablet by mouth daily.        . cinacalcet (SENSIPAR) 60 MG tablet Take 60 mg by mouth daily before breakfast.       . collagenase (SANTYL) ointment Apply 1 application topically every other day.      . ibuprofen (ADVIL,MOTRIN) 600 MG tablet Take 300 mg by mouth every 6 (six) hours as needed for pain.       [name] insulin aspart (NOVOLOG) 100 UNIT/ML injection Inject 3-16 Units into the skin 3 (three) times daily before meals. Sliding scale      . insulin glargine (LANTUS) 100 UNIT/ML injection Inject 12 Units into the skin at bedtime.       . midodrine (PROAMATINE) 10 MG tablet Take 10 mg by mouth 3 (three) times a week. Take on Monday, Wednesday, and Friday      . oxyCODONE (OXY IR/ROXICODONE) 5 MG immediate release tablet Take 1 tablet (5 mg total) by mouth every 8 (eight) hours as needed for pain.  30 tablet  0  . pregabalin (LYRICA) 50 MG capsule Take 50 mg by mouth at bedtime.      [name] Propylene Glycol (SYSTANE BALANCE OP) Place 1 drop into both eyes as needed (dry eyes). Dry eyes      . sevelamer (RENVELA) 800 MG tablet Take 2,400 mg by mouth 3 (three) times daily with meals. 3 tabs by mouth three times a day         REVIEW OF SYSTEMS:  Please see review of systems from H&P on 02/02/2013. No changes  PHYSICAL EXAMINATION: General: The patient appears their stated age.  Vital signs are BP 111/66  Pulse 102  Temp(Src) 99.1 F (37.3 C) (Oral)  Resp 18  Ht 5\' 9"  (1.753 m)  Wt 208 lb 8.9 oz (94.6 kg)  BMI 30.78 kg/m2  SpO2 93% Pulmonary: Respirations are non-labored HEENT:  No gross abnormalities Abdomen: Soft and non-tender  Musculoskeletal: Right leg amputation Neurologic: No focal weakness or paresthesias are detected, Skin: 6 cm ulcer on the left  heel with dry eschar. I do not see any purulence today. The other is improved. Psychiatric: The patient has normal affect. Cardiovascular: There is a regular rate and rhythm without significant murmur appreciated.    Assessment:  Limb threatening ischemia to left heel secondary to peripheral vascular disease Plan: The patient wishes to attend a memorial service for her brother tomorrow at [employer] which she has been planning for several  months. After reviewing Dr. Edilia Bo is most recent note and comparing her physical exam today, I do not feel that there has been marked it change. The wound does appear chronic and dry. She did not have any systemic signs of sepsis. I have further that if she feels she must go to to the service tomorrow that I would be okay with that. I would recommend making sure that she gets the appropriate antibiotic therapy administered during her dialysis sessions. I will have my office contact her at the beginning of the week to move up her surgery date.     Jorge Ny, M.D. [facility] [place] Office: [phone] Pager:  [phone]

## 2013-02-04 LAB — HEMOGLOBIN A1C
Hgb A1c MFr Bld: 5.8 % — ABNORMAL HIGH (ref <5.7)
Mean Plasma Glucose: 120 mg/dL — ABNORMAL HIGH (ref <117)

## 2013-02-04 NOTE — ED Provider Notes (Signed)
Please see my complete note. History/physical exam/procedure(s) were performed by non-physician practitioner and as supervising physician I was immediately available for consultation/collaboration. I have reviewed all notes and am in agreement with care and plan.   Hilario Quarry, MD 02/04/13 [phone]

## 2013-02-07 ENCOUNTER — Encounter (HOSPITAL_COMMUNITY): Payer: Self-pay | Admitting: Pharmacy Technician

## 2013-02-09 LAB — CULTURE, BLOOD (ROUTINE X 2): Culture: NO GROWTH

## 2013-02-11 DIAGNOSIS — G459 Transient cerebral ischemic attack, unspecified: Secondary | ICD-10-CM

## 2013-02-11 HISTORY — DX: Transient cerebral ischemic attack, unspecified: G45.9

## 2013-02-12 ENCOUNTER — Emergency Department (HOSPITAL_COMMUNITY): Payer: Medicare Other

## 2013-02-12 ENCOUNTER — Inpatient Hospital Stay (HOSPITAL_COMMUNITY)
Admission: EM | Admit: 2013-02-12 | Discharge: 2013-02-13 | DRG: 064 | Disposition: A | Discharge status: Home Health | Payer: Medicare Other | Attending: Family Medicine | Admitting: Family Medicine

## 2013-02-12 ENCOUNTER — Encounter (HOSPITAL_COMMUNITY): Payer: Self-pay | Admitting: Emergency Medicine

## 2013-02-12 ENCOUNTER — Other Ambulatory Visit: Payer: Medicare Other

## 2013-02-12 DIAGNOSIS — S78119A Complete traumatic amputation at level between unspecified hip and knee, initial encounter: Secondary | ICD-10-CM

## 2013-02-12 DIAGNOSIS — I739 Peripheral vascular disease, unspecified: Secondary | ICD-10-CM | POA: Diagnosis present

## 2013-02-12 DIAGNOSIS — E1351 Other specified diabetes mellitus with diabetic peripheral angiopathy without gangrene: Secondary | ICD-10-CM

## 2013-02-12 DIAGNOSIS — I2589 Other forms of chronic ischemic heart disease: Secondary | ICD-10-CM | POA: Diagnosis present

## 2013-02-12 DIAGNOSIS — I639 Cerebral infarction, unspecified: Secondary | ICD-10-CM

## 2013-02-12 DIAGNOSIS — I798 Other disorders of arteries, arterioles and capillaries in diseases classified elsewhere: Secondary | ICD-10-CM

## 2013-02-12 DIAGNOSIS — G4733 Obstructive sleep apnea (adult) (pediatric): Secondary | ICD-10-CM | POA: Diagnosis present

## 2013-02-12 DIAGNOSIS — I6350 Cerebral infarction due to unspecified occlusion or stenosis of unspecified cerebral artery: Principal | ICD-10-CM

## 2013-02-12 DIAGNOSIS — E1129 Type 2 diabetes mellitus with other diabetic kidney complication: Secondary | ICD-10-CM

## 2013-02-12 DIAGNOSIS — Z992 Dependence on renal dialysis: Secondary | ICD-10-CM

## 2013-02-12 DIAGNOSIS — L97509 Non-pressure chronic ulcer of other part of unspecified foot with unspecified severity: Secondary | ICD-10-CM | POA: Diagnosis present

## 2013-02-12 DIAGNOSIS — E785 Hyperlipidemia, unspecified: Secondary | ICD-10-CM | POA: Diagnosis present

## 2013-02-12 DIAGNOSIS — Z794 Long term (current) use of insulin: Secondary | ICD-10-CM

## 2013-02-12 DIAGNOSIS — I2510 Atherosclerotic heart disease of native coronary artery without angina pectoris: Secondary | ICD-10-CM | POA: Diagnosis present

## 2013-02-12 DIAGNOSIS — M329 Systemic lupus erythematosus, unspecified: Secondary | ICD-10-CM | POA: Diagnosis present

## 2013-02-12 DIAGNOSIS — E1169 Type 2 diabetes mellitus with other specified complication: Secondary | ICD-10-CM | POA: Diagnosis present

## 2013-02-12 DIAGNOSIS — I10 Essential (primary) hypertension: Secondary | ICD-10-CM

## 2013-02-12 DIAGNOSIS — N2581 Secondary hyperparathyroidism of renal origin: Secondary | ICD-10-CM | POA: Diagnosis present

## 2013-02-12 DIAGNOSIS — Z7982 Long term (current) use of aspirin: Secondary | ICD-10-CM

## 2013-02-12 DIAGNOSIS — R5383 Other fatigue: Secondary | ICD-10-CM

## 2013-02-12 DIAGNOSIS — I255 Ischemic cardiomyopathy: Secondary | ICD-10-CM

## 2013-02-12 DIAGNOSIS — I2789 Other specified pulmonary heart diseases: Secondary | ICD-10-CM | POA: Diagnosis present

## 2013-02-12 DIAGNOSIS — G459 Transient cerebral ischemic attack, unspecified: Secondary | ICD-10-CM

## 2013-02-12 DIAGNOSIS — Z951 Presence of aortocoronary bypass graft: Secondary | ICD-10-CM

## 2013-02-12 DIAGNOSIS — Z940 Kidney transplant status: Secondary | ICD-10-CM

## 2013-02-12 DIAGNOSIS — E1159 Type 2 diabetes mellitus with other circulatory complications: Secondary | ICD-10-CM | POA: Diagnosis present

## 2013-02-12 DIAGNOSIS — Z79899 Other long term (current) drug therapy: Secondary | ICD-10-CM

## 2013-02-12 DIAGNOSIS — N186 End stage renal disease: Secondary | ICD-10-CM

## 2013-02-12 DIAGNOSIS — R4701 Aphasia: Secondary | ICD-10-CM | POA: Diagnosis present

## 2013-02-12 DIAGNOSIS — I120 Hypertensive chronic kidney disease with stage 5 chronic kidney disease or end stage renal disease: Secondary | ICD-10-CM | POA: Diagnosis present

## 2013-02-12 LAB — CBC WITH DIFFERENTIAL/PLATELET
Basophils Relative: 1 % (ref 0–1)
HCT: 43.4 % (ref 36.0–46.0)
Hemoglobin: 14.3 g/dL (ref 12.0–15.0)
Lymphocytes Relative: 10 % — ABNORMAL LOW (ref 12–46)
MCHC: 32.9 g/dL (ref 30.0–36.0)
Monocytes Absolute: 0.9 10*3/uL (ref 0.1–1.0)
Monocytes Relative: 9 % (ref 3–12)
Neutro Abs: 7.4 10*3/uL (ref 1.7–7.7)

## 2013-02-12 LAB — PROTIME-INR: INR: 1.37 (ref 0.00–1.49)

## 2013-02-12 LAB — BASIC METABOLIC PANEL
BUN: 12 mg/dL (ref 6–23)
Chloride: 93 mEq/L — ABNORMAL LOW (ref 96–112)
GFR calc Af Amer: 12 mL/min — ABNORMAL LOW (ref >90)
Potassium: 3.5 mEq/L (ref 3.5–5.1)

## 2013-02-12 LAB — GLUCOSE, CAPILLARY: Glucose-Capillary: 99 mg/dL (ref 70–99)

## 2013-02-12 LAB — TROPONIN I: Troponin I: <0.3 ng/mL (ref <0.30)

## 2013-02-12 LAB — PHOSPHORUS: Phosphorus: 3.1 mg/dL (ref 2.3–4.6)

## 2013-02-12 MED ORDER — CINACALCET HCL 30 MG PO TABS
60.0000 mg | ORAL_TABLET | Freq: Every day | ORAL | Status: DC
  Administered 2013-02-13: 60 mg via ORAL
  Filled 2013-02-12 (×2): qty 2

## 2013-02-12 MED ORDER — COLLAGENASE 250 UNIT/GM EX OINT
1.0000 "application " | TOPICAL_OINTMENT | CUTANEOUS | Status: DC
  Administered 2013-02-13: 1 via TOPICAL
  Filled 2013-02-12: qty 30

## 2013-02-12 MED ORDER — B COMPLEX-C PO TABS
1.0000 | ORAL_TABLET | Freq: Every day | ORAL | Status: DC
  Administered 2013-02-13: 1 via ORAL
  Filled 2013-02-12: qty 1

## 2013-02-12 MED ORDER — OXYCODONE HCL 5 MG PO TABS: 5.0000 mg | ORAL_TABLET | Freq: Three times a day (TID) | ORAL | Status: DC | PRN

## 2013-02-12 MED ORDER — TETRAHYDROZOLINE-ZN SULFATE 0.05-0.25 % OP SOLN: 2.0000 [drp] | Freq: Three times a day (TID) | OPHTHALMIC | Status: DC | PRN

## 2013-02-12 MED ORDER — INSULIN ASPART 100 UNIT/ML ~~LOC~~ SOLN: 0.0000 [IU] | Freq: Three times a day (TID) | SUBCUTANEOUS | Status: DC

## 2013-02-12 MED ORDER — SENNOSIDES-DOCUSATE SODIUM 8.6-50 MG PO TABS: 1.0000 | ORAL_TABLET | Freq: Every evening | ORAL | Status: DC | PRN

## 2013-02-12 MED ORDER — INSULIN GLARGINE 100 UNIT/ML ~~LOC~~ SOLN
12.0000 [IU] | Freq: Every day | SUBCUTANEOUS | Status: DC
  Administered 2013-02-13: 12 [IU] via SUBCUTANEOUS
  Filled 2013-02-12 (×2): qty 0.12

## 2013-02-12 MED ORDER — ATORVASTATIN CALCIUM 10 MG PO TABS
10.0000 mg | ORAL_TABLET | Freq: Every day | ORAL | Status: DC
  Administered 2013-02-13: 10 mg via ORAL
  Filled 2013-02-12 (×2): qty 1

## 2013-02-12 MED ORDER — CLOPIDOGREL BISULFATE 75 MG PO TABS
75.0000 mg | ORAL_TABLET | Freq: Every day | ORAL | Status: DC
  Administered 2013-02-13: 75 mg via ORAL
  Filled 2013-02-12 (×2): qty 1

## 2013-02-12 MED ORDER — PREGABALIN 50 MG PO CAPS
50.0000 mg | ORAL_CAPSULE | Freq: Every day | ORAL | Status: DC
  Administered 2013-02-13: 50 mg via ORAL
  Filled 2013-02-12: qty 1

## 2013-02-12 MED ORDER — SODIUM CHLORIDE 0.9 % IJ SOLN
3.0000 mL | Freq: Two times a day (BID) | INTRAMUSCULAR | Status: DC
  Administered 2013-02-13: 3 mL via INTRAVENOUS

## 2013-02-12 MED ORDER — ENOXAPARIN SODIUM 30 MG/0.3ML ~~LOC~~ SOLN
30.0000 mg | SUBCUTANEOUS | Status: DC
  Administered 2013-02-13: 30 mg via SUBCUTANEOUS
  Filled 2013-02-12 (×2): qty 0.3

## 2013-02-12 MED ORDER — MIDODRINE HCL 5 MG PO TABS
10.0000 mg | ORAL_TABLET | ORAL | Status: DC
  Filled 2013-02-12: qty 2

## 2013-02-12 MED ORDER — SEVELAMER CARBONATE 800 MG PO TABS
2400.0000 mg | ORAL_TABLET | Freq: Three times a day (TID) | ORAL | Status: DC
  Administered 2013-02-13 (×2): 2400 mg via ORAL
  Filled 2013-02-12 (×4): qty 3

## 2013-02-12 NOTE — ED Notes (Signed)
IV team returned paged, report they have a few pts in front of her but will be there as soon as possible.

## 2013-02-12 NOTE — Consult Note (Addendum)
Referring Physician: Dr. Bebe Shaggy    Chief Complaint: Transient speech difficulty and confusion.  HPI: [name] is an 57 y.o. female a history of diabetes mellitus, hyperlipidemia, hypertension, end-stage renal disease on dialysis and pulmonary hypertension, who experienced an episode of transient speech output difficulty and confusion while in the emergency room being evaluated following a syncopal spell at dialysis earlier today. Patient was last known well at 1900 today. Has no previous history of stroke nor TIA. Patient has been on aspirin daily. CT scan showed no acute intracranial abnormality. Time patient returned from CT scan to the emergency room speech abnormality and confusion had resolved. NIH stroke score was 1 for mild sensory changes on the right.  LSN: 7 PM on 02/12/2013 tPA Given: No: Rapidly resolving deficits mRankin: Space 0  Past Medical History  Diagnosis Date  . Diabetes mellitus, type 2   . Kidney disease     ESRD secondary to DM, failed transplant 2006. Dialysis  . Secondary hyperparathyroidism   . OSA (obstructive sleep apnea)     NPSG 11.23.11-AHI 64.9/hr-central and obstructive apnea  . Ischemic cardiomyopathy     EF 25-30% 2011  . CAD (coronary artery disease)   . Pulmonary hypertension     Est PAsyst 60-Dr Mendel Ryder, off coumadin, CT neg for PE  . Hyperlipidemia   . Hypertension   . Anemia   . Thyroid disease   . Pulmonary edema   . Peripheral vascular disease   . Sebaceous cyst   . 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 Velvet Bathe    Family History  Problem Relation Age of Onset  . Heart disease    . Edema Father     fluid overload  . Cerebral aneurysm Mother   . Stroke Brother   . Heart disease Brother      Medications: I have reviewed the patient's current medications. Prior to Admission:   acetaminophen (TYLENOL) 325 MG tablet   Oral   Take 650 mg by mouth every 6 (six) hours as needed for pain.       [name]  aspirin 325 MG tablet    Oral   Take 325 mg by mouth daily.       [name]  atorvastatin (LIPITOR) 10 MG tablet   Oral   Take 10 mg by mouth daily before breakfast.       .  B Complex-C (B-COMPLEX WITH VITAMIN C) tablet   Oral   Take 1 tablet by mouth daily.       .  cinacalcet (SENSIPAR) 60 MG tablet   Oral   Take 60 mg by mouth daily before breakfast.       .  collagenase (SANTYL) ointment   Topical   Apply 1 application topically every other day.       .  ibuprofen (ADVIL,MOTRIN) 600 MG tablet   Oral   Take 300 mg by mouth every 6 (six) hours as needed for pain.       [name]  insulin aspart (NOVOLOG) 100 UNIT/ML injection   Subcutaneous   Inject 3-16 Units into the skin 3 (three) times daily before meals. Sliding scale       .  insulin glargine (LANTUS) 100 UNIT/ML injection   Subcutaneous   Inject 12 Units into the skin at bedtime.       .  midodrine (PROAMATINE) 10 MG tablet   Oral   Take 10 mg by mouth 3 (three) times a week. Take on Monday, Wednesday,  and Friday       .  oxyCODONE (OXY IR/ROXICODONE) 5 MG immediate release tablet   Oral   Take 1 tablet (5 mg total) by mouth every 8 (eight) hours as needed for pain.   30 tablet   0   .  pregabalin (LYRICA) 50 MG capsule   Oral   Take 50 mg by mouth at bedtime.       [name]  Propylene Glycol (SYSTANE BALANCE OP)   Both Eyes   Place 1 drop into both eyes as needed (dry eyes). Dry eyes       .  sevelamer (RENVELA) 800 MG tablet   Oral   Take 2,400 mg by mouth 3 (three) times daily with meals. 3 tabs by mouth three times a day       .  tetrahydrozoline-zinc (VISINE-AC) 0.05-0.25 % ophthalmic solution   Both Eyes   Place 2 drops into both eyes 3 (three) times daily as needed (for redness).         Physical Examination: Blood pressure 143/87, pulse 99, temperature 97.7 F (36.5 C), temperature source Oral, resp. rate 20, SpO2 94.00%.  Neurologic Examination: Mental Status: Alert, oriented, thought content appropriate.  Speech fluent without evidence of aphasia. Able to follow commands  without difficulty. Cranial Nerves: II-Visual fields were normal. III/IV/VI-Pupils were equal and reacted. Extraocular movements were full and conjugate.    V/VII-no facial numbness and no facial weakness. VIII-normal. X-normal speech and symmetrical palatal movement. XII-midline tongue extension Motor: 5/5 bilaterally with normal tone; right BK amputation. Sensory: Reduced perception of tactile sensation over right upper and lower extremities compared to left extremities. Deep Tendon Reflexes: Absent throughout. Plantars: Mute on the left Cerebellar: Normal finger-to-nose testing. Carotid auscultation: Normal  Ct Head Wo Contrast  02/12/2013   *RADIOLOGY REPORT*  Clinical Data: Code strokeweakness and confusion  CT HEAD WITHOUT CONTRAST  Technique:  Contiguous axial images were obtained from the base of the skull through the vertex without contrast.  Comparison: None.  Findings: Examination is degraded by patient motion. No acute hemorrhage, acute infarction, or mass lesion is seen.  Orbits and paranasal sinuses are intact.  No skull fracture. No soft tissue abnormality.  IMPRESSION: No acute intracranial finding. These results were called by telephone on 02/12/2013 at 7:35 p.m. to Dr. Bebe Shaggy, who verbally acknowledged these results.   Original Report Authenticated By: Christiana Pellant, M.D.    Assessment: 57 y.o. female with significant risk factors for stroke including diabetes mellitus, hypertension and hyperlipidemia with probable TIA manifested by expressive aphasia and confusion. Acute left MCA territory stroke cannot be ruled out.  Stroke Risk Factors - diabetes mellitus, hyperlipidemia and hypertension  Plan: 1. HgbA1c, fasting lipid panel 2. MRI, MRA  of the brain without contrast 3. PT consult 4. Echocardiogram 5. Carotid dopplers 6. Prophylactic therapy-Antiplatelet med: Plavix  7. Risk factor modification 8. Telemetry monitoring   C.R. Roseanne Reno, MD [facility] [phone]  02/12/2013, 7:56 PM

## 2013-02-12 NOTE — Progress Notes (Signed)
Unit CM UR Completed by [facility] ED CM  W. Zeki Bedrosian RN  

## 2013-02-12 NOTE — ED Notes (Signed)
Attempted to gain IV access X 3, unsuccessful. Paged IV team.

## 2013-02-12 NOTE — ED Notes (Signed)
Per EMS: Pt from dialysis center (lives at home). Completed dialysis. Started to stand up after dialysis and became weak and had shaky legs/ could not stand up on her own. Dialysis center called ems to have pt transported to [facility] for evaluation. BP 140/72, HR 88, RR 16, CBG 165.

## 2013-02-12 NOTE — ED Notes (Signed)
EDP aware of change in PT condition . Code stroke paged out per protocol .

## 2013-02-12 NOTE — H&P (Signed)
[facility] History and Physical  [name] ZOX:[id number] DOB: [birth year] DOA: 02/12/2013  Referring physician: ER physician. PCP: Katy Apo, MD  Specialists: [facility].  Chief Complaint: Fatigue and difficulty speaking.  HPI: [name] is a 57 y.o. female who was brought to the ER the patient was complaining of fatigue. In the ER patient was found to be initially with no focal deficits and was about to be discharged when patient was found to be suddenly confused with expressive aphasia which lasted for around 10 minutes after which patient return back to normal baseline. CT head did not show anything acute. Neurologist on-call was consulted. At this time patient has been admitted for further management rate on my exam patient still has some expressive aphasia and and confusion. Patient is able to move all extremities and able to see from both eyes. Denies any chest pain shortness of breath nausea vomiting abdominal pain. Patient was recently admitted for left foot diabetic ulcer. Patient was to get surgery for the left foot ulcer.  Review of Systems: As presented in the history of presenting illness, rest negative.  Past Medical History  Diagnosis Date  . Diabetes mellitus, type 2   . Kidney disease     ESRD secondary to DM, failed transplant 2006. Dialysis  . Secondary hyperparathyroidism   . OSA (obstructive sleep apnea)     NPSG 11.23.11-AHI 64.9/hr-central and obstructive apnea  . Ischemic cardiomyopathy     EF 25-30% 2011  . CAD (coronary artery disease)   . Pulmonary hypertension     Est PAsyst 60-Dr Mendel Ryder, off coumadin, CT neg for PE  . Hyperlipidemia   . Hypertension   . Anemia   . Thyroid disease   . Pulmonary edema   . Peripheral vascular disease   . Sebaceous cyst   . 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 Velvet Bathe   Past Surgical History  Procedure Laterality Date  . Coronary artery bypass graft  2011    3V  .  Insertion of dialysis catheter    . 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 Nasal mucosal biopsy    . Ovarian cyst surgery    . 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 Laser retinal surgery  2011  . Kidney transplant      rejection  . Abdominal angiogram Left 01/25/13   Social History:  reports that she has never smoked. She has never used smokeless tobacco. She reports that she does not drink alcohol or use illicit drugs. Lives at home. where does patient live-- Not sure. Can patient participate in ADLs?  Allergies  Allergen Reactions  . Neurontin (Gabapentin) Anaphylaxis  . Ezetimibe-Simvastatin Other (See Comments)    REACTION: face edema; itching  . Klonopin (Clonazepam) Other (See Comments)    hallucination  . Sertraline Hcl Other (See Comments)    unknown    Family History  Problem Relation Age of Onset  . Heart disease    . Edema Father     fluid overload  . Cerebral aneurysm Mother   . Stroke Brother   . Heart disease Brother       Prior to Admission medications   Medication Sig Start Date End Date Taking? Authorizing Provider  acetaminophen (TYLENOL) 325 MG tablet Take 650 mg by mouth every 6 (six) hours as needed for pain.    Yes Historical Provider, MD  aspirin 325 MG tablet Take  325 mg by mouth daily.     Yes Historical Provider, MD  atorvastatin (LIPITOR) 10 MG tablet Take 10 mg by mouth daily before breakfast.    Yes Historical Provider, MD  B Complex-C (B-COMPLEX WITH VITAMIN C) tablet Take 1 tablet by mouth daily.   Yes Historical Provider, MD  cinacalcet (SENSIPAR) 60 MG tablet Take 60 mg by mouth daily before breakfast.    Yes Historical Provider, MD  collagenase (SANTYL) ointment Apply 1 application topically every other day.   Yes Historical Provider, MD  ibuprofen (ADVIL,MOTRIN) 600 MG tablet Take 300 mg by mouth every 6 (six) hours as needed for pain.  01/20/13  Yes Historical Provider, MD  insulin  aspart (NOVOLOG) 100 UNIT/ML injection Inject 3-16 Units into the skin 3 (three) times daily before meals. Sliding scale   Yes Historical Provider, MD  insulin glargine (LANTUS) 100 UNIT/ML injection Inject 12 Units into the skin at bedtime.    Yes Historical Provider, MD  midodrine (PROAMATINE) 10 MG tablet Take 10 mg by mouth 3 (three) times a week. Take on Monday, Wednesday, and Friday   Yes Historical Provider, MD  oxyCODONE (OXY IR/ROXICODONE) 5 MG immediate release tablet Take 1 tablet (5 mg total) by mouth every 8 (eight) hours as needed for pain. 01/31/13  Yes Chuck Hint, MD  pregabalin (LYRICA) 50 MG capsule Take 50 mg by mouth at bedtime.   Yes Historical Provider, MD  Propylene Glycol (SYSTANE BALANCE OP) Place 1 drop into both eyes as needed (dry eyes). Dry eyes   Yes Historical Provider, MD  sevelamer (RENVELA) 800 MG tablet Take 2,400 mg by mouth 3 (three) times daily with meals. 3 tabs by mouth three times a day   Yes Historical Provider, MD  tetrahydrozoline-zinc (VISINE-AC) 0.05-0.25 % ophthalmic solution Place 2 drops into both eyes 3 (three) times daily as needed (for redness).   Yes Historical Provider, MD   Physical Exam: Filed Vitals:   02/12/13 1945 02/12/13 2000 02/12/13 2100 02/12/13 2108  BP: 123/69 103/76 106/58   Pulse: 86 86 89   Temp:    98.6 F (37 C)  TempSrc:      Resp:      SpO2: 99% 100% 97%      General:  Well-developed well-nourished.  Eyes: Anicteric no pallor.  ENT: No discharge from the ears eyes nose mouth.  Neck: No mass felt.  Cardiovascular: S1-S2 heard.  Respiratory: No rhonchi or crepitations.  Abdomen: Soft nontender bowel sounds present.  Skin: Left foot ulcer in the healing area with no active discharge.  Musculoskeletal: No edema.  Psychiatric: Patient is alert awake oriented to her name. Follows commands.  Neurologic: Moves all extremities.  Labs on Admission:  Basic Metabolic Panel:  Recent Labs Lab  02/12/13 1354  NA 136  K 3.5  CL 93*  CO2 32  GLUCOSE 135*  BUN 12  CREATININE 4.34*  CALCIUM 8.5  MG 2.3  PHOS 3.1   Liver Function Tests: No results found for this basename: AST, ALT, ALKPHOS, BILITOT, PROT, ALBUMIN,  in the last 168 hours No results found for this basename: LIPASE, AMYLASE,  in the last 168 hours No results found for this basename: AMMONIA,  in the last 168 hours CBC:  Recent Labs Lab 02/12/13 1354  WBC 9.4  NEUTROABS 7.4  HGB 14.3  HCT 43.4  MCV 85.3  PLT 141*   Cardiac Enzymes:  Recent Labs Lab 02/12/13 1354 02/12/13 1625  TROPONINI <0.30 <0.30  BNP (last 3 results) No results found for this basename: PROBNP,  in the last 8760 hours CBG:  Recent Labs Lab 02/12/13 1815  GLUCAP 99    Radiological Exams on Admission: Ct Head Wo Contrast  02/12/2013   *RADIOLOGY REPORT*  Clinical Data: Code strokeweakness and confusion  CT HEAD WITHOUT CONTRAST  Technique:  Contiguous axial images were obtained from the base of the skull through the vertex without contrast.  Comparison: None.  Findings: Examination is degraded by patient motion. No acute hemorrhage, acute infarction, or mass lesion is seen.  Orbits and paranasal sinuses are intact.  No skull fracture. No soft tissue abnormality.  IMPRESSION: No acute intracranial finding. These results were called by telephone on 02/12/2013 at 7:35 p.m. to Dr. Bebe Shaggy, who verbally acknowledged these results.   Original Report Authenticated By: Christiana Pellant, M.D.     Assessment/Plan Principal Problem:   CVA (cerebral infarction) Active Problems:   HYPERTENSION   End stage renal disease   PVD (peripheral vascular disease)   DM (diabetes mellitus), secondary, w/peripheral vascular complications   1. Possible CVA versus TIA - patient has been placed on neurochecks. Swallow evaluation. MRI/MRA brain 2-D echo and carotid Doppler has been ordered. Telemetry shows sinus rhythm. 2. ESRD on hemodialysis -  Monday Wednesday and Friday. Patient had dialysis yesterday. Consult nephrologist for dialysis. 3. Diabetes mellitus type 2 - continue home medications with close followup of CBG. 4. Hypertension - continue home medications. 5. Peripheral vascular disease with left foot ulcer - presently also has no active discharge. Wound team consult.    Code Status: Full code.  Family Communication: None.  Disposition Plan: Admit to inpatient.    Nichelle Renwick N. [facility] Pager [phone].  If 7PM-7AM, please contact night-coverage [email or web address] Password [facility] 02/12/2013, 9:28 PM

## 2013-02-12 NOTE — ED Notes (Signed)
During the D/C process Pt was observed to have expressive aphagia . Pt unable to complete a full sentence . Pt could not remember how to put on the Lt leg prothesis multiple attempts were made by pt. Pt Alert to self only. Current CBG 99.

## 2013-02-12 NOTE — ED Notes (Signed)
Per Bebe Shaggy MD pt is not being d/c yet. Pt having new onset intermittent confusion.

## 2013-02-12 NOTE — ED Provider Notes (Addendum)
History     CSN: [id number]  Arrival date & time 02/12/13  1204   First MD Initiated Contact with Patient 02/12/13 1254      Chief Complaint  Patient presents with  . Fatigue    Patient is a 57 y.o. female presenting with weakness. The history is provided by the patient.  Weakness This is a new problem. Episode onset: just prior to arrival after full dialysis. The problem occurs constantly. The problem has not changed since onset.Pertinent negatives include no chest pain, no abdominal pain, no headaches and no shortness of breath. The symptoms are aggravated by standing. She has tried rest for the symptoms. The treatment provided mild relief.    Past Medical History  Diagnosis Date  . Diabetes mellitus, type 2   . Kidney disease     ESRD secondary to DM, failed transplant 2006. Dialysis  . Secondary hyperparathyroidism   . OSA (obstructive sleep apnea)     NPSG 11.23.11-AHI 64.9/hr-central and obstructive apnea  . Ischemic cardiomyopathy     EF 25-30% 2011  . CAD (coronary artery disease)   . Pulmonary hypertension     Est PAsyst 60-Dr Mendel Ryder, off coumadin, CT neg for PE  . Hyperlipidemia   . Hypertension   . Anemia   . Thyroid disease   . Pulmonary edema   . Peripheral vascular disease   . Sebaceous cyst   . 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 Velvet Bathe    Past Surgical History  Procedure Laterality Date  . Coronary artery bypass graft  2011    3V  . Insertion of dialysis catheter    . 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 Nasal mucosal biopsy    . Ovarian cyst surgery    . 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 Laser retinal surgery  2011  . Kidney transplant      rejection  . Abdominal angiogram Left 01/25/13    Family History  Problem Relation Age of Onset  . Heart disease    . Edema Father     fluid overload  . Cerebral aneurysm Mother   . Stroke Brother   . Heart disease Brother      History  Substance Use Topics  . Smoking status: Never Smoker   . Smokeless tobacco: Never Used  . Alcohol Use: No    OB History   Grav Para Term Preterm Abortions TAB SAB Ect Mult Living                  Review of Systems  Constitutional: Positive for fatigue. Negative for fever.  HENT: Negative for neck pain.   Eyes: Negative for visual disturbance.  Respiratory: Negative for shortness of breath.   Cardiovascular: Negative for chest pain.  Gastrointestinal: Negative for vomiting and abdominal pain.  Neurological: Positive for weakness. Negative for syncope, speech difficulty and headaches.  All other systems reviewed and are negative.    Allergies  Neurontin; Ezetimibe-simvastatin; Klonopin; and Sertraline hcl  Home Medications   Current Outpatient Rx  Name  Route  Sig  Dispense  Refill  . acetaminophen (TYLENOL) 325 MG tablet   Oral   Take 650 mg by mouth every 6 (six) hours as needed for pain.          [name] aspirin 325 MG tablet   Oral   Take 325 mg by mouth daily.           [name]  atorvastatin (LIPITOR) 10 MG tablet   Oral   Take 10 mg by mouth daily before breakfast.          . B Complex-C (B-COMPLEX WITH VITAMIN C) tablet   Oral   Take 1 tablet by mouth daily.         . cinacalcet (SENSIPAR) 60 MG tablet   Oral   Take 60 mg by mouth daily before breakfast.          . collagenase (SANTYL) ointment   Topical   Apply 1 application topically every other day.         . ibuprofen (ADVIL,MOTRIN) 600 MG tablet   Oral   Take 300 mg by mouth every 6 (six) hours as needed for pain.          [name] insulin aspart (NOVOLOG) 100 UNIT/ML injection   Subcutaneous   Inject 3-16 Units into the skin 3 (three) times daily before meals. Sliding scale         . insulin glargine (LANTUS) 100 UNIT/ML injection   Subcutaneous   Inject 12 Units into the skin at bedtime.          . midodrine (PROAMATINE) 10 MG tablet   Oral   Take 10 mg by mouth 3 (three)  times a week. Take on Monday, Wednesday, and Friday         . oxyCODONE (OXY IR/ROXICODONE) 5 MG immediate release tablet   Oral   Take 1 tablet (5 mg total) by mouth every 8 (eight) hours as needed for pain.   30 tablet   0   . pregabalin (LYRICA) 50 MG capsule   Oral   Take 50 mg by mouth at bedtime.         [name] Propylene Glycol (SYSTANE BALANCE OP)   Both Eyes   Place 1 drop into both eyes as needed (dry eyes). Dry eyes         . sevelamer (RENVELA) 800 MG tablet   Oral   Take 2,400 mg by mouth 3 (three) times daily with meals. 3 tabs by mouth three times a day         . tetrahydrozoline-zinc (VISINE-AC) 0.05-0.25 % ophthalmic solution   Both Eyes   Place 2 drops into both eyes 3 (three) times daily as needed (for redness).           BP 117/62  Pulse 85  Temp(Src) 99.1 F (37.3 C) (Oral)  Resp 13  SpO2 96% BP 132/77  Pulse 93  Temp(Src) 99.1 F (37.3 C) (Oral)  Resp 16  SpO2 100%  Physical Exam CONSTITUTIONAL: Well developed/well nourished HEAD: Normocephalic/atraumatic EYES: EOMI/PERRL ENMT: Mucous membranes moist NECK: supple no meningeal signs SPINE:entire spine nontender CV: S1/S2 noted, no murmurs/rubs/gallops noted LUNGS: Lungs are clear to auscultation bilaterally, no apparent distress ABDOMEN: soft, nontender, no rebound or guarding GU:no cva tenderness NEURO: Pt is awake/alert, moves all extremitiesx4 No arm/leg drift.  No facial droop EXTREMITIES: pulses normal, full ROM Right BKA noted.  Left heel has small healed ulcer without overlying erythema/crepitance/drainage.   Dialysis access to right UE with thrill  SKIN: warm, color normal PSYCH: no abnormalities of mood noted  ED Course  Procedures  Pt presents after dialysis She had full session and while standing after she felt lightheaded - no LOC No ha/cp/sob.  She just reports fatigue She usually does not feel this way after HD She has not missed session recently and no new  meds She makes  only minimal urine Labs ordered.  She has no abnormal lung sounds and she denies increased SOB 3:57 PM Pt stable. Labs thus far reassuring She still feels fatigued but is unable to provide any further complaints Will recheck troponin to ensure no dynamic change  5:54 PM Labs reassuring She is well appearing She is taking PO No cp/sob is reported Stable for d/c  MDM  Nursing notes including past medical history and social history reviewed and considered in documentation Labs/vital reviewed and considered        Date: 02/12/2013 1235pm  Rate: 86  Rhythm: normal sinus rhythm  QRS Axis: left  Intervals: QT prolonged  ST/T Wave abnormalities: nonspecific ST changes  Conduction Disutrbances:first-degree A-V block   Narrative Interpretation:   Old EKG Reviewed: unchanged Pt has had prolonged qt/1st degree block previously on ekg from 02/02/13   Joya Gaskins, MD 02/12/13 1755  6:55 PM Called to room by nurse who was concerned for confusion in patient at discharge She is awake/alert, and ambulatory at her baseline.  She appeared flustered and mildly confused but then she was able to recall all personal details and current date without any issue.  She has no focal weakness. No dysarthria  She can ambulate.   She denies HA and denies visual changes. She wants to go home She does not want to stay for any testing Will d/c home I doubt acute CVA  Joya Gaskins, MD 02/12/13 1857  7:01 PM Pt now acting confused for nursing Unclear cause of AMS Will obtain CT head and likely admit   Joya Gaskins, MD 02/12/13 1901  7:53 PM Seen by dr Roseanne Reno, neuro, he feels this is TIA He recommends hospitalist admission for TIA D/w dr Toniann Fail will admit   Joya Gaskins, MD 02/12/13 [phone]

## 2013-02-13 ENCOUNTER — Inpatient Hospital Stay (HOSPITAL_COMMUNITY): Payer: Medicare Other

## 2013-02-13 ENCOUNTER — Other Ambulatory Visit: Payer: Self-pay

## 2013-02-13 ENCOUNTER — Encounter (HOSPITAL_COMMUNITY): Admission: RE | Admit: 2013-02-13 | Payer: Medicare Other | Source: Ambulatory Visit

## 2013-02-13 ENCOUNTER — Other Ambulatory Visit (HOSPITAL_COMMUNITY): Payer: Medicare Other

## 2013-02-13 DIAGNOSIS — R5381 Other malaise: Secondary | ICD-10-CM

## 2013-02-13 LAB — COMPREHENSIVE METABOLIC PANEL
Albumin: 2.4 g/dL — ABNORMAL LOW (ref 3.5–5.2)
Alkaline Phosphatase: 91 U/L (ref 39–117)
BUN: 17 mg/dL (ref 6–23)
CO2: 32 mEq/L (ref 19–32)
Chloride: 94 mEq/L — ABNORMAL LOW (ref 96–112)
GFR calc non Af Amer: 8 mL/min — ABNORMAL LOW (ref >90)
Glucose, Bld: 73 mg/dL (ref 70–99)
Potassium: 3.7 mEq/L (ref 3.5–5.1)
Total Bilirubin: 0.6 mg/dL (ref 0.3–1.2)

## 2013-02-13 LAB — CBC WITH DIFFERENTIAL/PLATELET
Basophils Absolute: 0.1 10*3/uL (ref 0.0–0.1)
Eosinophils Absolute: 0.1 10*3/uL (ref 0.0–0.7)
Eosinophils Relative: 1 % (ref 0–5)
Lymphocytes Relative: 13 % (ref 12–46)
MCV: 85.4 fL (ref 78.0–100.0)
Neutrophils Relative %: 75 % (ref 43–77)
Platelets: 138 10*3/uL — ABNORMAL LOW (ref 150–400)
RDW: 14.8 % (ref 11.5–15.5)
WBC: 10.8 10*3/uL — ABNORMAL HIGH (ref 4.0–10.5)

## 2013-02-13 LAB — LIPID PANEL
HDL: 25 mg/dL — ABNORMAL LOW (ref >39)
LDL Cholesterol: 77 mg/dL (ref 0–99)
VLDL: 28 mg/dL (ref 0–40)

## 2013-02-13 LAB — TSH: TSH: 1.255 u[IU]/mL (ref 0.350–4.500)

## 2013-02-13 LAB — GLUCOSE, CAPILLARY
Glucose-Capillary: 108 mg/dL — ABNORMAL HIGH (ref 70–99)
Glucose-Capillary: 85 mg/dL (ref 70–99)

## 2013-02-13 MED ORDER — CLOPIDOGREL BISULFATE 75 MG PO TABS: 75.0000 mg | ORAL_TABLET | Freq: Every day | ORAL | Status: DC

## 2013-02-13 MED ORDER — ASPIRIN 325 MG PO TABS: 325.0000 mg | ORAL_TABLET | Freq: Every day | ORAL | Status: DC

## 2013-02-13 MED ORDER — LORAZEPAM 0.5 MG PO TABS
0.5000 mg | ORAL_TABLET | Freq: Once | ORAL | Status: AC
  Administered 2013-02-13: 0.5 mg via ORAL
  Filled 2013-02-13: qty 1

## 2013-02-13 NOTE — Care Management Note (Unsigned)
    Page 1 of 1   02/13/2013     4:18:26 PM   CARE MANAGEMENT NOTE 02/13/2013  Patient:  [name], [name]   Account Number:  [id number]  Date Initiated:  02/13/2013  Documentation initiated by:  Elmer Bales  Subjective/Objective Assessment:   Pt admitted for CVA.     Action/Plan:   Will follow for discharge needs,HH PT/SLP   Anticipated DC Date:  02/13/2013   Anticipated DC Plan:  HOME W HOME HEALTH SERVICES      DC Planning Services  CM consult      Choice offered to / List presented to:  C-1 Patient        HH arranged  HH-2 PT  HH-5 SPEECH THERAPY      HH agency  [facility]   Status of service:  Completed, signed off Medicare Important Message given?   (If response is "NO", the following Medicare IM given date fields will be blank) Date Medicare IM given:   Date Additional Medicare IM given:    Discharge Disposition:  HOME W HOME HEALTH SERVICES  Per UR Regulation:  Reviewed for med. necessity/level of care/duration of stay  If discussed at Long Length of Stay Meetings, dates discussed:    Comments:  02/13/13 1600 Elmer Bales RN, MSN CM-  Met with patient and family to discuss home PT/SLP.  Pt has chosen [facility], which she has used in the past. Corrie Dandy from [facility] was notified of referral.

## 2013-02-13 NOTE — Evaluation (Signed)
Physical Therapy Evaluation Patient Details Name: [name] MRN: [id number] DOB: January 02, 1956 Today's Date: 02/13/2013 Time: 4098-1191 PT Time Calculation (min): 30 min  PT Assessment / Plan / Recommendation Clinical Impression  57 y/o female with history of end stage renal disease, dialysis, and diabetes type 2 admitted with aphasia and confusion. Stroke suspected, but MRI not showing acute infarcts. Pt presents to PT with ability to perform bed mobility with modified independence. Transfers and ambulation with supervision/modified indpendence. Pt with fatigue after ambuation. Acute PT to maximize functional mobility and saftey for d/c home.     PT Assessment  Patient needs continued PT services    Follow Up Recommendations  Home health PT;Supervision for mobility/OOB    Frequency Min 3X/week    Precautions / Restrictions Restrictions Weight Bearing Restrictions: No   Pertinent Vitals/Pain Pt verbalizing 8/10 left heel pain during heel strike phase of gait. Repositioned       Mobility  Bed Mobility Bed Mobility: Supine to Sit Supine to Sit: 6: Modified independent (Device/Increase time) Transfers Transfers: Stand to Sit;Sit to Stand Sit to Stand: 5: Supervision Stand to Sit: 5: Supervision; Modified independent (performed x4. After ambulation pt with very fast descent to chair stating "my legs just gave out". Pt states it was due to fatigue. After resting in the chair, pt able to perform stand to sit with modified independence and controlled descent. Details for Transfer Assistance: supervision for saftey Ambulation/Gait Ambulation/Gait Assistance: 5: Supervision;4: Min guard Ambulation Distance (Feet): 275 Feet Assistive device: Rolling walker Ambulation/Gait Assistance Details: Pt with c/o dizziness initially with ambulation, but after sitting to rest and checking BP which was [facility] pt without dizziness.  Gait Pattern: Step-through pattern;Within Functional  Limits;Decreased stance time - right Gait velocity: decreased, but at pt baseline General Gait Details: Pt with right BK amputaion and prosthetic Stairs: No Wheelchair Mobility Wheelchair Mobility: No        PT Diagnosis: Difficulty walking;Generalized weakness  PT Problem List: Decreased strength;Decreased activity tolerance;Decreased balance;Decreased mobility;Decreased safety awareness PT Treatment Interventions: DME instruction;Gait training;Functional mobility training;Therapeutic activities;Therapeutic exercise;Balance training;Neuromuscular re-education;Patient/family education   PT Goals Acute Rehab PT Goals PT Goal Formulation: With patient Time For Goal Achievement: 02/20/13 Potential to Achieve Goals: Good Pt will go Sit to Stand: with modified independence PT Goal: Sit to Stand - Progress: Goal set today Pt will go Stand to Sit: with modified independence PT Goal: Stand to Sit - Progress: Goal set today Pt will Ambulate: >150 feet;with modified independence;with least restrictive assistive device PT Goal: Ambulate - Progress: Goal set today Pt will Propel Wheelchair: > 150 feet;Independently PT Goal: Propel Wheelchair - Progress: Goal set today  Visit Information  Last PT Received On: 02/13/13 Assistance Needed: +1    Subjective Data  Subjective: I just want to know what is making my hand jerky Patient Stated Goal: return home   Prior Functioning  Home Living Lives With: Alone Available Help at Discharge: Family Type of Home: House Home Access: Level entry Home Layout: One level Bathroom Toilet: Handicapped height Bathroom Accessibility: Yes Home Adaptive Equipment: Environmental consultant - four wheeled;Wheelchair - manual;Grab bars in shower Additional Comments: Pt stating "its a handicap accessible home" Prior Function Level of Independence: Independent with assistive device(s) Comments: Pt states she used 4 wheeled walker to ambulate short distances and manual  wheelchair for longer distances. Communication Communication: No difficulties    Cognition  Cognition Arousal/Alertness: Awake/alert Behavior During Therapy: WFL for tasks assessed/performed Overall Cognitive Status: Within Functional Limits for tasks  assessed    Extremity/Trunk Assessment Right Upper Extremity Assessment RUE ROM/Strength/Tone: [facility] for tasks assessed Left Upper Extremity Assessment LUE ROM/Strength/Tone: WFL for tasks assessed (pt complaining of mild jerking in hand, possibly asterixis) Right Lower Extremity Assessment RLE ROM/Strength/Tone: WFL for tasks assessed (pt with BK amputation 2009 and prosthetic) RLE Coordination: WFL - gross motor Left Lower Extremity Assessment LLE ROM/Strength/Tone: WFL for tasks assessed LLE Coordination: WFL - gross motor Trunk Assessment Trunk Assessment: Normal   Balance Balance Balance Assessed: Yes Dynamic Sitting Balance Dynamic Sitting - Balance Support: No upper extremity supported Dynamic Sitting - Level of Assistance: 6: Modified independent (Device/Increase time) Dynamic Sitting - Comments: pt sitting EOB applying gel liner and sock liners for prosthetic without LOB High Level Balance High Level Balance Activites: Backward walking;Direction changes High Level Balance Comments: Pt able to perform backward walking and direction changes with min guard and no LOB  End of Session PT - End of Session Equipment Utilized During Treatment: Gait belt;Right lower extremity prosthesis Activity Tolerance: Patient limited by fatigue Patient left: in chair;with call bell/phone within reach Nurse Communication: Mobility status  GP     Payton Doughty 02/13/2013, 2:16 PM

## 2013-02-13 NOTE — Progress Notes (Signed)
Chart review complete.  Patient is not eligible for [facility] Care Management services because she has not engaged her [facility] PCP in 4 years.  For any additional questions or new referrals please contact Anibal Henderson BSN RN [facility] Liaison at [phone]

## 2013-02-13 NOTE — Consult Note (Addendum)
WOC consult Note Reason for Consult: Consult requested for left heel chronic wound.  Pt has been followed as outpatient by [facility] service. Wound type: Unstageable Pressure Ulcer POA: Yes Measurement: 5X5cm Wound bed: 100% dry stable eschar Drainage (amount, consistency, odor) no odor or drainage Periwound: Dry peeling skin surrounding wound edges. Dressing procedure/placement/frequency: Pt states she had Santyl ointment ordered prior to admission by [facility] team, who are planning re-vascularization procedure next week. Continue present plan of care. Float heel to reduce pressure.  Pt can resume follow-up with [facility] service after discharge.  Discussed pressure ulcer etiology, topical treatment, and preventive measures with patient. Appears to understand. Please re-consult if further assistance is needed.  Thank-you,  Cammie Mcgee MSN, RN, CWOCN, [place], CNS [phone]

## 2013-02-13 NOTE — Evaluation (Signed)
Speech Language Pathology Evaluation Patient Details Name: [name] MRN: [id number] DOB: August 27, 1956 Today's Date: 02/13/2013 Time: 1500-1520 SLP Time Calculation (min): 20 min  Problem List:  Patient Active Problem List   Diagnosis Date Noted  . CVA (cerebral infarction) 02/12/2013  . Cardiomyopathy, ischemic 02/03/2013  . DM (diabetes mellitus), secondary, w/peripheral vascular complications 02/03/2013  . Pain in limb 01/31/2013  . Atherosclerosis of native arteries of the extremities with ulceration(440.23) 01/17/2013  . PVD (peripheral vascular disease) 10/04/2012  . MGUS (monoclonal gammopathy of unknown significance) 07/11/2012  . Infected sebaceous cyst of Right posterior neck. 09/30/2011  . Peripheral vascular disease, unspecified 07/14/2011  . Systemic lupus 06/01/2011  . HYPERLIPIDEMIA 07/28/2010  . HYPERTENSION 07/28/2010  . DIAB W/RENAL MANIFESTS TYPE II/UNS NOT UNCNTRL 07/27/2010  . OBSTRUCTIVE SLEEP APNEA 07/27/2010  . ABNORMAL HEART RHYTHMS 07/27/2010  . End stage renal disease 07/27/2010   Past Medical History:  Past Medical History  Diagnosis Date  . Diabetes mellitus, type 2   . Kidney disease     ESRD secondary to DM, failed transplant 2006. Dialysis  . Secondary hyperparathyroidism   . OSA (obstructive sleep apnea)     NPSG 11.23.11-AHI 64.9/hr-central and obstructive apnea  . Ischemic cardiomyopathy     EF 25-30% 2011  . CAD (coronary artery disease)   . Pulmonary hypertension     Est PAsyst 60-Dr Mendel Ryder, off coumadin, CT neg for PE  . Hyperlipidemia   . Hypertension   . Anemia   . Thyroid disease   . Pulmonary edema   . Peripheral vascular disease   . Sebaceous cyst   . 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 Velvet Bathe   Past Surgical History:  Past Surgical History  Procedure Laterality Date  . Coronary artery bypass graft  2011    3V  . Insertion of dialysis catheter    . 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 Nasal mucosal biopsy    . Ovarian cyst surgery    . 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 Laser retinal surgery  2011  . Kidney transplant      rejection  . Abdominal angiogram Left 01/25/13   HPI:  This 57 year old female history of end-stage renal disease, dialysis [facility] on [employer], history of renal failure status post transplant 2007 with failure, MGUS, multiple other issues presented to the hospital after dialysis 02/12/2013 with syncopal episodes and some moderate confusion. She had just been admitted for diabetic foot ulcer and was started on Plavix and is scheduled to have this next week. It was thought initially that some of her confusion may been related to either infection or strokes issues but MRI was negative for neurology saw patient in consult and did not think patient had a stroke/tia.  Pt to d/c'd shortly.    Assessment / Plan / Recommendation Clinical Impression  Pt presents with mild impairment with word finding and fluency at conversation level. Pt reports she is aware of errors particularly this afternoon. She is also easily distrated by environment, but corrected herlesf more than once during session, "i'm distracted... I need to quit fidgeting". Overall pt appeared different from baseline and would beneit from f/u with home health or outpatient SLP. Pt is preparing for d/c. SLP paged MD for f/u to be arranged.     SLP Assessment  All further Speech Lanaguage Pathology  needs can be addressed in the next venue of  care    Follow Up Recommendations  Home health SLP    Frequency and Duration        Pertinent Vitals/Pain NA   SLP Goals     SLP Evaluation Prior Functioning  Cognitive/Linguistic Baseline: Baseline deficits Baseline deficit details: Pt seen by SLP in 2012 due to cognitive deficits following heart attack (per pt).  Type of Home: House Lives With: Alone Available Help at Discharge: Family Vocation: On disability    Cognition  Overall Cognitive Status: Impaired/Different from baseline Arousal/Alertness: Awake/alert Orientation Level: Oriented X4 Attention: Selective Selective Attention: Impaired Selective Attention Impairment: Verbal complex;Functional complex Memory: Appears intact Awareness: Appears intact Problem Solving: Appears intact Executive Function: Reasoning;Sequencing;Self Monitoring;Self Correcting Reasoning: Appears intact Sequencing: Appears intact Self Monitoring: Appears intact Self Correcting: Appears intact Safety/Judgment: Appears intact    Comprehension  Auditory Comprehension Overall Auditory Comprehension: Appears within functional limits for tasks assessed    Expression Verbal Expression Overall Verbal Expression: Impaired Initiation: No impairment Automatic Speech: Name;Social Response;Month of year Level of Generative/Spontaneous Verbalization: Conversation (impaired, hesitations, circumlocutions) Repetition: No impairment Naming: Impairment Responsive: 76-100% accurate Confrontation: Within functional limits Convergent: Not tested Divergent: Not tested Other Naming Comments: Word finding at conversation level.  Verbal Errors: Aware of errors (not ture paraphasias, but miscues. ) Pragmatics: No impairment Interfering Components: Attention   Oral / Motor Oral Motor/Sensory Function Overall Oral Motor/Sensory Function: Appears within functional limits for tasks assessed Motor Speech Overall Motor Speech: Appears within functional limits for tasks assessed   GO    Harlon Ditty, MA CCC-SLP [phone]  [name] 02/13/2013, 3:29 PM

## 2013-02-13 NOTE — Evaluation (Signed)
I have read and agree with the below treatment note and plan. Jemia Fata Helen Whitlow PT, DPT Pager: [phone] 

## 2013-02-13 NOTE — Progress Notes (Signed)
Stroke Team Progress Note  HISTORY [name] is an 57 y.o. female a history of diabetes mellitus, hyperlipidemia, hypertension, end-stage renal disease on dialysis and pulmonary hypertension, who experienced an episode of transient speech output difficulty and confusion while in the emergency room being evaluated following a syncopal spell at dialysis earlier today. Patient was last known well at 1900 today 02/12/2013. Has no previous history of stroke nor TIA. Patient has been on aspirin daily. CT scan showed no acute intracranial abnormality. Time patient returned from CT scan to the emergency room speech abnormality and confusion had resolved. NIH stroke score was 1 for mild sensory changes on the right. Patient was not a TPA candidate secondary to rapidly improving symptoms. She was admitted for further evaluation and treatment.  SUBJECTIVE No family is at the bedside.  Overall she feels her condition is completely resolved.   OBJECTIVE Most recent Vital Signs: Filed Vitals:   02/13/13 0220 02/13/13 0404 02/13/13 0620 02/13/13 0900  BP: 118/67 120/79 104/66 117/69  Pulse: 86 81 80 84  Temp: 98.7 F (37.1 C) 98.5 F (36.9 C) 98.7 F (37.1 C) 97.8 F (36.6 C)  TempSrc: Oral Oral Oral Oral  Resp: 20 20 18 18   Height:      Weight:      SpO2: 100% 98% 99% 95%   CBG (last 3)   Recent Labs  02/12/13 1815 02/12/13 2357 02/13/13 0627  GLUCAP 99 108* 79    IV Fluid Intake:     MEDICATIONS  . atorvastatin  10 mg Oral QAC breakfast  . B-complex with vitamin C  1 tablet Oral Daily  . cinacalcet  60 mg Oral QAC breakfast  . clopidogrel  75 mg Oral Q breakfast  . collagenase  1 application Topical QODAY  . enoxaparin (LOVENOX) injection  30 mg Subcutaneous Q24H  . insulin aspart  0-9 Units Subcutaneous TID WC  . insulin glargine  12 Units Subcutaneous QHS  . [START ON 02/14/2013] midodrine  10 mg Oral 3 times weekly  . pregabalin  50 mg Oral QHS  . sevelamer carbonate  2,400 mg  Oral TID WC  . sodium chloride  3 mL Intravenous Q12H   PRN:  oxyCODONE, senna-docusate, tetrahydrozoline-zinc  Diet:  Renal thin liquids DVT Prophylaxis:  Lovenox 30 mg sq daily   CLINICALLY SIGNIFICANT STUDIES Basic Metabolic Panel:  Recent Labs Lab 02/12/13 1354 02/13/13 0605  NA 136 136  K 3.5 3.7  CL 93* 94*  CO2 32 32  GLUCOSE 135* 73  BUN 12 17  CREATININE 4.34* 5.57*  CALCIUM 8.5 8.7  MG 2.3  --   PHOS 3.1  --    Liver Function Tests:  Recent Labs Lab 02/13/13 0605  AST 21  ALT 19  ALKPHOS 91  BILITOT 0.6  PROT 7.9  ALBUMIN 2.4*   CBC:  Recent Labs Lab 02/12/13 1354 02/13/13 0605  WBC 9.4 10.8*  NEUTROABS 7.4 8.1*  HGB 14.3 13.8  HCT 43.4 42.1  MCV 85.3 85.4  PLT 141* 138*   Coagulation:  Recent Labs Lab 02/12/13 1907  LABPROT 16.5*  INR 1.37   Cardiac Enzymes:  Recent Labs Lab 02/12/13 1354 02/12/13 1625  TROPONINI <0.30 <0.30   Urinalysis: No results found for this basename: COLORURINE, APPERANCEUR, LABSPEC, PHURINE, GLUCOSEU, HGBUR, BILIRUBINUR, KETONESUR, PROTEINUR, UROBILINOGEN, NITRITE, LEUKOCYTESUR,  in the last 168 hours Lipid Panel    Component Value Date/Time   CHOL 130 02/13/2013 0605   TRIG 140 02/13/2013 0605  HDL 25* 02/13/2013 0605   CHOLHDL 5.2 02/13/2013 0605   VLDL 28 02/13/2013 0605   LDLCALC 77 02/13/2013 0605   HgbA1C  Lab Results  Component Value Date   HGBA1C 5.8* 02/03/2013    Urine Drug Screen:   No results found for this basename: labopia, cocainscrnur, labbenz, amphetmu, thcu, labbarb    Alcohol Level:  Recent Labs Lab 02/12/13 1907  ETH <11   CT of the brain  02/12/2013   No acute intracranial finding.   MRI of the brain  02/13/2013   No acute infarct.    MRA of the brain  02/13/2013   Motion degraded exam.  This limits evaluating for detection of an aneurysm or grading stenosis accurately.  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  Moderate narrowing cavernous segment internal carotid  artery greater on the right.  Poor delineation right middle cerebral artery branches.  Moderate middle cerebral artery branch vessel irregularity bilaterally.  Ectatic vertebral arteries and basilar artery.  Mild narrowing of portions of the distal vertebral arteries and proximal basilar artery.  Poor delineation PICAs and AICAs.  Prominent narrowing and irregularity superior cerebellar artery and posterior cerebral artery.  Aneurysm at the origin of the left superior cerebellar artery not excluded although appearance may be related to the motion artifact.    CXR  02/13/2013    No acute cardiopulmonary disease.  Therapy Recommendations HH PT  Physical Exam  Awake alert. Afebrile. Head is nontraumatic. Neck is supple without bruit. Hearing is normal. Cardiac exam no murmur or gallop. Lungs are clear to auscultation. Distal pulses are well felt.pedal edema present 1+ Neurological Exam : ;  Awake  Alert oriented x 3. Normal speech and language.eye diminished attention, registration and recall.movements full without nystagmus.fundi were not visualized. Vision acuity and fields appear normal. Hearing is normal. Palatal movements are normal. Face symmetric. Tongue midline. Normal strength, tone, reflexes and coordination. Normal sensation. Gait deferred.bilateral asterixis present ASSESSMENT [name] is a 57 y.o. female presenting with syncopal episode following hemodialysis with transient confusion and speech difficulty in the ED.  Imaging confirms no acute infarct. Dx:  Post hemodialysis dysequilibrium and associated delirium. No acute stroke or TIA. (patient reports tremors/shakes routinely after HD due to fluid level changes). On aspirin 325 mg orally every day prior to admission. Now on clopidogrel 75 mg orally every day for secondary stroke prevention.   Diabetes ESRD on HD obstructive sleep apnea Ischemic cardiomyopathy 25-30% 2011 Hyperlipidemia, LDL 77, on lipitor 10 PTA, now on lipitor  10, goal LDL < 100 (< 70 for diabetics) Hypertension PVD systemic lupus Family hx of stroke (brother)  Hospital day # 1  TREATMENT/PLAN  Continue aspirin 325 mg orally every day for secondary stroke prevention. No need to change to plavix as episode is not stroke/TIA related  No further stroke workup indicated - will cancel 2D and carotid dopplers Stroke Service will sign off. Please call should any needs arise. No stroke follow up indicated  Annie Main, MSN, RN, ANVP-BC, ANP-BC, [facility] Pager: [phone] 02/13/2013 10:43 AM  I have personally obtained a history, examined the patient, evaluated imaging results, and formulated the assessment and plan of care. I agree with the above. Delia Heady, MD

## 2013-02-13 NOTE — Progress Notes (Signed)
Pt discharged home accompanied by family, reviewed discharge instructions and medications, answered any questions.  Brooklyne Radke Charity fundraiser.SCRN

## 2013-02-13 NOTE — Discharge Summary (Addendum)
Physician Discharge Summary  [name] VHQ:[id number] DOB: 10-26-1955 DOA: 02/12/2013  PCP: Katy Apo, MD [hasn't seen him in 4 yrs so unassinged]  Admit date: 02/12/2013 Discharge date: 02/13/2013  Time spent: 25 minutes  Recommendations for Outpatient Follow-up:  1. Followup see her regular physician for further recommendations-he may need lower amount of fluid pulled off by dialysis 2. May need further workup for asterixis-could be secondary to uremia in that case need increasing ultrafiltration 3. Continue Santyl ointment ordered prior to admission by VVS team 4. 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 She had just been admitted for diabetic foot ulcer and was started on Plavix and is scheduled to have this next week It was thought initially that some of her confusion may been related to either infection or stroke. MRI was negative for neurology saw patient in consult and it was detmeiend this might be a TIA  Will followup and take her prior to admission medications and will be discharged home to followup and have further care as an outpatient ASA was changed ot plavic   Discharge Exam: Filed Vitals:   02/13/13 0220 02/13/13 0404 02/13/13 0620 02/13/13 0900  BP: 118/67 120/79 104/66 117/69  Pulse: 86  81 80 84  Temp: 98.7 F (37.1 C) 98.5 F (36.9 C) 98.7 F (37.1 C) 97.8 F (36.6 C)  TempSrc: Oral Oral Oral Oral  Resp: 20 20 18 18   Height:      Weight:      SpO2: 100% 98% 99% 95%   Alert pleasant oriented no apparent distress General: No specific deficit Cardiovascular: S1-S2 no murmur rub or gallop Respiratory: Clinically clear  Discharge Instructions  Discharge Orders   Future Appointments Provider Department Dept Phone   05/07/2013 12:45 PM Delcie Roch [facility] [phone]   05/17/2013 2:00 PM Samul Dada, MD [facility] MEDICAL ONCOLOGY [phone]   05/29/2013 10:00 AM Waymon Budge, MD [facility] [phone]   10/10/2013 1:00 PM [facility] [phone]   10/10/2013 1:30 PM Chuck Hint, MD [facility] [phone]   Future Orders Complete By Expires     Diet - low sodium heart healthy  As directed     Discharge instructions  As directed     Comments:      Follow with your nephrologist and primary MD    Increase activity slowly  As directed         Medication List    STOP taking these medications       aspirin 325 MG tablet      TAKE these medications       acetaminophen 325 MG tablet  Commonly known as:  TYLENOL  Take 650 mg by mouth every 6 (six) hours as needed for pain.  atorvastatin 10 MG tablet  Commonly known as:  LIPITOR  Take 10 mg by mouth daily before breakfast.     B-complex with vitamin C tablet  Take 1 tablet by mouth daily.     cinacalcet 60 MG tablet  Commonly known as:  SENSIPAR  Take 60 mg by mouth daily before breakfast.     clopidogrel 75 MG tablet  Commonly known as:  PLAVIX  Take 1 tablet (75 mg total) by mouth daily.     collagenase ointment  Commonly known as:  SANTYL  Apply 1 application topically every other day.     ibuprofen 600 MG tablet  Commonly known as:   ADVIL,MOTRIN  Take 300 mg by mouth every 6 (six) hours as needed for pain.     insulin aspart 100 UNIT/ML injection  Commonly known as:  novoLOG  Inject 3-16 Units into the skin 3 (three) times daily before meals. Sliding scale     insulin glargine 100 UNIT/ML injection  Commonly known as:  LANTUS  Inject 12 Units into the skin at bedtime.     LYRICA 50 MG capsule  Generic drug:  pregabalin  Take 50 mg by mouth at bedtime.     midodrine 10 MG tablet  Commonly known as:  PROAMATINE  Take 10 mg by mouth 3 (three) times a week. Take on Monday, Wednesday, and Friday     oxyCODONE 5 MG immediate release tablet  Commonly known as:  Oxy IR/ROXICODONE  Take 1 tablet (5 mg total) by mouth every 8 (eight) hours as needed for pain.     sevelamer carbonate 800 MG tablet  Commonly known as:  RENVELA  Take 2,400 mg by mouth 3 (three) times daily with meals. 3 tabs by mouth three times a day     SYSTANE BALANCE OP  Place 1 drop into both eyes as needed (dry eyes). Dry eyes     tetrahydrozoline-zinc 0.05-0.25 % ophthalmic solution  Commonly known as:  VISINE-AC  Place 2 drops into both eyes 3 (three) times daily as needed (for redness).       Allergies  Allergen Reactions  . Neurontin (Gabapentin) Anaphylaxis  . Ezetimibe-Simvastatin Other (See Comments)    REACTION: face edema; itching  . Klonopin (Clonazepam) Other (See Comments)    hallucination  . Sertraline Hcl Other (See Comments)    unknown      The results of significant diagnostics from this hospitalization (including imaging, microbiology, ancillary and laboratory) are listed below for reference.    Significant Diagnostic Studies: Dg Chest 2 View  02/13/2013   *RADIOLOGY REPORT*  Clinical Data: Left-sided weakness.  CHEST - 2 VIEW  Comparison: 02/02/2013  Findings: Two views of the chest were obtained.  The lungs are clear without airspace disease.  No frank pulmonary edema.  Heart size is upper limits of normal but  unchanged.  Median sternotomy wires are present.  No significant pleural effusions.  IMPRESSION: No acute cardiopulmonary disease.   Original Report Authenticated By: Richarda Overlie, M.D.   Dg Chest 2 View  02/02/2013   *RADIOLOGY REPORT*  Clinical Data: Fever.  Open wound on the medial aspect of the left heel, likely a diabetic ulcer.  CHEST - 2 VIEW  Comparison: One-view chest x-ray 07/25/2012.  Two-view chest x-ray 08/11/2010.  Findings: Prior sternotomy CABG.  Cardiac silhouette mildly enlarged but stable.  Thoracic aorta atherosclerotic, unchanged. Hilar and mediastinal contours otherwise unremarkable.  Suboptimal inspiration accounts for crowded bronchovascular markings at the lung bases.  Lungs otherwise clear.  Pulmonary vascularity normal. Bronchovascular markings normal.  No pleural effusions.  No pneumothorax.  Degenerative changes involving the thoracic spine.  IMPRESSION: Stable cardiomegaly.  No acute cardiopulmonary disease.   Original Report Authenticated By: Hulan Saas, M.D.   Ct Head Wo Contrast  02/12/2013   *RADIOLOGY REPORT*  Clinical Data: Code strokeweakness and confusion  CT HEAD WITHOUT CONTRAST  Technique:  Contiguous axial images were obtained from the base of the skull through the vertex without contrast.  Comparison: None.  Findings: Examination is degraded by patient motion. No acute hemorrhage, acute infarction, or mass lesion is seen.  Orbits and paranasal sinuses are intact.  No skull fracture. No soft tissue abnormality.  IMPRESSION: No acute intracranial finding. These results were called by telephone on 02/12/2013 at 7:35 p.m. to Dr. Bebe Shaggy, who verbally acknowledged these results.   Original Report Authenticated By: Christiana Pellant, M.D.   Mr Brain Wo Contrast  02/13/2013   *RADIOLOGY REPORT*  Clinical Data:  Difficulty speaking.  Diabetic hypertensive hyperlipidemic patient.  MRI BRAIN WITHOUT CONTRAST MRA HEAD WITHOUT CONTRAST  Technique: Multiplanar, multiecho pulse  sequences of the brain and surrounding structures were obtained according to standard protocol without intravenous contrast.  Angiographic images of the head were obtained using MRA technique without contrast.  Comparison: 02/12/2013 CT. 03/26/2007 brain MR.  MRI HEAD  Findings:  Motion degraded exam.  No acute infarct.  No intracranial hemorrhage.  Mild global atrophy without hydrocephalus.  No intracranial mass lesion detected on this unenhanced exam.  Exophthalmos.  Heterogeneous bone marrow may be related to result of underlying anemia.  Complex opacification of the right maxillary sinus.  Polypoid lesion not excluded.  IMPRESSION: No acute infarct.  Please see above.  MRA HEAD  Findings: Motion degraded exam.  This limits evaluating for detection of an aneurysm or grading stenosis accurately.  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  Minimal bulge proximal petrous segment right internal carotid artery.  Moderate narrowing cavernous segment internal carotid artery greater on the right.  Aplastic A1 segment right anterior cerebral artery.  Poor delineation right middle cerebral artery branches.  Moderate middle cerebral artery branch vessel irregularity bilaterally.  Ectatic vertebral arteries and basilar artery.  Mild narrowing of portions of the distal vertebral arteries and proximal basilar artery.  Poor delineation PICAs and AICAs.  Prominent narrowing and irregularity superior cerebellar artery and posterior cerebral artery.  Aneurysm at the origin of the left superior cerebellar artery not excluded although appearance may be related to the motion artifact.  IMPRESSION: Motion degraded exam.  This limits evaluating for detection of an aneurysm or grading stenosis accurately.  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  Moderate narrowing cavernous segment internal carotid artery greater on the right.  Poor delineation right middle cerebral artery branches.  Moderate  middle cerebral artery branch vessel irregularity bilaterally.  Ectatic vertebral arteries and basilar artery.  Mild narrowing of portions of the distal vertebral arteries and proximal basilar artery.  Poor delineation PICAs and AICAs.  Prominent narrowing and irregularity superior cerebellar artery and posterior cerebral artery.  Aneurysm at the origin of the left superior cerebellar artery not excluded although appearance may be related to the motion artifact.   Original Report Authenticated By: Lacy Duverney, M.D.   Dg Foot 2 Views Left  02/02/2013   *RADIOLOGY REPORT*  Clinical Data: Open wound at the medial aspect of the left heel; history of diabetes.  LEFT FOOT - 2 VIEW  Comparison:  None.  Findings: There is no evidence of fracture or dislocation.  There is no evidence of osseous erosion to suggest osteomyelitis.  The joint spaces are preserved.  There is no evidence of talar subluxation; the subtalar joint is unremarkable in appearance.  A soft tissue ulceration is noted overlying the posterior calcaneus on the lateral view.  No underlying foreign bodies are seen. Diffuse vascular calcifications are noted within the soft tissues.  IMPRESSION:  1.  No evidence of fracture or dislocation; no evidence of osseous erosion to suggest osteomyelitis. 2.  Soft tissue ulceration noted overlying the posterior calcaneus on the lateral view; diffuse vascular calcifications seen.   Original Report Authenticated By: Tonia Ghent, M.D.   Mr Mra Head/brain Wo Cm  02/13/2013   *RADIOLOGY REPORT*  Clinical Data:  Difficulty speaking.  Diabetic hypertensive hyperlipidemic patient.  MRI BRAIN WITHOUT CONTRAST MRA HEAD WITHOUT CONTRAST  Technique: Multiplanar, multiecho pulse sequences of the brain and surrounding structures were obtained according to standard protocol without intravenous contrast.  Angiographic images of the head were obtained using MRA technique without contrast.  Comparison: 02/12/2013 CT. 03/26/2007  brain MR.  MRI HEAD  Findings:  Motion degraded exam.  No acute infarct.  No intracranial hemorrhage.  Mild global atrophy without hydrocephalus.  No intracranial mass lesion detected on this unenhanced exam.  Exophthalmos.  Heterogeneous bone marrow may be related to result of underlying anemia.  Complex opacification of the right maxillary sinus.  Polypoid lesion not excluded.  IMPRESSION: No acute infarct.  Please see above.  MRA HEAD  Findings: Motion degraded exam.  This limits evaluating for detection of an aneurysm or grading stenosis accurately.  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  Minimal bulge proximal petrous segment right internal carotid artery.  Moderate narrowing cavernous segment internal carotid artery greater on the right.  Aplastic A1 segment right anterior cerebral artery.  Poor delineation right middle cerebral artery branches.  Moderate middle cerebral artery branch vessel irregularity bilaterally.  Ectatic vertebral arteries and basilar artery.  Mild narrowing of portions of the distal vertebral arteries and proximal basilar artery.  Poor delineation PICAs and AICAs.  Prominent narrowing and irregularity superior cerebellar artery and posterior cerebral artery.  Aneurysm at the origin of the left superior cerebellar artery not excluded although appearance may be related to the motion artifact.  IMPRESSION: Motion degraded exam.  This limits evaluating for detection of an aneurysm or grading stenosis accurately.  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  Moderate narrowing cavernous segment internal carotid artery greater on the right.  Poor delineation right middle cerebral artery branches.  Moderate middle cerebral artery branch vessel irregularity bilaterally.  Ectatic vertebral arteries and basilar artery.  Mild narrowing of portions of the distal vertebral arteries and proximal basilar artery.  Poor delineation PICAs and AICAs.  Prominent  narrowing and irregularity superior cerebellar artery and posterior cerebral artery.  Aneurysm at the origin of the left superior cerebellar artery not excluded although appearance may be related to the motion artifact.   Original Report Authenticated By: Lacy Duverney, M.D.    Microbiology: No results found for this or any previous visit (from the past 240 hour(s)).   Labs: Basic Metabolic Panel:  Recent Labs Lab 02/12/13 1354 02/13/13 0605  NA 136 136  K 3.5 3.7  CL 93* 94*  CO2 32 32  GLUCOSE 135* 73  BUN 12 17  CREATININE 4.34* 5.57*  CALCIUM 8.5 8.7  MG 2.3  --  PHOS 3.1  --    Liver Function Tests:  Recent Labs Lab 02/13/13 0605  AST 21  ALT 19  ALKPHOS 91  BILITOT 0.6  PROT 7.9  ALBUMIN 2.4*   No results found for this basename: LIPASE, AMYLASE,  in the last 168 hours No results found for this basename: AMMONIA,  in the last 168 hours CBC:  Recent Labs Lab 02/12/13 1354 02/13/13 0605  WBC 9.4 10.8*  NEUTROABS 7.4 8.1*  HGB 14.3 13.8  HCT 43.4 42.1  MCV 85.3 85.4  PLT 141* 138*   Cardiac Enzymes:  Recent Labs Lab 02/12/13 1354 02/12/13 1625  TROPONINI <0.30 <0.30   BNP: BNP (last 3 results) No results found for this basename: PROBNP,  in the last 8760 hours CBG:  Recent Labs Lab 02/12/13 1815 02/12/13 2357 02/13/13 0627 02/13/13 1115  GLUCAP 99 108* 79 85       Signed:  Zanna Hawn, JAI-GURMUKH  [facility] 02/13/2013, 12:46 PM

## 2013-02-19 ENCOUNTER — Encounter (HOSPITAL_COMMUNITY)
Admission: RE | Admit: 2013-02-19 | Discharge: 2013-02-19 | Disposition: A | Discharge status: Home / Self Care | Payer: Medicare Other | Source: Ambulatory Visit | Attending: Vascular Surgery | Admitting: Vascular Surgery

## 2013-02-19 ENCOUNTER — Encounter (HOSPITAL_COMMUNITY): Payer: Self-pay

## 2013-02-19 HISTORY — DX: Type 2 diabetes mellitus with foot ulcer: E11.621

## 2013-02-19 HISTORY — DX: Shortness of breath: R06.02

## 2013-02-19 HISTORY — DX: Dependence on renal dialysis: Z99.2

## 2013-02-19 HISTORY — DX: Transient cerebral ischemic attack, unspecified: G45.9

## 2013-02-19 HISTORY — DX: Non-pressure chronic ulcer of unspecified heel and midfoot with unspecified severity: L97.409

## 2013-02-19 LAB — COMPREHENSIVE METABOLIC PANEL
AST: 23 U/L (ref 0–37)
Albumin: 2.7 g/dL — ABNORMAL LOW (ref 3.5–5.2)
Alkaline Phosphatase: 96 U/L (ref 39–117)
BUN: 13 mg/dL (ref 6–23)
Chloride: 93 mEq/L — ABNORMAL LOW (ref 96–112)
Creatinine, Ser: 4.28 mg/dL — ABNORMAL HIGH (ref 0.50–1.10)
Potassium: 3.4 mEq/L — ABNORMAL LOW (ref 3.5–5.1)
Total Protein: 8.3 g/dL (ref 6.0–8.3)

## 2013-02-19 LAB — APTT: aPTT: 34 seconds (ref 24–37)

## 2013-02-19 LAB — CBC
HCT: 42.5 % (ref 36.0–46.0)
MCHC: 32.2 g/dL (ref 30.0–36.0)
Platelets: 131 10*3/uL — ABNORMAL LOW (ref 150–400)
RDW: 15.1 % (ref 11.5–15.5)
WBC: 6.8 10*3/uL (ref 4.0–10.5)

## 2013-02-19 LAB — SURGICAL PCR SCREEN: MRSA, PCR: NEGATIVE

## 2013-02-19 LAB — PROTIME-INR
INR: 1.29 (ref 0.00–1.49)
Prothrombin Time: 15.8 seconds — ABNORMAL HIGH (ref 11.6–15.2)

## 2013-02-19 MED ORDER — DEXTROSE 5 % IV SOLN
1.5000 g | INTRAVENOUS | Status: AC
  Administered 2013-02-20: 1.5 g via INTRAVENOUS
  Filled 2013-02-19: qty 1.5

## 2013-02-19 NOTE — Progress Notes (Signed)
Anesthesia Chart Review:  Patient is a 57 year old female scheduled for left FPBG on 02/1013 by Dr. Edilia Bo.  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  Cardiologist is Dr. Verdis Prime.  He cleared her for surgery with moderate to high risk following an intermediate risk nuclear stress test on 02/15/13 (see below).  Nuclear stress test on 02/15/13 showed evidence of moderate ischemia in the basal inferolateral, basal anterolateral and mid inferolateral regions.  Moderate perfusion defect in the basal anterior, basal anteroseptal and mid anterior regions.  This is consistent with infarct/scar.  EF 50%.  Hypo to akinesis consistent with the above noted pattern of infarction.  This was reviewed by Dr. Verdis Prime who felt that in the absence of ischemic symptoms or changes since her last visit on 01/15/13 that no further cardiac work-up would be recommended prior to her vascular surgery.  Currently, the last echo available is from 03/24/10 and showed: - Left ventricle: The cavity size was normal. Systolic function was mildly reduced. The estimated ejection fraction was in the range of 45% to 50%. Wall motion was normal; there were no regional wall motion abnormalities. Doppler parameters are consistent with abnormal left ventricular relaxation (grade 1 diastolic dysfunction). - Ventricular septum: The contour showed diastolic flattening. These changes are consistent with right ventricular pacing. - Mitral valve: Calcified annulus. Mild regurgitation. - Left atrium: The atrium was mildly dilated. - Right ventricle: The cavity size was moderately dilated. Wall thickness was normal. Systolic function was moderately reduced.  EKG on 02/02/13 (Epic) and 02/15/13 Deboraha Sprang) showed SR, first degree AVB, anterior infarct (old).  CXR on 02/13/13 showed  no acute cardiopulmonary disease.  Preoperative labs noted.  She was not able to provide a urine specimen.  She needs an ISTAT on arrival due to ESRD.  Velna [facility]/Anesthesiology Phone [phone] 02/19/2013 5:16 PM

## 2013-02-19 NOTE — Progress Notes (Signed)
Patient informed Nurse that she had a CABG and cardiologist is Dr. Verdis Prime. Patient confirmed to having sleep apnea but denied wearing the CPAP machine nightly. Due to dialysis patient is unable to provide urine specimen.

## 2013-02-20 ENCOUNTER — Encounter (HOSPITAL_COMMUNITY)
Admission: RE | Disposition: A | Discharge status: Home Health | Payer: Self-pay | Source: Ambulatory Visit | Attending: Vascular Surgery

## 2013-02-20 ENCOUNTER — Encounter: Payer: Self-pay | Admitting: Interventional Cardiology

## 2013-02-20 ENCOUNTER — Inpatient Hospital Stay (HOSPITAL_COMMUNITY): Payer: Medicare Other

## 2013-02-20 ENCOUNTER — Inpatient Hospital Stay (HOSPITAL_COMMUNITY): Payer: Medicare Other | Admitting: Anesthesiology

## 2013-02-20 ENCOUNTER — Encounter (HOSPITAL_COMMUNITY): Payer: Self-pay | Admitting: Anesthesiology

## 2013-02-20 ENCOUNTER — Inpatient Hospital Stay (HOSPITAL_COMMUNITY)
Admission: RE | Admit: 2013-02-20 | Discharge: 2013-02-24 | DRG: 252 | Disposition: A | Discharge status: Home Health | Payer: Medicare Other | Source: Ambulatory Visit | Attending: Vascular Surgery | Admitting: Vascular Surgery

## 2013-02-20 ENCOUNTER — Encounter (HOSPITAL_COMMUNITY): Payer: Self-pay | Admitting: *Deleted

## 2013-02-20 DIAGNOSIS — S88119A Complete traumatic amputation at level between knee and ankle, unspecified lower leg, initial encounter: Secondary | ICD-10-CM

## 2013-02-20 DIAGNOSIS — I499 Cardiac arrhythmia, unspecified: Secondary | ICD-10-CM

## 2013-02-20 DIAGNOSIS — N186 End stage renal disease: Secondary | ICD-10-CM | POA: Diagnosis present

## 2013-02-20 DIAGNOSIS — E079 Disorder of thyroid, unspecified: Secondary | ICD-10-CM | POA: Diagnosis present

## 2013-02-20 DIAGNOSIS — I70219 Atherosclerosis of native arteries of extremities with intermittent claudication, unspecified extremity: Secondary | ICD-10-CM

## 2013-02-20 DIAGNOSIS — Z01812 Encounter for preprocedural laboratory examination: Secondary | ICD-10-CM

## 2013-02-20 DIAGNOSIS — Z8673 Personal history of transient ischemic attack (TIA), and cerebral infarction without residual deficits: Secondary | ICD-10-CM

## 2013-02-20 DIAGNOSIS — D631 Anemia in chronic kidney disease: Secondary | ICD-10-CM | POA: Diagnosis present

## 2013-02-20 DIAGNOSIS — E1129 Type 2 diabetes mellitus with other diabetic kidney complication: Secondary | ICD-10-CM | POA: Diagnosis present

## 2013-02-20 DIAGNOSIS — G4733 Obstructive sleep apnea (adult) (pediatric): Secondary | ICD-10-CM | POA: Diagnosis present

## 2013-02-20 DIAGNOSIS — L98499 Non-pressure chronic ulcer of skin of other sites with unspecified severity: Secondary | ICD-10-CM

## 2013-02-20 DIAGNOSIS — I2510 Atherosclerotic heart disease of native coronary artery without angina pectoris: Secondary | ICD-10-CM

## 2013-02-20 DIAGNOSIS — E785 Hyperlipidemia, unspecified: Secondary | ICD-10-CM | POA: Diagnosis present

## 2013-02-20 DIAGNOSIS — Z951 Presence of aortocoronary bypass graft: Secondary | ICD-10-CM

## 2013-02-20 DIAGNOSIS — Z992 Dependence on renal dialysis: Secondary | ICD-10-CM

## 2013-02-20 DIAGNOSIS — I5042 Chronic combined systolic (congestive) and diastolic (congestive) heart failure: Secondary | ICD-10-CM | POA: Diagnosis present

## 2013-02-20 DIAGNOSIS — N2581 Secondary hyperparathyroidism of renal origin: Secondary | ICD-10-CM | POA: Diagnosis present

## 2013-02-20 DIAGNOSIS — Z794 Long term (current) use of insulin: Secondary | ICD-10-CM

## 2013-02-20 DIAGNOSIS — D472 Monoclonal gammopathy: Secondary | ICD-10-CM | POA: Diagnosis present

## 2013-02-20 DIAGNOSIS — I959 Hypotension, unspecified: Secondary | ICD-10-CM | POA: Diagnosis not present

## 2013-02-20 DIAGNOSIS — I255 Ischemic cardiomyopathy: Secondary | ICD-10-CM | POA: Diagnosis present

## 2013-02-20 DIAGNOSIS — I739 Peripheral vascular disease, unspecified: Secondary | ICD-10-CM

## 2013-02-20 DIAGNOSIS — Z823 Family history of stroke: Secondary | ICD-10-CM

## 2013-02-20 DIAGNOSIS — M329 Systemic lupus erythematosus, unspecified: Secondary | ICD-10-CM | POA: Diagnosis present

## 2013-02-20 DIAGNOSIS — M949 Disorder of cartilage, unspecified: Secondary | ICD-10-CM | POA: Diagnosis present

## 2013-02-20 DIAGNOSIS — L97409 Non-pressure chronic ulcer of unspecified heel and midfoot with unspecified severity: Secondary | ICD-10-CM | POA: Diagnosis present

## 2013-02-20 DIAGNOSIS — I509 Heart failure, unspecified: Secondary | ICD-10-CM | POA: Diagnosis present

## 2013-02-20 DIAGNOSIS — I70269 Atherosclerosis of native arteries of extremities with gangrene, unspecified extremity: Secondary | ICD-10-CM | POA: Diagnosis present

## 2013-02-20 DIAGNOSIS — I120 Hypertensive chronic kidney disease with stage 5 chronic kidney disease or end stage renal disease: Secondary | ICD-10-CM | POA: Diagnosis present

## 2013-02-20 DIAGNOSIS — E1159 Type 2 diabetes mellitus with other circulatory complications: Principal | ICD-10-CM | POA: Diagnosis present

## 2013-02-20 DIAGNOSIS — I2589 Other forms of chronic ischemic heart disease: Secondary | ICD-10-CM | POA: Diagnosis present

## 2013-02-20 DIAGNOSIS — I70209 Unspecified atherosclerosis of native arteries of extremities, unspecified extremity: Secondary | ICD-10-CM

## 2013-02-20 DIAGNOSIS — I2789 Other specified pulmonary heart diseases: Secondary | ICD-10-CM | POA: Diagnosis present

## 2013-02-20 DIAGNOSIS — E1351 Other specified diabetes mellitus with diabetic peripheral angiopathy without gangrene: Secondary | ICD-10-CM | POA: Diagnosis present

## 2013-02-20 DIAGNOSIS — Z7902 Long term (current) use of antithrombotics/antiplatelets: Secondary | ICD-10-CM

## 2013-02-20 DIAGNOSIS — Z79899 Other long term (current) drug therapy: Secondary | ICD-10-CM

## 2013-02-20 DIAGNOSIS — Z8249 Family history of ischemic heart disease and other diseases of the circulatory system: Secondary | ICD-10-CM

## 2013-02-20 DIAGNOSIS — I10 Essential (primary) hypertension: Secondary | ICD-10-CM | POA: Diagnosis present

## 2013-02-20 DIAGNOSIS — Z888 Allergy status to other drugs, medicaments and biological substances status: Secondary | ICD-10-CM

## 2013-02-20 DIAGNOSIS — R7989 Other specified abnormal findings of blood chemistry: Secondary | ICD-10-CM

## 2013-02-20 DIAGNOSIS — R000 Tachycardia, unspecified: Secondary | ICD-10-CM | POA: Diagnosis not present

## 2013-02-20 DIAGNOSIS — M899 Disorder of bone, unspecified: Secondary | ICD-10-CM | POA: Diagnosis present

## 2013-02-20 HISTORY — PX: FEMORAL-POPLITEAL BYPASS GRAFT: SHX937

## 2013-02-20 LAB — CREATININE, SERUM
Creatinine, Ser: 5.49 mg/dL — ABNORMAL HIGH (ref 0.50–1.10)
GFR calc Af Amer: 9 mL/min — ABNORMAL LOW (ref >90)
GFR calc non Af Amer: 8 mL/min — ABNORMAL LOW (ref >90)

## 2013-02-20 LAB — GLUCOSE, CAPILLARY
Glucose-Capillary: 108 mg/dL — ABNORMAL HIGH (ref 70–99)
Glucose-Capillary: 115 mg/dL — ABNORMAL HIGH (ref 70–99)

## 2013-02-20 LAB — CBC
HCT: 36.2 % (ref 36.0–46.0)
MCHC: 32.6 g/dL (ref 30.0–36.0)
Platelets: 129 10*3/uL — ABNORMAL LOW (ref 150–400)
RDW: 15.4 % (ref 11.5–15.5)
WBC: 11.6 10*3/uL — ABNORMAL HIGH (ref 4.0–10.5)

## 2013-02-20 LAB — POCT I-STAT 4, (NA,K, GLUC, HGB,HCT)
HCT: 44 % (ref 36.0–46.0)
Hemoglobin: 15 g/dL (ref 12.0–15.0)

## 2013-02-20 SURGERY — BYPASS GRAFT FEMORAL-POPLITEAL ARTERY
Anesthesia: General | Site: Leg Upper | Laterality: Left | Wound class: Clean

## 2013-02-20 MED ORDER — PHENYLEPHRINE HCL 10 MG/ML IJ SOLN
10.0000 mg | INTRAVENOUS | Status: DC | PRN
  Administered 2013-02-20: 25 ug/min via INTRAVENOUS

## 2013-02-20 MED ORDER — OXYCODONE HCL 5 MG/5ML PO SOLN: 5.0000 mg | Freq: Once | ORAL | Status: DC | PRN

## 2013-02-20 MED ORDER — METOCLOPRAMIDE HCL 5 MG/ML IJ SOLN: 10.0000 mg | Freq: Once | INTRAMUSCULAR | Status: DC | PRN

## 2013-02-20 MED ORDER — HYDRALAZINE HCL 20 MG/ML IJ SOLN: 10.0000 mg | INTRAMUSCULAR | Status: DC | PRN

## 2013-02-20 MED ORDER — PREGABALIN 50 MG PO CAPS
50.0000 mg | ORAL_CAPSULE | Freq: Every day | ORAL | Status: DC
  Administered 2013-02-21 – 2013-02-23 (×3): 50 mg via ORAL
  Filled 2013-02-20 (×4): qty 1

## 2013-02-20 MED ORDER — NEPRO/CARBSTEADY PO LIQD
237.0000 mL | ORAL | Status: DC | PRN
  Filled 2013-02-20: qty 237

## 2013-02-20 MED ORDER — OXYCODONE HCL 5 MG PO TABS: 5.0000 mg | ORAL_TABLET | Freq: Once | ORAL | Status: DC | PRN

## 2013-02-20 MED ORDER — ACETAMINOPHEN 650 MG RE SUPP: 325.0000 mg | RECTAL | Status: DC | PRN

## 2013-02-20 MED ORDER — MIDODRINE HCL 5 MG PO TABS
10.0000 mg | ORAL_TABLET | ORAL | Status: AC
  Administered 2013-02-21: 10 mg via ORAL
  Filled 2013-02-20: qty 2

## 2013-02-20 MED ORDER — SEVELAMER CARBONATE 800 MG PO TABS
2400.0000 mg | ORAL_TABLET | Freq: Three times a day (TID) | ORAL | Status: DC
  Administered 2013-02-22 – 2013-02-24 (×6): 2400 mg via ORAL
  Filled 2013-02-20 (×14): qty 3

## 2013-02-20 MED ORDER — ONDANSETRON HCL 4 MG/2ML IJ SOLN: 4.0000 mg | Freq: Four times a day (QID) | INTRAMUSCULAR | Status: DC | PRN

## 2013-02-20 MED ORDER — HYDROMORPHONE HCL PF 1 MG/ML IJ SOLN
0.2500 mg | INTRAMUSCULAR | Status: DC | PRN
  Administered 2013-02-20 (×2): 0.5 mg via INTRAVENOUS

## 2013-02-20 MED ORDER — THROMBIN 20000 UNITS EX SOLR
CUTANEOUS | Status: AC
  Filled 2013-02-20: qty 20000

## 2013-02-20 MED ORDER — ATORVASTATIN CALCIUM 10 MG PO TABS
10.0000 mg | ORAL_TABLET | Freq: Every day | ORAL | Status: DC
  Administered 2013-02-21 – 2013-02-23 (×2): 10 mg via ORAL
  Filled 2013-02-20 (×5): qty 1

## 2013-02-20 MED ORDER — MIDODRINE HCL 5 MG PO TABS
10.0000 mg | ORAL_TABLET | ORAL | Status: DC
  Administered 2013-02-23: 10 mg via ORAL
  Filled 2013-02-20 (×3): qty 2

## 2013-02-20 MED ORDER — DOCUSATE SODIUM 100 MG PO CAPS
100.0000 mg | ORAL_CAPSULE | Freq: Every day | ORAL | Status: DC
  Administered 2013-02-21 – 2013-02-24 (×4): 100 mg via ORAL
  Filled 2013-02-20 (×4): qty 1

## 2013-02-20 MED ORDER — ENOXAPARIN SODIUM 30 MG/0.3ML ~~LOC~~ SOLN
30.0000 mg | SUBCUTANEOUS | Status: DC
  Filled 2013-02-20: qty 0.3

## 2013-02-20 MED ORDER — SODIUM CHLORIDE 0.9 % IR SOLN
Status: DC | PRN
  Administered 2013-02-20: 09:00:00

## 2013-02-20 MED ORDER — DOXERCALCIFEROL 4 MCG/2ML IV SOLN
2.0000 ug | INTRAVENOUS | Status: DC
  Administered 2013-02-23: 2 ug via INTRAVENOUS
  Filled 2013-02-20 (×2): qty 2

## 2013-02-20 MED ORDER — GLYCOPYRROLATE 0.2 MG/ML IJ SOLN
INTRAMUSCULAR | Status: DC | PRN
  Administered 2013-02-20: 0.2 mg via INTRAVENOUS

## 2013-02-20 MED ORDER — DEXTROSE 5 % IV SOLN
1.5000 g | Freq: Two times a day (BID) | INTRAVENOUS | Status: AC
  Administered 2013-02-20 – 2013-02-21 (×2): 1.5 g via INTRAVENOUS
  Filled 2013-02-20 (×2): qty 1.5

## 2013-02-20 MED ORDER — PROPYLENE GLYCOL 0.6 % OP SOLN: 1.0000 [drp] | OPHTHALMIC | Status: DC | PRN

## 2013-02-20 MED ORDER — HYDROMORPHONE HCL PF 1 MG/ML IJ SOLN
INTRAMUSCULAR | Status: AC
  Filled 2013-02-20: qty 1

## 2013-02-20 MED ORDER — PHENOL 1.4 % MT LIQD: 1.0000 | OROMUCOSAL | Status: DC | PRN

## 2013-02-20 MED ORDER — MIDAZOLAM HCL 5 MG/5ML IJ SOLN
INTRAMUSCULAR | Status: DC | PRN
  Administered 2013-02-20: 2 mg via INTRAVENOUS

## 2013-02-20 MED ORDER — ONDANSETRON HCL 4 MG/2ML IJ SOLN
INTRAMUSCULAR | Status: DC | PRN
  Administered 2013-02-20: 4 mg via INTRAVENOUS

## 2013-02-20 MED ORDER — NEOSTIGMINE METHYLSULFATE 1 MG/ML IJ SOLN
INTRAMUSCULAR | Status: DC | PRN
  Administered 2013-02-20: 2 mg via INTRAVENOUS

## 2013-02-20 MED ORDER — PROPOFOL 10 MG/ML IV BOLUS
INTRAVENOUS | Status: DC | PRN
  Administered 2013-02-20: 50 mg via INTRAVENOUS
  Administered 2013-02-20: 200 mg via INTRAVENOUS

## 2013-02-20 MED ORDER — METOPROLOL TARTRATE 1 MG/ML IV SOLN
2.0000 mg | INTRAVENOUS | Status: DC | PRN
  Filled 2013-02-20: qty 5

## 2013-02-20 MED ORDER — SODIUM CHLORIDE 0.9 % IV SOLN: 100.0000 mL | INTRAVENOUS | Status: DC | PRN

## 2013-02-20 MED ORDER — SODIUM CHLORIDE 0.9 % IV SOLN
100.0000 mL | INTRAVENOUS | Status: DC | PRN
  Administered 2013-02-22: 999 mL via INTRAVENOUS

## 2013-02-20 MED ORDER — MORPHINE SULFATE 2 MG/ML IJ SOLN
2.0000 mg | INTRAMUSCULAR | Status: DC | PRN
  Administered 2013-02-20: 2 mg via INTRAVENOUS
  Administered 2013-02-20 – 2013-02-21 (×2): 4 mg via INTRAVENOUS
  Administered 2013-02-21 (×2): 2 mg via INTRAVENOUS
  Filled 2013-02-20: qty 1
  Filled 2013-02-20 (×4): qty 2
  Filled 2013-02-20 (×2): qty 1

## 2013-02-20 MED ORDER — LIDOCAINE HCL (PF) 1 % IJ SOLN: 5.0000 mL | INTRAMUSCULAR | Status: DC | PRN

## 2013-02-20 MED ORDER — PHENYLEPHRINE HCL 10 MG/ML IJ SOLN
20.0000 mg | INTRAVENOUS | Status: DC | PRN
  Administered 2013-02-20: 25 ug/min via INTRAVENOUS

## 2013-02-20 MED ORDER — SEVELAMER CARBONATE 800 MG PO TABS
2400.0000 mg | ORAL_TABLET | Freq: Three times a day (TID) | ORAL | Status: DC
  Filled 2013-02-20 (×4): qty 3

## 2013-02-20 MED ORDER — ALBUMIN HUMAN 5 % IV SOLN
INTRAVENOUS | Status: AC
  Administered 2013-02-20: 12.5 g
  Filled 2013-02-20: qty 250

## 2013-02-20 MED ORDER — LIDOCAINE-PRILOCAINE 2.5-2.5 % EX CREA
1.0000 "application " | TOPICAL_CREAM | CUTANEOUS | Status: DC | PRN
  Filled 2013-02-20: qty 5

## 2013-02-20 MED ORDER — RENA-VITE PO TABS
1.0000 | ORAL_TABLET | Freq: Every day | ORAL | Status: DC
  Administered 2013-02-21 – 2013-02-24 (×4): 1 via ORAL
  Filled 2013-02-20 (×5): qty 1

## 2013-02-20 MED ORDER — HEPARIN SODIUM (PORCINE) 1000 UNIT/ML DIALYSIS
1000.0000 [IU] | INTRAMUSCULAR | Status: DC | PRN
  Filled 2013-02-20: qty 1

## 2013-02-20 MED ORDER — PAPAVERINE HCL 30 MG/ML IJ SOLN
INTRAMUSCULAR | Status: AC
  Filled 2013-02-20: qty 2

## 2013-02-20 MED ORDER — PHENYLEPHRINE HCL 10 MG/ML IJ SOLN
INTRAMUSCULAR | Status: DC | PRN
  Administered 2013-02-20: 80 ug via INTRAVENOUS
  Administered 2013-02-20: 40 ug via INTRAVENOUS
  Administered 2013-02-20 (×2): 80 ug via INTRAVENOUS

## 2013-02-20 MED ORDER — PROTAMINE SULFATE 10 MG/ML IV SOLN
INTRAVENOUS | Status: DC | PRN
  Administered 2013-02-20 (×3): 10 mg via INTRAVENOUS

## 2013-02-20 MED ORDER — LIDOCAINE HCL (CARDIAC) 20 MG/ML IV SOLN
INTRAVENOUS | Status: DC | PRN
  Administered 2013-02-20 (×2): 50 mg via INTRAVENOUS

## 2013-02-20 MED ORDER — SODIUM CHLORIDE 0.9 % IV SOLN
500.0000 mL | Freq: Once | INTRAVENOUS | Status: AC | PRN
  Administered 2013-02-20: 500 mL via INTRAVENOUS

## 2013-02-20 MED ORDER — ACETAMINOPHEN 325 MG PO TABS
325.0000 mg | ORAL_TABLET | ORAL | Status: DC | PRN
  Administered 2013-02-21: 650 mg via ORAL
  Filled 2013-02-20: qty 2

## 2013-02-20 MED ORDER — INSULIN GLARGINE 100 UNIT/ML ~~LOC~~ SOLN
12.0000 [IU] | Freq: Every day | SUBCUTANEOUS | Status: DC
  Administered 2013-02-20 – 2013-02-23 (×4): 12 [IU] via SUBCUTANEOUS
  Filled 2013-02-20 (×5): qty 0.12

## 2013-02-20 MED ORDER — SODIUM CHLORIDE 0.9 % IV SOLN
INTRAVENOUS | Status: DC
  Administered 2013-02-20 (×2): via INTRAVENOUS

## 2013-02-20 MED ORDER — CINACALCET HCL 30 MG PO TABS
60.0000 mg | ORAL_TABLET | Freq: Every day | ORAL | Status: DC
  Administered 2013-02-22 – 2013-02-24 (×3): 60 mg via ORAL
  Filled 2013-02-20 (×6): qty 2

## 2013-02-20 MED ORDER — GUAIFENESIN-DM 100-10 MG/5ML PO SYRP
15.0000 mL | ORAL_SOLUTION | ORAL | Status: DC | PRN
  Filled 2013-02-20: qty 15

## 2013-02-20 MED ORDER — INSULIN ASPART 100 UNIT/ML ~~LOC~~ SOLN
0.0000 [IU] | Freq: Three times a day (TID) | SUBCUTANEOUS | Status: DC
  Administered 2013-02-23 – 2013-02-24 (×2): 1 [IU] via SUBCUTANEOUS

## 2013-02-20 MED ORDER — FENTANYL CITRATE 0.05 MG/ML IJ SOLN
INTRAMUSCULAR | Status: DC | PRN
  Administered 2013-02-20 (×2): 50 ug via INTRAVENOUS
  Administered 2013-02-20: 100 ug via INTRAVENOUS

## 2013-02-20 MED ORDER — POLYVINYL ALCOHOL 1.4 % OP SOLN
1.0000 [drp] | OPHTHALMIC | Status: DC | PRN
  Filled 2013-02-20: qty 15

## 2013-02-20 MED ORDER — OXYCODONE HCL 5 MG PO TABS
5.0000 mg | ORAL_TABLET | ORAL | Status: DC | PRN
  Administered 2013-02-20 – 2013-02-21 (×3): 10 mg via ORAL
  Filled 2013-02-20 (×3): qty 2

## 2013-02-20 MED ORDER — COLLAGENASE 250 UNIT/GM EX OINT
1.0000 "application " | TOPICAL_OINTMENT | CUTANEOUS | Status: DC
  Administered 2013-02-21: 1 via TOPICAL
  Filled 2013-02-20: qty 30

## 2013-02-20 MED ORDER — PENTAFLUOROPROP-TETRAFLUOROETH EX AERO
1.0000 | INHALATION_SPRAY | CUTANEOUS | Status: DC | PRN
Start: 2013-02-20 — End: 2013-02-22

## 2013-02-20 MED ORDER — LIDOCAINE HCL 4 % MT SOLN
OROMUCOSAL | Status: DC | PRN
  Administered 2013-02-20: 4 mL via TOPICAL

## 2013-02-20 MED ORDER — ALTEPLASE 2 MG IJ SOLR
2.0000 mg | Freq: Once | INTRAMUSCULAR | Status: AC | PRN
  Filled 2013-02-20: qty 2

## 2013-02-20 MED ORDER — CLOPIDOGREL BISULFATE 75 MG PO TABS
75.0000 mg | ORAL_TABLET | Freq: Every day | ORAL | Status: DC
  Administered 2013-02-21 – 2013-02-24 (×4): 75 mg via ORAL
  Filled 2013-02-20 (×4): qty 1

## 2013-02-20 MED ORDER — ROCURONIUM BROMIDE 100 MG/10ML IV SOLN
INTRAVENOUS | Status: DC | PRN
  Administered 2013-02-20: 50 mg via INTRAVENOUS

## 2013-02-20 MED ORDER — 0.9 % SODIUM CHLORIDE (POUR BTL) OPTIME
TOPICAL | Status: DC | PRN
  Administered 2013-02-20: 2000 mL

## 2013-02-20 MED ORDER — LABETALOL HCL 5 MG/ML IV SOLN
10.0000 mg | INTRAVENOUS | Status: DC | PRN
  Filled 2013-02-20: qty 4

## 2013-02-20 MED ORDER — HEPARIN SODIUM (PORCINE) 1000 UNIT/ML IJ SOLN
INTRAMUSCULAR | Status: DC | PRN
  Administered 2013-02-20: 8000 [IU] via INTRAVENOUS

## 2013-02-20 SURGICAL SUPPLY — 69 items
ADH SKN CLS APL DERMABOND .7 (GAUZE/BANDAGES/DRESSINGS) ×1
BANDAGE ELASTIC 4 VELCRO ST LF (GAUZE/BANDAGES/DRESSINGS) IMPLANT
BANDAGE ESMARK 6X9 LF (GAUZE/BANDAGES/DRESSINGS) IMPLANT
BNDG CMPR 9X6 STRL LF SNTH (GAUZE/BANDAGES/DRESSINGS) ×1
BNDG ESMARK 6X9 LF (GAUZE/BANDAGES/DRESSINGS) ×2
CANISTER SUCTION 2500CC (MISCELLANEOUS) ×2 IMPLANT
CANNULA VESSEL W/WING WO/VALVE (CANNULA) ×2 IMPLANT
CLIP TI MEDIUM 24 (CLIP) ×2 IMPLANT
CLIP TI WIDE RED SMALL 24 (CLIP) ×3 IMPLANT
CLOTH BEACON ORANGE TIMEOUT ST (SAFETY) ×2 IMPLANT
COVER SURGICAL LIGHT HANDLE (MISCELLANEOUS) ×2 IMPLANT
CUFF TOURNIQUET SINGLE 24IN (TOURNIQUET CUFF) IMPLANT
CUFF TOURNIQUET SINGLE 34IN LL (TOURNIQUET CUFF) ×1 IMPLANT
CUFF TOURNIQUET SINGLE 44IN (TOURNIQUET CUFF) IMPLANT
DERMABOND ADVANCED (GAUZE/BANDAGES/DRESSINGS) ×1
DERMABOND ADVANCED .7 DNX12 (GAUZE/BANDAGES/DRESSINGS) IMPLANT
DRAIN CHANNEL 15F RND FF W/TCR (WOUND CARE) IMPLANT
DRAPE WARM FLUID 44X44 (DRAPE) ×2 IMPLANT
DRAPE X-RAY CASS 24X20 (DRAPES) ×1 IMPLANT
DRSG COVADERM 4X10 (GAUZE/BANDAGES/DRESSINGS) ×1 IMPLANT
DRSG COVADERM 4X8 (GAUZE/BANDAGES/DRESSINGS) IMPLANT
ELECT REM PT RETURN 9FT ADLT (ELECTROSURGICAL) ×2
ELECTRODE REM PT RTRN 9FT ADLT (ELECTROSURGICAL) ×1 IMPLANT
EVACUATOR SILICONE 100CC (DRAIN) IMPLANT
GAUZE SPONGE 4X4 16PLY XRAY LF (GAUZE/BANDAGES/DRESSINGS) ×1 IMPLANT
GLOVE BIO SURGEON STRL SZ7.5 (GLOVE) ×2 IMPLANT
GLOVE BIOGEL PI IND STRL 6.5 (GLOVE) IMPLANT
GLOVE BIOGEL PI IND STRL 7.0 (GLOVE) IMPLANT
GLOVE BIOGEL PI IND STRL 8 (GLOVE) ×1 IMPLANT
GLOVE BIOGEL PI INDICATOR 6.5 (GLOVE) ×2
GLOVE BIOGEL PI INDICATOR 7.0 (GLOVE) ×4
GLOVE BIOGEL PI INDICATOR 8 (GLOVE) ×1
GLOVE ECLIPSE 6.5 STRL STRAW (GLOVE) ×4 IMPLANT
GLOVE SS BIOGEL STRL SZ 6.5 (GLOVE) IMPLANT
GLOVE SUPERSENSE BIOGEL SZ 6.5 (GLOVE) ×1
GOWN PREVENTION PLUS XXLARGE (GOWN DISPOSABLE) ×3 IMPLANT
GOWN STRL NON-REIN LRG LVL3 (GOWN DISPOSABLE) ×5 IMPLANT
KIT BASIN OR (CUSTOM PROCEDURE TRAY) ×2 IMPLANT
KIT ROOM TURNOVER OR (KITS) ×2 IMPLANT
MARKER GRAFT CORONARY BYPASS (MISCELLANEOUS) ×1 IMPLANT
NS IRRIG 1000ML POUR BTL (IV SOLUTION) ×4 IMPLANT
PACK PERIPHERAL VASCULAR (CUSTOM PROCEDURE TRAY) ×2 IMPLANT
PAD ARMBOARD 7.5X6 YLW CONV (MISCELLANEOUS) ×4 IMPLANT
PADDING CAST COTTON 6X4 STRL (CAST SUPPLIES) IMPLANT
SET COLLECT BLD 21X3/4 12 (NEEDLE) ×1 IMPLANT
SLEEVE SURGEON STRL (DRAPES) ×1 IMPLANT
SPONGE LAP 18X18 X RAY DECT (DISPOSABLE) ×1 IMPLANT
SPONGE SURGIFOAM ABS GEL 100 (HEMOSTASIS) IMPLANT
STAPLER VISISTAT 35W (STAPLE) IMPLANT
STOPCOCK 4 WAY LG BORE MALE ST (IV SETS) ×1 IMPLANT
SUT ETHILON 3 0 PS 1 (SUTURE) IMPLANT
SUT PROLENE 5 0 C 1 24 (SUTURE) ×3 IMPLANT
SUT PROLENE 6 0 BV (SUTURE) ×6 IMPLANT
SUT PROLENE 7 0 BV 1 (SUTURE) IMPLANT
SUT SILK 2 0 (SUTURE) ×2
SUT SILK 2 0 FS (SUTURE) ×2 IMPLANT
SUT SILK 2-0 18XBRD TIE 12 (SUTURE) IMPLANT
SUT SILK 3 0 (SUTURE) ×2
SUT SILK 3-0 18XBRD TIE 12 (SUTURE) IMPLANT
SUT VIC AB 2-0 CTB1 (SUTURE) ×5 IMPLANT
SUT VIC AB 3-0 SH 27 (SUTURE) ×10
SUT VIC AB 3-0 SH 27X BRD (SUTURE) ×2 IMPLANT
SUT VICRYL 4-0 PS2 18IN ABS (SUTURE) ×7 IMPLANT
TOWEL OR 17X24 6PK STRL BLUE (TOWEL DISPOSABLE) ×4 IMPLANT
TOWEL OR 17X26 10 PK STRL BLUE (TOWEL DISPOSABLE) ×4 IMPLANT
TRAY FOLEY CATH 14FRSI W/METER (CATHETERS) ×1 IMPLANT
TUBING EXTENTION W/L.L. (IV SETS) ×1 IMPLANT
UNDERPAD 30X30 INCONTINENT (UNDERPADS AND DIAPERS) ×2 IMPLANT
WATER STERILE IRR 1000ML POUR (IV SOLUTION) ×2 IMPLANT

## 2013-02-20 NOTE — Anesthesia Postprocedure Evaluation (Signed)
  Anesthesia Post-op Note  Patient: [name]  Procedure(s) Performed: Procedure(s) with comments:  FEMORAL-to below knee POPLITEAL ARTERY bypasswith saphenous vein with intraoperative angiogram  (Left) - fem-below knee pop  Patient Location: PACU  Anesthesia Type:General  Level of Consciousness: awake  Airway and Oxygen Therapy: Patient Spontanous Breathing  Post-op Pain: mild  Post-op Assessment: Post-op Vital signs reviewed  Post-op Vital Signs: Reviewed  Complications: No apparent anesthesia complications

## 2013-02-20 NOTE — H&P (View-Only) (Signed)
[facility] [place]  Patient name: [name] MRN: [id number] DOB: 02/26/1956 Sex: female  REASON FOR ADMISSION: nonhealing wound of the left heel.  HPI: [name] is a 57 y.o. female who has undergone previous right below the knee amputation. I saw her on consultation on 01/17/2013 with a nonhealing wound of the left heel. She had evidence of infrainguinal arterial occlusive disease and this was clearly a limb threatening problem. She has a complicated medical history including a history of end-stage renal disease on dialysis, diabetes, and  Ischemic cardiomyopathy with an ejection fraction of 25-30%.  She underwent an arteriogram by Dr. Chen appear this showed moderate to severe disease in the distal superficial femoral artery with a 90% stenosis at Hunter's canal. She had disease in the popliteal artery including it stenosis below the knee. Her dominant runoff was the anterior tibial artery. As you tibial artery was occluded distally.  She comes in today with continued issues with the wound her left foot which has not shown any significant evidence of healing. He denies fever or chills.  Past Medical History  Diagnosis Date  . Diabetes mellitus, type 2   . Kidney disease     ESRD secondary to DM, failed transplant 2006. Dialysis  . Secondary hyperparathyroidism   . OSA (obstructive sleep apnea)     NPSG 11.23.11-AHI 64.9/hr-central and obstructive apnea  . Ischemic cardiomyopathy     EF 25-30% 2011  . CAD (coronary artery disease)   . Pulmonary hypertension     Est PAsyst 60-Dr H. Smith, off coumadin, CT neg for PE  . Hyperlipidemia   . Hypertension   . Anemia   . Thyroid disease   . Pulmonary edema   . Peripheral vascular disease   . Sebaceous cyst   . 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 RowePred/plaquenil    Family History  Problem Relation Age of Onset  . Heart disease    . Edema Father     fluid overload  . Cerebral aneurysm Mother   .  Stroke Brother   . Heart disease Brother    For VQI Use Only  PRE-ADM LIVING: [X ] Home, [ ] Nursing home, [ ] Homeless  AMB STATUS: [ ] Walking, [X ] Walking w/ Assistance, [ ] Wheelchair, [ ]Bed ridden  RECENT HEART ATTACK (<6 mon): No  CAD Sx: [X ] No, [ ] Asx, h/o MI, [ ] Stable angina, [ ] Unstable angina  PRIOR CHF: [X ] No, [ ] Asx, [ ] Mild, [ ] Moderate, [ ] Severe  STRESS TEST: [X ] No, [ ] Normal, [ ] + ischemia, [ ] + MI, [ ] Both  SOCIAL HISTORY: History  Substance Use Topics  . Smoking status: Never Smoker   . Smokeless tobacco: Never Used  . Alcohol Use: No   Allergies  Allergen Reactions  . Neurontin (Gabapentin) Anaphylaxis  . Ezetimibe-Simvastatin     REACTION: face edema; itching  . Klonopin (Clonazepam) Other (See Comments)    hallucination  . Sertraline Hcl Other (See Comments)    unknown   Current Outpatient Prescriptions  Medication Sig Dispense Refill  . acetaminophen (TYLENOL) 325 MG tablet Take 650 mg by mouth every 6 (six) hours as needed.        . aspirin 325 MG tablet Take 325 mg by mouth daily.        . atorvastatin (LIPITOR) 10 MG tablet Take 10 mg by mouth daily before   breakfast.       . B Complex-C (B-COMPLEX WITH VITAMIN C) tablet Take 1 tablet by mouth daily.      . B Complex-C-Folic Acid (DIALYVITE 800 PO) Take 1 tablet by mouth daily.        . cinacalcet (SENSIPAR) 60 MG tablet Take 60 mg by mouth daily before breakfast.       . collagenase (SANTYL) ointment Apply 1 application topically every other day.      . diphenhydrAMINE (BENADRYL) 25 mg capsule Take 25 mg by mouth every 6 (six) hours as needed for itching or allergies.      . hydrOXYzine (ATARAX/VISTARIL) 10 MG tablet Take 10 mg by mouth 3 (three) times daily as needed. itching      . ibuprofen (ADVIL,MOTRIN) 600 MG tablet continuous as needed.      . insulin aspart (NOVOLOG) 100 UNIT/ML injection Inject 3-16 Units into the skin 3 (three) times daily before meals. Sliding scale       . insulin glargine (LANTUS) 100 UNIT/ML injection Inject 12 Units into the skin at bedtime.       . midodrine (PROAMATINE) 10 MG tablet Take 10 mg by mouth 3 (three) times a week. Take on Monday, Wednesday, and Friday      . pregabalin (LYRICA) 50 MG capsule Take 50 mg by mouth at bedtime.      . Propylene Glycol (SYSTANE BALANCE OP) Place 1 drop into both eyes as needed. Dry eyes      . sevelamer (RENVELA) 800 MG tablet Take 2,400 mg by mouth 3 (three) times daily with meals. 3 tabs by mouth three times a day      . oxyCODONE (OXY IR/ROXICODONE) 5 MG immediate release tablet Take 1 tablet (5 mg total) by mouth every 8 (eight) hours as needed for pain.  30 tablet  0  . oxyCODONE-acetaminophen (ROXICET) 5-325 MG per tablet Take 1-2 tablets by mouth every 4 (four) hours as needed for pain.  30 tablet  0   No current facility-administered medications for this visit.   REVIEW OF SYSTEMS: [X ] denotes positive finding; [  ] denotes negative finding CARDIOVASCULAR:  [ ] chest pain   [ ] chest pressure   [ ] palpitations   [ ] orthopnea   [X ] dyspnea on exertion   [ ] claudication   [ ] rest pain   [ ] DVT   [ ] phlebitis PULMONARY:   [ ] productive cough   [ ] asthma   [ ] wheezing NEUROLOGIC:   [ ] weakness  [ ] paresthesias  [ ] aphasia  [ ] amaurosis  [ ] dizziness HEMATOLOGIC:   [ ] bleeding problems   [ ] clotting disorders MUSCULOSKELETAL:  [ ] joint pain   [ ] joint swelling [ ] leg swelling GASTROINTESTINAL: [ ]  blood in stool  [ ]  hematemesis GENITOURINARY:  [ ]  dysuria  [ ]  hematuria PSYCHIATRIC:  [ ] history of major depression INTEGUMENTARY:  [ ] rashes  [ ] ulcers CONSTITUTIONAL:  [ ] fever   [ ] chills  PHYSICAL EXAM: Filed Vitals:   01/31/13 1545  BP: 98/66  Pulse: 92  Resp: 16  Height: 5' 9" (1.753 m)  Weight: 210 lb (95.255 kg)  SpO2: 97%   Body mass index is 31 kg/(m^2). GENERAL: The patient is a well-nourished female, in no acute distress. The vital signs are  documented above. CARDIOVASCULAR: There is   a regular rate and rhythm. He has palpable femoral pulses. I cannot palpate popliteal or pedal pulses on the left. She has a BKA on the right. PULMONARY: There is good air exchange bilaterally without wheezing or rales. ABDOMEN: Soft and non-tender with normal pitched bowel sounds.  MUSCULOSKELETAL: There are no major deformities or cyanosis. NEUROLOGIC: No focal weakness or paresthesias are detected. SKIN: There are no ulcers or rashes noted. PSYCHIATRIC: The patient has a normal affect.  DATA:  Reviewed her arteriogram the results of which are described above.  I have independently interpreted her vein mapping which shows that she does appear to have a reasonable greater saphenous vein on the left to the proximal calf. At that level it becomes somewhat small  MEDICAL ISSUES:  Atherosclerosis of native arteries of the extremities with ulceration(440.23) This patient has a large wound measuring 6 cm x 5 cm on the medial left heel. This is clearly a limb threatening situation. Situation is complicated by her diabetes, peripheral vascular disease, end-stage renal disease, and cardiac history. I think without revascularization she would require a below the knee amputation. Her best chance for limb salvage would be femoral to popliteal artery bypass with vein. Even with successful bypass there is risk of limb loss.I have reviewed the indications for lower extremity bypass. I have also reviewed the potential complications of surgery including but not limited to: wound healing problems, infection, graft thrombosis, limb loss, or other unpredictable medical problems. All the patient's questions were answered and they are agreeable to proceed. Given her cardiac history we will try to arrange for preoperative cardiac evaluation. Tentatively her surgery is scheduled for 02/20/2013.   of note, I did give her a prescription for 30 oxycodone for  pain.  Effie Janoski S [facility] [place] Beeper: [phone]   

## 2013-02-20 NOTE — Interval H&P Note (Signed)
History and Physical Interval Note:  02/20/2013 7:14 AM  [name]  has presented today for surgery, with the diagnosis of claudication  The various methods of treatment have been discussed with the patient and family. After consideration of risks, benefits and other options for treatment, the patient has consented to  Procedure(s) with comments: BYPASS GRAFT FEMORAL-POPLITEAL ARTERY (Left) - fem-below knee pop as a surgical intervention .  The patient's history has been reviewed, patient examined, no change in status, stable for surgery.  I have reviewed the patient's chart and labs.  Questions were answered to the patient's satisfaction.     Ameir Faria S

## 2013-02-20 NOTE — Progress Notes (Signed)
Pt. Admitted to room 3308; bp 100/55; call light within reach; Pt. Oriented to room; will continue to monitor.  Vivi Martens RN

## 2013-02-20 NOTE — Progress Notes (Signed)
Rt upper arm fistula with +thrill and bruit

## 2013-02-20 NOTE — Anesthesia Preprocedure Evaluation (Addendum)
Anesthesia Evaluation  Patient identified by MRN, date of birth, ID band Patient awake    Reviewed: Allergy & Precautions, H&P , NPO status , Patient's Chart, lab work & pertinent test results, reviewed documented beta blocker date and time   History of Anesthesia Complications Negative for: history of anesthetic complications  Airway Mallampati: II TM Distance: >3 FB Neck ROM: full    Dental  (+) Edentulous Upper and Dental Advisory Given   Pulmonary shortness of breath, sleep apnea and Continuous Positive Airway Pressure Ventilation ,  breath sounds clear to auscultation        Cardiovascular hypertension, On Medications + CAD, + CABG, + Peripheral Vascular Disease and +CHF + dysrhythmias Rhythm:regular   Nuclear stress test on 02/15/13 showed evidence of moderate ischemia in the basal inferolateral, basal anterolateral and mid inferolateral regions.  Moderate perfusion defect in the basal anterior, basal anteroseptal and mid anterior regions.  This is consistent with infarct/scar.  EF 50%.  Hypo to akinesis consistent with the above noted pattern of infarction.     Neuro/Psych TIA February 12, 2013 [facility] psych ROS   GI/Hepatic negative GI ROS, Neg liver ROS,   Endo/Other  negative endocrine ROSdiabetes, Well Controlled, Type 2, Insulin Dependent  Renal/GU ESRF and DialysisRenal diseaseHD x 14 years M,W,F  Last HD Monday 02/19/13  negative genitourinary   Musculoskeletal   Abdominal   Peds  Hematology  (+) anemia ,   Anesthesia Other Findings See surgeon's H&P   Reproductive/Obstetrics negative OB ROS                         Anesthesia Physical Anesthesia Plan  ASA: III  Anesthesia Plan: General   Post-op Pain Management:    Induction: Intravenous  Airway Management Planned: Oral ETT  Additional Equipment:   Intra-op Plan:   Post-operative Plan: Extubation in OR  Informed Consent: I  have reviewed the patients History and Physical, chart, labs and discussed the procedure including the risks, benefits and alternatives for the proposed anesthesia with the patient or authorized representative who has indicated his/her understanding and acceptance.   Dental Advisory Given  Plan Discussed with: CRNA and Surgeon  Anesthesia Plan Comments:         Anesthesia Quick Evaluation

## 2013-02-20 NOTE — Consult Note (Addendum)
[facility] Renal Consultation Note  Indication for Consultation:  Management of ESRD/hemodialysis; anemia, hypertension/volume and secondary hyperparathyroidism  HPI: [name] is a 57 y.o. female with ESRD on dialysis on MWF at the [facility], diabetes, ischemic cardiomyopathy with an ejection fraction of 25-30%, and peripheral vascular disease, s/p right below-knee amputation, who has been followed by [facility] for a nonhealing left heel wound and presented today for left femoral-to-below-knee popliteal artery bypass by Dr. Edilia Bo.  Arteriogram by Dr. Imogene Burn on 5/15 showed superficial femoral artery and popliteal artery occlusive disease in addition to severe tibial artery occlusive disease.  She is currently stable, but somnolent, post-surgery for revascularization.  On 6/2 she presented to the hospital with weakness and developed temporary confusion with expressive aphasia which was evaluated by Neurology and determined to be secondary to a TIA.  Dialysis Orders: Center: [facility] on MWF. EDW 91.5 kg  HD Bath 2K/2.25Ca  Time 4 hrs  Heparin 2800 U. Access AVF @ [facility]   BFR 400 DFR 800  Hectorol 2 mcg IV/HD   Epogen 0 Units IV/HD  Venofer 0   Profile 2  Past Medical History  Diagnosis Date  . Diabetes mellitus, type 2   . Kidney disease     ESRD secondary to DM, failed transplant 2006. Dialysis  . Secondary hyperparathyroidism   . OSA (obstructive sleep apnea)     NPSG 11.23.11-AHI 64.9/hr-central and obstructive apnea  . Ischemic cardiomyopathy     EF 25-30% 2011  . CAD (coronary artery disease)   . Pulmonary hypertension     Est PAsyst 60-Dr Mendel Ryder, off coumadin, CT neg for PE  . Hyperlipidemia   . Anemia   . Thyroid disease   . Pulmonary edema   . Peripheral vascular disease   . Sebaceous cyst   . 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 Velvet Bathe  . Irregular heart beat   . Shortness of breath     with exertion  . Hemodialysis patient     M,W,F  . TIA  (transient ischemic attack) June 2014  . Ulcer of heel due to diabetes     left   Past Surgical History  Procedure Laterality Date  . Coronary artery bypass graft  2011    3V  . Insertion of dialysis catheter    . 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 Nasal mucosal biopsy    . Ovarian cyst surgery    . 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 Laser retinal surgery  2011  . Kidney transplant      rejection  . Abdominal angiogram Left 01/25/13  . Colonoscopy    . Cardiac catheterization    . Eye surgery     Family History  Problem Relation Age of Onset  . Heart disease    . Edema Father     fluid overload  . Cerebral aneurysm Mother   . Stroke Brother   . Heart disease Brother 1 brother on dialysis in [place]    Social History Grew up in [place], grad [employer]  Taught K, 1st, and 2nd grade for 25 yr.  Currently lives at   home.  Not married  .  No cigarettes, no EtOH, no illegal  drugs  Allergies  Allergen Reactions  . Neurontin (Gabapentin) Anaphylaxis  . Ezetimibe-Simvastatin Other (See Comments)    REACTION: face edema; itching  . Klonopin (Clonazepam) Other (  See Comments)    hallucination  . Sertraline Hcl Other (See Comments)    unknown   Prior to Admission medications   Medication Sig Start Date End Date Taking? Authorizing Provider  acetaminophen (TYLENOL) 325 MG tablet Take 650 mg by mouth every 6 (six) hours as needed for pain.    Yes Historical Provider, MD  atorvastatin (LIPITOR) 10 MG tablet Take 10 mg by mouth daily before breakfast.    Yes Historical Provider, MD  B Complex-C (B-COMPLEX WITH VITAMIN C) tablet Take 1 tablet by mouth daily.   Yes Historical Provider, MD  cinacalcet (SENSIPAR) 60 MG tablet Take 60 mg by mouth daily before breakfast.    Yes Historical Provider, MD  collagenase (SANTYL) ointment Apply 1 application topically every other day.   Yes Historical  Provider, MD  insulin aspart (NOVOLOG) 100 UNIT/ML injection Inject 3-16 Units into the skin 3 (three) times daily before meals. Sliding scale   Yes Historical Provider, MD  insulin glargine (LANTUS) 100 UNIT/ML injection Inject 12 Units into the skin at bedtime.    Yes Historical Provider, MD  midodrine (PROAMATINE) 10 MG tablet Take 10 mg by mouth 3 (three) times a week. Take on Monday, Wednesday, and Friday   Yes Historical Provider, MD  oxyCODONE (OXY IR/ROXICODONE) 5 MG immediate release tablet Take 1 tablet (5 mg total) by mouth every 8 (eight) hours as needed for pain. 01/31/13  Yes Chuck Hint, MD  pregabalin (LYRICA) 50 MG capsule Take 50 mg by mouth at bedtime.   Yes Historical Provider, MD  Propylene Glycol (SYSTANE BALANCE OP) Place 1 drop into both eyes as needed (dry eyes). Dry eyes   Yes Historical Provider, MD  sevelamer (RENVELA) 800 MG tablet Take 2,400 mg by mouth 3 (three) times daily with meals. 3 tabs by mouth three times a day   Yes Historical Provider, MD  clopidogrel (PLAVIX) 75 MG tablet Take 1 tablet (75 mg total) by mouth daily. 02/13/13   Rhetta Mura, MD  ibuprofen (ADVIL,MOTRIN) 600 MG tablet Take 300 mg by mouth every 6 (six) hours as needed for pain.  01/20/13   Historical Provider, MD  tetrahydrozoline-zinc (VISINE-AC) 0.05-0.25 % ophthalmic solution Place 2 drops into both eyes 3 (three) times daily as needed (for redness).    Historical Provider, MD   Labs:  Results for orders placed during the hospital encounter of 02/20/13 (from the past 48 hour(s))  POCT I-STAT 4, (NA,K, GLUC, HGB,HCT)     Status: Abnormal   Collection Time    02/20/13  6:43 AM      Result Value Range   Sodium 137  135 - 145 mEq/L   Potassium 4.1  3.5 - 5.1 mEq/L   Glucose, Bld 113 (*) 70 - 99 mg/dL   HCT 16.1  09.6 - 04.5 %   Hemoglobin 15.0  12.0 - 15.0 g/dL  GLUCOSE, CAPILLARY     Status: Abnormal   Collection Time    02/20/13  1:38 PM      Result Value Range    Glucose-Capillary 108 (*) 70 - 99 mg/dL   Comment 1 Notify RN     Review of systems not obtained due to patient factors.  Physical Exam: Filed Vitals:   02/20/13 1350  BP: 85/52  Pulse: 91  Temp:   Resp: 3     General appearance: Somnolent post-surgery, able to arouse briefly, but nonverbal Head: Normocephalic, without obvious abnormality, atraumatic Neck: no adenopathy, no carotid bruit, no JVD  and supple, symmetrical, trachea midline Resp: clear to auscultation bilaterally Cardio: regular rate and rhythm, S1, S2 normal, no murmur, click, rub or gallop GI: soft, non-tender; bowel sounds normal; no masses,  no organomegaly Extremities: right BKA, left foot with dressing, no edema Neurologic: Unable to examine post-surgery Dialysis Access: AVF @ [facility] with + bruit (created at [facility] 02/02/11)  Assessment/Plan: 1. PVD - s/p left femoral-popliteal  bypass per Dr. Edilia Bo today; stable post-surgery; currently with poorly healing left heel wound, Hx right BKA 09/2007. 2. Recent TIA - evaluated @ [facility] 6/2-3 for episode of confusion & expressive aphasia, believed to be TIA per Neurology. 3. ESRD -  HD on MWF @ [facility]; K 4.1 today.  Next HD tomorrow. 4. Hypertension/volume  - BP most recently 87/47, Midodrine 10 mg pre-HD; no signs of fluid overload, post-HD wt 91.3 kg yesterday with EDW 91.5. 5. Anemia  - Hgb 15 pre-surgery, last T-sat 31% (5/14) with ferritin 1385 (4/9), no outpatient Epogen or Fe. 6. Metabolic bone disease -  Ca 8.5, P 3.1 (6/2), iPTH 264 (4/9); Hectorol 2 mcg, Sensipar 60 mg qd, Renvela 3 with meals. 7. Nutrition - Last Alb 2.7 (6/9), renal diet, vitamin. 8. DM Type 2 - on Lantus & SSI. 9. Hx CAD/ischemic CM - s/p CABG x 3, cath with EF 25-30% 2011. 10. 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 11. 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  LYLES,CHARLES 02/20/2013, 1:53 PM  I saw and examined [name], reviewed records and results.  She will have  dialysis tomorrow.  I expect hgb to drop folowing surgery today.  Will check Fe studies in HD tomorrow.  Agree with plans as outlined above.    Attending Nephrologist: Marina Gravel, MD

## 2013-02-20 NOTE — Anesthesia Procedure Notes (Signed)
Procedure Name: Intubation Date/Time: 02/20/2013 8:21 AM Performed by: Lovie Chol Pre-anesthesia Checklist: Patient identified, Emergency Drugs available, Suction available, Patient being monitored and Timeout performed Patient Re-evaluated:Patient Re-evaluated prior to inductionOxygen Delivery Method: Circle system utilized Preoxygenation: Pre-oxygenation with 100% oxygen Intubation Type: IV induction Ventilation: Mask ventilation without difficulty and Oral airway inserted - appropriate to patient size Laryngoscope Size: Miller and 2 Grade View: Grade I Tube type: Oral Tube size: 7.5 mm Number of attempts: 1 Airway Equipment and Method: Stylet and LTA kit utilized Placement Confirmation: ETT inserted through vocal cords under direct vision,  positive ETCO2,  CO2 detector and breath sounds checked- equal and bilateral Secured at: 22 cm Tube secured with: Tape Dental Injury: Teeth and Oropharynx as per pre-operative assessment

## 2013-02-20 NOTE — Preoperative (Signed)
Beta Blockers   Reason not to administer Beta Blockers:Not Applicable 

## 2013-02-20 NOTE — Transfer of Care (Signed)
Immediate Anesthesia Transfer of Care Note  Patient: [name]  Procedure(s) Performed: Procedure(s) with comments:  FEMORAL-to below knee POPLITEAL ARTERY bypasswith saphenous vein (Left) - fem-below knee pop  Patient Location: PACU  Anesthesia Type:General  Level of Consciousness: awake, oriented and patient cooperative  Airway & Oxygen Therapy: Patient Spontanous Breathing and Patient connected to nasal cannula oxygen  Post-op Assessment: Report given to PACU RN and Post -op Vital signs reviewed and stable  Post vital signs: Reviewed and stable  Complications: No apparent anesthesia complications

## 2013-02-20 NOTE — Progress Notes (Signed)
VASCULAR PROGRESS NOTE  SUBJECTIVE: Comfortable   PHYSICAL EXAM: Filed Vitals:   02/20/13 1420 02/20/13 1427 02/20/13 1430 02/20/13 1435  BP: 83/48 87/47  91/49  Pulse: 86 85 87 85  Temp:      TempSrc:      Resp: 6 11 15 13   SpO2: 98% 97% 98% 95%   Brisk Doppler flow left anterior tibial artery. Left foot warm  LABS: Lab Results  Component Value Date   WBC 6.8 02/19/2013   HGB 15.0 02/20/2013   HCT 44.0 02/20/2013   MCV 86.4 02/19/2013   PLT 131* 02/19/2013   Lab Results  Component Value Date   CREATININE 4.28* 02/19/2013   Lab Results  Component Value Date   INR 1.29 02/19/2013   CBG (last 3)   Recent Labs  02/20/13 1338  GLUCAP 108*   ASSESSMENT AND PLAN: Doing well postop. Some low blood pressure being treated with fluid carefully.  Cari Caraway Beeper: [phone] 02/20/2013

## 2013-02-20 NOTE — Op Note (Signed)
NAME: [name]   MRN: [id number] DOB: 06-08-1956    DATE OF OPERATION: 02/20/2013  PREOP DIAGNOSIS: Non healing left heel wound  POSTOP DIAGNOSIS: same  PROCEDURE:  1. Left femoral to below knee popliteal artery bypass with non-reversed saphenous vein graft 2. Intraop agram  SURGEON: [name]. Edilia Bo, MD, FACS  ASSIST: Della Goo, PA  ANESTHESIA: Gen.   EBL: 300 cc  INDICATIONS: [name] is a 57 y.o. female who presented with a nonhealing left heel wound. She had superficial femoral artery and popliteal artery occlusive disease in addition to severe tibial artery occlusive disease. It was felt that her best chance for limb salvage was attempted revascularization.  FINDINGS: she had diffuse calcific disease. Completion arteriogram showed single vessel runoff via the anterior tibial artery on the left.  TECHNIQUE: The patient was brought to the operating room and received a general anesthetic. The left lower extremity was prepped and draped in the usual sterile fashion. Longitudinal incision was made in the left groin. Through this incision the common femoral artery, superficial femoral artery, and deep femoral artery were dissected free and controlled with vessel loops. There was calcific plaque but a soft spot on the anterior wall of the common femoral artery. The saphenofemoral junction was dissected free to the same incision. Using 4 additional incisions along the medial aspect of the left leg, leaving skin bridges, the saphenous vein was harvested to the proximal calf. Here it branched and became unusable. Branches were divided between clips and 3-0 silk ties. Through the distal incision the below-knee popliteal artery was exposed. There was plaque in the below-knee pop 2 artery and I had to go to the distal below knee popliteal artery. This reason I divided the anterior tibial vein between 2-0 silk ties allowing exposure very distal popliteal artery. A tunnel was  created from this incision to the groin incision and the patient was then heparinized.  Next the saphenofemoral junction was clamped. A saphenous vein was excised from the femoral vein and the femoral vein oversewn with a 5-0 Prolene suture. The proximal valve in the saphenous vein was sharply excised. The common femoral, deep femoral, and superficial femoral arteries were controlled. A longitudinal arteriotomy was made in the common femoral artery. The vein was sewn end to side to the artery in a non-reversed fashion with continuous 5-0 Prolene suture. Next prior to completing the anastomosis the artery was backbled and flushed there was good inflow. The anastomosis was completed. The valves were cut with a retrograde [place] valvulotome. Excellent flow was established through the graft which was then marked to prevent twisting. The graft was then brought through the previously placed tunnel. This was subfascial. A tourniquet was placed on the upper thigh. The leg was exsanguinated with an Esmarch bandage and the tourniquet inflated to 300 mm of mercury. Under tourniquet control, a longitudinal arteriotomy was made in the below knee popliteal artery. The vein graft was spatulated after I connected the bifid part of the vein to allow for a widely spatulated anastomosis. The distal anastomosis was done end to side using continuous 6-0 Prolene suture. Prior to completing this anastomosis, the tourniquet was released. The artery was backbled and flushed appropriately and the anastomosis completed. There was a brisk anterior tibial signal at the completion. Completion arteriogram showed no technical problems.   Each of the thigh incisions for vein harvest was closed with 2 deep layers of 3-0 Vicryl and the skin closed with 4-0 Vicryl. The distal incision  was closed with deep layer of 2-0 Vicryl. The subcutaneous layer was closed with 3-0 Vicryl. The skin was closed with 4-0 subcuticular stitch. The groin incision was  closed with 2 deep layers of 2-0 Vicryl, the subcutaneous layer was closed with 3-0 Vicryl and the skin was closed with 4-0 subcuticular stitch. Sterile dressing was applied. Patient tolerated the procedure well and was transferred to the recovery room in stable condition. All needle and sponge counts were correct.  Waverly Ferrari, MD, FACS [facility]  DATE OF DICTATION:   02/20/2013

## 2013-02-21 LAB — BASIC METABOLIC PANEL
Calcium: 8.3 mg/dL — ABNORMAL LOW (ref 8.4–10.5)
Chloride: 95 mEq/L — ABNORMAL LOW (ref 96–112)
GFR calc Af Amer: 8 mL/min — ABNORMAL LOW (ref >90)
GFR calc non Af Amer: 7 mL/min — ABNORMAL LOW (ref >90)
Glucose, Bld: 103 mg/dL — ABNORMAL HIGH (ref 70–99)
Potassium: 4.7 mEq/L (ref 3.5–5.1)
Sodium: 135 mEq/L (ref 135–145)

## 2013-02-21 LAB — RENAL FUNCTION PANEL
Albumin: 2.7 g/dL — ABNORMAL LOW (ref 3.5–5.2)
BUN: 26 mg/dL — ABNORMAL HIGH (ref 6–23)
Chloride: 96 mEq/L (ref 96–112)
GFR calc Af Amer: 8 mL/min — ABNORMAL LOW (ref >90)
Glucose, Bld: 111 mg/dL — ABNORMAL HIGH (ref 70–99)
Potassium: 4.7 mEq/L (ref 3.5–5.1)
Sodium: 134 mEq/L — ABNORMAL LOW (ref 135–145)

## 2013-02-21 LAB — CBC
HCT: 36.7 % (ref 36.0–46.0)
Platelets: 123 10*3/uL — ABNORMAL LOW (ref 150–400)
RBC: 4.23 MIL/uL (ref 3.87–5.11)
RDW: 15.5 % (ref 11.5–15.5)
WBC: 12.1 10*3/uL — ABNORMAL HIGH (ref 4.0–10.5)

## 2013-02-21 LAB — HEMOGLOBIN A1C
Hgb A1c MFr Bld: 6 % — ABNORMAL HIGH (ref <5.7)
Mean Plasma Glucose: 126 mg/dL — ABNORMAL HIGH (ref <117)

## 2013-02-21 LAB — GLUCOSE, CAPILLARY
Glucose-Capillary: 143 mg/dL — ABNORMAL HIGH (ref 70–99)
Glucose-Capillary: 106 mg/dL — ABNORMAL HIGH (ref 70–99)

## 2013-02-21 MED ORDER — CEPHALEXIN 250 MG PO CAPS
250.0000 mg | ORAL_CAPSULE | Freq: Two times a day (BID) | ORAL | Status: DC
  Administered 2013-02-21 – 2013-02-24 (×6): 250 mg via ORAL
  Filled 2013-02-21 (×8): qty 1

## 2013-02-21 MED ORDER — DOXERCALCIFEROL 4 MCG/2ML IV SOLN
INTRAVENOUS | Status: AC
  Administered 2013-02-21: 2 ug via INTRAVENOUS
  Filled 2013-02-21: qty 2

## 2013-02-21 MED ORDER — OXYCODONE HCL 5 MG PO TABS
ORAL_TABLET | ORAL | Status: AC
  Administered 2013-02-21: 10 mg via ORAL
  Filled 2013-02-21: qty 2

## 2013-02-21 MED ORDER — SODIUM CHLORIDE 0.9 % IV BOLUS (SEPSIS)
250.0000 mL | Freq: Once | INTRAVENOUS | Status: AC
  Administered 2013-02-21: 250 mL via INTRAVENOUS

## 2013-02-21 NOTE — Progress Notes (Signed)
Patient's BP 83/58 HR 91,rechecked with manual cuff 85/50.Dr. Darrick Penna notified,order received.will continue to monitor. Ketrina Boateng Joselita,RN

## 2013-02-21 NOTE — Progress Notes (Signed)
Subjective:  Seen on dialysis, no complaints, comfortable.  Objective: Vital signs in last 24 hours: Temp:  [97.4 F (36.3 C)-98.9 F (37.2 C)] 97.9 F (36.6 C) (06/11 0648) Pulse Rate:  [74-97] 86 (06/11 0715) Resp:  [2-19] 16 (06/11 0648) BP: (72-127)/(47-68) 107/63 mmHg (06/11 0715) SpO2:  [90 %-100 %] 99 % (06/10 2348) Weight:  [91.5 kg (201 lb 11.5 oz)-97.6 kg (215 lb 2.7 oz)] 97.1 kg (214 lb 1.1 oz) (06/11 1610) Weight change:   Intake/Output from previous day: 06/10 0701 - 06/11 0700 In: 850 [I.V.:800; IV Piggyback:50] Out: 300 [Blood:300]   EXAM: General appearance:  Alert, in no apparent distress Resp:  CTA without rales, rhonchi, or wheezes Cardio:  RRR without murmur or rub GI: + BS, soft and nontender Extremities:  R BKA, L foot with dressing, no edema Access:  AVF @ RUA with BFR 400 cc/min  Lab Results:  Recent Labs  02/20/13 1734 02/21/13 0733  WBC 11.6* 12.1*  HGB 11.8* 11.8*  HCT 36.2 36.7  PLT 129* 123*   BMET:  Recent Labs  02/19/13 1536  02/21/13 0410 02/21/13 0713  NA 137  < > 135 134*  K 3.4*  < > 4.7 4.7  CL 93*  --  95* 96  CO2 31  --  26 27  GLUCOSE 88  < > 103* 111*  BUN 13  --  26* 26*  CREATININE 4.28*  < > 5.86* 6.19*  CALCIUM 8.5  --  8.3* 8.2*  ALBUMIN 2.7*  --   --  2.7*  < > = values in this interval not displayed. No results found for this basename: PTH,  in the last 72 hours Iron Studies: No results found for this basename: IRON, TIBC, TRANSFERRIN, FERRITIN,  in the last 72 hours  Dialysis Orders: Center: [facility] on MWF.  EDW 91.5 kg HD Bath 2K/2.25Ca Time 4 hrs Heparin 2800 U. Access AVF @ RUA BFR 400 DFR 800 Hectorol 2 mcg IV/HD Epogen 0 Units IV/HD Venofer 0 Profile 2   Assessment/Plan: 1. PVD - s/p left femoral-popliteal bypass per Dr. 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 2. Recent TIA - evaluated @ [facility]3 for episode of confusion & expressive aphasia, believed  to be TIA per Neurology. 3. ESRD - HD on MWF @ [facility]; K 4.7 pre-HD.  HD now. 4. Hypertension/volume - BP most recently 111/65, Midodrine 10 mg pre-HD; no signs of fluid overload, post-HD wt 97.1 kg today with EDW 91.5--will reduce goal in dialysis. 5. Anemia - Hgb 11.8 today (post-surgery), last T-sat 31% (5/14) with ferritin 1385 (4/9), no outpatient Epogen or Fe. 6. Metabolic bone disease - Ca 8.2 (9.2 corrected), P 5.5, iPTH 264 (4/9); Hectorol 2 mcg, Sensipar 60 mg qd, Renvela 3 with meals. 7. Nutrition - Alb 2.7, renal diet, vitamin. 8. DM Type 2 - on Lantus & SSI. 9. Hx CAD/ischemic CM - s/p CABG x 3, cath with EF 25-30% 2011. 10. 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 11. 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     LOS: 1 day   LYLES,CHARLES 02/21/2013,8:36 AM I saw and examined Ms. Stucki and reviewed records and results.  I agree with plans as outlined above.

## 2013-02-21 NOTE — Progress Notes (Signed)
OT Cancellation Note:  Pt currently in HD.  Will continue to follow. 02/21/2013 Martie Round, OTR/L Pager: [phone]

## 2013-02-21 NOTE — Evaluation (Signed)
Physical Therapy Evaluation Patient Details Name: [name] MRN: [id number] DOB: Apr 10, 1956 Today's Date: 02/21/2013 Time: 1257-1310 PT Time Calculation (min): 13 min  PT Assessment / Plan / Recommendation Clinical Impression  Pt is a 57 y.o. female with ESRD, diabetes, and peripheral vascular disease, s/p right below-knee amputation, who is recovering  from left femoral-to-below-knee popliteal artery bypass.  Pt presents with lethargy having had HD this morning and receiving pain meds. She requires minimal assist for mobility and ADL. Will likely progress well. Recommending SNF at this point due to level of care required and no available assist at home.    PT Assessment  Patient needs continued PT services    Follow Up Recommendations  SNF    Does the patient have the potential to tolerate intense rehabilitation      Barriers to Discharge Decreased caregiver support      Equipment Recommendations  Other (comment);None recommended by PT (TBD post acute)    Recommendations for Other Services     Frequency Min 3X/week    Precautions / Restrictions Precautions Precautions: Fall Precaution Comments: Pt has R LE prosthesis, L post op shoe Restrictions Weight Bearing Restrictions: No   Pertinent Vitals/Pain       Mobility  Bed Mobility Bed Mobility: Supine to Sit;Sitting - Scoot to Edge of Bed Supine to Sit: 6: Modified independent (Device/Increase time) Sitting - Scoot to Edge of Bed: 6: Modified independent (Device/Increase time) Transfers Transfers: Stand to Sit;Sit to Stand Sit to Stand: 4: Min assist;With upper extremity assist;From bed Stand to Sit: 4: Min assist;With upper extremity assist;To chair/3-in-1 Details for Transfer Assistance: steady assist Ambulation/Gait General Gait Details: Pt with right BK amputaion and prosthetic Stairs: No Wheelchair Mobility Wheelchair Mobility: No    Exercises     PT Diagnosis: Difficulty walking;Acute pain;Other  (comment) (decr activity tolerance)  PT Problem List: Decreased strength;Decreased activity tolerance;Decreased balance;Decreased mobility;Decreased safety awareness PT Treatment Interventions: DME instruction;Gait training;Functional mobility training;Therapeutic activities;Patient/family education   PT Goals Acute Rehab PT Goals PT Goal Formulation: With patient Time For Goal Achievement: 03/07/13 Potential to Achieve Goals: Good Pt will go Sit to Stand: with modified independence PT Goal: Sit to Stand - Progress: Goal set today Pt will go Stand to Sit: with modified independence PT Goal: Stand to Sit - Progress: Goal set today Pt will Ambulate: >150 feet;with modified independence;with least restrictive assistive device PT Goal: Ambulate - Progress: Goal set today  Visit Information  Last PT Received On: 02/21/13 Assistance Needed: +1    Subjective Data  Patient Stated Goal: return home   Prior Functioning  Home Living Lives With: Alone Available Help at Discharge: Family Type of Home: Apartment Home Access: Level entry Home Layout: One level Bathroom Shower/Tub: Engineer, manufacturing systems: Handicapped height Bathroom Accessibility: Yes How Accessible: Accessible via walker Home Adaptive Equipment: Dan Humphreys - four wheeled;Wheelchair - manual;Grab bars in shower;Straight cane Additional Comments: home is handicap accessible Prior Function Level of Independence: Independent with assistive device(s) Driving: Yes Vocation: On disability Comments: Pt states she used 4 wheeled walker to ambulate short distances and manual wheelchair for longer distances Communication Communication: No difficulties Dominant Hand: Right    Cognition  Cognition Arousal/Alertness: Lethargic Behavior During Therapy: WFL for tasks assessed/performed Overall Cognitive Status: Impaired/Different from baseline Area of Impairment: Memory Memory: Decreased short-term memory General Comments:  poor historian, likely med related    Extremity/Trunk Assessment Right Upper Extremity Assessment RUE ROM/Strength/Tone: WFL for tasks assessed Left Upper Extremity Assessment LUE ROM/Strength/Tone:  WFL for tasks assessed Right Lower Extremity Assessment RLE ROM/Strength/Tone: [facility] for tasks assessed Left Lower Extremity Assessment LLE ROM/Strength/Tone: [facility] for tasks assessed Trunk Assessment Trunk Assessment: Normal   Balance Balance Balance Assessed: Yes Dynamic Sitting Balance Dynamic Sitting - Balance Support: No upper extremity supported Dynamic Sitting - Level of Assistance: 5: Stand by assistance Dynamic Sitting - Balance Activities: Forward lean/weight shifting (to donn prosthesis) Dynamic Sitting - Comments: pt donned her neopreme sleeve and ply socks before putting on her prosthesis with no need for asssitance  End of Session PT - End of Session Activity Tolerance: Patient limited by fatigue Patient left: in chair;with call bell/phone within reach Nurse Communication: Mobility status  GP     Esmeralda Blanford, Eliseo Gum 02/21/2013, 4:15 PM 02/21/2013  [place] Bing, PT [phone]  (pager)

## 2013-02-21 NOTE — Progress Notes (Deleted)
Utilization review completed.  

## 2013-02-21 NOTE — Progress Notes (Signed)
Utilization review completed.  

## 2013-02-21 NOTE — Progress Notes (Addendum)
Pt returned from HD, VSS, PT/OT worked w/Pt, up to chair. Dressing change to L heel(santyl) Pt to be TX to 6727, called report

## 2013-02-21 NOTE — Progress Notes (Signed)
VASCULAR SURGERY PROGRESS NOTE  SUBJECTIVE: No complaints.  PHYSICAL EXAM: Filed Vitals:   02/20/13 1810 02/20/13 2000 02/20/13 2348 02/21/13 0400  BP: 95/61 106/67 93/53 98/63   Pulse: 82 82 93   Temp:  97.9 F (36.6 C) 98.3 F (36.8 C) 98.8 F (37.1 C)  TempSrc:  Oral Oral Oral  Resp: 17 16 17    Height:      Weight:    215 lb 2.7 oz (97.6 kg)  SpO2: 97% 97% 99%    Brisk ATA signal with doppler. Foot warm.  Incisions all look fine.   LABS: Lab Results  Component Value Date   WBC 11.6* 02/20/2013   HGB 11.8* 02/20/2013   HCT 36.2 02/20/2013   MCV 87.2 02/20/2013   PLT 129* 02/20/2013   CBG (last 3)   Recent Labs  02/20/13 1338 02/20/13 1826 02/20/13 2203  GLUCAP 108* 115* 143*   Principal Problem:   Atherosclerotic PVD with ulceration Active Problems:   HYPERLIPIDEMIA   OBSTRUCTIVE SLEEP APNEA   HYPERTENSION   End stage renal disease   Cardiomyopathy, ischemic   DM (diabetes mellitus), secondary, w/peripheral vascular complications  ASSESSMENT AND PLAN:  * 1 Day Post-Op s/p: Left Fem BK Pop with vein for non healing heel decubitus.   * Diabetes Management: Diabetes currently under good control.   * ID: will start her on Keflex given the dry gangrene of the left heel.   * Wound Management: Needs Rooke boot for left heel or if that is not available, then Prafo boot.  * DVT prophylaxis: Is on Lovenox   * Statin: Is on Lipitor  * ESRD: HD MWF ([facility])  * Transfer to 2000  * Disposition: Anticipate D/C after HD on Friday.   Waverly Ferrari, MD, FACS Beeper [phone] 02/21/2013

## 2013-02-21 NOTE — Procedures (Signed)
[facility]  On HD via R upper arm AVF BP--103/63 Goal--4.5 L (original goal to get to EDW of 91.5 kg would have              been 6  L) Hgb--11.8   K+--4.7

## 2013-02-21 NOTE — Progress Notes (Signed)
Pt transferred to unit 6700 from unit 3300. Pt is alert and oriented to staff, call bell, and room. Family at bedside. Bed in lowest position. Call bell within reach. Full assessment to Epic. Will continue to monitor. Fayne Norrie, RN

## 2013-02-21 NOTE — Evaluation (Signed)
Occupational Therapy Evaluation Patient Details Name: [name] MRN: [id number] DOB: 1956-02-09 Today's Date: 02/21/2013 Time: 1259-1310 OT Time Calculation (min): 11 min  OT Assessment / Plan / Recommendation Clinical Impression  Pt is a 57 y.o. female with ESRD, diabetes, and peripheral vascular disease, s/p right below-knee amputation, who is recovering  from left femoral-to-below-knee popliteal artery bypass.  Pt presents with lethargy having had HD this morning and receiving pain meds. She requires minimal assist for mobility and ADL. Will likely progress well. Recommending SNF at this point due to level of care required and no available assist at home.     OT Assessment  Patient needs continued OT Services    Follow Up Recommendations  SNF    Barriers to Discharge Decreased caregiver support    Equipment Recommendations       Recommendations for Other Services    Frequency  Min 2X/week    Precautions / Restrictions Precautions Precautions: Fall Precaution Comments: Pt has R LE prosthesis, L post op shoe Restrictions Weight Bearing Restrictions: No   Pertinent Vitals/Pain L LE, elevated, premedicated, did not rate pain    ADL  Eating/Feeding: Set up Where Assessed - Eating/Feeding: Chair Grooming: Wash/dry hands;Set up Where Assessed - Grooming: Supported sitting Upper Body Bathing: Minimal assistance Where Assessed - Upper Body Bathing: Unsupported sitting Lower Body Bathing: Moderate assistance Where Assessed - Lower Body Bathing: Unsupported sitting;Supported sit to stand Upper Body Dressing: Minimal assistance Where Assessed - Upper Body Dressing: Unsupported sitting Lower Body Dressing: Minimal assistance (prosthesis) Where Assessed - Lower Body Dressing: Unsupported sitting;Supported sit to stand Equipment Used: Gait belt;Rolling walker (LE prosthesis, post op shoe) Transfers/Ambulation Related to ADLs: Min assist with RW and prosthesis. ADL Comments:  Pt weak, had HD today and just given morphine.    OT Diagnosis: Generalized weakness;Cognitive deficits;Acute pain  OT Problem List: Decreased strength;Decreased activity tolerance;Impaired balance (sitting and/or standing);Decreased cognition;Pain OT Treatment Interventions: Self-care/ADL training;DME and/or AE instruction;Patient/family education;Balance training   OT Goals Acute Rehab OT Goals OT Goal Formulation: With patient Time For Goal Achievement: 03/07/13 Potential to Achieve Goals: Good ADL Goals Pt Will Perform Grooming: with supervision;Sitting at sink ADL Goal: Grooming - Progress: Goal set today Pt Will Perform Lower Body Bathing: with supervision;Sit to stand from bed ADL Goal: Lower Body Bathing - Progress: Goal set today Pt Will Perform Lower Body Dressing: with supervision;Sit to stand from bed ADL Goal: Lower Body Dressing - Progress: Goal set today Pt Will Transfer to Toilet: with supervision;Ambulation;with DME ADL Goal: Toilet Transfer - Progress: Goal set today Pt Will Perform Toileting - Clothing Manipulation: with supervision;Standing ADL Goal: Toileting - Clothing Manipulation - Progress: Goal set today Pt Will Perform Toileting - Hygiene: with supervision;Sit to stand from 3-in-1/toilet ADL Goal: Toileting - Hygiene - Progress: Goal set today  Visit Information  Last OT Received On: 02/21/13 Assistance Needed: +1 PT/OT Co-Evaluation/Treatment: Yes Reason Eval/Treat Not Completed: Patient at procedure or test/ unavailable (HD)    Subjective Data  Subjective: "My leg feels woozy." Patient Stated Goal: Regain independence.   Prior Functioning     Home Living Lives With: Alone Type of Home: Apartment Home Access: Level entry Home Layout: One level Bathroom Shower/Tub: Engineer, manufacturing systems: Handicapped height Bathroom Accessibility: Yes How Accessible: Accessible via walker Home Adaptive Equipment: Walker - four wheeled;Wheelchair -  manual;Grab bars in shower;Straight cane Additional Comments: home is handicap accessible Prior Function Level of Independence: Independent with assistive device(s) Driving: Yes Vocation: On disability Communication  Communication: No difficulties Dominant Hand: Right         Vision/Perception Vision - History Patient Visual Report: No change from baseline   Cognition  Cognition Arousal/Alertness: Lethargic Behavior During Therapy: WFL for tasks assessed/performed Overall Cognitive Status: Impaired/Different from baseline Area of Impairment: Memory Memory: Decreased short-term memory General Comments: poor historian, likely med related    Extremity/Trunk Assessment Right Upper Extremity Assessment RUE ROM/Strength/Tone: WFL for tasks assessed Left Upper Extremity Assessment LUE ROM/Strength/Tone: WFL for tasks assessed Trunk Assessment Trunk Assessment: Normal     Mobility Bed Mobility Bed Mobility: Supine to Sit;Sitting - Scoot to Edge of Bed Supine to Sit: 6: Modified independent (Device/Increase time) Sitting - Scoot to Edge of Bed: 6: Modified independent (Device/Increase time) Transfers Transfers: Sit to Stand;Stand to Sit Sit to Stand: 4: Min assist;With upper extremity assist;From bed Stand to Sit: 4: Min assist;With upper extremity assist;To chair/3-in-1     Exercise     Balance Balance Balance Assessed: Yes Dynamic Sitting Balance Dynamic Sitting - Balance Support: No upper extremity supported Dynamic Sitting - Level of Assistance: 6: Modified independent (Device/Increase time) Dynamic Sitting - Balance Activities: Forward lean/weight shifting (to donn prosthesis)   End of Session OT - End of Session Activity Tolerance: Patient limited by fatigue;Patient limited by pain Patient left: in chair;with call bell/phone within reach Nurse Communication: Mobility status  GO     Evern Bio 02/21/2013, 1:30 PM [phone]

## 2013-02-22 ENCOUNTER — Encounter (HOSPITAL_COMMUNITY): Payer: Self-pay | Admitting: Vascular Surgery

## 2013-02-22 LAB — CBC
HCT: 35.3 % — ABNORMAL LOW (ref 36.0–46.0)
Hemoglobin: 11.6 g/dL — ABNORMAL LOW (ref 12.0–15.0)
MCH: 28.4 pg (ref 26.0–34.0)
MCHC: 32.9 g/dL (ref 30.0–36.0)
RDW: 15.9 % — ABNORMAL HIGH (ref 11.5–15.5)

## 2013-02-22 LAB — CK TOTAL AND CKMB (NOT AT ARMC)
CK, MB: 8.7 ng/mL (ref 0.3–4.0)
Relative Index: 5.6 — ABNORMAL HIGH (ref 0.0–2.5)
Total CK: 154 U/L (ref 7–177)

## 2013-02-22 LAB — RENAL FUNCTION PANEL
Albumin: 2.4 g/dL — ABNORMAL LOW (ref 3.5–5.2)
BUN: 19 mg/dL (ref 6–23)
Calcium: 8.4 mg/dL (ref 8.4–10.5)
Creatinine, Ser: 4.85 mg/dL — ABNORMAL HIGH (ref 0.50–1.10)
Glucose, Bld: 124 mg/dL — ABNORMAL HIGH (ref 70–99)
Phosphorus: 5.1 mg/dL — ABNORMAL HIGH (ref 2.3–4.6)
Potassium: 4.2 mEq/L (ref 3.5–5.1)

## 2013-02-22 LAB — GLUCOSE, CAPILLARY
Glucose-Capillary: 124 mg/dL — ABNORMAL HIGH (ref 70–99)
Glucose-Capillary: 101 mg/dL — ABNORMAL HIGH (ref 70–99)

## 2013-02-22 MED ORDER — NEPRO/CARBSTEADY PO LIQD: 237.0000 mL | Freq: Two times a day (BID) | ORAL | Status: DC

## 2013-02-22 MED ORDER — SODIUM CHLORIDE 0.9 % IV BOLUS (SEPSIS): 300.0000 mL | Freq: Once | INTRAVENOUS | Status: DC

## 2013-02-22 NOTE — Progress Notes (Signed)
Orthopedic Tech Progress Note Patient Details:  [name] 1956-02-20 [id number] CAM Walker applied to Left LE with application instructions. Patient tolerated application well.  Ortho Devices Type of Ortho Device: CAM walker Ortho Device/Splint Location: Left LE Ortho Device/Splint Interventions: Application   Asia R Thompson 02/22/2013, 8:49 AM

## 2013-02-22 NOTE — Progress Notes (Signed)
As per nephrologist and rapid response suggestion,Dr. Edilia Bo was notified about pt.'s BP dropping and the need for boluses os saline in order to bring it up.MD. On his way to assess the pt.keep monitoring pt. Closely and assessing his needs.

## 2013-02-22 NOTE — Consult Note (Addendum)
Admit date: 02/20/2013 Referring Physician  Waverly Ferrari, MD Primary Physician  Renford Dills, MD. Primary Cardiologist  HWBSmith, III, MD Reason for Consultation:  Arrhythmia  ASSESSMENT: 1. Salvos of SVT, asymptomatic 2.Chronic combined systolic and diastolic heart failure, recent LVEF 45-50% with regional WMA. No evidence of volume overload or decompensation currently. 3.Coronary artery disease with prior CABG and no current symptoms 4. History of Pulmonary hypertension 5. End stage renal disease on chronic dialysis 6. S/P left Fem BK Pop for non healing ulcer, 02/20/13 7. Hypotension, recurrent over the years and volume related  PLAN: 1. Observation of rhythm. No specific therapy currently 2. Treat hypotension with IV fluid if possible rather than pressors to avoid peripheral vasoconstriction in recent surgical bed. 3. Cardiac markers to r/o silent MI  HPI:  Called to see patient by Dr. Edilia Bo because of reports of runs of arrhythmia. She has no cardiopulmonary complaints. Monitor reveals non sustained runs of atrial tach . No obvious atrial fib noted. She specifically denies chest pain, dyspnea, and palpitations.   PMH:   Past Medical History  Diagnosis Date  . Diabetes mellitus, type 2   . Kidney disease     ESRD secondary to DM, failed transplant 2006. Dialysis  . Secondary hyperparathyroidism   . OSA (obstructive sleep apnea)     NPSG 11.23.11-AHI 64.9/hr-central and obstructive apnea  . Ischemic cardiomyopathy     EF 25-30% 2011  . CAD (coronary artery disease)   . Pulmonary hypertension     Est PAsyst 60-Dr Mendel Ryder, off coumadin, CT neg for PE  . Hyperlipidemia   . Anemia   . Thyroid disease   . Pulmonary edema   . Peripheral vascular disease   . Sebaceous cyst   . 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 Velvet Bathe  . Irregular heart beat   . Shortness of breath     with exertion  . Hemodialysis patient     M,W,F  . TIA (transient ischemic attack)  June 2014  . Ulcer of heel due to diabetes     left     PSH:   Past Surgical History  Procedure Laterality Date  . Coronary artery bypass graft  2011    3V  . Insertion of dialysis catheter    . 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 Nasal mucosal biopsy    . Ovarian cyst surgery    . 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 Laser retinal surgery  2011  . Kidney transplant      rejection  . Abdominal angiogram Left 01/25/13  . Colonoscopy    . Cardiac catheterization    . Eye surgery      Allergies:  Neurontin; Ezetimibe-simvastatin; Klonopin; and Sertraline hcl Prior to Admit Meds:   Prescriptions prior to admission  Medication Sig Dispense Refill  . acetaminophen (TYLENOL) 325 MG tablet Take 650 mg by mouth every 6 (six) hours as needed for pain.       [name] atorvastatin (LIPITOR) 10 MG tablet Take 10 mg by mouth daily before breakfast.       . B Complex-C (B-COMPLEX WITH VITAMIN C) tablet Take 1 tablet by mouth daily.      . cinacalcet (SENSIPAR) 60 MG tablet Take 60 mg by mouth daily before breakfast.       . collagenase (SANTYL) ointment Apply 1 application topically every other day.      . insulin aspart (  NOVOLOG) 100 UNIT/ML injection Inject 3-16 Units into the skin 3 (three) times daily before meals. Sliding scale      . insulin glargine (LANTUS) 100 UNIT/ML injection Inject 12 Units into the skin at bedtime.       . midodrine (PROAMATINE) 10 MG tablet Take 10 mg by mouth 3 (three) times a week. Take on Monday, Wednesday, and Friday      . oxyCODONE (OXY IR/ROXICODONE) 5 MG immediate release tablet Take 1 tablet (5 mg total) by mouth every 8 (eight) hours as needed for pain.  30 tablet  0  . pregabalin (LYRICA) 50 MG capsule Take 50 mg by mouth at bedtime.      [name] Propylene Glycol (SYSTANE BALANCE OP) Place 1 drop into both eyes as needed (dry eyes). Dry eyes      . sevelamer (RENVELA) 800 MG tablet Take 2,400 mg by mouth 3  (three) times daily with meals. 3 tabs by mouth three times a day      . clopidogrel (PLAVIX) 75 MG tablet Take 1 tablet (75 mg total) by mouth daily.  30 tablet  0  . ibuprofen (ADVIL,MOTRIN) 600 MG tablet Take 300 mg by mouth every 6 (six) hours as needed for pain.       [name] tetrahydrozoline-zinc (VISINE-AC) 0.05-0.25 % ophthalmic solution Place 2 drops into both eyes 3 (three) times daily as needed (for redness).       Fam HX:    Family History  Problem Relation Age of Onset  . Heart disease    . Edema Father     fluid overload  . Cerebral aneurysm Mother   . Stroke Brother   . Heart disease Brother    Social HX:    History   Social History  . Marital Status: Single    Spouse Name: N/A    Number of Children: N/A  . Years of Education: N/A   Occupational History  . Not on file.   Social History Main Topics  . Smoking status: Never Smoker   . Smokeless tobacco: Never Used  . Alcohol Use: No  . Drug Use: No  . Sexually Active: Not on file   Other Topics Concern  . Not on file   Social History Narrative  . No narrative on file     Review of Systems: Noncontributory  Physical Exam: Blood pressure 96/56, pulse 98, temperature 98.7 F (37.1 C), temperature source Oral, resp. rate 18, height 5\' 9"  (1.753 m), weight 91.218 kg (201 lb 1.6 oz), SpO2 94.00%. Weight change: -0.282 kg (-9.9 oz)   Lying flat in bed.  Loud summation gallop which is a chronic finding. Irregular rhythm.  Chest is clear.  Abdomen is soft. Labs:   Lab Results  Component Value Date   WBC 15.6* 02/22/2013   HGB 11.6* 02/22/2013   HCT 35.3* 02/22/2013   MCV 86.5 02/22/2013   PLT 125* 02/22/2013    Recent Labs Lab 02/19/13 1536  02/22/13 1100  NA 137  < > 133*  K 3.4*  < > 4.2  CL 93*  < > 94*  CO2 31  < > 24  BUN 13  < > 19  CREATININE 4.28*  < > 4.85*  CALCIUM 8.5  < > 8.4  PROT 8.3  --   --   BILITOT 0.5  --   --   ALKPHOS 96  --   --   ALT 19  --   --   AST 23  --   --  GLUCOSE 88  < > 124*  < > = values in this interval not displayed. No results found for this basename: PTT   Lab Results  Component Value Date   INR 1.29 02/19/2013   INR 1.37 02/12/2013   INR 1.31 07/18/2012   Lab Results  Component Value Date   CKTOTAL 65 04/08/2010   CKMB 3.5 04/08/2010   TROPONINI <0.30 02/12/2013     Lab Results  Component Value Date   CHOL 130 02/13/2013   CHOL  Value: 157        ATP III CLASSIFICATION:  <200     mg/dL   Desirable  161-096  mg/dL   Borderline High  >=045    mg/dL   High        12/20/8117   CHOL  Value: 158        ATP III CLASSIFICATION:  <200     mg/dL   Desirable  147-829  mg/dL   Borderline High  >=562    mg/dL   High        10/13/8655   Lab Results  Component Value Date   HDL 25* 02/13/2013   Lab Results  Component Value Date   LDLCALC 77 02/13/2013   Lab Results  Component Value Date   TRIG 140 02/13/2013   TRIG 220* 03/30/2010   TRIG 210* 03/23/2010   Lab Results  Component Value Date   CHOLHDL 5.2 02/13/2013   No results found for this basename: LDLDIRECT      Radiology:  No recent data since 02/13/13   EKG:    Sinus tach, left axis, and poor R wave progression. No change from pre-op.    Lesleigh Noe 02/22/2013 3:11 PM

## 2013-02-22 NOTE — Progress Notes (Signed)
CRITICAL VALUE ALERT  Critical value received: Troponin 0.64, ckmb 8.7 and ck 154 Date of notification:  6/12  Time of notification:  1530  Critical value read back:yes  Nurse who received alert:  Mervin Hack rn  MD notified (1st page):  Dr Verdis Prime III  Time of first page:  1530  MD notified (2nd page):  Time of second page:  Responding MD: dr Verdis Prime III   Time MD responded:  [phone]

## 2013-02-22 NOTE — Progress Notes (Signed)
INITIAL NUTRITION ASSESSMENT  DOCUMENTATION CODES Per approved criteria  -Obesity unspecified   INTERVENTION: 1. Continue Nepro Shakes + RenaVite 2. RD to continue to follow nutrition care plan  NUTRITION DIAGNOSIS: Inadequate oral intake related to lethargy as evidenced by variable intake.  Goal: Intake to meet >90% of estimated nutrition needs.  Monitor:  weight trends, lab trends, I/O's, PO intake, supplement tolerance  Reason for Assessment: RN Consult for Poor PO Intake  57 y.o. female  Admitting Dx: Atherosclerotic PVD with ulceration  ASSESSMENT: PMHx of ESRD on HD MWF, DM, ischemic cardiomyopathy, PVD, s/p R AKA. Pt followed by [facility] for nonhealing L heel wound. Admitted for fem-pop.  Underwent fem-pop below knee on 6/10. Advanced to Renal diet with Nepro Shake PO BID today. Pt previously ordered for Clear Liquids, pt was consuming 50% of meals.  RN asked this RD to see pt 2/2 poor oral intake. Pt states that she's ready to try solid foods.  Noted per most renal note that pt's blood pressure has dropped and pt is lethargic this morning. Possibility for pt to transfer to SDU/ICU if blood pressure does not improve with treatment.  Pt with 9% wt loss x 6 months. This is not significant for this time frame however pt is at nutrition risk 2/2 non-healing wound, poor oral intkake,   Height: Ht Readings from Last 1 Encounters:  02/21/13 5\' 9"  (1.753 m)    Weight: Wt Readings from Last 1 Encounters:  02/21/13 201 lb 1.6 oz (91.218 kg)   EDW per renal: 91.5 kg  Ideal Body Weight: 130.5 lb (adjusted for BKA)  % Ideal Body Weight: 154%  Wt Readings from Last 20 Encounters:  02/21/13 201 lb 1.6 oz (91.218 kg)  02/21/13 201 lb 1.6 oz (91.218 kg)  02/19/13 201 lb 11.5 oz (91.5 kg)  02/12/13 202 lb 9.6 oz (91.9 kg)  02/03/13 208 lb 8.9 oz (94.6 kg)  01/31/13 210 lb (95.255 kg)  01/25/13 210 lb (95.255 kg)  01/25/13 210 lb (95.255 kg)  01/17/13 210 lb (95.255 kg)   10/04/12 210 lb (95.255 kg)  08/08/12 223 lb 4.8 oz (101.288 kg)  07/11/12 224 lb 14.4 oz (102.014 kg)  05/26/12 222 lb 12.8 oz (101.061 kg)  11/23/11 210 lb (95.255 kg)  11/01/11 218 lb 3.2 oz (98.975 kg)  09/30/11 226 lb (102.513 kg)  07/14/11 218 lb (98.884 kg)  05/27/11 221 lb 12.8 oz (100.608 kg)  11/12/10 218 lb (98.884 kg)  09/18/10 210 lb 8 oz (95.482 kg)    Usual Body Weight: 220 lb  % Usual Body Weight: 91%  BMI:  32.9 - adjusted for AKA - Obese Class I  Estimated Nutritional Needs: Kcal: 2000 - 2200 Protein: 100 - 120 g Fluid: 1.2 liters  Skin: 6/1  Diet Order: Renal 80-90; 1200 ml   EDUCATION NEEDS: -No education needs identified at this time   Intake/Output Summary (Last 24 hours) at 02/22/13 1032 Last data filed at 02/22/13 0648  Gross per 24 hour  Intake    550 ml  Output   3200 ml  Net  -2650 ml    Last BM: PTA  Labs:   Recent Labs Lab 02/19/13 1536 02/20/13 0643 02/20/13 1734 02/21/13 0410 02/21/13 0713  NA 137 137  --  135 134*  K 3.4* 4.1  --  4.7 4.7  CL 93*  --   --  95* 96  CO2 31  --   --  26 27  BUN 13  --   --  26* 26*  CREATININE 4.28*  --  5.49* 5.86* 6.19*  CALCIUM 8.5  --   --  8.3* 8.2*  PHOS  --   --   --   --  5.5*  GLUCOSE 88 113*  --  103* 111*    CBG (last 3)   Recent Labs  02/21/13 1812 02/21/13 2226 02/22/13 0737  GLUCAP 106* 134* 86    Scheduled Meds: . atorvastatin  10 mg Oral q1800  . cephALEXin  250 mg Oral Q12H  . cinacalcet  60 mg Oral Q breakfast  . clopidogrel  75 mg Oral Daily  . collagenase  1 application Topical QODAY  . docusate sodium  100 mg Oral Daily  . doxercalciferol  2 mcg Intravenous Q M,W,F-HD  . feeding supplement (NEPRO CARB STEADY)  237 mL Oral BID BM  . insulin aspart  0-9 Units Subcutaneous TID WC  . insulin glargine  12 Units Subcutaneous QHS  . midodrine  10 mg Oral 3 times weekly  . multivitamin  1 tablet Oral Daily  . pregabalin  50 mg Oral QHS  . sevelamer  carbonate  2,400 mg Oral TID WC    Continuous Infusions:   Past Medical History  Diagnosis Date  . Diabetes mellitus, type 2   . Kidney disease     ESRD secondary to DM, failed transplant 2006. Dialysis  . Secondary hyperparathyroidism   . OSA (obstructive sleep apnea)     NPSG 11.23.11-AHI 64.9/hr-central and obstructive apnea  . Ischemic cardiomyopathy     EF 25-30% 2011  . CAD (coronary artery disease)   . Pulmonary hypertension     Est PAsyst 60-Dr Mendel Ryder, off coumadin, CT neg for PE  . Hyperlipidemia   . Anemia   . Thyroid disease   . Pulmonary edema   . Peripheral vascular disease   . Sebaceous cyst   . 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 Velvet Bathe  . Irregular heart beat   . Shortness of breath     with exertion  . Hemodialysis patient     M,W,F  . TIA (transient ischemic attack) June 2014  . Ulcer of heel due to diabetes     left    Past Surgical History  Procedure Laterality Date  . Coronary artery bypass graft  2011    3V  . Insertion of dialysis catheter    . 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 Nasal mucosal biopsy    . Ovarian cyst surgery    . 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 Laser retinal surgery  2011  . Kidney transplant      rejection  . Abdominal angiogram Left 01/25/13  . Colonoscopy    . Cardiac catheterization    . Eye surgery      Jarold Motto MS, RD, LDN Pager: [phone] After-hours pager: [phone]

## 2013-02-22 NOTE — Progress Notes (Addendum)
[facility]  Subjective  - resting no new complaints today.  Objective 92/58 85 98.7 F (37.1 C) (Oral) 18 94%  Intake/Output Summary (Last 24 hours) at 02/22/13 0905 Last data filed at 02/22/13 0648  Gross per 24 hour  Intake    550 ml  Output   3200 ml  Net  -2650 ml   Left leg incisions clean and dry.  Without hematoma. Skin warm to touch. Anterior tibialis doppler signal left LE  Assessment/Planning: POD #2 Left femoral to below knee popliteal artery bypass with non-reversed saphenous vein graft Santyl to heel daily  COLLINS, EMMA MAUREEN 02/22/2013 9:05 AM  CBG (last 3)   Recent Labs  02/21/13 2226 02/22/13 0737 02/22/13 1150  GLUCAP 134* 86 124*   ASSESSMENT AND PLAN:   *  2 Day Post-Op s/p: Left Fem BK Pop with vein for non healing heel decubitus.   * Some Hypotension but says that her pressure normally runs low. She denies CP or SOB. Just feels tired. The nurse reports some episodes of brief tachycardia He preop Nuclear stress test showed evidence of moderate ischemia with EF 50%. Will check EKG and cardiac enzymes. Will also ask Dr. Katrinka Blazing to check on her.   * Diabetes Management: Diabetes currently under good control.   * ID: On Keflex for dry gangrene of the left heel.   * Wound Management: Now has Prafo boot.   * DVT prophylaxis: Is on Lovenox   * Statin: Is on Lipitor   * ESRD: HD MWF ([facility])   * Appreciate PTx's help.   * Disposition: Home with HHPT once BP better and "passes" PTx.  Waverly Ferrari, MD, FACS  Beeper [phone]  02/21/2013

## 2013-02-22 NOTE — Progress Notes (Signed)
[facility] Progress Note  Subjective:   Denies pain; had CL breakfast  Objective Filed Vitals:   02/21/13 2239 02/21/13 2248 02/22/13 0035 02/22/13 0439  BP: 83/58 85/50 94/58  92/58  Pulse: 91   85  Temp:    98.7 F (37.1 C)  TempSrc:    Oral  Resp: 17   18  Height:      Weight:      SpO2:    94%   Physical Exam General: NAD talking on the phone Heart: RRR Lungs: no wheezes or rales Abdomen: soft active BS Extremities: right BKA no edema; left upper leg wound sites no erythema; left LE in boot to below knee Dialysis Access: right upper AVF + bruit  Dialysis Orders: Center: [facility] on MWF.  EDW 91.5 kg HD Bath 2K/2.25Ca Time 4 hrs Heparin 2800 U. Access AVF @ RUA BFR 400 DFR 800 Hectorol 2 mcg IV/HD Epogen 0 Units IV/HD Venofer 0 Profile 2  Assessment/Plan: 1. PVD - s/p left femoral-popliteal bypass per Dr. Edilia Bo POD #1 ; bypass ok; currently with poorly healing left heel wound, is this why she is on keflex- if so would ^ to 500 bid 2. ESRD -MWF - HD in am; -  3. Anemia - post op  Hgb- was 11.8 - no ESA 4. Secondary hyperparathyroidism - continue hectorol and binders 5. HTN/volume - BP low; tells me EDW includes weight of boot on left LE; Net UF 3.2 Wednesday. An immediate post HD weight not done yesterday. 6. Nutrition - I think diet can be advanced to solids - will change to high protein renal + supplments 7. DM/hyperlipidemia - BS controlled; on statin  Sheffield Slider, PA-C [facility] Beeper [phone] 02/22/2013,9:28 AM  LOS: 2 days    Additional Objective Labs: Basic Metabolic Panel:  Recent Labs Lab 02/19/13 1536 02/20/13 0643 02/20/13 1734 02/21/13 0410 02/21/13 0713  NA 137 137  --  135 134*  K 3.4* 4.1  --  4.7 4.7  CL 93*  --   --  95* 96  CO2 31  --   --  26 27  GLUCOSE 88 113*  --  103* 111*  BUN 13  --   --  26* 26*  CREATININE 4.28*  --  5.49* 5.86* 6.19*  CALCIUM 8.5  --   --  8.3* 8.2*  PHOS  --   --   --    --  5.5*   Liver Function Tests:  Recent Labs Lab 02/19/13 1536 02/21/13 0713  AST 23  --   ALT 19  --   ALKPHOS 96  --   BILITOT 0.5  --   PROT 8.3  --   ALBUMIN 2.7* 2.7*   CBC:  Recent Labs Lab 02/19/13 1536 02/20/13 0643 02/20/13 1734 02/21/13 0733  WBC 6.8  --  11.6* 12.1*  HGB 13.7 15.0 11.8* 11.8*  HCT 42.5 44.0 36.2 36.7  MCV 86.4  --  87.2 86.8  PLT 131*  --  129* 123*  CBG:  Recent Labs Lab 02/20/13 2203 02/21/13 1214 02/21/13 1812 02/21/13 2226 02/22/13 0737  GLUCAP 143* 90 106* 134* 86  Studies/Results: Dg Ang/ext/uni/or Left  02/20/2013   *RADIOLOGY REPORT*  Clinical data:  Peripheral vascular disease  INTRAOPERATIVE ARTERIOGRAM  Findings:  This single portable intraoperative image documents opacification of a vein graft to the distal popliteal artery. Distal anastomosis widely patent.  Contiguous anterior tibial and peroneal runoff is seen to below the calf,  lower margin of the film.  The posterior tibial artery occludes in the lower calf.  IMPRESSION:  Patent graft to the infrageniculate popliteal artery, with two-vessel tibial runoff.   Original Report Authenticated By: D. Andria Rhein, MD   Medications:   . atorvastatin  10 mg Oral q1800  . cephALEXin  250 mg Oral Q12H  . cinacalcet  60 mg Oral Q breakfast  . clopidogrel  75 mg Oral Daily  . collagenase  1 application Topical QODAY  . docusate sodium  100 mg Oral Daily  . doxercalciferol  2 mcg Intravenous Q M,W,F-HD  . enoxaparin (LOVENOX) injection  30 mg Subcutaneous Q24H  . insulin aspart  0-9 Units Subcutaneous TID WC  . insulin glargine  12 Units Subcutaneous QHS  . midodrine  10 mg Oral 3 times weekly  . multivitamin  1 tablet Oral Daily  . pregabalin  50 mg Oral QHS  . sevelamer carbonate  2,400 mg Oral TID WC

## 2013-02-22 NOTE — Progress Notes (Signed)
I have seen and examined this patient and agree with plan as outlined by Audree Bane PA-C with the addition that Ms. Misiaszek' BP dropped to 78/51 and is lethargic this am.  arousable and oriented but weak.  Will give bolus of saline and check stat CBC as she may require blood transfusion following surgery.  If BP does not respond, she will likely need to be transferred to SDU/ICU. Kebrina Friend A,MD 02/22/2013 10:14 AM

## 2013-02-23 ENCOUNTER — Telehealth: Payer: Self-pay | Admitting: Vascular Surgery

## 2013-02-23 LAB — RENAL FUNCTION PANEL
CO2: 27 mEq/L (ref 19–32)
Calcium: 8.5 mg/dL (ref 8.4–10.5)
Creatinine, Ser: 5.88 mg/dL — ABNORMAL HIGH (ref 0.50–1.10)
GFR calc Af Amer: 8 mL/min — ABNORMAL LOW (ref >90)
GFR calc non Af Amer: 7 mL/min — ABNORMAL LOW (ref >90)
Phosphorus: 4.9 mg/dL — ABNORMAL HIGH (ref 2.3–4.6)
Sodium: 133 mEq/L — ABNORMAL LOW (ref 135–145)

## 2013-02-23 LAB — GLUCOSE, CAPILLARY: Glucose-Capillary: 82 mg/dL (ref 70–99)

## 2013-02-23 LAB — CBC
MCV: 85.8 fL (ref 78.0–100.0)
Platelets: 147 10*3/uL — ABNORMAL LOW (ref 150–400)
RBC: 4.09 MIL/uL (ref 3.87–5.11)
RDW: 15.7 % — ABNORMAL HIGH (ref 11.5–15.5)
WBC: 16.9 10*3/uL — ABNORMAL HIGH (ref 4.0–10.5)

## 2013-02-23 LAB — TROPONIN I: Troponin I: 0.61 ng/mL (ref <0.30)

## 2013-02-23 MED ORDER — BOOST / RESOURCE BREEZE PO LIQD
1.0000 | Freq: Two times a day (BID) | ORAL | Status: DC
  Administered 2013-02-23 – 2013-02-24 (×3): 1 via ORAL

## 2013-02-23 MED ORDER — SODIUM CHLORIDE 0.9 % IV SOLN: 100.0000 mL | INTRAVENOUS | Status: DC | PRN

## 2013-02-23 MED ORDER — LIDOCAINE HCL (PF) 1 % IJ SOLN: 5.0000 mL | INTRAMUSCULAR | Status: DC | PRN

## 2013-02-23 MED ORDER — MORPHINE SULFATE 4 MG/ML IJ SOLN
INTRAMUSCULAR | Status: AC
  Administered 2013-02-23: 4 mg
  Filled 2013-02-23: qty 1

## 2013-02-23 MED ORDER — NEPRO/CARBSTEADY PO LIQD
237.0000 mL | ORAL | Status: DC | PRN
  Filled 2013-02-23: qty 237

## 2013-02-23 MED ORDER — ALTEPLASE 2 MG IJ SOLR
2.0000 mg | Freq: Once | INTRAMUSCULAR | Status: DC | PRN
  Filled 2013-02-23: qty 2

## 2013-02-23 MED ORDER — LIDOCAINE-PRILOCAINE 2.5-2.5 % EX CREA
1.0000 "application " | TOPICAL_CREAM | CUTANEOUS | Status: DC | PRN
  Filled 2013-02-23: qty 5

## 2013-02-23 MED ORDER — DOXERCALCIFEROL 4 MCG/2ML IV SOLN
INTRAVENOUS | Status: AC
  Filled 2013-02-23: qty 2

## 2013-02-23 MED ORDER — HEPARIN SODIUM (PORCINE) 1000 UNIT/ML DIALYSIS: 1000.0000 [IU] | INTRAMUSCULAR | Status: DC | PRN

## 2013-02-23 MED ORDER — PENTAFLUOROPROP-TETRAFLUOROETH EX AERO: 1.0000 "application " | INHALATION_SPRAY | CUTANEOUS | Status: DC | PRN

## 2013-02-23 NOTE — Progress Notes (Signed)
CBG: 58 @ 08:01  Treatment: 15 GM carbohydrate snack  Symptoms: Pale, Fatigued Follow-up CBG: Time:08:21 CBG Result:56  Treatment: Patient encouraged to eat at least 50% of breakfast Follow-up CBG: Time:09:47 CBG Result:82 Possible Reasons for Event: Inadequate meal intake  Comments/MD notified:Dr. Ashley Royalty paged/notified at 08:15.    Audie Pinto

## 2013-02-23 NOTE — Clinical Social Work Placement (Addendum)
Clinical Social Work Department CLINICAL SOCIAL WORK PLACEMENT NOTE 02/23/2013  Patient:  [name], [name]  Account Number:  [id number] Admit date:  02/20/2013  Clinical Social Worker:  Genelle Bal, LCSW  Date/time:  02/23/2013 05:52 AM  Clinical Social Work is seeking post-discharge placement for this patient at the following level of care:   SKILLED NURSING   (*CSW will update this form in Epic as items are completed)   02/23/2013  Patient/family provided with [facility] Department of Clinical Social Work's list of facilities offering this level of care within the geographic area requested by the patient (or if unable, by the patient's family).  02/23/2013  Patient/family informed of their freedom to choose among providers that offer the needed level of care, that participate in Medicare, Medicaid or managed care program needed by the patient, have an available bed and are willing to accept the patient.    Patient/family informed of [facility]' ownership interest in [facility], as well as of the fact that they are under no obligation to receive care at this facility.  PASARR submitted to [employer] on 02/23/2013 PASARR number received from [employer] on   FL2 transmitted to all facilities in geographic area requested by pt/family on  02/23/2013 FL2 transmitted to all facilities within larger geographic area on   Patient informed that his/her managed care company has contracts with or will negotiate with  certain facilities, including the following:     Patient/family informed of bed offers received:   Patient chooses to discharge home on 02/24/13  Physician recommends and patient chooses bed at    Patient to discharge home on 02/24/13   Patient to be transferred to facility by   The following physician request were entered in Epic:  Additional Comments: 02/24/13 - Patient discharged home today, transported by family

## 2013-02-23 NOTE — Telephone Encounter (Signed)
Message copied by Jena Gauss on Fri Feb 23, 2013  2:31 PM ------      Message from: Sharee Pimple      Created: Fri Feb 23, 2013  2:12 PM      Regarding: schedule                   ----- Message -----         From: Dara Lords, PA-C         Sent: 02/23/2013   1:57 PM           To: Sharee Pimple, CMA            S/p:1. Left femoral to below knee popliteal artery bypass with non-reversed saphenous vein graft      2. Intraop agram on 02/20/13.            F/u with Dr. Edilia Bo in 2 weeks.            Thanks,       Lelon Mast ------

## 2013-02-23 NOTE — Progress Notes (Signed)
OT Cancellation Note  Patient Details Name: KIMBERLIN SCHEEL MRN: [id number] DOB: 19-Nov-1955   Cancelled Treatment:    Reason Eval/Treat Not Completed: Patient at procedure or test/ unavailable--hemodialysis.  Evette Georges [phone] 02/23/2013, 3:28 PM

## 2013-02-23 NOTE — Progress Notes (Signed)
Patient Name: [name] Date of Encounter: 02/23/2013    SUBJECTIVE: No cardiac complaints  TELEMETRY:  Normal sinus rhythm on monitor with rare salvos of atrial tachycardia less than 8 beats. Filed Vitals:   02/22/13 1623 02/22/13 1820 02/22/13 2200 02/23/13 0553  BP: 84/60 91/61 94/52  92/53  Pulse: 96 98 93 85  Temp: 99.7 F (37.6 C) 99.7 F (37.6 C) 99.9 F (37.7 C) 98.9 F (37.2 C)  TempSrc: Oral Oral Oral Oral  Resp:  18 16 18   Height:      Weight:   92.3 kg (203 lb 7.8 oz)   SpO2: 94% 96% 98% 99%   No intake or output data in the 24 hours ending 02/23/13 0829  LABS: Basic Metabolic Panel:  Recent Labs  16/10/96 1100 02/23/13 0216  NA 133* 133*  K 4.2 4.4  CL 94* 95*  CO2 24 27  GLUCOSE 124* 76  BUN 19 25*  CREATININE 4.85* 5.88*  CALCIUM 8.4 8.5  PHOS 5.1* 4.9*   CBC:  Recent Labs  02/22/13 1015 02/23/13 0216  WBC 15.6* 16.9*  HGB 11.6* 11.4*  HCT 35.3* 35.1*  MCV 86.5 85.8  PLT 125* 147*   Cardiac Enzymes:  Recent Labs  02/22/13 1433 02/22/13 2022 02/23/13 0216  CKTOTAL 154  --   --   CKMB 8.7*  --   --   TROPONINI 0.64* 0.67* 0.61*   BNP    Component Value Date/Time   PROBNP 439.0* 04/07/2010 1111     Radiology/Studies:  NAD  Physical Exam: Blood pressure 92/53, pulse 85, temperature 98.9 F (37.2 C), temperature source Oral, resp. rate 18, height 5\' 9"  (1.753 m), weight 92.3 kg (203 lb 7.8 oz), SpO2 99.00%. Weight change: 1.082 kg (2 lb 6.2 oz)    gallop rhythm. No murmur.  Clear lungs anteriorly   ASSESSMENT:   1. Elevated troponin levels, likely representing type II myocardial injury. Acute coronary syndrome is not felt to be present  2. Occasional salvos of atrial tachycardia, asymptomatic and less than 10 beats on each occurrence.  3. Coronary atherosclerotic heart disease with LVEF 45-50%, and recent nuclear study demonstrating small regions of ischemia  4. End-stage renal disease   Plan:   1. No specific  cardiac evaluation at this time  2. Please call if we can be of further assistance   Signed, Lesleigh Noe 02/23/2013, 8:29 AM

## 2013-02-23 NOTE — Progress Notes (Signed)
Subjective:  No complaints, comfortable  Objective: Vital signs in last 24 hours: Temp:  [98.7 F (37.1 C)-99.9 F (37.7 C)] 98.9 F (37.2 C) (06/13 0553) Pulse Rate:  [85-98] 85 (06/13 0553) Resp:  [16-18] 18 (06/13 0553) BP: (78-96)/(51-61) 92/53 mmHg (06/13 0553) SpO2:  [94 %-99 %] 99 % (06/13 0553) Weight:  [92.3 kg (203 lb 7.8 oz)] 92.3 kg (203 lb 7.8 oz) (06/12 2200) Weight change: 1.082 kg (2 lb 6.2 oz)     EXAM: General appearance:  Alert, in no apparent distress Resp:  CTA without rales, rhonchi, or wheezes Cardio:  RRR without murmur or rub GI: + BS, soft and nontender Extremities:  Right BKA, left leg in protective boot below knee Access:  AVF @ [facility] with + bruit  Lab Results:  Recent Labs  02/22/13 1015 02/23/13 0216  WBC 15.6* 16.9*  HGB 11.6* 11.4*  HCT 35.3* 35.1*  PLT 125* 147*   BMET:  Recent Labs  02/22/13 1100 02/23/13 0216  NA 133* 133*  K 4.2 4.4  CL 94* 95*  CO2 24 27  GLUCOSE 124* 76  BUN 19 25*  CREATININE 4.85* 5.88*  CALCIUM 8.4 8.5  ALBUMIN 2.4* 2.5*   No results found for this basename: PTH,  in the last 72 hours Iron Studies: No results found for this basename: IRON, TIBC, TRANSFERRIN, FERRITIN,  in the last 72 hours  Dialysis Orders: Center: [facility] on MWF.  EDW 91.5 kg HD Bath 2K/2.25Ca Time 4 hrs Heparin 2800 U. Access AVF @ R[facility] BFR 400 DFR 800 Hectorol 2 mcg IV/HD Epogen 0 Units IV/HD Venofer 0 Profile 2   Assessment/Plan: 1. PVD - s/p left femoral-popliteal bypass per Dr. 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  2. Recent TIA - evaluated @ [facility] 6/2-3 for episode of confusion & expressive aphasia, believed to be TIA per Neurology.  3. ESRD - HD on MWF @ [facility]; K 4.4 pre-HD. HD pending.  4. Hypertension/volume - BP most recently 92/53, Midodrine 10 mg pre-HD; no signs of fluid overload, wt 92.3 kg (including boot) yesterday with EDW 91.5. 5. Anemia - Hgb 11.4 today, last  T-sat 31% (5/14) with ferritin 1385 (4/9), no outpatient Epogen or Fe.  6. Metabolic bone disease - Ca 8.5 (9.7 corrected), P 4.9, iPTH 264 (4/9); Hectorol 2 mcg, Sensipar 60 mg qd, Renvela 3 with meals.  7. Nutrition - Alb 2.5, renal diet, vitamin.  8. DM Type 2 - on Lantus & SSI.  9. Hx CAD/ischemic CM - s/p CABG x 3, cath with EF 25-30% 2011.  10. 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  11. 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   LOS: 3 days   [name],[name] 02/23/2013,7:57 AM  I have seen and examined this patient and agree with plan as outlined by Gerome Apley, PA-C.  Episode of hypotension and AMS has resolved.  Unclear etiology.  Hgb down from admission but stable.  She responded to IV NS bolus.  Will decrease goal UF today with HD and cont to follow.  May be prolonged response to anesthesia post fem pop bypass. Carrieanne Kleen A,MD 02/23/2013 9:12 AM

## 2013-02-23 NOTE — Clinical Social Work Psychosocial (Signed)
Clinical Social Work Department BRIEF PSYCHOSOCIAL ASSESSMENT 02/23/2013  Patient:  [name], [name]     Account Number:  [id number]     Admit date:  02/20/2013  Clinical Social Worker:  Delmer Islam  Date/Time:  02/23/2013 05:46 AM  Referred by:  Physician  Date Referred:  02/23/2013 Referred for  SNF Placement   Other Referral:   Interview type:  Patient Other interview type:    PSYCHOSOCIAL DATA Living Status:  ALONE Admitted from facility:   Level of care:   Primary support name:  Norvel Richards Primary support relationship to patient:  FAMILY Degree of support available:   Patient has a niece and nephew: Chiarra and Norva Riffle    CURRENT CONCERNS Current Concerns  Post-Acute Placement   Other Concerns:    SOCIAL WORK ASSESSMENT / PLAN CSW introduced self and stated purpose of visit to discuss discharge plans and recommendation of ST rehab before returning home. CSW explained rehab and patient was agreeable. Patient reported that she lives alone and has a niece and nephew who look in on her.    CSW explained SNF process and gave SNF list for [facility].   Assessment/plan status:   Other assessment/ plan:   Information/referral to community resources:   SNF list for [facility]    PATIENT'S/FAMILY'S RESPONSE TO PLAN OF CARE: Patient was not very talkative but was pleasant and agreeable to ST rehab and appreciative of CSW's visit.

## 2013-02-23 NOTE — Progress Notes (Addendum)
[facility] [place]  Progress Note Bypass Surgery  Date of Surgery: 02/20/2013  Procedure(s): Left  FEMORAL-to below knee POPLITEAL ARTERY bypasswith saphenous vein with intraoperative angiogram  Surgeon: Surgeon(s): Chuck Hint, MD  3 Days Post-Op  History of Present Illness  [name] is a 57 y.o. female who is much more alert today, No C/O  Significant Diagnostic Studies: CBC Lab Results  Component Value Date   WBC 16.9* 02/23/2013   HGB 11.4* 02/23/2013   HCT 35.1* 02/23/2013   MCV 85.8 02/23/2013   PLT 147* 02/23/2013    Physical Examination  BP Readings from Last 3 Encounters:  02/23/13 92/53  02/23/13 92/53  02/19/13 104/67   Temp Readings from Last 3 Encounters:  02/23/13 98.9 F (37.2 C) Oral  02/23/13 98.9 F (37.2 C) Oral  02/19/13 97.7 F (36.5 C)    SpO2 Readings from Last 3 Encounters:  02/23/13 99%  02/23/13 99%  02/19/13 95%   Pulse Readings from Last 3 Encounters:  02/23/13 85  02/23/13 85  02/19/13 93    Pt is A&O x 3 left lower extremity: Incision/s is/are clean,dry.intact, and  healing without hematoma, erythema or drainage Limb is warm; with good color Heel wound dry  Assessment/Plan: Pt. Doing well Post-op pain is controlled Wounds are healing well PT/OT for ambulation Continue wound care as ordered DC cam walker- need heel suspension boot with lambswool Appreciate card help with hypotension and bump in Troponin - not felt to be acute coronary syndrome  [name],[name]  02/23/2013 8:59 AM        Appreciate Dr. Michaelle Copas assessment No indication for further cardiology workup Left femoropopliteal patent wounds okay Patient needs to continue mobilization For hemodialysis later today We'll work on mobilization today and plan DC home in a.m.

## 2013-02-23 NOTE — Progress Notes (Signed)
NUTRITION FOLLOW UP  Intervention:   1. Discontinue Nepro Shakes - pt does not like these. 2. Add Resource Breeze po BID, each supplement provides 250 kcal and 9 grams of protein. 3. Recommend diet liberalization to promote increased food choices. 4. RD to continue to follow nutrition care plan.  Nutrition Dx:   Inadequate oral intake related to lethargy as evidenced by variable intake.  Goal:   Intake to meet >90% of estimated nutrition needs.  Monitor:   weight trends, lab trends, I/O's, PO intake, supplement tolerance  Assessment:   PMHx of ESRD on HD MWF, DM, ischemic cardiomyopathy, PVD, s/p R AKA. Pt followed by [facility] for nonhealing L heel wound. Admitted for fem-pop.   Underwent fem-pop below knee on 6/10. Advanced to Renal diet with Nepro Shake PO BID 6/12. Pt with very poor oral intake, per RN pt is not eating or taking any supplements that are offered.  RN asked this RD to see pt 2/2 poor oral intake. Pt refusing Nepro. This RD brought pt a Resource Breeze to try, she stated that she could drink 2 daily.  Noted per most renal note that pt's blood pressure has dropped and pt is lethargic this morning. Possibility for pt to transfer to SDU/ICU if blood pressure does not improve with treatment.   Pt with 9% wt loss x 6 months. This is not significant for this time frame however pt is at nutrition risk 2/2 non-healing wound, poor oral intake and medical issues.  Height: Ht Readings from Last 1 Encounters:  02/21/13 5\' 9"  (1.753 m)    Weight Status:   Wt Readings from Last 1 Encounters:  02/22/13 203 lb 7.8 oz (92.3 kg)    Re-estimated needs:  Kcal: 2000 - 2200 Protein: 100 - 120 g Fluid: 1.2 liters  Skin: leg and groin incisions  Diet Order: Renal 80-90; 1200 ml fluid restriction  No intake or output data in the 24 hours ending 02/23/13 1124  Last BM: PTA   Labs:   Recent Labs Lab 02/21/13 0713 02/22/13 1100 02/23/13 0216  NA 134* 133* 133*  K 4.7 4.2  4.4  CL 96 94* 95*  CO2 27 24 27   BUN 26* 19 25*  CREATININE 6.19* 4.85* 5.88*  CALCIUM 8.2* 8.4 8.5  PHOS 5.5* 5.1* 4.9*  GLUCOSE 111* 124* 76    CBG (last 3)   Recent Labs  02/23/13 0801 02/23/13 0821 02/23/13 0947  GLUCAP 58* 56* 82    Scheduled Meds: . atorvastatin  10 mg Oral q1800  . cephALEXin  250 mg Oral Q12H  . cinacalcet  60 mg Oral Q breakfast  . clopidogrel  75 mg Oral Daily  . collagenase  1 application Topical QODAY  . docusate sodium  100 mg Oral Daily  . doxercalciferol  2 mcg Intravenous Q M,W,F-HD  . feeding supplement (NEPRO CARB STEADY)  237 mL Oral BID BM  . insulin aspart  0-9 Units Subcutaneous TID WC  . insulin glargine  12 Units Subcutaneous QHS  . midodrine  10 mg Oral 3 times weekly  . multivitamin  1 tablet Oral Daily  . pregabalin  50 mg Oral QHS  . sevelamer carbonate  2,400 mg Oral TID WC  . sodium chloride  300 mL Intravenous Once    Continuous Infusions:   Jarold Motto MS, RD, LDN Pager: [phone] After-hours pager: [phone]

## 2013-02-24 LAB — TYPE AND SCREEN
Antibody Screen: NEGATIVE
Donor AG Type: NEGATIVE
Unit division: 0

## 2013-02-24 LAB — GLUCOSE, CAPILLARY: Glucose-Capillary: 139 mg/dL — ABNORMAL HIGH (ref 70–99)

## 2013-02-24 MED ORDER — ENOXAPARIN SODIUM 30 MG/0.3ML ~~LOC~~ SOLN
30.0000 mg | SUBCUTANEOUS | Status: DC
  Filled 2013-02-24: qty 0.3

## 2013-02-24 MED ORDER — CEPHALEXIN 250 MG PO CAPS: 250.0000 mg | ORAL_CAPSULE | Freq: Two times a day (BID) | ORAL | Status: DC

## 2013-02-24 MED ORDER — OXYCODONE HCL 5 MG PO TABS: 5.0000 mg | ORAL_TABLET | Freq: Three times a day (TID) | ORAL | Status: DC | PRN

## 2013-02-24 NOTE — Progress Notes (Signed)
Patient discharge Home per Md order with home health PT/RN.  Discharge instructions reviewed with patient and family.  Copies of all forms given and explained. Patient/family voiced understanding of all instructions.  Discharge in no acute distress. Barnett Applebaum, RN [facility], BSN, MSN

## 2013-02-24 NOTE — Progress Notes (Signed)
Physical Therapy Treatment Patient Details Name: [name] MRN: [id number] DOB: September 13, 1956 Today's Date: 02/24/2013 Time: 4098-1191 PT Time Calculation (min): 26 min  PT Assessment / Plan / Recommendation Comments on Treatment Session  Pt able to ambulate this session however limited due to fatigue and c/o left LE pain.  Pt slow to process and sequence task at times and needs cues for proper technique.  Updated d/c plans to CIR.  Pt may benefit from further therapy prior to d/c home to mod (I).    Follow Up Recommendations  CIR     Equipment Recommendations  Other (comment);None recommended by PT (TBD post acute)    Frequency Min 3X/week   Plan Discharge plan remains appropriate;Frequency remains appropriate    Precautions / Restrictions Precautions Precautions: Fall Precaution Comments: Pt has R LE prosthesis, Left walking PRAFO shoe   Pertinent Vitals/Pain 8/10 left LE pain but able to ambulate without grimace    Mobility  Bed Mobility Bed Mobility: Supine to Sit Supine to Sit: 4: Min guard Transfers Transfers: Stand to Sit;Sit to Stand Sit to Stand: 4: Min assist;From bed Stand to Sit: 4: Min assist;To chair/3-in-1 Details for Transfer Assistance: (A) to initiate transfer and slowly descend to recliner with cues for hand placement Ambulation/Gait Ambulation/Gait Assistance: 4: Min guard Ambulation Distance (Feet): 10 Feet Assistive device: Rolling walker Ambulation/Gait Assistance Details: Minguard for safety with cues for RW placement and body position within RW.  Pt continues to hit left PRAFO on RW due left foot ER. Gait Pattern: Step-through pattern;Within Functional Limits;Decreased stance time - right Gait velocity: decrease General Gait Details: Pt ambulates with right prosthesis and now left walking PRAFO.  Pt continues to ER left foot hitting RW with ambulation Stairs: No Wheelchair Mobility Wheelchair Mobility: No    Exercises     PT Diagnosis:     PT Problem List:   PT Treatment Interventions:     PT Goals Acute Rehab PT Goals PT Goal Formulation: With patient Time For Goal Achievement: 03/07/13 Potential to Achieve Goals: Good Pt will go Sit to Stand: with modified independence PT Goal: Sit to Stand - Progress: Progressing toward goal Pt will go Stand to Sit: with modified independence PT Goal: Stand to Sit - Progress: Progressing toward goal Pt will Ambulate: >150 feet;with modified independence;with least restrictive assistive device PT Goal: Ambulate - Progress: Progressing toward goal  Visit Information  Last PT Received On: 02/24/13 Assistance Needed: +1    Subjective Data  Subjective: "I don't want to walk anymore today." Patient Stated Goal: To go home   Cognition  Cognition Arousal/Alertness: Awake/alert Behavior During Therapy: Flat affect Overall Cognitive Status: Impaired/Different from baseline Area of Impairment: Problem solving Problem Solving: Difficulty sequencing    Balance  Dynamic Sitting Balance Dynamic Sitting - Balance Support: No upper extremity supported Dynamic Sitting - Level of Assistance: 5: Stand by assistance Dynamic Sitting Balance - Compensations: ~5 minutes to donn prosthesis on EOB independently  End of Session PT - End of Session Equipment Utilized During Treatment: Gait belt;Right lower extremity prosthesis Activity Tolerance: Patient limited by fatigue Patient left: in chair;with call bell/phone within reach Nurse Communication: Mobility status   GP     [name] 02/24/2013, 9:38 AM Jake Shark, PT DPT [phone]

## 2013-02-24 NOTE — Progress Notes (Signed)
Subjective:   No complaints, comfortable  Objective: Vital signs in last 24 hours: Temp:  [97.6 F (36.4 C)-100.5 F (38.1 C)] 99.3 F (37.4 C) (06/14 0517) Pulse Rate:  [82-102] 98 (06/14 0517) Resp:  [16-19] 18 (06/14 0517) BP: (86-107)/(45-63) 91/53 mmHg (06/14 0517) SpO2:  [93 %-97 %] 93 % (06/14 0517) Weight:  [93.6 kg (206 lb 5.6 oz)-94.1 kg (207 lb 7.3 oz)] 94.1 kg (207 lb 7.3 oz) (06/13 2200) Weight change: 1.3 kg (2 lb 13.9 oz)  Intake/Output from previous day: 06/13 0701 - 06/14 0700 In: 905 [P.O.:840] Out: 800    EXAM: General appearance:  Alert, in no apparent distress Resp:  CTA without rales, rhonchi, or wheezes Cardio:   RRR without murmur or rub GI:  + BS, soft and nontender Extremities:  Right BKA, left foot with dressing, trace edema Access:  AVF @ RUA with + bruit  Lab Results:  Recent Labs  02/22/13 1015 02/23/13 0216  WBC 15.6* 16.9*  HGB 11.6* 11.4*  HCT 35.3* 35.1*  PLT 125* 147*   BMET:  Recent Labs  02/22/13 1100 02/23/13 0216  NA 133* 133*  K 4.2 4.4  CL 94* 95*  CO2 24 27  GLUCOSE 124* 76  BUN 19 25*  CREATININE 4.85* 5.88*  CALCIUM 8.4 8.5  ALBUMIN 2.4* 2.5*   No results found for this basename: PTH,  in the last 72 hours Iron Studies: No results found for this basename: IRON, TIBC, TRANSFERRIN, FERRITIN,  in the last 72 hours  Dialysis Orders: Center: [facility] on MWF.  EDW 91.5 kg HD Bath 2K/2.25Ca Time 4 hrs Heparin 2800 U. Access AVF @ R[facility] BFR 400 DFR 800 Hectorol 2 mcg IV/HD Epogen 0 Units IV/HD Venofer 0 Profile 2   Assessment/Plan: 1. PVD - s/p left femoral-popliteal bypass per Dr. 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  2. Arrhythmia - asymptomatic SVT, elevated troponin, evaluated by Cardiology (see note), no specific workup at this time.  3. Recent TIA - evaluated @ [facility] 6/2-3 for episode of confusion & expressive aphasia, believed to be TIA per Neurology.   4. ESRD - HD on MWF @ [facility]; K 4.4 pre-HD.  Next HD on 6/16..  5. Hypertension/volume - BP most recently 91/53, Midodrine 10 mg pre-HD; wt 94.1 kg (including boot) s/p net UF 0.8 L yesterday with EDW 91.5. 6. Anemia - Hgb 11.4 yesterday, last T-sat 31% (5/14) with ferritin 1385 (4/9), no outpatient Epogen or Fe.  7. Metabolic bone disease - Ca 8.5 (9.7 corrected), P 4.9, iPTH 264 (4/9); Hectorol 2 mcg, Sensipar 60 mg qd, Renvela 3 with meals.  8. Nutrition - Alb 2.5, renal diet, vitamin.  9. DM Type 2 - on Lantus & SSI.  10. Hx CAD/ischemic CM - s/p CABG x 3, cath with EF 25-30% 2011.  11. 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  12. 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     LOS: 4 days   LYLES,CHARLES 02/24/2013,6:47 AM   I have seen and examined this patient and agree with plan as outlined by Gerome Apley, PA-C. Zykerria Tanton A,MD 02/26/2013 6:34 PM

## 2013-02-24 NOTE — Discharge Summary (Signed)
[facility] Discharge Summary  [name] 1956-07-31 57 y.o. female  [id number]  Admission Date: 02/20/2013  Discharge Date: 02/24/13  Physician: Chuck Hint, MD  Admission Diagnosis: claudication   HPI:   This is a 57 y.o. female who has undergone previous right below the knee amputation. I saw her on consultation on 01/17/2013 with a nonhealing wound of the left heel. She had evidence of infrainguinal arterial occlusive disease and this was clearly a limb threatening problem. She has a complicated medical history including a history of end-stage renal disease on dialysis, diabetes, and Ischemic cardiomyopathy with an ejection fraction of 25-30%.  She underwent an arteriogram by Dr. Imogene Burn appear this showed moderate to severe disease in the distal superficial femoral artery with a 90% stenosis at Hunter's canal. She had disease in the popliteal artery including it stenosis below the knee. Her dominant runoff was the anterior tibial artery. As you tibial artery was occluded distally.  She comes in today with continued issues with the wound her left foot which has not shown any significant evidence of healing. He denies fever or chills.  Hospital Course:  The patient was admitted to the hospital and taken to the operating room on 02/20/2013 and underwent: 1. Left femoral to below knee popliteal artery bypass with non-reversed saphenous vein graft  2. Intraop agram    The pt tolerated the procedure well and was transported to the PACU in good condition. Later that day, she did have some low blood pressure with fluid carefully.  By POD 1, she was doing well.  She was started on Keflex for the dry gangrene of her left heel.  She did get a Prafo boot for her left leg.  She was transferred to 6700 on POD 1.  She is an ESRD pt and did receive HD postoperatively.  By POD 2, she did have some hypotension, but she says that her normal BP and denies CP or SOB and  just feels tired.  The RN reported some episodes of brief tachycardia.  Her pre op nuclear stress test showed evidence of moderate ischemia with EF 50%.  EKG and cardiac enzymes were obtained and cardiology was asked to see the pt.  She did have elevated troponin levels that likely represented type II myocardial injury and acute coronary syndrome was not felt to be present.  No further cardiac work up was felt to be needed.    By POD 4, the pt was doing well.  Dr. Hart Rochester spoke to the pt at length about discharge. Pt states that she is confident that she can go home and manage with HH. She states that her apartment is handicap accessible. After long discussion, we will discharge pt home.  The remainder of the hospital course consisted of increasing mobilization and increasing intake of solids without difficulty.  CBC    Component Value Date/Time   WBC 16.9* 02/23/2013 0216   RBC 4.09 02/23/2013 0216   HGB 11.4* 02/23/2013 0216   HCT 35.1* 02/23/2013 0216   PLT 147* 02/23/2013 0216   MCV 85.8 02/23/2013 0216   MCH 27.9 02/23/2013 0216   MCHC 32.5 02/23/2013 0216   RDW 15.7* 02/23/2013 0216   LYMPHSABS 1.4 02/13/2013 0605   MONOABS 1.2* 02/13/2013 0605   EOSABS 0.1 02/13/2013 0605   BASOSABS 0.1 02/13/2013 0605    BMET    Component Value Date/Time   NA 133* 02/23/2013 0216   NA 140 07/11/2012 1427   K 4.4  02/23/2013 0216   K 3.6 07/11/2012 1427   CL 95* 02/23/2013 0216   CL 98 07/11/2012 1427   CO2 27 02/23/2013 0216   CO2 32* 07/11/2012 1427   GLUCOSE 76 02/23/2013 0216   GLUCOSE 149* 07/11/2012 1427   BUN 25* 02/23/2013 0216   BUN 25.0 07/11/2012 1427   CREATININE 5.88* 02/23/2013 0216   CREATININE 5.9 Repeated and Verified* 07/11/2012 1427   CALCIUM 8.5 02/23/2013 0216   CALCIUM 9.4 07/11/2012 1427   GFRNONAA 7* 02/23/2013 0216   GFRAA 8* 02/23/2013 0216     Discharge Instructions:   The patient is discharged to home with extensive instructions on wound care and progressive ambulation.  They  are instructed not to drive or perform any heavy lifting until returning to see the physician in his office.  Discharge Orders   Future Appointments Provider Department Dept Phone   03/14/2013 11:30 AM Chuck Hint, MD [facility] [phone]   04/12/2013 9:00 AM York Spaniel, MD [facility] [phone]   05/07/2013 12:45 PM [facility] MEDICAL ON[facility]OG[facility] [phone]   05/17/2013 2:00 PM Samul Dada, MD [facility] MEDICAL ONCOLOGY [phone]   05/29/2013 10:00 AM Waymon Budge, MD [facility] [phone]   10/10/2013 1:00 PM [facility] [phone]   10/10/2013 1:30 PM Chuck Hint, MD [facility] [phone]   Future Orders Complete By Expires     Call MD for:  redness, tenderness, or signs of infection (pain, swelling, bleeding, redness, odor or green/yellow discharge around incision site)  As directed     Call MD for:  severe or increased pain, loss or decreased feeling  in affected limb(s)  As directed     Call MD for:  temperature >100.5  As directed     Discharge wound care:  As directed     Comments:      Wash the groin wound  daily with soap and water and pat dry.  Then put a dry gauze or washcloth there to keep this area dry daily and as needed.  Do not use Vaseline or neosporin on your incisions.  Only use soap and water on your incisions and then protect and keep dry.    Driving Restrictions  As directed     Comments:      No driving for 2 weeks    Lifting restrictions  As directed     Comments:      No lifting for 4 weeks    Resume previous diet  As directed        Discharge Diagnosis:  claudication  Secondary Diagnosis: Patient Active Problem List   Diagnosis Date Noted  . Atherosclerotic PVD with ulceration 02/20/2013  . CVA (cerebral infarction) 02/12/2013  .  Cardiomyopathy, ischemic 02/03/2013  . DM (diabetes mellitus), secondary, w/peripheral vascular complications 02/03/2013  . Pain in limb 01/31/2013  . Atherosclerosis of native arteries of the extremities with ulceration(440.23) 01/17/2013  . PVD (peripheral vascular disease) 10/04/2012  . MGUS (monoclonal gammopathy of unknown significance) 07/11/2012  . Infected sebaceous cyst of Right posterior neck. 09/30/2011  . Peripheral vascular disease, unspecified 07/14/2011  . Systemic lupus 06/01/2011  . HYPERLIPIDEMIA 07/28/2010  . HYPERTENSION 07/28/2010  . DIAB W/RENAL MANIFESTS TYPE II/UNS NOT UNCNTRL 07/27/2010  . OBSTRUCTIVE SLEEP APNEA 07/27/2010  . ABNORMAL HEART RHYTHMS 07/27/2010  . End stage renal disease 07/27/2010  Past Medical History  Diagnosis Date  . Diabetes mellitus, type 2   . Kidney disease     ESRD secondary to DM, failed transplant 2006. Dialysis  . Secondary hyperparathyroidism   . OSA (obstructive sleep apnea)     NPSG 11.23.11-AHI 64.9/hr-central and obstructive apnea  . Ischemic cardiomyopathy     EF 25-30% 2011  . CAD (coronary artery disease)   . Pulmonary hypertension     Est PAsyst 60-Dr Mendel Ryder, off coumadin, CT neg for PE  . Hyperlipidemia   . Anemia   . Thyroid disease   . Pulmonary edema   . Peripheral vascular disease   . Sebaceous cyst   . 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 Velvet Bathe  . Irregular heart beat   . Shortness of breath     with exertion  . Hemodialysis patient     M,W,F  . TIA (transient ischemic attack) June 2014  . Ulcer of heel due to diabetes     left       Medication List    TAKE these medications       acetaminophen 325 MG tablet  Commonly known as:  TYLENOL  Take 650 mg by mouth every 6 (six) hours as needed for pain.     atorvastatin 10 MG tablet  Commonly known as:  LIPITOR  Take 10 mg by mouth daily before breakfast.     B-complex with vitamin C tablet  Take 1 tablet by mouth daily.      cephALEXin 250 MG capsule  Commonly known as:  KEFLEX  Take 1 capsule (250 mg total) by mouth 2 (two) times daily.     cinacalcet 60 MG tablet  Commonly known as:  SENSIPAR  Take 60 mg by mouth daily before breakfast.     clopidogrel 75 MG tablet  Commonly known as:  PLAVIX  Take 1 tablet (75 mg total) by mouth daily.     collagenase ointment  Commonly known as:  SANTYL  Apply 1 application topically every other day.     ibuprofen 600 MG tablet  Commonly known as:  ADVIL,MOTRIN  Take 300 mg by mouth every 6 (six) hours as needed for pain.     insulin aspart 100 UNIT/ML injection  Commonly known as:  novoLOG  Inject 3-16 Units into the skin 3 (three) times daily before meals. Sliding scale     insulin glargine 100 UNIT/ML injection  Commonly known as:  LANTUS  Inject 12 Units into the skin at bedtime.     LYRICA 50 MG capsule  Generic drug:  pregabalin  Take 50 mg by mouth at bedtime.     midodrine 10 MG tablet  Commonly known as:  PROAMATINE  Take 10 mg by mouth 3 (three) times a week. Take on Monday, Wednesday, and Friday     oxyCODONE 5 MG immediate release tablet  Commonly known as:  Oxy IR/ROXICODONE  Take 1 tablet (5 mg total) by mouth every 8 (eight) hours as needed for pain.     sevelamer carbonate 800 MG tablet  Commonly known as:  RENVELA  Take 2,400 mg by mouth 3 (three) times daily with meals. 3 tabs by mouth three times a day     SYSTANE BALANCE OP  Place 1 drop into both eyes as needed (dry eyes). Dry eyes     tetrahydrozoline-zinc 0.05-0.25 % ophthalmic solution  Commonly known as:  VISINE-AC  Place 2 drops into both eyes 3 (three) times daily  as needed (for redness).        Roxicodone #30 No Refill Keflex 250 mg bid #40 NR given  Disposition: home  Patient's condition: is Good  Follow up: 1. Dr. Edilia Bo in 2 weeks   Doreatha Massed, PA-C [facility] [phone] 02/24/2013  10:15 AM  - For VQI Registry use  --- Instructions: Press F2 to tab through selections.  Delete question if not applicable.   Post-op:  Wound infection: No  Graft infection: No  Transfusion: No  If yes, n/a units given New Arrhythmia: Yes occasional salvos of atrial tachycardia-asymptomatic and less than 10 beats each occurrence. Ipsilateral amputation: [ ]  no, [ ]  Minor, [ ]  BKA, [ ]  AKA (not this admission-she had prior right AKA Discharge patency: [x ] Primary, [ ]  Primary assisted, [ ]  Secondary, [ ]  Occluded Patency judged by: [x ] Dopper only, [ ]  Palpable graft pulse, [ ]  Palpable distal pulse, [ ]  ABI inc. > 0.15, [ ]  Duplex-not obtained Discharge ABI: R not done, L not done Discharge TBI: R not done, L not done D/C Ambulatory Status: Ambulatory with Assistance  Complications: MI: [x ] No, [ ]  Troponin only, [ ]  EKG or Clinical CHF: No Resp failure: [n ] none, [ ]  Pneumonia, [ ]  Ventilator Chg in renal function: [ ]  none, [ ]  Inc. Cr > 0.5, [ ]  Temp. Dialysis, [x ] already on Permanent dialysis pre op Stroke: [x ] None, [ ]  Minor, [ ]  Major Return to OR: No  Reason for return to OR: [ ]  Bleeding, [ ]  Infection, [ ]  Thrombosis, [ ]  Revision  Discharge medications: Statin use:  Yes ASA use:  No Plavix use:  Yes Beta blocker use: No  for medical reason hypotension Coumadin use: No  for medical reason not indicated

## 2013-02-24 NOTE — Progress Notes (Addendum)
[facility] Progress Note  02/24/2013 9:30 AM 4 Days Post-Op  Subjective:  Pt states her leg hurts this morning.  Feels tight around the incision below the knee; pt states she does not have any help at home  Tm 100.5 now 100 VSS 99% RA  Filed Vitals:   02/24/13 0900  BP: 129/64  Pulse: 84  Temp: 100 F (37.8 C)  Resp: 18    Physical Exam: Incisions:  C/d/i; there is tightness around the below knee incision. Extremities:  Left foot is with protective boot  CBC    Component Value Date/Time   WBC 16.9* 02/23/2013 0216   RBC 4.09 02/23/2013 0216   HGB 11.4* 02/23/2013 0216   HCT 35.1* 02/23/2013 0216   PLT 147* 02/23/2013 0216   MCV 85.8 02/23/2013 0216   MCH 27.9 02/23/2013 0216   MCHC 32.5 02/23/2013 0216   RDW 15.7* 02/23/2013 0216   LYMPHSABS 1.4 02/13/2013 0605   MONOABS 1.2* 02/13/2013 0605   EOSABS 0.1 02/13/2013 0605   BASOSABS 0.1 02/13/2013 0605    BMET    Component Value Date/Time   NA 133* 02/23/2013 0216   NA 140 07/11/2012 1427   K 4.4 02/23/2013 0216   K 3.6 07/11/2012 1427   CL 95* 02/23/2013 0216   CL 98 07/11/2012 1427   CO2 27 02/23/2013 0216   CO2 32* 07/11/2012 1427   GLUCOSE 76 02/23/2013 0216   GLUCOSE 149* 07/11/2012 1427   BUN 25* 02/23/2013 0216   BUN 25.0 07/11/2012 1427   CREATININE 5.88* 02/23/2013 0216   CREATININE 5.9 Repeated and Verified* 07/11/2012 1427   CALCIUM 8.5 02/23/2013 0216   CALCIUM 9.4 07/11/2012 1427   GFRNONAA 7* 02/23/2013 0216   GFRAA 8* 02/23/2013 0216    INR    Component Value Date/Time   INR 1.29 02/19/2013 1536     Intake/Output Summary (Last 24 hours) at 02/24/13 0930 Last data filed at 02/24/13 0900  Gross per 24 hour  Intake    970 ml  Output    800 ml  Net    170 ml     Assessment/Plan:  57 y.o. female is s/p:  1. Left femoral to below knee popliteal artery bypass with non-reversed saphenous vein graft  2. Intraop agram   4 Days Post-Op  -pt has social work consult in progress for SNF as  she does not have help at home-PT saw pt this am and recommends CIR-will consult. -DVT prophylaxis:  Pt is not on lovenox-will start renal dose Lovenox -continue ABx for dry gangrene of left heel -continue to mobilize -discussed with pt importance of keeping groin dry and she expressed understanding-will have dry dressing placed to left groin daily and as needed.   Doreatha Massed, PA-C [facility] [phone] 02/24/2013 9:30 AM  Dr. Hart Rochester spoke to the pt at length about discharge.  Pt states that she is confident that she can go home and manage with HH.  She states that her apartment is handicap accessible.  After long discussion, we will discharge pt home.  Doreatha Massed 02/24/2013 10:12 AM   Patient desires to be discharged home today and not to go to CIR She feels very confident that she can manage at home Left femoral-popliteal graft functioning well  Plan DC home today return in 2 weeks for followup with Dr. Durwin Nora

## 2013-02-26 NOTE — Discharge Summary (Signed)
Agree with plans for discharge.  Waverly Ferrari, MD, FACS Beeper [phone] 02/26/2013

## 2013-02-26 NOTE — Progress Notes (Signed)
   CARE MANAGEMENT NOTE 02/26/2013  Patient:  [name], [name]   Account Number:  [id number]  Date Initiated:  02/21/2013  Documentation initiated by:  Donn Pierini  Subjective/Objective Assessment:   Pt admitted s/p BYPASS GRAFT FEMORAL-POPLITEAL ARTERY (Left)     Action/Plan:   PTA pt lived home alone- PT/OT evals ordered   Anticipated DC Date:  02/23/2013   Anticipated DC Plan:  HOME W HOME HEALTH SERVICES      DC Planning Services  CM consult      [facility] Choice  Resumption Of Svcs/PTA Provider   Choice offered to / List presented to:          [facility] arranged  HH-2 PT  HH-1 RN      [facility]  [facility]   Status of service:  Completed, signed off Medicare Important Message given?   (If response is "NO", the following Medicare IM given date fields will be blank) Date Medicare IM given:   Date Additional Medicare IM given:    Discharge Disposition:  HOME W HOME HEALTH SERVICES  Per UR Regulation:  Reviewed for med. necessity/level of care/duration of stay  If discussed at Long Length of Stay Meetings, dates discussed:    Comments:  02/26/2013 1000 Attempted call to pt and unable to leave voicemail ( no message). Contacted [facility] and they are servicing her for [facility]. Have pt soc for 02/27/2013. Isidoro Donning RN CCM Case Mgmt phone [phone]  02/21/13- 1200- Donn Pierini RN, BSN [phone] Pt in HD this am, PT/OT evals pending- tx order in for 2000- NCM to follow for recommendations per PT/OT for d/c needs and planning

## 2013-02-27 ENCOUNTER — Other Ambulatory Visit: Payer: Self-pay | Admitting: *Deleted

## 2013-03-05 ENCOUNTER — Other Ambulatory Visit: Payer: Self-pay | Admitting: *Deleted

## 2013-03-05 DIAGNOSIS — I739 Peripheral vascular disease, unspecified: Secondary | ICD-10-CM

## 2013-03-05 DIAGNOSIS — Z89519 Acquired absence of unspecified leg below knee: Secondary | ICD-10-CM

## 2013-03-05 DIAGNOSIS — Z48812 Encounter for surgical aftercare following surgery on the circulatory system: Secondary | ICD-10-CM

## 2013-03-07 ENCOUNTER — Telehealth: Payer: Self-pay

## 2013-03-07 ENCOUNTER — Ambulatory Visit (INDEPENDENT_AMBULATORY_CARE_PROVIDER_SITE_OTHER): Payer: Medicare Other | Admitting: Vascular Surgery

## 2013-03-07 ENCOUNTER — Encounter: Payer: Self-pay | Admitting: Vascular Surgery

## 2013-03-07 ENCOUNTER — Encounter (HOSPITAL_BASED_OUTPATIENT_CLINIC_OR_DEPARTMENT_OTHER): Payer: Medicare Other | Attending: General Surgery

## 2013-03-07 VITALS — BP 130/73 | HR 97 | Temp 98.8°F | Ht 69.0 in | Wt 208.6 lb

## 2013-03-07 DIAGNOSIS — S88119A Complete traumatic amputation at level between knee and ankle, unspecified lower leg, initial encounter: Secondary | ICD-10-CM | POA: Insufficient documentation

## 2013-03-07 DIAGNOSIS — E1169 Type 2 diabetes mellitus with other specified complication: Secondary | ICD-10-CM | POA: Insufficient documentation

## 2013-03-07 DIAGNOSIS — Z992 Dependence on renal dialysis: Secondary | ICD-10-CM | POA: Insufficient documentation

## 2013-03-07 DIAGNOSIS — Z79899 Other long term (current) drug therapy: Secondary | ICD-10-CM | POA: Insufficient documentation

## 2013-03-07 DIAGNOSIS — I739 Peripheral vascular disease, unspecified: Secondary | ICD-10-CM

## 2013-03-07 DIAGNOSIS — N2581 Secondary hyperparathyroidism of renal origin: Secondary | ICD-10-CM | POA: Insufficient documentation

## 2013-03-07 DIAGNOSIS — Y838 Other surgical procedures as the cause of abnormal reaction of the patient, or of later complication, without mention of misadventure at the time of the procedure: Secondary | ICD-10-CM | POA: Insufficient documentation

## 2013-03-07 DIAGNOSIS — L97409 Non-pressure chronic ulcer of unspecified heel and midfoot with unspecified severity: Secondary | ICD-10-CM | POA: Insufficient documentation

## 2013-03-07 DIAGNOSIS — I129 Hypertensive chronic kidney disease with stage 1 through stage 4 chronic kidney disease, or unspecified chronic kidney disease: Secondary | ICD-10-CM | POA: Insufficient documentation

## 2013-03-07 DIAGNOSIS — Z794 Long term (current) use of insulin: Secondary | ICD-10-CM | POA: Insufficient documentation

## 2013-03-07 DIAGNOSIS — N189 Chronic kidney disease, unspecified: Secondary | ICD-10-CM | POA: Insufficient documentation

## 2013-03-07 DIAGNOSIS — T8189XA Other complications of procedures, not elsewhere classified, initial encounter: Secondary | ICD-10-CM | POA: Insufficient documentation

## 2013-03-07 DIAGNOSIS — I2589 Other forms of chronic ischemic heart disease: Secondary | ICD-10-CM | POA: Insufficient documentation

## 2013-03-07 MED ORDER — OXYCODONE-ACETAMINOPHEN 5-325 MG PO TABS: 1.0000 | ORAL_TABLET | ORAL | Status: DC | PRN

## 2013-03-07 NOTE — Telephone Encounter (Signed)
Pt. Called to request earlier follow-up appt., due to opening in 3 incisional areas.  States the left groin has a pea-size opening, the left upper thigh has a small opening, and the incision below the left knee is starting to open-up.  Reports "a yellow drainage."  States continues to have swelling in the left leg.  Denies fever or chills.    Discussed w/ Dr. Edilia Bo.  Pt. to be brought in for office evaluation today. Pt. Made aware.

## 2013-03-07 NOTE — Progress Notes (Signed)
[facility] [place]  Patient name: [name] MRN: [id number] DOB: 06/21/1956 Sex: female  REASON FOR VISIT: follow up after left femoropopliteal bypass.  HPI: [name] is a 57 y.o. female who underwent a left femoral to below knee popliteal artery bypass with a vein graft on 02/20/2013. She presented with a nonhealing left heel wound. She has severe tibial artery occlusive disease and this was felt to be her only chance for limb salvage.  She comes in today because she's had some problems with wound healing.   REVIEW OF SYSTEMS: Arly.Keller ] denotes positive finding; [  ] denotes negative finding  CARDIOVASCULAR:  [ ]  chest pain   [ ]  dyspnea on exertion    CONSTITUTIONAL:  [ ]  fever   [ ]  chills  PHYSICAL EXAM: Filed Vitals:   03/07/13 1526  BP: 130/73  Pulse: 97  Temp: 98.8 F (37.1 C)  TempSrc: Oral  Height: 5\' 9"  (1.753 m)  Weight: 208 lb 9.6 oz (94.62 kg)  SpO2: 100%   Body mass index is 30.79 kg/(m^2). GENERAL: The patient is a well-nourished female, in no acute distress. The vital signs are documented above. CARDIOVASCULAR: There is a regular rate and rhythm  PULMONARY: There is good air exchange bilaterally without wheezing or rales. Her left foot is warm and well-perfused. She has some separation of the groin incision and the proximal fine incision which I debrided. This will be packed wet to dry. The incision below the knee is slightly separated with no significant drainage.  MEDICAL ISSUES: She will need twice a day dressing changes by the home health nurse. She has had frequent problems with wound healing in the past. Nutrition may also be an issue have encouraged her to do her best bleeding although she states this is a problem because of a poor appetite. Plan on seeing her back in 3 weeks. She knows to call sooner if she has problems.  Lubertha Leite S [facility] [place] Beeper: [phone]

## 2013-03-09 NOTE — Progress Notes (Signed)
[facility]  NAME:  LIV, RALLIS NO.:  [id number]  MEDICAL RECORD NO.:  [id number]      DATE OF BIRTH:  [birth year]  PHYSICIAN:  Ardath Sax, M.D.           VISIT DATE:                                  OFFICE VISIT   [name] is a 57 year old diabetic lady, who is on dialysis who has recently undergone extensive arterial reconstruction on her left leg. She is diabetic and has been on dialysis for many years.  She also has ischemic cardiomyopathy and hypertension.  She also has secondary hyperparathyroidism.  The lady has already had a right below-knee amputation.  She presents with a [address] 3 diabetic foot ulcer on her left heel and all of the wounds from her vascular reconstruction are open down to the subcu with drainage.  I debrided all of the dead skin off of the heel and we are going to treat her with Santyl dressings.  We are also putting Santyl on all of the surgical wounds.  She will come back in a week.  So her diagnoses are type 2 diabetes, renal failure on dialysis, peripheral vascular disease, recent vascular reconstruction of the left leg and has nonhealing surgical wounds and also has a [address] 3 diabetic ulcer on her left heel.  Her medicines include Lipitor, NovoLog insulin, Lyrica.  She is also on oxycodone and Keflex.  So we will continue her on the same medicines and see her in a week.     Ardath Sax, M.D.     PP/MEDQ  D:  03/08/2013  T:  03/09/2013  Job:  [id number]

## 2013-03-14 ENCOUNTER — Telehealth: Payer: Self-pay

## 2013-03-14 ENCOUNTER — Ambulatory Visit: Payer: Medicare Other | Admitting: Vascular Surgery

## 2013-03-14 DIAGNOSIS — T814XXD Infection following a procedure, subsequent encounter: Secondary | ICD-10-CM

## 2013-03-14 MED ORDER — CEPHALEXIN 250 MG PO CAPS: 250.0000 mg | ORAL_CAPSULE | Freq: Two times a day (BID) | ORAL | Status: DC

## 2013-03-14 NOTE — Telephone Encounter (Signed)
Discussed pt's wound status with Dr. Edilia Bo.  Advised to arrange for the Home Care Nurse to do daily wound care with NS wet to dry dressings.  Also, recommends to restart Keflex 250 mg bid x 14 days.  Called pt. And advised of Dr. Adele Dan recommendations.  Pt. also requested a new order for a wheelchair; stated her physical therapist is recommending this.   Notified [facility] of need to "increase wound care to daily, instead of 3x/week".  Requested that the physical therapist call the office to give information about need for new order for wheelchair.

## 2013-03-14 NOTE — Telephone Encounter (Signed)
Pt. Called to give update on her left groin and upper inner thigh wound; states it remains open; has slight odor to the yellow-green drainage from the wound.  Denies any redness of the tissue in the peri-wound area; denies any fever/chills.  Questions if Dr. Edilia Bo wants her to continue taking an antibiotic.  States she ran out of Keflex on Sunday.   Will discuss w/ Dr. Edilia Bo.

## 2013-03-15 ENCOUNTER — Encounter (HOSPITAL_BASED_OUTPATIENT_CLINIC_OR_DEPARTMENT_OTHER): Payer: Medicare Other | Attending: General Surgery

## 2013-03-15 DIAGNOSIS — L97509 Non-pressure chronic ulcer of other part of unspecified foot with unspecified severity: Secondary | ICD-10-CM | POA: Insufficient documentation

## 2013-03-15 DIAGNOSIS — I739 Peripheral vascular disease, unspecified: Secondary | ICD-10-CM | POA: Insufficient documentation

## 2013-03-15 DIAGNOSIS — E1169 Type 2 diabetes mellitus with other specified complication: Secondary | ICD-10-CM | POA: Insufficient documentation

## 2013-03-25 ENCOUNTER — Emergency Department (HOSPITAL_COMMUNITY): Payer: Medicare Other

## 2013-03-25 ENCOUNTER — Encounter (HOSPITAL_COMMUNITY): Payer: Self-pay | Admitting: Emergency Medicine

## 2013-03-25 ENCOUNTER — Inpatient Hospital Stay (HOSPITAL_COMMUNITY)
Admission: EM | Admit: 2013-03-25 | Discharge: 2013-03-30 | DRG: 853 | Disposition: A | Discharge status: Rehabilitation Facility | Payer: Medicare Other | Attending: Internal Medicine | Admitting: Internal Medicine

## 2013-03-25 DIAGNOSIS — E785 Hyperlipidemia, unspecified: Secondary | ICD-10-CM

## 2013-03-25 DIAGNOSIS — N186 End stage renal disease: Secondary | ICD-10-CM

## 2013-03-25 DIAGNOSIS — A419 Sepsis, unspecified organism: Secondary | ICD-10-CM

## 2013-03-25 DIAGNOSIS — M79609 Pain in unspecified limb: Secondary | ICD-10-CM

## 2013-03-25 DIAGNOSIS — I7025 Atherosclerosis of native arteries of other extremities with ulceration: Secondary | ICD-10-CM | POA: Diagnosis present

## 2013-03-25 DIAGNOSIS — D631 Anemia in chronic kidney disease: Secondary | ICD-10-CM

## 2013-03-25 DIAGNOSIS — M86179 Other acute osteomyelitis, unspecified ankle and foot: Secondary | ICD-10-CM | POA: Diagnosis present

## 2013-03-25 DIAGNOSIS — M949 Disorder of cartilage, unspecified: Secondary | ICD-10-CM | POA: Diagnosis present

## 2013-03-25 DIAGNOSIS — I639 Cerebral infarction, unspecified: Secondary | ICD-10-CM

## 2013-03-25 DIAGNOSIS — Z992 Dependence on renal dialysis: Secondary | ICD-10-CM

## 2013-03-25 DIAGNOSIS — Z794 Long term (current) use of insulin: Secondary | ICD-10-CM

## 2013-03-25 DIAGNOSIS — L089 Local infection of the skin and subcutaneous tissue, unspecified: Secondary | ICD-10-CM

## 2013-03-25 DIAGNOSIS — I25810 Atherosclerosis of coronary artery bypass graft(s) without angina pectoris: Secondary | ICD-10-CM | POA: Diagnosis present

## 2013-03-25 DIAGNOSIS — I10 Essential (primary) hypertension: Secondary | ICD-10-CM

## 2013-03-25 DIAGNOSIS — E1165 Type 2 diabetes mellitus with hyperglycemia: Secondary | ICD-10-CM | POA: Diagnosis present

## 2013-03-25 DIAGNOSIS — I120 Hypertensive chronic kidney disease with stage 5 chronic kidney disease or end stage renal disease: Secondary | ICD-10-CM | POA: Diagnosis present

## 2013-03-25 DIAGNOSIS — R5381 Other malaise: Secondary | ICD-10-CM | POA: Diagnosis present

## 2013-03-25 DIAGNOSIS — I2589 Other forms of chronic ischemic heart disease: Secondary | ICD-10-CM | POA: Diagnosis present

## 2013-03-25 DIAGNOSIS — M869 Osteomyelitis, unspecified: Secondary | ICD-10-CM

## 2013-03-25 DIAGNOSIS — M86172 Other acute osteomyelitis, left ankle and foot: Secondary | ICD-10-CM

## 2013-03-25 DIAGNOSIS — E1351 Other specified diabetes mellitus with diabetic peripheral angiopathy without gangrene: Secondary | ICD-10-CM

## 2013-03-25 DIAGNOSIS — I255 Ischemic cardiomyopathy: Secondary | ICD-10-CM

## 2013-03-25 DIAGNOSIS — L97809 Non-pressure chronic ulcer of other part of unspecified lower leg with unspecified severity: Secondary | ICD-10-CM | POA: Diagnosis present

## 2013-03-25 DIAGNOSIS — L97409 Non-pressure chronic ulcer of unspecified heel and midfoot with unspecified severity: Secondary | ICD-10-CM | POA: Diagnosis present

## 2013-03-25 DIAGNOSIS — E1159 Type 2 diabetes mellitus with other circulatory complications: Secondary | ICD-10-CM | POA: Diagnosis present

## 2013-03-25 DIAGNOSIS — Z951 Presence of aortocoronary bypass graft: Secondary | ICD-10-CM

## 2013-03-25 DIAGNOSIS — N039 Chronic nephritic syndrome with unspecified morphologic changes: Secondary | ICD-10-CM | POA: Diagnosis present

## 2013-03-25 DIAGNOSIS — I2789 Other specified pulmonary heart diseases: Secondary | ICD-10-CM | POA: Diagnosis present

## 2013-03-25 DIAGNOSIS — E1129 Type 2 diabetes mellitus with other diabetic kidney complication: Secondary | ICD-10-CM

## 2013-03-25 DIAGNOSIS — M329 Systemic lupus erythematosus, unspecified: Secondary | ICD-10-CM | POA: Diagnosis present

## 2013-03-25 DIAGNOSIS — M899 Disorder of bone, unspecified: Secondary | ICD-10-CM | POA: Diagnosis present

## 2013-03-25 DIAGNOSIS — L98499 Non-pressure chronic ulcer of skin of other sites with unspecified severity: Secondary | ICD-10-CM

## 2013-03-25 DIAGNOSIS — Z9181 History of falling: Secondary | ICD-10-CM

## 2013-03-25 DIAGNOSIS — I739 Peripheral vascular disease, unspecified: Secondary | ICD-10-CM

## 2013-03-25 DIAGNOSIS — I2510 Atherosclerotic heart disease of native coronary artery without angina pectoris: Secondary | ICD-10-CM

## 2013-03-25 DIAGNOSIS — I499 Cardiac arrhythmia, unspecified: Secondary | ICD-10-CM

## 2013-03-25 DIAGNOSIS — G4733 Obstructive sleep apnea (adult) (pediatric): Secondary | ICD-10-CM

## 2013-03-25 DIAGNOSIS — S88119A Complete traumatic amputation at level between knee and ankle, unspecified lower leg, initial encounter: Secondary | ICD-10-CM

## 2013-03-25 DIAGNOSIS — I96 Gangrene, not elsewhere classified: Secondary | ICD-10-CM | POA: Diagnosis present

## 2013-03-25 DIAGNOSIS — M9080 Osteopathy in diseases classified elsewhere, unspecified site: Secondary | ICD-10-CM | POA: Diagnosis present

## 2013-03-25 DIAGNOSIS — D472 Monoclonal gammopathy: Secondary | ICD-10-CM

## 2013-03-25 DIAGNOSIS — N2581 Secondary hyperparathyroidism of renal origin: Secondary | ICD-10-CM

## 2013-03-25 DIAGNOSIS — E669 Obesity, unspecified: Secondary | ICD-10-CM | POA: Diagnosis present

## 2013-03-25 DIAGNOSIS — I70209 Unspecified atherosclerosis of native arteries of extremities, unspecified extremity: Secondary | ICD-10-CM | POA: Diagnosis present

## 2013-03-25 LAB — MAGNESIUM: Magnesium: 2.2 mg/dL (ref 1.5–2.5)

## 2013-03-25 LAB — CBC WITH DIFFERENTIAL/PLATELET
Eosinophils Absolute: 0.1 10*3/uL (ref 0.0–0.7)
Eosinophils Relative: 1 % (ref 0–5)
Hemoglobin: 11.8 g/dL — ABNORMAL LOW (ref 12.0–15.0)
Lymphocytes Relative: 8 % — ABNORMAL LOW (ref 12–46)
Lymphs Abs: 1.2 10*3/uL (ref 0.7–4.0)
MCH: 28.2 pg (ref 26.0–34.0)
MCV: 84 fL (ref 78.0–100.0)
Monocytes Relative: 8 % (ref 3–12)
RBC: 4.19 MIL/uL (ref 3.87–5.11)
WBC: 13.9 10*3/uL — ABNORMAL HIGH (ref 4.0–10.5)

## 2013-03-25 LAB — BASIC METABOLIC PANEL
BUN: 23 mg/dL (ref 6–23)
CO2: 31 mEq/L (ref 19–32)
Calcium: 9.9 mg/dL (ref 8.4–10.5)
GFR calc non Af Amer: 11 mL/min — ABNORMAL LOW (ref >90)
Glucose, Bld: 125 mg/dL — ABNORMAL HIGH (ref 70–99)
Potassium: 3.8 mEq/L (ref 3.5–5.1)

## 2013-03-25 LAB — PHOSPHORUS: Phosphorus: 2.9 mg/dL (ref 2.3–4.6)

## 2013-03-25 LAB — CG4 I-STAT (LACTIC ACID): Lactic Acid, Venous: 1.83 mmol/L (ref 0.5–2.2)

## 2013-03-25 LAB — GLUCOSE, CAPILLARY: Glucose-Capillary: 102 mg/dL — ABNORMAL HIGH (ref 70–99)

## 2013-03-25 LAB — C-REACTIVE PROTEIN: CRP: 12.4 mg/dL — ABNORMAL HIGH (ref <0.60)

## 2013-03-25 MED ORDER — CINACALCET HCL 30 MG PO TABS: 60.0000 mg | ORAL_TABLET | Freq: Every day | ORAL | Status: DC

## 2013-03-25 MED ORDER — SODIUM CHLORIDE 0.9 % IJ SOLN
3.0000 mL | Freq: Two times a day (BID) | INTRAMUSCULAR | Status: DC
Start: 2013-03-25 — End: 2013-03-30
  Administered 2013-03-25 – 2013-03-30 (×11): 3 mL via INTRAVENOUS

## 2013-03-25 MED ORDER — SODIUM CHLORIDE 0.9 % IV SOLN
Freq: Once | INTRAVENOUS | Status: AC
  Administered 2013-03-25: 11:00:00 via INTRAVENOUS

## 2013-03-25 MED ORDER — ATORVASTATIN CALCIUM 10 MG PO TABS
10.0000 mg | ORAL_TABLET | Freq: Every day | ORAL | Status: DC
  Administered 2013-03-26 – 2013-03-30 (×5): 10 mg via ORAL
  Filled 2013-03-25 (×7): qty 1

## 2013-03-25 MED ORDER — INSULIN GLARGINE 100 UNIT/ML ~~LOC~~ SOLN
6.0000 [IU] | Freq: Every day | SUBCUTANEOUS | Status: DC
  Administered 2013-03-27 – 2013-03-29 (×2): 6 [IU] via SUBCUTANEOUS
  Filled 2013-03-25 (×6): qty 0.06

## 2013-03-25 MED ORDER — VANCOMYCIN HCL IN DEXTROSE 1-5 GM/200ML-% IV SOLN
1000.0000 mg | INTRAVENOUS | Status: DC
  Administered 2013-03-26 – 2013-03-28 (×2): 1000 mg via INTRAVENOUS
  Filled 2013-03-25 (×2): qty 200

## 2013-03-25 MED ORDER — COLLAGENASE 250 UNIT/GM EX OINT
1.0000 "application " | TOPICAL_OINTMENT | CUTANEOUS | Status: DC
  Administered 2013-03-26: 1 via TOPICAL
  Filled 2013-03-25: qty 30

## 2013-03-25 MED ORDER — INSULIN ASPART 100 UNIT/ML ~~LOC~~ SOLN
0.0000 [IU] | Freq: Three times a day (TID) | SUBCUTANEOUS | Status: DC
  Administered 2013-03-29 (×2): 1 [IU] via SUBCUTANEOUS

## 2013-03-25 MED ORDER — NAPHAZOLINE HCL 0.1 % OP SOLN
1.0000 [drp] | Freq: Four times a day (QID) | OPHTHALMIC | Status: DC | PRN
  Filled 2013-03-25: qty 15

## 2013-03-25 MED ORDER — ONDANSETRON HCL 4 MG PO TABS: 4.0000 mg | ORAL_TABLET | Freq: Four times a day (QID) | ORAL | Status: DC | PRN

## 2013-03-25 MED ORDER — BIOTENE DRY MOUTH MT LIQD
15.0000 mL | Freq: Two times a day (BID) | OROMUCOSAL | Status: DC
  Administered 2013-03-25 – 2013-03-29 (×7): 15 mL via OROMUCOSAL

## 2013-03-25 MED ORDER — MIDODRINE HCL 5 MG PO TABS
10.0000 mg | ORAL_TABLET | ORAL | Status: DC
  Administered 2013-03-26 – 2013-03-30 (×3): 10 mg via ORAL
  Filled 2013-03-25 (×3): qty 2

## 2013-03-25 MED ORDER — PIPERACILLIN-TAZOBACTAM IN DEX 2-0.25 GM/50ML IV SOLN
2.2500 g | Freq: Three times a day (TID) | INTRAVENOUS | Status: DC
  Administered 2013-03-25 – 2013-03-28 (×8): 2.25 g via INTRAVENOUS
  Filled 2013-03-25 (×11): qty 50

## 2013-03-25 MED ORDER — SEVELAMER CARBONATE 800 MG PO TABS
2400.0000 mg | ORAL_TABLET | Freq: Three times a day (TID) | ORAL | Status: DC
  Administered 2013-03-25 – 2013-03-26 (×2): 2400 mg via ORAL
  Filled 2013-03-25 (×5): qty 3

## 2013-03-25 MED ORDER — HEPARIN SODIUM (PORCINE) 5000 UNIT/ML IJ SOLN
5000.0000 [IU] | Freq: Three times a day (TID) | INTRAMUSCULAR | Status: DC
  Administered 2013-03-25 – 2013-03-26 (×5): 5000 [IU] via SUBCUTANEOUS
  Filled 2013-03-25 (×9): qty 1

## 2013-03-25 MED ORDER — OXYCODONE HCL 5 MG PO TABS
5.0000 mg | ORAL_TABLET | Freq: Four times a day (QID) | ORAL | Status: DC | PRN
  Administered 2013-03-25 – 2013-03-30 (×7): 5 mg via ORAL
  Filled 2013-03-25 (×5): qty 1

## 2013-03-25 MED ORDER — HYDROMORPHONE HCL PF 1 MG/ML IJ SOLN
1.0000 mg | Freq: Once | INTRAMUSCULAR | Status: AC
  Administered 2013-03-25: 1 mg via INTRAVENOUS
  Filled 2013-03-25: qty 1

## 2013-03-25 MED ORDER — CLOPIDOGREL BISULFATE 75 MG PO TABS
75.0000 mg | ORAL_TABLET | Freq: Every day | ORAL | Status: DC
  Filled 2013-03-25: qty 1

## 2013-03-25 MED ORDER — ONDANSETRON HCL 4 MG/2ML IJ SOLN: 4.0000 mg | Freq: Four times a day (QID) | INTRAMUSCULAR | Status: DC | PRN

## 2013-03-25 MED ORDER — VANCOMYCIN HCL 500 MG IV SOLR
500.0000 mg | Freq: Once | INTRAVENOUS | Status: AC
  Administered 2013-03-25: 500 mg via INTRAVENOUS
  Filled 2013-03-25: qty 500

## 2013-03-25 MED ORDER — CINACALCET HCL 30 MG PO TABS
60.0000 mg | ORAL_TABLET | Freq: Every day | ORAL | Status: DC
  Administered 2013-03-25 – 2013-03-30 (×6): 60 mg via ORAL
  Filled 2013-03-25 (×7): qty 2

## 2013-03-25 MED ORDER — B COMPLEX-C PO TABS
1.0000 | ORAL_TABLET | Freq: Every day | ORAL | Status: DC
  Administered 2013-03-25 – 2013-03-26 (×2): 1 via ORAL
  Filled 2013-03-25 (×2): qty 1

## 2013-03-25 MED ORDER — ACETAMINOPHEN 325 MG PO TABS
650.0000 mg | ORAL_TABLET | Freq: Four times a day (QID) | ORAL | Status: DC | PRN
  Administered 2013-03-26: 650 mg via ORAL
  Filled 2013-03-25: qty 2

## 2013-03-25 NOTE — Consult Note (Signed)
[facility] [place] HISTORY AND PHYSICAL  Reason for consult: Left heel osteomyelitis Requesting: Tat MD History of Present Illness:  Patient is a 57 y.o. year old female who presents for evaluation of osteomyelitis left heel.  Pt had bypass by my partner Dr Edilia Bo 6/10 for non healing wound left heel.  She returned to the ER today after a fall.  She has been treating the heel ulcer as well as 2 leg incisions that have been healing poorly with Santyl.   She denies pain in foot.  She denies fever or chills.  Other medical problems include ESRD MWF, diabetes, prior right BKA,  Cardiomyopathy, pulmonary hypertension, hyperlipidemia all currently stable.  She was placed on Zosyn today.  Past Medical History  Diagnosis Date  . Diabetes mellitus, type 2   . Secondary hyperparathyroidism   . OSA (obstructive sleep apnea)     NPSG 11.23.11-AHI 64.9/hr-central and obstructive apnea  . Ischemic cardiomyopathy     EF 25-30% 2011  . CAD (coronary artery disease)   . Pulmonary hypertension     Est PAsyst 60-Dr Mendel Ryder, off coumadin, CT neg for PE  . Hyperlipidemia   . Anemia   . Thyroid disease   . Pulmonary edema   . Peripheral vascular disease   . Sebaceous cyst   . 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 Velvet Bathe  . Irregular heart beat   . Shortness of breath     with exertion  . Hemodialysis patient     M,W,F  . TIA (transient ischemic attack) June 2014  . Ulcer of heel due to diabetes     left  . Kidney disease Dialysis MWF    ESRD secondary to DM, failed transplant 2006. Dialysis    Past Surgical History  Procedure Laterality Date  . Coronary artery bypass graft  2011    3V  . Insertion of dialysis catheter    . 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 Nasal mucosal biopsy    . Ovarian cyst surgery    . 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 Laser retinal surgery  2011  . Kidney transplant      rejection  . Abdominal angiogram Left 01/25/13  . Colonoscopy    . Cardiac catheterization    . Eye surgery    . Femoral-popliteal bypass graft Left 02/20/2013    Procedure:  FEMORAL-to below knee POPLITEAL ARTERY bypasswith saphenous vein with intraoperative angiogram ;  Surgeon: Chuck Hint, MD;  Location: [facility] OR;  Service: Vascular;  Laterality: Left;  fem-below knee pop     Social History History  Substance Use Topics  . Smoking status: Never Smoker   . Smokeless tobacco: Never Used  . Alcohol Use: No    Family History Family History  Problem Relation Age of Onset  . Heart disease    . Edema Father     fluid overload  . Cerebral aneurysm Mother   . Stroke Brother   . Heart disease Brother     Allergies  Allergies  Allergen Reactions  . Neurontin (Gabapentin) Anaphylaxis  . Ezetimibe-Simvastatin Other (See Comments)    REACTION: face edema; itching  . Klonopin (Clonazepam) Other (See Comments)    hallucination  . Sertraline Hcl Other (See Comments)    hallucination     Current Facility-Administered Medications  Medication Dose Route Frequency Provider Last Rate Last Dose  . acetaminophen (TYLENOL) tablet  650 mg  650 mg Oral Q6H PRN Catarina Hartshorn, MD      . Melene Muller ON 03/26/2013] atorvastatin (LIPITOR) tablet 10 mg  10 mg Oral QAC breakfast Catarina Hartshorn, MD      . B-complex with vitamin C tablet 1 tablet  1 tablet Oral Daily Catarina Hartshorn, MD      . cinacalcet (SENSIPAR) tablet 60 mg  60 mg Oral Q breakfast Catarina Hartshorn, MD      . clopidogrel (PLAVIX) tablet 75 mg  75 mg Oral Daily Catarina Hartshorn, MD      . Melene Muller ON 03/26/2013] collagenase (SANTYL) ointment 1 application  1 application Topical QODAY David Tat, MD      . heparin injection 5,000 Units  5,000 Units Subcutaneous Q8H David Tat, MD      . insulin aspart (novoLOG) injection 0-9 Units  0-9 Units Subcutaneous TID WC Catarina Hartshorn, MD      . insulin glargine (LANTUS) injection 6 Units  6 Units Subcutaneous QHS Catarina Hartshorn,  MD      . Melene Muller ON 03/26/2013] midodrine (PROAMATINE) tablet 10 mg  10 mg Oral 3 times weekly Onalee Hua Tat, MD      . naphazoline (NAPHCON) 0.1 % ophthalmic solution 1 drop  1 drop Both Eyes QID PRN Catarina Hartshorn, MD      . ondansetron (ZOFRAN) tablet 4 mg  4 mg Oral Q6H PRN Catarina Hartshorn, MD       Or  . ondansetron (ZOFRAN) injection 4 mg  4 mg Intravenous Q6H PRN Catarina Hartshorn, MD      . oxyCODONE (Oxy IR/ROXICODONE) immediate release tablet 5 mg  5 mg Oral Q6H PRN Catarina Hartshorn, MD   5 mg at 03/25/13 1411  . piperacillin-tazobactam (ZOSYN) IVPB 2.25 g  2.25 g Intravenous Q8H David Tat, MD      . sevelamer carbonate (RENVELA) tablet 2,400 mg  2,400 mg Oral TID WC David Tat, MD      . sodium chloride 0.9 % injection 3 mL  3 mL Intravenous Q12H Onalee Hua Tat, MD        ROS:   General:  No weight loss, Fever, chills  HEENT: No recent headaches, no nasal bleeding, no visual changes, no sore throat  Neurologic: No dizziness, blackouts, seizures. No recent symptoms of stroke or mini- stroke. No recent episodes of slurred speech, or temporary blindness.  Cardiac: No recent episodes of chest pain/pressure, no shortness of breath at rest.  + shortness of breath with exertion.  Denies history of atrial fibrillation or irregular heartbeat  Vascular: No history of rest pain in feet.  No history of claudication.  + history of non-healing ulcer, No history of DVT   Pulmonary: No home oxygen, no productive cough, no hemoptysis,  No asthma or wheezing  Musculoskeletal:  [ ]  Arthritis, [ ]  Low back pain,  [ ]  Joint pain  Hematologic:No history of hypercoagulable state.  No history of easy bleeding.  + history of anemia  Gastrointestinal: No hematochezia or melena,  No gastroesophageal reflux, no trouble swallowing  Urinary: [ ]  chronic Kidney disease, [x]  on HD - [x ] MWF or [ ]  TTHS, [ ]  Burning with urination, [ ]  Frequent urination, [ ]  Difficulty urinating;   Skin: No rashes  Psychological: No history of anxiety,   No history of depression   Physical Examination  Filed Vitals:   03/25/13 0717 03/25/13 1045 03/25/13 1100  BP: 106/54 114/65 122/69  Pulse: 96 91   Temp: 98.2  F (36.8 C)    TempSrc: Oral    Resp: 20    SpO2: 97% 99%     There is no weight on file to calculate BMI.  General:  Alert and oriented, no acute distress HEENT: Normal Neck: No bruit or JVD Pulmonary: Clear to auscultation bilaterally Cardiac: Regular Rate and Rhythm without murmur Abdomen: Soft, non-tender, non-distended, no mass Skin: No rash, 7 cm ulcer with necrotic base left heel soft underlying tissue no erythema no purulence non tender, left below knee incision and upper thigh incisions partially open but overall healing Extremity Pulses:  2+ femoral, absent left dorsalis pedis, posterior tibial pulses, foot warm Musculoskeletal: right bka  Neurologic: Upper and lower extremity motor 5/5 and symmetric  DATA: foot xray osteomyelitis of heel with bone separation   ASSESSMENT: Probably non salvageable foot.  Since Dr Edilia Bo is more familiar with her wound will have him review tomorrow.  If he agrees most likely amputation Tuesday to avoid her dialysis day.  Continue Santyl wound care   PLAN: see above  Fabienne Bruns, MD [facility] [place] Office: [phone] Pager: [phone]

## 2013-03-25 NOTE — H&P (Signed)
[facility] History and Physical  [name] QMV:[id number] DOB: 15-Mar-1956 DOA: 03/25/2013   PCP: No primary provider on file.   Chief Complaint: Generalized weakness and mechanical fall  HPI:  57 year old female with a history of ischemic cardiomyopathy (EF 45-50%), ESRD, diabetes mellitus, peripheral vascular disease presents with one to two-week history of generalized weakness. The patient states that she was trying to transfer to her wheelchair early this morning and fell onto the floor during the transfer. She did not  injure herself. Upon evaluation in ED, the patient was found to have a left heel ulcer that had necrotic material and foul odor. X-ray of the left heel suggested osteomyelitis with a portion of bone avulsion. The patient denied any fevers, chills, chest discomfort, shortness of breath, vomiting, diarrhea, abdominal pain. 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 The patient states that the ulcer on her left calcaneal area started in May 2014 and gradually got worse. She underwent a left femoral-popliteal bypass performed by Dr. Edilia Bo on 02/20/2013. The left calcaneal wound has not significantly improved since that period of time. This past week, the patient was referred to go to hyperbaric oxygen therapy. Patient believes that she may also be receiving some type of IV antibiotics on dialysis on her last 2 dialysis sessions. She does not know the name of the antibiotic. In addition, the patient has been taking cephalexin 250 mg twice a day since discharge from the hospital from her femoral-popliteal bypass. She receives dialysis through a right upper extremity fistula on Monday, Wednesday, Fridays.  In the ED, the patient was hemodynamically stable. Lactic acid was 1.83. WBC 13.9. BMP showed sodium 133 and serum creatinine 4.27. Sedimentation rate was 62. Assessment/Plan: Diabetic foot ulcer with probable acute osteomyelitis of the left  calcaneus -probe to bone test was positive -X-ray of the left foot suggested osteomyelitis -I have consulted Vascular Surgery to see pt  - Patient will likely need debridement and possibly amputation -Blood cultures x2 sets -Empiric vancomycin and Zosyn Peripheral vascular disease with left lower extremity ulcerations -Consult wound care nurse -Status post left femoral popliteal bypass 02/20/2013 -Oxycodone 5 mg every 6 hours when necessary pain Ischemic cardiomyopathy -Continue aspirin and Plavix Diabetes mellitus type 2 -Start patient on half home dose of Lantus -NovoLog sliding scale ESRD -I have contacted nephrology for maintenance dialysis -The patient takes Midrin 10 mg Monday, Wednesday, Fridays before dialysis Hyperlipidemia -Continue Lipitor        Past Medical History  Diagnosis Date  . Diabetes mellitus, type 2   . Secondary hyperparathyroidism   . OSA (obstructive sleep apnea)     NPSG 11.23.11-AHI 64.9/hr-central and obstructive apnea  . Ischemic cardiomyopathy     EF 25-30% 2011  . CAD (coronary artery disease)   . Pulmonary hypertension     Est PAsyst 60-Dr Mendel Ryder, off coumadin, CT neg for PE  . Hyperlipidemia   . Anemia   . Thyroid disease   . Pulmonary edema   . Peripheral vascular disease   . Sebaceous cyst   . 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 Velvet Bathe  . Irregular heart beat   . Shortness of breath     with exertion  . Hemodialysis patient     M,W,F  . TIA (transient ischemic attack) June 2014  . Ulcer of heel due to diabetes     left  . Kidney disease Dialysis MWF    ESRD secondary to DM,  failed transplant 2006. Dialysis   Past Surgical History  Procedure Laterality Date  . Coronary artery bypass graft  2011    3V  . Insertion of dialysis catheter    . 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 Nasal mucosal biopsy    . Ovarian cyst surgery    . Removal of failed cadaveric renal transplant  2006-wound infection    . Laser retinal surgery  2011  . Kidney transplant      rejection  . Abdominal angiogram Left 01/25/13  . Colonoscopy    . Cardiac catheterization    . Eye surgery    . Femoral-popliteal bypass graft Left 02/20/2013    Procedure:  FEMORAL-to below knee POPLITEAL ARTERY bypasswith saphenous vein with intraoperative angiogram ;  Surgeon: Chuck Hint, MD;  Location: [facility] OR;  Service: Vascular;  Laterality: Left;  fem-below knee pop   Social History:  reports that she has never smoked. She has never used smokeless tobacco. She reports that she does not drink alcohol or use illicit drugs.   Family History  Problem Relation Age of Onset  . Heart disease    . Edema Father     fluid overload  . Cerebral aneurysm Mother   . Stroke Brother   . Heart disease Brother      Allergies  Allergen Reactions  . Neurontin (Gabapentin) Anaphylaxis  . Ezetimibe-Simvastatin Other (See Comments)    REACTION: face edema; itching  . Klonopin (Clonazepam) Other (See Comments)    hallucination  . Sertraline Hcl Other (See Comments)    hallucination      Prior to Admission medications   Medication Sig Start Date End Date Taking? Authorizing Provider  acetaminophen (TYLENOL) 325 MG tablet Take 650 mg by mouth every 6 (six) hours as needed for pain.    Yes Historical Provider, MD  atorvastatin (LIPITOR) 10 MG tablet Take 10 mg by mouth daily before breakfast.    Yes Historical Provider, MD  B Complex-C (B-COMPLEX WITH VITAMIN C) tablet Take 1 tablet by mouth daily.   Yes Historical Provider, MD  cinacalcet (SENSIPAR) 60 MG tablet Take 60 mg by mouth daily before breakfast.    Yes Historical Provider, MD  clopidogrel (PLAVIX) 75 MG tablet Take 1 tablet (75 mg total) by mouth daily. 02/13/13  Yes Rhetta Mura, MD  collagenase (SANTYL) ointment Apply 1 application topically every other day.   Yes Historical Provider, MD  ibuprofen (ADVIL,MOTRIN) 600 MG tablet Take 600  mg by mouth every 6 (six) hours as needed for pain.  01/20/13  Yes Historical Provider, MD  insulin aspart (NOVOLOG) 100 UNIT/ML injection Inject 3-16 Units into the skin 3 (three) times daily before meals. Sliding scale   Yes Historical Provider, MD  insulin glargine (LANTUS) 100 UNIT/ML injection Inject 12 Units into the skin at bedtime.    Yes Historical Provider, MD  midodrine (PROAMATINE) 10 MG tablet Take 10 mg by mouth 3 (three) times a week. Take on Monday, Wednesday, and Friday   Yes Historical Provider, MD  oxyCODONE (OXY IR/ROXICODONE) 5 MG immediate release tablet Take 1 tablet (5 mg total) by mouth every 8 (eight) hours as needed for pain. 02/24/13  Yes Samantha J Rhyne, PA-C  Propylene Glycol (SYSTANE BALANCE OP) Place 1 drop into both eyes as needed (dry eyes). Dry eyes   Yes Historical Provider, MD  sevelamer (RENVELA) 800 MG tablet Take 2,400 mg by mouth 3 (three) times daily with meals.  3 tabs by mouth three times a day   Yes Historical Provider, MD  tetrahydrozoline-zinc (VISINE-AC) 0.05-0.25 % ophthalmic solution Place 2 drops into both eyes 3 (three) times daily as needed (for redness).   Yes Historical Provider, MD  cephALEXin (KEFLEX) 250 MG capsule Take 1 capsule (250 mg total) by mouth 2 (two) times daily. 03/14/13   Chuck Hint, MD    Review of Systems:  Constitutional:  No weight loss, night sweats, Fevers, chills, fatigue.  Head&Eyes: No headache.  No vision loss.  No eye pain or scotoma ENT:  No Difficulty swallowing,Tooth/dental problems,Sore throat,  No ear ache, post nasal drip,  Cardio-vascular:  No chest pain, Orthopnea, PND, swelling in lower extremities,  dizziness, palpitations  GI:  No  abdominal pain,vomiting, diarrhea, loss of appetite, hematochezia, melena, heartburn, indigestion, Resp:  No shortness of breath with exertion or at rest. No cough. No coughing up of blood .No wheezing.No chest wall deformity  Skin:  no rash or lesions.  GU:   Patient is anuric. No flank pain.  Musculoskeletal:  No joint pain. No decreased range of motion. No back pain. Positive pain--left lower tremors or wounds Psych:  No change in mood or affect. No depression or anxiety. Neurologic: No headache, no dysesthesia, no focal weakness, no vision loss. No syncope  Physical Exam: Filed Vitals:   03/25/13 0717 03/25/13 1045 03/25/13 1100  BP: 106/54 114/65 122/69  Pulse: 96 91   Temp: 98.2 F (36.8 C)    TempSrc: Oral    Resp: 20    SpO2: 97% 99%    General:  A&O x 3, NAD, nontoxic, pleasant/cooperative Head/Eye: No conjunctival hemorrhage, no icterus, [place]/AT, No nystagmus ENT:  No icterus,  No thrush, no pharyngeal exudate Neck:  No masses, no lymphadenpathy, no bruits CV:  RRR, no rub, no gallop, no S3 Lung:  CTAB, good air movement, no wheeze, no rhonchi Abdomen: soft/NT, +BS, nondistended, no peritoneal signs Ext: Right lower extremity below knee amputation site without erythema or open wounds; left calcaneal area with approximately 6 cm ulceration with purulent drainage and necrotic tissue with foul odor--probe to bone test was positive--no crepitance.   Labs on Admission:  Basic Metabolic Panel:  Recent Labs Lab 03/25/13 0916  NA 133*  K 3.8  CL 91*  CO2 31  GLUCOSE 125*  BUN 23  CREATININE 4.27*  CALCIUM 9.9  MG 2.2  PHOS 2.9   Liver Function Tests: No results found for this basename: AST, ALT, ALKPHOS, BILITOT, PROT, ALBUMIN,  in the last 168 hours No results found for this basename: LIPASE, AMYLASE,  in the last 168 hours No results found for this basename: AMMONIA,  in the last 168 hours CBC:  Recent Labs Lab 03/25/13 0916  WBC 13.9*  NEUTROABS 11.4*  HGB 11.8*  HCT 35.2*  MCV 84.0  PLT 142*   Cardiac Enzymes:  Recent Labs Lab 03/25/13 0917  TROPONINI <0.30   BNP: No components found with this basename: POCBNP,  CBG:  Recent Labs Lab 03/20/13 1358 03/22/13 1300 03/22/13 1455 03/23/13 1250  03/23/13 1443  GLUCAP 147* 256* 175* 150* 155*    Radiological Exams on Admission: Dg Foot 2 Views Left  03/25/2013   *RADIOLOGY REPORT*  Clinical Data: Draining wound of the posterior left heel.  LEFT FOOT - 2 VIEW  Comparison: 02/02/2013  Findings: There is separation of bone with irregular cortical margins at the level of the posterior calcaneus.  Visible soft tissue wound is present  overlying this region and findings are consistent with osteomyelitis and associated avulsion of bone at the insertion of the Achilles tendon.  Diffuse diabetic related calcifications are seen in the arteries of the foot.  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 Soft tissue wound is evident in this region.   Original Report Authenticated By: Irish Lack, M.D.    EKG: Independently reviewed. Sinus, poor R wave progress, 1st degreeAVB    Time spent:70 minutes Code Status:   FULL Family Communication:   Husband and sister at bedside   Evah Rashid, DO  [facility] Pager [phone]  If 7PM-7AM, please contact night-coverage [email or web address] Password [facility] 03/25/2013, 12:26 PM

## 2013-03-25 NOTE — Progress Notes (Signed)
ANTIBIOTIC CONSULT NOTE - INITIAL  Pharmacy Consult for Vancomycin Indication: Heel ulcer, probable osteomyelitis  Allergies  Allergen Reactions  . Neurontin (Gabapentin) Anaphylaxis  . Ezetimibe-Simvastatin Other (See Comments)    REACTION: face edema; itching  . Klonopin (Clonazepam) Other (See Comments)    hallucination  . Sertraline Hcl Other (See Comments)    hallucination    Patient Measurements:  Weight 94.6kg Adjusted Body Weight:   Vital Signs: Temp: 98.1 F (36.7 C) (07/13 1305) Temp src: Oral (07/13 1305) BP: 113/72 mmHg (07/13 1305) Pulse Rate: 90 (07/13 1305) Intake/Output from previous day:   Intake/Output from this shift:    Labs:  Recent Labs  03/25/13 0916  WBC 13.9*  HGB 11.8*  PLT 142*  CREATININE 4.27*   The CrCl is unknown because both a height and weight (above a minimum accepted value) are required for this calculation.  Recent Labs  03/25/13 1500  VANCORANDOM 15.7     Microbiology: No results found for this or any previous visit (from the past 720 hour(s)).  Medical History: Past Medical History  Diagnosis Date  . Diabetes mellitus, type 2   . Secondary hyperparathyroidism   . OSA (obstructive sleep apnea)     NPSG 11.23.11-AHI 64.9/hr-central and obstructive apnea  . Ischemic cardiomyopathy     EF 25-30% 2011  . CAD (coronary artery disease)   . Pulmonary hypertension     Est PAsyst 60-Dr Mendel Ryder, off coumadin, CT neg for PE  . Hyperlipidemia   . Anemia   . Thyroid disease   . Pulmonary edema   . Peripheral vascular disease   . Sebaceous cyst   . 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 Velvet Bathe  . Irregular heart beat   . Shortness of breath     with exertion  . Hemodialysis patient     M,W,F  . TIA (transient ischemic attack) June 2014  . Ulcer of heel due to diabetes     left  . Kidney disease Dialysis MWF    ESRD secondary to DM, failed transplant 2006. Dialysis    Medications:  Prescriptions prior to  admission  Medication Sig Dispense Refill  . acetaminophen (TYLENOL) 325 MG tablet Take 650 mg by mouth every 6 (six) hours as needed for pain.       [name] atorvastatin (LIPITOR) 10 MG tablet Take 10 mg by mouth daily before breakfast.       . B Complex-C (B-COMPLEX WITH VITAMIN C) tablet Take 1 tablet by mouth daily.      . cinacalcet (SENSIPAR) 60 MG tablet Take 60 mg by mouth daily before breakfast.       . clopidogrel (PLAVIX) 75 MG tablet Take 1 tablet (75 mg total) by mouth daily.  30 tablet  0  . collagenase (SANTYL) ointment Apply 1 application topically every other day.      . ibuprofen (ADVIL,MOTRIN) 600 MG tablet Take 600 mg by mouth every 6 (six) hours as needed for pain.       [name] insulin aspart (NOVOLOG) 100 UNIT/ML injection Inject 3-16 Units into the skin 3 (three) times daily before meals. Sliding scale      . insulin glargine (LANTUS) 100 UNIT/ML injection Inject 12 Units into the skin at bedtime.       . midodrine (PROAMATINE) 10 MG tablet Take 10 mg by mouth 3 (three) times a week. Take on Monday, Wednesday, and Friday      . oxyCODONE (OXY IR/ROXICODONE) 5 MG immediate release  tablet Take 1 tablet (5 mg total) by mouth every 8 (eight) hours as needed for pain.  30 tablet  0  . Propylene Glycol (SYSTANE BALANCE OP) Place 1 drop into both eyes as needed (dry eyes). Dry eyes      . sevelamer (RENVELA) 800 MG tablet Take 2,400 mg by mouth 3 (three) times daily with meals. 3 tabs by mouth three times a day      . tetrahydrozoline-zinc (VISINE-AC) 0.05-0.25 % ophthalmic solution Place 2 drops into both eyes 3 (three) times daily as needed (for redness).      . cephALEXin (KEFLEX) 250 MG capsule Take 1 capsule (250 mg total) by mouth 2 (two) times daily.  28 capsule  0   Assessment: 57yof with ESRD on HD (MWF) to receive antibiotics for left heel ulcer and probable osteomyelitis per Xray. Random vancomycin level was checked due to patient being unsure if she had been receiving Vancomycin  during her recent HD sessions. Vancomycin level was therapeutic at 15.7 mcg/ml but is at the lower end of the range - will give an additional 500mg  since osteomyelitis is suspected. Patient has also been placed on Zosyn 2.25g IV q8h - dosing appropriate for ESRD on HD. - Afebrile - WBC 13.9  Goal of Therapy:  Vancomycin pre-HD level: 15-25 mcg/ml  Plan:  1. Vancomycin 500mg  IV x 1 today, then Vancomycin 1g IV qHD (MWF) 2. Continue Zosyn 2.25g IV q8h 3. Follow-up clinical course, cultures, HD schedule and procedural plans  Cleon Dew [phone] 03/25/2013,4:45 PM

## 2013-03-25 NOTE — ED Notes (Addendum)
Received pt from home with c/o while trying to transfer from her bed to wheelchair she fell. Pt called EMS due to unable to get up. Pt has no complaints from fall. In June pt had surgery for artery and vein transplant Left leg, pt reports pain in left calf but that is not new. Dialysis pt, days are MWF.

## 2013-03-25 NOTE — ED Notes (Signed)
Patient transported to X-ray 

## 2013-03-25 NOTE — ED Notes (Signed)
Asked pt about providing urine sample and pt stated she would not be able to

## 2013-03-25 NOTE — ED Provider Notes (Addendum)
History    CSN: [id number] Arrival date & time 03/25/13  4098  First MD Initiated Contact with Patient 03/25/13 [phone]     Chief Complaint  Patient presents with  . Fall   (Consider location/radiation/quality/duration/timing/severity/associated sxs/prior Treatment) HPI Comments: Pt with history of end-stage renal disease on dialysis, diabetes, Ischemic cardiomyopathy with an ejection fraction of 25-30%, PVD. 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 Pt states that for the past few dyasd she has been feeling fatigued. She had a fall today, and was unableto get herself up because of her weakness. She has home nurse and wound care follow up for her chronic wounds (pressure ulcer in the heel, and the surgical incision wound to the thigh). The wound healing has been "up and down" but she admtis to increased drainage from the heel wound with some odor to it. There is no increased pain. Pt denies nausea, emesis, fevers, chills, chest pains, shortness of breath, headaches, abdominal pain, uti like symptoms.    The history is provided by the patient and medical records.   Past Medical History  Diagnosis Date  . Diabetes mellitus, type 2   . Secondary hyperparathyroidism   . OSA (obstructive sleep apnea)     NPSG 11.23.11-AHI 64.9/hr-central and obstructive apnea  . Ischemic cardiomyopathy     EF 25-30% 2011  . CAD (coronary artery disease)   . Pulmonary hypertension     Est PAsyst 60-Dr Mendel Ryder, off coumadin, CT neg for PE  . Hyperlipidemia   . Anemia   . Thyroid disease   . Pulmonary edema   . Peripheral vascular disease   . Sebaceous cyst   . 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 Velvet Bathe  . Irregular heart beat   . Shortness of breath     with exertion  . Hemodialysis patient     M,W,F  . TIA (transient ischemic attack) June 2014  . Ulcer of heel due to diabetes     left  . Kidney disease Dialysis MWF   ESRD secondary to DM, failed transplant 2006. Dialysis   Past Surgical History  Procedure Laterality Date  . Coronary artery bypass graft  2011    3V  . Insertion of dialysis catheter    . 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 Nasal mucosal biopsy    . Ovarian cyst surgery    . 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 Laser retinal surgery  2011  . Kidney transplant      rejection  . Abdominal angiogram Left 01/25/13  . Colonoscopy    . Cardiac catheterization    . Eye surgery    . Femoral-popliteal bypass graft Left 02/20/2013    Procedure:  FEMORAL-to below knee POPLITEAL ARTERY bypasswith saphenous vein with intraoperative angiogram ;  Surgeon: Chuck Hint, MD;  Location: [facility] OR;  Service: Vascular;  Laterality: Left;  fem-below knee pop   Family History  Problem Relation Age of Onset  . Heart disease    . Edema Father     fluid overload  . Cerebral aneurysm Mother   . Stroke Brother   . Heart disease Brother    History  Substance Use Topics  . Smoking status: Never Smoker   . Smokeless tobacco: Never Used  . Alcohol Use: No   OB History   Grav Para Term Preterm  Abortions TAB SAB Ect Mult Living                 Review of Systems  Constitutional: Positive for fever. Negative for activity change.  HENT: Negative for facial swelling and neck pain.   Respiratory: Negative for cough, shortness of breath and wheezing.   Cardiovascular: Negative for chest pain.  Gastrointestinal: Negative for nausea, vomiting, abdominal pain, diarrhea, constipation, blood in stool and abdominal distention.  Genitourinary: Negative for hematuria and difficulty urinating.  Skin: Positive for wound. Negative for color change.  Neurological: Positive for weakness. Negative for speech difficulty.  Hematological: Does not bruise/bleed easily.  Psychiatric/Behavioral: Negative for confusion.    Allergies  Neurontin;  Ezetimibe-simvastatin; Klonopin; and Sertraline hcl  Home Medications   Current Outpatient Rx  Name  Route  Sig  Dispense  Refill  . acetaminophen (TYLENOL) 325 MG tablet   Oral   Take 650 mg by mouth every 6 (six) hours as needed for pain.          [name] atorvastatin (LIPITOR) 10 MG tablet   Oral   Take 10 mg by mouth daily before breakfast.          . B Complex-C (B-COMPLEX WITH VITAMIN C) tablet   Oral   Take 1 tablet by mouth daily.         . cinacalcet (SENSIPAR) 60 MG tablet   Oral   Take 60 mg by mouth daily before breakfast.          . clopidogrel (PLAVIX) 75 MG tablet   Oral   Take 1 tablet (75 mg total) by mouth daily.   30 tablet   0   . collagenase (SANTYL) ointment   Topical   Apply 1 application topically every other day.         . ibuprofen (ADVIL,MOTRIN) 600 MG tablet   Oral   Take 600 mg by mouth every 6 (six) hours as needed for pain.          [name] insulin aspart (NOVOLOG) 100 UNIT/ML injection   Subcutaneous   Inject 3-16 Units into the skin 3 (three) times daily before meals. Sliding scale         . insulin glargine (LANTUS) 100 UNIT/ML injection   Subcutaneous   Inject 12 Units into the skin at bedtime.          . midodrine (PROAMATINE) 10 MG tablet   Oral   Take 10 mg by mouth 3 (three) times a week. Take on Monday, Wednesday, and Friday         . oxyCODONE (OXY IR/ROXICODONE) 5 MG immediate release tablet   Oral   Take 1 tablet (5 mg total) by mouth every 8 (eight) hours as needed for pain.   30 tablet   0   . Propylene Glycol (SYSTANE BALANCE OP)   Both Eyes   Place 1 drop into both eyes as needed (dry eyes). Dry eyes         . sevelamer (RENVELA) 800 MG tablet   Oral   Take 2,400 mg by mouth 3 (three) times daily with meals. 3 tabs by mouth three times a day         . tetrahydrozoline-zinc (VISINE-AC) 0.05-0.25 % ophthalmic solution   Both Eyes   Place 2 drops into both eyes 3 (three) times daily as needed (for  redness).         . cephALEXin (KEFLEX) 250 MG capsule   Oral  Take 1 capsule (250 mg total) by mouth 2 (two) times daily.   28 capsule   0    BP 106/54  Pulse 96  Temp(Src) 98.2 F (36.8 C) (Oral)  Resp 20  SpO2 97% Physical Exam  Nursing note and vitals reviewed. Constitutional: She is oriented to person, place, and time. She appears well-developed and well-nourished.  HENT:  Head: Normocephalic and atraumatic.  Eyes: EOM are normal. Pupils are equal, round, and reactive to light.  Neck: Neck supple. No JVD present.  Cardiovascular: Normal rate and regular rhythm.   Murmur heard. Pulmonary/Chest: Effort normal. No respiratory distress.  Abdominal: Soft. She exhibits no distension. There is no tenderness. There is no rebound and no guarding.  Musculoskeletal:  Right sided BKA  Neurological: She is alert and oriented to person, place, and time.  Skin: Skin is warm and dry.  Pt has a 12 cm gaping incision wound with granulation tissue to the left calf region. Pt has a 9 cm diameter ulcer to the left heel, with some necrotic tissue and foul odorous discharge. No frank purulence.    ED Course  Procedures (including critical care time) Labs Reviewed  CBC WITH DIFFERENTIAL - Abnormal; Notable for the following:    WBC 13.9 (*)    Hemoglobin 11.8 (*)    HCT 35.2 (*)    RDW 15.6 (*)    Platelets 142 (*)    Neutrophils Relative % 82 (*)    Neutro Abs 11.4 (*)    Lymphocytes Relative 8 (*)    Monocytes Absolute 1.1 (*)    All other components within normal limits  URINE CULTURE  CULTURE, BLOOD (ROUTINE X 2)  CULTURE, BLOOD (ROUTINE X 2)  BASIC METABOLIC PANEL  TROPONIN I  URINALYSIS, ROUTINE W REFLEX MICROSCOPIC  MAGNESIUM  PHOSPHORUS  SEDIMENTATION RATE  C-REACTIVE PROTEIN  CG4 I-STAT (LACTIC ACID)   Dg Foot 2 Views Left  03/25/2013   *RADIOLOGY REPORT*  Clinical Data: Draining wound of the posterior left heel.  LEFT FOOT - 2 VIEW  Comparison: 02/02/2013   Findings: There is separation of bone with irregular cortical margins at the level of the posterior calcaneus.  Visible soft tissue wound is present overlying this region and findings are consistent with osteomyelitis and associated avulsion of bone at the insertion of the Achilles tendon.  Diffuse diabetic related calcifications are seen in the arteries of the foot.  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 Soft tissue wound is evident in this region.   Original Report Authenticated By: Irish Lack, M.D.   No diagnosis found.  MDM  DDx: Sepsis syndrome ACS syndrome DKA Infection - pneumonia/UTI/Cellulitis/Osteomyelitis PE Dehydration Electrolyte abnormality Tox syndrome  Pt comes in with cc of weakness. Has vascular dz diffusely. Will get ACSscreen. She is dialysis patient, will check lytes, will get blood cultures to ensure there is no bacteremia. She has non healing chronic wounds, and the one in the heel is concerning for osteo. Will get Xray and MRI possibly to look for any evidence of osteo.   10:10 AM Xray shows osteo evidence in the left heel. Will admit.    Derwood Kaplan, MD 03/25/13 1010  11:20 AM Paged ortho x 2 times. No antibiotics started. Dr. Arbutus Leas to admit and assume care and f/u with ortho.  Derwood Kaplan, MD 03/25/13 1124

## 2013-03-26 DIAGNOSIS — I10 Essential (primary) hypertension: Secondary | ICD-10-CM

## 2013-03-26 DIAGNOSIS — I798 Other disorders of arteries, arterioles and capillaries in diseases classified elsewhere: Secondary | ICD-10-CM

## 2013-03-26 DIAGNOSIS — E1351 Other specified diabetes mellitus with diabetic peripheral angiopathy without gangrene: Secondary | ICD-10-CM

## 2013-03-26 LAB — COMPREHENSIVE METABOLIC PANEL
ALT: 15 U/L (ref 0–35)
BUN: 27 mg/dL — ABNORMAL HIGH (ref 6–23)
CO2: 31 mEq/L (ref 19–32)
Calcium: 9.7 mg/dL (ref 8.4–10.5)
GFR calc Af Amer: 10 mL/min — ABNORMAL LOW (ref >90)
GFR calc non Af Amer: 8 mL/min — ABNORMAL LOW (ref >90)
Glucose, Bld: 86 mg/dL (ref 70–99)
Total Protein: 7.1 g/dL (ref 6.0–8.3)

## 2013-03-26 LAB — GLUCOSE, CAPILLARY
Glucose-Capillary: 85 mg/dL (ref 70–99)
Glucose-Capillary: 73 mg/dL (ref 70–99)
Glucose-Capillary: 58 mg/dL — ABNORMAL LOW (ref 70–99)

## 2013-03-26 MED ORDER — RENA-VITE PO TABS
1.0000 | ORAL_TABLET | Freq: Every day | ORAL | Status: DC
  Administered 2013-03-27 – 2013-03-29 (×3): 1 via ORAL
  Filled 2013-03-26 (×4): qty 1

## 2013-03-26 MED ORDER — HEPARIN SODIUM (PORCINE) 1000 UNIT/ML IJ SOLN
2800.0000 [IU] | Freq: Once | INTRAMUSCULAR | Status: AC
  Administered 2013-03-26: 2800 [IU] via INTRAVENOUS

## 2013-03-26 MED ORDER — RENA-VITE PO TABS: 1.0000 | ORAL_TABLET | Freq: Every day | ORAL | Status: DC

## 2013-03-26 NOTE — Consult Note (Signed)
WOC consult requested prior to [facility]VS involvement.  Pt has osteomyelitis; this is beyond WOC scope of  Practice.  Please refer to VVS or ortho team for further plan of care. Please re-consult if further assistance is needed.  Thank-you,  Cammie Mcgee MSN, RN, CWOCN, [place], CNS [phone]

## 2013-03-26 NOTE — Progress Notes (Addendum)
[facility] PROGRESS NOTE  PEARSON REASONS ZOX:[id number] DOB: 05/23/1956 DOA: 03/25/2013 PCP: No primary provider on file.  Assessment/Plan: Diabetic foot ulcer with probable acute osteomyelitis of the left calcaneus  -probe to bone test was positive  -X-ray of the left foot suggested osteomyelitis  -continue Empiric vancomycin and Zosyn follow blood cultures -Vascular Surgery recommends AKA- planned for 7/15  Peripheral vascular disease with left lower extremity ulcerations   -Status post left femoral popliteal bypass 02/20/2013  -continue Oxycodone 5 mg every 6 hours when necessary pain  Ischemic cardiomyopathy  -Continue aspirin and Plavix  Diabetes mellitus type 2  -continue decreased dose Lantus for now, follow and increase post op when tolerating po well as appropriate, continueNovoLog sliding scale  ESRD  -dialysis per renal -The patient takes Midrin 10 mg Monday, Wednesday, Fridays before dialysis  Hyperlipidemia  -Continue Lipitor    Code Status: full Family Communication: sister at bedisde Disposition Plan: pending clinical course   Consultants:  Vascular- Dr Darrick Penna  Procedures:  L. AKA planned  Antibiotics:    HPI/Subjective: Pt denies any c/o at this time- states she did not know that her infection was that bad, until today, family at bedside  Objective: Filed Vitals:   03/25/13 1900 03/25/13 2201 03/26/13 0610 03/26/13 1013  BP:  110/59 114/62 121/63  Pulse:  87 92 93  Temp:  98.1 F (36.7 C) 98.5 F (36.9 C) 98.6 F (37 C)  TempSrc:  Oral Oral Oral  Resp:  18 18 16   Height: 5\' 9"  (1.753 m)     Weight:  92.2 kg (203 lb 4.2 oz)    SpO2:  99% 99% 99%    Intake/Output Summary (Last 24 hours) at 03/26/13 1344 Last data filed at 03/26/13 1014  Gross per 24 hour  Intake    480 ml  Output      0 ml  Net    480 ml   Filed Weights   03/25/13 2201  Weight: 92.2 kg (203 lb 4.2 oz)   Exam:  General: alert & oriented x 3 In  NAD Cardiovascular: RRR, nl S1 s2 Respiratory: CTAB Abdomen: soft +BS NT/ND, no masses palpable Extremities: R. BKA, LLE with dressing clean and dry    Data Reviewed: Basic Metabolic Panel:  Recent Labs Lab 03/25/13 0916 03/26/13 0430  NA 133* 134*  K 3.8 4.6  CL 91* 93*  CO2 31 31  GLUCOSE 125* 86  BUN 23 27*  CREATININE 4.27* 5.22*  CALCIUM 9.9 9.7  MG 2.2  --   PHOS 2.9  --    Liver Function Tests:  Recent Labs Lab 03/26/13 0430  AST 18  ALT 15  ALKPHOS 108  BILITOT 0.6  PROT 7.1  ALBUMIN 1.9*   No results found for this basename: LIPASE, AMYLASE,  in the last 168 hours No results found for this basename: AMMONIA,  in the last 168 hours CBC:  Recent Labs Lab 03/25/13 0916  WBC 13.9*  NEUTROABS 11.4*  HGB 11.8*  HCT 35.2*  MCV 84.0  PLT 142*   Cardiac Enzymes:  Recent Labs Lab 03/25/13 0917  TROPONINI <0.30   BNP (last 3 results) No results found for this basename: PROBNP,  in the last 8760 hours CBG:  Recent Labs Lab 03/23/13 1443 03/25/13 1756 03/25/13 2205 03/26/13 0751 03/26/13 1127  GLUCAP 155* 102* 92 85 73    Recent Results (from the past 240 hour(s))  CULTURE, BLOOD (ROUTINE X 2)     Status:  None   Collection Time    03/25/13  9:17 AM      Result Value Range Status   Specimen Description BLOOD LEFT ARM   Final   Special Requests BOTTLES DRAWN AEROBIC ONLY 10CC   Final   Culture  Setup Time 03/25/2013 20:46   Final   Culture     Final   Value:        BLOOD CULTURE RECEIVED NO GROWTH TO DATE CULTURE WILL BE HELD FOR 5 DAYS BEFORE ISSUING A FINAL NEGATIVE REPORT   Report Status PENDING   Incomplete  CULTURE, BLOOD (ROUTINE X 2)     Status: None   Collection Time    03/25/13  9:22 AM      Result Value Range Status   Specimen Description BLOOD LEFT HAND   Final   Special Requests BOTTLES DRAWN AEROBIC ONLY 3CC   Final   Culture  Setup Time 03/25/2013 20:46   Final   Culture     Final   Value:        BLOOD CULTURE  RECEIVED NO GROWTH TO DATE CULTURE WILL BE HELD FOR 5 DAYS BEFORE ISSUING A FINAL NEGATIVE REPORT   Report Status PENDING   Incomplete     Studies: Dg Foot 2 Views Left  03/25/2013   *RADIOLOGY REPORT*  Clinical Data: Draining wound of the posterior left heel.  LEFT FOOT - 2 VIEW  Comparison: 02/02/2013  Findings: There is separation of bone with irregular cortical margins at the level of the posterior calcaneus.  Visible soft tissue wound is present overlying this region and findings are consistent with osteomyelitis and associated avulsion of bone at the insertion of the Achilles tendon.  Diffuse diabetic related calcifications are seen in the arteries of the foot.  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 Soft tissue wound is evident in this region.   Original Report Authenticated By: Irish Lack, M.D.    Scheduled Meds: . antiseptic oral rinse  15 mL Mouth Rinse BID  . atorvastatin  10 mg Oral QAC breakfast  . B-complex with vitamin C  1 tablet Oral Daily  . cinacalcet  60 mg Oral Q breakfast  . collagenase  1 application Topical QODAY  . heparin  5,000 Units Subcutaneous Q8H  . insulin aspart  0-9 Units Subcutaneous TID WC  . insulin glargine  6 Units Subcutaneous QHS  . midodrine  10 mg Oral 3 times weekly  . piperacillin-tazobactam (ZOSYN)  IV  2.25 g Intravenous Q8H  . sevelamer carbonate  2,400 mg Oral TID WC  . sodium chloride  3 mL Intravenous Q12H  . vancomycin  1,000 mg Intravenous Q M,W,F-HD   Continuous Infusions:   Active Problems:   HYPERLIPIDEMIA   End stage renal disease   Atherosclerosis of native arteries of the extremities with ulceration(440.23)   Cardiomyopathy, ischemic   Atherosclerotic PVD with ulceration   Acute osteomyelitis of foot    Time spent: 25    [facility]  [facility] Pager [phone]. If 7PM-7AM, please contact  night-coverage at [email or web address], password [facility] 03/26/2013, 1:44 PM  LOS: 1 day

## 2013-03-26 NOTE — Progress Notes (Signed)
VASCULAR PROGRESS NOTE  SUBJECTIVE: No complaints  PHYSICAL EXAM: Filed Vitals:   03/25/13 1817 03/25/13 1900 03/25/13 2201 03/26/13 0610  BP: 108/69  110/59 114/62  Pulse: 90  87 92  Temp: 98.4 F (36.9 C)  98.1 F (36.7 C) 98.5 F (36.9 C)  TempSrc: Oral  Oral Oral  Resp: 20  18 18   Height:  5\' 9"  (1.753 m)    Weight:   203 lb 4.2 oz (92.2 kg)   SpO2: 97%  99% 99%   Left groin wound with reasonable granulation tissue. Thigh wound same. Below the knee incision with moderate drainage. Left heel with moderate drainage.  LABS: Lab Results  Component Value Date   WBC 13.9* 03/25/2013   HGB 11.8* 03/25/2013   HCT 35.2* 03/25/2013   MCV 84.0 03/25/2013   PLT 142* 03/25/2013   Lab Results  Component Value Date   CREATININE 5.22* 03/26/2013   Lab Results  Component Value Date   INR 1.29 02/19/2013   CBG (last 3)   Recent Labs  03/23/13 1443 03/25/13 1756 03/25/13 2205  GLUCAP 155* 102* 92    Active Problems:   HYPERLIPIDEMIA   End stage renal disease   Atherosclerosis of native arteries of the extremities with ulceration(440.23)   Cardiomyopathy, ischemic   Atherosclerotic PVD with ulceration   Acute osteomyelitis of foot  X-ray Left foot: Evidence of osteomyelitis of the posterior calcaneus with overt  separation of bone at the insertion site of the Achilles tendon. Extensive osteo of heel.  ASSESSMENT AND PLAN:  * This patient has extensive osteomyelitis of the left heel. This would require extensive debridement of the bone and would not leave a foot was salvageable. She has a right BKA which took months to heal many years ago. She has a nonhealing wound below the knee on the left and I think she would be at very high risk for not healing a left alone the knee amputation. For this reason, I have recommended Left AKA. She has hemodialysis today and I will schedule her surgery for tomorrow. I have discussed the situation with her in detail and she is agreeable to  proceed.  * The patient is on IV Zosyn and IV Vanco. Blood cultures are pending. I do not see any cultures of the left heel wound.  * DVT prophylaxis: The patient is on subcutaneous heparin 3 times a day.   Cari Caraway Beeper: [phone] 03/26/2013

## 2013-03-26 NOTE — Progress Notes (Signed)
Utilization review completed.  

## 2013-03-26 NOTE — Consult Note (Signed)
[facility] Renal Consultation Note    Indication for Consultation:  Management of ESRD/hemodialysis; anemia, hypertension/volume and secondary hyperparathyroidism  HPI: [name] is a very pleasant 57 y.o. female ESRD patient (MWF HD) with past medical history significant for DM Type II, ischemic cardiomyopathy (EF 25-30% in  2011 per by echo), three-vessel CABG, hyperlipidemia, and atherosclerotic PVD with right BKA which took months to heal many years ago per notes.  She is status post left fem-pop bypass in June of 2014 and has been admitted for L AKA  secondary to extensive osteomyelitis of left heel and non-healing/draining wounds to her left lower extremity. Surgery is planned for tomorrow. She denies pain, fever, chills, nausea, vomiting, diarrhea or other emerging complaints.  Her outpatient dialysis treatments have been going well.   Past Medical History  Diagnosis Date  . Diabetes mellitus, type 2   . Secondary hyperparathyroidism   . OSA (obstructive sleep apnea)     NPSG 11.23.11-AHI 64.9/hr-central and obstructive apnea  . Ischemic cardiomyopathy     EF 25-30% 2011  . CAD (coronary artery disease)   . Pulmonary hypertension     Est PAsyst 60-Dr Mendel Ryder, off coumadin, CT neg for PE  . Hyperlipidemia   . Anemia   . Thyroid disease   . Pulmonary edema   . Peripheral vascular disease   . Sebaceous cyst   . 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 Velvet Bathe  . Irregular heart beat   . Shortness of breath     with exertion  . Hemodialysis patient     M,W,F  . TIA (transient ischemic attack) June 2014  . Ulcer of heel due to diabetes     left  . Kidney disease Dialysis MWF    ESRD secondary to DM, failed transplant 2006. Dialysis   Past Surgical History  Procedure Laterality Date  . Coronary artery bypass graft  2011    3V  . Insertion of dialysis catheter    . 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 Nasal mucosal  biopsy    . Ovarian cyst surgery    . 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 Laser retinal surgery  2011  . Kidney transplant      rejection  . Abdominal angiogram Left 01/25/13  . Colonoscopy    . Cardiac catheterization    . Eye surgery    . Femoral-popliteal bypass graft Left 02/20/2013    Procedure:  FEMORAL-to below knee POPLITEAL ARTERY bypasswith saphenous vein with intraoperative angiogram ;  Surgeon: Chuck Hint, MD;  Location: [facility] OR;  Service: Vascular;  Laterality: Left;  fem-below knee pop   Family History  Problem Relation Age of Onset  . Heart disease    . Edema Father     fluid overload  . Cerebral aneurysm Mother   . Stroke Brother   . Heart disease Brother    Social History:  reports that she has never smoked. She has never used smokeless tobacco. She reports that she does not drink alcohol or use illicit drugs. Allergies  Allergen Reactions  . Neurontin (Gabapentin) Anaphylaxis  . Ezetimibe-Simvastatin Other (See Comments)    REACTION: face edema; itching  . Klonopin (Clonazepam) Other (See Comments)    hallucination  . Sertraline Hcl Other (See Comments)    hallucination   Prior to Admission medications   Medication Sig Start Date End Date Taking? Authorizing Provider  acetaminophen (  TYLENOL) 325 MG tablet Take 650 mg by mouth every 6 (six) hours as needed for pain.    Yes Historical Provider, MD  atorvastatin (LIPITOR) 10 MG tablet Take 10 mg by mouth daily before breakfast.    Yes Historical Provider, MD  B Complex-C (B-COMPLEX WITH VITAMIN C) tablet Take 1 tablet by mouth daily.   Yes Historical Provider, MD  cinacalcet (SENSIPAR) 60 MG tablet Take 60 mg by mouth daily before breakfast.    Yes Historical Provider, MD  clopidogrel (PLAVIX) 75 MG tablet Take 1 tablet (75 mg total) by mouth daily. 02/13/13  Yes Rhetta Mura, MD  collagenase (SANTYL) ointment Apply 1 application topically every other day.   Yes  Historical Provider, MD  ibuprofen (ADVIL,MOTRIN) 600 MG tablet Take 600 mg by mouth every 6 (six) hours as needed for pain.  01/20/13  Yes Historical Provider, MD  insulin aspart (NOVOLOG) 100 UNIT/ML injection Inject 3-16 Units into the skin 3 (three) times daily before meals. Sliding scale   Yes Historical Provider, MD  insulin glargine (LANTUS) 100 UNIT/ML injection Inject 12 Units into the skin at bedtime.    Yes Historical Provider, MD  midodrine (PROAMATINE) 10 MG tablet Take 10 mg by mouth 3 (three) times a week. Take on Monday, Wednesday, and Friday   Yes Historical Provider, MD  oxyCODONE (OXY IR/ROXICODONE) 5 MG immediate release tablet Take 1 tablet (5 mg total) by mouth every 8 (eight) hours as needed for pain. 02/24/13  Yes Samantha J Rhyne, PA-C  Propylene Glycol (SYSTANE BALANCE OP) Place 1 drop into both eyes as needed (dry eyes). Dry eyes   Yes Historical Provider, MD  sevelamer (RENVELA) 800 MG tablet Take 2,400 mg by mouth 3 (three) times daily with meals. 3 tabs by mouth three times a day   Yes Historical Provider, MD  tetrahydrozoline-zinc (VISINE-AC) 0.05-0.25 % ophthalmic solution Place 2 drops into both eyes 3 (three) times daily as needed (for redness).   Yes Historical Provider, MD  cephALEXin (KEFLEX) 250 MG capsule Take 1 capsule (250 mg total) by mouth 2 (two) times daily. 03/14/13   Chuck Hint, MD   Current Facility-Administered Medications  Medication Dose Route Frequency Provider Last Rate Last Dose  . acetaminophen (TYLENOL) tablet 650 mg  650 mg Oral Q6H PRN Catarina Hartshorn, MD      . antiseptic oral rinse (BIOTENE) solution 15 mL  15 mL Mouth Rinse BID Catarina Hartshorn, MD   15 mL at 03/26/13 0834  . atorvastatin (LIPITOR) tablet 10 mg  10 mg Oral QAC breakfast Catarina Hartshorn, MD   10 mg at 03/26/13 1610  . cinacalcet (SENSIPAR) tablet 60 mg  60 mg Oral Q breakfast Catarina Hartshorn, MD   60 mg at 03/26/13 9604  . collagenase (SANTYL) ointment 1 application  1 application Topical  QODAY Catarina Hartshorn, MD   1 application at 03/26/13 1130  . heparin injection 5,000 Units  5,000 Units Subcutaneous Q8H Catarina Hartshorn, MD   5,000 Units at 03/26/13 1401  . insulin aspart (novoLOG) injection 0-9 Units  0-9 Units Subcutaneous TID WC Catarina Hartshorn, MD      . insulin glargine (LANTUS) injection 6 Units  6 Units Subcutaneous QHS David Tat, MD      . midodrine (PROAMATINE) tablet 10 mg  10 mg Oral 3 times weekly Catarina Hartshorn, MD   10 mg at 03/26/13 5409  . [START ON 03/27/2013] multivitamin (RENA-VIT) tablet 1 tablet  1 tablet Oral Q supper Adeline C  Viyuoh, MD      . naphazoline (NAPHCON) 0.1 % ophthalmic solution 1 drop  1 drop Both Eyes QID PRN Catarina Hartshorn, MD      . ondansetron [facility]) tablet 4 mg  4 mg Oral Q6H PRN Catarina Hartshorn, MD       Or  . ondansetron [facility]) injection 4 mg  4 mg Intravenous Q6H PRN Catarina Hartshorn, MD      . oxyCODONE (Oxy IR/ROXICODONE) immediate release tablet 5 mg  5 mg Oral Q6H PRN Catarina Hartshorn, MD   5 mg at 03/26/13 1610  . piperacillin-tazobactam (ZOSYN) IVPB 2.25 g  2.25 g Intravenous Q8H Catarina Hartshorn, MD   2.25 g at 03/26/13 0531  . sodium chloride 0.9 % injection 3 mL  3 mL Intravenous Q12H Catarina Hartshorn, MD   3 mL at 03/26/13 1129  . vancomycin (VANCOCIN) IVPB 1000 mg/200 mL premix  1,000 mg Intravenous Q M,W,F-HD Dannielle Karvonen [place]       Labs: Basic Metabolic Panel:  Recent Labs Lab 03/25/13 0916 03/26/13 0430  NA 133* 134*  K 3.8 4.6  CL 91* 93*  CO2 31 31  GLUCOSE 125* 86  BUN 23 27*  CREATININE 4.27* 5.22*  CALCIUM 9.9 9.7  PHOS 2.9  --    Liver Function Tests:  Recent Labs Lab 03/26/13 0430  AST 18  ALT 15  ALKPHOS 108  BILITOT 0.6  PROT 7.1  ALBUMIN 1.9*   No results found for this basename: LIPASE, AMYLASE,  in the last 168 hours No results found for this basename: AMMONIA,  in the last 168 hours CBC:  Recent Labs Lab 03/25/13 0916  WBC 13.9*  NEUTROABS 11.4*  HGB 11.8*  HCT 35.2*  MCV 84.0  PLT 142*   Cardiac Enzymes:  Recent  Labs Lab 03/25/13 0917  TROPONINI <0.30   CBG:  Recent Labs Lab 03/23/13 1443 03/25/13 1756 03/25/13 2205 03/26/13 0751 03/26/13 1127  GLUCAP 155* 102* 92 85 73   Studies/Results: Dg Foot 2 Views Left  03/25/2013   *RADIOLOGY REPORT*  Clinical Data: Draining wound of the posterior left heel.  LEFT FOOT - 2 VIEW  Comparison: 02/02/2013  Findings: There is separation of bone with irregular cortical margins at the level of the posterior calcaneus.  Visible soft tissue wound is present overlying this region and findings are consistent with osteomyelitis and associated avulsion of bone at the insertion of the Achilles tendon.  Diffuse diabetic related calcifications are seen in the arteries of the foot.  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 Soft tissue wound is evident in this region.   Original Report Authenticated By: Irish Lack, M.D.    ROS:  + Difficulty sleeping. All other systems negative except as discussed above.   Physical Exam: Filed Vitals:   03/25/13 1900 03/25/13 2201 03/26/13 0610 03/26/13 1013  BP:  110/59 114/62 121/63  Pulse:  87 92 93  Temp:  98.1 F (36.7 C) 98.5 F (36.9 C) 98.6 F (37 C)  TempSrc:  Oral Oral Oral  Resp:  18 18 16   Height: 5\' 9"  (1.753 m)     Weight:  92.2 kg (203 lb 4.2 oz)    SpO2:  99% 99% 99%     General: Well developed, well nourished, in no acute distress. Head: Normocephalic, atraumatic, sclera non-icteric, mucus membranes are moist Neck: Supple. JVD not elevated. Lungs: Clear bilaterally to  auscultation without wheezes, rales, or rhonchi. Breathing is unlabored. Heart: RRR with S1 S2. No murmurs, rubs, or gallops appreciated. Abdomen: Obese, non-tender, non-distended with normoactive bowel sounds. No rebound/guarding. No obvious abdominal masses. M-S:  Strength and tone appear normal for age. Lower extremities: Rt BKA, LLE  with non-healing wounds, wrapped/not examined. +LLE edema Neuro: Alert and oriented X 3. Moves all extremities spontaneously. Psych:  Responds to questions appropriately with a normal affect. Dialysis Access: R AVF + bruit  Dialysis Orders: ([facility] MWF) 4hrs   EDW 90.5kg    Heparin 2800    R AVF   Profile 2 Hectorol 2 mcg      Epogen none     Venofer  none July Labs: Phos 3.6, PTH 198.5. Tsat 24% with Ferritin > 2000  Assessment/Plan: 1. Atherosclerotic PVD with ulceration/ osteo of left heel -  VVS following. Left foot likely non-salvageable. Op BC's drawn 7/9 with gram neg bacilli. On Vanc and Zosyn pending repeat BC's. Left AKA planned for tomorrow.  2. ESRD -  MWF -  HD today. K 4.6. No heparin on HD starting Wednesday. 3. Hypertension/Volume - SBPs 110's-120's on midodrine. No issues with op EDW. Up by ~2.5 kg here (bed wgts). Try for UF 3L today 4. Anemia - Hgb 11.8. No op ESA's or iron. Last Tsat 24% with Ferritin > 2000 (inflammation). Follow post op CBC. 5. Metabolic bone disease -  Ca 9.7 (11.3 corrected) Phos 2.9 here . Last PTH 198.5. On Sensipar 60. Hold hectorol for now. Low Ca bath. Hold renvela for low Phos. 6. Nutrition - Alb 1.9. High prot renal diet, nepro, multivitamin. NPO after midnight 7. DM Type II - On insulin per primary  Claud Kelp, PA-C [facility] Pager [phone] 03/26/2013, 2:11 PM   Patient seen and examined.  Agree with assessment and plan as above.  Pt with ESRD and prior R BKA is struggling with non-healing wounds on L foot.  Recent outpt BCx's were + for GNR and now pt admitted for anticipated LLE amputation. Is on dialysis, alert and in no distress.  Rec's as above.  Vinson Moselle  MD Pager [phone]    Cell  [phone] 03/26/2013, 5:54 PM

## 2013-03-26 NOTE — Procedures (Signed)
I was present at this dialysis session. I have reviewed the session itself and made appropriate changes.   Candace Moselle  MD Pager [phone]    Cell  [phone] 03/26/2013, 5:57 PM

## 2013-03-27 ENCOUNTER — Inpatient Hospital Stay (HOSPITAL_COMMUNITY): Payer: Medicare Other | Admitting: Anesthesiology

## 2013-03-27 ENCOUNTER — Encounter (HOSPITAL_COMMUNITY)
Admission: EM | Disposition: A | Discharge status: Rehabilitation Facility | Payer: Self-pay | Source: Home / Self Care | Attending: Internal Medicine

## 2013-03-27 ENCOUNTER — Encounter (HOSPITAL_COMMUNITY): Payer: Self-pay | Admitting: Anesthesiology

## 2013-03-27 ENCOUNTER — Encounter (HOSPITAL_COMMUNITY): Payer: Self-pay | Admitting: Certified Registered"

## 2013-03-27 ENCOUNTER — Telehealth: Payer: Self-pay | Admitting: Vascular Surgery

## 2013-03-27 DIAGNOSIS — I739 Peripheral vascular disease, unspecified: Secondary | ICD-10-CM

## 2013-03-27 DIAGNOSIS — L98499 Non-pressure chronic ulcer of skin of other sites with unspecified severity: Secondary | ICD-10-CM

## 2013-03-27 DIAGNOSIS — S78119A Complete traumatic amputation at level between unspecified hip and knee, initial encounter: Secondary | ICD-10-CM

## 2013-03-27 DIAGNOSIS — I70269 Atherosclerosis of native arteries of extremities with gangrene, unspecified extremity: Secondary | ICD-10-CM

## 2013-03-27 HISTORY — PX: AMPUTATION: SHX166

## 2013-03-27 LAB — SURGICAL PCR SCREEN: Staphylococcus aureus: NEGATIVE

## 2013-03-27 LAB — GLUCOSE, CAPILLARY
Glucose-Capillary: 112 mg/dL — ABNORMAL HIGH (ref 70–99)
Glucose-Capillary: 80 mg/dL (ref 70–99)
Glucose-Capillary: 81 mg/dL (ref 70–99)
Glucose-Capillary: 92 mg/dL (ref 70–99)

## 2013-03-27 LAB — CBC
HCT: 32.7 % — ABNORMAL LOW (ref 36.0–46.0)
Hemoglobin: 10.3 g/dL — ABNORMAL LOW (ref 12.0–15.0)
RBC: 3.84 MIL/uL — ABNORMAL LOW (ref 3.87–5.11)

## 2013-03-27 LAB — BASIC METABOLIC PANEL
BUN: 13 mg/dL (ref 6–23)
CO2: 30 mEq/L (ref 19–32)
Glucose, Bld: 88 mg/dL (ref 70–99)
Potassium: 3.5 mEq/L (ref 3.5–5.1)
Sodium: 134 mEq/L — ABNORMAL LOW (ref 135–145)

## 2013-03-27 SURGERY — AMPUTATION, ABOVE KNEE
Anesthesia: General | Site: Leg Upper | Laterality: Left | Wound class: Clean

## 2013-03-27 MED ORDER — ONDANSETRON HCL 4 MG/2ML IJ SOLN: 4.0000 mg | Freq: Four times a day (QID) | INTRAMUSCULAR | Status: DC | PRN

## 2013-03-27 MED ORDER — MIDAZOLAM HCL 5 MG/5ML IJ SOLN
INTRAMUSCULAR | Status: DC | PRN
  Administered 2013-03-27: 2 mg via INTRAVENOUS

## 2013-03-27 MED ORDER — PHENYLEPHRINE HCL 10 MG/ML IJ SOLN
INTRAMUSCULAR | Status: DC | PRN
  Administered 2013-03-27: 120 ug via INTRAVENOUS
  Administered 2013-03-27: 160 ug via INTRAVENOUS
  Administered 2013-03-27: 200 ug via INTRAVENOUS
  Administered 2013-03-27: 120 ug via INTRAVENOUS
  Administered 2013-03-27: 200 ug via INTRAVENOUS

## 2013-03-27 MED ORDER — ONDANSETRON HCL 4 MG/2ML IJ SOLN
INTRAMUSCULAR | Status: DC | PRN
  Administered 2013-03-27: 4 mg via INTRAVENOUS

## 2013-03-27 MED ORDER — FENTANYL CITRATE 0.05 MG/ML IJ SOLN: 25.0000 ug | INTRAMUSCULAR | Status: DC | PRN

## 2013-03-27 MED ORDER — PANTOPRAZOLE SODIUM 40 MG PO TBEC
40.0000 mg | DELAYED_RELEASE_TABLET | Freq: Every day | ORAL | Status: DC
  Administered 2013-03-27 – 2013-03-30 (×4): 40 mg via ORAL
  Filled 2013-03-27 (×4): qty 1

## 2013-03-27 MED ORDER — ALUM & MAG HYDROXIDE-SIMETH 200-200-20 MG/5ML PO SUSP
15.0000 mL | ORAL | Status: DC | PRN
  Filled 2013-03-27: qty 30

## 2013-03-27 MED ORDER — DOCUSATE SODIUM 100 MG PO CAPS
100.0000 mg | ORAL_CAPSULE | Freq: Every day | ORAL | Status: DC
  Administered 2013-03-28 – 2013-03-30 (×3): 100 mg via ORAL
  Filled 2013-03-27 (×3): qty 1

## 2013-03-27 MED ORDER — PHENOL 1.4 % MT LIQD
1.0000 | OROMUCOSAL | Status: DC | PRN
  Filled 2013-03-27: qty 177

## 2013-03-27 MED ORDER — PROPOFOL 10 MG/ML IV BOLUS
INTRAVENOUS | Status: DC | PRN
  Administered 2013-03-27: 150 mg via INTRAVENOUS

## 2013-03-27 MED ORDER — 0.9 % SODIUM CHLORIDE (POUR BTL) OPTIME
TOPICAL | Status: DC | PRN
  Administered 2013-03-27: 1000 mL

## 2013-03-27 MED ORDER — OXYCODONE HCL 5 MG/5ML PO SOLN: 5.0000 mg | Freq: Once | ORAL | Status: AC | PRN

## 2013-03-27 MED ORDER — FENTANYL CITRATE 0.05 MG/ML IJ SOLN
INTRAMUSCULAR | Status: DC | PRN
  Administered 2013-03-27: 100 ug via INTRAVENOUS
  Administered 2013-03-27: 50 ug via INTRAVENOUS

## 2013-03-27 MED ORDER — SODIUM CHLORIDE 0.9 % IV SOLN
INTRAVENOUS | Status: DC | PRN
  Administered 2013-03-27 (×2): via INTRAVENOUS

## 2013-03-27 MED ORDER — EPHEDRINE SULFATE 50 MG/ML IJ SOLN
INTRAMUSCULAR | Status: DC | PRN
  Administered 2013-03-27 (×2): 10 mg via INTRAVENOUS
  Administered 2013-03-27: 15 mg via INTRAVENOUS

## 2013-03-27 MED ORDER — HYDRALAZINE HCL 20 MG/ML IJ SOLN: 10.0000 mg | INTRAMUSCULAR | Status: DC | PRN

## 2013-03-27 MED ORDER — MORPHINE SULFATE 2 MG/ML IJ SOLN
2.0000 mg | INTRAMUSCULAR | Status: DC | PRN
  Administered 2013-03-27 – 2013-03-28 (×6): 2 mg via INTRAVENOUS
  Filled 2013-03-27 (×6): qty 1

## 2013-03-27 MED ORDER — GUAIFENESIN-DM 100-10 MG/5ML PO SYRP
15.0000 mL | ORAL_SOLUTION | ORAL | Status: DC | PRN
  Filled 2013-03-27: qty 15

## 2013-03-27 MED ORDER — DEXTROSE 5 % IV SOLN
1.5000 g | Freq: Two times a day (BID) | INTRAVENOUS | Status: AC
  Administered 2013-03-27 – 2013-03-28 (×2): 1.5 g via INTRAVENOUS
  Filled 2013-03-27 (×2): qty 1.5

## 2013-03-27 MED ORDER — ENOXAPARIN SODIUM 30 MG/0.3ML ~~LOC~~ SOLN
30.0000 mg | SUBCUTANEOUS | Status: DC
  Administered 2013-03-27 – 2013-03-29 (×3): 30 mg via SUBCUTANEOUS
  Filled 2013-03-27 (×4): qty 0.3

## 2013-03-27 MED ORDER — METOPROLOL TARTRATE 1 MG/ML IV SOLN
2.0000 mg | INTRAVENOUS | Status: DC | PRN
  Filled 2013-03-27: qty 5

## 2013-03-27 MED ORDER — SODIUM CHLORIDE 0.9 % IV SOLN
10.0000 mg | INTRAVENOUS | Status: DC | PRN
  Administered 2013-03-27: 50 ug/min via INTRAVENOUS

## 2013-03-27 MED ORDER — ACETAMINOPHEN 325 MG PO TABS: 325.0000 mg | ORAL_TABLET | ORAL | Status: DC | PRN

## 2013-03-27 MED ORDER — LABETALOL HCL 5 MG/ML IV SOLN
10.0000 mg | INTRAVENOUS | Status: DC | PRN
  Filled 2013-03-27: qty 4

## 2013-03-27 MED ORDER — POTASSIUM CHLORIDE CRYS ER 20 MEQ PO TBCR: 20.0000 meq | EXTENDED_RELEASE_TABLET | Freq: Once | ORAL | Status: AC | PRN

## 2013-03-27 MED ORDER — LIDOCAINE HCL (CARDIAC) 20 MG/ML IV SOLN
INTRAVENOUS | Status: DC | PRN
  Administered 2013-03-27: 80 mg via INTRAVENOUS

## 2013-03-27 MED ORDER — ARTIFICIAL TEARS OP OINT
TOPICAL_OINTMENT | OPHTHALMIC | Status: DC | PRN
  Administered 2013-03-27: 1 via OPHTHALMIC

## 2013-03-27 MED ORDER — OXYCODONE HCL 5 MG PO TABS: 5.0000 mg | ORAL_TABLET | Freq: Once | ORAL | Status: AC | PRN

## 2013-03-27 MED ORDER — ACETAMINOPHEN 650 MG RE SUPP: 325.0000 mg | RECTAL | Status: DC | PRN

## 2013-03-27 MED ORDER — MAGNESIUM SULFATE 40 MG/ML IJ SOLN
2.0000 g | Freq: Once | INTRAMUSCULAR | Status: AC | PRN
  Filled 2013-03-27: qty 50

## 2013-03-27 MED ORDER — OXYCODONE HCL 5 MG PO TABS
5.0000 mg | ORAL_TABLET | ORAL | Status: DC | PRN
  Administered 2013-03-27: 5 mg via ORAL
  Administered 2013-03-28 – 2013-03-29 (×5): 10 mg via ORAL
  Filled 2013-03-27 (×5): qty 2

## 2013-03-27 SURGICAL SUPPLY — 62 items
BANDAGE ELASTIC 4 VELCRO ST LF (GAUZE/BANDAGES/DRESSINGS) ×2 IMPLANT
BANDAGE ELASTIC 6 VELCRO ST LF (GAUZE/BANDAGES/DRESSINGS) ×2 IMPLANT
BANDAGE ESMARK 6X9 LF (GAUZE/BANDAGES/DRESSINGS) IMPLANT
BANDAGE GAUZE ELAST BULKY 4 IN (GAUZE/BANDAGES/DRESSINGS) ×2 IMPLANT
BLADE SAW RECIP 87.9 MT (BLADE) ×2 IMPLANT
BNDG CMPR 9X6 STRL LF SNTH (GAUZE/BANDAGES/DRESSINGS) ×1
BNDG COHESIVE 6X5 TAN STRL LF (GAUZE/BANDAGES/DRESSINGS) ×2 IMPLANT
BNDG ESMARK 6X9 LF (GAUZE/BANDAGES/DRESSINGS) ×2
CANISTER SUCTION 2500CC (MISCELLANEOUS) ×2 IMPLANT
CANISTER WOUND CARE 500ML ATS (WOUND CARE) ×1 IMPLANT
CLIP TI MEDIUM 6 (CLIP) IMPLANT
CLOTH BEACON ORANGE TIMEOUT ST (SAFETY) ×2 IMPLANT
COVER SURGICAL LIGHT HANDLE (MISCELLANEOUS) ×2 IMPLANT
CUFF TOURNIQUET SINGLE 18IN (TOURNIQUET CUFF) IMPLANT
CUFF TOURNIQUET SINGLE 24IN (TOURNIQUET CUFF) IMPLANT
CUFF TOURNIQUET SINGLE 34IN LL (TOURNIQUET CUFF) ×1 IMPLANT
CUFF TOURNIQUET SINGLE 44IN (TOURNIQUET CUFF) IMPLANT
DRAIN CHANNEL 19F RND (DRAIN) IMPLANT
DRAPE ORTHO SPLIT 77X108 STRL (DRAPES) ×4
DRAPE PROXIMA HALF (DRAPES) ×3 IMPLANT
DRAPE SURG ORHT 6 SPLT 77X108 (DRAPES) ×2 IMPLANT
DRAPE U-SHAPE 47X51 STRL (DRAPES) ×2 IMPLANT
DRSG ADAPTIC 3X8 NADH LF (GAUZE/BANDAGES/DRESSINGS) ×2 IMPLANT
DRSG MEPITEL 3X4 ME34 (GAUZE/BANDAGES/DRESSINGS) ×2 IMPLANT
DRSG VAC ATS MED SENSATRAC (GAUZE/BANDAGES/DRESSINGS) ×1 IMPLANT
ELECT REM PT RETURN 9FT ADLT (ELECTROSURGICAL) ×2
ELECTRODE REM PT RTRN 9FT ADLT (ELECTROSURGICAL) ×1 IMPLANT
EVACUATOR SILICONE 100CC (DRAIN) IMPLANT
GLOVE BIO SURGEON STRL SZ7.5 (GLOVE) ×3 IMPLANT
GLOVE BIOGEL PI IND STRL 6.5 (GLOVE) IMPLANT
GLOVE BIOGEL PI IND STRL 7.0 (GLOVE) IMPLANT
GLOVE BIOGEL PI IND STRL 8 (GLOVE) ×1 IMPLANT
GLOVE BIOGEL PI INDICATOR 6.5 (GLOVE) ×1
GLOVE BIOGEL PI INDICATOR 7.0 (GLOVE) ×2
GLOVE BIOGEL PI INDICATOR 8 (GLOVE) ×1
GLOVE ECLIPSE 6.0 STRL STRAW (GLOVE) ×1 IMPLANT
GLOVE SS BIOGEL STRL SZ 6.5 (GLOVE) IMPLANT
GLOVE SUPERSENSE BIOGEL SZ 6.5 (GLOVE) ×1
GLOVE SURG SS PI 7.5 STRL IVOR (GLOVE) ×2 IMPLANT
GOWN STRL NON-REIN LRG LVL3 (GOWN DISPOSABLE) ×7 IMPLANT
KIT BASIN OR (CUSTOM PROCEDURE TRAY) ×2 IMPLANT
KIT ROOM TURNOVER OR (KITS) ×2 IMPLANT
NS IRRIG 1000ML POUR BTL (IV SOLUTION) ×2 IMPLANT
PACK GENERAL/GYN (CUSTOM PROCEDURE TRAY) ×2 IMPLANT
PAD ARMBOARD 7.5X6 YLW CONV (MISCELLANEOUS) ×4 IMPLANT
PADDING CAST COTTON 6X4 STRL (CAST SUPPLIES) IMPLANT
SPONGE GAUZE 4X4 12PLY (GAUZE/BANDAGES/DRESSINGS) ×2 IMPLANT
SPONGE LAP 18X18 X RAY DECT (DISPOSABLE) ×1 IMPLANT
STAPLER VISISTAT 35W (STAPLE) ×2 IMPLANT
STOCKINETTE IMPERVIOUS LG (DRAPES) ×2 IMPLANT
SUT ETHILON 3 0 PS 1 (SUTURE) IMPLANT
SUT SILK 0 TIES 10X30 (SUTURE) ×2 IMPLANT
SUT SILK 2 0 (SUTURE) ×2
SUT SILK 2 0 SH CR/8 (SUTURE) ×2 IMPLANT
SUT SILK 2-0 18XBRD TIE 12 (SUTURE) ×1 IMPLANT
SUT SILK 3 0 (SUTURE) ×2
SUT SILK 3-0 18XBRD TIE 12 (SUTURE) ×1 IMPLANT
SUT VIC AB 2-0 CT1 18 (SUTURE) ×3 IMPLANT
TOWEL OR 17X24 6PK STRL BLUE (TOWEL DISPOSABLE) ×2 IMPLANT
TOWEL OR 17X26 10 PK STRL BLUE (TOWEL DISPOSABLE) ×2 IMPLANT
UNDERPAD 30X30 INCONTINENT (UNDERPADS AND DIAPERS) ×2 IMPLANT
WATER STERILE IRR 1000ML POUR (IV SOLUTION) ×2 IMPLANT

## 2013-03-27 NOTE — Progress Notes (Addendum)
[facility] PROGRESS NOTE  [name] UXL:[id number] DOB: 12/06/1955 DOA: 03/25/2013 PCP: No primary provider on file.  Assessment/Plan: Diabetic foot ulcer with probable acute osteomyelitis of the left calcaneus  -probe to bone test was positive  -X-ray of the left foot suggested osteomyelitis  -on  Empiric vancomycin and Zosyn follow blood cultures -s/p AKA- today 7/15 Peripheral vascular disease with left lower extremity ulcerations   -Status post left femoral popliteal bypass 02/20/2013  -continue Oxycodone 5 mg every 6 hours when necessary pain  Ischemic cardiomyopathy  -Continue aspirin and Plavix  Diabetes mellitus type 2  -continue decreased dose Lantus for now, follow and increase post op when tolerating po well, as appropriate, continue NovoLog sliding scale  ESRD  -dialysis per renal -The patient takes Midrin 10 mg Monday, Wednesday, Fridays before dialysis  Hyperlipidemia  -Continue Lipitor    Code Status: full Family Communication: sister at bedisde Disposition Plan: pending clinical course   Consultants:  Vascular- Dr Darrick Penna  renal  Procedures:  L. AKA planned  Antibiotics:  Vanc and zosyn>>  HPI/Subjective: S/p AKA c/o pain  Objective: Filed Vitals:   03/27/13 0945 03/27/13 1000 03/27/13 1046 03/27/13 1400  BP: 106/54 98/60 82/53  98/63  Pulse: 99 97 96 99  Temp:  97.6 F (36.4 C) 98.7 F (37.1 C) 98.5 F (36.9 C)  TempSrc:   Oral Oral  Resp:   18 18  Height:      Weight:      SpO2: 100% 100% 98% 95%    Intake/Output Summary (Last 24 hours) at 03/27/13 1428 Last data filed at 03/27/13 1000  Gross per 24 hour  Intake    525 ml  Output   6150 ml  Net  -5625 ml   Filed Weights   03/25/13 2201 03/26/13 1415 03/26/13 2152  Weight: 92.2 kg (203 lb 4.2 oz) 92.6 kg (204 lb 2.3 oz) 89.8 kg (197 lb 15.6 oz)   Exam:  General: alert & oriented x 3 In NAD Cardiovascular: RRR, nl S1 s2 Respiratory: CTAB Abdomen: soft +BS NT/ND,  no masses palpable Extremities: R. BKA, L.AKA with wound vac    Data Reviewed: Basic Metabolic Panel:  Recent Labs Lab 03/25/13 0916 03/26/13 0430 03/27/13 0605  NA 133* 134* 134*  K 3.8 4.6 3.5  CL 91* 93* 93*  CO2 31 31 30   GLUCOSE 125* 86 88  BUN 23 27* 13  CREATININE 4.27* 5.22* 3.68*  CALCIUM 9.9 9.7 9.4  MG 2.2  --   --   PHOS 2.9  --   --    Liver Function Tests:  Recent Labs Lab 03/26/13 0430  AST 18  ALT 15  ALKPHOS 108  BILITOT 0.6  PROT 7.1  ALBUMIN 1.9*   No results found for this basename: LIPASE, AMYLASE,  in the last 168 hours No results found for this basename: AMMONIA,  in the last 168 hours CBC:  Recent Labs Lab 03/25/13 0916 03/27/13 0605  WBC 13.9* 11.1*  NEUTROABS 11.4*  --   HGB 11.8* 10.3*  HCT 35.2* 32.7*  MCV 84.0 85.2  PLT 142* 172   Cardiac Enzymes:  Recent Labs Lab 03/25/13 0917  TROPONINI <0.30   BNP (last 3 results) No results found for this basename: PROBNP,  in the last 8760 hours CBG:  Recent Labs Lab 03/27/13 0001 03/27/13 0622 03/27/13 0641 03/27/13 0908 03/27/13 1050  GLUCAP 92 81 80 112* 106*    Recent Results (from the past 240 hour(s))  CULTURE, BLOOD (ROUTINE X 2)     Status: None   Collection Time    03/25/13  9:17 AM      Result Value Range Status   Specimen Description BLOOD LEFT ARM   Final   Special Requests BOTTLES DRAWN AEROBIC ONLY 10CC   Final   Culture  Setup Time 03/25/2013 20:46   Final   Culture     Final   Value:        BLOOD CULTURE RECEIVED NO GROWTH TO DATE CULTURE WILL BE HELD FOR 5 DAYS BEFORE ISSUING A FINAL NEGATIVE REPORT   Report Status PENDING   Incomplete  CULTURE, BLOOD (ROUTINE X 2)     Status: None   Collection Time    03/25/13  9:22 AM      Result Value Range Status   Specimen Description BLOOD LEFT HAND   Final   Special Requests BOTTLES DRAWN AEROBIC ONLY 3CC   Final   Culture  Setup Time 03/25/2013 20:46   Final   Culture     Final   Value:        BLOOD  CULTURE RECEIVED NO GROWTH TO DATE CULTURE WILL BE HELD FOR 5 DAYS BEFORE ISSUING A FINAL NEGATIVE REPORT   Report Status PENDING   Incomplete  WOUND CULTURE     Status: None   Collection Time    03/26/13  1:39 PM      Result Value Range Status   Specimen Description WOUND LEFT FOOT   Final   Special Requests LEFT HEEL   Final   Gram Stain     Final   Value: NO WBC SEEN     NO SQUAMOUS EPITHELIAL CELLS SEEN     NO ORGANISMS SEEN   Culture Culture reincubated for better growth   Final   Report Status PENDING   Incomplete  SURGICAL PCR SCREEN     Status: None   Collection Time    03/27/13  1:25 AM      Result Value Range Status   MRSA, PCR NEGATIVE  NEGATIVE Final   Staphylococcus aureus NEGATIVE  NEGATIVE Final   Comment:            The Xpert SA Assay (FDA     approved for NASAL specimens     in patients over 45 years of age),     is one component of     a comprehensive surveillance     program.  Test performance has     been validated by [employer] for patients greater     than or equal to 49 year old.     It is not intended     to diagnose infection nor to     guide or monitor treatment.     Studies: No results found.  Scheduled Meds: . antiseptic oral rinse  15 mL Mouth Rinse BID  . atorvastatin  10 mg Oral QAC breakfast  . cefUROXime (ZINACEF)  IV  1.5 g Intravenous Q12H  . cinacalcet  60 mg Oral Q breakfast  . collagenase  1 application Topical QODAY  . [START ON 03/28/2013] docusate sodium  100 mg Oral Daily  . enoxaparin (LOVENOX) injection  30 mg Subcutaneous Q24H  . insulin aspart  0-9 Units Subcutaneous TID WC  . insulin glargine  6 Units Subcutaneous QHS  . midodrine  10 mg Oral 3 times weekly  . multivitamin  1 tablet Oral Q supper  .  pantoprazole  40 mg Oral Daily  . piperacillin-tazobactam (ZOSYN)  IV  2.25 g Intravenous Q8H  . sodium chloride  3 mL Intravenous Q12H  . vancomycin  1,000 mg Intravenous Q M,W,F-HD   Continuous Infusions:    Active Problems:   HYPERLIPIDEMIA   End stage renal disease   Atherosclerosis of native arteries of the extremities with ulceration(440.23)   Cardiomyopathy, ischemic   Atherosclerotic PVD with ulceration   Acute osteomyelitis of foot    Time spent: 25    [facility]  [facility] Pager [phone]. If 7PM-7AM, please contact night-coverage at [email or web address], password [facility] 03/27/2013, 2:28 PM  LOS: 2 days

## 2013-03-27 NOTE — Op Note (Signed)
NAME: [name]   MRN: [id number] DOB: 12-10-1955    DATE OF OPERATION: 03/27/2013  PREOP DIAGNOSIS: gangrene of the left foot with osteomyelitis  POSTOP DIAGNOSIS: same  PROCEDURE: left above-the-knee amputation  SURGEON: [name]. Edilia Bo, MD, FACS  ASSIST: Della Goo PA  ANESTHESIA: Gen.   EBL: minimal  INDICATIONS: [name] is a 57 y.o. female who had extensive osteomyelitis of her left heel with a nonhealing wound despite a functioning femoropopliteal bypass graft. Given the extent of the wound which was not improving despite improved circulation, I felt the only remaining option was primary amputation.  FINDINGS: marked edema of the soft tissue.no evidence of gross infection. Muscle appeared adequately perfused.  TECHNIQUE: Patient was taken to the operating room and received a general anesthetic. The left lower extremity was prepped and draped in the usual sterile fashion. A fishmouth incision was made above the level of the patella. The leg was exsanguinated with an Esmarch bandage and the tourniquet inflated to 300 mmHg. Under tourniquet control, the incision was carried down to the skin, subcutaneous tissue, muscle and fascia to the femur which was dissected free circumferentially. The periosteum was elevated and the bone divided proximal to the level of skin division. The femoral arteries and veins were oversewn with 2-0 silk sutures. The femoropopliteal bypass graft was oversewn with a 2-0 silk suture. The tourniquet was then released. Digital hemostasis was obtained using electrocautery. The edges of the bone were rasped. The wound was irrigated with copious amounts of saline. Fascial layer was closed with interrupted 2-0 Vicryl. The skin was closed staples. An incisional VAC was placed. The patient tolerated the procedure well and was transferred to the recovery room in stable condition. All needle and sponge counts were correct.  Waverly Ferrari, MD,  FACS [facility]  DATE OF DICTATION:   03/27/2013

## 2013-03-27 NOTE — H&P (View-Only) (Signed)
VASCULAR PROGRESS NOTE  SUBJECTIVE: No complaints  PHYSICAL EXAM: Filed Vitals:   03/25/13 1817 03/25/13 1900 03/25/13 2201 03/26/13 0610  BP: 108/69  110/59 114/62  Pulse: 90  87 92  Temp: 98.4 F (36.9 C)  98.1 F (36.7 C) 98.5 F (36.9 C)  TempSrc: Oral  Oral Oral  Resp: 20  18 18  Height:  5' 9" (1.753 m)    Weight:   203 lb 4.2 oz (92.2 kg)   SpO2: 97%  99% 99%   Left groin wound with reasonable granulation tissue. Thigh wound same. Below the knee incision with moderate drainage. Left heel with moderate drainage.  LABS: Lab Results  Component Value Date   WBC 13.9* 03/25/2013   HGB 11.8* 03/25/2013   HCT 35.2* 03/25/2013   MCV 84.0 03/25/2013   PLT 142* 03/25/2013   Lab Results  Component Value Date   CREATININE 5.22* 03/26/2013   Lab Results  Component Value Date   INR 1.29 02/19/2013   CBG (last 3)   Recent Labs  03/23/13 1443 03/25/13 1756 03/25/13 2205  GLUCAP 155* 102* 92    Active Problems:   HYPERLIPIDEMIA   End stage renal disease   Atherosclerosis of native arteries of the extremities with ulceration(440.23)   Cardiomyopathy, ischemic   Atherosclerotic PVD with ulceration   Acute osteomyelitis of foot  X-ray Left foot: Evidence of osteomyelitis of the posterior calcaneus with overt  separation of bone at the insertion site of the Achilles tendon. Extensive osteo of heel.  ASSESSMENT AND PLAN:  * This patient has extensive osteomyelitis of the left heel. This would require extensive debridement of the bone and would not leave a foot was salvageable. She has a right BKA which took months to heal many years ago. She has a nonhealing wound below the knee on the left and I think she would be at very high risk for not healing a left alone the knee amputation. For this reason, I have recommended Left AKA. She has hemodialysis today and I will schedule her surgery for tomorrow. I have discussed the situation with her in detail and she is agreeable to  proceed.  * The patient is on IV Zosyn and IV Vanco. Blood cultures are pending. I do not see any cultures of the left heel wound.  * DVT prophylaxis: The patient is on subcutaneous heparin 3 times a day.   Chris Ercil Cassis Beeper: [phone] 03/26/2013    

## 2013-03-27 NOTE — Telephone Encounter (Addendum)
Message copied by Rosalyn Charters on Tue Mar 27, 2013 11:02 AM ------      Message from: Marlowe Shores      Created: Tue Mar 27, 2013  9:04 AM       4 weeks _ left AKA Edilia Bo -----  -notified patient  of fu appt. on 04-25-14 at 10:15 with dr. Edilia Bo

## 2013-03-27 NOTE — Consult Note (Signed)
Physical Medicine and Rehabilitation Consult Reason for Consult: L-BKA Referring Physician:  Dr. Edilia Bo.  HPI: [name] is a 57 y.o. female with history of SLE, DM type 2, ESRD--failed transplant, PVD-s/p R BKA with chronic ulcers left heel and and left thigh wound s/p L-FPBG.  She's well known to rehab from prior stays past R-BKA and CABG. She was admitted on  07/013/14 past Fall and reports of 1-2 week history of weakness. Left heel ulcer with necrotic material and foul odor and xrays done revealed osteomyelitis with bone avulsion. She was started on IV antibiotics and vascular felt that foot was not salvageable. On 07/15, patient underwent L-AKA by Dr. Edilia Bo. Therapy evaluations pending. Patient was independent with use of prosthesis and used to drive to dialysis PTA. MD recommending CIR.    Review of Systems  HENT: Negative for hearing loss.   Eyes: Negative for blurred vision and double vision.  Respiratory: Positive for shortness of breath. Negative for cough and wheezing.   Cardiovascular: Negative for chest pain and palpitations.  Gastrointestinal: Negative for heartburn and nausea.  Musculoskeletal: Positive for myalgias and back pain (past two months).  Neurological: Negative for headaches.   Past Medical History  Diagnosis Date  . Diabetes mellitus, type 2   . Secondary hyperparathyroidism   . OSA (obstructive sleep apnea)     NPSG 11.23.11-AHI 64.9/hr-central and obstructive apnea  . Ischemic cardiomyopathy     EF 25-30% 2011  . CAD (coronary artery disease)   . Pulmonary hypertension     Est PAsyst 60-Dr Mendel Ryder, off coumadin, CT neg for PE  . Hyperlipidemia   . Anemia   . Thyroid disease   . Pulmonary edema   . Peripheral vascular disease   . Sebaceous cyst   . 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 Velvet Bathe  . Irregular heart beat   . Shortness of breath     with exertion  . Hemodialysis patient     M,W,F  . TIA (transient ischemic attack) June 2014   . Ulcer of heel due to diabetes     left  . Kidney disease Dialysis MWF    ESRD secondary to DM, failed transplant 2006. Dialysis   Past Surgical History  Procedure Laterality Date  . Coronary artery bypass graft  2011    3V  . Insertion of dialysis catheter    . 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 Nasal mucosal biopsy    . Ovarian cyst surgery    . 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 Laser retinal surgery  2011  . Kidney transplant      rejection  . Abdominal angiogram Left 01/25/13  . Colonoscopy    . Cardiac catheterization    . Eye surgery    . Femoral-popliteal bypass graft Left 02/20/2013    Procedure:  FEMORAL-to below knee POPLITEAL ARTERY bypasswith saphenous vein with intraoperative angiogram ;  Surgeon: Chuck Hint, MD;  Location: [facility] OR;  Service: Vascular;  Laterality: Left;  fem-below knee pop   Family History  Problem Relation Age of Onset  . Heart disease    . Edema Father     fluid overload  . Cerebral aneurysm Mother   . Stroke Brother   . Heart disease Brother    Social History:   Lives alone. Independent PTA. Has niece and nephew who check in on her.  She reports that she has never  smoked. She has never used smokeless tobacco. She reports that she does not drink alcohol or use illicit drugs.   Allergies  Allergen Reactions  . Neurontin (Gabapentin) Anaphylaxis  . Ezetimibe-Simvastatin Other (See Comments)    REACTION: face edema; itching  . Klonopin (Clonazepam) Other (See Comments)    hallucination  . Sertraline Hcl Other (See Comments)    hallucination   Medications Prior to Admission  Medication Sig Dispense Refill  . acetaminophen (TYLENOL) 325 MG tablet Take 650 mg by mouth every 6 (six) hours as needed for pain.       [name] atorvastatin (LIPITOR) 10 MG tablet Take 10 mg by mouth daily before breakfast.       . B Complex-C (B-COMPLEX WITH VITAMIN C) tablet Take 1 tablet by  mouth daily.      . cinacalcet (SENSIPAR) 60 MG tablet Take 60 mg by mouth daily before breakfast.       . clopidogrel (PLAVIX) 75 MG tablet Take 1 tablet (75 mg total) by mouth daily.  30 tablet  0  . collagenase (SANTYL) ointment Apply 1 application topically every other day.      . ibuprofen (ADVIL,MOTRIN) 600 MG tablet Take 600 mg by mouth every 6 (six) hours as needed for pain.       [name] insulin aspart (NOVOLOG) 100 UNIT/ML injection Inject 3-16 Units into the skin 3 (three) times daily before meals. Sliding scale      . insulin glargine (LANTUS) 100 UNIT/ML injection Inject 12 Units into the skin at bedtime.       . midodrine (PROAMATINE) 10 MG tablet Take 10 mg by mouth 3 (three) times a week. Take on Monday, Wednesday, and Friday      . oxyCODONE (OXY IR/ROXICODONE) 5 MG immediate release tablet Take 1 tablet (5 mg total) by mouth every 8 (eight) hours as needed for pain.  30 tablet  0  . Propylene Glycol (SYSTANE BALANCE OP) Place 1 drop into both eyes as needed (dry eyes). Dry eyes      . sevelamer (RENVELA) 800 MG tablet Take 2,400 mg by mouth 3 (three) times daily with meals. 3 tabs by mouth three times a day      . tetrahydrozoline-zinc (VISINE-AC) 0.05-0.25 % ophthalmic solution Place 2 drops into both eyes 3 (three) times daily as needed (for redness).      . cephALEXin (KEFLEX) 250 MG capsule Take 1 capsule (250 mg total) by mouth 2 (two) times daily.  28 capsule  0    Home: Home Living Family/patient expects to be discharged to:: Private residence Living Arrangements: Alone  Functional History:   Functional Status:  Mobility:          ADL:    Cognition: Cognition Orientation Level: Oriented X4    Blood pressure 82/53, pulse 96, temperature 98.7 F (37.1 C), temperature source Oral, resp. rate 18, height 5\' 9"  (1.753 m), weight 89.8 kg (197 lb 15.6 oz), SpO2 98.00%.   Physical Exam  Nursing note and vitals reviewed. Constitutional: She is oriented to person,  place, and time. She appears well-developed and well-nourished.  HENT:  Head: Normocephalic and atraumatic.  Eyes: Conjunctivae are normal. Pupils are equal, round, and reactive to light.  Neck: Normal range of motion.  Cardiovascular: Normal rate and regular rhythm.   Pulmonary/Chest: Effort normal and breath sounds normal. No respiratory distress. She has no wheezes.  Abdominal: Soft. Bowel sounds are normal. She exhibits no distension.  Musculoskeletal: She exhibits no tenderness.  R-BKA stump well healed.  Old callus. L-AKA with VAC in place. Left stump is appropriately tender. Pt can massage the limb gently on her own.  Neurological: She is alert and oriented to person, place, and time.  Alert and oriented. No cognitive deficits. No gross sensory loss. UE's are 4+/5. RLE is 4/5 at hip/knee. Can lift left hip agst gravity  Skin: Skin is warm and dry.  Psychiatric: She has a normal mood and affect. Her speech is normal and behavior is normal. Judgment and thought content normal. Cognition and memory are normal.    Results for orders placed during the hospital encounter of 03/25/13 (from the past 24 hour(s))  GLUCOSE, CAPILLARY     Status: Abnormal   Collection Time    03/26/13  7:11 PM      Result Value Range   Glucose-Capillary 58 (*) 70 - 99 mg/dL   Comment 1 Notify RN    GLUCOSE, CAPILLARY     Status: None   Collection Time    03/26/13  7:55 PM      Result Value Range   Glucose-Capillary 78  70 - 99 mg/dL   Comment 1 Documented in Chart     Comment 2 Notify RN    GLUCOSE, CAPILLARY     Status: None   Collection Time    03/27/13 12:01 AM      Result Value Range   Glucose-Capillary 92  70 - 99 mg/dL  SURGICAL PCR SCREEN     Status: None   Collection Time    03/27/13  1:25 AM      Result Value Range   MRSA, PCR NEGATIVE  NEGATIVE   Staphylococcus aureus NEGATIVE  NEGATIVE  CBC     Status: Abnormal   Collection Time    03/27/13  6:05 AM      Result Value Range   WBC  11.1 (*) 4.0 - 10.5 K/uL   RBC 3.84 (*) 3.87 - 5.11 MIL/uL   Hemoglobin 10.3 (*) 12.0 - 15.0 g/dL   HCT 81.1 (*) 91.4 - 78.2 %   MCV 85.2  78.0 - 100.0 fL   MCH 26.8  26.0 - 34.0 pg   MCHC 31.5  30.0 - 36.0 g/dL   RDW 95.6 (*) 21.3 - 08.6 %   Platelets 172  150 - 400 K/uL  BASIC METABOLIC PANEL     Status: Abnormal   Collection Time    03/27/13  6:05 AM      Result Value Range   Sodium 134 (*) 135 - 145 mEq/L   Potassium 3.5  3.5 - 5.1 mEq/L   Chloride 93 (*) 96 - 112 mEq/L   CO2 30  19 - 32 mEq/L   Glucose, Bld 88  70 - 99 mg/dL   BUN 13  6 - 23 mg/dL   Creatinine, Ser 5.78 (*) 0.50 - 1.10 mg/dL   Calcium 9.4  8.4 - 46.9 mg/dL   GFR calc non Af Amer 13 (*) >90 mL/min   GFR calc Af Amer 15 (*) >90 mL/min  GLUCOSE, CAPILLARY     Status: None   Collection Time    03/27/13  6:22 AM      Result Value Range   Glucose-Capillary 81  70 - 99 mg/dL  GLUCOSE, CAPILLARY     Status: None   Collection Time    03/27/13  6:41 AM      Result Value Range   Glucose-Capillary 80  70 - 99 mg/dL  Comment 1 Notify RN    GLUCOSE, CAPILLARY     Status: Abnormal   Collection Time    03/27/13  9:08 AM      Result Value Range   Glucose-Capillary 112 (*) 70 - 99 mg/dL   Comment 1 Notify RN    GLUCOSE, CAPILLARY     Status: Abnormal   Collection Time    03/27/13 10:50 AM      Result Value Range   Glucose-Capillary 106 (*) 70 - 99 mg/dL   No results found.  Assessment/Plan: Diagnosis: left AKA (prior right BKA with prosthesis) 1. Does the need for close, 24 hr/day medical supervision in concert with the patient's rehab needs make it unreasonable for this patient to be served in a less intensive setting? Yes 2. Co-Morbidities requiring supervision/potential complications: ESRD, CM, SLE, DM 3. Due to bladder management, bowel management, safety, skin/wound care, disease management, medication administration, pain management and patient education, does the patient require 24 hr/day rehab nursing?  Yes 4. Does the patient require coordinated care of a physician, rehab nurse, PT (1-2 hrs/day, 5 days/week) and OT (1-2 hrs/day, 5 days/week) to address physical and functional deficits in the context of the above medical diagnosis(es)? Yes Addressing deficits in the following areas: balance, endurance, locomotion, strength, transferring, bowel/bladder control, bathing, dressing, feeding, grooming, toileting and psychosocial support 5. Can the patient actively participate in an intensive therapy program of at least 3 hrs of therapy per day at least 5 days per week? Yes 6. The potential for patient to make measurable gains while on inpatient rehab is excellent 7. Anticipated functional outcomes upon discharge from inpatient rehab are mod I to supervision with PT, mod I to set up with OT, n/a with SLP. 8. Estimated rehab length of stay to reach the above functional goals is: 1-2 weeks 9. Does the patient have adequate social supports to accommodate these discharge functional goals? Yes 10. Anticipated D/C setting: Home 11. Anticipated post D/C treatments: HH therapy 12. Overall Rehab/Functional Prognosis: excellent  RECOMMENDATIONS: This patient's condition is appropriate for continued rehabilitative care in the following setting: CIR Patient has agreed to participate in recommended program. Yes Note that insurance prior authorization may be required for reimbursement for recommended care.  Comment: Therapy pending. Was using right BK prosthesis prior to this hospitalization. Can become fairly independent at a wheelchair level. Rehab RN to follow up.   Ranelle Oyster, MD, [place] [facility]     03/27/2013

## 2013-03-27 NOTE — Progress Notes (Signed)
[facility]  Patient Status: Active (receiving services up to time of hospitalization)  [facility] is providing the following services: RN and PT  If patient discharges after hours, please call [phone].   [name] 03/27/2013, 10:46 AM

## 2013-03-27 NOTE — Progress Notes (Signed)
Subjective: Had AKA on left earlier today, in bed resting, no distress. Outpt blood cx's are growing moraxella and pseudomonas.   Filed Vitals:   03/27/13 0945 03/27/13 1000 03/27/13 1046 03/27/13 1400  BP: 106/54 98/60 82/53  98/63  Pulse: 99 97 96 99  Temp:  97.6 F (36.4 C) 98.7 F (37.1 C) 98.5 F (36.9 C)  TempSrc:   Oral Oral  Resp:   18 18  Height:      Weight:      SpO2: 100% 100% 98% 95%   Physical Exam:  Blood pressure 98/63, pulse 99, temperature 98.5 F (36.9 C), temperature source Oral, resp. rate 18, height 5\' 9"  (1.753 m), weight 89.8 kg (197 lb 15.6 oz), SpO2 95.00%.  Gen: in bed, alert, in some pain No jvd Lungs: faint crackles L base, R clear Heart: regular, +S3 gallop, no murmur or rub Abd: soft, obese, liver down 4 cm Ext: new L AKA with wound vac in place, old R BKA, no LE edema or UE edema, L groin wound also with dressing in place Neuro: alert and oriented Dialysis Access: R AVF + bruit   Dialysis Orders: ([facility] MWF)  4hrs   EDW 90.5kg     Heparin 2800     R AVF    Profile 2  Hectorol 2 mcg       Epogen / Venofer- none  July Labs: Phos 3.6, PTH 198.5. Tsat 24% with Ferritin > 2000   Assessment/Plan:  1. L AKA / nonhealing L heel osteo / gram neg bacteremia- blood cx's from OP HD are growing moraxella and pseudomonas, sens pending. Temps ok, on vanc and Zosyn empirically for now 2. ESRD, cont MWF HD, hold heparin next HD 3. Hypotension- on midodrine, below dry wt, euvolemic on exam 4. Anemia - Hgb 11.8, no ESA 5. Metabolic bone disease - Ca 9.7 (11.3 corrected) Phos 2.9 here . Last PTH 198.5. On Sensipar 60. Hold hectorol for now. Low Ca bath. Hold renvela for low Phos. 6. Nutrition - Alb 1.9. High prot renal diet, nepro, multivitamin. NPO after midnight 7. DM Type II - On insulin per primary  Vinson Moselle  MD Pager [phone]    Cell  [phone] 03/27/2013, 4:12 PM   Recent Labs Lab 03/25/13 0916 03/26/13 0430 03/27/13 0605  NA 133* 134* 134*   K 3.8 4.6 3.5  CL 91* 93* 93*  CO2 31 31 30   GLUCOSE 125* 86 88  BUN 23 27* 13  CREATININE 4.27* 5.22* 3.68*  CALCIUM 9.9 9.7 9.4  PHOS 2.9  --   --      Recent Labs Lab 03/25/13 0916 03/27/13 0605  WBC 13.9* 11.1*  NEUTROABS 11.4*  --   HGB 11.8* 10.3*  HCT 35.2* 32.7*  MCV 84.0 85.2  PLT 142* 172

## 2013-03-27 NOTE — Transfer of Care (Signed)
Immediate Anesthesia Transfer of Care Note  Patient: [name]  Procedure(s) Performed: Procedure(s): AMPUTATION ABOVE KNEE (Left)  Patient Location: PACU  Anesthesia Type:General  Level of Consciousness: awake, alert  and oriented  Airway & Oxygen Therapy: Patient Spontanous Breathing and Patient connected to face mask oxygen  Post-op Assessment: Report given to PACU RN  Post vital signs: Reviewed and stable  Complications: No apparent anesthesia complications

## 2013-03-27 NOTE — Interval H&P Note (Signed)
History and Physical Interval Note:  03/27/2013 7:08 AM  [name]  has presented today for surgery, with the diagnosis of Peripheral Vascular Disease with Ulceration Osteomyelitis of left foot  The various methods of treatment have been discussed with the patient and family. After consideration of risks, benefits and other options for treatment, the patient has consented to  Procedure(s): AMPUTATION ABOVE KNEE (Left) as a surgical intervention .  The patient's history has been reviewed, patient examined, no change in status, stable for surgery.  I have reviewed the patient's chart and labs.  Questions were answered to the patient's satisfaction.     DICKSON,CHRISTOPHER S

## 2013-03-27 NOTE — Anesthesia Postprocedure Evaluation (Signed)
Anesthesia Post Note  Patient: [name]  Procedure(s) Performed: Procedure(s) (LRB): AMPUTATION ABOVE KNEE (Left)  Anesthesia type: General  Patient location: PACU  Post pain: Pain level controlled and Adequate analgesia  Post assessment: Post-op Vital signs reviewed, Patient's Cardiovascular Status Stable, Respiratory Function Stable, Patent Airway and Pain level controlled  Last Vitals:  Filed Vitals:   03/27/13 0903  BP: 120/69  Pulse: 108  Temp: 36.6 C  Resp: 24    Post vital signs: Reviewed and stable  Level of consciousness: awake, alert  and oriented  Complications: No apparent anesthesia complications

## 2013-03-27 NOTE — Progress Notes (Signed)
I will follow up after therapy evaluations to assist with rehab venue options. [phone]

## 2013-03-27 NOTE — Preoperative (Signed)
Beta Blockers   Reason not to administer Beta Blockers:Not Applicable 

## 2013-03-27 NOTE — Anesthesia Procedure Notes (Signed)
Procedure Name: LMA Insertion Date/Time: 03/27/2013 7:28 AM Performed by: Jefm Miles E Pre-anesthesia Checklist: Patient identified, Emergency Drugs available, Suction available, Patient being monitored and Timeout performed Patient Re-evaluated:Patient Re-evaluated prior to inductionOxygen Delivery Method: Circle system utilized Preoxygenation: Pre-oxygenation with 100% oxygen Intubation Type: IV induction Ventilation: Mask ventilation without difficulty LMA: LMA inserted LMA Size: 4.0 Number of attempts: 1 Placement Confirmation: positive ETCO2 and breath sounds checked- equal and bilateral Tube secured with: Tape Dental Injury: Teeth and Oropharynx as per pre-operative assessment

## 2013-03-27 NOTE — Anesthesia Preprocedure Evaluation (Addendum)
Anesthesia Evaluation  Patient identified by MRN, date of birth, ID band Patient awake    Reviewed: Allergy & Precautions, H&P , NPO status , Patient's Chart, lab work & pertinent test results  Airway Mallampati: II  Neck ROM: full    Dental  (+) Edentulous Upper, Partial Lower, Poor Dentition and Dental Advisory Given   Pulmonary shortness of breath, sleep apnea ,  Pulmonary HTN         Cardiovascular hypertension, + CAD, + Past MI, + CABG and + Peripheral Vascular Disease     Neuro/Psych TIA   GI/Hepatic   Endo/Other  diabetes, Type 2  Renal/GU ESRF and DialysisRenal disease     Musculoskeletal   Abdominal   Peds  Hematology   Anesthesia Other Findings   Reproductive/Obstetrics                          Anesthesia Physical Anesthesia Plan  ASA: IV  Anesthesia Plan: General   Post-op Pain Management:    Induction: Intravenous  Airway Management Planned: LMA  Additional Equipment:   Intra-op Plan:   Post-operative Plan:   Informed Consent: I have reviewed the patients History and Physical, chart, labs and discussed the procedure including the risks, benefits and alternatives for the proposed anesthesia with the patient or authorized representative who has indicated his/her understanding and acceptance.     Plan Discussed with: CRNA, Anesthesiologist and Surgeon  Anesthesia Plan Comments:         Anesthesia Quick Evaluation

## 2013-03-28 ENCOUNTER — Ambulatory Visit: Payer: Medicare Other | Admitting: Vascular Surgery

## 2013-03-28 DIAGNOSIS — N2581 Secondary hyperparathyroidism of renal origin: Secondary | ICD-10-CM

## 2013-03-28 DIAGNOSIS — N189 Chronic kidney disease, unspecified: Secondary | ICD-10-CM | POA: Diagnosis present

## 2013-03-28 DIAGNOSIS — I25810 Atherosclerosis of coronary artery bypass graft(s) without angina pectoris: Secondary | ICD-10-CM | POA: Diagnosis present

## 2013-03-28 DIAGNOSIS — A419 Sepsis, unspecified organism: Secondary | ICD-10-CM | POA: Diagnosis present

## 2013-03-28 DIAGNOSIS — I2589 Other forms of chronic ischemic heart disease: Secondary | ICD-10-CM

## 2013-03-28 LAB — CBC
Hemoglobin: 10.2 g/dL — ABNORMAL LOW (ref 12.0–15.0)
MCH: 28 pg (ref 26.0–34.0)
MCHC: 33 g/dL (ref 30.0–36.0)
MCV: 84.9 fL (ref 78.0–100.0)
RBC: 3.64 MIL/uL — ABNORMAL LOW (ref 3.87–5.11)

## 2013-03-28 LAB — BASIC METABOLIC PANEL
BUN: 19 mg/dL (ref 6–23)
CO2: 28 mEq/L (ref 19–32)
Calcium: 9.2 mg/dL (ref 8.4–10.5)
Glucose, Bld: 106 mg/dL — ABNORMAL HIGH (ref 70–99)
Potassium: 4 mEq/L (ref 3.5–5.1)
Sodium: 133 mEq/L — ABNORMAL LOW (ref 135–145)

## 2013-03-28 LAB — GLUCOSE, CAPILLARY
Glucose-Capillary: 111 mg/dL — ABNORMAL HIGH (ref 70–99)
Glucose-Capillary: 86 mg/dL (ref 70–99)
Glucose-Capillary: 88 mg/dL (ref 70–99)

## 2013-03-28 MED ORDER — NEPRO/CARBSTEADY PO LIQD
237.0000 mL | Freq: Two times a day (BID) | ORAL | Status: DC
  Administered 2013-03-28 – 2013-03-29 (×2): 237 mL via ORAL

## 2013-03-28 MED ORDER — PENTAFLUOROPROP-TETRAFLUOROETH EX AERO: 1.0000 "application " | INHALATION_SPRAY | CUTANEOUS | Status: DC | PRN

## 2013-03-28 MED ORDER — SODIUM CHLORIDE 0.9 % IV SOLN: 100.0000 mL | INTRAVENOUS | Status: DC | PRN

## 2013-03-28 MED ORDER — LIDOCAINE HCL (PF) 1 % IJ SOLN
5.0000 mL | INTRAMUSCULAR | Status: DC | PRN
Start: 2013-03-28 — End: 2013-03-28

## 2013-03-28 MED ORDER — GENTAMICIN SULFATE 40 MG/ML IJ SOLN
200.0000 mg | Freq: Once | INTRAVENOUS | Status: AC
  Administered 2013-03-28: 200 mg via INTRAVENOUS
  Filled 2013-03-28: qty 5

## 2013-03-28 MED ORDER — ALTEPLASE 2 MG IJ SOLR
2.0000 mg | Freq: Once | INTRAMUSCULAR | Status: DC | PRN
  Filled 2013-03-28: qty 2

## 2013-03-28 MED ORDER — NEPRO/CARBSTEADY PO LIQD: 237.0000 mL | ORAL | Status: DC | PRN

## 2013-03-28 MED ORDER — DARBEPOETIN ALFA-POLYSORBATE 100 MCG/0.5ML IJ SOLN
100.0000 ug | INTRAMUSCULAR | Status: DC
  Administered 2013-03-28: 100 ug via INTRAVENOUS

## 2013-03-28 MED ORDER — LIDOCAINE-PRILOCAINE 2.5-2.5 % EX CREA: 1.0000 "application " | TOPICAL_CREAM | CUTANEOUS | Status: DC | PRN

## 2013-03-28 MED ORDER — HEPARIN SODIUM (PORCINE) 1000 UNIT/ML DIALYSIS: 1000.0000 [IU] | INTRAMUSCULAR | Status: DC | PRN

## 2013-03-28 NOTE — Progress Notes (Signed)
[facility] PROGRESS NOTE  [name] ZHY:[id number] DOB: 03-01-1956 DOA: 03/25/2013 PCP: No primary provider on file.  Assessment/Plan:  Bacteremia with Morganella morganii and Pseudomonas from 7/9 blood cultures.   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  Peripheral vascular disease with left lower extremity ulcerations   -Status post left femoral popliteal bypass 02/20/2013 and left AKA 7/15 -continue Oxycodone 5 mg every 6 hours when necessary pain   Ischemic cardiomyopathy  -Continue aspirin and Plavix   Diabetes mellitus type 2, blood glucose control -continue decreased dose Lantus for now - continue NovoLog sliding scale   ESRD  -dialysis per renal -The patient takes Midrin 10 mg Monday, Wednesday, Fridays before dialysis   Hyperlipidemia  -Continue Lipitor     Code Status: full Family Communication: Spoke with patient alone Disposition Plan:  We'll make arrangements for outpatient gentamicin for the next 2 weeks.  We'll find out from vascular surgery whether she's going to continue to need a wound VAC on the left lower extremity, and if so for how long. We may need to arrange for this as an outpatient.  Planning for CIR when medically ready.   Consultants:  Vascular- Dr Darrick Penna, Dr. Edilia Bo  Nephrology  Procedures:  L. AKA 7/15  Antibiotics:  Vanc and zosyn 7/13 >> 7/16  Gentamicin 7/16 >> (anticipated end date of 7/29)  HPI/Subjective:  Has pain that is controlled in the left lower extremity. She has been eating okay, denies nausea, vomiting, abdominal pain. She is moving  her bowels all right.  She denies fevers and chills.    Objective: Filed Vitals:   03/28/13 1000 03/28/13 1030 03/28/13 1041 03/28/13 1119  BP: 101/66 109/88 111/70 196/55  Pulse: 88 91 99 100  Temp:   97.8 F (36.6 C) 98.8 F (37.1 C)  TempSrc:   Oral Oral  Resp: 19 16 18 18   Height:      Weight:   83.5 kg (184 lb 1.4 oz)   SpO2:   92% 96%    Intake/Output Summary (Last 24 hours) at 03/28/13 1321 Last data filed at 03/28/13 1041  Gross per 24 hour  Intake    120 ml  Output    -25 ml  Net    145 ml   Filed Weights   03/27/13 2108 03/28/13 0637 03/28/13 1041  Weight: 84.324 kg (185 lb 14.4 oz) 83.4 kg (183 lb 13.8 oz) 83.5 kg (184 lb 1.4 oz)   Exam:  General: African American female, alert & oriented x 3, NAD HEENT:  NCAT, MMM Cardiovascular: RRR, nl S1 s2, gallop present Respiratory: CTAB Abdomen: soft +BS NT/ND, no masses palpable Extremities: R. BKA, L.AKA with wound vac extending laterally along distal portion with good seal. There appears to be a small piece of foam that is used for comfort only and is not part of the seal on the lateral aspect of the leg.  Leg is warm, but there is no induration or erythema.    Data Reviewed: Basic Metabolic Panel:  Recent Labs Lab 03/25/13 0916 03/26/13 0430 03/27/13 0605 03/28/13 0500  NA 133* 134* 134* 133*  K 3.8 4.6 3.5 4.0  CL 91* 93* 93* 94*  CO2 31 31 30 28   GLUCOSE 125* 86 88 106*  BUN 23 27* 13 19  CREATININE 4.27* 5.22* 3.68* 4.50*  CALCIUM 9.9 9.7 9.4 9.2  MG 2.2  --   --   --   PHOS 2.9  --   --   --    Liver Function Tests:  Recent Labs Lab 03/26/13 0430  AST 18  ALT 15  ALKPHOS 108  BILITOT 0.6  PROT 7.1  ALBUMIN 1.9*   No results found for this basename: LIPASE, AMYLASE,  in the last 168 hours No results found for this basename: AMMONIA,  in the last 168 hours CBC:  Recent Labs Lab 03/25/13 0916 03/27/13 0605 03/28/13 0500  WBC 13.9* 11.1* 12.7*  NEUTROABS 11.4*  --   --   HGB 11.8*  10.3* 10.2*  HCT 35.2* 32.7* 30.9*  MCV 84.0 85.2 84.9  PLT 142* 172 174   Cardiac Enzymes:  Recent Labs Lab 03/25/13 0917  TROPONINI <0.30   BNP (last 3 results) No results found for this basename: PROBNP,  in the last 8760 hours CBG:  Recent Labs Lab 03/27/13 0908 03/27/13 1050 03/27/13 1624 03/27/13 2104 03/28/13 0315  GLUCAP 112* 106* 97 125* 111*    Recent Results (from the past 240 hour(s))  CULTURE, BLOOD (ROUTINE X 2)     Status: None   Collection Time    03/25/13  9:17 AM      Result Value Range Status   Specimen Description BLOOD LEFT ARM   Final   Special Requests BOTTLES DRAWN AEROBIC ONLY 10CC   Final   Culture  Setup Time 03/25/2013 20:46   Final   Culture     Final   Value:        BLOOD CULTURE RECEIVED NO GROWTH TO DATE CULTURE WILL BE HELD FOR 5 DAYS BEFORE ISSUING A FINAL NEGATIVE REPORT   Report Status PENDING   Incomplete  CULTURE, BLOOD (ROUTINE X 2)     Status: None   Collection Time    03/25/13  9:22 AM      Result Value Range Status   Specimen Description BLOOD LEFT HAND   Final   Special Requests BOTTLES DRAWN AEROBIC ONLY 3CC   Final   Culture  Setup Time 03/25/2013 20:46   Final   Culture     Final   Value:        BLOOD CULTURE RECEIVED NO GROWTH TO DATE CULTURE WILL BE HELD FOR 5 DAYS BEFORE ISSUING A FINAL NEGATIVE REPORT   Report Status PENDING   Incomplete  WOUND CULTURE     Status: None   Collection Time    03/26/13  1:39 PM      Result Value Range Status   Specimen Description WOUND LEFT FOOT   Final   Special Requests LEFT HEEL   Final   Gram Stain     Final   Value: NO WBC SEEN     NO SQUAMOUS EPITHELIAL CELLS SEEN     NO ORGANISMS SEEN   Culture ABUNDANT PSEUDOMONAS AERUGINOSA   Final   Report Status PENDING   Incomplete  SURGICAL PCR SCREEN     Status: None   Collection Time    03/27/13  1:25 AM      Result Value Range Status   MRSA, PCR NEGATIVE  NEGATIVE Final   Staphylococcus aureus NEGATIVE  NEGATIVE Final    Comment:            The Xpert SA Assay (FDA     approved for NASAL specimens     in patients over 28 years of age),     is one component of     a comprehensive surveillance     program.  Test performance has     been validated by [employer] for patients greater     than or equal to 19 year old.     It is not intended     to diagnose infection nor to     guide or monitor treatment.     Studies: No results found.  Scheduled Meds: . antiseptic oral rinse  15 mL Mouth Rinse BID  . atorvastatin  10 mg Oral QAC breakfast  . cinacalcet  60 mg Oral Q breakfast  . collagenase  1 application Topical QODAY  . darbepoetin (ARANESP) injection - DIALYSIS  100 mcg Intravenous Q Wed-HD  . docusate sodium  100 mg Oral Daily  . enoxaparin (LOVENOX) injection  30 mg Subcutaneous Q24H  . feeding supplement (NEPRO CARB STEADY)  237 mL Oral BID BM  . insulin aspart  0-9 Units Subcutaneous TID WC  . insulin glargine  6 Units Subcutaneous QHS  . midodrine  10 mg Oral 3 times weekly  . multivitamin  1 tablet Oral Q supper  . pantoprazole  40 mg Oral Daily  . sodium chloride  3 mL Intravenous Q12H   Continuous Infusions:   Active Problems:   HYPERLIPIDEMIA   End stage renal disease   Atherosclerosis of native arteries of the extremities with ulceration(440.23)   Cardiomyopathy, ischemic   Atherosclerotic PVD with ulceration   Acute osteomyelitis of foot    Time spent: 25    Tressa Maldonado, [facility]  [facility] Pager [phone]. If 7PM-7AM, please contact night-coverage at [email or web address], password [facility] 03/28/2013, 1:21 PM  LOS: 3 days

## 2013-03-28 NOTE — Procedures (Signed)
I was present at this session.  I have reviewed the session itself and made appropriate changes.  LUA  AVF, correcting orders. Discussed goals.  Kanai Hilger L 7/16/20148:28 AM

## 2013-03-28 NOTE — Progress Notes (Signed)
Subjective: Interval History: has complaints discussed wounds and infx.  Objective: Vital signs in last 24 hours: Temp:  [97.6 F (36.4 C)-98.7 F (37.1 C)] 98.1 F (36.7 C) (07/16 0637) Pulse Rate:  [90-108] 93 (07/16 0800) Resp:  [12-24] 18 (07/16 0800) BP: (82-120)/(53-71) 97/58 mmHg (07/16 0800) SpO2:  [92 %-100 %] 92 % (07/16 0637) Weight:  [83.4 kg (183 lb 13.8 oz)-84.324 kg (185 lb 14.4 oz)] 83.4 kg (183 lb 13.8 oz) (07/16 0637) Weight change: -8.276 kg (-18 lb 3.9 oz)  Intake/Output from previous day: 07/15 0701 - 07/16 0700 In: 645 [P.O.:120; I.V.:525] Out: 150 [Blood:150] Intake/Output this shift:    General appearance: alert, cooperative and moderately obese Resp: clear to auscultation bilaterally Breasts: not eval GI: obese , pos bs, liver down 5 cm Extremities: AVF RUA, AKA on L with vac on stump, wound L groin and above CV reg gr 2/6 M  1+ edema  Lab Results:  Recent Labs  03/27/13 0605 03/28/13 0500  WBC 11.1* 12.7*  HGB 10.3* 10.2*  HCT 32.7* 30.9*  PLT 172 174   BMET:  Recent Labs  03/27/13 0605 03/28/13 0500  NA 134* 133*  K 3.5 4.0  CL 93* 94*  CO2 30 28  GLUCOSE 88 106*  BUN 13 19  CREATININE 3.68* 4.50*  CALCIUM 9.4 9.2   No results found for this basename: PTH,  in the last 72 hours Iron Studies: No results found for this basename: IRON, TIBC, TRANSFERRIN, FERRITIN,  in the last 72 hours  Studies/Results: No results found.  I have reviewed the patient's current medications. Prior to Admission:  Prescriptions prior to admission  Medication Sig Dispense Refill  . acetaminophen (TYLENOL) 325 MG tablet Take 650 mg by mouth every 6 (six) hours as needed for pain.       [name] atorvastatin (LIPITOR) 10 MG tablet Take 10 mg by mouth daily before breakfast.       . B Complex-C (B-COMPLEX WITH VITAMIN C) tablet Take 1 tablet by mouth daily.      . cinacalcet (SENSIPAR) 60 MG tablet Take 60 mg by mouth daily before breakfast.       .  clopidogrel (PLAVIX) 75 MG tablet Take 1 tablet (75 mg total) by mouth daily.  30 tablet  0  . collagenase (SANTYL) ointment Apply 1 application topically every other day.      . ibuprofen (ADVIL,MOTRIN) 600 MG tablet Take 600 mg by mouth every 6 (six) hours as needed for pain.       [name] insulin aspart (NOVOLOG) 100 UNIT/ML injection Inject 3-16 Units into the skin 3 (three) times daily before meals. Sliding scale      . insulin glargine (LANTUS) 100 UNIT/ML injection Inject 12 Units into the skin at bedtime.       . midodrine (PROAMATINE) 10 MG tablet Take 10 mg by mouth 3 (three) times a week. Take on Monday, Wednesday, and Friday      . oxyCODONE (OXY IR/ROXICODONE) 5 MG immediate release tablet Take 1 tablet (5 mg total) by mouth every 8 (eight) hours as needed for pain.  30 tablet  0  . Propylene Glycol (SYSTANE BALANCE OP) Place 1 drop into both eyes as needed (dry eyes). Dry eyes      . sevelamer (RENVELA) 800 MG tablet Take 2,400 mg by mouth 3 (three) times daily with meals. 3 tabs by mouth three times a day      . tetrahydrozoline-zinc (VISINE-AC) 0.05-0.25 % ophthalmic  solution Place 2 drops into both eyes 3 (three) times daily as needed (for redness).      . cephALEXin (KEFLEX) 250 MG capsule Take 1 capsule (250 mg total) by mouth 2 (two) times daily.  28 capsule  0    Assessment/Plan: 1 CRF for HD, stable 2 Infx multiple organisms source wounds, on BSAB, stable 3 DM controlled 4 PVD per VVS 5 CAD stable 6 Anemia give epo 7 Obesity 8 Debill will need extensive rehab P HD, epo, AB, mobilize    LOS: 3 days   Gearldine Looney L 03/28/2013,8:29 AM

## 2013-03-28 NOTE — Progress Notes (Signed)
I met with patient at bedside. Await therapy evaluations to assist with planning rehab. Patient was in hemodialysis this morning. Patient in agreement to inpt rehab admission when medically ready. [phone]

## 2013-03-28 NOTE — Progress Notes (Addendum)
ANTIBIOTIC CONSULT NOTE-PROGRESS NOTE  Pharmacy Consult for : Vancomycin, Zosyn, s/p Zinacef [ x 2 doses post-op] Indication: Non-healing foot wound, wound culture pseudomonas, outpt blood cultures + moraxella and pseudomonas  Hospital Problems Active Problems:   HYPERLIPIDEMIA   End stage renal disease   Atherosclerosis of native arteries of the extremities with ulceration(440.23)   Cardiomyopathy, ischemic   Atherosclerotic PVD with ulceration   Acute osteomyelitis of foot  Weight: 83.4 kg  Vitals: BP 111/70  Pulse 99  Temp(Src) 97.8 F (36.6 C) (Oral)  Resp 18  Ht 5\' 9"  (1.753 m)  Wt 184 lb 1.4 oz (83.5 kg)  BMI 27.17 kg/m2  SpO2 92%  Labs:  Recent Labs  03/26/13 0430 03/27/13 0605 03/28/13 0500  WBC  --  11.1* 12.7*  HGB  --  10.3* 10.2*  PLT  --  172 174  CREATININE 5.22* 3.68* 4.50*   Estimated Creatinine Clearance: 15.7 ml/min (by C-G formula based on Cr of 4.5).   Microbiology: Recent Results (from the past 720 hour(s))  CULTURE, BLOOD (ROUTINE X 2)     Status: None   Collection Time    03/25/13  9:17 AM      Result Value Range Status   Specimen Description BLOOD LEFT ARM   Final   Special Requests BOTTLES DRAWN AEROBIC ONLY 10CC   Final   Culture  Setup Time 03/25/2013 20:46   Final   Culture     Final   Value:        BLOOD CULTURE RECEIVED NO GROWTH TO DATE CULTURE WILL BE HELD FOR 5 DAYS BEFORE ISSUING A FINAL NEGATIVE REPORT   Report Status PENDING   Incomplete  CULTURE, BLOOD (ROUTINE X 2)     Status: None   Collection Time    03/25/13  9:22 AM      Result Value Range Status   Specimen Description BLOOD LEFT HAND   Final   Special Requests BOTTLES DRAWN AEROBIC ONLY 3CC   Final   Culture  Setup Time 03/25/2013 20:46   Final   Culture     Final   Value:        BLOOD CULTURE RECEIVED NO GROWTH TO DATE CULTURE WILL BE HELD FOR 5 DAYS BEFORE ISSUING A FINAL NEGATIVE REPORT   Report Status PENDING   Incomplete  WOUND CULTURE     Status: None   Collection Time    03/26/13  1:39 PM      Result Value Range Status   Specimen Description WOUND LEFT FOOT   Final   Special Requests LEFT HEEL   Final   Gram Stain     Final   Value: NO WBC SEEN     NO SQUAMOUS EPITHELIAL CELLS SEEN     NO ORGANISMS SEEN   Culture ABUNDANT PSEUDOMONAS AERUGINOSA   Final   Report Status PENDING   Incomplete  SURGICAL PCR SCREEN     Status: None   Collection Time    03/27/13  1:25 AM      Result Value Range Status   MRSA, PCR NEGATIVE  NEGATIVE Final   Staphylococcus aureus NEGATIVE  NEGATIVE Final    Anti-infectives Anti-infectives   Start     Dose/Rate Route Frequency Ordered Stop   03/27/13 1200  cefUROXime (ZINACEF) 1.5 g in dextrose 5 % 50 mL IVPB     1.5 g 100 mL/hr over 30 Minutes Intravenous Every 12 hours 03/27/13 1044 03/28/13 0057   03/26/13 1200  vancomycin (  VANCOCIN) IVPB 1000 mg/200 mL premix     1,000 mg 200 mL/hr over 60 Minutes Intravenous Every M-W-F (Hemodialysis) 03/25/13 1652     03/25/13 1800  vancomycin (VANCOCIN) 500 mg in sodium chloride 0.9 % 100 mL IVPB     500 mg 100 mL/hr over 60 Minutes Intravenous  Once 03/25/13 1652 03/25/13 1858   03/25/13 1400  piperacillin-tazobactam (ZOSYN) IVPB 2.25 g     2.25 g 100 mL/hr over 30 Minutes Intravenous 3 times per day 03/25/13 1325       Assessment:  Day # 4 of inpatient Vancomycin and Zosyn for non-healing foot ulcer, s/p Zinacef x 2 doses post-op L-TKA [03/27/13].  POD # 1 s/p L-AKA,  Afebrile, WBC 12.7.  Wound culture + for pseudomonas, outpt HD blood cultures + moraxella and pseudomonas  Continues on HD MWF.  Goal of Therapy:   Pre-HD vancomycin levels 15 - 25 mcg/ml Antibiotics selected for infection/cultures and adjusted for renal function  Plan:   Continue Vancomycin and Zosyn empirically post-op, same dose schedules.  Laurena Bering, Pharm.D.  03/28/2013 11:03 AM    ADDENDUM:  03/28/2013, 2:46 PM    Assessment:  Bacteremia with Morganella  morganii and Pseudomonas from 7/9 blood cultures.  The patient was placed on Vancomycin and Zosyn but cultures report that pseudomonas is resistant to Zosyn.  Treatment is changed to Gentamicin for 14 days, last dose expected 7/29.   IBW 66 kg, ABW 71 kg, est Vd 0.4 L/kg. [28.4 L]  GOAL:   Gentamicin peaks ~ 6 - 8 mcg/ml, Troughs < 2 mcg/ml   PLAN: 1. Gentamicin 200 mg IV Load. 2. Pre - HD Gentamicin level prior to next HD session, 03/30/13. 3. Follow up cultures, clinical course, dialysis schedule, and redose/adjust as clinically indicated.   Dorianne Perret, Elisha Headland,  Pharm.D.,   03/28/2013, 2:57 PM

## 2013-03-28 NOTE — Evaluation (Signed)
Physical Therapy Evaluation Patient Details Name: [name] MRN: [id number] DOB: October 27, 1955 Today's Date: 03/28/2013 Time: 4098-1191 PT Time Calculation (min): 21 min  PT Assessment / Plan / Recommendation History of Present Illness     Clinical Impression  Patient is s/p L AKA surgery resulting in functional limitations due to the deficits listed below (see PT Problem List). Pt also with hx of R BKA.  Patient will benefit from skilled PT to increase their independence and safety with mobility to allow discharge to the venue listed below.       PT Assessment  Patient needs continued PT services    Follow Up Recommendations  CIR    Does the patient have the potential to tolerate intense rehabilitation      Barriers to Discharge        Equipment Recommendations  None recommended by PT    Recommendations for Other Services     Frequency Min 3X/week    Precautions / Restrictions Precautions Precautions: Fall Precaution Comments: new L AKA, old R BKA   Pertinent Vitals/Pain Pt reports increased L leg pain at amputation site, wound vac in place, elevated L LE with pillow, pt aware to call for pain meds (states received 2 hrs ago)      Mobility  Bed Mobility Bed Mobility: Supine to Sit Supine to Sit: 4: Min assist Details for Bed Mobility Assistance: verbal cues for technique, assist for L hip scoot to EOB, increased time Transfers Transfers: Lateral/Scoot Transfers Lateral/Scoot Transfers: 3: Mod assist;From elevated surface Details for Transfer Assistance: transferred from slightly elevated bed to chair (dropped arm), pt required assist for scooting due to generalized weakness, pt also states UEs are very weak, donned R prosthesis EOB with increased time to attempt standing however pt too weak thus lateral/scoot to recliner Ambulation/Gait Ambulation/Gait Assistance: Not tested (comment)    Exercises     PT Diagnosis: Generalized weakness  PT Problem List:  Decreased strength;Decreased activity tolerance;Decreased mobility;Pain PT Treatment Interventions: DME instruction;Functional mobility training;Therapeutic activities;Therapeutic exercise;Wheelchair mobility training;Patient/family education;Balance training     PT Goals(Current goals can be found in the care plan section) Acute Rehab PT Goals PT Goal Formulation: With patient Time For Goal Achievement: 04/11/13 Potential to Achieve Goals: Good  Visit Information  Last PT Received On: 03/28/13 Assistance Needed: +2 (if standing)       Prior Functioning  Home Living Family/patient expects to be discharged to:: Inpatient rehab Prior Function Level of Independence: Independent with assistive device(s) Comments: Pt states she used 4 wheeled walker to ambulate short distances and manual wheelchair for longer distances Communication Communication: No difficulties    Cognition  Cognition Arousal/Alertness: Awake/alert Behavior During Therapy: WFL for tasks assessed/performed Overall Cognitive Status: Within Functional Limits for tasks assessed    Extremity/Trunk Assessment Upper Extremity Assessment Upper Extremity Assessment: Generalized weakness Lower Extremity Assessment Lower Extremity Assessment: Generalized weakness;LLE deficits/detail LLE Deficits / Details: assist required for hip flexion, unable to lift against gravity, reports increased pain with movement, wound vac in place   Balance    End of Session PT - End of Session Equipment Utilized During Treatment: Gait belt Activity Tolerance: Patient limited by fatigue;Patient limited by pain Patient left: in chair;with call bell/phone within reach Nurse Communication: Mobility status;Need for lift equipment  GP     Neno Hohensee,KATHrine E 03/28/2013, 3:21 PM Zenovia Jarred, PT, DPT 03/28/2013 Pager: [phone]

## 2013-03-28 NOTE — Progress Notes (Signed)
INITIAL NUTRITION ASSESSMENT  DOCUMENTATION CODES Per approved criteria  -Obesity Unspecified   INTERVENTION: 1. High Protein diet education provided.  2. Nepro Shake po BID, each supplement provides 425 kcal and 19 grams protein.   NUTRITION DIAGNOSIS: Increased protein needs related to wound healing and HD as evidenced by estimated nutrition needs.   Goal: PO intake to meet >/=90% estimated nutrition needs  Monitor:  PO intake, weight trends, labs  Reason for Assessment: Consult  57 y.o. female  Admitting Dx: Weakness and fall   ASSESSMENT: Pt was brought to ED post mechanical fall on 7/13. Has a chronic PU on heel and was evaluated in ED showing osteomyelitis. Admitted for treatment. Now s/p L AKA. Has hx of ESRD on HD, with R BKA.  RD consulted for protein intake and wound healing. Pt reports that she tries to follow a renal diet at home. Has a poor appetite and was not eating well previously. Was drinking Boost shakes. Encouraged pt to try Nepro (higher in kcal/protein) then Boost and renal friendly. Provided pt with education materials for increased protein intake and renal friendly high protein supplements.   Height: Ht Readings from Last 1 Encounters:  03/27/13 5\' 9"  (1.753 m)    Weight: Wt Readings from Last 1 Encounters:  03/28/13 184 lb 1.4 oz (83.5 kg)    Ideal Body Weight: 118 lbs Adj for BKA and AKA  % Ideal Body Weight: 156%  Wt Readings from Last 10 Encounters:  03/28/13 184 lb 1.4 oz (83.5 kg)  03/28/13 184 lb 1.4 oz (83.5 kg)  03/07/13 208 lb 9.6 oz (94.62 kg)  02/23/13 207 lb 7.3 oz (94.1 kg)  02/23/13 207 lb 7.3 oz (94.1 kg)  02/19/13 201 lb 11.5 oz (91.5 kg)  02/12/13 202 lb 9.6 oz (91.9 kg)  02/03/13 208 lb 8.9 oz (94.6 kg)  01/31/13 210 lb (95.255 kg)  01/25/13 210 lb (95.255 kg)  Was 203 lbs on admission   Usual Body Weight: 208 lbs prior to AKA  % Usual Body Weight: 98%  BMI:  33.8 kg/(m^2) Adj for AKA and BKA, obesity class 1    Estimated Nutritional Needs: Kcal: 2400-2600 Protein: 120-130 gm  Fluid: 1.2 L   Skin: BKA, new AKA on L leg, wound vac in place   Diet Order: Renal  EDUCATION NEEDS: -Education needs addressed   Intake/Output Summary (Last 24 hours) at 03/28/13 1138 Last data filed at 03/28/13 1041  Gross per 24 hour  Intake    120 ml  Output    -25 ml  Net    145 ml    Last BM: PTA    Labs:   Recent Labs Lab 03/25/13 0916 03/26/13 0430 03/27/13 0605 03/28/13 0500  NA 133* 134* 134* 133*  K 3.8 4.6 3.5 4.0  CL 91* 93* 93* 94*  CO2 31 31 30 28   BUN 23 27* 13 19  CREATININE 4.27* 5.22* 3.68* 4.50*  CALCIUM 9.9 9.7 9.4 9.2  MG 2.2  --   --   --   PHOS 2.9  --   --   --   GLUCOSE 125* 86 88 106*    CBG (last 3)   Recent Labs  03/27/13 1624 03/27/13 2104 03/28/13 0315  GLUCAP 97 125* 111*    Scheduled Meds: . antiseptic oral rinse  15 mL Mouth Rinse BID  . atorvastatin  10 mg Oral QAC breakfast  . cinacalcet  60 mg Oral Q breakfast  . collagenase  1  application Topical QODAY  . darbepoetin (ARANESP) injection - DIALYSIS  100 mcg Intravenous Q Wed-HD  . docusate sodium  100 mg Oral Daily  . enoxaparin (LOVENOX) injection  30 mg Subcutaneous Q24H  . insulin aspart  0-9 Units Subcutaneous TID WC  . insulin glargine  6 Units Subcutaneous QHS  . midodrine  10 mg Oral 3 times weekly  . multivitamin  1 tablet Oral Q supper  . pantoprazole  40 mg Oral Daily  . piperacillin-tazobactam (ZOSYN)  IV  2.25 g Intravenous Q8H  . sodium chloride  3 mL Intravenous Q12H  . vancomycin  1,000 mg Intravenous Q M,W,F-HD    Continuous Infusions:   Past Medical History  Diagnosis Date  . Diabetes mellitus, type 2   . Secondary hyperparathyroidism   . OSA (obstructive sleep apnea)     NPSG 11.23.11-AHI 64.9/hr-central and obstructive apnea  . Ischemic cardiomyopathy     EF 25-30% 2011  . CAD (coronary artery disease)   . Pulmonary hypertension     Est PAsyst 60-Dr Mendel Ryder,  off coumadin, CT neg for PE  . Hyperlipidemia   . Anemia   . Thyroid disease   . Pulmonary edema   . Peripheral vascular disease   . Sebaceous cyst   . 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 Velvet Bathe  . Irregular heart beat   . Shortness of breath     with exertion  . Hemodialysis patient     M,W,F  . TIA (transient ischemic attack) June 2014  . Ulcer of heel due to diabetes     left  . Kidney disease Dialysis MWF    ESRD secondary to DM, failed transplant 2006. Dialysis    Past Surgical History  Procedure Laterality Date  . Coronary artery bypass graft  2011    3V  . Insertion of dialysis catheter    . 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 Nasal mucosal biopsy    . Ovarian cyst surgery    . 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 Laser retinal surgery  2011  . Kidney transplant      rejection  . Abdominal angiogram Left 01/25/13  . Colonoscopy    . Cardiac catheterization    . Eye surgery    . Femoral-popliteal bypass graft Left 02/20/2013    Procedure:  FEMORAL-to below knee POPLITEAL ARTERY bypasswith saphenous vein with intraoperative angiogram ;  Surgeon: Chuck Hint, MD;  Location: [facility] OR;  Service: Vascular;  Laterality: Left;  fem-below knee pop    Clarene Duke RD, LDN Pager [phone] After Hours pager [phone]

## 2013-03-28 NOTE — Progress Notes (Signed)
VASCULAR PROGRESS NOTE  SUBJECTIVE: Pain adequately controlled.   PHYSICAL EXAM: Filed Vitals:   03/28/13 0355 03/28/13 0637 03/28/13 0654 03/28/13 0702  BP: 107/62 99/60 98/59  105/64  Pulse: 90 94 92 92  Temp: 98.6 F (37 C) 98.1 F (36.7 C)    TempSrc: Oral Oral    Resp: 18 16 24 18   Height:      Weight:  183 lb 13.8 oz (83.4 kg)    SpO2: 94% 92%     VAC with good seal on Left AKA  LABS: Lab Results  Component Value Date   WBC 11.1* 03/27/2013   HGB 10.3* 03/27/2013   HCT 32.7* 03/27/2013   MCV 85.2 03/27/2013   PLT 172 03/27/2013   Lab Results  Component Value Date   CREATININE 3.68* 03/27/2013   Lab Results  Component Value Date   INR 1.29 02/19/2013   CBG (last 3)   Recent Labs  03/27/13 1624 03/27/13 2104 03/28/13 0315  GLUCAP 97 125* 111*    Active Problems:   HYPERLIPIDEMIA   End stage renal disease   Atherosclerosis of native arteries of the extremities with ulceration(440.23)   Cardiomyopathy, ischemic   Atherosclerotic PVD with ulceration   Acute osteomyelitis of foot  ASSESSMENT AND PLAN:  * 1 Day Post-Op s/p: Left AKA   *  ID: on IV Vanco and Zinacef pending culture results. From my standpoint would only need to continue IV ABx 48 hours post op.   * DVT prophylaxis: Lovenox  * Nutrition is critical, I have consulted the Nutrition team.   * Probably remove incisional [facility] Friday  Cari Caraway Beeper: [phone] 03/28/2013

## 2013-03-29 ENCOUNTER — Encounter (HOSPITAL_COMMUNITY): Payer: Self-pay | Admitting: Vascular Surgery

## 2013-03-29 LAB — BASIC METABOLIC PANEL
Chloride: 95 mEq/L — ABNORMAL LOW (ref 96–112)
GFR calc Af Amer: 16 mL/min — ABNORMAL LOW (ref >90)
Potassium: 4.2 mEq/L (ref 3.5–5.1)

## 2013-03-29 LAB — CBC
HCT: 33.3 % — ABNORMAL LOW (ref 36.0–46.0)
Hemoglobin: 10.9 g/dL — ABNORMAL LOW (ref 12.0–15.0)
RBC: 3.91 MIL/uL (ref 3.87–5.11)
WBC: 14.6 10*3/uL — ABNORMAL HIGH (ref 4.0–10.5)

## 2013-03-29 LAB — GLUCOSE, CAPILLARY: Glucose-Capillary: 139 mg/dL — ABNORMAL HIGH (ref 70–99)

## 2013-03-29 NOTE — PMR Pre-admission (Signed)
PMR Admission Coordinator Pre-Admission Assessment  Patient: [name] is an 57 y.o., female MRN: [id number] DOB: 08-21-1956 Height: 5\' 9"  (175.3 cm) Weight: 84 kg (185 lb 3 oz)              Insurance Information HMO:     PPO:      PCP:      IPA:      80/20: yes     OTHER: no HMO PRIMARY: Medicare A and B      Policy#: [id number]      Subscriber: pt  Medicaid Application Date:       Case Manager:   Emergency Contact Information Contact Information   Name Relation Home Work Mobile   [place] Brother   [phone]   Orabelle, Rylee [phone]       Current Medical History  Patient Admitting Diagnosis:left AKA (prior right BKA with prosthesis)  History of Present Illness: [name] is a 57 y.o. female with history of SLE, DM type 2, ESRD--failed transplant, PVD-s/p R BKA with chronic ulcers left heel and and left thigh wound s/p L-FPBG. She's well known to rehab from prior stays past R-BKA and CABG. She was admitted on 07/013/14 past Fall and reports of 1-2 week history of weakness. Left heel ulcer with necrotic material and foul odor and xrays done revealed osteomyelitis with bone avulsion. She was started on IV antibiotics and vascular felt that foot was not salvageable. On 07/15, patient underwent L-AKA by Dr. Edilia Bo.  Patient was independent with use of prosthesis and used to drive to dialysis PTA.   Past Medical History  Past Medical History  Diagnosis Date  . Diabetes mellitus, type 2   . Secondary hyperparathyroidism   . OSA (obstructive sleep apnea)     NPSG 11.23.11-AHI 64.9/hr-central and obstructive apnea  . Ischemic cardiomyopathy     EF 25-30% 2011  . CAD (coronary artery disease)   . Pulmonary hypertension     Est PAsyst 60-Dr Mendel Ryder, off coumadin, CT neg for PE  . Hyperlipidemia   . Anemia   . Thyroid disease   . Pulmonary edema   . Peripheral vascular disease   . Sebaceous cyst   . 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 Velvet Bathe  .  Irregular heart beat   . Shortness of breath     with exertion  . Hemodialysis patient     M,W,F  . TIA (transient ischemic attack) June 2014  . Ulcer of heel due to diabetes     left  . Kidney disease Dialysis MWF    ESRD secondary to DM, failed transplant 2006. Dialysis    Family History  family history includes Cerebral aneurysm in her mother; Edema in her father; Heart disease in her brother and unspecified family member; and Stroke in her brother.  Prior Rehab/Hospitalizations: [facility] 03/2010 After CABG  Current Medications  Current facility-administered medications:acetaminophen (TYLENOL) suppository 325-650 mg, 325-650 mg, Rectal, Q4H PRN, Chuck Hint, MD;  acetaminophen (TYLENOL) tablet 325-650 mg, 325-650 mg, Oral, Q4H PRN, Chuck Hint, MD;  alum & mag hydroxide-simeth (MAALOX/MYLANTA) 200-200-20 MG/5ML suspension 15-30 mL, 15-30 mL, Oral, Q2H PRN, Chuck Hint, MD antiseptic oral rinse (BIOTENE) solution 15 mL, 15 mL, Mouth Rinse, BID, Catarina Hartshorn, MD, 15 mL at 03/29/13 2000;  atorvastatin (LIPITOR) tablet 10 mg, 10 mg, Oral, QAC breakfast, Catarina Hartshorn, MD, 10 mg at 03/30/13 0640;  cinacalcet (SENSIPAR) tablet 60 mg, 60 mg, Oral, Q breakfast, Catarina Hartshorn, MD,  60 mg at 03/29/13 1045;  collagenase (SANTYL) ointment 1 application, 1 application, Topical, QODAY, Catarina Hartshorn, MD, 1 application at 03/26/13 1130 darbepoetin (ARANESP) injection 100 mcg, 100 mcg, Intravenous, Q Wed-HD, Trevor Iha, MD, 100 mcg at 03/28/13 1010;  docusate sodium (COLACE) capsule 100 mg, 100 mg, Oral, Daily, Chuck Hint, MD, 100 mg at 03/29/13 1045;  enoxaparin (LOVENOX) injection 30 mg, 30 mg, Subcutaneous, Q24H, Chuck Hint, MD, 30 mg at 03/29/13 2229 feeding supplement (NEPRO CARB STEADY) liquid 237 mL, 237 mL, Oral, BID BM, Tonye Becket, RD, 237 mL at 03/29/13 1405;  fentaNYL (SUBLIMAZE) injection 25-50 mcg, 25-50 mcg, Intravenous, Q5 min PRN, Raiford Simmonds,  MD;  guaiFENesin-dextromethorphan (ROBITUSSIN DM) 100-10 MG/5ML syrup 15 mL, 15 mL, Oral, Q4H PRN, Chuck Hint, MD;  hydrALAZINE (APRESOLINE) injection 10 mg, 10 mg, Intravenous, Q2H PRN, Chuck Hint, MD insulin aspart (novoLOG) injection 0-9 Units, 0-9 Units, Subcutaneous, TID WC, Catarina Hartshorn, MD, 1 Units at 03/29/13 1200;  insulin glargine (LANTUS) injection 6 Units, 6 Units, Subcutaneous, QHS, Catarina Hartshorn, MD, 6 Units at 03/29/13 2229;  labetalol (NORMODYNE,TRANDATE) injection 10 mg, 10 mg, Intravenous, Q2H PRN, Chuck Hint, MD;  metoprolol (LOPRESSOR) injection 2-5 mg, 2-5 mg, Intravenous, Q2H PRN, Chuck Hint, MD midodrine (PROAMATINE) tablet 10 mg, 10 mg, Oral, 3 times weekly, Catarina Hartshorn, MD, 10 mg at 03/30/13 1914;  morphine 2 MG/ML injection 2 mg, 2 mg, Intravenous, Q2H PRN, Amelia Jo Roczniak, PA-C, 2 mg at 03/28/13 1233;  multivitamin (RENA-VIT) tablet 1 tablet, 1 tablet, Oral, Q supper, Kela Millin, MD, 1 tablet at 03/29/13 1826;  naphazoline (NAPHCON) 0.1 % ophthalmic solution 1 drop, 1 drop, Both Eyes, QID PRN, Catarina Hartshorn, MD ondansetron (ZOFRAN) injection 4 mg, 4 mg, Intravenous, Q6H PRN, Catarina Hartshorn, MD;  ondansetron (ZOFRAN) injection 4 mg, 4 mg, Intravenous, Q6H PRN, Raiford Simmonds, MD;  ondansetron (ZOFRAN) injection 4 mg, 4 mg, Intravenous, Q6H PRN, Chuck Hint, MD;  ondansetron [facility]) tablet 4 mg, 4 mg, Oral, Q6H PRN, Catarina Hartshorn, MD;  oxyCODONE (Oxy IR/ROXICODONE) immediate release tablet 5 mg, 5 mg, Oral, Q6H PRN, Catarina Hartshorn, MD, 5 mg at 03/27/13 1050 oxyCODONE (Oxy IR/ROXICODONE) immediate release tablet 5-10 mg, 5-10 mg, Oral, Q4H PRN, Regina J Roczniak, PA-C, 10 mg at 03/29/13 1827;  pantoprazole (PROTONIX) EC tablet 40 mg, 40 mg, Oral, Daily, Chuck Hint, MD, 40 mg at 03/29/13 1045;  phenol (CHLORASEPTIC) mouth spray 1 spray, 1 spray, Mouth/Throat, PRN, Chuck Hint, MD;  sodium chloride 0.9 % injection 3 mL, 3 mL,  Intravenous, Q12H, Catarina Hartshorn, MD, 3 mL at 03/29/13 2230  Patients Current Diet: Renal  Precautions / Restrictions Precautions Precautions: Fall Precaution Comments: new L AKA, old R BKA, wound vac on LLE Restrictions Weight Bearing Restrictions: Yes LLE Weight Bearing: Non weight bearing   Prior Activity Level Community (5-7x/wk): drove self to op dialysis 3 x week pta  Home Assistive Devices / Equipment Home Assistive Devices/Equipment: Prosthesis;Wheelchair  Prior Functional Level Prior Function Level of Independence: Independent with assistive device(s) Comments: Pt states she used 4 wheeled walker to ambulate short distances and manual wheelchair for longer distances  Current Functional Level Cognition  Overall Cognitive Status: Within Functional Limits for tasks assessed Orientation Level: Oriented X4    Extremity Assessment (includes Sensation/Coordination)  Upper Extremity Assessment: Generalized weakness  Lower Extremity Assessment: Generalized weakness;LLE deficits/detail LLE Deficits / Details: assist required for hip flexion, unable to lift against  gravity, reports increased pain with movement, wound vac in place    ADLs  Eating/Feeding: Simulated;Independent Where Assessed - Eating/Feeding: Bed level Grooming: Simulated;Set up;Supervision/safety Where Assessed - Grooming: Unsupported sitting Upper Body Bathing: Simulated;Set up Where Assessed - Upper Body Bathing: Unsupported sitting Lower Body Bathing: Simulated;Moderate assistance Where Assessed - Lower Body Bathing: Unsupported sitting;Lean right and/or left Upper Body Dressing: Simulated;Set up;Supervision/safety Where Assessed - Upper Body Dressing: Unsupported sitting Lower Body Dressing: Simulated;Maximal assistance Where Assessed - Lower Body Dressing: Lean right and/or left;Unsupported sitting Toilet Transfer: Simulated;+2 Total assistance Toilet Transfer: Patient Percentage: 30% Toilet Transfer  Method:  (squat scoot with use of bed pad to pt's right) Toilet Transfer Equipment:  (Bed to recliner built up to height of arm) Toileting - Clothing Manipulation and Hygiene: Simulated;Maximal assistance Where Assessed - Engineer, mining and Hygiene: Lean right and/or left Equipment Used:  (Bed pad) Transfers/Ambulation Related to ADLs: Attempted to use [place] Dec Plus for sit to stand from raised bed with +1 (unable) then +2 (pt=50%) and not able to achieve full upright stance (3/4) for 20 seconds. Did not attempt again due to did not want to use up pt's enegry trying this when then she would have less energy to A with moving to recliner    Mobility  Bed Mobility: Supine to Sit;Sitting - Scoot to Edge of Bed Supine to Sit: With rails;HOB elevated;4: Min assist Sitting - Scoot to [place] of Bed: With rail;2: Max assist    Transfers  Transfers: Lateral/Scoot Transfers Lateral/Scoot Transfers: 3: Mod assist;From elevated surface    Ambulation / Gait / Stairs / Psychologist, prison and probation services  Ambulation/Gait Ambulation/Gait Assistance: Not tested (comment)    Posture / Balance Static Sitting Balance Static Sitting - Balance Support: Feet unsupported (alternating RUE and LUE) Static Sitting - Level of Assistance:  (min guard A) Static Sitting - Comment/# of Minutes: 5 minutes    Special needs/care consideration Dialysis op [address]        Days MWF Patient drove herself pta Wound Vac (area) may be d/c'd Location l aka stump Bowel mgmt: no BM per pt since admit Bladder mgmt:anuric    Previous Home Environment Living Arrangements: Alone Available Help at Discharge: Family Type of Home: Apartment Home Layout: One level Home Access: Ramped entrance Bathroom Shower/Tub: Health visitor: Handicapped height Bathroom Accessibility: Yes How Accessible: Accessible via wheelchair;Accessible via walker Home Care Services: Yes Type of Home Care Services: Home PT;Home RN Home  Care Agency (if known): [facility] Additional Comments: home is handicap accessible  Discharge Living Setting Plans for Discharge Living Setting: Lives with (comment);Other (Comment) (plans to d/c to Brother's home at d/c) Type of Home at Discharge: House Discharge Home Layout: Two level;Able to live on main level with bedroom/bathroom Discharge Home Access: Ramped entrance Discharge Bathroom Shower/Tub: Tub/shower unit Discharge Bathroom Toilet: Handicapped height Discharge Bathroom Accessibility: Yes How Accessible: Accessible via wheelchair;Accessible via walker Does the patient have any problems obtaining your medications?: No  Social/Family/Support Systems Contact Information: [place] and Mayara Paulson, brother and sister in law Anticipated Caregiver: Brother and his family Anticipated Industrial/product designer Information: see above Ability/Limitations of Caregiver: none Caregiver Availability: 24/7 Discharge Plan Discussed with Primary Caregiver: Yes Is Caregiver In Agreement with Plan?: Yes Does Caregiver/Family have Issues with Lodging/Transportation while Pt is in Rehab?: No    Goals/Additional Needs Patient/Family Goal for Rehab: Mod I to supervision with PT; Mod i to set up OT at wheelchair level Expected length of stay: ELOS 1  to 2 weeks Dietary Needs: renal diet Special Service Needs: hemodialysis MWF as outpt Rudene Anda Additional Information: Patient has RLE prosthesis Pt/Family Agrees to Admission and willing to participate: Yes Program Orientation Provided & Reviewed with Pt/Caregiver Including Roles  & Responsibilities: Yes   Decrease burden of Care through IP rehab admission: n/a  Possible need for SNF placement upon discharge: n/a  Patient Condition: This patient's medical and functional status has changed since the consult dated: 03/27/13 in which the Rehabilitation Physician determined and documented that the patient's condition is appropriate for  intensive rehabilitative care in an inpatient rehabilitation facility. Medical changes are: wound VAC to be d/c's within next 24 hrs and tolerating up with therapy.  Functional changes are: overall max assist transfer with RLE prosthesis . After evaluating the patient today and speaking with the Rehabilitation physician and acute team, the patient remains appropriate for inpatient rehab. Will admit to inpatient rehab today.  Preadmission Screen Completed By:  Clois Dupes, 03/30/2013 10:10 AM ______________________________________________________________________   Discussed status with Dr. Riley Kill on 03/30/13 at  1009 and received telephone approval for admission today.  Admission Coordinator:  Clois Dupes, time 1009 Date 03/30/13.

## 2013-03-29 NOTE — Evaluation (Signed)
Occupational Therapy Evaluation Patient Details Name: [name] MRN: [id number] DOB: [birth year] Today's Date: 03/29/2013 Time: 1104-1140 OT Time Calculation (min): 36 min  OT Assessment / Plan / Recommendation History of present illness 57 year old female with a history of ischemic cardiomyopathy (EF 45-50%), ESRD, diabetes mellitus, peripheral vascular disease presents with one to two-week history of generalized weakness. The patient states that she was trying to transfer to her wheelchair early this morning and fell onto the floor during the transfer. She did not  injure herself. Upon evaluation in ED, the patient was found to have a left heel ulcer that had necrotic material and foul odor. X-ray of the left heel suggested osteomyelitis with a portion of bone avulsion. The patient denied any fevers, chills, chest discomfort, shortness of breath, vomiting, diarrhea, abdominal pain. 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 The patient states that the ulcer on her left calcaneal area started in May 2014 and gradually got worse. She underwent a left femoral-popliteal bypass performed by Dr. Edilia Bo on 02/20/2013. The left calcaneal wound has not significantly improved since that period of time. This past week, the patient was referred to go to hyperbaric oxygen therapy. Patient believes that she may also be receiving some type of IV antibiotics on dialysis on her last 2 dialysis sessions. She does not know the name of the antibiotic. In addition, the patient has been taking cephalexin 250 mg twice a day since discharge from the hospital from her femoral-popliteal bypass. She receives dialysis through a right upper extremity fistula on Monday, Wednesday, Fridays.   Clinical Impression   Pt admitted with above and underwent a LLE AKA. Pt currently with functional limitations due to the deficits listed below (see OT Problem List).  Pt will benefit from skilled OT to  increase their safety and independence with ADL and functional mobility for ADL to facilitate discharge to venue listed below.       OT Assessment  Patient needs continued OT Services    Follow Up Recommendations  CIR       Equipment Recommendations   (TBD at next venue)       Frequency  Min 2X/week    Precautions / Restrictions Precautions Precautions: Fall Precaution Comments: new L AKA, old R BKA, wound vac on LLE Restrictions Weight Bearing Restrictions: Yes LLE Weight Bearing: Non weight bearing       ADL  Eating/Feeding: Simulated;Independent Where Assessed - Eating/Feeding: Bed level Grooming: Simulated;Set up;Supervision/safety Where Assessed - Grooming: Unsupported sitting Upper Body Bathing: Simulated;Set up Where Assessed - Upper Body Bathing: Unsupported sitting Lower Body Bathing: Simulated;Moderate assistance Where Assessed - Lower Body Bathing: Unsupported sitting;Lean right and/or left Upper Body Dressing: Simulated;Set up;Supervision/safety Where Assessed - Upper Body Dressing: Unsupported sitting Lower Body Dressing: Simulated;Maximal assistance Where Assessed - Lower Body Dressing: Lean right and/or left;Unsupported sitting Toilet Transfer: Simulated;+2 Total assistance Toilet Transfer: Patient Percentage: 30% Toilet Transfer Method:  (squat scoot with use of bed pad to pt's right) Toilet Transfer Equipment:  (Bed to recliner built up to height of arm) Toileting - Clothing Manipulation and Hygiene: Simulated;Maximal assistance Where Assessed - Engineer, mining and Hygiene: Lean right and/or left Equipment Used:  (Bed pad) Transfers/Ambulation Related to ADLs: Attempted to use [place] Dec Plus for sit to stand from raised bed with +1 (unable) then +2 (pt=50%) and not able to achieve full upright stance (3/4) for 20 seconds. Did not attempt again due to did not want to use  up pt's enegry trying this when then she would have less energy to A with  moving to recliner    OT Diagnosis: Generalized weakness  OT Problem List: Decreased strength;Decreased activity tolerance;Impaired balance (sitting and/or standing);Decreased knowledge of use of DME or AE;Decreased knowledge of precautions OT Treatment Interventions: Self-care/ADL training;Balance training;Therapeutic exercise;Therapeutic activities;Patient/family education;DME and/or AE instruction   OT Goals(Current goals can be found in the care plan section) Acute Rehab OT Goals Patient Stated Goal: To go to rehab  OT Goal Formulation: With patient Time For Goal Achievement: 04/05/13 Potential to Achieve Goals: Good ADL Goals Pt Will Perform Grooming: with set-up;with supervision;sitting (unsupported) Pt Will Transfer to Toilet: with mod assist;with transfer board;anterior/posterior transfer;bedside commode Pt/caregiver will Perform Home Exercise Program: For increased strengthening;Independently (with theraband level 1) Additional ADL Goal #1: Pt will be able to come up to sit at EOB with S with HOB up and use of rail  Visit Information  Last OT Received On: 03/29/13 Assistance Needed: +2 (for transfers) History of Present Illness: 57 year old female with a history of ischemic cardiomyopathy (EF 45-50%), ESRD, diabetes mellitus, peripheral vascular disease presents with one to two-week history of generalized weakness. The patient states that she was trying to transfer to her wheelchair early this morning and fell onto the floor during the transfer. She did not  injure herself. Upon evaluation in ED, the patient was found to have a left heel ulcer that had necrotic material and foul odor. X-ray of the left heel suggested osteomyelitis with a portion of bone avulsion. The patient denied any fevers, chills, chest discomfort, shortness of breath, vomiting, diarrhea, abdominal pain. 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 The patient states that the ulcer on  her left calcaneal area started in May 2014 and gradually got worse. She underwent a left femoral-popliteal bypass performed by Dr. Edilia Bo on 02/20/2013. The left calcaneal wound has not significantly improved since that period of time. This past week, the patient was referred to go to hyperbaric oxygen therapy. Patient believes that she may also be receiving some type of IV antibiotics on dialysis on her last 2 dialysis sessions. She does not know the name of the antibiotic. In addition, the patient has been taking cephalexin 250 mg twice a day since discharge from the hospital from her femoral-popliteal bypass. She receives dialysis through a right upper extremity fistula on Monday, Wednesday, Fridays.       Prior Functioning     Home Living Family/patient expects to be discharged to:: Inpatient rehab Living Arrangements: Alone Available Help at Discharge: Family Type of Home: Apartment Home Access: Ramped entrance Home Layout: One level Additional Comments: home is handicap accessible Prior Function Level of Independence: Independent with assistive device(s) Comments: Pt states she used 4 wheeled walker to ambulate short distances and manual wheelchair for longer distances Communication Communication: No difficulties Dominant Hand: Right         Vision/Perception Vision - History Patient Visual Report: No change from baseline   Cognition  Cognition Arousal/Alertness: Awake/alert Behavior During Therapy: WFL for tasks assessed/performed Overall Cognitive Status: Within Functional Limits for tasks assessed    Extremity/Trunk Assessment Upper Extremity Assessment Upper Extremity Assessment: Generalized weakness     Mobility Bed Mobility Bed Mobility: Supine to Sit;Sitting - Scoot to Edge of Bed Supine to Sit: With rails;HOB elevated;4: Min assist Sitting - Scoot to [place] of Bed: With rail;2: Max Engineer, maintenance  Sitting Balance Static Sitting - Balance  Support: Feet unsupported (alternating RUE and LUE) Static Sitting - Level of Assistance:  (min guard A) Static Sitting - Comment/# of Minutes: 5 minutes   End of Session OT - End of Session Activity Tolerance: Patient limited by fatigue Patient left: in chair;with call bell/phone within reach Nurse Communication:  (NT in room to A to recliner with bed pad)       Evette Georges [phone] 03/29/2013, 11:55 AM

## 2013-03-29 NOTE — Progress Notes (Signed)
Met with pt again at bedside. Patient does have a r prosthesis and P.T. Used in therapy session yesterday. It is at bedside also. I anticipate admission to inpt rehab tomorrow. Patient is in agreement. I will follow in the morning. [phone]

## 2013-03-29 NOTE — Progress Notes (Addendum)
[facility]  Postoperative Visit - Amputation  Date of Surgery: 03/25/2013 - 03/27/2013 Procedure(s): AMPUTATION ABOVE KNEE Left Surgeon: Surgeon(s): Chuck Hint, MD  2 Days Post-Op  Subjective [name] is a 57 y.o. female who is 2 Days Post-Op. Pt.denies  pain in the stump. The patient notes pain is well controlled. Pt. denies phantom pain. Pt states transfer with PT difficult now that she does not have left leg. She does not have prothesis on right yet.  Significant Diagnostic Studies: CBC Lab Results  Component Value Date   WBC 14.6* 03/29/2013   HGB 10.9* 03/29/2013   HCT 33.3* 03/29/2013   MCV 85.2 03/29/2013   PLT 194 03/29/2013   Physical Examination  BP Readings from Last 3 Encounters:  03/29/13 102/68  03/29/13 102/68  03/07/13 130/73   Temp Readings from Last 3 Encounters:  03/29/13 98.1 F (36.7 C) Oral  03/29/13 98.1 F (36.7 C) Oral  03/07/13 98.8 F (37.1 C) Oral   SpO2 Readings from Last 3 Encounters:  03/29/13 95%  03/29/13 95%  03/07/13 100%   Pulse Readings from Last 3 Encounters:  03/29/13 99  03/29/13 99  03/07/13 97   Pt is A&Ox3  WDWN female with no complaints  Left amputation wound Has vac in place thigh warm There is good bone coverage in the stump Stump is warm and well perfused, without drainage; without erythema  Assessment/plan:  [name] is a 57 y.o. female who is s/p Left Procedure(s): AMPUTATION ABOVE KNEE  The patient's stump is viable.  Pt does not have prothesis for right BKA - ? If this is an option due to previus wound healing diff on the right prothesis may help with transfers  CIR vs SNF for rehab  Poss DC incisional vac tomorrow  Follow-up 4 weeks from surgery  ROCZNIAK,REGINA J 8:43 AM 03/29/2013  Agree with above.  D/C VAC tomorrow. May be transferred to inpt rehab tomorrow.   Waverly Ferrari, MD, FACS Beeper [phone] 03/29/2013

## 2013-03-29 NOTE — H&P (Signed)
Physical Medicine and Rehabilitation Admission H&P    Chief Complaint  Patient presents with  .  L-AKA due to osteomyelitis and bacteremia and h/o R-BKA.   : HPI:  [name] is a 57 y.o. female with history of SLE, DM type 2, ESRD--failed transplant, PVD-s/p R BKA with chronic ulcers left heel and and left thigh wound s/p L-FPBG. She's well known to rehab from prior stays past R-BKA and CABG and was independent with use of R-BK prosthesis PTA. She was admitted on 03/15/13 past Fall and reports of 1-2 week history of weakness. Left heel ulcer with necrotic material and foul odor and xrays done revealed osteomyelitis with bone avulsion. She was started on IV antibiotics and vascular felt that foot was not salvageable. On 07/15, patient underwent L-AKA with placement of VAC by Dr. Dickson. Wound culture positive for Pseudomonas and antibiotics changed to gentamicin on 07/16 with recommendations for 2 weeks treatment. Blood cultures pending. Therapies initiated and patient deconditioned with impairments due to AKA. Therapy team recommending CIR. Ultimately the patient was admitted to inpatient rehab today.    Review of Systems  HENT: Negative for hearing loss.   Respiratory: Negative for cough, shortness of breath and wheezing.   Cardiovascular: Negative for chest pain.  Gastrointestinal: Negative for nausea and abdominal pain.  Musculoskeletal: Positive for myalgias and joint pain (LLE pain).  Neurological: Positive for weakness. Negative for headaches.  Psychiatric/Behavioral: Negative for depression. The patient is not nervous/anxious and does not have insomnia.    Past Medical History  Diagnosis Date  . Diabetes mellitus, type 2   . Secondary hyperparathyroidism   . OSA (obstructive sleep apnea)     NPSG 11.23.11-AHI 64.9/hr-central and obstructive apnea  . Ischemic cardiomyopathy     EF 25-30% 2011  . CAD (coronary artery disease)   . Pulmonary hypertension     Est PAsyst 60-Dr H.  Smith, off coumadin, CT neg for PE  . Hyperlipidemia   . Anemia   . Thyroid disease   . Pulmonary edema   . Peripheral vascular disease   . Sebaceous cyst   . 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 RowePred/plaquenil  . Irregular heart beat   . Shortness of breath     with exertion  . Hemodialysis patient     M,W,F  . TIA (transient ischemic attack) June 2014  . Ulcer of heel due to diabetes     left  . Kidney disease Dialysis MWF    ESRD secondary to DM, failed transplant 2006. Dialysis   Past Surgical History  Procedure Laterality Date  . Coronary artery bypass graft  2011    3V  . Insertion of dialysis catheter    . 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 Nasal mucosal biopsy    . Ovarian cyst surgery    . 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 Laser retinal surgery  2011  . Kidney transplant      rejection  . Abdominal angiogram Left 01/25/13  . Colonoscopy    . Cardiac catheterization    . Eye surgery    . Femoral-popliteal bypass graft Left 02/20/2013    Procedure:  FEMORAL-to below knee POPLITEAL ARTERY bypasswith saphenous vein with intraoperative angiogram ;  Surgeon: Christopher S Dickson, MD;  Location: [facility] OR;  Service: Vascular;  Laterality: Left;  fem-below knee pop  . Amputation Left 03/27/2013    Procedure: AMPUTATION ABOVE KNEE;    Surgeon: Christopher S Dickson, MD;  Location: [facility] OR;  Service: Vascular;  Laterality: Left;   Family History  Problem Relation Age of Onset  . Heart disease    . Edema Father     fluid overload  . Cerebral aneurysm Mother   . Stroke Brother   . Heart disease Brother    Social History: Lives alone. Independent PTA. Has niece and nephew who check in on her. She reports that she has never smoked. She has never used smokeless tobacco. She reports that she does not drink alcohol or use illicit drugs.    Allergies  Allergen Reactions  . Neurontin (Gabapentin) Anaphylaxis  .  Ezetimibe-Simvastatin Other (See Comments)    REACTION: face edema; itching  . Klonopin (Clonazepam) Other (See Comments)    hallucination  . Sertraline Hcl Other (See Comments)    hallucination   Medications Prior to Admission  Medication Sig Dispense Refill  . acetaminophen (TYLENOL) 325 MG tablet Take 650 mg by mouth every 6 (six) hours as needed for pain.       . atorvastatin (LIPITOR) 10 MG tablet Take 10 mg by mouth daily before breakfast.       . B Complex-C (B-COMPLEX WITH VITAMIN C) tablet Take 1 tablet by mouth daily.      . cinacalcet (SENSIPAR) 60 MG tablet Take 60 mg by mouth daily before breakfast.       . clopidogrel (PLAVIX) 75 MG tablet Take 1 tablet (75 mg total) by mouth daily.  30 tablet  0  . collagenase (SANTYL) ointment Apply 1 application topically every other day.      . ibuprofen (ADVIL,MOTRIN) 600 MG tablet Take 600 mg by mouth every 6 (six) hours as needed for pain.       . insulin aspart (NOVOLOG) 100 UNIT/ML injection Inject 3-16 Units into the skin 3 (three) times daily before meals. Sliding scale      . insulin glargine (LANTUS) 100 UNIT/ML injection Inject 12 Units into the skin at bedtime.       . midodrine (PROAMATINE) 10 MG tablet Take 10 mg by mouth 3 (three) times a week. Take on Monday, Wednesday, and Friday      . oxyCODONE (OXY IR/ROXICODONE) 5 MG immediate release tablet Take 1 tablet (5 mg total) by mouth every 8 (eight) hours as needed for pain.  30 tablet  0  . Propylene Glycol (SYSTANE BALANCE OP) Place 1 drop into both eyes as needed (dry eyes). Dry eyes      . sevelamer (RENVELA) 800 MG tablet Take 2,400 mg by mouth 3 (three) times daily with meals. 3 tabs by mouth three times a day      . tetrahydrozoline-zinc (VISINE-AC) 0.05-0.25 % ophthalmic solution Place 2 drops into both eyes 3 (three) times daily as needed (for redness).      . cephALEXin (KEFLEX) 250 MG capsule Take 1 capsule (250 mg total) by mouth 2 (two) times daily.  28 capsule  0     Home: Home Living Family/patient expects to be discharged to:: Inpatient rehab Living Arrangements: Alone   Functional History: Prior Function Comments: Pt states she used 4 wheeled walker to ambulate short distances and manual wheelchair for longer distances  Functional Status:  Mobility: Bed Mobility Bed Mobility: Supine to Sit Supine to Sit: 4: Min assist Transfers Transfers: Lateral/Scoot Transfers Lateral/Scoot Transfers: 3: Mod assist;From elevated surface Ambulation/Gait Ambulation/Gait Assistance: Not tested (comment)    ADL:    Cognition: Cognition Overall   Cognitive Status: Within Functional Limits for tasks assessed Orientation Level: Oriented X4 Cognition Arousal/Alertness: Awake/alert Behavior During Therapy: WFL for tasks assessed/performed Overall Cognitive Status: Within Functional Limits for tasks assessed  Physical Exam: Blood pressure 102/68, pulse 99, temperature 98.1 F (36.7 C), temperature source Oral, resp. rate 18, height 5' 9" (1.753 m), weight 83.689 kg (184 lb 8 oz), SpO2 95.00%. Nursing note and vitals reviewed.  Constitutional: She is oriented to person, place, and time. She appears well-developed and well-nourished.  HENT: oral mucosa pink and moist. Dentition borderline Head: Normocephalic and atraumatic.  Eyes: Conjunctivae are normal. Pupils are equal, round, and reactive to light.  Neck: Normal range of motion.  Cardiovascular: Normal rate and regular rhythm.  Pulmonary/Chest: Effort normal and breath sounds normal. No respiratory distress. She has no wheezes.  Abdominal: Soft. Bowel sounds are normal. She exhibits no distension.  Musculoskeletal: She exhibits no tenderness.  R-BKA stump well healed. Old callus. L-AKA with VAC still in place. Left stump is appropriately tender. Pt can touch limb, examiner can touch limb however. Leg is edematous.  Neurological: She is alert and oriented to person, place, and time.  Alert and  oriented. No cognitive deficits. No gross sensory loss. UE's are 4+/5 prox to distal.  RLE is 4/5 at hip/knee. Can lift left hip agst gravity.   Skin: Skin is warm and dry.  Psychiatric: She has a normal mood and affect. Her speech is normal and behavior is normal. Judgment and thought content normal. Cognition and memory are normal.     Results for orders placed during the hospital encounter of 03/25/13 (from the past 48 hour(s))  GLUCOSE, CAPILLARY     Status: Abnormal   Collection Time    03/27/13 10:50 AM      Result Value Range   Glucose-Capillary 106 (*) 70 - 99 mg/dL  GLUCOSE, CAPILLARY     Status: None   Collection Time    03/27/13  4:24 PM      Result Value Range   Glucose-Capillary 97  70 - 99 mg/dL  GLUCOSE, CAPILLARY     Status: Abnormal   Collection Time    03/27/13  9:04 PM      Result Value Range   Glucose-Capillary 125 (*) 70 - 99 mg/dL   Comment 1 Documented in Chart     Comment 2 Notify RN    GLUCOSE, CAPILLARY     Status: Abnormal   Collection Time    03/28/13  3:15 AM      Result Value Range   Glucose-Capillary 111 (*) 70 - 99 mg/dL  BASIC METABOLIC PANEL     Status: Abnormal   Collection Time    03/28/13  5:00 AM      Result Value Range   Sodium 133 (*) 135 - 145 mEq/L   Potassium 4.0  3.5 - 5.1 mEq/L   Chloride 94 (*) 96 - 112 mEq/L   CO2 28  19 - 32 mEq/L   Glucose, Bld 106 (*) 70 - 99 mg/dL   BUN 19  6 - 23 mg/dL   Creatinine, Ser 4.50 (*) 0.50 - 1.10 mg/dL   Calcium 9.2  8.4 - 10.5 mg/dL   GFR calc non Af Amer 10 (*) >90 mL/min   GFR calc Af Amer 11 (*) >90 mL/min   Comment:            The eGFR has been calculated     using the CKD EPI equation.       This calculation has not been     validated in all clinical     situations.     eGFR's persistently     <90 mL/min signify     possible Chronic Kidney Disease.  CBC     Status: Abnormal   Collection Time    03/28/13  5:00 AM      Result Value Range   WBC 12.7 (*) 4.0 - 10.5 K/uL   RBC 3.64  (*) 3.87 - 5.11 MIL/uL   Hemoglobin 10.2 (*) 12.0 - 15.0 g/dL   HCT 30.9 (*) 36.0 - 46.0 %   MCV 84.9  78.0 - 100.0 fL   MCH 28.0  26.0 - 34.0 pg   MCHC 33.0  30.0 - 36.0 g/dL   RDW 16.2 (*) 11.5 - 15.5 %   Platelets 174  150 - 400 K/uL  GLUCOSE, CAPILLARY     Status: None   Collection Time    03/28/13 11:18 AM      Result Value Range   Glucose-Capillary 86  70 - 99 mg/dL  GLUCOSE, CAPILLARY     Status: None   Collection Time    03/28/13  5:39 PM      Result Value Range   Glucose-Capillary 91  70 - 99 mg/dL  GLUCOSE, CAPILLARY     Status: None   Collection Time    03/28/13  9:53 PM      Result Value Range   Glucose-Capillary 88  70 - 99 mg/dL   Comment 1 Documented in Chart     Comment 2 Notify RN    BASIC METABOLIC PANEL     Status: Abnormal   Collection Time    03/29/13  5:25 AM      Result Value Range   Sodium 133 (*) 135 - 145 mEq/L   Potassium 4.2  3.5 - 5.1 mEq/L   Chloride 95 (*) 96 - 112 mEq/L   CO2 27  19 - 32 mEq/L   Glucose, Bld 131 (*) 70 - 99 mg/dL   BUN 11  6 - 23 mg/dL   Creatinine, Ser 3.42 (*) 0.50 - 1.10 mg/dL   Calcium 9.0  8.4 - 10.5 mg/dL   GFR calc non Af Amer 14 (*) >90 mL/min   GFR calc Af Amer 16 (*) >90 mL/min   Comment:            The eGFR has been calculated     using the CKD EPI equation.     This calculation has not been     validated in all clinical     situations.     eGFR's persistently     <90 mL/min signify     possible Chronic Kidney Disease.  CBC     Status: Abnormal   Collection Time    03/29/13  5:25 AM      Result Value Range   WBC 14.6 (*) 4.0 - 10.5 K/uL   RBC 3.91  3.87 - 5.11 MIL/uL   Hemoglobin 10.9 (*) 12.0 - 15.0 g/dL   HCT 33.3 (*) 36.0 - 46.0 %   MCV 85.2  78.0 - 100.0 fL   MCH 27.9  26.0 - 34.0 pg   MCHC 32.7  30.0 - 36.0 g/dL   RDW 17.0 (*) 11.5 - 15.5 %   Platelets 194  150 - 400 K/uL   No results found.  Post Admission Physician Evaluation: 1. Functional deficits secondary  to left AKA required due  to   osteomyelitis of the left lower ext. 2. Patient is admitted to receive collaborative, interdisciplinary care between the physiatrist, rehab nursing staff, and therapy team. 3. Patient's level of medical complexity and substantial therapy needs in context of that medical necessity cannot be provided at a lesser intensity of care such as a SNF. 4. Patient has experienced substantial functional loss from his/her baseline which was documented above under the "Functional History" and "Functional Status" headings.  Judging by the patient's diagnosis, physical exam, and functional history, the patient has potential for functional progress which will result in measurable gains while on inpatient rehab.  These gains will be of substantial and practical use upon discharge  in facilitating mobility and self-care at the household level. 5. Physiatrist will provide 24 hour management of medical needs as well as oversight of the therapy plan/treatment and provide guidance as appropriate regarding the interaction of the two. 6. 24 hour rehab nursing will assist with bladder management, bowel management, safety, skin/wound care, disease management, medication administration, pain management and patient education  and help integrate therapy concepts, techniques,education, etc. 7. PT will assess and treat for/with: Lower extremity strength, range of motion, stamina, balance, functional mobility, safety, adaptive techniques and equipment, pre-pros ed, pain mgt,.   Goals are: mod I. 8. OT will assess and treat for/with: ADL's, functional mobility, safety, upper extremity strength, adaptive techniques and equipment, pre-pros ed, pain mgt.   Goals are: mod I. 9. SLP will assess and treat for/with: n/a.  Goals are: n/a. 10. Case Management and Social Worker will assess and treat for psychological issues and discharge planning. 11. Team conference will be held weekly to assess progress toward goals and to determine barriers to  discharge. 12. Patient will receive at least 3 hours of therapy per day at least 5 days per week. 13. ELOS: 7-9 days       14. Prognosis:  excellent   Medical Problem List and Plan: 1. DVT Prophylaxis/Anticoagulation: Pharmaceutical: Lovenox 2. Pain Management: Continues to have significant pain L-AKA. Will monitor and adjust. Premedicated prior to therapies.  3. Mood: seems to be dealing with situation appropriately. Provide ego support. Will have LCSW follow for evaluation/support.  4. Neuropsych: This patient is capable of making decisions on her own behalf. 5. ESRD: HD on M, W, F. Check daily weights with 1200 cc fluid restrictions.  6. DM type 2: Hypoglycemic episodes noted. Will adjust lantus insulin and titrate as needed. Monitor BS with ac/hs cbg checks.  7. CAD with ICM: Asymptomatic. On midodrine due to hypotension. Resume ASA. Did not use plavix at home.  8. Anemia of chronic disease: On aranesp weekly with routine monitoring of labs in dialysis.  9. OSA: Resume CPAP--uses face mask at home. 10. Pseudomonas wound infection: Gentamicin thorough 07/29 for two week antibiotic therapy? Now with AKA and question need for antibiotics if BC negative. Reactive leukocytosis continues.    Candace Montesinos T. Lorrene Graef, MD, FAAPMR [facility]   03/29/2013 

## 2013-03-29 NOTE — Progress Notes (Signed)
[name] Progress Note  Subjective:   Stump pain, but good spirits  Objective Filed Vitals:   03/28/13 1645 03/28/13 1930 03/28/13 2201 03/29/13 0637  BP: 103/69  123/63 102/68  Pulse: 92  99 99  Temp: 98.4 F (36.9 C)  98.2 F (36.8 C) 98.1 F (36.7 C)  TempSrc: Oral  Oral Oral  Resp: 18  18 18   Height:  5\' 9"  (1.753 m)    Weight:  83.689 kg (184 lb 8 oz)    SpO2: 94%  94% 95%   Physical Exam General: Alert, cooperative, NAD Heart: RRR gr 2/6 M Lungs: CTA bilaterally Abdomen: soft, NT, normal BS liver down  Cm obese Extremities: L AKA w/ wound vac, R BKA Dialysis Access: RUA AVF + bruit  Dialysis Orders: ([facility] MWF)  4hrs EDW 90.5kg Heparin 2800 R AVF Profile 2  Hectorol 2 mcg Epogen none Venofer none  July Labs: Phos 3.6, PTH 198.5. Tsat 24% with Ferritin > 2000  Assessment/Plan: 1. L AKA / nonhealing L heel osteo / gram neg bacteremia- blood cx's from OP HD are growing moraxella and pseudomonas. Now on Gent x 2 weeks 2. ESRD, cont MWF HD, K 4.2, no heparin  3. HTN/Volume - SBPs 100s-120's on midodrine, will need a new EDW at d/c 4. Anemia - Hgb trending down post operatively 11.8 >>10.9, Monitor. epo started 5. Metabolic bone disease - Ca 9.0 (10.6 corrected) Phos 2.9 here . Last PTH 198.5. On Sensipar 60. Continue holding holding hectorol for now. Low Ca bath. Renvela held for low Phos. Renal panel 6. Nutrition - Alb 1.9. High prot renal diet, nepro, multivitamin.  7. DM Type II - On insulin per primary 8. Dispo - CIR vs SNF for rehab 9. Obesity 10. CAD 11. Multiorganism sepsis wound source on AB  Karen E. Broadus John, PA-C [facility] Pager [phone] 03/29/2013,8:58 AM  LOS: 4 days  I have seen and examined this patient and agree with the plan of care  Seen, eval, examined, patient counseled.  Cont ab .  Aquita Simmering L 03/29/2013, 10:16 AM   Additional Objective Labs: Basic Metabolic Panel:  Recent Labs Lab 03/25/13 0916   03/27/13 0605 03/28/13 0500 03/29/13 0525  NA 133*  < > 134* 133* 133*  K 3.8  < > 3.5 4.0 4.2  CL 91*  < > 93* 94* 95*  CO2 31  < > 30 28 27   GLUCOSE 125*  < > 88 106* 131*  BUN 23  < > 13 19 11   CREATININE 4.27*  < > 3.68* 4.50* 3.42*  CALCIUM 9.9  < > 9.4 9.2 9.0  PHOS 2.9  --   --   --   --   < > = values in this interval not displayed. Liver Function Tests:  Recent Labs Lab 03/26/13 0430  AST 18  ALT 15  ALKPHOS 108  BILITOT 0.6  PROT 7.1  ALBUMIN 1.9*   CBC:  Recent Labs Lab 03/25/13 0916 03/27/13 0605 03/28/13 0500 03/29/13 0525  WBC 13.9* 11.1* 12.7* 14.6*  NEUTROABS 11.4*  --   --   --   HGB 11.8* 10.3* 10.2* 10.9*  HCT 35.2* 32.7* 30.9* 33.3*  MCV 84.0 85.2 84.9 85.2  PLT 142* 172 174 194   Blood Culture    Component Value Date/Time   SDES WOUND LEFT FOOT 03/26/2013 1339   SPECREQUEST LEFT HEEL 03/26/2013 1339   CULT ABUNDANT PSEUDOMONAS AERUGINOSA 03/26/2013 1339   REPTSTATUS PENDING 03/26/2013 1339  Cardiac Enzymes:  Recent Labs Lab 03/25/13 0917  TROPONINI <0.30   CBG:  Recent Labs Lab 03/27/13 2104 03/28/13 0315 03/28/13 1118 03/28/13 1739 03/28/13 2153  GLUCAP 125* 111* 86 91 88   Studies/Results: No results found. Medications:   . antiseptic oral rinse  15 mL Mouth Rinse BID  . atorvastatin  10 mg Oral QAC breakfast  . cinacalcet  60 mg Oral Q breakfast  . collagenase  1 application Topical QODAY  . darbepoetin (ARANESP) injection - DIALYSIS  100 mcg Intravenous Q Wed-HD  . docusate sodium  100 mg Oral Daily  . enoxaparin (LOVENOX) injection  30 mg Subcutaneous Q24H  . feeding supplement (NEPRO CARB STEADY)  237 mL Oral BID BM  . insulin aspart  0-9 Units Subcutaneous TID WC  . insulin glargine  6 Units Subcutaneous QHS  . midodrine  10 mg Oral 3 times weekly  . multivitamin  1 tablet Oral Q supper  . pantoprazole  40 mg Oral Daily  . sodium chloride  3 mL Intravenous Q12H

## 2013-03-29 NOTE — Progress Notes (Addendum)
[facility] PROGRESS NOTE  [name]:[id number] DOB: [birth year] DOA: 03/25/2013 PCP: No primary provider on file.  Assessment/Plan:  Bacteremia with Morganella morganii and Pseudomonas from 7/9 blood cultures.   -  Blood cultures from 7/13 NGTD -  Was placed empirically on vanc and zosyn, however, the PSA was resistant to zosyn -  Treat for 14 days with gentamicin, day 1 on 7/16, last day on 7/29.  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 - culture growing PSA, sensitive to gent and FQ - Anticipate wound VAC removal on 7/18 - Appreciate vascular surgery assistant  Peripheral vascular disease with left lower extremity ulcerations   -Status post left femoral popliteal bypass 02/20/2013 and left AKA 7/15 -continue Oxycodone 5 mg every 6 hours when necessary pain   Ischemic cardiomyopathy EF 45-50%, grade 1 DD -Continue aspirin and Plavix - Not on BB or ACEI due to hypotension, particularly with HD  Diabetes mellitus type 2, blood glucose control -continue decreased dose Lantus for now - continue NovoLog sliding scale   ESRD  -dialysis per renal -The patient takes Midrin 10 mg Monday, Wednesday, Fridays before dialysis   Hyperlipidemia stable. -Continue Lipitor   DIET:  Renal IVF:  OFF PROPH:  lovenox  Code Status: full Family Communication: Spoke with patient alone Disposition Plan:  We'll make arrangements for outpatient gentamicin for the next 2 weeks.  Planning for CIR hopefully tomorrow after wound vac removed.   Consultants:  Vascular- Dr Darrick Penna, Dr. Edilia Bo  Nephrology, Dr. Darrick Penna  Procedures:  L. AKA 7/15  Antibiotics:  Vanc and zosyn 7/13 >> 7/16  Gentamicin 7/16 >> (anticipated end date of 7/29)  HPI/Subjective:  Has pain that is controlled in the left lower extremity. She has been eating okay, denies nausea, vomiting, abdominal pain. She is moving her bowels all right.  She denies  fevers and chills.  She states that she was frustrated while working with physical therapy because she does not have much leg left. She had a hard time transferring to the chair.  Objective: Filed Vitals:   03/28/13 1645 03/28/13 1930 03/28/13 2201 03/29/13 0637  BP: 103/69  123/63 102/68  Pulse: 92  99 99  Temp: 98.4 F (36.9 C)  98.2 F (36.8 C) 98.1 F (36.7 C)  TempSrc: Oral  Oral Oral  Resp: 18  18 18   Height:  5\' 9"  (1.753 m)    Weight:  83.689 kg (184 lb 8 oz)    SpO2: 94%  94% 95%    Intake/Output Summary (Last 24 hours) at 03/29/13 1237 Last data filed at 03/29/13 8119  Gross per 24 hour  Intake    720 ml  Output      0 ml  Net    720 ml   Filed Weights   03/28/13 0637 03/28/13 1041 03/28/13 1930  Weight: 83.4 kg (183 lb 13.8 oz) 83.5 kg (184 lb 1.4 oz) 83.689 kg (184 lb 8 oz)   Exam:  General: African American female, alert & oriented x 3, NAD HEENT:  NCAT, MMM Cardiovascular: RRR, nl S1 s2, gallop present Respiratory: CTAB Abdomen: soft +BS NT/ND, no masses palpable Extremities: R. BKA, L.AKA with wound vac extending laterally along distal portion with good seal.   Leg is less warm, no erythema, mild swelling inferiorly    Data Reviewed: Basic Metabolic Panel:  Recent Labs Lab 03/25/13 0916 03/26/13 0430 03/27/13 0605 03/28/13 0500 03/29/13 0525  NA 133* 134* 134*  133* 133*  K 3.8 4.6 3.5 4.0 4.2  CL 91* 93* 93* 94* 95*  CO2 31 31 30 28 27   GLUCOSE 125* 86 88 106* 131*  BUN 23 27* 13 19 11   CREATININE 4.27* 5.22* 3.68* 4.50* 3.42*  CALCIUM 9.9 9.7 9.4 9.2 9.0  MG 2.2  --   --   --   --   PHOS 2.9  --   --   --   --    Liver Function Tests:  Recent Labs Lab 03/26/13 0430  AST 18  ALT 15  ALKPHOS 108  BILITOT 0.6  PROT 7.1  ALBUMIN 1.9*   No results found for this basename: LIPASE, AMYLASE,  in the last 168 hours No results found for this basename: AMMONIA,  in the last 168 hours CBC:  Recent Labs Lab 03/25/13 0916 03/27/13 0605  03/28/13 0500 03/29/13 0525  WBC 13.9* 11.1* 12.7* 14.6*  NEUTROABS 11.4*  --   --   --   HGB 11.8* 10.3* 10.2* 10.9*  HCT 35.2* 32.7* 30.9* 33.3*  MCV 84.0 85.2 84.9 85.2  PLT 142* 172 174 194   Cardiac Enzymes:  Recent Labs Lab 03/25/13 0917  TROPONINI <0.30   BNP (last 3 results) No results found for this basename: PROBNP,  in the last 8760 hours CBG:  Recent Labs Lab 03/28/13 0315 03/28/13 1118 03/28/13 1739 03/28/13 2153 03/29/13 1117  GLUCAP 111* 86 91 88 139*    Recent Results (from the past 240 hour(s))  CULTURE, BLOOD (ROUTINE X 2)     Status: None   Collection Time    03/25/13  9:17 AM      Result Value Range Status   Specimen Description BLOOD LEFT ARM   Final   Special Requests BOTTLES DRAWN AEROBIC ONLY 10CC   Final   Culture  Setup Time 03/25/2013 20:46   Final   Culture     Final   Value:        BLOOD CULTURE RECEIVED NO GROWTH TO DATE CULTURE WILL BE HELD FOR 5 DAYS BEFORE ISSUING A FINAL NEGATIVE REPORT   Report Status PENDING   Incomplete  CULTURE, BLOOD (ROUTINE X 2)     Status: None   Collection Time    03/25/13  9:22 AM      Result Value Range Status   Specimen Description BLOOD LEFT HAND   Final   Special Requests BOTTLES DRAWN AEROBIC ONLY 3CC   Final   Culture  Setup Time 03/25/2013 20:46   Final   Culture     Final   Value:        BLOOD CULTURE RECEIVED NO GROWTH TO DATE CULTURE WILL BE HELD FOR 5 DAYS BEFORE ISSUING A FINAL NEGATIVE REPORT   Report Status PENDING   Incomplete  WOUND CULTURE     Status: None   Collection Time    03/26/13  1:39 PM      Result Value Range Status   Specimen Description WOUND LEFT FOOT   Final   Special Requests LEFT HEEL   Final   Gram Stain     Final   Value: NO WBC SEEN     NO SQUAMOUS EPITHELIAL CELLS SEEN     NO ORGANISMS SEEN   Culture     Final   Value: ABUNDANT PSEUDOMONAS AERUGINOSA     Note: PTZ TO FOLLOW   Report Status PENDING   Incomplete   Organism ID, Bacteria PSEUDOMONAS AERUGINOSA  Final  SURGICAL PCR SCREEN     Status: None   Collection Time    03/27/13  1:25 AM      Result Value Range Status   MRSA, PCR NEGATIVE  NEGATIVE Final   Staphylococcus aureus NEGATIVE  NEGATIVE Final   Comment:            The Xpert SA Assay (FDA     approved for NASAL specimens     in patients over 58 years of age),     is one component of     a comprehensive surveillance     program.  Test performance has     been validated by [employer] for patients greater     than or equal to 68 year old.     It is not intended     to diagnose infection nor to     guide or monitor treatment.     Studies: No results found.  Scheduled Meds: . antiseptic oral rinse  15 mL Mouth Rinse BID  . atorvastatin  10 mg Oral QAC breakfast  . cinacalcet  60 mg Oral Q breakfast  . collagenase  1 application Topical QODAY  . darbepoetin (ARANESP) injection - DIALYSIS  100 mcg Intravenous Q Wed-HD  . docusate sodium  100 mg Oral Daily  . enoxaparin (LOVENOX) injection  30 mg Subcutaneous Q24H  . feeding supplement (NEPRO CARB STEADY)  237 mL Oral BID BM  . insulin aspart  0-9 Units Subcutaneous TID WC  . insulin glargine  6 Units Subcutaneous QHS  . midodrine  10 mg Oral 3 times weekly  . multivitamin  1 tablet Oral Q supper  . pantoprazole  40 mg Oral Daily  . sodium chloride  3 mL Intravenous Q12H   Continuous Infusions:   Active Problems:   HYPERLIPIDEMIA   End stage renal disease   Cardiomyopathy, ischemic   Atherosclerotic PVD with ulceration   Acute osteomyelitis of foot   Coronary atherosclerosis of native coronary artery   Sepsis   Anemia of chronic renal failure   Secondary hyperparathyroidism    Time spent: 25    Beula Joyner, [facility]  [facility] Pager [phone]. If 7PM-7AM, please contact night-coverage at [email or web address], password [facility] 03/29/2013, 12:37 PM  LOS: 4 days

## 2013-03-30 ENCOUNTER — Inpatient Hospital Stay (HOSPITAL_COMMUNITY)
Admission: RE | Admit: 2013-03-30 | Discharge: 2013-04-13 | DRG: 945 | Disposition: A | Discharge status: Home Health | Payer: Medicare Other | Source: Intra-hospital | Attending: Physical Medicine & Rehabilitation | Admitting: Physical Medicine & Rehabilitation

## 2013-03-30 DIAGNOSIS — E1351 Other specified diabetes mellitus with diabetic peripheral angiopathy without gangrene: Secondary | ICD-10-CM | POA: Diagnosis present

## 2013-03-30 DIAGNOSIS — I2789 Other specified pulmonary heart diseases: Secondary | ICD-10-CM | POA: Diagnosis present

## 2013-03-30 DIAGNOSIS — Z96659 Presence of unspecified artificial knee joint: Secondary | ICD-10-CM

## 2013-03-30 DIAGNOSIS — T8140XA Infection following a procedure, unspecified, initial encounter: Secondary | ICD-10-CM | POA: Diagnosis present

## 2013-03-30 DIAGNOSIS — I2510 Atherosclerotic heart disease of native coronary artery without angina pectoris: Secondary | ICD-10-CM | POA: Diagnosis present

## 2013-03-30 DIAGNOSIS — E785 Hyperlipidemia, unspecified: Secondary | ICD-10-CM | POA: Diagnosis present

## 2013-03-30 DIAGNOSIS — E1159 Type 2 diabetes mellitus with other circulatory complications: Secondary | ICD-10-CM | POA: Diagnosis present

## 2013-03-30 DIAGNOSIS — Z8673 Personal history of transient ischemic attack (TIA), and cerebral infarction without residual deficits: Secondary | ICD-10-CM

## 2013-03-30 DIAGNOSIS — I2589 Other forms of chronic ischemic heart disease: Secondary | ICD-10-CM | POA: Diagnosis present

## 2013-03-30 DIAGNOSIS — N186 End stage renal disease: Secondary | ICD-10-CM | POA: Diagnosis present

## 2013-03-30 DIAGNOSIS — M869 Osteomyelitis, unspecified: Secondary | ICD-10-CM | POA: Diagnosis present

## 2013-03-30 DIAGNOSIS — S78119A Complete traumatic amputation at level between unspecified hip and knee, initial encounter: Secondary | ICD-10-CM

## 2013-03-30 DIAGNOSIS — D72829 Elevated white blood cell count, unspecified: Secondary | ICD-10-CM | POA: Diagnosis present

## 2013-03-30 DIAGNOSIS — Z951 Presence of aortocoronary bypass graft: Secondary | ICD-10-CM

## 2013-03-30 DIAGNOSIS — D638 Anemia in other chronic diseases classified elsewhere: Secondary | ICD-10-CM | POA: Diagnosis present

## 2013-03-30 DIAGNOSIS — N185 Chronic kidney disease, stage 5: Secondary | ICD-10-CM

## 2013-03-30 DIAGNOSIS — I798 Other disorders of arteries, arterioles and capillaries in diseases classified elsewhere: Secondary | ICD-10-CM | POA: Diagnosis present

## 2013-03-30 DIAGNOSIS — N2581 Secondary hyperparathyroidism of renal origin: Secondary | ICD-10-CM | POA: Diagnosis present

## 2013-03-30 DIAGNOSIS — Z794 Long term (current) use of insulin: Secondary | ICD-10-CM

## 2013-03-30 DIAGNOSIS — Z89511 Acquired absence of right leg below knee: Secondary | ICD-10-CM

## 2013-03-30 DIAGNOSIS — T8131XA Disruption of external operation (surgical) wound, not elsewhere classified, initial encounter: Secondary | ICD-10-CM

## 2013-03-30 DIAGNOSIS — A419 Sepsis, unspecified organism: Secondary | ICD-10-CM | POA: Diagnosis present

## 2013-03-30 DIAGNOSIS — Y838 Other surgical procedures as the cause of abnormal reaction of the patient, or of later complication, without mention of misadventure at the time of the procedure: Secondary | ICD-10-CM | POA: Diagnosis present

## 2013-03-30 DIAGNOSIS — Z5189 Encounter for other specified aftercare: Principal | ICD-10-CM

## 2013-03-30 DIAGNOSIS — Z992 Dependence on renal dialysis: Secondary | ICD-10-CM

## 2013-03-30 DIAGNOSIS — G4733 Obstructive sleep apnea (adult) (pediatric): Secondary | ICD-10-CM | POA: Diagnosis present

## 2013-03-30 DIAGNOSIS — L97409 Non-pressure chronic ulcer of unspecified heel and midfoot with unspecified severity: Secondary | ICD-10-CM | POA: Diagnosis present

## 2013-03-30 DIAGNOSIS — L98499 Non-pressure chronic ulcer of skin of other sites with unspecified severity: Secondary | ICD-10-CM

## 2013-03-30 DIAGNOSIS — I739 Peripheral vascular disease, unspecified: Secondary | ICD-10-CM

## 2013-03-30 DIAGNOSIS — E1129 Type 2 diabetes mellitus with other diabetic kidney complication: Secondary | ICD-10-CM | POA: Diagnosis present

## 2013-03-30 DIAGNOSIS — R5381 Other malaise: Secondary | ICD-10-CM | POA: Diagnosis present

## 2013-03-30 LAB — CBC
MCH: 27.7 pg (ref 26.0–34.0)
MCHC: 32.7 g/dL (ref 30.0–36.0)
Platelets: 191 10*3/uL (ref 150–400)
RBC: 3.54 MIL/uL — ABNORMAL LOW (ref 3.87–5.11)

## 2013-03-30 LAB — RENAL FUNCTION PANEL
Albumin: 1.8 g/dL — ABNORMAL LOW (ref 3.5–5.2)
Chloride: 94 mEq/L — ABNORMAL LOW (ref 96–112)
Creatinine, Ser: 4.61 mg/dL — ABNORMAL HIGH (ref 0.50–1.10)
GFR calc non Af Amer: 10 mL/min — ABNORMAL LOW (ref >90)
Sodium: 130 mEq/L — ABNORMAL LOW (ref 135–145)

## 2013-03-30 LAB — WOUND CULTURE: Gram Stain: NONE SEEN

## 2013-03-30 LAB — GLUCOSE, CAPILLARY
Glucose-Capillary: 70 mg/dL (ref 70–99)
Glucose-Capillary: 58 mg/dL — ABNORMAL LOW (ref 70–99)
Glucose-Capillary: 69 mg/dL — ABNORMAL LOW (ref 70–99)
Glucose-Capillary: 82 mg/dL (ref 70–99)
Glucose-Capillary: 78 mg/dL (ref 70–99)

## 2013-03-30 LAB — GENTAMICIN LEVEL, RANDOM: Gentamicin Rm: 3.2 ug/mL

## 2013-03-30 MED ORDER — ATORVASTATIN CALCIUM 10 MG PO TABS
10.0000 mg | ORAL_TABLET | Freq: Every day | ORAL | Status: DC
  Administered 2013-03-31 – 2013-04-13 (×14): 10 mg via ORAL
  Filled 2013-03-30 (×15): qty 1

## 2013-03-30 MED ORDER — MIDODRINE HCL 5 MG PO TABS
10.0000 mg | ORAL_TABLET | ORAL | Status: DC
  Administered 2013-04-02 – 2013-04-13 (×6): 10 mg via ORAL
  Filled 2013-03-30 (×6): qty 2

## 2013-03-30 MED ORDER — PHENOL 1.4 % MT LIQD: 1.0000 | OROMUCOSAL | Status: DC | PRN

## 2013-03-30 MED ORDER — OXYCODONE HCL 5 MG PO TABS
10.0000 mg | ORAL_TABLET | Freq: Two times a day (BID) | ORAL | Status: DC
  Administered 2013-03-31 – 2013-04-13 (×21): 10 mg via ORAL
  Filled 2013-03-30 (×20): qty 2

## 2013-03-30 MED ORDER — FLEET ENEMA 7-19 GM/118ML RE ENEM: 1.0000 | ENEMA | Freq: Once | RECTAL | Status: DC | PRN

## 2013-03-30 MED ORDER — GENTAMICIN SULFATE 40 MG/ML IJ SOLN
140.0000 mg | Freq: Once | INTRAVENOUS | Status: AC
  Administered 2013-03-30: 140 mg via INTRAVENOUS
  Filled 2013-03-30: qty 3.5

## 2013-03-30 MED ORDER — GENTAMICIN SULFATE 40 MG/ML IJ SOLN: 140.0000 mg | INTRAVENOUS | Status: DC

## 2013-03-30 MED ORDER — NEPRO/CARBSTEADY PO LIQD
237.0000 mL | Freq: Three times a day (TID) | ORAL | Status: DC
  Administered 2013-03-30 – 2013-04-03 (×5): 237 mL via ORAL

## 2013-03-30 MED ORDER — ONDANSETRON HCL 4 MG PO TABS
4.0000 mg | ORAL_TABLET | Freq: Four times a day (QID) | ORAL | Status: DC | PRN
  Administered 2013-04-05 – 2013-04-12 (×5): 4 mg via ORAL
  Filled 2013-03-30 (×5): qty 1

## 2013-03-30 MED ORDER — CINACALCET HCL 30 MG PO TABS
60.0000 mg | ORAL_TABLET | Freq: Every day | ORAL | Status: DC
  Administered 2013-03-31 – 2013-04-13 (×14): 60 mg via ORAL
  Filled 2013-03-30 (×15): qty 2

## 2013-03-30 MED ORDER — COLLAGENASE 250 UNIT/GM EX OINT
TOPICAL_OINTMENT | Freq: Two times a day (BID) | CUTANEOUS | Status: DC
  Administered 2013-03-30 – 2013-04-13 (×28): via TOPICAL
  Filled 2013-03-30 (×2): qty 30

## 2013-03-30 MED ORDER — RENA-VITE PO TABS: 1.0000 | ORAL_TABLET | Freq: Every day | ORAL | Status: DC

## 2013-03-30 MED ORDER — PANTOPRAZOLE SODIUM 40 MG PO TBEC: 40.0000 mg | DELAYED_RELEASE_TABLET | Freq: Every day | ORAL | Status: DC

## 2013-03-30 MED ORDER — ASPIRIN EC 325 MG PO TBEC: 325.0000 mg | DELAYED_RELEASE_TABLET | Freq: Every day | ORAL | Status: DC

## 2013-03-30 MED ORDER — SORBITOL 70 % SOLN
30.0000 mL | Freq: Two times a day (BID) | Status: DC | PRN
  Administered 2013-04-05: 30 mL via ORAL
  Filled 2013-03-30: qty 30

## 2013-03-30 MED ORDER — DOCUSATE SODIUM 100 MG PO CAPS
100.0000 mg | ORAL_CAPSULE | Freq: Every day | ORAL | Status: DC
  Administered 2013-03-31 – 2013-04-05 (×6): 100 mg via ORAL
  Filled 2013-03-30 (×8): qty 1

## 2013-03-30 MED ORDER — TRAZODONE HCL 50 MG PO TABS
25.0000 mg | ORAL_TABLET | Freq: Every evening | ORAL | Status: DC | PRN
  Administered 2013-04-03 – 2013-04-11 (×3): 50 mg via ORAL
  Filled 2013-03-30 (×3): qty 1

## 2013-03-30 MED ORDER — OXYCODONE HCL 5 MG PO TABS
10.0000 mg | ORAL_TABLET | ORAL | Status: DC | PRN
  Administered 2013-03-30 – 2013-04-12 (×19): 10 mg via ORAL
  Filled 2013-03-30 (×21): qty 2

## 2013-03-30 MED ORDER — INSULIN ASPART 100 UNIT/ML ~~LOC~~ SOLN: 0.0000 [IU] | Freq: Three times a day (TID) | SUBCUTANEOUS | Status: DC

## 2013-03-30 MED ORDER — DIPHENHYDRAMINE HCL 12.5 MG/5ML PO ELIX: 12.5000 mg | ORAL_SOLUTION | Freq: Four times a day (QID) | ORAL | Status: DC | PRN

## 2013-03-30 MED ORDER — ENOXAPARIN SODIUM 30 MG/0.3ML ~~LOC~~ SOLN
30.0000 mg | SUBCUTANEOUS | Status: DC
  Administered 2013-03-30 – 2013-04-12 (×14): 30 mg via SUBCUTANEOUS
  Filled 2013-03-30 (×15): qty 0.3

## 2013-03-30 MED ORDER — GUAIFENESIN-DM 100-10 MG/5ML PO SYRP: 5.0000 mL | ORAL_SOLUTION | Freq: Four times a day (QID) | ORAL | Status: DC | PRN

## 2013-03-30 MED ORDER — ONDANSETRON HCL 4 MG/2ML IJ SOLN: 4.0000 mg | Freq: Four times a day (QID) | INTRAMUSCULAR | Status: DC | PRN

## 2013-03-30 MED ORDER — DARBEPOETIN ALFA-POLYSORBATE 100 MCG/0.5ML IJ SOLN
100.0000 ug | INTRAMUSCULAR | Status: DC
  Administered 2013-04-04 – 2013-04-11 (×2): 100 ug via INTRAVENOUS
  Filled 2013-03-30 (×2): qty 0.5

## 2013-03-30 MED ORDER — ASPIRIN 325 MG PO TABS
325.0000 mg | ORAL_TABLET | Freq: Every day | ORAL | Status: DC
  Administered 2013-03-31 – 2013-04-13 (×14): 325 mg via ORAL
  Filled 2013-03-30 (×17): qty 1

## 2013-03-30 MED ORDER — BISACODYL 10 MG RE SUPP
10.0000 mg | Freq: Every day | RECTAL | Status: DC | PRN
  Filled 2013-03-30: qty 1

## 2013-03-30 MED ORDER — RENA-VITE PO TABS
1.0000 | ORAL_TABLET | Freq: Every day | ORAL | Status: DC
  Administered 2013-03-30 – 2013-04-12 (×13): 1 via ORAL
  Filled 2013-03-30 (×15): qty 1

## 2013-03-30 MED ORDER — BIOTENE DRY MOUTH MT LIQD
15.0000 mL | Freq: Two times a day (BID) | OROMUCOSAL | Status: DC
  Administered 2013-03-30 – 2013-04-12 (×25): 15 mL via OROMUCOSAL

## 2013-03-30 MED ORDER — ACETAMINOPHEN 325 MG PO TABS: 325.0000 mg | ORAL_TABLET | ORAL | Status: DC | PRN

## 2013-03-30 MED ORDER — INSULIN GLARGINE 100 UNIT/ML ~~LOC~~ SOLN: 4.0000 [IU] | Freq: Every day | SUBCUTANEOUS | Status: DC

## 2013-03-30 MED ORDER — DSS 100 MG PO CAPS: 100.0000 mg | ORAL_CAPSULE | Freq: Every day | ORAL | Status: DC

## 2013-03-30 MED ORDER — NEPRO/CARBSTEADY PO LIQD: 237.0000 mL | Freq: Two times a day (BID) | ORAL | Status: DC

## 2013-03-30 MED ORDER — NAPHAZOLINE HCL 0.1 % OP SOLN
1.0000 [drp] | Freq: Four times a day (QID) | OPHTHALMIC | Status: DC | PRN
  Filled 2013-03-30: qty 15

## 2013-03-30 MED ORDER — PANTOPRAZOLE SODIUM 40 MG PO TBEC
40.0000 mg | DELAYED_RELEASE_TABLET | Freq: Every day | ORAL | Status: DC
  Administered 2013-03-31 – 2013-04-12 (×13): 40 mg via ORAL
  Filled 2013-03-30 (×14): qty 1

## 2013-03-30 MED ORDER — GENTAMICIN SULFATE 40 MG/ML IJ SOLN
140.0000 mg | Freq: Once | INTRAVENOUS | Status: DC
  Filled 2013-03-30: qty 3.5

## 2013-03-30 MED ORDER — HYDROCERIN EX CREA
TOPICAL_CREAM | Freq: Two times a day (BID) | CUTANEOUS | Status: DC
  Administered 2013-03-30 – 2013-03-31 (×2): via TOPICAL
  Administered 2013-03-31: 1 via TOPICAL
  Administered 2013-04-01 – 2013-04-13 (×20): via TOPICAL
  Filled 2013-03-30: qty 113

## 2013-03-30 NOTE — Progress Notes (Addendum)
I have contacted Dr. Malachi Bonds to clarify if pt medically ready to admit to inpt rehab today. Patient in hemodialysis and in agreement. [phone] Plan is to admit pt to inpt rehab later today.

## 2013-03-30 NOTE — Progress Notes (Signed)
Hypoglycemic Event  CBG: 69  Treatment: 15 GM carbohydrate snack  Symptoms: None  Follow-up CBG: Time:1745 CBG Result:82  Possible Reasons for Event: Unknown  Comments/MD notified Pam Love PA    AYERS, Nahomy Limburg D  Remember to initiate Hypoglycemia Order Set & complete

## 2013-03-30 NOTE — Progress Notes (Signed)
ANTIBIOTIC CONSULT NOTE - FOLLOW UP  Pharmacy Consult for Gentamicin Indication: extensive osteomyelitis, now s/p L-AKA  Allergies  Allergen Reactions  . Neurontin (Gabapentin) Anaphylaxis  . Ezetimibe-Simvastatin Other (See Comments)    REACTION: face edema; itching  . Klonopin (Clonazepam) Other (See Comments)    hallucination  . Sertraline Hcl Other (See Comments)    hallucination    Patient Measurements: Height: 5\' 9"  (175.3 cm) Weight: 185 lb 3 oz (84 kg) IBW/kg (Calculated) : 66.2 Adjusted Body Weight:   Vital Signs: Temp: 98.6 F (37 C) (07/18 1242) Temp src: Oral (07/18 1242) BP: 118/60 mmHg (07/18 1242) Pulse Rate: 75 (07/18 1242) Intake/Output from previous day: 07/17 0701 - 07/18 0700 In: 600 [P.O.:600] Out: -  Intake/Output from this shift: Total I/O In: 120 [P.O.:120] Out: 1800 [Other:1800]  Labs:  Recent Labs  03/28/13 0500 03/29/13 0525 03/30/13 0752 03/30/13 0822  WBC 12.7* 14.6*  --  15.1*  HGB 10.2* 10.9*  --  9.8*  PLT 174 194  --  191  CREATININE 4.50* 3.42* 4.61*  --    Estimated Creatinine Clearance: 15.4 ml/min (by C-G formula based on Cr of 4.61).  Recent Labs  03/30/13 0751  GENTRANDOM 3.2     Microbiology: Recent Results (from the past 720 hour(s))  CULTURE, BLOOD (ROUTINE X 2)     Status: None   Collection Time    03/25/13  9:17 AM      Result Value Range Status   Specimen Description BLOOD LEFT ARM   Final   Special Requests BOTTLES DRAWN AEROBIC ONLY 10CC   Final   Culture  Setup Time 03/25/2013 20:46   Final   Culture     Final   Value:        BLOOD CULTURE RECEIVED NO GROWTH TO DATE CULTURE WILL BE HELD FOR 5 DAYS BEFORE ISSUING A FINAL NEGATIVE REPORT   Report Status PENDING   Incomplete  CULTURE, BLOOD (ROUTINE X 2)     Status: None   Collection Time    03/25/13  9:22 AM      Result Value Range Status   Specimen Description BLOOD LEFT HAND   Final   Special Requests BOTTLES DRAWN AEROBIC ONLY 3CC   Final   Culture  Setup Time 03/25/2013 20:46   Final   Culture     Final   Value:        BLOOD CULTURE RECEIVED NO GROWTH TO DATE CULTURE WILL BE HELD FOR 5 DAYS BEFORE ISSUING A FINAL NEGATIVE REPORT   Report Status PENDING   Incomplete  WOUND CULTURE     Status: None   Collection Time    03/26/13  1:39 PM      Result Value Range Status   Specimen Description WOUND LEFT FOOT   Final   Special Requests LEFT HEEL   Final   Gram Stain     Final   Value: NO WBC SEEN     NO SQUAMOUS EPITHELIAL CELLS SEEN     NO ORGANISMS SEEN   Culture ABUNDANT PSEUDOMONAS AERUGINOSA   Final   Report Status 03/30/2013 FINAL   Final   Organism ID, Bacteria PSEUDOMONAS AERUGINOSA   Final  SURGICAL PCR SCREEN     Status: None   Collection Time    03/27/13  1:25 AM      Result Value Range Status   MRSA, PCR NEGATIVE  NEGATIVE Final   Staphylococcus aureus NEGATIVE  NEGATIVE Final   Comment:  The Xpert SA Assay (FDA     approved for NASAL specimens     in patients over 20 years of age),     is one component of     a comprehensive surveillance     program.  Test performance has     been validated by [employer] for patients greater     than or equal to 77 year old.     It is not intended     to diagnose infection nor to     guide or monitor treatment.    Anti-infectives   Start     Dose/Rate Route Frequency Ordered Stop   03/28/13 1500  gentamicin (GARAMYCIN) 200 mg in dextrose 5 % 50 mL IVPB     200 mg 110 mL/hr over 30 Minutes Intravenous  Once 03/28/13 1459 03/28/13 1649   03/27/13 1200  cefUROXime (ZINACEF) 1.5 g in dextrose 5 % 50 mL IVPB     1.5 g 100 mL/hr over 30 Minutes Intravenous Every 12 hours 03/27/13 1044 03/28/13 0057   03/26/13 1200  vancomycin (VANCOCIN) IVPB 1000 mg/200 mL premix  Status:  Discontinued     1,000 mg 200 mL/hr over 60 Minutes Intravenous Every M-W-F (Hemodialysis) 03/25/13 1652 03/28/13 1320   03/25/13 1800  vancomycin (VANCOCIN) 500 mg in sodium chloride  0.9 % 100 mL IVPB     500 mg 100 mL/hr over 60 Minutes Intravenous  Once 03/25/13 1652 03/25/13 1858   03/25/13 1400  piperacillin-tazobactam (ZOSYN) IVPB 2.25 g  Status:  Discontinued     2.25 g 100 mL/hr over 30 Minutes Intravenous 3 times per day 03/25/13 1325 03/28/13 1320      Assessment: 57yo female with extensive osteomyelitis, wound cx (+) pseudomonas and sensitive to gentamicin.  Pre-HD gent level was 3.2 today expected clearance of drug to be 20% per hour with complete HD session.  HD x 4hr today at BFR 400; appropriate to redose.  Goal of Therapy:  Gentamicin trough level <2 mcg/ml  Plan:  Gentamicin 140mg  IV x 1  Marisue Humble, PharmD Clinical Pharmacist [facility]

## 2013-03-30 NOTE — Progress Notes (Signed)
Subjective: Interval History: none.  Objective: Vital signs in last 24 hours: Temp:  [98.2 F (36.8 C)-98.8 F (37.1 C)] 98.3 F (36.8 C) (07/18 0720) Pulse Rate:  [94-100] 95 (07/18 0800) Resp:  [18-19] 18 (07/18 0745) BP: (111-115)/(59-66) 115/63 mmHg (07/18 0800) SpO2:  [98 %-100 %] 98 % (07/18 0720) Weight:  [83.689 kg (184 lb 8 oz)-84 kg (185 lb 3 oz)] 84 kg (185 lb 3 oz) (07/18 0720) Weight change: 0.189 kg (6.7 oz)  Intake/Output from previous day: 07/17 0701 - 07/18 0700 In: 600 [P.O.:600] Out: -  Intake/Output this shift:    General appearance: alert, cooperative and moderately obese Resp: clear to auscultation bilaterally Cardio: systolic murmur: systolic ejection 2/6, decrescendo at 2nd left intercostal space GI: obese, pos bs, liver down 6 cm Extremities: LAKA with vac, wound groin, avf LUA, Rbka  Lab Results:  Recent Labs  03/28/13 0500 03/29/13 0525  WBC 12.7* 14.6*  HGB 10.2* 10.9*  HCT 30.9* 33.3*  PLT 174 194   BMET:  Recent Labs  03/28/13 0500 03/29/13 0525  NA 133* 133*  K 4.0 4.2  CL 94* 95*  CO2 28 27  GLUCOSE 106* 131*  BUN 19 11  CREATININE 4.50* 3.42*  CALCIUM 9.2 9.0   No results found for this basename: PTH,  in the last 72 hours Iron Studies: No results found for this basename: IRON, TIBC, TRANSFERRIN, FERRITIN,  in the last 72 hours  Studies/Results: No results found.  I have reviewed the patient's current medications.  Assessment/Plan: 1 ESRD for HD. 2 DM controlled 3 Sepsis on ab cont x 3 wk  4 Obesity 5 Debill per VVS, ? Rehab 6 CAD 7 Anemia stable 8 HPTH 9OSA 10 ^ Lipids P HD, epo, ab, rehab, wound care   LOS: 5 days   [name] L 03/30/2013,8:05 AM

## 2013-03-30 NOTE — H&P (View-Only) (Signed)
Physical Medicine and Rehabilitation Admission H&P    Chief Complaint  Patient presents with  .  L-AKA due to osteomyelitis and bacteremia and h/o R-BKA.   : HPI:  [name] is a 57 y.o. female with history of SLE, DM type 2, ESRD--failed transplant, PVD-s/p R BKA with chronic ulcers left heel and and left thigh wound s/p L-FPBG. She's well known to rehab from prior stays past R-BKA and CABG and was independent with use of R-BK prosthesis PTA. She was admitted on 03/15/13 past Fall and reports of 1-2 week history of weakness. Left heel ulcer with necrotic material and foul odor and xrays done revealed osteomyelitis with bone avulsion. She was started on IV antibiotics and vascular felt that foot was not salvageable. On 07/15, patient underwent L-AKA with placement of VAC by Dr. Edilia Bo. Wound culture positive for Pseudomonas and antibiotics changed to gentamicin on 07/16 with recommendations for 2 weeks treatment. Blood cultures pending. Therapies initiated and patient deconditioned with impairments due to AKA. Therapy team recommending CIR. Ultimately the patient was admitted to inpatient rehab today.    Review of Systems  HENT: Negative for hearing loss.   Respiratory: Negative for cough, shortness of breath and wheezing.   Cardiovascular: Negative for chest pain.  Gastrointestinal: Negative for nausea and abdominal pain.  Musculoskeletal: Positive for myalgias and joint pain (LLE pain).  Neurological: Positive for weakness. Negative for headaches.  Psychiatric/Behavioral: Negative for depression. The patient is not nervous/anxious and does not have insomnia.    Past Medical History  Diagnosis Date  . Diabetes mellitus, type 2   . Secondary hyperparathyroidism   . OSA (obstructive sleep apnea)     NPSG 11.23.11-AHI 64.9/hr-central and obstructive apnea  . Ischemic cardiomyopathy     EF 25-30% 2011  . CAD (coronary artery disease)   . Pulmonary hypertension     Est PAsyst 60-Dr Mendel Ryder, off coumadin, CT neg for PE  . Hyperlipidemia   . Anemia   . Thyroid disease   . Pulmonary edema   . Peripheral vascular disease   . Sebaceous cyst   . 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 Velvet Bathe  . Irregular heart beat   . Shortness of breath     with exertion  . Hemodialysis patient     M,W,F  . TIA (transient ischemic attack) June 2014  . Ulcer of heel due to diabetes     left  . Kidney disease Dialysis MWF    ESRD secondary to DM, failed transplant 2006. Dialysis   Past Surgical History  Procedure Laterality Date  . Coronary artery bypass graft  2011    3V  . Insertion of dialysis catheter    . 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 Nasal mucosal biopsy    . Ovarian cyst surgery    . 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 Laser retinal surgery  2011  . Kidney transplant      rejection  . Abdominal angiogram Left 01/25/13  . Colonoscopy    . Cardiac catheterization    . Eye surgery    . Femoral-popliteal bypass graft Left 02/20/2013    Procedure:  FEMORAL-to below knee POPLITEAL ARTERY bypasswith saphenous vein with intraoperative angiogram ;  Surgeon: Chuck Hint, MD;  Location: [facility];  Service: Vascular;  Laterality: Left;  fem-below knee pop  . Amputation Left 03/27/2013    Procedure: AMPUTATION ABOVE KNEE;  Surgeon: Chuck Hint, MD;  Location: [facility] OR;  Service: Vascular;  Laterality: Left;   Family History  Problem Relation Age of Onset  . Heart disease    . Edema Father     fluid overload  . Cerebral aneurysm Mother   . Stroke Brother   . Heart disease Brother    Social History: Lives alone. Independent PTA. Has niece and nephew who check in on her. She reports that she has never smoked. She has never used smokeless tobacco. She reports that she does not drink alcohol or use illicit drugs.    Allergies  Allergen Reactions  . Neurontin (Gabapentin) Anaphylaxis  .  Ezetimibe-Simvastatin Other (See Comments)    REACTION: face edema; itching  . Klonopin (Clonazepam) Other (See Comments)    hallucination  . Sertraline Hcl Other (See Comments)    hallucination   Medications Prior to Admission  Medication Sig Dispense Refill  . acetaminophen (TYLENOL) 325 MG tablet Take 650 mg by mouth every 6 (six) hours as needed for pain.       [name] atorvastatin (LIPITOR) 10 MG tablet Take 10 mg by mouth daily before breakfast.       . B Complex-C (B-COMPLEX WITH VITAMIN C) tablet Take 1 tablet by mouth daily.      . cinacalcet (SENSIPAR) 60 MG tablet Take 60 mg by mouth daily before breakfast.       . clopidogrel (PLAVIX) 75 MG tablet Take 1 tablet (75 mg total) by mouth daily.  30 tablet  0  . collagenase (SANTYL) ointment Apply 1 application topically every other day.      . ibuprofen (ADVIL,MOTRIN) 600 MG tablet Take 600 mg by mouth every 6 (six) hours as needed for pain.       [name] insulin aspart (NOVOLOG) 100 UNIT/ML injection Inject 3-16 Units into the skin 3 (three) times daily before meals. Sliding scale      . insulin glargine (LANTUS) 100 UNIT/ML injection Inject 12 Units into the skin at bedtime.       . midodrine (PROAMATINE) 10 MG tablet Take 10 mg by mouth 3 (three) times a week. Take on Monday, Wednesday, and Friday      . oxyCODONE (OXY IR/ROXICODONE) 5 MG immediate release tablet Take 1 tablet (5 mg total) by mouth every 8 (eight) hours as needed for pain.  30 tablet  0  . Propylene Glycol (SYSTANE BALANCE OP) Place 1 drop into both eyes as needed (dry eyes). Dry eyes      . sevelamer (RENVELA) 800 MG tablet Take 2,400 mg by mouth 3 (three) times daily with meals. 3 tabs by mouth three times a day      . tetrahydrozoline-zinc (VISINE-AC) 0.05-0.25 % ophthalmic solution Place 2 drops into both eyes 3 (three) times daily as needed (for redness).      . cephALEXin (KEFLEX) 250 MG capsule Take 1 capsule (250 mg total) by mouth 2 (two) times daily.  28 capsule  0     Home: Home Living Family/patient expects to be discharged to:: Inpatient rehab Living Arrangements: Alone   Functional History: Prior Function Comments: Pt states she used 4 wheeled walker to ambulate short distances and manual wheelchair for longer distances  Functional Status:  Mobility: Bed Mobility Bed Mobility: Supine to Sit Supine to Sit: 4: Min assist Transfers Transfers: Lateral/Scoot Transfers Lateral/Scoot Transfers: 3: Mod assist;From elevated surface Ambulation/Gait Ambulation/Gait Assistance: Not tested (comment)    ADL:    Cognition: Cognition Overall  Cognitive Status: Within Functional Limits for tasks assessed Orientation Level: Oriented X4 Cognition Arousal/Alertness: Awake/alert Behavior During Therapy: WFL for tasks assessed/performed Overall Cognitive Status: Within Functional Limits for tasks assessed  Physical Exam: Blood pressure 102/68, pulse 99, temperature 98.1 F (36.7 C), temperature source Oral, resp. rate 18, height 5\' 9"  (1.753 m), weight 83.689 kg (184 lb 8 oz), SpO2 95.00%. Nursing note and vitals reviewed.  Constitutional: She is oriented to person, place, and time. She appears well-developed and well-nourished.  HENT: oral mucosa pink and moist. Dentition borderline Head: Normocephalic and atraumatic.  Eyes: Conjunctivae are normal. Pupils are equal, round, and reactive to light.  Neck: Normal range of motion.  Cardiovascular: Normal rate and regular rhythm.  Pulmonary/Chest: Effort normal and breath sounds normal. No respiratory distress. She has no wheezes.  Abdominal: Soft. Bowel sounds are normal. She exhibits no distension.  Musculoskeletal: She exhibits no tenderness.  R-BKA stump well healed. Old callus. L-AKA with VAC still in place. Left stump is appropriately tender. Pt can touch limb, examiner can touch limb however. Leg is edematous.  Neurological: She is alert and oriented to person, place, and time.  Alert and  oriented. No cognitive deficits. No gross sensory loss. UE's are 4+/5 prox to distal.  RLE is 4/5 at hip/knee. Can lift left hip agst gravity.   Skin: Skin is warm and dry.  Psychiatric: She has a normal mood and affect. Her speech is normal and behavior is normal. Judgment and thought content normal. Cognition and memory are normal.     Results for orders placed during the hospital encounter of 03/25/13 (from the past 48 hour(s))  GLUCOSE, CAPILLARY     Status: Abnormal   Collection Time    03/27/13 10:50 AM      Result Value Range   Glucose-Capillary 106 (*) 70 - 99 mg/dL  GLUCOSE, CAPILLARY     Status: None   Collection Time    03/27/13  4:24 PM      Result Value Range   Glucose-Capillary 97  70 - 99 mg/dL  GLUCOSE, CAPILLARY     Status: Abnormal   Collection Time    03/27/13  9:04 PM      Result Value Range   Glucose-Capillary 125 (*) 70 - 99 mg/dL   Comment 1 Documented in Chart     Comment 2 Notify RN    GLUCOSE, CAPILLARY     Status: Abnormal   Collection Time    03/28/13  3:15 AM      Result Value Range   Glucose-Capillary 111 (*) 70 - 99 mg/dL  BASIC METABOLIC PANEL     Status: Abnormal   Collection Time    03/28/13  5:00 AM      Result Value Range   Sodium 133 (*) 135 - 145 mEq/L   Potassium 4.0  3.5 - 5.1 mEq/L   Chloride 94 (*) 96 - 112 mEq/L   CO2 28  19 - 32 mEq/L   Glucose, Bld 106 (*) 70 - 99 mg/dL   BUN 19  6 - 23 mg/dL   Creatinine, Ser 1.61 (*) 0.50 - 1.10 mg/dL   Calcium 9.2  8.4 - 09.6 mg/dL   GFR calc non Af Amer 10 (*) >90 mL/min   GFR calc Af Amer 11 (*) >90 mL/min   Comment:            The eGFR has been calculated     using the CKD EPI equation.  This calculation has not been     validated in all clinical     situations.     eGFR's persistently     <90 mL/min signify     possible Chronic Kidney Disease.  CBC     Status: Abnormal   Collection Time    03/28/13  5:00 AM      Result Value Range   WBC 12.7 (*) 4.0 - 10.5 K/uL   RBC 3.64  (*) 3.87 - 5.11 MIL/uL   Hemoglobin 10.2 (*) 12.0 - 15.0 g/dL   HCT 56.2 (*) 13.0 - 86.5 %   MCV 84.9  78.0 - 100.0 fL   MCH 28.0  26.0 - 34.0 pg   MCHC 33.0  30.0 - 36.0 g/dL   RDW 78.4 (*) 69.6 - 29.5 %   Platelets 174  150 - 400 K/uL  GLUCOSE, CAPILLARY     Status: None   Collection Time    03/28/13 11:18 AM      Result Value Range   Glucose-Capillary 86  70 - 99 mg/dL  GLUCOSE, CAPILLARY     Status: None   Collection Time    03/28/13  5:39 PM      Result Value Range   Glucose-Capillary 91  70 - 99 mg/dL  GLUCOSE, CAPILLARY     Status: None   Collection Time    03/28/13  9:53 PM      Result Value Range   Glucose-Capillary 88  70 - 99 mg/dL   Comment 1 Documented in Chart     Comment 2 Notify RN    BASIC METABOLIC PANEL     Status: Abnormal   Collection Time    03/29/13  5:25 AM      Result Value Range   Sodium 133 (*) 135 - 145 mEq/L   Potassium 4.2  3.5 - 5.1 mEq/L   Chloride 95 (*) 96 - 112 mEq/L   CO2 27  19 - 32 mEq/L   Glucose, Bld 131 (*) 70 - 99 mg/dL   BUN 11  6 - 23 mg/dL   Creatinine, Ser 2.84 (*) 0.50 - 1.10 mg/dL   Calcium 9.0  8.4 - 13.2 mg/dL   GFR calc non Af Amer 14 (*) >90 mL/min   GFR calc Af Amer 16 (*) >90 mL/min   Comment:            The eGFR has been calculated     using the CKD EPI equation.     This calculation has not been     validated in all clinical     situations.     eGFR's persistently     <90 mL/min signify     possible Chronic Kidney Disease.  CBC     Status: Abnormal   Collection Time    03/29/13  5:25 AM      Result Value Range   WBC 14.6 (*) 4.0 - 10.5 K/uL   RBC 3.91  3.87 - 5.11 MIL/uL   Hemoglobin 10.9 (*) 12.0 - 15.0 g/dL   HCT 44.0 (*) 10.2 - 72.5 %   MCV 85.2  78.0 - 100.0 fL   MCH 27.9  26.0 - 34.0 pg   MCHC 32.7  30.0 - 36.0 g/dL   RDW 36.6 (*) 44.0 - 34.7 %   Platelets 194  150 - 400 K/uL   No results found.  Post Admission Physician Evaluation: 1. Functional deficits secondary  to left AKA required due  to  osteomyelitis of the left lower ext. 2. Patient is admitted to receive collaborative, interdisciplinary care between the physiatrist, rehab nursing staff, and therapy team. 3. Patient's level of medical complexity and substantial therapy needs in context of that medical necessity cannot be provided at a lesser intensity of care such as a SNF. 4. Patient has experienced substantial functional loss from his/her baseline which was documented above under the "Functional History" and "Functional Status" headings.  Judging by the patient's diagnosis, physical exam, and functional history, the patient has potential for functional progress which will result in measurable gains while on inpatient rehab.  These gains will be of substantial and practical use upon discharge  in facilitating mobility and self-care at the household level. 5. Physiatrist will provide 24 hour management of medical needs as well as oversight of the therapy plan/treatment and provide guidance as appropriate regarding the interaction of the two. 6. 24 hour rehab nursing will assist with bladder management, bowel management, safety, skin/wound care, disease management, medication administration, pain management and patient education  and help integrate therapy concepts, techniques,education, etc. 7. PT will assess and treat for/with: Lower extremity strength, range of motion, stamina, balance, functional mobility, safety, adaptive techniques and equipment, pre-pros ed, pain mgt,.   Goals are: mod I. 8. OT will assess and treat for/with: ADL's, functional mobility, safety, upper extremity strength, adaptive techniques and equipment, pre-pros ed, pain mgt.   Goals are: mod I. 9. SLP will assess and treat for/with: n/a.  Goals are: n/a. 10. Case Management and Social Worker will assess and treat for psychological issues and discharge planning. 11. Team conference will be held weekly to assess progress toward goals and to determine barriers to  discharge. 12. Patient will receive at least 3 hours of therapy per day at least 5 days per week. 13. ELOS: 7-9 days       14. Prognosis:  excellent   Medical Problem List and Plan: 1. DVT Prophylaxis/Anticoagulation: Pharmaceutical: Lovenox 2. Pain Management: Continues to have significant pain L-AKA. Will monitor and adjust. Premedicated prior to therapies.  3. Mood: seems to be dealing with situation appropriately. Provide ego support. Will have LCSW follow for evaluation/support.  4. Neuropsych: This patient is capable of making decisions on her own behalf. 5. ESRD: HD on M, W, F. Check daily weights with 1200 cc fluid restrictions.  6. DM type 2: Hypoglycemic episodes noted. Will adjust lantus insulin and titrate as needed. Monitor BS with ac/hs cbg checks.  7. CAD with ICM: Asymptomatic. On midodrine due to hypotension. Resume ASA. Did not use plavix at home.  8. Anemia of chronic disease: On aranesp weekly with routine monitoring of labs in dialysis.  9. OSA: Resume CPAP--uses face mask at home. 10. Pseudomonas wound infection: Gentamicin thorough 07/29 for two week antibiotic therapy? Now with AKA and question need for antibiotics if BC negative. Reactive leukocytosis continues.    Ranelle Oyster, MD, [facility] Physical Medicine & Rehabilitation   03/29/2013

## 2013-03-30 NOTE — Progress Notes (Addendum)
ANTIBIOTIC CONSULT NOTE - FOLLOW UP  Pharmacy Consult for Gentamicin Indication: extensive osteomyelitis, now s/p L-AKA  Allergies  Allergen Reactions  . Neurontin (Gabapentin) Anaphylaxis  . Ezetimibe-Simvastatin Other (See Comments)    REACTION: face edema; itching  . Klonopin (Clonazepam) Other (See Comments)    hallucination  . Sertraline Hcl Other (See Comments)    hallucination    Patient Measurements: Weight: 84 kg  Labs:  Recent Labs  03/28/13 0500 03/29/13 0525 03/30/13 0752 03/30/13 0822  WBC 12.7* 14.6*  --  15.1*  HGB 10.2* 10.9*  --  9.8*  PLT 174 194  --  191  CREATININE 4.50* 3.42* 4.61*  --    The CrCl is unknown because both a height and weight (above a minimum accepted value) are required for this calculation.  Recent Labs  03/30/13 0751  GENTRANDOM 3.2     Microbiology: Recent Results (from the past 720 hour(s))  CULTURE, BLOOD (ROUTINE X 2)     Status: None   Collection Time    03/25/13  9:17 AM      Result Value Range Status   Specimen Description BLOOD LEFT ARM   Final   Special Requests BOTTLES DRAWN AEROBIC ONLY 10CC   Final   Culture  Setup Time 03/25/2013 20:46   Final   Culture     Final   Value:        BLOOD CULTURE RECEIVED NO GROWTH TO DATE CULTURE WILL BE HELD FOR 5 DAYS BEFORE ISSUING A FINAL NEGATIVE REPORT   Report Status PENDING   Incomplete  CULTURE, BLOOD (ROUTINE X 2)     Status: None   Collection Time    03/25/13  9:22 AM      Result Value Range Status   Specimen Description BLOOD LEFT HAND   Final   Special Requests BOTTLES DRAWN AEROBIC ONLY 3CC   Final   Culture  Setup Time 03/25/2013 20:46   Final   Culture     Final   Value:        BLOOD CULTURE RECEIVED NO GROWTH TO DATE CULTURE WILL BE HELD FOR 5 DAYS BEFORE ISSUING A FINAL NEGATIVE REPORT   Report Status PENDING   Incomplete  WOUND CULTURE     Status: None   Collection Time    03/26/13  1:39 PM      Result Value Range Status   Specimen Description WOUND  LEFT FOOT   Final   Special Requests LEFT HEEL   Final   Gram Stain     Final   Value: NO WBC SEEN     NO SQUAMOUS EPITHELIAL CELLS SEEN     NO ORGANISMS SEEN   Culture ABUNDANT PSEUDOMONAS AERUGINOSA   Final   Report Status 03/30/2013 FINAL   Final   Organism ID, Bacteria PSEUDOMONAS AERUGINOSA   Final  SURGICAL PCR SCREEN     Status: None   Collection Time    03/27/13  1:25 AM      Result Value Range Status   MRSA, PCR NEGATIVE  NEGATIVE Final   Staphylococcus aureus NEGATIVE  NEGATIVE Final   Comment:            The Xpert SA Assay (FDA     approved for NASAL specimens     in patients over 64 years of age),     is one component of     a comprehensive surveillance     program.  Test performance has  been validated by [facility] for patients greater     than or equal to 69 year old.     It is not intended     to diagnose infection nor to     guide or monitor treatment.    Anti-infectives   Start     Dose/Rate Route Frequency Ordered Stop   04/02/13 1200  gentamicin (GARAMYCIN) 140 mg in dextrose 5 % 50 mL IVPB     140 mg 107 mL/hr over 30 Minutes Intravenous Every M-W-F (Hemodialysis) 03/30/13 1643     03/30/13 1700  gentamicin (GARAMYCIN) 140 mg in dextrose 5 % 50 mL IVPB     140 mg 107 mL/hr over 30 Minutes Intravenous  Once 03/30/13 1631        Assessment: 57yo female with extensive osteomyelitis, wound cx (+) pseudomonas and sensitive to gentamicin.  Pre-HD gent level was 3.2 today expected clearance of drug to be 20% per hour with complete HD session.  HD x 4hr today at BFR 400; appropriate to redose.  Admitted to rehab this afternoon. Dose was not given after HD today or on the inpatient floor (confirmed). Reschedule for now. Gentamicin started on 7/16.  Goal of Therapy:  Pre-HD gentamicin level <4 mcg/ml with normal HD session  Plan:  Gentamicin 140mg  IV x1 now then 140 mg after HD on MWF for 2 weeks total (through 04/10/13)  Loura Back,  Pharm.D., BCPS Clinical Pharmacist Pager: [phone] 03/30/2013 4:46 PM

## 2013-03-30 NOTE — Procedures (Signed)
I was present at this session.  I have reviewed the session itself and made appropriate changes.  LUA AVF, access press ok. olc off. bp & vol ok.  [name] L 7/18/20148:05 AM

## 2013-03-30 NOTE — Progress Notes (Signed)
VASCULAR PROGRESS NOTE  SUBJECTIVE: Comfortable.  PHYSICAL EXAM: Filed Vitals:   03/29/13 1728 03/29/13 2041 03/30/13 0447 03/30/13 0720  BP: 115/66 115/61 113/59 114/66  Pulse: 94 100 100 96  Temp: 98.6 F (37 C) 98.8 F (37.1 C) 98.7 F (37.1 C) 98.3 F (36.8 C)  TempSrc: Oral Oral Oral Oral  Resp: 18 18 18 18   Height:  5\' 9"  (1.753 m)    Weight:  184 lb 8 oz (83.689 kg)  185 lb 3 oz (84 kg)  SpO2: 100% 99% 98% 98%   VAC with good seal.   LABS: Lab Results  Component Value Date   WBC 14.6* 03/29/2013   HGB 10.9* 03/29/2013   HCT 33.3* 03/29/2013   MCV 85.2 03/29/2013   PLT 194 03/29/2013   Lab Results  Component Value Date   CREATININE 3.42* 03/29/2013   Lab Results  Component Value Date   INR 1.29 02/19/2013   CBG (last 3)   Recent Labs  03/29/13 1741 03/29/13 2043 03/30/13 0612  GLUCAP 100* 95 70    Active Problems:   HYPERLIPIDEMIA   End stage renal disease   Cardiomyopathy, ischemic   Atherosclerotic PVD with ulceration   Acute osteomyelitis of foot   Coronary atherosclerosis of native coronary artery   Sepsis   Anemia of chronic renal failure   Secondary hyperparathyroidism   ASSESSMENT AND PLAN: 1. 3 Days Post-Op s/p: L AKA 2. Ok for transfer to CIR from my standpoint.  3. D/C VAC after HD today or tomorrow.   Cari Caraway Beeper: [phone] 03/30/2013

## 2013-03-30 NOTE — Plan of Care (Addendum)
Overall Plan of Care [facility]) Patient Details Name: [name] MRN: [id number] DOB: 03-Aug-1956  Diagnosis:  Left AKA  Co-morbidities: ESRD, DM, CAD, OSA  Functional Problem List  Patient demonstrates impairments in the following areas: Balance, Endurance, Medication Management, Motor, Pain and Skin Integrity  Basic ADL's: bathing, dressing and toileting Advanced ADL's: simple meal preparation  Transfers:  bed mobility, bed to chair, toilet and tub/shower Locomotion:  ambulation and wheelchair mobility  Additional Impairments:  Leisure Awareness  Anticipated Outcomes Item Anticipated Outcome  Eating/Swallowing    Basic self-care  Mod I from w/c level  Tolieting  Mod I  Bowel/Bladder  Pt anuric continent of bowel  Transfers  Mod I sliding board  Locomotion  Primarily WC Mod I, but aiming for short distance ambulation with RW and prosthesis Min A   Communication    Cognition    Pain  Pt will rate pain less than 3 on scale of 0-10  Safety/Judgment  Pt will remain free from falls during admission  Other     Therapy Plan: PT Intensity: Minimum of 1-2 x/day ,45 to 90 minutes PT Frequency: 5 out of 7 days PT Duration Estimated Length of Stay: 10-12 days OT Intensity: Minimum of 1-2 x/day, 45 to 90 minutes OT Frequency: 5 out of 7 days OT Duration/Estimated Length of Stay: 10-12 days      Team Interventions: Item RN PT OT SLP SW TR Other  Self Care/Advanced ADL Retraining  x x      Neuromuscular Re-Education  x       Therapeutic Activities  x x   x   UE/LE Strength Training/ROM  x x   x   UE/LE Coordination Activities         Visual/Perceptual Remediation/Compensation         DME/Adaptive Equipment Instruction  x x   x   Therapeutic Exercise  x x   x   Balance/Vestibular Training  x x   x   Patient/Family Education  x x   x   Cognitive Remediation/Compensation         Functional Mobility Training  x x   x   Ambulation/Gait Training  x       Advertising account planner Propulsion/Positioning  x    x   Functional Tourist information centre manager Reintegration  x    x   Dysphagia/Aspiration Film/video editor         Bladder Management         Bowel Management         Disease Management/Prevention  x       Pain Management x x    x   Medication Management         Skin Care/Wound Management x x       Splinting/Orthotics  x       Discharge Planning  x    x   Psychosocial Support x x x   x                          Team Discharge Planning: Destination: PT-Home ,OT- Home , SLP-  Projected Follow-up: PT-Home health PT;24 hour supervision/assistance, OT-  Home health OT, SLP-  Projected Equipment Needs: PT-Wheelchair (measurements);Wheelchair cushion (measurements);Rolling walker with 5" wheels;Other (comment);Sliding board (TBD pending progress and  condition of current equipment), OT-  , SLP-  Patient/family involved in discharge planning: PT- Patient,  OT-Patient, SLP-   MD ELOS: 10-12 days Medical Rehab Prognosis:  Excellent Assessment: The patient has been admitted for CIR therapies. The team will be addressing, functional mobility, strength, stamina, balance, safety, adaptive techniques/equipment, self-care, bowel and bladder mgt, patient and caregiver education, pre-pros ed, pain mgt, wound care. Goals have been set at mod I at a wheelchair primarily.    Ranelle Oyster, MD, FAAPMR      See Team Conference Notes for weekly updates to the plan of care

## 2013-03-30 NOTE — Discharge Summary (Signed)
Physician Discharge Summary  [name] ZOX:[id number] DOB: [birth year] DOA: 03/25/2013  PCP: No primary provider on file.  Admit date: 03/25/2013 Discharge date: 03/30/2013  Recommendations for Outpatient Follow-up:  1. To CIR for ongoing PT and OT 2. Vascular surgery to remove Wound Vac 7/18 or 7/19 3. Follow up with Dr. Edilia Bo in 4 weeks as outpatient for reevaluation, call to schedule.    Discharge Diagnoses:  Active Problems:   HYPERLIPIDEMIA   End stage renal disease   Cardiomyopathy, ischemic   Atherosclerotic PVD with ulceration   Acute osteomyelitis of foot   Coronary atherosclerosis of native coronary artery   Sepsis   Anemia of chronic renal failure   Secondary hyperparathyroidism   Discharge Condition: stable, improved  Diet recommendation: renal dialysis  Wt Readings from Last 3 Encounters:  03/30/13 84 kg (185 lb 3 oz)  03/30/13 84 kg (185 lb 3 oz)  03/07/13 94.62 kg (208 lb 9.6 oz)    History of present illness:   57 year old female with a history of ischemic cardiomyopathy (EF 45-50%), ESRD, diabetes mellitus, peripheral vascular disease presents with one to two-week history of generalized weakness. The patient states that she was trying to transfer to her wheelchair early this morning and fell onto the floor during the transfer. She did not injure herself. Upon evaluation in ED, the patient was found to have a left heel ulcer that had necrotic material and foul odor. X-ray of the left heel suggested osteomyelitis with a portion of bone avulsion. The patient denied any fevers, chills, chest discomfort, shortness of breath, vomiting, diarrhea, abdominal pain. 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 The patient states that the ulcer on her left calcaneal area started in May 2014 and gradually got worse. She underwent a left femoral-popliteal bypass performed by Dr. Edilia Bo on 02/20/2013. The left calcaneal wound has not  significantly improved since that period of time. This past week, the patient was referred to go to hyperbaric oxygen therapy. Patient believes that she may also be receiving some type of IV antibiotics on dialysis on her last 2 dialysis sessions. She does not know the name of the antibiotic. In addition, the patient has been taking cephalexin 250 mg twice a day since discharge from the hospital from her femoral-popliteal bypass. She receives dialysis through a right upper extremity fistula on Monday, Wednesday, Fridays.   In the ED, the patient was hemodynamically stable. Lactic acid was 1.83. WBC 13.9. BMP showed sodium 133 and serum creatinine 4.27. Sedimentation rate was 62.  Hospital Course:   Bacteremia with Morganella morganii and Pseudomonas aeruginosa from 7/9 blood cultures, source likely the left foot ulcer. - Blood cultures from 7/13 NGTD  - Was placed empirically on vanc and zosyn, however, the PSA was resistant to zosyn so transitioned to gentamicin which can be administered in HD. - Treat for 14 days with gentamicin, day 1 on 7/16, last day on 7/29.  -  Monitor for signs of developing C. 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 Edilia Bo.  Culture grew PSA sensitive to gent and FQ.  Postoperatively, she had a wound vacuum placed which maintained a good seal.  The swelling and pain decreased.  Anticipate wound VAC removal by vascular surgery on 7/18 or 7/19.  Vascular surgery will continue to follow.    Peripheral vascular disease with left lower extremity ulcerations  -Status post left femoral  popliteal bypass 02/20/2013 and left AKA 7/15  -continue Oxycodone 5 mg every 6 hours when necessary pain   Ischemic cardiomyopathy EF 45-50%, grade 1 DD  -Continue aspirin and Plavix  - Not on BB or ACEI due to hypotension, particularly with HD   Diabetes mellitus type 2, blood glucose low this morning, although previously  had been well controlled.  May be due to not eating as she normally would and changes to her basal insulin requirement from infection. -  Hold lantus for now - Continue NovoLog sliding scale   ESRD with HD on M,W, Fr.  She takes Midrin 10 mg Monday, Wednesday, Fridays before dialysis.  Hyperlipidemia stable.  -Continued Lipitor   Leukocytosis, rising.  Patient feels well and remains afebrile, however.  Most likely reactive from recent surgery, however, ddx includes abscess, retained object, or new infection with organism such as MRSA or C. Diff. -  Monitor clinically for evidence of new infection -  Trend CBC  Anemia of renal parenchymal disease.  Per nephrology.  Consultants:  Vascular- Dr Darrick Penna, Dr. Edilia Bo  Nephrology, Dr. Darrick Penna Procedures:  L. AKA 7/15 Antibiotics:  Vanc and zosyn 7/13 >> 7/16  Gentamicin 7/16 >> (anticipated end date of 7/29)   Discharge Exam: Filed Vitals:   03/30/13 1242  BP: 118/60  Pulse: 75  Temp: 98.6 F (37 C)  Resp: 18   Filed Vitals:   03/30/13 1130 03/30/13 1145 03/30/13 1159 03/30/13 1242  BP: 105/63 110/61 111/58 118/60  Pulse: 95 94 94 75  Temp:  98 F (36.7 C)  98.6 F (37 C)  TempSrc:  Oral  Oral  Resp:  18  18  Height:      Weight:      SpO2:  96%  96%   States she feels well.  Denies fevers, chills, severe pain in the lower extremity.  General: African American female, alert & oriented x 3, NAD, seen in HD today HEENT: NCAT, MMM  Cardiovascular: RRR, nl S1 s2 Respiratory: CTAB  Abdomen: soft +BS NT/ND, no masses palpable  Extremities: R. BKA, L.AKA with wound vac extending laterally along distal portion with good seal. Leg is not warm, no erythema, mild swelling inferiorly which appears to be improving   Discharge Instructions       Future Appointments Provider Department Dept Phone   04/12/2013 9:00 AM York Spaniel, MD [facility] [phone]   04/25/2013 10:15 AM Chuck Hint,  MD [facility] [phone]   05/07/2013 12:45 PM Delcie Roch [facility] [phone]   05/17/2013 2:00 PM [facility] Covering Provider [facility] [place] [facility] [phone]   05/29/2013 10:00 AM Waymon Budge, MD [facility] [phone]   10/10/2013 1:00 PM [facility] [phone]   10/10/2013 1:30 PM Chuck Hint, MD [facility] [phone]       Medication List    STOP taking these medications       cephALEXin 250 MG capsule  Commonly known as:  KEFLEX     insulin glargine 100 UNIT/ML injection  Commonly known as:  LANTUS      TAKE these medications       acetaminophen 325 MG tablet  Commonly known as:  TYLENOL  Take 650 mg by mouth every 6 (six) hours as needed for pain.     atorvastatin 10 MG tablet  Commonly known as:  LIPITOR  Take 10 mg by mouth daily before breakfast.     B-complex with vitamin C tablet  Take 1 tablet by mouth daily.     cinacalcet 60 MG tablet  Commonly known as:  SENSIPAR  Take 60 mg by mouth daily before breakfast.     clopidogrel 75 MG tablet  Commonly known as:  PLAVIX  Take 1 tablet (75 mg total) by mouth daily.     collagenase ointment  Commonly known as:  SANTYL  Apply 1 application topically every other day.     DSS 100 MG Caps  Take 100 mg by mouth daily.     feeding supplement (NEPRO CARB STEADY) Liqd  Take 237 mLs by mouth 2 (two) times daily between meals.     ibuprofen 600 MG tablet  Commonly known as:  ADVIL,MOTRIN  Take 600 mg by mouth every 6 (six) hours as needed for pain.     insulin aspart 100 UNIT/ML injection  Commonly known as:  novoLOG  Inject 3-16 Units into the skin 3 (three) times daily before meals. Sliding scale     midodrine 10 MG tablet  Commonly known as:  PROAMATINE  Take 10 mg by mouth 3 (three) times a week. Take on  Monday, Wednesday, and Friday     oxyCODONE 5 MG immediate release tablet  Commonly known as:  Oxy IR/ROXICODONE  Take 1 tablet (5 mg total) by mouth every 8 (eight) hours as needed for pain.     pantoprazole 40 MG tablet  Commonly known as:  PROTONIX  Take 1 tablet (40 mg total) by mouth daily.     sevelamer carbonate 800 MG tablet  Commonly known as:  RENVELA  Take 2,400 mg by mouth 3 (three) times daily with meals. 3 tabs by mouth three times a day     SYSTANE BALANCE OP  Place 1 drop into both eyes as needed (dry eyes). Dry eyes     tetrahydrozoline-zinc 0.05-0.25 % ophthalmic solution  Commonly known as:  VISINE-AC  Place 2 drops into both eyes 3 (three) times daily as needed (for redness).       Follow-up Information   Follow up with DICKSON,CHRISTOPHER S, MD In 4 weeks. (OFFICE WILL ARRANGE)    Contact information:   [address] [place] [address] [phone]        The results of significant diagnostics from this hospitalization (including imaging, microbiology, ancillary and laboratory) are listed below for reference.    Significant Diagnostic Studies: Dg Foot 2 Views Left  03/25/2013   *RADIOLOGY REPORT*  Clinical Data: Draining wound of the posterior left heel.  LEFT FOOT - 2 VIEW  Comparison: 02/02/2013  Findings: There is separation of bone with irregular cortical margins at the level of the posterior calcaneus.  Visible soft tissue wound is present overlying this region and findings are consistent with osteomyelitis and associated avulsion of bone at the insertion of the Achilles tendon.  Diffuse diabetic related calcifications are seen in the arteries of the foot.  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 Soft tissue wound is evident in this region.   Original Report Authenticated By: Irish Lack, M.D.    Microbiology: Recent Results (from the past 240  hour(s))  CULTURE, BLOOD (ROUTINE X 2)     Status: None   Collection Time    03/25/13  9:17 AM      Result Value Range  Status   Specimen Description BLOOD LEFT ARM   Final   Special Requests BOTTLES DRAWN AEROBIC ONLY 10CC   Final   Culture  Setup Time 03/25/2013 20:46   Final   Culture     Final   Value:        BLOOD CULTURE RECEIVED NO GROWTH TO DATE CULTURE WILL BE HELD FOR 5 DAYS BEFORE ISSUING A FINAL NEGATIVE REPORT   Report Status PENDING   Incomplete  CULTURE, BLOOD (ROUTINE X 2)     Status: None   Collection Time    03/25/13  9:22 AM      Result Value Range Status   Specimen Description BLOOD LEFT HAND   Final   Special Requests BOTTLES DRAWN AEROBIC ONLY 3CC   Final   Culture  Setup Time 03/25/2013 20:46   Final   Culture     Final   Value:        BLOOD CULTURE RECEIVED NO GROWTH TO DATE CULTURE WILL BE HELD FOR 5 DAYS BEFORE ISSUING A FINAL NEGATIVE REPORT   Report Status PENDING   Incomplete  WOUND CULTURE     Status: None   Collection Time    03/26/13  1:39 PM      Result Value Range Status   Specimen Description WOUND LEFT FOOT   Final   Special Requests LEFT HEEL   Final   Gram Stain     Final   Value: NO WBC SEEN     NO SQUAMOUS EPITHELIAL CELLS SEEN     NO ORGANISMS SEEN   Culture ABUNDANT PSEUDOMONAS AERUGINOSA   Final   Report Status 03/30/2013 FINAL   Final   Organism ID, Bacteria PSEUDOMONAS AERUGINOSA   Final  SURGICAL PCR SCREEN     Status: None   Collection Time    03/27/13  1:25 AM      Result Value Range Status   MRSA, PCR NEGATIVE  NEGATIVE Final   Staphylococcus aureus NEGATIVE  NEGATIVE Final   Comment:            The Xpert SA Assay (FDA     approved for NASAL specimens     in patients over 7 years of age),     is one component of     a comprehensive surveillance     program.  Test performance has     been validated by [employer] for patients greater     than or equal to 63 year old.     It is not intended     to diagnose infection  nor to     guide or monitor treatment.     Labs: Basic Metabolic Panel:  Recent Labs Lab 03/25/13 0916 03/26/13 0430 03/27/13 0605 03/28/13 0500 03/29/13 0525 03/30/13 0752  NA 133* 134* 134* 133* 133* 130*  K 3.8 4.6 3.5 4.0 4.2 4.6  CL 91* 93* 93* 94* 95* 94*  CO2 31 31 30 28 27 24   GLUCOSE 125* 86 88 106* 131* 65*  BUN 23 27* 13 19 11 16   CREATININE 4.27* 5.22* 3.68* 4.50* 3.42* 4.61*  CALCIUM 9.9 9.7 9.4 9.2 9.0 8.9  MG 2.2  --   --   --   --   --   PHOS 2.9  --   --   --   --  5.0*   Liver Function Tests:  Recent Labs Lab 03/26/13 0430 03/30/13 0752  AST 18  --  ALT 15  --   ALKPHOS 108  --   BILITOT 0.6  --   PROT 7.1  --   ALBUMIN 1.9* 1.8*   No results found for this basename: LIPASE, AMYLASE,  in the last 168 hours No results found for this basename: AMMONIA,  in the last 168 hours CBC:  Recent Labs Lab 03/25/13 0916 03/27/13 0605 03/28/13 0500 03/29/13 0525 03/30/13 0822  WBC 13.9* 11.1* 12.7* 14.6* 15.1*  NEUTROABS 11.4*  --   --   --   --   HGB 11.8* 10.3* 10.2* 10.9* 9.8*  HCT 35.2* 32.7* 30.9* 33.3* 30.0*  MCV 84.0 85.2 84.9 85.2 84.7  PLT 142* 172 174 194 191   Cardiac Enzymes:  Recent Labs Lab 03/25/13 0917  TROPONINI <0.30   BNP: BNP (last 3 results) No results found for this basename: PROBNP,  in the last 8760 hours CBG:  Recent Labs Lab 03/29/13 1117 03/29/13 1741 03/29/13 2043 03/30/13 0612 03/30/13 1240  GLUCAP 139* 100* 95 70 58*    Time coordinating discharge: 45 minutes  Signed:  Yasira Engelson  [facility] 03/30/2013, 1:27 PM

## 2013-03-30 NOTE — Progress Notes (Signed)
Report given to nurse at [facility].

## 2013-03-30 NOTE — Interval H&P Note (Signed)
[name] was admitted today to Inpatient Rehabilitation with the diagnosis of left AKA.  The patient's history has been reviewed, patient examined, and there is no change in status.  Patient continues to be appropriate for intensive inpatient rehabilitation.  I have reviewed the patient's chart and labs.  Questions were answered to the patient's satisfaction.  Holli Rengel T 03/30/2013, 7:18 PM

## 2013-03-30 NOTE — Progress Notes (Signed)
Pt admitted to 4W25, pt alert and oriented, pt oriented to room, rehab schedule, safety plan, and call bell, pt verbalized an understanding, pt resting in bed with call bell in reach

## 2013-03-30 NOTE — Progress Notes (Signed)
Spoke with patient about wearing cpap and patient states she has stopped wearing cpap over two months ago and does not want to wear one here. I explained to pt that Respiratory is always available if she changes her mind. VS stable.

## 2013-03-30 NOTE — Progress Notes (Signed)
Hypoglycemic Event  CBG: 58  Treatment: 15 GM carbohydrate snack  Symptoms: Shaky  Follow-up CBG: Time: 13:37 CBG Result: 80  Possible Reasons for Event: Inadequate meal intake  Comments/MD notified: MD Short notified    SCHAUER, Magali Bray L  Remember to initiate Hypoglycemia Order Set & complete

## 2013-03-31 ENCOUNTER — Inpatient Hospital Stay (HOSPITAL_COMMUNITY): Payer: Medicare Other | Admitting: *Deleted

## 2013-03-31 ENCOUNTER — Inpatient Hospital Stay (HOSPITAL_COMMUNITY): Payer: Medicare Other | Admitting: Occupational Therapy

## 2013-03-31 DIAGNOSIS — L98499 Non-pressure chronic ulcer of skin of other sites with unspecified severity: Secondary | ICD-10-CM

## 2013-03-31 DIAGNOSIS — I739 Peripheral vascular disease, unspecified: Secondary | ICD-10-CM

## 2013-03-31 DIAGNOSIS — S78119A Complete traumatic amputation at level between unspecified hip and knee, initial encounter: Secondary | ICD-10-CM

## 2013-03-31 DIAGNOSIS — N186 End stage renal disease: Secondary | ICD-10-CM

## 2013-03-31 LAB — CULTURE, BLOOD (ROUTINE X 2): Culture: NO GROWTH

## 2013-03-31 LAB — GLUCOSE, CAPILLARY: Glucose-Capillary: 99 mg/dL (ref 70–99)

## 2013-03-31 MED ORDER — GENTAMICIN SULFATE 40 MG/ML IJ SOLN
160.0000 mg | INTRAVENOUS | Status: DC
  Administered 2013-04-03 – 2013-04-04 (×2): 160 mg via INTRAVENOUS
  Filled 2013-03-31 (×3): qty 4

## 2013-03-31 NOTE — Evaluation (Signed)
Physical Therapy Assessment and Plan  Patient Details  Name: [name] MRN: [id number] Date of Birth: [birth year]  PT Diagnosis: Difficulty walking, Impaired sensation and Muscle weakness Rehab Potential: Good ELOS: 10-12 days   Today's Date: 03/31/2013 Time: 1305-1405 and 13:25-14:00 Time Calculation (min): 60 min and - 10 min missed due to pt fatigue  Problem List:  Patient Active Problem List   Diagnosis Date Noted  . Unilateral AKA--left 03/30/2013  . Wound dehiscence, surgical--left groin 03/30/2013  . Leucocytosis 03/30/2013  . Hx of right BKA 03/30/2013  . Coronary atherosclerosis of native coronary artery 03/28/2013  . Sepsis 03/28/2013  . Anemia of chronic renal failure 03/28/2013  . Secondary hyperparathyroidism 03/28/2013  . Acute osteomyelitis of foot 03/25/2013  . Atherosclerotic PVD with ulceration 02/20/2013  . CVA (cerebral infarction) 02/12/2013  . Cardiomyopathy, ischemic 02/03/2013  . DM (diabetes mellitus), secondary, w/peripheral vascular complications 02/03/2013  . Pain in limb 01/31/2013  . PVD (peripheral vascular disease) 10/04/2012  . MGUS (monoclonal gammopathy of unknown significance) 07/11/2012  . Infected sebaceous cyst of Right posterior neck. 09/30/2011  . HYPERLIPIDEMIA 07/28/2010  . OBSTRUCTIVE SLEEP APNEA 07/27/2010  . ABNORMAL HEART RHYTHMS 07/27/2010  . End stage renal disease 07/27/2010    Past Medical History:  Past Medical History  Diagnosis Date  . Diabetes mellitus, type 2   . Secondary hyperparathyroidism   . OSA (obstructive sleep apnea)     NPSG 11.23.11-AHI 64.9/hr-central and obstructive apnea  . Ischemic cardiomyopathy     EF 25-30% 2011  . CAD (coronary artery disease)   . Pulmonary hypertension     Est PAsyst 60-Dr Mendel Ryder, off coumadin, CT neg for PE  . Hyperlipidemia   . Anemia   . Thyroid disease   . Pulmonary edema   . Peripheral vascular disease   . Sebaceous cyst   . Systemic lupus 08/08/07     Dr. Velvet Bathe  . Irregular heart beat   . Shortness of breath     with exertion  . Hemodialysis patient     M,W,F  . TIA (transient ischemic attack) June 2014  . Ulcer of heel due to diabetes     left  . Kidney disease Dialysis MWF    ESRD secondary to DM, failed transplant 2006. Dialysis   Past Surgical History:  Past Surgical History  Procedure Laterality Date  . Coronary artery bypass graft  2011    3V  . Insertion of dialysis catheter    . 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 Nasal mucosal biopsy    . Ovarian cyst surgery    . 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 Laser retinal surgery  2011  . Kidney transplant      rejection  . Abdominal angiogram Left 01/25/13  . Colonoscopy    . Cardiac catheterization    . Eye surgery    . Femoral-popliteal bypass graft Left 02/20/2013    Procedure:  FEMORAL-to below knee POPLITEAL ARTERY bypasswith saphenous vein with intraoperative angiogram ;  Surgeon: Chuck Hint, MD;  Location: [facility] OR;  Service: Vascular;  Laterality: Left;  fem-below knee pop  . Amputation Left 03/27/2013    Procedure: AMPUTATION ABOVE KNEE;  Surgeon: Chuck Hint, MD;  Location: [facility] OR;  Service: Vascular;  Laterality: Left;    Assessment & Plan Clinical Impression: [name] is a 57 y.o. female with history of SLE, DM type 2,  ESRD--failed transplant, PVD-s/p R BKA with chronic ulcers left heel and and left thigh wound s/p L-FPBG. She's well known to rehab from prior stays past R-BKA and CABG and was independent with use of R-BK prosthesis PTA. She was admitted on 03/15/13 past Fall and reports of 1-2 week history of weakness. Left heel ulcer with necrotic material and foul odor and xrays done revealed osteomyelitis with bone avulsion. She was started on IV antibiotics and vascular felt that foot was not salvageable. On 07/15, patient underwent L-AKA with placement of VAC by  Dr. Edilia Bo. Wound culture positive for Pseudomonas and antibiotics changed to gentamicin on 07/16 with recommendations for 2 weeks treatment. Blood cultures pending. Therapies initiated and patient deconditioned with impairments due to AKA. Therapy team recommending CIR. Patient transferred to CIR on 03/30/2013 .   Patient currently requires mod with mobility secondary to muscle weakness and decreased sitting balance.  Prior to hospitalization, patient was modified independent  with mobility and lived with Alone in a Apartment home.  Home access is  Level entry.  Patient will benefit from skilled PT intervention to maximize safe functional mobility, minimize fall risk and decrease caregiver burden for planned discharge home with intermittent assist.  Anticipate patient will benefit from follow up [facility] at discharge.  PT - End of Session Activity Tolerance: Tolerates 10 - 20 min activity with multiple rests Endurance Deficit: Yes Endurance Deficit Description: pt needs frequent rest breaks PT Assessment Rehab Potential: Good PT Plan PT Intensity: Minimum of 1-2 x/day ,45 to 90 minutes PT Frequency: 5 out of 7 days PT Duration Estimated Length of Stay: 10-12 days PT Treatment/Interventions: Ambulation/gait training;Balance/vestibular training;Community reintegration;Discharge planning;Disease management/prevention;DME/adaptive equipment instruction;Neuromuscular re-education;Functional mobility training;Pain management;Patient/family education;Psychosocial support;Skin care/wound management;Splinting/orthotics;Therapeutic Activities;Therapeutic Exercise;UE/LE Strength taining/ROM;Wheelchair propulsion/positioning PT Recommendation Follow Up Recommendations: Home health PT;24 hour supervision/assistance Patient destination: Home Equipment Recommended: Wheelchair (measurements);Wheelchair cushion (measurements);Rolling walker with 5" wheels;Other (comment);Sliding board (TBD pending progress and  condition of current equipment)  Skilled Therapeutic Intervention First session Tx initated this session with transfer training and functional mobility. PT able to long-sit in bed, needing Mod A to scoot to EOB at this time due to LLE phantom pain. Demonstrate technique and board management for sliding board transfers, including head-hips relationship. Performed bed>WC and WC<>mat sliding board transfers with Mod>>Min A with increased time required with cues for hand placement and efficiency. Pt guarded by decreased BOS without lower portion of LLE. Discussed techniques for decreasing phantom pain including theory behind tactile stimulation. Discussed other options, such as mirror and visualization, but did not perform them at this time.  Instructed pt in sit<>forarm lateral leans to increase trunk control in preparation for sliding board placement x5 each side with increased effort and fatigue due to weakness.   Second Session Pt very fatigued this session, requesting me to come back later, unable to keep her eyes opened.  Second attempt tx focused on dynamic sitting balance and therex for strengthening and activity tolerance.  Instructed pt in diagonals with blue weighted ball 2x10 in each direction. Reaching and placing cones outside BOS in all directions.  Supine, prone, and sidelying therex including each of the following bil 2x10 with cues for techniqe:  - SLR, glute sets - sidelying hip ABD and hip ext  - prone lying for increased hip ext x17min   Rewrapped residual limb with ACE as it was falling off.    PT Evaluation Precautions/Restrictions Precautions Precautions: Fall Precaution Comments: new L AKA, old R BKA,  Restrictions Weight  Bearing Restrictions: Yes LLE Weight Bearing: Non weight bearing General   Vital Signs  Pain Pain Assessment Pain Score: 0-No pain Faces Pain Scale: No hurt Home Living/Prior Functioning Home Living Available Help at Discharge: Family;Available  PRN/intermittently Type of Home: Apartment Home Access: Level entry Home Layout: One level Additional Comments: home is handicap accessible  Lives With: Alone Prior Function Level of Independence: Independent with basic ADLs;Requires assistive device for independence  Able to Take Stairs?: No Driving: Yes Vocation: Retired Printmaker) Leisure:  (Member of [employer] group (Helping amputes help amputees) ) Comments: Pt states she used 4 wheeled walker to ambulate short distances and manual wheelchair for longer distances Vision/Perception  Vision - History Baseline Vision: Wears glasses only for reading Patient Visual Report: No change from baseline Vision - Assessment Eye Alignment: Within Functional Limits Perception Perception: Within Functional Limits Praxis Praxis: Intact  Cognition Overall Cognitive Status: Within Functional Limits for tasks assessed Arousal/Alertness: Awake/alert Orientation Level: Oriented X4 Sensation Sensation Light Touch: Appears Intact Stereognosis: Appears Intact Hot/Cold: Appears Intact Additional Comments: Phantom pain in LLE Coordination Gross Motor Movements are Fluid and Coordinated: Yes Fine Motor Movements are Fluid and Coordinated: Yes Motor  Motor Motor: Within Functional Limits Motor - Skilled Clinical Observations: Generalized muscle weakness  Mobility Bed Mobility Supine to Sit: 4: Min assist Supine to Sit Details (indicate cue type and reason): Trunk lifting assist Locomotion  Ambulation Ambulation: No Stairs / Additional Locomotion Stairs: No Wheelchair Mobility Wheelchair Mobility: Yes Wheelchair Assistance: 5: Investment banker, operational: Both upper extremities Wheelchair Parts Management: Needs assistance Distance: 150  Trunk/Postural Assessment  Cervical Assessment Cervical Assessment: Within Functional Limits Thoracic Assessment Thoracic Assessment: Within Functional Limits Lumbar Assessment Lumbar Assessment:  Within Functional Limits Postural Control Postural Control: Deficits on evaluation Trunk Control: Pt with decreased trunk control due to decreased BOS without LLE for support. Pt needing to use UEs to maintain upright sitting on compliant surface  Balance Balance Balance Assessed: Yes Static Sitting Balance Static Sitting - Balance Support: Right upper extremity supported;Left upper extremity supported;Feet unsupported Static Sitting - Level of Assistance: 5: Stand by assistance Static Sitting - Comment/# of Minutes: Sitting EOB, needing 1 UE to stay steady  Dynamic Sitting Balance Dynamic Sitting - Balance Support: Left upper extremity supported;Right upper extremity supported;Feet unsupported Dynamic Sitting - Level of Assistance: 5: Stand by assistance Extremity Assessment  RUE Assessment RUE Assessment: Within Functional Limits LUE Assessment LUE Assessment: Within Functional Limits RLE Assessment RLE Assessment: Within Functional Limits (4/5) LLE Assessment LLE Assessment: Not tested (Due to pain and surgery)  FIM:  FIM - Locomotion: Wheelchair Distance: 150   Refer to Care Plan for Long Term Goals  Recommendations for other services: None  Discharge Criteria: Patient will be discharged from PT if patient refuses treatment 3 consecutive times without medical reason, if treatment goals not met, if there is a change in medical status, if patient makes no progress towards goals or if patient is discharged from hospital.  The above assessment, treatment plan, treatment alternatives and goals were discussed and mutually agreed upon: by patient  Virl Cagey 03/31/2013, 2:37 PM

## 2013-03-31 NOTE — Progress Notes (Addendum)
Subjective/Complaints: No complaints. Slept well. Vasc surgery just removed vac  Objective: Vital Signs: Blood pressure 98/55, pulse 118, temperature 98.3 F (36.8 C), temperature source Oral, resp. rate 16, weight 83.6 kg (184 lb 4.9 oz), SpO2 98.00%. No results found.  Recent Labs  03/29/13 0525 03/30/13 0822  WBC 14.6* 15.1*  HGB 10.9* 9.8*  HCT 33.3* 30.0*  PLT 194 191    Recent Labs  03/29/13 0525 03/30/13 0752  NA 133* 130*  K 4.2 4.6  CL 95* 94*  GLUCOSE 131* 65*  BUN 11 16  CREATININE 3.42* 4.61*  CALCIUM 9.0 8.9   CBG (last 3)   Recent Labs  03/30/13 1652 03/30/13 2101 03/31/13 0740  GLUCAP 82 78 70    Wt Readings from Last 3 Encounters:  03/31/13 83.6 kg (184 lb 4.9 oz)  03/30/13 84 kg (185 lb 3 oz)  03/30/13 84 kg (185 lb 3 oz)    Physical Exam:  Nursing note and vitals reviewed.  Constitutional: She is oriented to person, place, and time. She appears well-developed and well-nourished.  HENT: oral mucosa pink and moist. Dentition borderline  Head: Normocephalic and atraumatic.  Eyes: Conjunctivae are normal. Pupils are equal, round, and reactive to light.  Neck: Normal range of motion.  Cardiovascular: Normal rate and regular rhythm. +gallop Pulmonary/Chest: Effort normal and breath sounds normal. No respiratory distress. She has no wheezes.  Abdominal: Soft. Bowel sounds are normal. She exhibits no distension.  Musculoskeletal: She exhibits no tenderness.  R-BKA stump well healed. Old callus. L-AKA with staples/intact/minimal drainage. Left stump is appropriately tender.  Leg is edematous.  Neurological: She is alert and oriented to person, place, and time.  Alert and oriented. No cognitive deficits. No gross sensory loss. UE's are 4+/5 prox to distal. RLE is 4/5 at hip/knee. Can lift left hip agst gravity.  Skin: Skin is warm and dry.  Psychiatric: She has a normal mood and affect. Her speech is normal and behavior is normal. Judgment and  thought content normal. Cognition and memory are normal.   Assessment/Plan: 1. 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 Physiatrist is providing close team supervision and 24 hour management of active medical problems listed below. Physiatrist and rehab team continue to assess barriers to discharge/monitor patient progress toward functional and medical goals. FIM:                   Comprehension Comprehension Mode: Auditory Comprehension: 5-Understands complex 90% of the time/Cues < 10% of the time  Expression Expression Mode: Verbal Expression: 6-Expresses complex ideas: With extra time/assistive device  Social Interaction Social Interaction: 6-Interacts appropriately with others with medication or extra time (anti-anxiety, antidepressant).  Problem Solving Problem Solving: 5-Solves basic 90% of the time/requires cueing < 10% of the time    Medical Problem List and Plan:  1. DVT Prophylaxis/Anticoagulation: Pharmaceutical: Lovenox  2. Pain Management: Continues to have significant pain L-AKA. Will monitor and adjust. Premedicated prior to therapies.  3. Mood: seems to be dealing with situation appropriately. Provide ego support. Will have LCSW follow for evaluation/support.  4. Neuropsych: This patient is capable of making decisions on her own behalf.  5. ESRD: HD on M, W, F. Check daily weights with 1200 cc fluid restrictions.  6. DM type 2: Hypoglycemic episodes noted. Will adjust lantus insulin and titrate as needed. Monitor BS with ac/hs cbg checks.  7.  CAD with ICM: Asymptomatic. On midodrine due to hypotension. Resume ASA. Did not use plavix at home.  8. Anemia of chronic disease: On aranesp weekly with routine monitoring of labs in dialysis.  9. OSA: Resume CPAP--uses face mask at home.  10. Pseudomonas wound infection: Gentamicin  thorough 07/29 for two week antibiotic therapy?  -continue abx for now given leukocytosis  -vac off---dry dressing to leg daily  LOS (Days) 1 A FACE TO FACE EVALUATION WAS PERFORMED  Jette Lewan T 03/31/2013 8:13 AM

## 2013-03-31 NOTE — Progress Notes (Signed)
Subjective: Interval History: none.  Objective: Vital signs in last 24 hours: Temp:  [98 F (36.7 C)-98.6 F (37 C)] 98.3 F (36.8 C) (07/19 0627) Pulse Rate:  [75-118] 118 (07/19 0627) Resp:  [16-20] 16 (07/19 0627) BP: (94-118)/(55-63) 98/55 mmHg (07/19 0627) SpO2:  [96 %-100 %] 98 % (07/19 0627) Weight:  [83.6 kg (184 lb 4.9 oz)] 83.6 kg (184 lb 4.9 oz) (07/19 0700) Weight change:   Intake/Output from previous day:   Intake/Output this shift:    General appearance: alert, cooperative and moderately obese Resp: clear to auscultation bilaterally Cardio: S1, S2 normal and systolic murmur: holosystolic 2/6, blowing at apex GI: obes pos bs Extremities: AVF LUA , B&T, NEw AKA on L,old bka on R, goin wound on L  Lab Results:  Recent Labs  03/29/13 0525 03/30/13 0822  WBC 14.6* 15.1*  HGB 10.9* 9.8*  HCT 33.3* 30.0*  PLT 194 191   BMET:  Recent Labs  03/29/13 0525 03/30/13 0752  NA 133* 130*  K 4.2 4.6  CL 95* 94*  CO2 27 24  GLUCOSE 131* 65*  BUN 11 16  CREATININE 3.42* 4.61*  CALCIUM 9.0 8.9   No results found for this basename: PTH,  in the last 72 hours Iron Studies: No results found for this basename: IRON, TIBC, TRANSFERRIN, FERRITIN,  in the last 72 hours  Studies/Results: No results found.  I have reviewed the patient's current medications.  Assessment/Plan: 1 ESRD  HD Mwf,  2 DM controlled 3 Anemia 4 CAD 5 PVD per VVS new AKA ok, concerrn of groin wound 6 G- sepsis groin source P AB, epo Rehab, HD on Mon    LOS: 1 day   Haakon Titsworth L 03/31/2013,10:13 AM

## 2013-03-31 NOTE — Progress Notes (Signed)
1729 Dr. Riley Kill contacted regarding patient's elevated HR.  Patient remains asymptomatic, MD aware and recommends Nursing staff continue to monitor.  No further recommendations at this time.

## 2013-03-31 NOTE — Evaluation (Signed)
Occupational Therapy Assessment and Plan  Patient Details  Name: [name] MRN: [id number] Date of Birth: 1955-10-08  OT Diagnosis: acute pain and muscle weakness (generalized) Rehab Potential: Rehab Potential: Excellent ELOS: 10-12 days   Today's Date: 03/31/2013 Time: 0930-1030 and 1430-1500 Time Calculation (min): 60 min and 30 min  Problem List:  Patient Active Problem List   Diagnosis Date Noted  . Unilateral AKA--left 03/30/2013  . Wound dehiscence, surgical--left groin 03/30/2013  . Leucocytosis 03/30/2013  . Hx of right BKA 03/30/2013  . Coronary atherosclerosis of native coronary artery 03/28/2013  . Sepsis 03/28/2013  . Anemia of chronic renal failure 03/28/2013  . Secondary hyperparathyroidism 03/28/2013  . Acute osteomyelitis of foot 03/25/2013  . Atherosclerotic PVD with ulceration 02/20/2013  . CVA (cerebral infarction) 02/12/2013  . Cardiomyopathy, ischemic 02/03/2013  . DM (diabetes mellitus), secondary, w/peripheral vascular complications 02/03/2013  . Pain in limb 01/31/2013  . PVD (peripheral vascular disease) 10/04/2012  . MGUS (monoclonal gammopathy of unknown significance) 07/11/2012  . Infected sebaceous cyst of Right posterior neck. 09/30/2011  . HYPERLIPIDEMIA 07/28/2010  . OBSTRUCTIVE SLEEP APNEA 07/27/2010  . ABNORMAL HEART RHYTHMS 07/27/2010  . End stage renal disease 07/27/2010    Past Medical History:  Past Medical History  Diagnosis Date  . Diabetes mellitus, type 2   . Secondary hyperparathyroidism   . OSA (obstructive sleep apnea)     NPSG 11.23.11-AHI 64.9/hr-central and obstructive apnea  . Ischemic cardiomyopathy     EF 25-30% 2011  . CAD (coronary artery disease)   . Pulmonary hypertension     Est PAsyst 60-Dr Mendel Ryder, off coumadin, CT neg for PE  . Hyperlipidemia   . Anemia   . Thyroid disease   . Pulmonary edema   . Peripheral vascular disease   . Sebaceous cyst   . 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 Velvet Bathe   . Irregular heart beat   . Shortness of breath     with exertion  . Hemodialysis patient     M,W,F  . TIA (transient ischemic attack) June 2014  . Ulcer of heel due to diabetes     left  . Kidney disease Dialysis MWF    ESRD secondary to DM, failed transplant 2006. Dialysis   Past Surgical History:  Past Surgical History  Procedure Laterality Date  . Coronary artery bypass graft  2011    3V  . Insertion of dialysis catheter    . 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 Nasal mucosal biopsy    . Ovarian cyst surgery    . 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 Laser retinal surgery  2011  . Kidney transplant      rejection  . Abdominal angiogram Left 01/25/13  . Colonoscopy    . Cardiac catheterization    . Eye surgery    . Femoral-popliteal bypass graft Left 02/20/2013    Procedure:  FEMORAL-to below knee POPLITEAL ARTERY bypasswith saphenous vein with intraoperative angiogram ;  Surgeon: Chuck Hint, MD;  Location[facility] OR;  Service: Vascular;  Laterality: Left;  fem-below knee pop  . Amputation Left 03/27/2013    Procedure: AMPUTATION ABOVE KNEE;  Surgeon: Chuck Hint, MD;  Location[facility] OR;  Service: Vascular;  Laterality: Left;    Assessment & Plan Clinical Impression:  [name] is a 57 y.o. female with history of SLE, DM type 2, ESRD--failed transplant, PVD-s/p R BKA  with chronic ulcers left heel and and left thigh wound s/p L-FPBG. She's well known to rehab from prior stays past R-BKA and CABG and was independent with use of R-BK prosthesis PTA. She was admitted on 03/15/13 past Fall and reports of 1-2 week history of weakness. Left heel ulcer with necrotic material and foul odor and xrays done revealed osteomyelitis with bone avulsion. She was started on IV antibiotics and vascular felt that foot was not salvageable. On 07/15, patient underwent L-AKA with placement of VAC by Dr. Edilia Bo. Wound  culture positive for Pseudomonas and antibiotics changed to gentamicin on 07/16 with recommendations for 2 weeks treatment. Blood cultures pending. Therapies initiated and patient deconditioned with impairments due to AKA. Therapy team recommending CIR. Patient transferred to CIR on 03/30/2013 .    Patient currently requires mod with basic self-care skills secondary to muscle weakness, decreased cardiorespiratoy endurance and decreased sitting balance.  Prior to hospitalization, patient was independent with all ADLs, living alone and driving with use of her right prosthetic.  Patient will benefit from skilled intervention to increase independence with basic self-care skills prior to discharge home independently.  Anticipate patient will require intermittent supervision and follow up home health.  OT - End of Session Activity Tolerance: Tolerates 30+ min activity with multiple rests Endurance Deficit: Yes Endurance Deficit Description: pt needs frequent rest breaks OT Assessment Rehab Potential: Excellent Barriers to Discharge: Decreased caregiver support OT Plan OT Intensity: Minimum of 1-2 x/day, 45 to 90 minutes OT Frequency: 5 out of 7 days OT Duration/Estimated Length of Stay: 10-12 days OT Treatment/Interventions: Balance/vestibular training;Discharge planning;DME/adaptive equipment instruction;Functional mobility training;Pain management;Patient/family education;Self Care/advanced ADL retraining;Skin care/wound managment;Therapeutic Activities;Therapeutic Exercise;UE/LE Strength taining/ROM OT Recommendation Patient destination: Home Follow Up Recommendations: Home health OT   Skilled Therapeutic Intervention Visit 1: 2/10 pain in left residual limb.  Pt seen for inititial evaluation and ADL retraining of B/D from bed level.  Pt worked on bed mobility of rolling L/R, supine >< sit with min assist, and lateral leans with supervision to wash bottom and pull pants over hips with assist. Pt  participated very well but did need frequent rest breaks.  Pt resting in bed at end of session with call light and phone in reach.  Visit 2: No c/o pain.  Pt in bed at start of session.  Assisted nursing with limb wrapping.  Encouraged pt to work on toilet transfers, but she stated she was too exhausted from earlier therapy.  Demonstrated to pt how she will need to do a scoot transfer onto toilet with assist.  Pt then engaged in UE AROM exercises to increase strength and activity tolerance. Pt resting in bed at end of session with call light in reach.  OT Evaluation Precautions/Restrictions  Precautions Precautions: Fall Precaution Comments: new L AKA, old R BKA, wound vac on LLE Restrictions Weight Bearing Restrictions: Yes LLE Weight Bearing: Non weight bearing  Pain Pain Assessment Pain Score: 2  Faces Pain Scale: No hurt Pain Type: Surgical pain Pain Location: Leg Pain Orientation: Left Pain Descriptors / Indicators: Aching Home Living/Prior Functioning Home Living Available Help at Discharge: Family;Available PRN/intermittently Type of Home: Apartment Home Access: Ramped entrance Home Layout: One level Additional Comments: home is handicap accessible  Lives With: Alone Prior Function Level of Independence: Independent with basic ADLs;Independent with homemaking with wheelchair;Requires assistive device for independence Driving: Yes Vocation: Retired Comments: Pt states she used 4 wheeled walker to ambulate short distances and manual wheelchair for longer distances ADL  Refer to FIM - overall mod assist with LB adls, s/u with UB adls Vision/Perception  Vision - History Baseline Vision: Wears glasses only for reading Patient Visual Report: No change from baseline Vision - Assessment Eye Alignment: Within Functional Limits Perception Perception: Within Functional Limits Praxis Praxis: Intact  Cognition Overall Cognitive Status: Within Functional Limits for tasks  assessed Sensation Sensation Light Touch: Appears Intact Stereognosis: Appears Intact Hot/Cold: Appears Intact Coordination Gross Motor Movements are Fluid and Coordinated: Yes Fine Motor Movements are Fluid and Coordinated: Yes Motor  Motor Motor - Skilled Clinical Observations: Generalized muscle weakness Mobility    refer to FIM Trunk/Postural Assessment  Cervical Assessment Cervical Assessment: Within Functional Limits Thoracic Assessment Thoracic Assessment: Within Functional Limits Lumbar Assessment Lumbar Assessment: Within Functional Limits Postural Control Postural Control: Within Functional Limits  Balance Static Sitting Balance Static Sitting - Level of Assistance: 5: Stand by assistance Dynamic Sitting Balance Dynamic Sitting - Level of Assistance: 5: Stand by assistance Extremity/Trunk Assessment RUE Assessment RUE Assessment: Within Functional Limits (4/5 strength) LUE Assessment LUE Assessment: Within Functional Limits (4/5 strength)  FIM:  FIM - Grooming Grooming Steps: Wash, rinse, dry face;Wash, rinse, dry hands (dentures are missing) Grooming: 5: Set-up assist to obtain items FIM - Bathing Bathing Steps Patient Completed: Chest;Right Arm;Left Arm;Abdomen;Front perineal area;Buttocks;Right upper leg;Left upper leg (from bed level) Bathing: 5: Set-up assist to: Obtain items FIM - Upper Body Dressing/Undressing Upper body dressing/undressing steps patient completed: Thread/unthread right bra strap;Thread/unthread left bra strap;Thread/unthread right sleeve of pullover shirt/dresss;Thread/unthread left sleeve of pullover shirt/dress;Put head through opening of pull over shirt/dress;Pull shirt over trunk Upper body dressing/undressing: 6: More than reasonable amount of time (Pt able to retrieve clothing from drawers next to bed.) FIM - Lower Body Dressing/Undressing Lower body dressing/undressing steps patient completed: Thread/unthread right underwear  leg;Thread/unthread left underwear leg;Thread/unthread right pants leg;Thread/unthread left pants leg (bed level) Lower body dressing/undressing: 3: Mod-Patient completed 50-74% of tasks FIM - Toileting Toileting: 0: Activity did not occur FIM - Archivist Transfers: 0-Activity did not occur FIM - IT consultant Transfers: 0-Activity did not occur or was simulated   Refer to Care Plan for Long Term Goals  Recommendations for other services: None  Discharge Criteria: Patient will be discharged from OT if patient refuses treatment 3 consecutive times without medical reason, if treatment goals not met, if there is a change in medical status, if patient makes no progress towards goals or if patient is discharged from hospital.  The above assessment, treatment plan, treatment alternatives and goals were discussed and mutually agreed upon: by patient  [facility] 03/31/2013, 12:51 PM

## 2013-03-31 NOTE — Progress Notes (Signed)
VASCULAR PROGRESS NOTE  SUBJECTIVE: No specific complaint  PHYSICAL EXAM: Filed Vitals:   03/30/13 1540 03/31/13 0627 03/31/13 0700  BP: 113/59 98/55   Pulse: 99 118   Temp: 98.6 F (37 C) 98.3 F (36.8 C)   TempSrc: Oral Oral   Resp: 20 16   Weight:   184 lb 4.9 oz (83.6 kg)  SpO2: 100% 98%    VAC removed from left below the knee amputation. Amputation site looks fine. Groin wound and left thigh wound granulating.  ASSESSMENT AND PLAN:  * POD 4 s/p Left AKA  * Begin dressing changes to amputation site. Continue W-D dressing changes to left groin and left thigh wound.   * Appreciate Rehab's help.  Cari Caraway Beeper: [phone] 03/31/2013

## 2013-03-31 NOTE — Progress Notes (Signed)
ANTIBIOTIC CONSULT NOTE - FOLLOW UP  Pharmacy Consult for Gentamicin Indication: extensive osteomyelitis, now s/p L-AKA  Allergies  Allergen Reactions  . Neurontin (Gabapentin) Anaphylaxis  . Ezetimibe-Simvastatin Other (See Comments)    REACTION: face edema; itching  . Klonopin (Clonazepam) Other (See Comments)    hallucination  . Sertraline Hcl Other (See Comments)    hallucination    Patient Measurements: Weight: 84 kg  Labs:  Recent Labs  03/29/13 0525 03/30/13 0752 03/30/13 0822  WBC 14.6*  --  15.1*  HGB 10.9*  --  9.8*  PLT 194  --  191  CREATININE 3.42* 4.61*  --    The CrCl is unknown because both a height and weight (above a minimum accepted value) are required for this calculation.  Recent Labs  03/30/13 0751  GENTRANDOM 3.2     Microbiology: Recent Results (from the past 720 hour(s))  CULTURE, BLOOD (ROUTINE X 2)     Status: None   Collection Time    03/25/13  9:17 AM      Result Value Range Status   Specimen Description BLOOD LEFT ARM   Final   Special Requests BOTTLES DRAWN AEROBIC ONLY 10CC   Final   Culture  Setup Time 03/25/2013 20:46   Final   Culture     Final   Value:        BLOOD CULTURE RECEIVED NO GROWTH TO DATE CULTURE WILL BE HELD FOR 5 DAYS BEFORE ISSUING A FINAL NEGATIVE REPORT   Report Status PENDING   Incomplete  CULTURE, BLOOD (ROUTINE X 2)     Status: None   Collection Time    03/25/13  9:22 AM      Result Value Range Status   Specimen Description BLOOD LEFT HAND   Final   Special Requests BOTTLES DRAWN AEROBIC ONLY 3CC   Final   Culture  Setup Time 03/25/2013 20:46   Final   Culture     Final   Value:        BLOOD CULTURE RECEIVED NO GROWTH TO DATE CULTURE WILL BE HELD FOR 5 DAYS BEFORE ISSUING A FINAL NEGATIVE REPORT   Report Status PENDING   Incomplete  WOUND CULTURE     Status: None   Collection Time    03/26/13  1:39 PM      Result Value Range Status   Specimen Description WOUND LEFT FOOT   Final   Special Requests  LEFT HEEL   Final   Gram Stain     Final   Value: NO WBC SEEN     NO SQUAMOUS EPITHELIAL CELLS SEEN     NO ORGANISMS SEEN   Culture ABUNDANT PSEUDOMONAS AERUGINOSA   Final   Report Status 03/30/2013 FINAL   Final   Organism ID, Bacteria PSEUDOMONAS AERUGINOSA   Final  SURGICAL PCR SCREEN     Status: None   Collection Time    03/27/13  1:25 AM      Result Value Range Status   MRSA, PCR NEGATIVE  NEGATIVE Final   Staphylococcus aureus NEGATIVE  NEGATIVE Final   Comment:            The Xpert SA Assay (FDA     approved for NASAL specimens     in patients over 52 years of age),     is one component of     a comprehensive surveillance     program.  Test performance has     been validated by [employer]  [facility] for patients greater     than or equal to 16 year old.     It is not intended     to diagnose infection nor to     guide or monitor treatment.    Anti-infectives   Start     Dose/Rate Route Frequency Ordered Stop   04/02/13 1200  gentamicin (GARAMYCIN) 140 mg in dextrose 5 % 50 mL IVPB     140 mg 107 mL/hr over 30 Minutes Intravenous Every M-W-F (Hemodialysis) 03/30/13 1643 04/10/13 2359   03/30/13 1700  gentamicin (GARAMYCIN) 140 mg in dextrose 5 % 50 mL IVPB     140 mg 107 mL/hr over 30 Minutes Intravenous  Once 03/30/13 1631 03/30/13 2143      Assessment: 57 y.o. F who continues on Gentamicin for morganella and PSA from 7/9 BCx (per MD note). Pt also with PSA wound infection/osteo this admission -- s/p L-AKA on 7/15. The patient is noted to be ESRD and receives HD on MWF. The patient was loaded with Gentamicin 200 mg on 7/16 and received Gentamicin 140 mg post HD on 7/18. Will increase post-HD Gentamicin dose slightly today given the patient's usual duration of 4 hours of HD.   Goal of Therapy:  Pre-HD gentamicin level <4 mcg/ml with normal HD session  Plan:  1. Increase Gentamicin to 160 mg IV post HD sessions on MWF 2. Will continue to follow HD schedule/duration,  culture results, LOT, and antibiotic de-escalation plans   Georgina Pillion, PharmD, BCPS Clinical Pharmacist Pager: [phone] 03/31/2013 9:38 AM

## 2013-04-01 ENCOUNTER — Inpatient Hospital Stay (HOSPITAL_COMMUNITY): Payer: Medicare Other | Admitting: *Deleted

## 2013-04-01 LAB — GLUCOSE, CAPILLARY
Glucose-Capillary: 67 mg/dL — ABNORMAL LOW (ref 70–99)
Glucose-Capillary: 80 mg/dL (ref 70–99)
Glucose-Capillary: 72 mg/dL (ref 70–99)
Glucose-Capillary: 107 mg/dL — ABNORMAL HIGH (ref 70–99)

## 2013-04-01 NOTE — Progress Notes (Signed)
Physical Therapy Session Note  Patient Details  Name: [name] MRN: [id number] Date of Birth: 07-28-1956  Today's Date: 04/01/2013 Time: 1410-1505 Time Calculation (min): 55 min  Skilled Therapeutic Interventions/Progress Updates:  Patient in bed, has a guest in her room and asked for few minutes delay in therapy. After therapist returned she was willing to work and actively participated in therapy.Training in rolling R and L with hand rails and with maintaining safety precautions. Patient needs supervision  in bed mobility due to disturbed balance and difficulty in adjusting to new deficits. Supine to sit with Supervision,Patient is able to don/doff her R prosthesis Independent. Attempted sit to stan , not able to push up or pull to stand , 5 x 1/2 standing with max A and poor balance. Transfer bed<=>w/c with sliding board, Min A needed for placing the board appropriately, patient is able to initiate the slide maintain balance during , and terminate task, complains of being tired. In supine hip flexion with manual resistance 2 x 10 , hip adb 2x10, side laying hip abd 2x10 hip extension 2 x 10 , laying prone hip extension 2 x 10 once with manual resistance.   Therapy Documentation Precautions:  Precautions Precautions: Fall Precaution Comments: new L AKA, old R BKA,  Restrictions Weight Bearing Restrictions: Yes LLE Weight Bearing: Non weight bearing (4x4 guaze and ace wrap) Pain: Pain Assessment Pain Assessment: 0-10 Pain Score: 8  Faces Pain Scale: No hurt Pain Type: Surgical pain Pain Location: Leg Pain Orientation: Left Pain Descriptors / Indicators: Aching Pain Frequency: Intermittent Pain Onset: Gradual Pain Intervention(s): Medication (See eMAR)  See FIM for current functional status  Therapy/Group: Individual Therapy  Dorna Mai 04/01/2013, 3:14 PM

## 2013-04-01 NOTE — Progress Notes (Signed)
Subjective: Interval History: has no complaint.  Objective: Vital signs in last 24 hours: Temp:  [98.1 F (36.7 C)-98.4 F (36.9 C)] 98.4 F (36.9 C) (07/20 0500) Pulse Rate:  [110-122] 110 (07/20 0500) Resp:  [18] 18 (07/20 0500) BP: (103-112)/(62-74) 103/62 mmHg (07/20 0500) SpO2:  [95 %-96 %] 95 % (07/20 0500) Weight:  [83.9 kg (184 lb 15.5 oz)] 83.9 kg (184 lb 15.5 oz) (07/20 0700) Weight change: 0.3 kg (10.6 oz)  Intake/Output from previous day: 07/19 0701 - 07/20 0700 In: 220 [P.O.:220] Out: -  Intake/Output this shift:    General appearance: alert, cooperative and moderately obese Resp: clear to auscultation bilaterally Cardio: S1, S2 normal and systolic murmur: holosystolic 2/6, blowing at apex GI: pos bs, liver down 5 cm,obese Extremities: LUA AVF B&T, R BKA, new LAKA with dressing, L groin wound  Lab Results:  Recent Labs  03/30/13 0822  WBC 15.1*  HGB 9.8*  HCT 30.0*  PLT 191   BMET:  Recent Labs  03/30/13 0752  NA 130*  K 4.6  CL 94*  CO2 24  GLUCOSE 65*  BUN 16  CREATININE 4.61*  CALCIUM 8.9   No results found for this basename: PTH,  in the last 72 hours Iron Studies: No results found for this basename: IRON, TIBC, TRANSFERRIN, FERRITIN,  in the last 72 hours  Studies/Results: No results found.  I have reviewed the patient's current medications.  Assessment/Plan: 1 ESRD for HD MWF 2 DM controlled 3 CAD stable 4 PVD AKA per VVS 5 Anemia stable 6 Sepsis cont AB for 2 wk total 7 Obestiy P HD, epo, AB, DM control     LOS: 2 days   Yazid Pop L 04/01/2013,8:55 AM

## 2013-04-01 NOTE — Progress Notes (Signed)
Subjective/Complaints: Left leg pain present but manageable. Tachycardic over last 24+ hours---denies symptoms. Slept well A 12 point review of systems has been performed and if not noted above is otherwise negative.   Objective: Vital Signs: Blood pressure 103/62, pulse 110, temperature 98.4 F (36.9 C), temperature source Oral, resp. rate 18, weight 83.9 kg (184 lb 15.5 oz), SpO2 95.00%. No results found.  Recent Labs  03/30/13 0822  WBC 15.1*  HGB 9.8*  HCT 30.0*  PLT 191    Recent Labs  03/30/13 0752  NA 130*  K 4.6  CL 94*  GLUCOSE 65*  BUN 16  CREATININE 4.61*  CALCIUM 8.9   CBG (last 3)   Recent Labs  03/31/13 1648 03/31/13 2010 04/01/13 0743  GLUCAP 71 99 67*    Wt Readings from Last 3 Encounters:  04/01/13 83.9 kg (184 lb 15.5 oz)  03/30/13 84 kg (185 lb 3 oz)  03/30/13 84 kg (185 lb 3 oz)    Physical Exam:  Nursing note and vitals reviewed.  Constitutional: She is oriented to person, place, and time. She appears well-developed and well-nourished.  HENT: oral mucosa pink and moist. Dentition borderline  Head: Normocephalic and atraumatic.  Eyes: Conjunctivae are normal. Pupils are equal, round, and reactive to light.  Neck: Normal range of motion.  Cardiovascular: HR 100-110, regular rhythm. +gallop Pulmonary/Chest: Effort normal and breath sounds normal. No respiratory distress. She has no wheezes.  Abdominal: Soft. Bowel sounds are normal. She exhibits no distension.  Musculoskeletal: She exhibits no tenderness.  R-BKA stump well healed. Old callus. L-AKA with staples/intact/minimal drainage. Left stump is appropriately tender.  Leg is edematous.  Neurological: She is alert and oriented to person, place, and time.  Alert and oriented. No cognitive deficits. No gross sensory loss. UE's are 4+/5 prox to distal. RLE is 4/5 at hip/knee. Can lift left hip agst gravity.  Skin: Skin is warm and dry.  Psychiatric: She has a normal mood and affect. Her  speech is normal and behavior is normal. Judgment and thought content normal. Cognition and memory are normal.   Assessment/Plan: 1. 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 Physiatrist is providing close team supervision and 24 hour management of active medical problems listed below. Physiatrist and rehab team continue to assess barriers to discharge/monitor patient progress toward functional and medical goals. FIM: FIM - Bathing Bathing Steps Patient Completed: Chest;Right Arm;Left Arm;Abdomen;Front perineal area;Buttocks;Right upper leg;Left upper leg (from bed level) Bathing: 5: Set-up assist to: Obtain items  FIM - Upper Body Dressing/Undressing Upper body dressing/undressing steps patient completed: Thread/unthread right bra strap;Thread/unthread left bra strap;Thread/unthread right sleeve of pullover shirt/dresss;Thread/unthread left sleeve of pullover shirt/dress;Put head through opening of pull over shirt/dress;Pull shirt over trunk Upper body dressing/undressing: 6: More than reasonable amount of time (Pt able to retrieve clothing from drawers next to bed.) FIM - Lower Body Dressing/Undressing Lower body dressing/undressing steps patient completed: Thread/unthread right underwear leg;Thread/unthread left underwear leg;Thread/unthread right pants leg;Thread/unthread left pants leg (bed level) Lower body dressing/undressing: 3: Mod-Patient completed 50-74% of tasks  FIM - Toileting Toileting: 0: Activity did not occur  FIM - Archivist Transfers: 0-Activity did not occur     FIM - Locomotion: Wheelchair Distance: 150  Comprehension Comprehension Mode: Auditory Comprehension: 6-Follows complex conversation/direction: With extra time/assistive device  Expression Expression Mode: Verbal Expression: 6-Expresses complex ideas: With  extra time/assistive device  Social Interaction Social Interaction: 6-Interacts appropriately with others with medication or extra time (anti-anxiety, antidepressant).  Problem Solving Problem Solving: 6-Solves complex problems: With extra time  Memory Memory: 6-More than reasonable amt of time Medical Problem List and Plan:  1. DVT Prophylaxis/Anticoagulation: Pharmaceutical: Lovenox  2. Pain Management: Continues to have significant pain L-AKA. Will monitor and adjust. Premedicated prior to therapies.  3. Mood: seems to be dealing with situation appropriately. Provide ego support. Will have LCSW follow for evaluation/support.  4. Neuropsych: This patient is capable of making decisions on her own behalf.  5. ESRD: HD on M, W, F. Check daily weights with 1200 cc fluid restrictions.  6. DM type 2: Hypoglycemic episodes noted. Will adjust lantus insulin and titrate as needed. Monitor BS with ac/hs cbg checks.  7. CAD with ICM: Asymptomatic. On midodrine due to hypotension. Resumed ASA. Did not use plavix at home.   -recent tachycardia---denies symptoms   -on no medication for rate control PTA   -consider low dose beta blocker   -may be a pain component   -check ekg 8. Anemia of chronic disease: On aranesp weekly with routine monitoring of labs in dialysis.  9. OSA: Resume CPAP--uses face mask at home.  10. Pseudomonas wound infection: Gentamicin thorough 07/29 for two week antibiotic therapy?  -continue abx for now given leukocytosis  -vac off---dry dressing to leg daily  LOS (Days) 2 A FACE TO FACE EVALUATION WAS PERFORMED  SWARTZ,ZACHARY T 04/01/2013 8:54 AM

## 2013-04-02 ENCOUNTER — Inpatient Hospital Stay (HOSPITAL_COMMUNITY): Payer: Medicare Other | Admitting: Physical Therapy

## 2013-04-02 ENCOUNTER — Inpatient Hospital Stay (HOSPITAL_COMMUNITY): Payer: Medicare Other

## 2013-04-02 ENCOUNTER — Inpatient Hospital Stay (HOSPITAL_COMMUNITY): Payer: Medicare Other | Admitting: Occupational Therapy

## 2013-04-02 LAB — GLUCOSE, CAPILLARY
Glucose-Capillary: 155 mg/dL — ABNORMAL HIGH (ref 70–99)
Glucose-Capillary: 69 mg/dL — ABNORMAL LOW (ref 70–99)
Glucose-Capillary: 87 mg/dL (ref 70–99)
Glucose-Capillary: 90 mg/dL (ref 70–99)

## 2013-04-02 MED ORDER — INSULIN ASPART 100 UNIT/ML ~~LOC~~ SOLN
0.0000 [IU] | Freq: Three times a day (TID) | SUBCUTANEOUS | Status: DC
  Administered 2013-04-02: 2 [IU] via SUBCUTANEOUS

## 2013-04-02 NOTE — Progress Notes (Signed)
Pt signed off 19 min early on hemodialysis Tx AMA. States that her leg is hurting and she is tired from rehab. Dr. Arrie Aran called

## 2013-04-02 NOTE — Progress Notes (Signed)
Subjective: Interval History: has complaints some stump swelling but better.  Objective: Vital signs in last 24 hours: Temp:  [97.9 F (36.6 C)-98.1 F (36.7 C)] 97.9 F (36.6 C) (07/21 0530) Pulse Rate:  [97] 97 (07/21 0530) Resp:  [18-20] 18 (07/21 0530) BP: (109-119)/(60-73) 119/73 mmHg (07/21 0530) SpO2:  [97 %-99 %] 97 % (07/21 0530) Weight:  [83.6 kg (184 lb 4.9 oz)] 83.6 kg (184 lb 4.9 oz) (07/21 0530) Weight change: -0.3 kg (-10.6 oz)  Intake/Output from previous day: 07/20 0701 - 07/21 0700 In: 180 [P.O.:180] Out: -  Intake/Output this shift:    General appearance: alert, cooperative and moderately obese Resp: clear to auscultation bilaterally Cardio: S1, S2 normal and systolic murmur: holosystolic 2/6, blowing at apex GI: obese,pos bs Extremities: AVF LUA B&T, New aka L no drainage, old R BKA, Wound L groin clean  Lab Results: No results found for this basename: WBC, HGB, HCT, PLT,  in the last 72 hours BMET: No results found for this basename: NA, K, CL, CO2, GLUCOSE, BUN, CREATININE, CALCIUM,  in the last 72 hours No results found for this basename: PTH,  in the last 72 hours Iron Studies: No results found for this basename: IRON, TIBC, TRANSFERRIN, FERRITIN,  in the last 72 hours  Studies/Results: No results found.  I have reviewed the patient's current medications.  Assessment/Plan: 1 CRF HD today 2 Anemia epo 3 HPTH vit D 4 DM controlled 5 PVd per [facility] 6 CAD 7 Debill per rehab P HD, epo, dm controll. Gent for 1 more week.    LOS: 3 days   Heidie Krall L 04/02/2013,8:54 AM

## 2013-04-02 NOTE — Progress Notes (Signed)
Orthopedic Tech Progress Note Patient Details:  [name] 1956/05/09 [id number] Brace completed by [facility] Patient ID: [name], female   DOB: 06-29-1956, 57 y.o.   MRN: [id number]   [name] 04/02/2013, 3:34 PM

## 2013-04-02 NOTE — Plan of Care (Signed)
Problem: RH PAIN MANAGEMENT Goal: RH STG PAIN MANAGED AT OR BELOW PT'S PAIN GOAL Pt will rate pain/phantom limb pain less than 3 on scale of 0-10  Outcome: Not Progressing Rates pain as 6, at this time.

## 2013-04-02 NOTE — Plan of Care (Signed)
Problem: RH SKIN INTEGRITY Goal: RH STG ABLE TO PERFORM INCISION/WOUND CARE W/ASSISTANCE STG Able To Perform Incision/Wound Care With Max Assistance.  Outcome: Progressing Requiring daily santyl to dehisced wound x 2.

## 2013-04-02 NOTE — Progress Notes (Signed)
Physical Therapy Session Note  Patient Details  Name: [name] MRN: [id number] Date of Birth: 27-Aug-1956  Today's Date: 04/02/2013 Time: 4696-2952 Time Calculation (min): 30 min  Short Term Goals: Week 1:  PT Short Term Goal 1 (Week 1): STG=LTG, overall Mod I for transfers and WC mobility  Skilled Therapeutic Interventions/Progress Updates:   Pt asleep in bed upon entry, easy to wake and agreeable to PT. Dressing performed at bed level, pt req increased time for completion as well as min-mod v/c for lower body dressing. Pt req min A for lower body dressing in supine and (S) for upper body dressing in sitting.  Also sitting EOB, pt able to don R prosthesis w/ (S). Pt able to perform SBT bed<>w/c w/ mod A for slide board placement/removal and min-guard during transfer. Pt educated regarding importance of pressure reliefs when sitting in w/c for long periods of time, pt verbalized understanding of this. Pt w/ report of feeling light headed after being in w/c x3', pt agreeable to return to bed until next tx session for increased safety. RN aware of pain and reported to be in shortly, all needs w/in pt's reach and bed alarm on.   Therapy Documentation Precautions:  Precautions Precautions: Fall Precaution Comments: new L AKA, old R BKA,  Restrictions Weight Bearing Restrictions: Yes LLE Weight Bearing: Non weight bearing  Pain: Pain Assessment Pain Score:  (Pt verbalized pain, but did not give specific number) Pain Intervention(s): RN made aware  See FIM for current functional status  Therapy/Group: Individual Therapy  Denzil Hughes 04/02/2013, 12:02 PM

## 2013-04-02 NOTE — Progress Notes (Signed)
Physical Therapy Session Note  Patient Details  Name: [name] MRN: [id number] Date of Birth: 1956/07/05  Today's Date: 04/02/2013 Session #1     Session #2 Time: 7829-5621    Time: 3086-5784 Time Calculation (min): 54 min  Time Calculation (min): 41  Short Term Goals: Week 1:  PT Short Term Goal 1 (Week 1): STG=LTG, overall Mod I for transfers and WC mobility  Skilled Therapeutic Interventions/Progress Updates:    Session #1:  Therapy session focused on functional transfers and therapeutic exercise. Pt performed squat pivot w/c>bed transfer with Min A and VC's for set up. Pt was able to donn R LE prosthesis with S in sitting. Pt  Performed sit<>stand transfer (x2) using RW and mod A from elevated bed surface using R LE prosthesis and VC's for R LE placement. Pt was able to tolerate standing for 1-2 min time periods before needing to take a seated rest break. Pt performed supine therapeutc exercises that consisted of glut squeezes, R hip extension and flexion (1 set x 10 reps) to increase LE strengthening to improve functional mobility and transfers. Pt c/o dizziness and pain at start of therapy session however pt was able to tolerate therapy session as requested within pt room. Nursing staff was aware of pt c/o prior to therapy session. Overall, vitals signs remained stable (BP: 114/68 mmHg and HR 100 BPM; reaching as high as 120 BPM) and pt requires frequent rest breaks due to fatigue and decreased activity tolerance.    Session #2: Therapy session focused on w/c propulsion, dynamic standing balance, and functional transfers. Pt performed w/c propulsion for 150 ft x 2 with S for UE strengthening and cardiovascular endurance. Pt performed w/c<>mat table squat pivot transfer with min A and VC's to WB through the R prosthetic limb when performing transfer and not to simply scoot. Pt completed sit <>stand transfer (x2) with RW and max-mod A and VC's for UE placement on RW. Pt completed standing  dynamic balance challenge with min A while maintaining postural control with support of one UE using RW. Pt was educated on and performed A/P transfer upon returning to bed at end of therapy session with min A for balance and VC's weight shifting hips. Pt required frequent seated rest breaks during therapy session and vital signs remained stable throughout session. Minimal c/o pain during therapy session however pt was able to tolerate session with the appropriate rest breaks.  Therapy Documentation Precautions:  Precautions Precautions: Fall Precaution Comments: new L AKA, old R BKA,  Restrictions Weight Bearing Restrictions: Yes LLE Weight Bearing: Non weight bearing  See FIM for current functional status  Therapy/Group: Individual Therapy  [name], [name] 04/02/2013, 1:15 PM

## 2013-04-02 NOTE — Progress Notes (Signed)
Subjective/Complaints: No new issues. Slept well. Pain under better control as a whole.  A 12 point review of systems has been performed and if not noted above is otherwise negative.   Objective: Vital Signs: Blood pressure 119/73, pulse 97, temperature 97.9 F (36.6 C), temperature source Oral, resp. rate 18, weight 83.6 kg (184 lb 4.9 oz), SpO2 97.00%. No results found. No results found for this basename: WBC, HGB, HCT, PLT,  in the last 72 hours No results found for this basename: NA, K, CL, CO, GLUCOSE, BUN, CREATININE, CALCIUM,  in the last 72 hours CBG (last 3)   Recent Labs  04/01/13 2026 04/01/13 2240 04/02/13 0726  GLUCAP 74 107* 87    Wt Readings from Last 3 Encounters:  04/02/13 83.6 kg (184 lb 4.9 oz)  03/30/13 84 kg (185 lb 3 oz)  03/30/13 84 kg (185 lb 3 oz)    Physical Exam:  Nursing note and vitals reviewed.  Constitutional: She is oriented to person, place, and time. She appears well-developed and well-nourished.  HENT: oral mucosa pink and moist. Dentition borderline  Head: Normocephalic and atraumatic.  Eyes: Conjunctivae are normal. Pupils are equal, round, and reactive to light.  Neck: Normal range of motion.  Cardiovascular: HR 100-110, regular rhythm. +gallop Pulmonary/Chest: Effort normal and breath sounds normal. No respiratory distress. She has no wheezes.  Abdominal: Soft. Bowel sounds are normal. She exhibits no distension.  Musculoskeletal: She exhibits no tenderness.  R-BKA stump well healed.  L-AKA with staples/intact/minimal drainage. Left stump is appropriately tender.  Leg is edematous.  Neurological: She is alert and oriented to person, place, and time.  Alert and oriented. No cognitive deficits. No gross sensory loss. UE's are 4+/5 prox to distal. RLE is 4/5 at hip/knee. Can lift left hip agst gravity.  Skin: Skin is warm and dry.  Psychiatric: She has a normal mood and affect. Her speech is normal and behavior is normal. Judgment and  thought content normal. Cognition and memory are normal.   Assessment/Plan: 1. 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 Physiatrist is providing close team supervision and 24 hour management of active medical problems listed below. Physiatrist and rehab team continue to assess barriers to discharge/monitor patient progress toward functional and medical goals.  Stump sock per [facility].   FIM: FIM - Bathing Bathing Steps Patient Completed: Chest;Right Arm;Left Arm;Abdomen;Front perineal area;Buttocks;Right upper leg;Left upper leg (from bed level) Bathing: 5: Set-up assist to: Obtain items  FIM - Upper Body Dressing/Undressing Upper body dressing/undressing steps patient completed: Thread/unthread right bra strap;Thread/unthread left bra strap;Thread/unthread right sleeve of pullover shirt/dresss;Thread/unthread left sleeve of pullover shirt/dress;Put head through opening of pull over shirt/dress;Pull shirt over trunk Upper body dressing/undressing: 6: More than reasonable amount of time (Pt able to retrieve clothing from drawers next to bed.) FIM - Lower Body Dressing/Undressing Lower body dressing/undressing steps patient completed: Thread/unthread right underwear leg;Thread/unthread left underwear leg;Thread/unthread right pants leg;Thread/unthread left pants leg (bed level) Lower body dressing/undressing: 3: Mod-Patient completed 50-74% of tasks  FIM - Toileting Toileting: 0: Activity did not occur  FIM - Archivist Transfers: 0-Activity did not occur  FIM - Banker Devices: Sliding board Bed/Chair Transfer: 5: Sit > Supine: Supervision (verbal cues/safety issues);4: Chair or W/C > Bed: Min A (steadying Pt. > 75%)  FIM - Locomotion: Wheelchair Distance: 150 Locomotion: Wheelchair: 0: Activity did  not occur FIM - Locomotion: Ambulation Locomotion: Ambulation: 0: Activity did not occur  Comprehension Comprehension Mode: Auditory Comprehension: 6-Follows complex conversation/direction: With extra time/assistive device  Expression Expression Mode: Verbal Expression: 6-Expresses complex ideas: With extra time/assistive device  Social Interaction Social Interaction: 6-Interacts appropriately with others with medication or extra time (anti-anxiety, antidepressant).  Problem Solving Problem Solving: 6-Solves complex problems: With extra time  Memory Memory: 6-More than reasonable amt of time Medical Problem List and Plan:  1. DVT Prophylaxis/Anticoagulation: Pharmaceutical: Lovenox  2. Pain Management: Continues to have significant pain L-AKA. Will monitor and adjust. Premedicated prior to therapies.  3. Mood: seems to be dealing with situation appropriately. Provide ego support. Will have LCSW follow for evaluation/support.  4. Neuropsych: This patient is capable of making decisions on her own behalf.  5. ESRD: HD on M, W, F. Check daily weights with 1200 cc fluid restrictions.  6. DM type 2: Hypoglycemic episodes noted. Will adjust lantus insulin and titrate as needed. Monitor BS with ac/hs cbg checks.  7. CAD with ICM: Asymptomatic. On midodrine due to hypotension. Resumed ASA. Did not use plavix at home.   -recent tachycardia---denies symptoms   -EKG with ST, 94. Rate in 90's today   -observe for now 8. Anemia of chronic disease: On aranesp weekly with routine monitoring of labs in dialysis.  9. OSA: Resume CPAP--uses face mask at home.  10. Pseudomonas wound infection: Gentamicin thorough 07/29 for two week antibiotic therapy?  -continue abx for now given leukocytosis  -vac off---dry dressing to leg daily  LOS (Days) 3 A FACE TO FACE EVALUATION WAS PERFORMED  Charlyn Vialpando T 04/02/2013 8:38 AM

## 2013-04-02 NOTE — Progress Notes (Signed)
I have reviewed the progress note and in agreement.  

## 2013-04-02 NOTE — Progress Notes (Signed)
Social Work  Social Work Assessment and Plan  Patient Details  Name: [name] MRN: [id number] Date of Birth: [birth year]  Today's Date: 04/02/2013  Problem List:  Patient Active Problem List   Diagnosis Date Noted  . Unilateral AKA--left 03/30/2013  . Wound dehiscence, surgical--left groin 03/30/2013  . Leucocytosis 03/30/2013  . Hx of right BKA 03/30/2013  . Coronary atherosclerosis of native coronary artery 03/28/2013  . Sepsis 03/28/2013  . Anemia of chronic renal failure 03/28/2013  . Secondary hyperparathyroidism 03/28/2013  . Acute osteomyelitis of foot 03/25/2013  . Atherosclerotic PVD with ulceration 02/20/2013  . CVA (cerebral infarction) 02/12/2013  . Cardiomyopathy, ischemic 02/03/2013  . DM (diabetes mellitus), secondary, w/peripheral vascular complications 02/03/2013  . Pain in limb 01/31/2013  . PVD (peripheral vascular disease) 10/04/2012  . MGUS (monoclonal gammopathy of unknown significance) 07/11/2012  . Infected sebaceous cyst of Right posterior neck. 09/30/2011  . HYPERLIPIDEMIA 07/28/2010  . OBSTRUCTIVE SLEEP APNEA 07/27/2010  . ABNORMAL HEART RHYTHMS 07/27/2010  . End stage renal disease 07/27/2010   Past Medical History:  Past Medical History  Diagnosis Date  . Diabetes mellitus, type 2   . Secondary hyperparathyroidism   . OSA (obstructive sleep apnea)     NPSG 11.23.11-AHI 64.9/hr-central and obstructive apnea  . Ischemic cardiomyopathy     EF 25-30% 2011  . CAD (coronary artery disease)   . Pulmonary hypertension     Est PAsyst 60-Dr Mendel Ryder, off coumadin, CT neg for PE  . Hyperlipidemia   . Anemia   . Thyroid disease   . Pulmonary edema   . Peripheral vascular disease   . Sebaceous cyst   . 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 Velvet Bathe  . Irregular heart beat   . Shortness of breath     with exertion  . Hemodialysis patient     M,W,F  . TIA (transient ischemic attack) June 2014  . Ulcer of heel due to diabetes     left   . Kidney disease Dialysis MWF    ESRD secondary to DM, failed transplant 2006. Dialysis   Past Surgical History:  Past Surgical History  Procedure Laterality Date  . Coronary artery bypass graft  2011    3V  . Insertion of dialysis catheter    . 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 Nasal mucosal biopsy    . Ovarian cyst surgery    . 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 Laser retinal surgery  2011  . Kidney transplant      rejection  . Abdominal angiogram Left 01/25/13  . Colonoscopy    . Cardiac catheterization    . Eye surgery    . Femoral-popliteal bypass graft Left 02/20/2013    Procedure:  FEMORAL-to below knee POPLITEAL ARTERY bypasswith saphenous vein with intraoperative angiogram ;  Surgeon: Chuck Hint, MD;  Location: [facility] OR;  Service: Vascular;  Laterality: Left;  fem-below knee pop  . Amputation Left 03/27/2013    Procedure: AMPUTATION ABOVE KNEE;  Surgeon: Chuck Hint, MD;  Location: [facility] OR;  Service: Vascular;  Laterality: Left;   Social History:  reports that she has never smoked. She has never used smokeless tobacco. She reports that she does not drink alcohol or use illicit drugs.  Family / Support Systems Marital Status: Single Patient Roles: Other (Comment) (sister) Other Supports: brother, Cherylin Waguespack @ [phone] and sister-in-law, Machele Deihl @ ([phone];  also has 3 additional siblings plus two cousins (cousins both live in the same apartment complex) Anticipated Caregiver: Brother and his family Ability/Limitations of Caregiver: none Caregiver Availability: 24/7 Family Dynamics: pt describes all family members as very supportive and willing to provide any asssitance needed  Social History Preferred language: English Religion: Methodist Cultural Background: NA Education: college degree  Read: Yes Write: Yes Employment Status: Disabled Date  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  Admits she is saddened by loss of her leg, however, no s/s of true depression.  BDI=0.   Will montior adjust and offer peer support if interested Recent Psychosocial Issues: none Pyschiatric History: none Substance Abuse History: none  Patient / Family Perceptions, Expectations & Goals Pt/Family understanding of illness & functional limitations: pt able to offer detailed description of medical issues, procedures that preceded her AKA and of current functional limitations. Premorbid pt/family roles/activities: pt was independent PTA and managing in her handicapped accessible apt.  Driving herself to HD or using SCAT as needed. Anticipated changes in roles/activities/participation: Pt may requrie some intermittnet assistance initially.  Aware she may need to stay at brother's home Pt/family expectations/goals: Pt's main goal is to reach an independent level of function and be able to return directly to her own apt.  Community [employer] Agencies: None Premorbid Home Care/DME Agencies: Other (Comment) ([facility] following PTA) Transportation available at discharge: yes Resource referrals recommended: Support group (specify)  Discharge Planning Living Arrangements: Alone Support Systems: Other relatives Type of Residence: Private residence Insurance Resources: [employer] Financial Resources: SSD Financial Screen Referred: No Living Expenses: Rent Money Management: Patient Does the patient have any problems obtaining your medications?: No Home Management: pt  Patient/Family Preliminary Plans: pt  hopes to be able to go to her own apt, however, does have option of staying with bother if needed. Social Work Anticipated Follow Up Needs: HH/OP;Support Group Expected length of stay: 1-2 weeks  Clinical Impression Pleasant, oriented, motivated woman here after another LE amputation.  Denies any significant emotional distress but will monitor.  Good family support.  No concerns about d/c - has option to stay with brother and sister-in-law if more assistance needed.  Will follow for support and d/c planning.  Loeta Herst 04/02/2013, 10:46 PM

## 2013-04-02 NOTE — Progress Notes (Signed)
Note reviewed and accurately reflects treatment session.   

## 2013-04-02 NOTE — Progress Notes (Signed)
Occupational Therapy Session Note  Patient Details  Name: [name] MRN: [id number] Date of Birth: Nov 24, 1955  Today's Date: 04/02/2013  Short Term Goals: Week 1:  OT Short Term Goal 1 (Week 1): Pt will be able to transfer on/off elevated toiet with supervision from w/c. OT Short Term Goal 2 (Week 1): Pt will be able to don shorts from bed level with set up only. OT Short Term Goal 3 (Week 1): Pt will be able to sit on tub bench with steady assist (for a simulated transfer).  Skilled Therapeutic Interventions/Progress Updates:  Session Note: Time: 1030-1125 (55 mins) Pt reported pain. No rate given. Pt stated she received pain meds this AM.  Upon entering room, pt supine in bed. Pt->EOB. Pt stated she completed bathing/dressing tasks earlier this AM. From here, pt used lateral leans for donning pants. Pt donned R prosthesis. OT educated pt on squat pivot transfer. Pt transferred->w/c using side scoots (supervision). Pt propelled w/c->gym. From here skilled intervention focused on fxal transfers, mini scoots on mat for increased independence with bed mobility, BUE strengthening/endurance, w/c mobility, and overall activity tolerance. Pt reported "cramping" in L residual limb. OT discussed techniques to relieve this phantom sensation. Towards the end of the session, pt c/o light-headedness. OT checked BP (129/69) and RHR (103). OT notified RN. NT checked blood sugar (155). From here, pt transferred->w/c using side scoots (OT held w/c). Pt propelled w/c->room using BUE's. Pt takes more than a reasonable amount of time to complete tasks. Pt had difficulty following simple conversation. At end of tx session, pt seated in w/c with call bell and phone within reach.  Therapy Documentation Precautions:  Precautions Precautions: Fall Precaution Comments: new L AKA, old R BKA,  Restrictions Weight Bearing Restrictions: Yes LLE Weight Bearing: Non weight bearing  See FIM for current functional  status  Therapy/Group: Individual Therapy  Lindley Stachnik 04/02/2013, 7:29 AM

## 2013-04-02 NOTE — Progress Notes (Signed)
Patient information reviewed and entered into eRehab system by Shellee Streng, RN, CRRN, PPS Coordinator.  Information including medical coding and functional independence measure will be reviewed and updated through discharge.     Per nursing patient was given "Data Collection Information Summary for Patients in Inpatient Rehabilitation Facilities with attached "Privacy Act Statement-Health Care Records" upon admission.  

## 2013-04-03 ENCOUNTER — Inpatient Hospital Stay (HOSPITAL_COMMUNITY): Payer: Medicare Other

## 2013-04-03 ENCOUNTER — Inpatient Hospital Stay (HOSPITAL_COMMUNITY): Payer: Medicare Other | Admitting: Physical Therapy

## 2013-04-03 ENCOUNTER — Inpatient Hospital Stay (HOSPITAL_COMMUNITY): Payer: Medicare Other | Admitting: Occupational Therapy

## 2013-04-03 LAB — CBC
HCT: 32.2 % — ABNORMAL LOW (ref 36.0–46.0)
Hemoglobin: 10.4 g/dL — ABNORMAL LOW (ref 12.0–15.0)
MCH: 27.6 pg (ref 26.0–34.0)
MCHC: 32.3 g/dL (ref 30.0–36.0)

## 2013-04-03 LAB — GLUCOSE, CAPILLARY
Glucose-Capillary: 85 mg/dL (ref 70–99)
Glucose-Capillary: 91 mg/dL (ref 70–99)
Glucose-Capillary: 85 mg/dL (ref 70–99)

## 2013-04-03 LAB — PHOSPHORUS: Phosphorus: 2.9 mg/dL (ref 2.3–4.6)

## 2013-04-03 LAB — COMPREHENSIVE METABOLIC PANEL
BUN: 8 mg/dL (ref 6–23)
Calcium: 7.9 mg/dL — ABNORMAL LOW (ref 8.4–10.5)
Creatinine, Ser: 2.9 mg/dL — ABNORMAL HIGH (ref 0.50–1.10)
GFR calc Af Amer: 19 mL/min — ABNORMAL LOW (ref >90)
Glucose, Bld: 94 mg/dL (ref 70–99)
Total Protein: 7.1 g/dL (ref 6.0–8.3)

## 2013-04-03 NOTE — Progress Notes (Signed)
VASCULAR PROGRESS NOTE  SUBJECTIVE: Comfortable. Tired.   PHYSICAL EXAM: Filed Vitals:   04/02/13 2110 04/02/13 2145 04/03/13 0509 04/03/13 1412  BP: 119/65 117/70 106/67 103/61  Pulse: 98 90 98 101  Temp:  98.1 F (36.7 C) 98.9 F (37.2 C) 98.3 F (36.8 C)  TempSrc:  Oral Oral Oral  Resp: 18 20 18 18   Height:      Weight:  180 lb 12.4 oz (82 kg) 180 lb 5.4 oz (81.8 kg)   SpO2: 98% 93% 92% 97%   Dressing left AKA is dry.  CBG (last 3)   Recent Labs  04/02/13 2346 04/03/13 0718 04/03/13 1116  GLUCAP 90 91 85   ASSESSMENT AND PLAN:  * POD 7 Left AKA   * Continues to make progress in Rehab.   Cari Caraway Beeper: [phone] 04/03/2013

## 2013-04-03 NOTE — Progress Notes (Signed)
Physical Therapy Session Note  Patient Details  Name: [name] MRN: [id number] Date of Birth: March 19, 1956  Today's Date: 04/03/2013 Time: 2536-6440 Time Calculation (min): 32 min  Short Term Goals: Week 1:  PT Short Term Goal 1 (Week 1): STG=LTG, overall Mod I for transfers and WC mobility  Skilled Therapeutic Interventions/Progress Updates:    Pt asleep upon entry, easy to wake, had not yet ate lunch due to sleeping. Pt missed of tx session due to eating. Pt demonstrated ability to perform posterior t/f from bed>w/c w/ close (S) and min v/c for sequencing. Pt able to don prosthesis, req (S) for obtaining prosthesis and lining. Pt able to propel manual w/c w/ B UE 200'x2 w/ (S). Focus this tx session on boosting bottom and WB through R LE to increase confidence for performance of squat pivot transfers bed<>w/c or w/c<>car. Chair placed in front of pt during boosting activity to increase level of support/safety in front as well as to encourage forward lean w/ B UE placed on arm rests of chair. Boosting continues to remain cognitively challenging to pt due to fear of falling/R LE giving out.   Therapy Documentation Precautions:  Precautions Precautions: Fall Precaution Comments: new L AKA, old R BKA,  Restrictions Weight Bearing Restrictions: Yes LLE Weight Bearing: Non weight bearing General: Amount of Missed PT Time (min): 13 Minutes Missed Time Reason: Other (comment) (Pt eating lunch)  Pain: Pain Assessment Pain Assessment: No/denies pain  See FIM for current functional status  Therapy/Group: Individual Therapy  Denzil Hughes 04/03/2013, 3:37 PM

## 2013-04-03 NOTE — Progress Notes (Signed)
Subjective: Interval History: none.  Objective: Vital signs in last 24 hours: Temp:  [98 F (36.7 C)-98.9 F (37.2 C)] 98.9 F (37.2 C) (07/22 0509) Pulse Rate:  [86-101] 98 (07/22 0509) Resp:  [17-20] 18 (07/22 0509) BP: (100-120)/(57-72) 106/67 mmHg (07/22 0509) SpO2:  [92 %-98 %] 92 % (07/22 0509) Weight:  [81.8 kg (180 lb 5.4 oz)-84 kg (185 lb 3 oz)] 81.8 kg (180 lb 5.4 oz) (07/22 0509) Weight change: 0.4 kg (14.1 oz)  Intake/Output from previous day: 07/21 0701 - 07/22 0700 In: 30 [P.O.:30] Out: 1937  Intake/Output this shift: Total I/O In: 25 [P.O.:25] Out: -   General appearance: alert, cooperative and moderately obese Resp: clear to auscultation bilaterally Cardio: S1, S2 normal and systolic murmur: systolic ejection 2/6, decrescendo at 2nd left intercostal space GI: obese,pos bs, liver down 4 cm Extremities: new Rocha AKA,R BKA, Rocha groin wound, RUA  AVF  Lab Results:  Recent Labs  04/03/13 0135  WBC 6.2  HGB 10.4*  HCT 32.2*  PLT 177   BMET:  Recent Labs  04/03/13 0135  NA 135  K 3.1*  CL 97  CO2 31  GLUCOSE 94  BUN 8  CREATININE 2.90*  CALCIUM 7.9*   No results found for this basename: PTH,  in the last 72 hours Iron Studies: No results found for this basename: IRON, TIBC, TRANSFERRIN, FERRITIN,  in the last 72 hours  Studies/Results: No results found.  I have reviewed the patient's current medications.  Assessment/Plan: 1 CRF stable , for HD, on Wed 2 Anemia on epo 3 Sepsis/groin wound 1 more week AB 4 DM controlled 5 PVD per [facility]VS 6 Debill per rehab P HD, epo, Gent, wound care, rehab    LOS: 4 days   [name] 04/03/2013,12:18 PM

## 2013-04-03 NOTE — Progress Notes (Signed)
Occupational Therapy Note  Patient Details  Name: [name] MRN: [id number] Date of Birth: 06/14/1956 Today's Date: 04/03/2013  Time: 1100-1128 Pt denies pain Individual Therapy  Pt in bed resting upon arrival.  Focus on bed mobility with bed flat and side rails down.  Pt practiced supine<>sit x 5.  Discussed discharge plans and recommendations. Pt completed all supine<>sit with supervision.   Lavone Neri [facility] 04/03/2013, 11:28 AM

## 2013-04-03 NOTE — Progress Notes (Signed)
Occupational Therapy Session Note  Patient Details  Name: [name] MRN: [id number] Date of Birth: [birth year]  Today's Date: 04/03/2013  Short Term Goals: Week 1:  OT Short Term Goal 1 (Week 1): Pt will be able to transfer on/off elevated toiet with supervision from w/c. OT Short Term Goal 2 (Week 1): Pt will be able to don shorts from bed level with set up only. OT Short Term Goal 3 (Week 1): Pt will be able to sit on tub bench with steady assist (for a simulated transfer).  Skilled Therapeutic Interventions/Progress Updates:  Session Note: Time: [phone] (65 mins) Pt reported 7/10 pain in L residual limb. RN made aware.  Upon entering room, pt supine in bed asleep. Pt rolled->R side to collect clothing from drawers located next to bed. Pt->EOB. Pt engaged in ADL retraining from this level. Pt used lat leans for peri care. Pt donned shirt. RN notified of pt's skin on L residual limb. Pt brushed teeth EOB. Following completion of nrsg care, pt donned underwear/pants and used lat leans to assist OT in pulling up pants. OT donned L limb sleeve. Pt takes more than a reasonable amount of time to complete tasks. At end of tx session, pt seated EOB eating breakfast. Bed alarm on.  Therapy Documentation Precautions:  Precautions Precautions: Fall Precaution Comments: new L AKA, old R BKA,  Restrictions Weight Bearing Restrictions: Yes LLE Weight Bearing: Non weight bearing  See FIM for current functional status  Therapy/Group: Individual Therapy  [name] 04/03/2013, 12:16 PM

## 2013-04-03 NOTE — Progress Notes (Addendum)
Subjective/Complaints: No new issues. Slept well again. Therapy went well yesterday. Sugars low  A 12 point review of systems has been performed and if not noted above is otherwise negative.   Objective: Vital Signs: Blood pressure 106/67, pulse 98, temperature 98.9 F (37.2 C), temperature source Oral, resp. rate 18, height 5\' 9"  (1.753 m), weight 81.8 kg (180 lb 5.4 oz), SpO2 92.00%. No results found.  Recent Labs  04/03/13 0135  WBC 6.2  HGB 10.4*  HCT 32.2*  PLT 177    Recent Labs  04/03/13 0135  NA 135  K 3.1*  CL 97  GLUCOSE 94  BUN 8  CREATININE 2.90*  CALCIUM 7.9*   CBG (last 3)   Recent Labs  04/02/13 2154 04/02/13 2346 04/03/13 0718  GLUCAP 69* 90 91    Wt Readings from Last 3 Encounters:  04/03/13 81.8 kg (180 lb 5.4 oz)  03/30/13 84 kg (185 lb 3 oz)  03/30/13 84 kg (185 lb 3 oz)    Physical Exam:  Nursing note and vitals reviewed.  Constitutional: She is oriented to person, place, and time. She appears well-developed and well-nourished.  HENT: oral mucosa pink and moist. Dentition borderline  Head: Normocephalic and atraumatic.  Eyes: Conjunctivae are normal. Pupils are equal, round, and reactive to light.  Neck: Normal range of motion.  Cardiovascular: HR 100-110, regular rhythm. +gallop Pulmonary/Chest: Effort normal and breath sounds normal. No respiratory distress. She has no wheezes.  Abdominal: Soft. Bowel sounds are normal. She exhibits no distension.  Musculoskeletal: She exhibits no tenderness.  R-BKA stump well healed.  L-AKA with staples/intact/minimal drainage. Left stump is appropriately tender.  Leg is edematous.  Neurological: She is alert and oriented to person, place, and time.  Alert and oriented. No cognitive deficits. No gross sensory loss. UE's are 4+/5 prox to distal. RLE is 4/5 at hip/knee. Can lift left hip agst gravity.  Skin: Skin is warm and dry.  Psychiatric: She has a normal mood and affect. Her speech is normal  and behavior is normal. Judgment and thought content normal. Cognition and memory are normal.   Assessment/Plan: 1. 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 Physiatrist is providing close team supervision and 24 hour management of active medical problems listed below. Physiatrist and rehab team continue to assess barriers to discharge/monitor patient progress toward functional and medical goals.  Stump sock per Advanced.   FIM: FIM - Bathing Bathing Steps Patient Completed: Chest;Right Arm;Left Arm;Abdomen;Front perineal area;Buttocks;Right upper leg;Left upper leg (from bed level) Bathing: 0: Activity did not occur  FIM - Upper Body Dressing/Undressing Upper body dressing/undressing steps patient completed: Thread/unthread right bra strap;Thread/unthread left bra strap;Thread/unthread right sleeve of pullover shirt/dresss;Thread/unthread left sleeve of pullover shirt/dress;Put head through opening of pull over shirt/dress;Pull shirt over trunk Upper body dressing/undressing: 0: Activity did not occur FIM - Lower Body Dressing/Undressing Lower body dressing/undressing steps patient completed: Thread/unthread right underwear leg;Thread/unthread left underwear leg;Thread/unthread right pants leg;Thread/unthread left pants leg Lower body dressing/undressing: 0: Activity did not occur  FIM - Toileting Toileting: 0: Activity did not occur  FIM - Archivist Transfers: 0-Activity did not occur  FIM - Banker Devices: Arm rests;Bed rails Bed/Chair Transfer: 4: Chair or W/C > Bed: Min A (steadying Pt. > 75%);4: Bed > Chair or W/C: Min A (steadying Pt. > 75%);5: Sit > Supine: Supervision (verbal cues/safety  issues)  FIM - Locomotion: Wheelchair Distance: 150 Locomotion: Wheelchair: 5: Travels 150 ft or more:  maneuvers on rugs and over door sills with supervision, cueing or coaxing FIM - Locomotion: Ambulation Locomotion: Ambulation: 0: Activity did not occur  Comprehension Comprehension Mode: Auditory Comprehension: 4-Understands basic 75 - 89% of the time/requires cueing 10 - 24% of the time  Expression Expression Mode: Verbal Expression: 5-Expresses basic 90% of the time/requires cueing < 10% of the time.  Social Interaction Social Interaction: 5-Interacts appropriately 90% of the time - Needs monitoring or encouragement for participation or interaction.  Problem Solving Problem Solving: 5-Solves basic 90% of the time/requires cueing < 10% of the time  Memory Memory: 4-Recognizes or recalls 75 - 89% of the time/requires cueing 10 - 24% of the time Medical Problem List and Plan:  1. DVT Prophylaxis/Anticoagulation: Pharmaceutical: Lovenox  2. Pain Management: Continues to have significant pain L-AKA. Will monitor and adjust. Premedicated prior to therapies.  3. Mood: seems to be dealing with situation appropriately. Provide ego support. Will have LCSW follow for evaluation/support.  4. Neuropsych: This patient is capable of making decisions on her own behalf.  5. ESRD: HD on M, W, F. Check daily weights with 1200 cc fluid restrictions.  6. DM type 2: Hypoglycemic episodes noted.off lantus and not on CM diet. Monitor BS with ac/hs cbg checks. 7. CAD with ICM: Asymptomatic. On midodrine due to hypotension. Resumed ASA. Did not use plavix at home.   -recent tachycardia---denies symptoms   -EKG with ST, 94. Rate in 80's to 90's today   -observe for now 8. Anemia of chronic disease: On aranesp weekly with routine monitoring of labs in dialysis.  9. OSA: Resume CPAP--uses face mask at home.  10. Pseudomonas wound infection: Gentamicin thorough 07/29 for two week antibiotic therapy?  -continue abx for now given leukocytosis  -dry dressing , stump sock  LOS (Days) 4 A FACE TO FACE  EVALUATION WAS PERFORMED  Lynel Forester T 04/03/2013 8:21 AM

## 2013-04-03 NOTE — Progress Notes (Signed)
Physical Therapy Session Note  Patient Details  Name: [name] MRN: [id number] Date of Birth: 02-08-1956  Today's Date: 04/03/2013 Time: 4098-1191 Time Calculation (min): 61 min  Short Term Goals: Week 1:  PT Short Term Goal 1 (Week 1): STG=LTG, overall Mod I for transfers and WC mobility  Skilled Therapeutic Interventions/Progress Updates:    Therapy session focused on functional transfers, w/c propulsion, and therapeutic exercise. Pt performed w/c<>car squat pivot transfer with mod A (w/c>car)-Max A (car-w/c) with VC's for set up and getting COM over BOS. Pt performed w/c propulsion for >150 ft with S for cardiovascular endurance and UE strengthening. Pt completed w/c<>matt table squat pivot transfer with min A and consistent VC's for getting COM over BOS when transferring. Pt completed scooting exercise in B/L direction (5 x per side) for part task training of squat pivot transfer and to work on getting pt COM over BOS to un-weight hips to pivot during transfer. Pt performed mat table exercises that consisted of hip extension (supine), hip ABD (S/L), and glut squeezes 1 set x 10 reps to assist with LE strengthening with functional transfers and mobility. Pt performed bed mobility with close S (sit > supine) - min A (supine >sit) with VC for UE placement when performing supine to sit. Pt reports 7/10 pain level in the R distal residual limb and states that she was only able to eat half her muffin for breakfast this morning (due to lost dentures) therefore nursing staff was notified and pain medication and protein shake requested.  Overall BP remained around 100 BPM during therapy session reaching up to 105 BPM with functional mobility.   Therapy Documentation Precautions:  Precautions Precautions: Fall Precaution Comments: new L AKA, old R BKA,  Restrictions Weight Bearing Restrictions: Yes LLE Weight Bearing: Non weight bearing  See FIM for current functional status  Therapy/Group:  Individual Therapy  [name], Keydi Giel 04/03/2013, 8:47 AM

## 2013-04-03 NOTE — Progress Notes (Signed)
Pt. Refuses CPAP at this time. Pt. Stated that someone was supposed to bring her home CPAP in today but didn't. She plans to get someone to bring her home CPAP tomorrow. Pt. Was made aware to let RT or RN know if she decided to use one of our CPAP's tonight.

## 2013-04-03 NOTE — Progress Notes (Signed)
Patient information reviewed and entered into eRehab system by Shahzaib Azevedo, RN, CRRN, PPS Coordinator.  Information including medical coding and functional independence measure will be reviewed and updated through discharge.     Per nursing patient was given "Data Collection Information Summary for Patients in Inpatient Rehabilitation Facilities with attached "Privacy Act Statement-Health Care Records" upon admission.  

## 2013-04-03 NOTE — Progress Notes (Signed)
Note reviewed and accurately reflects treatment session.   

## 2013-04-03 NOTE — Progress Notes (Signed)
Rt spoke with pt about cpap.  Pt states she wears he cpap at home occasionally (a few nights with, then a few nights without).  RT expolained to pt about bringing in her home cpap since she didn't want to use ours tonight.  Rt spoke with RN and asked her to call if she felt pt neeeded to be placed on at anytime in the night and told pt to call RN if she felt she needed to be placed on.  Pt states she will have someone bring in her home cpap tomorrow.

## 2013-04-03 NOTE — Progress Notes (Signed)
Reviewed and in agreement with treatment provided.  

## 2013-04-03 NOTE — Progress Notes (Signed)
Pt requested to be dig stim prior to outing in the community. Results were large, soft stool in brief. No unplanned episode reported after dig stim. Suppository given at 1800, pt dig stim at 1830. Medium, pasty stool in brief.

## 2013-04-03 NOTE — Progress Notes (Signed)
ANTIBIOTIC CONSULT NOTE - FOLLOW UP  Pharmacy Consult for Gentamicin Indication: extensive osteomyelitis, now s/p L-AKA  Allergies  Allergen Reactions  . Neurontin (Gabapentin) Anaphylaxis  . Ezetimibe-Simvastatin Other (See Comments)    REACTION: face edema; itching  . Klonopin (Clonazepam) Other (See Comments)    hallucination  . Sertraline Hcl Other (See Comments)    hallucination    Patient Measurements: Weight: 84 kg  Labs:  Recent Labs  04/03/13 0135  WBC 6.2  HGB 10.4*  PLT 177  CREATININE 2.90*   Estimated Creatinine Clearance: 24.2 ml/min (by C-G formula based on Cr of 2.9). No results found for this basename: VANCOTROUGH, Leodis Binet, VANCORANDOM, GENTTROUGH, GENTPEAK, GENTRANDOM, TOBRATROUGH, TOBRAPEAK, TOBRARND, AMIKACINPEAK, AMIKACINTROU, AMIKACIN,  in the last 72 hours   Microbiology: Recent Results (from the past 720 hour(s))  CULTURE, BLOOD (ROUTINE X 2)     Status: None   Collection Time    03/25/13  9:17 AM      Result Value Range Status   Specimen Description BLOOD LEFT ARM   Final   Special Requests BOTTLES DRAWN AEROBIC ONLY 10CC   Final   Culture  Setup Time 03/25/2013 20:46   Final   Culture NO GROWTH 5 DAYS   Final   Report Status 03/31/2013 FINAL   Final  CULTURE, BLOOD (ROUTINE X 2)     Status: None   Collection Time    03/25/13  9:22 AM      Result Value Range Status   Specimen Description BLOOD LEFT HAND   Final   Special Requests BOTTLES DRAWN AEROBIC ONLY 3CC   Final   Culture  Setup Time 03/25/2013 20:46   Final   Culture NO GROWTH 5 DAYS   Final   Report Status 03/31/2013 FINAL   Final  WOUND CULTURE     Status: None   Collection Time    03/26/13  1:39 PM      Result Value Range Status   Specimen Description WOUND LEFT FOOT   Final   Special Requests LEFT HEEL   Final   Gram Stain     Final   Value: NO WBC SEEN     NO SQUAMOUS EPITHELIAL CELLS SEEN     NO ORGANISMS SEEN   Culture ABUNDANT PSEUDOMONAS AERUGINOSA   Final   Report Status 03/30/2013 FINAL   Final   Organism ID, Bacteria PSEUDOMONAS AERUGINOSA   Final  SURGICAL PCR SCREEN     Status: None   Collection Time    03/27/13  1:25 AM      Result Value Range Status   MRSA, PCR NEGATIVE  NEGATIVE Final   Staphylococcus aureus NEGATIVE  NEGATIVE Final   Comment:            The Xpert SA Assay (FDA     approved for NASAL specimens     in patients over 58 years of age),     is one component of     a comprehensive surveillance     program.  Test performance has     been validated by [employer] for patients greater     than or equal to 53 year old.     It is not intended     to diagnose infection nor to     guide or monitor treatment.    Anti-infectives   Start     Dose/Rate Route Frequency Ordered Stop   04/02/13 2000  gentamicin (GARAMYCIN) 160 mg in  dextrose 5 % 50 mL IVPB     160 mg 108 mL/hr over 30 Minutes Intravenous Every M-W-F (2000) 03/31/13 1610     04/02/13 1200  gentamicin (GARAMYCIN) 140 mg in dextrose 5 % 50 mL IVPB  Status:  Discontinued     140 mg 107 mL/hr over 30 Minutes Intravenous Every M-W-F (Hemodialysis) 03/30/13 1643 03/31/13 0939   03/30/13 1700  gentamicin (GARAMYCIN) 140 mg in dextrose 5 % 50 mL IVPB     140 mg 107 mL/hr over 30 Minutes Intravenous  Once 03/30/13 1631 03/30/13 2143      Assessment: 57 y.o. F who continues on Gentamicin for morganella and PSA from 7/9 BCx (per MD note). Pt also with PSA wound infection/osteo this admission -- s/p L-AKA on 7/15. The patient is noted to be ESRD and receives HD on MWF. The patient was loaded with Gentamicin 200 mg (~ 3mg /kg IBW) on 7/16 and currently on Gentamicin 160 mg Q HD. Pt. Received HD yesterday evening and tolerated 4 hr HD at BFR = 400 ml/min, gentamicin dose charted at appropriate time. Afebrile, wbc 6/2.  Goal of Therapy:  Pre-HD gentamicin level <4 mcg/ml with normal HD session  Plan:  1. Continue Gentamicin 160 mg IV post HD sessions on MWF 2. Will  continue to follow HD schedule/duration, culture results, LOT, and antibiotic de-escalation plans   Bayard Hugger, PharmD, BCPS  Clinical Pharmacist  Pager: [phone]   04/03/2013 1:50 PM

## 2013-04-03 NOTE — Progress Notes (Signed)
[facility] Individual Statement of Services  Patient Name:  [name]  Date:  04/03/2013  Welcome to the [facility].  Our goal is to provide you with an individualized program based on your diagnosis and situation, designed to meet your specific needs.  With this comprehensive rehabilitation program, you will be expected to participate in at least 3 hours of rehabilitation therapies Monday-Friday, with modified therapy programming on the weekends.  Your rehabilitation program will include the following services:  Physical Therapy (PT), Occupational Therapy (OT), 24 hour per day rehabilitation nursing, Therapeutic Recreaction (TR), Case Management (Social Worker), Rehabilitation Medicine, Nutrition Services and Pharmacy Services  Weekly team conferences will be held on Tuesdays to discuss your progress.  Your Social Worker will talk with you frequently to get your input and to update you on team discussions.  Team conferences with you and your family in attendance may also be held.  Expected length of stay: 10-12 days  Overall anticipated outcome: modified independent w/c  Depending on your progress and recovery, your program may change. Your Social Worker will coordinate services and will keep you informed of any changes. Your Social Worker's name and contact numbers are listed  below.  The following services may also be recommended but are not provided by the [facility]:   Driving Evaluations  Home Health Rehabiltiation Services  Outpatient Rehabilitatation Servives    Arrangements will be made to provide these services after discharge if needed.  Arrangements include referral to agencies that provide these services.  Your insurance has been verified to be:  Medicare Your primary doctor is:  Dr. Darrick Penna  Pertinent information will be shared with your doctor and your insurance company.  Social Worker:  [place],  [place] [phone] or [phone]  Information discussed with and copy given to patient by: [name] Jupiter, 04/03/2013, 8:44 AM

## 2013-04-04 ENCOUNTER — Inpatient Hospital Stay (HOSPITAL_COMMUNITY): Payer: Medicare Other

## 2013-04-04 ENCOUNTER — Inpatient Hospital Stay (HOSPITAL_COMMUNITY): Payer: Medicare Other | Admitting: Occupational Therapy

## 2013-04-04 ENCOUNTER — Inpatient Hospital Stay (HOSPITAL_COMMUNITY): Payer: Medicare Other | Admitting: *Deleted

## 2013-04-04 DIAGNOSIS — I739 Peripheral vascular disease, unspecified: Secondary | ICD-10-CM

## 2013-04-04 DIAGNOSIS — N186 End stage renal disease: Secondary | ICD-10-CM

## 2013-04-04 DIAGNOSIS — L98499 Non-pressure chronic ulcer of skin of other sites with unspecified severity: Secondary | ICD-10-CM

## 2013-04-04 DIAGNOSIS — S78119A Complete traumatic amputation at level between unspecified hip and knee, initial encounter: Secondary | ICD-10-CM

## 2013-04-04 LAB — CBC
MCH: 27.6 pg (ref 26.0–34.0)
Platelets: 151 10*3/uL (ref 150–400)
RBC: 3.7 MIL/uL — ABNORMAL LOW (ref 3.87–5.11)
WBC: 6 10*3/uL (ref 4.0–10.5)

## 2013-04-04 LAB — GLUCOSE, CAPILLARY

## 2013-04-04 MED ORDER — LIDOCAINE HCL (PF) 1 % IJ SOLN: 5.0000 mL | INTRAMUSCULAR | Status: DC | PRN

## 2013-04-04 MED ORDER — NEPRO/CARBSTEADY PO LIQD: 237.0000 mL | ORAL | Status: DC | PRN

## 2013-04-04 MED ORDER — HEPARIN SODIUM (PORCINE) 1000 UNIT/ML DIALYSIS
100.0000 [IU]/kg | INTRAMUSCULAR | Status: DC | PRN
  Administered 2013-04-04: 8200 [IU] via INTRAVENOUS_CENTRAL

## 2013-04-04 MED ORDER — HEPARIN SODIUM (PORCINE) 1000 UNIT/ML DIALYSIS: 1000.0000 [IU] | INTRAMUSCULAR | Status: DC | PRN

## 2013-04-04 MED ORDER — SODIUM CHLORIDE 0.9 % IV SOLN: 100.0000 mL | INTRAVENOUS | Status: DC | PRN

## 2013-04-04 MED ORDER — PENTAFLUOROPROP-TETRAFLUOROETH EX AERO: 1.0000 "application " | INHALATION_SPRAY | CUTANEOUS | Status: DC | PRN

## 2013-04-04 MED ORDER — ALTEPLASE 2 MG IJ SOLR: 2.0000 mg | Freq: Once | INTRAMUSCULAR | Status: DC | PRN

## 2013-04-04 MED ORDER — LIDOCAINE-PRILOCAINE 2.5-2.5 % EX CREA: 1.0000 "application " | TOPICAL_CREAM | CUTANEOUS | Status: DC | PRN

## 2013-04-04 NOTE — Progress Notes (Signed)
Occupational Therapy Session Notes  Patient Details  Name: [name] MRN: [id number] Date of Birth: 1955/12/13  Today's Date: 04/04/2013  Short Term Goals: Week 1:  OT Short Term Goal 1 (Week 1): Pt will be able to transfer on/off elevated toiet with supervision from w/c. OT Short Term Goal 2 (Week 1): Pt will be able to don shorts from bed level with set up only. OT Short Term Goal 3 (Week 1): Pt will be able to sit on tub bench with steady assist (for a simulated transfer).  Skilled Therapeutic Interventions/Progress Updates:  Session Note #1: Time: 0730-0830 (60 mins) Pt with no report of pain.  Upon entering room, pt supine in bed. RN in room administering orange juice secondary to pt's low blood sugar. Pt rolled to R side to collect clothing from drawers located next to bed. Pt->EOB where she engaged in ADL retraining. During session, NT re-checked blood sugar (=70). Pt used lat leans/rolling side<>side for peri care and pulling up pants. OT donned L limb sleeve. Pt donned R prosthesis after set-up. OT educated pt on squat pivot transfer but pt unable to use this transfer technique->w/c. Pt transferred->w/c using side scoots (supervision). OT educated pt on benefits of using squat pivot vs side scoots (e.g. Increased likelihood of w/c tipping over, increased safety, and RLE strengthening). From here, pt transferred<>elevated toilet seat using grab bars. Again, pt unable to use squat pivot technique->toilet despite verbal and physical cues from OT. Pt did use squat pivot technique->w/c (mod assist). Pt completed grooming tasks seated in w/c at sink. During tx session, pt stated she understands that therapies want her to use squat pivot technique but she just is not able to. At end of tx session, pt seated in w/c with call bell within reach. Tray in front with pt eating breakfast.  Session Note #2: Time: 1300-1345 (45 mins) Pt reported 6/10 pain in L residual limb. RN administered pain  meds.  Upon entering room, pt supine in bed asleep. Pt->EOB and donned R prosthesis after set-up. Pt transferred->w/c using side scoots (supervision). Pt propelled w/c->rehab gym using BUE's (~150 ft). From here, skilled intervention focused on fxal transfers, BUE/RLE strengthening/endurance (tricep block push-ups, mini squats), education on various transfer techniques, and overall activity tolerance. Pt pushed w/c backwards->room using RLE. At end of tx session, pt seated in w/c with call bell within reach. Tray in front with lunch.  Therapy Documentation Precautions:  Precautions Precautions: Fall Precaution Comments: new L AKA, old R BKA,  Restrictions Weight Bearing Restrictions: Yes LLE Weight Bearing: Non weight bearing  See FIM for current functional status  Therapy/Group: Individual Therapy  Oaklyn Mans 04/04/2013, 7:29 AM

## 2013-04-04 NOTE — Progress Notes (Signed)
Pt. Refused cpap. Pt. States she does not want to wear cpap and that she has not been wearing cpap at home either. Pt. States she will notify if she changes her mind and decides to wear cpap.

## 2013-04-04 NOTE — Significant Event (Signed)
Hypoglycemic Event  CBG: 64  Treatment: 15 GM carbohydrate snack  Symptoms: None  Follow-up CBG: Time:70 CBG Result:0757  Possible Reasons for Event: Unknown  Comments/MD notified:Pam Love, PA notified; no new orders received.    Neva Seat  Remember to initiate Hypoglycemia Order Set & complete

## 2013-04-04 NOTE — Progress Notes (Signed)
Subjective: In good spirits, talking with counselor   Recent Labs Lab 03/30/13 0822 04/03/13 0135 04/04/13 0540  WBC 15.1* 6.2 6.0  HGB 9.8* 10.4* 10.2*  HCT 30.0* 32.2* 31.7*  MCV 84.7 85.4 85.7  PLT 191 177 151    Recent Labs Lab 03/29/13 0525 03/30/13 0752 04/03/13 0135  NA 133* 130* 135  K 4.2 4.6 3.1*  CL 95* 94* 97  CO2 27 24 31   GLUCOSE 131* 65* 94  BUN 11 16 8   CREATININE 3.42* 4.61* 2.90*  CALCIUM 9.0 8.9 7.9*  PHOS  --  5.0* 2.9   Physical Exam:  Blood pressure 119/72, pulse 96, temperature 98.4 F (36.9 C), temperature source Oral, resp. rate 18, height 5\' 9"  (1.753 m), weight 81.9 kg (180 lb 8.9 oz), SpO2 96.00%.  Gen: alert, cooperative and moderately obese  Neck: no jvd Resp: clear bilaterally  Card:  systolic ejection 2/6, decrescendo at 2nd left intercostal space  GI: obese, pos bs, liver down 4 cm  Extremities: new L AKA,R BKA, L groin wound, RUA AVF   Dialysis Orders ([facility] MWF)  4hrs   Dry wt 90.5kg    Heparin 2800    AVF    Profile 2  Hectorol 2ug       Epogen/Venofer none  July Labs: Phos 3.6, PTH 198.5. Tsat 24% with Ferritin > 2000   Assessment 1 CRF stable , HD today 2 Anemia on epo  3 GN sepsis/osteo L foot/groin wound, AB thru 7/29 (3 wks) 4 DM controlled  5 PVD per [facility] 6 L BKA 7/15 7 Debill per rehab   P HD, epo, Gent, wound care, rehab   Vinson Moselle  MD Pager [phone]    Cell  [phone] 04/04/2013, 11:03 AM

## 2013-04-04 NOTE — Progress Notes (Signed)
Subjective/Complaints: Feels well. Denies pain.  A 12 point review of systems has been performed and if not noted above is otherwise negative.   Objective: Vital Signs: Blood pressure 119/72, pulse 96, temperature 98.4 F (36.9 C), temperature source Oral, resp. rate 18, height 5\' 9"  (1.753 m), weight 81.9 kg (180 lb 8.9 oz), SpO2 96.00%. No results found.  Recent Labs  04/03/13 0135 04/04/13 0540  WBC 6.2 6.0  HGB 10.4* 10.2*  HCT 32.2* 31.7*  PLT 177 151    Recent Labs  04/03/13 0135  NA 135  K 3.1*  CL 97  GLUCOSE 94  BUN 8  CREATININE 2.90*  CALCIUM 7.9*   CBG (last 3)   Recent Labs  04/03/13 1621 04/03/13 2055 04/04/13 0714  GLUCAP 91 85 64*    Wt Readings from Last 3 Encounters:  04/04/13 81.9 kg (180 lb 8.9 oz)  03/30/13 84 kg (185 lb 3 oz)  03/30/13 84 kg (185 lb 3 oz)    Physical Exam:  Nursing note and vitals reviewed.  Constitutional: She is oriented to person, place, and time. She appears well-developed and well-nourished.  HENT: oral mucosa pink and moist. Dentition borderline  Head: Normocephalic and atraumatic.  Eyes: Conjunctivae are normal. Pupils are equal, round, and reactive to light.  Neck: Normal range of motion.  Cardiovascular: HR 100-110, regular rhythm. +gallop Pulmonary/Chest: Effort normal and breath sounds normal. No respiratory distress. She has no wheezes.  Abdominal: Soft. Bowel sounds are normal. She exhibits no distension.  Musculoskeletal: She exhibits no tenderness.  R-BKA stump well healed.  L-AKA with staples/intact/only small area of serous drainage centrally. Left stump is appropriately tender.  Leg is less edematous. Proximal wound site clean and granulating Neurological: She is alert and oriented to person, place, and time.  Alert and oriented. No cognitive deficits. No gross sensory loss. UE's are 4+/5 prox to distal. RLE is 4/5 at hip/knee. Can lift left hip agst gravity.  Skin: Skin is warm and dry.   Psychiatric: She has a normal mood and affect. Her speech is normal and behavior is normal. Judgment and thought content normal. Cognition and memory are normal.   Assessment/Plan: 1. 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 Physiatrist is providing close team supervision and 24 hour management of active medical problems listed below. Physiatrist and rehab team continue to assess barriers to discharge/monitor patient progress toward functional and medical goals.  Inconsistent at times with safety awareness. Team feels that supervision at home is required at dc.   FIM: FIM - Bathing Bathing Steps Patient Completed: Chest;Right Arm;Left Arm;Abdomen;Front perineal area;Buttocks;Right upper leg;Left upper leg Bathing: 5: Set-up assist to: Obtain items  FIM - Upper Body Dressing/Undressing Upper body dressing/undressing steps patient completed: Thread/unthread right bra strap;Thread/unthread left bra strap;Thread/unthread right sleeve of pullover shirt/dresss;Thread/unthread left sleeve of pullover shirt/dress;Put head through opening of pull over shirt/dress;Pull shirt over trunk Upper body dressing/undressing: 7: Complete Independence: No helper FIM - Lower Body Dressing/Undressing Lower body dressing/undressing steps patient completed:  (don prosthesis) Lower body dressing/undressing: 5: Set-up assist to: Obtain clothing  FIM - Toileting Toileting: 0: Activity did not occur  FIM - Archivist Transfers: 0-Activity did not occur  FIM - Banker Devices: Arm rests Bed/Chair Transfer: 5: Supine > Sit: Supervision (verbal cues/safety issues);5: Bed > Chair or W/C: Supervision (verbal cues/safety issues)  FIM -  Locomotion: Wheelchair Distance: 150 Locomotion: Wheelchair: 5: Travels 150 ft or more: maneuvers  on rugs and over door sills with supervision, cueing or coaxing FIM - Locomotion: Ambulation Locomotion: Ambulation: 0: Activity did not occur  Comprehension Comprehension Mode: Auditory Comprehension: 4-Understands basic 75 - 89% of the time/requires cueing 10 - 24% of the time  Expression Expression Mode: Verbal Expression: 5-Expresses basic 90% of the time/requires cueing < 10% of the time.  Social Interaction Social Interaction: 5-Interacts appropriately 90% of the time - Needs monitoring or encouragement for participation or interaction.  Problem Solving Problem Solving: 5-Solves basic 90% of the time/requires cueing < 10% of the time  Memory Memory: 4-Recognizes or recalls 75 - 89% of the time/requires cueing 10 - 24% of the time Medical Problem List and Plan:  1. DVT Prophylaxis/Anticoagulation: Pharmaceutical: Lovenox  2. Pain Management: Continues to have significant pain L-AKA. Will monitor and adjust. Premedicated prior to therapies.  3. Mood: seems to be dealing with situation appropriately. Provide ego support. Will have LCSW follow for evaluation/support.  4. Neuropsych: This patient is capable of making decisions on her own behalf.  5. ESRD: HD on M, W, F. Check daily weights with 1200 cc fluid restrictions.  6. DM type 2: Hypoglycemic episodes noted.off lantus and not on CM diet. Monitor BS with ac/hs cbg checks. 7. CAD with ICM: Asymptomatic. On midodrine due to hypotension. Resumed ASA. Did not use plavix at home.   -recent tachycardia---denies symptoms   -EKG with ST, 94. Rate in 80's to 90's in general 8. Anemia of chronic disease: On aranesp weekly with routine monitoring of labs in dialysis.  9. OSA: Resume CPAP--uses face mask at home.  10. Pseudomonas wound infection: Gentamicin thorough 07/29 for two week antibiotic therapy?  -continue abx for now given leukocytosis  -dry dressing , stump sock  LOS (Days) 5 A FACE TO FACE EVALUATION WAS  PERFORMED  SWARTZ,ZACHARY T 04/04/2013 8:27 AM

## 2013-04-04 NOTE — Progress Notes (Signed)
Physical Therapy Session Note  Patient Details  Name: [name] MRN: [id number] Date of Birth: 1956-03-22  Today's Date: 04/04/2013 Time: 1430-1500 Time Calculation (min): 30 min  Short Term Goals: Week 1:  PT Short Term Goal 1 (Week 1): STG=LTG, overall S for transfers and WC mobility mod I (  Skilled Therapeutic Interventions/Progress Updates:    Session focused on w/c propulsion on unit for endurance and strengthening of UE's, basic transfers (scoot vs. Squat pivot), and completed 1 sit to stand from elevated surface with +2 assist with RW. Education provided to pt on new LOS and BOS with amputation on LLE as pt with questions in regards to her ability to stand and eventually walk. Suggested in therapy session tomorrow to work in parallel bars as pt somewhat anxious with instability of RW.   Therapy Documentation Precautions:  Precautions Precautions: Fall Precaution Comments: new L AKA, old R BKA,  Restrictions Weight Bearing Restrictions: Yes LLE Weight Bearing: Non weight bearing   Pain: Pain Assessment Pain Assessment: 0-10 Pain Score: 8  Pain Type: Surgical pain Pain Location: Leg Pain Orientation: Left Pain Descriptors / Indicators: Aching Pain Frequency: Intermittent Pain Onset: With Activity Patients Stated Pain Goal: 4 Pain Intervention(s): Medication (See eMAR);Repositioned;Emotional support Multiple Pain Sites: No  See FIM for current functional status  Therapy/Group: Individual Therapy  Karolee Stamps [facility] 04/04/2013, 4:17 PM

## 2013-04-04 NOTE — Progress Notes (Signed)
Social Work Patient ID: [name], female   DOB: [birth year], 57 y.o.   MRN: [id number]  Have reviewed team conference with patient this morning.  Noted her discussion with PT about recommendation for her to d/c to brother's home initially and pt confirms this is her plan.  She will be talking with brother about putting up temp ramp as well.  Pt agreeable with targeted d/c date of 8/1 after HD.  Only concern noted for her is need for a new w/c - explained that I will take care of ordering this per PT specs/recs.  Continue to follow.  Collen Hostler, LCSW

## 2013-04-04 NOTE — Progress Notes (Signed)
Social Work Patient ID: [name], female   DOB: 1956/01/22, 57 y.o.   MRN: [id number]  Candace Jupiter, LCSW Social Worker Signed  Patient Care Conference Service date: 04/04/2013 10:27 AM  Inpatient RehabilitationTeam Conference and Plan of Care Update Date: 04/03/2013   Time: 2:05 PM     Patient Name: [name]       Medical Record Number: [id number]   Date of Birth: May 19, 1956 Sex: Female         Room/Bed: 4W25C/4W25C-01 Payor Info: Payor: MEDICARE / Plan: MEDICARE PART A AND B / Product Type: *No Product type* /   Admitting Diagnosis: R CVA   Admit Date/Time:  03/30/2013  3:33 PM Admission Comments: No comment available   Primary Diagnosis:  Unilateral AKA Principal Problem: Unilateral AKA    Patient Active Problem List     Diagnosis  Date Noted   .  Unilateral AKA--left  03/30/2013   .  Wound dehiscence, surgical--left groin  03/30/2013   .  Leucocytosis  03/30/2013   .  Hx of right BKA  03/30/2013   .  Coronary atherosclerosis of native coronary artery  03/28/2013   .  Sepsis  03/28/2013   .  Anemia of chronic renal failure  03/28/2013   .  Secondary hyperparathyroidism  03/28/2013   .  Acute osteomyelitis of foot  03/25/2013   .  Atherosclerotic PVD with ulceration  02/20/2013   .  CVA (cerebral infarction)  02/12/2013   .  Cardiomyopathy, ischemic  02/03/2013   .  DM (diabetes mellitus), secondary, w/peripheral vascular complications  02/03/2013   .  Pain in limb  01/31/2013   .  PVD (peripheral vascular disease)  10/04/2012   .  MGUS (monoclonal gammopathy of unknown significance)  07/11/2012   .  Infected sebaceous cyst of Right posterior neck.  09/30/2011   .  HYPERLIPIDEMIA  07/28/2010   .  OBSTRUCTIVE SLEEP APNEA  07/27/2010   .  ABNORMAL HEART RHYTHMS  07/27/2010   .  End stage renal disease  07/27/2010     Expected Discharge Date: Expected Discharge Date: 04/13/13  Team Members Present: Physician leading conference: Dr. Faith Rogue Nurse Present:  Daryll Brod, RN PT Present: Karolee Stamps, PT OT Present: Mackie Pai, OT;Patricia Mat Carne, OT Other (Discipline and Name): Tora Duck, PPS Coordinator        Current Status/Progress  Goal  Weekly Team Focus   Medical     new left AKA, prior right BKA with prosthesis  stabilize medically for dc/ to increase activity tolerance  pain mgt, wound care, tachycardia dx   Bowel/Bladder     Anuria-HD, M,W, F. Continent of bowel per report. LBM 03/30/13  Managed bowel  Regular bowel movement   Swallow/Nutrition/ Hydration            ADL's     Independent UB dressing, mod assist LB dressing, set-up bathing, set-up grooming  Overall mod I goals  fxal transfers, BADL's, IADL's, dynamic standing balance/endurance, UE strengthening, activity tolerance   Mobility     min A for squat pivot transfer, max-mod A for sit<>stand transfer using RW, S w/c propulsion, bed mobility min A-close S,   Mod I w/c level, mod I bed mobility, min A car transfer, S dynamic standing balance  Dynamic standing balance, functional transfers, bed mobility, w/c propulsion, LE/UE strengthening, activity tolerance/endurance   Communication            Safety/Cognition/ Behavioral Observations  Pain     Scheduled Oxy CR   <4  Offer pain medication 1 hr prior to initial therapy. Monitor for effectiveness   Skin     R BKA with prothesis. L AKA with staples and stump shrinker. Dehisced wound x 2 santyl ointment to area bid  No additional skin breakdown  Assess skin q shift. Continue with treatment to wounds    Rehab Goals Patient on target to meet rehab goals: Yes *See Care Plan and progress notes for long and short-term goals.    Barriers to Discharge:  safety.     Possible Resolutions to Barriers:    supervision at home      Discharge Planning/Teaching Needs:    pt hopeful she can return directly to her own apt which is handicapped accessible - can d/c to brother's home if some assistance needed.       Team Discussion:    Currently min- mod overall with w/c level activity.  Using prosthesis with transfers.  Some signs of slight confusion during sessions - monitor.  Bowel issues - encouraging use of laxatives.  Team recommends home with brother initially.   Revisions to Treatment Plan:    None    Continued Need for Acute Rehabilitation Level of Care: The patient requires daily medical management by a physician with specialized training in physical medicine and rehabilitation for the following conditions: Daily direction of a multidisciplinary physical rehabilitation program to ensure safe treatment while eliciting the highest outcome that is of practical value to the patient.: Yes Daily medical management of patient stability for increased activity during participation in an intensive rehabilitation regime.: Yes Daily analysis of laboratory values and/or radiology reports with any subsequent need for medication adjustment of medical intervention for : Post surgical problems;Neurological problems (renal)  [name] 04/04/2013, 10:27 AM

## 2013-04-04 NOTE — Progress Notes (Signed)
Physical Therapy Session Note  Patient Details  Name: [name] MRN: [id number] Date of Birth: 1955-12-22  Today's Date: 04/04/2013 Time: 0932-1032 Time Calculation (min): 60 min  Short Term Goals: Week 1:  PT Short Term Goal 1 (Week 1): STG=LTG, overall S for transfers and WC mobility mod I (  Skilled Therapeutic Interventions/Progress Updates:    Session focused on part task training for scooting to R and L EOB (started with push up blocks and progressed to travelling to R and L)  With focus on weightshift anterior and WB through RLE to aid with squat pivot transfer technique - pt with increased difficulty during transfers clearing bottom. Pt able to verbalize what she needs to do, but difficulty putting it into action and pt aware of this as well. Practiced multiple transfers from mat and then back to bed with min A for scooting and mod A for full squat. Discussed d/c planning with pt planning to d/c to her brother's house for a week or two and then transition to her apartment. Home entry for brother's home will need temporary ramp, but pt states he had built one for her in the past and it would not be an issue to reinstall.   Therapy Documentation Precautions:  Precautions Precautions: Fall Precaution Comments: new L AKA, old R BKA,  Restrictions Weight Bearing Restrictions: Yes LLE Weight Bearing: Non weight bearing  Pain:  Initially no complaints of pain, but increased during therapy session (8/10) and RN notified.  See FIM for current functional status  Therapy/Group: Individual Therapy  Karolee Stamps [facility] 04/04/2013, 10:45 AM

## 2013-04-04 NOTE — Patient Care Conference (Signed)
Inpatient RehabilitationTeam Conference and Plan of Care Update Date: 04/03/2013   Time: 2:05 PM    Patient Name: [name]      Medical Record Number: [id number]  Date of Birth: 04/18/1956 Sex: Female         Room/Bed: 4W25C/4W25C-01 Payor Info: Payor: MEDICARE / Plan: MEDICARE PART A AND B / Product Type: *No Product type* /    Admitting Diagnosis: R CVA  Admit Date/Time:  03/30/2013  3:33 PM Admission Comments: No comment available   Primary Diagnosis:  Unilateral AKA Principal Problem: Unilateral AKA  Patient Active Problem List   Diagnosis Date Noted  . Unilateral AKA--left 03/30/2013  . Wound dehiscence, surgical--left groin 03/30/2013  . Leucocytosis 03/30/2013  . Hx of right BKA 03/30/2013  . Coronary atherosclerosis of native coronary artery 03/28/2013  . Sepsis 03/28/2013  . Anemia of chronic renal failure 03/28/2013  . Secondary hyperparathyroidism 03/28/2013  . Acute osteomyelitis of foot 03/25/2013  . Atherosclerotic PVD with ulceration 02/20/2013  . CVA (cerebral infarction) 02/12/2013  . Cardiomyopathy, ischemic 02/03/2013  . DM (diabetes mellitus), secondary, w/peripheral vascular complications 02/03/2013  . Pain in limb 01/31/2013  . PVD (peripheral vascular disease) 10/04/2012  . MGUS (monoclonal gammopathy of unknown significance) 07/11/2012  . Infected sebaceous cyst of Right posterior neck. 09/30/2011  . HYPERLIPIDEMIA 07/28/2010  . OBSTRUCTIVE SLEEP APNEA 07/27/2010  . ABNORMAL HEART RHYTHMS 07/27/2010  . End stage renal disease 07/27/2010    Expected Discharge Date: Expected Discharge Date: 04/13/13  Team Members Present: Physician leading conference: Dr. Faith Rogue Nurse Present: Daryll Brod, RN PT Present: Karolee Stamps, PT OT Present: Mackie Pai, OT;Patricia Mat Carne, OT Other (Discipline and Name): Tora Duck, PPS Coordinator     Current Status/Progress Goal Weekly Team Focus  Medical   new left AKA, prior right BKA with  prosthesis  stabilize medically for dc/ to increase activity tolerance  pain mgt, wound care, tachycardia dx   Bowel/Bladder   Anuria-HD, M,W, F. Continent of bowel per report. LBM 03/30/13  Managed bowel  Regular bowel movement   Swallow/Nutrition/ Hydration             ADL's   Independent UB dressing, mod assist LB dressing, set-up bathing, set-up grooming  Overall mod I goals  fxal transfers, BADL's, IADL's, dynamic standing balance/endurance, UE strengthening, activity tolerance   Mobility   min A for squat pivot transfer, max-mod A for sit<>stand transfer using RW, S w/c propulsion, bed mobility min A-close S,   Mod I w/c level, mod I bed mobility, min A car transfer, S dynamic standing balance  Dynamic standing balance, functional transfers, bed mobility, w/c propulsion, LE/UE strengthening, activity tolerance/endurance   Communication             Safety/Cognition/ Behavioral Observations            Pain   Scheduled Oxy CR   <4  Offer pain medication 1 hr prior to initial therapy. Monitor for effectiveness   Skin   R BKA with prothesis. L AKA with staples and stump shrinker. Dehisced wound x 2 santyl ointment to area bid  No additional skin breakdown  Assess skin q shift. Continue with treatment to wounds    Rehab Goals Patient on target to meet rehab goals: Yes *See Care Plan and progress notes for long and short-term goals.  Barriers to Discharge: safety.    Possible Resolutions to Barriers:  supervision at home    Discharge Planning/Teaching Needs:  pt hopeful she can  return directly to her own apt which is handicapped accessible - can d/c to brother's home if some assistance needed.      Team Discussion:  Currently min- mod overall with w/c level activity.  Using prosthesis with transfers.  Some signs of slight confusion during sessions - monitor.  Bowel issues - encouraging use of laxatives.  Team recommends home with brother initially.  Revisions to Treatment Plan:   None   Continued Need for Acute Rehabilitation Level of Care: The patient requires daily medical management by a physician with specialized training in physical medicine and rehabilitation for the following conditions: Daily direction of a multidisciplinary physical rehabilitation program to ensure safe treatment while eliciting the highest outcome that is of practical value to the patient.: Yes Daily medical management of patient stability for increased activity during participation in an intensive rehabilitation regime.: Yes Daily analysis of laboratory values and/or radiology reports with any subsequent need for medication adjustment of medical intervention for : Post surgical problems;Neurological problems (renal)  Arrie Borrelli 04/04/2013, 10:27 AM

## 2013-04-04 NOTE — Evaluation (Signed)
Recreational Therapy Assessment and Plan  Patient Details  Name: [name] MRN: [id number] Date of Birth: 1956/01/22 Today's Date: 04/04/2013  Rehab Potential: Good ELOS: 10 days   Assessment Clinical Impression: Problem List:  Patient Active Problem List    Diagnosis  Date Noted   .  Unilateral AKA--left  03/30/2013   .  Wound dehiscence, surgical--left groin  03/30/2013   .  Leucocytosis  03/30/2013   .  Hx of right BKA  03/30/2013   .  Coronary atherosclerosis of native coronary artery  03/28/2013   .  Sepsis  03/28/2013   .  Anemia of chronic renal failure  03/28/2013   .  Secondary hyperparathyroidism  03/28/2013   .  Acute osteomyelitis of foot  03/25/2013   .  Atherosclerotic PVD with ulceration  02/20/2013   .  CVA (cerebral infarction)  02/12/2013   .  Cardiomyopathy, ischemic  02/03/2013   .  DM (diabetes mellitus), secondary, w/peripheral vascular complications  02/03/2013   .  Pain in limb  01/31/2013   .  PVD (peripheral vascular disease)  10/04/2012   .  MGUS (monoclonal gammopathy of unknown significance)  07/11/2012   .  Infected sebaceous cyst of Right posterior neck.  09/30/2011   .  HYPERLIPIDEMIA  07/28/2010   .  OBSTRUCTIVE SLEEP APNEA  07/27/2010   .  ABNORMAL HEART RHYTHMS  07/27/2010   .  End stage renal disease  07/27/2010    Past Medical History:  Past Medical History   Diagnosis  Date   .  Diabetes mellitus, type 2    .  Secondary hyperparathyroidism    .  OSA (obstructive sleep apnea)      NPSG 11.23.11-AHI 64.9/hr-central and obstructive apnea   .  Ischemic cardiomyopathy      EF 25-30% 2011   .  CAD (coronary artery disease)    .  Pulmonary hypertension      Est PAsyst 60-Dr Mendel Ryder, off coumadin, CT neg for PE   .  Hyperlipidemia    .  Anemia    .  Thyroid disease    .  Pulmonary edema    .  Peripheral vascular disease    .  Sebaceous cyst    .  Systemic lupus  08/08/07     Dr. Velvet Rocha   .  Irregular heart beat     .  Shortness of breath      with exertion   .  Hemodialysis patient      M,W,F   .  TIA (transient ischemic attack)  June 2014   .  Ulcer of heel due to diabetes      left   .  Kidney disease  Dialysis MWF     ESRD secondary to DM, failed transplant 2006. Dialysis    Past Surgical History:  Past Surgical History   Procedure  Laterality  Date   .  Coronary artery bypass graft   2011     3V   .  Insertion of dialysis catheter     .  Above knee leg amputation  Right      infection/gangrene 10/11/07   .  Nasal mucosal biopsy     .  Ovarian cyst surgery     .  Removal of failed cadaveric renal transplant 2006-wound infection     .  Laser retinal surgery   2011   .  Kidney transplant       rejection   .  Abdominal angiogram  Left  01/25/13   .  Colonoscopy     .  Cardiac catheterization     .  Eye surgery     .  Femoral-popliteal bypass graft  Left  02/20/2013     Procedure: FEMORAL-to below knee POPLITEAL ARTERY bypasswith saphenous vein with intraoperative angiogram ; Surgeon: Candace Hint, MD; Location: [facility] OR; Service: Vascular; Laterality: Left; fem-below knee pop   .  Amputation  Left  03/27/2013     Procedure: AMPUTATION ABOVE KNEE; Surgeon: Candace Hint, MD; Location: [facility] OR; Service: Vascular; Laterality: Left;    Assessment & Plan  Clinical Impression: [name] is a 57 y.o. female with history of SLE, DM type 2, ESRD--failed transplant, PVD-s/p R BKA with chronic ulcers left heel and and left thigh wound s/p L-FPBG. She's well known to rehab from prior stays past R-BKA and CABG and was independent with use of R-BK prosthesis PTA. She was admitted on 03/15/13 past Fall and reports of 1-2 week history of weakness. Left heel ulcer with necrotic material and foul odor and xrays done revealed osteomyelitis with bone avulsion. She was started on IV antibiotics and vascular felt that foot was not salvageable. On 07/15, patient underwent L-AKA with placement of VAC  by Dr. Edilia Rocha. Wound culture positive for Pseudomonas and antibiotics changed to gentamicin on 07/16 with recommendations for 2 weeks treatment. Blood cultures pending. Therapies initiated and patient deconditioned with impairments due to AKA. Therapy team recommending CIR. Patient transferred to CIR on 03/30/2013.  Pt presents with decreased activity tolerance, decreased functional mobility, decreased balance Limiting pt's independence with leisure/community pursuits.  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;Community - Agricultural consultant (Comment);Crafts - Other (Comment) (creating business cards & greeting cards for family & freind) Other Leisure Interests: Television;Computer Leisure Participation Style: With Family/Friends;Alone Awareness of Community Resources: Good-identify 3 post discharge leisure resources Psychosocial / Spiritual Does patient have pets?: Yes (cat) Social interaction - Mood/Behavior: Cooperative Firefighter Appropriate for Education?: Yes Recreational Therapy Orientation Orientation -Reviewed with patient: Available activity resources Strengths/Weaknesses Patient Strengths/Abilities: Willingness to participate;Active premorbidly Patient weaknesses: Physical limitations  Plan Rec Therapy Plan Is patient appropriate for Therapeutic Recreation?: Yes Rehab Potential: Good Treatment times per week: Mi 2 times per week >20 minutes Estimated Length of Stay: 10 days TR Treatment/Interventions: Adaptive equipment instruction;1:1 session;Balance/vestibular training;Functional mobility training;Community reintegration;Patient/family education;Therapeutic activities;Recreation/leisure participation;Therapeutic exercise;UE/LE Coordination activities;Wheelchair propulsion/positioning  Recommendations for other services: None  Discharge Criteria: Patient will be discharged from TR  if patient refuses treatment 3 consecutive times without medical reason.  If treatment goals not met, if there is a change in medical status, if patient makes no progress towards goals or if patient is discharged from hospital.  The above assessment, treatment plan, treatment alternatives and goals were discussed and mutually agreed upon: by patient  Myan Locatelli 04/04/2013, 4:22 PM

## 2013-04-04 NOTE — Progress Notes (Signed)
Notes reviewed and accurately reflect treatment sessions.   

## 2013-04-05 ENCOUNTER — Inpatient Hospital Stay (HOSPITAL_COMMUNITY): Payer: Medicare Other | Admitting: Occupational Therapy

## 2013-04-05 ENCOUNTER — Inpatient Hospital Stay (HOSPITAL_COMMUNITY): Payer: Medicare Other

## 2013-04-05 ENCOUNTER — Inpatient Hospital Stay (HOSPITAL_COMMUNITY): Payer: Medicare Other | Admitting: *Deleted

## 2013-04-05 LAB — GLUCOSE, CAPILLARY
Glucose-Capillary: 77 mg/dL (ref 70–99)
Glucose-Capillary: 103 mg/dL — ABNORMAL HIGH (ref 70–99)
Glucose-Capillary: 94 mg/dL (ref 70–99)

## 2013-04-05 NOTE — Plan of Care (Signed)
Problem: RH PAIN MANAGEMENT Goal: RH STG PAIN MANAGED AT OR BELOW PT'S PAIN GOAL Pt will rate pain/phantom limb pain less than 3 on scale of 0-10  Outcome: Not Progressing Pain greater than 3

## 2013-04-05 NOTE — Significant Event (Signed)
C/o of nausea with vomiting at beginning of shift. Refused Zofran, requested ginger ale and rest. Pt returned back to bed for rest since pt had 2 hrs of break between therapy. Upon arising, pt stated that nausea had resolved, but complained of chills. BG taken. WNL.  After therapy session, pt requested something for nausea. Zofran 4mg  po given. Noted that pt had not had a bowel movement since 7/19. Discussed with pt that this could possibly be the reason for the nausea and vomiting. Pt verbalized understanding. Agreeable to taking something to promote a bowel movement.

## 2013-04-05 NOTE — Progress Notes (Signed)
Notes reviewed and accurately reflect treatment sessions.   

## 2013-04-05 NOTE — Progress Notes (Signed)
Reviewed and in agreement with treatment provided.  

## 2013-04-05 NOTE — Progress Notes (Signed)
Occupational Therapy Session Notes  Patient Details  Name: [name] MRN: [id number] Date of Birth: 09-25-1955  Today's Date: 04/05/2013  Short Term Goals: Week 1:  OT Short Term Goal 1 (Week 1): Pt will be able to transfer on/off elevated toiet with supervision from w/c. OT Short Term Goal 2 (Week 1): Pt will be able to don shorts from bed level with set up only. OT Short Term Goal 3 (Week 1): Pt will be able to sit on tub bench with steady assist (for a simulated transfer).  Skilled Therapeutic Interventions/Progress Updates:  Session Note #1: Time: 0730-0825 (55 mins) Pt reported 6/10 pain in L residual limb. RN notified.  Upon entering room, pt supine in bed asleep. Pt->EOB to eat breakfast. Pt engaged in ADL retraining from this level. Pt used lat leans and rolling side<>side for peri care and pulling up pants. OT spoke with pt about pt's plans for showering once discharged to brother's home. Pt stated she plans to sponge bathe at sink mostly and shower occasionally. OT will focus on completion of BADL's in w/c at sink level. Pt stated she wanted to stay in bed to sleep since she did not sleep well last night. At end of tx session, pt seated EOB with RN present.  Session Note #2: Time: 1300-1345 (45 mins) Pt with no report of pain.  Upon entering room, pt seated in w/c. Pt stated she felt nauseated and wanted RN to check her temperature. OT alerted NT and temp/BP/blood sugar checked. All were normal. RN notified. OT pushed pt->rehab gym and transferred->mat (min assist). From here skilled intervention focused on BUE strengthening/endurance, education on HEP using T-band, fxal transfers using squat pivot technique, and overall activity tolerance. While completing ex's pt stated she felt dizzy (8/10) and OT instructed pt to take short rest break. OT pushed pt->room. At end of tx session, pt seated in w/c with call bell within reach and lunch in front.   Therapy  Documentation Precautions:  Precautions Precautions: Fall Precaution Comments: new L AKA, old R BKA,  Restrictions Weight Bearing Restrictions: Yes LLE Weight Bearing: Non weight bearing  See FIM for current functional status  Therapy/Group: Individual Therapy  [name] 04/05/2013, 7:30 AM

## 2013-04-05 NOTE — Progress Notes (Signed)
Physical Therapy Session Note  Patient Details  Name: [name] MRN: [id number] Date of Birth: 23-Mar-1956  Today's Date: 04/05/2013 Time: 1100-1200 Time Calculation (min): 60 min  Short Term Goals: Week 1:  PT Short Term Goal 1 (Week 1): STG=LTG, overall S for transfers and WC mobility mod I (  Skilled Therapeutic Interventions/Progress Updates:    Therapy session focused on functional transfers and therapeutic exercise. Pt performed A/P transfer from bed>w/c with min A and VC's for set up. Pt was able to perform w/c propulsion for UE strengthening and cardiovascular endurance with S for 150 ft x2 (non-consecutively). Pt performed sit <>stand transfer (x2) in parallel bars with max A, verbal, and visual cues for UE/LE placement. Once standing in parallel bars dynamic balance challenge(forward and lateral reaching) was complete with min A  and use of one UE for support. Pt with heavy B/L UE pull on bars instead of pushing straight down therefore pt was educated on increased safety risk of pulling instead of pushing straight down when using a RW to stand. Pt completed w/c>mat table squat pivot transfer with the mat at an elevated height to encourage increased WB through the R prosthetic limb and B/L UE. Pt requires VC's to get COM over BOS as she tends to push posteriorly when attempting to scoot or transfer. Pt performed 5 B/L scoots with focus on WB through R prosthetic limb with use of B/L UE for support to assist increased independence with functional squat pivot transfer. Pt completed 2 sets x10 reps of UE tricep extension exercise for UE strengthening to improve functional transfers. Pt was educated strategies for performing weight shifting every 15-20 mins when seated in w/c. Overall, pt continues to present with decreased activity tolerance and today had one episode of nausea and vomiting at start of therapy session, nursing staff was notified. No c/o pain during therapy session.    Therapy  Documentation Precautions:  Precautions Precautions: Fall Precaution Comments: new L AKA, old R BKA,  Restrictions Weight Bearing Restrictions: Yes LLE Weight Bearing: Non weight bearing  See FIM for current functional status  Therapy/Group: Individual Therapy  [name], Felesia Stahlecker 04/05/2013, 12:04 PM

## 2013-04-05 NOTE — Progress Notes (Signed)
Physical Therapy Session Note  Patient Details  Name: [name] MRN: [id number] Date of Birth: Sep 29, 1955  Today's Date: 04/05/2013 Time: 1400-1415 Time Calculation (min): 15 min  Short Term Goals: Week 1:  PT Short Term Goal 1 (Week 1): STG=LTG, overall S for transfers and WC mobility mod I (  Skilled Therapeutic Interventions/Progress Updates:    Therapy session focused on functional transfers and bed mobility. Pt performed squat pivot w/c>bed transfer with min A for balance with the R prosthetic limb for LE support and VC's for set up. Pt performed sit>supine with S and independently doffed prosthetic limb. Pt reports nausea and vomiting (multiple episodes) this afternoon and requested to "be done for the day" due to fatigue and illness. Nursing staff was notified and nausea medication requested. Pt missed 15 mins of skilled therapy, nursing staff notified.    Therapy Documentation Precautions:  Precautions Precautions: Fall Precaution Comments: new L AKA, old R BKA,  Restrictions Weight Bearing Restrictions: Yes LLE Weight Bearing: Non weight bearing  See FIM for current functional status  Therapy/Group: Individual Therapy  [name], Kreg Earhart 04/05/2013, 2:07 PM

## 2013-04-05 NOTE — Progress Notes (Signed)
Subjective/Complaints: Missed dinner as HD went from 4:30 to 9:30. No other complaints. Pain under control. Still working on transfers with therapy A 12 point review of systems has been performed and if not noted above is otherwise negative.   Objective: Vital Signs: Blood pressure 106/62, pulse 96, temperature 98.3 F (36.8 C), temperature source Oral, resp. rate 19, height 5\' 9"  (1.753 m), weight 84.6 kg (186 lb 8.2 oz), SpO2 98.00%. No results found.  Recent Labs  04/03/13 0135 04/04/13 0540  WBC 6.2 6.0  HGB 10.4* 10.2*  HCT 32.2* 31.7*  PLT 177 151    Recent Labs  04/03/13 0135  NA 135  K 3.1*  CL 97  GLUCOSE 94  BUN 8  CREATININE 2.90*  CALCIUM 7.9*   CBG (last 3)   Recent Labs  04/04/13 0757 04/04/13 1116 04/04/13 2207  GLUCAP 70 93 90    Wt Readings from Last 3 Encounters:  04/05/13 84.6 kg (186 lb 8.2 oz)  03/30/13 84 kg (185 lb 3 oz)  03/30/13 84 kg (185 lb 3 oz)    Physical Exam:  Nursing note and vitals reviewed.  Constitutional: She is oriented to person, place, and time. She appears well-developed and well-nourished.  HENT: oral mucosa pink and moist. Dentition borderline  Head: Normocephalic and atraumatic.  Eyes: Conjunctivae are normal. Pupils are equal, round, and reactive to light.  Neck: Normal range of motion.  Cardiovascular: HR 100-110, regular rhythm. +gallop Pulmonary/Chest: Effort normal and breath sounds normal. No respiratory distress. She has no wheezes.  Abdominal: Soft. Bowel sounds are normal. She exhibits no distension.  Musculoskeletal: She exhibits no tenderness.  R-BKA stump well healed.  L-AKA with staples/intact/only small area of serous drainage centrally. Left stump is appropriately tender.  Leg is less edematous. Proximal wound site clean and granulating Neurological: She is alert and oriented to person, place, and time.  Alert and oriented. No cognitive deficits. No gross sensory loss. UE's are 4+/5 prox to distal.  RLE is 4/5 at hip/knee. Can lift left hip agst gravity.  Skin: Skin is warm and dry.  Psychiatric: She has a normal mood and affect. Her speech is normal and behavior is normal. Judgment and thought content normal. Cognition and memory are normal.   Assessment/Plan: 1. 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 Physiatrist is providing close team supervision and 24 hour management of active medical problems listed below. Physiatrist and rehab team continue to assess barriers to discharge/monitor patient progress toward functional and medical goals.  Can be Inconsistent at times with safety awareness. Team feels that supervision at home is required at dc.   FIM: FIM - Bathing Bathing Steps Patient Completed: Chest;Right Arm;Left Arm;Abdomen;Front perineal area;Buttocks;Right upper leg;Left upper leg Bathing: 5: Set-up assist to: Obtain items  FIM - Upper Body Dressing/Undressing Upper body dressing/undressing steps patient completed: Thread/unthread right bra strap;Thread/unthread left bra strap;Thread/unthread right sleeve of pullover shirt/dresss;Thread/unthread left sleeve of pullover shirt/dress;Put head through opening of pull over shirt/dress;Pull shirt over trunk Upper body dressing/undressing: 7: Complete Independence: No helper FIM - Lower Body Dressing/Undressing Lower body dressing/undressing steps patient completed: Thread/unthread right underwear leg;Thread/unthread left underwear leg;Pull underwear up/down;Thread/unthread right pants leg;Thread/unthread left pants leg;Pull pants up/down Lower body dressing/undressing: 4: Min-Patient completed 75 plus % of tasks (pt did not fasten pants)  FIM - Toileting Toileting: 0: Activity did not occur  FIM - Toilet Transfers  Toilet Transfers Assistive Devices: Elevated toilet seat;Grab bars;Prosthesis Toilet  Transfers: 4-To toilet/BSC: Min A (steadying Pt. > 75%);3-From toilet/BSC: Mod A (lift or lower assist)  FIM - Press photographer Assistive Devices: Prosthesis;Arm rests Bed/Chair Transfer: 3: Chair or W/C > Bed: Mod A (lift or lower assist);5: Sit > Supine: Supervision (verbal cues/safety issues)  FIM - Locomotion: Wheelchair Distance: 150 Locomotion: Wheelchair: 5: Travels 150 ft or more: maneuvers on rugs and over door sills with supervision, cueing or coaxing FIM - Locomotion: Ambulation Locomotion: Ambulation: 0: Activity did not occur  Comprehension Comprehension Mode: Auditory Comprehension: 4-Understands basic 75 - 89% of the time/requires cueing 10 - 24% of the time  Expression Expression Mode: Verbal Expression: 5-Expresses basic 90% of the time/requires cueing < 10% of the time.  Social Interaction Social Interaction: 5-Interacts appropriately 90% of the time - Needs monitoring or encouragement for participation or interaction.  Problem Solving Problem Solving: 5-Solves basic 90% of the time/requires cueing < 10% of the time  Memory Memory: 4-Recognizes or recalls 75 - 89% of the time/requires cueing 10 - 24% of the time Medical Problem List and Plan:  1. DVT Prophylaxis/Anticoagulation: Pharmaceutical: Lovenox  2. Pain Management: Continues to have significant pain L-AKA. Will monitor and adjust. Premedicated prior to therapies.  3. Mood: seems to be dealing with situation appropriately. Provide ego support. Will have LCSW follow for evaluation/support.  4. Neuropsych: This patient is capable of making decisions on her own behalf.  5. ESRD: HD on M, W, F. Check daily weights with 1200 cc fluid restrictions.  6. DM type 2: Hypoglycemic episodes noted.off lantus and not on CM diet. Monitor BS with ac/hs cbg checks. 7. CAD with ICM: Asymptomatic. On midodrine due to hypotension. Resumed ASA. Did not use plavix at home.   -recent tachycardia---denies  symptoms   -EKG with ST, 94. Rate in 80's to 90's in general 8. Anemia of chronic disease: On aranesp weekly with routine monitoring of labs in dialysis.  9. OSA: Resume CPAP--uses face mask at home.  10. Pseudomonas wound infection: Gentamicin thorough 07/29 for two week antibiotic therapy?  -continue abx for now given leukocytosis  -dry dressing , stump sock to continue  LOS (Days) 6 A FACE TO FACE EVALUATION WAS PERFORMED  Iran Rowe T 04/05/2013 7:57 AM

## 2013-04-05 NOTE — Progress Notes (Signed)
Recreational Therapy Session Note  Patient Details  Name: [name] MRN: [id number] Date of Birth: November 06, 1955 Today's Date: 04/05/2013 Time:  1030 Pain: no c/o Skilled Therapeutic Interventions/Progress Updates: Pt with c/o fatigue from lack of sleep last night and requested to rest in bed.  Pt agreeable to get up for next scheduled therapy.  Pt missed 30 minutes session. Milo Solana 04/05/2013, 10:51 AM

## 2013-04-06 ENCOUNTER — Inpatient Hospital Stay (HOSPITAL_COMMUNITY): Payer: Medicare Other

## 2013-04-06 ENCOUNTER — Inpatient Hospital Stay (HOSPITAL_COMMUNITY): Payer: Medicare Other | Admitting: Occupational Therapy

## 2013-04-06 DIAGNOSIS — S78119A Complete traumatic amputation at level between unspecified hip and knee, initial encounter: Secondary | ICD-10-CM

## 2013-04-06 DIAGNOSIS — N186 End stage renal disease: Secondary | ICD-10-CM

## 2013-04-06 DIAGNOSIS — I739 Peripheral vascular disease, unspecified: Secondary | ICD-10-CM

## 2013-04-06 DIAGNOSIS — L98499 Non-pressure chronic ulcer of skin of other sites with unspecified severity: Secondary | ICD-10-CM

## 2013-04-06 LAB — CBC
HCT: 32 % — ABNORMAL LOW (ref 36.0–46.0)
Hemoglobin: 10.1 g/dL — ABNORMAL LOW (ref 12.0–15.0)
MCH: 27.2 pg (ref 26.0–34.0)
MCHC: 31.6 g/dL (ref 30.0–36.0)
MCV: 86 fL (ref 78.0–100.0)
Platelets: 172 10*3/uL (ref 150–400)
RBC: 3.72 MIL/uL — ABNORMAL LOW (ref 3.87–5.11)
RDW: 17.8 % — ABNORMAL HIGH (ref 11.5–15.5)
WBC: 6.2 10*3/uL (ref 4.0–10.5)

## 2013-04-06 LAB — RENAL FUNCTION PANEL
Albumin: 2.1 g/dL — ABNORMAL LOW (ref 3.5–5.2)
BUN: 14 mg/dL (ref 6–23)
CO2: 26 mEq/L (ref 19–32)
Calcium: 8.2 mg/dL — ABNORMAL LOW (ref 8.4–10.5)
Chloride: 95 mEq/L — ABNORMAL LOW (ref 96–112)
Creatinine, Ser: 5.15 mg/dL — ABNORMAL HIGH (ref 0.50–1.10)
GFR calc Af Amer: 10 mL/min — ABNORMAL LOW (ref >90)
GFR calc non Af Amer: 8 mL/min — ABNORMAL LOW (ref >90)
Glucose, Bld: 112 mg/dL — ABNORMAL HIGH (ref 70–99)
Phosphorus: 3.7 mg/dL (ref 2.3–4.6)
Potassium: 3.3 mEq/L — ABNORMAL LOW (ref 3.5–5.1)
Sodium: 133 mEq/L — ABNORMAL LOW (ref 135–145)

## 2013-04-06 LAB — GENTAMICIN LEVEL, RANDOM: Gentamicin Rm: 4.4 ug/mL

## 2013-04-06 LAB — GLUCOSE, CAPILLARY
Glucose-Capillary: 77 mg/dL (ref 70–99)
Glucose-Capillary: 96 mg/dL (ref 70–99)
Glucose-Capillary: 96 mg/dL (ref 70–99)

## 2013-04-06 MED ORDER — LIDOCAINE HCL (PF) 1 % IJ SOLN
5.0000 mL | INTRAMUSCULAR | Status: DC | PRN
  Filled 2013-04-06: qty 5

## 2013-04-06 MED ORDER — DOXERCALCIFEROL 4 MCG/2ML IV SOLN
1.0000 ug | INTRAVENOUS | Status: DC
  Administered 2013-04-06 – 2013-04-13 (×4): 1 ug via INTRAVENOUS
  Filled 2013-04-06 (×4): qty 2

## 2013-04-06 MED ORDER — SODIUM CHLORIDE 0.9 % IV SOLN: 100.0000 mL | INTRAVENOUS | Status: DC | PRN

## 2013-04-06 MED ORDER — HEPARIN SODIUM (PORCINE) 1000 UNIT/ML DIALYSIS
1000.0000 [IU] | INTRAMUSCULAR | Status: DC | PRN
  Filled 2013-04-06: qty 1

## 2013-04-06 MED ORDER — LIDOCAINE-PRILOCAINE 2.5-2.5 % EX CREA
1.0000 "application " | TOPICAL_CREAM | CUTANEOUS | Status: DC | PRN
  Filled 2013-04-06: qty 5

## 2013-04-06 MED ORDER — GENTAMICIN SULFATE 40 MG/ML IJ SOLN
140.0000 mg | INTRAVENOUS | Status: AC
  Administered 2013-04-06 – 2013-04-09 (×2): 140 mg via INTRAVENOUS
  Filled 2013-04-06 (×2): qty 3.5

## 2013-04-06 MED ORDER — BOOST PLUS PO LIQD
237.0000 mL | Freq: Three times a day (TID) | ORAL | Status: DC
  Administered 2013-04-07 – 2013-04-13 (×14): 237 mL via ORAL
  Filled 2013-04-06 (×32): qty 237

## 2013-04-06 MED ORDER — ALTEPLASE 2 MG IJ SOLR
2.0000 mg | Freq: Once | INTRAMUSCULAR | Status: AC | PRN
  Filled 2013-04-06: qty 2

## 2013-04-06 MED ORDER — PENTAFLUOROPROP-TETRAFLUOROETH EX AERO: 1.0000 "application " | INHALATION_SPRAY | CUTANEOUS | Status: DC | PRN

## 2013-04-06 MED ORDER — NEPRO/CARBSTEADY PO LIQD: 237.0000 mL | ORAL | Status: DC | PRN

## 2013-04-06 MED ORDER — DOCUSATE SODIUM 100 MG PO CAPS
100.0000 mg | ORAL_CAPSULE | Freq: Two times a day (BID) | ORAL | Status: DC
  Administered 2013-04-06: 100 mg via ORAL
  Filled 2013-04-06 (×4): qty 1

## 2013-04-06 MED ORDER — HEPARIN SODIUM (PORCINE) 1000 UNIT/ML DIALYSIS
20.0000 [IU]/kg | INTRAMUSCULAR | Status: DC | PRN
  Administered 2013-04-06: 1600 [IU] via INTRAVENOUS_CENTRAL
  Filled 2013-04-06: qty 2

## 2013-04-06 NOTE — Progress Notes (Signed)
ANTIBIOTIC CONSULT NOTE - FOLLOW UP  Pharmacy Consult for Gentamicin Indication: extensive osteomyelitis, now s/p L-AKA  Allergies  Allergen Reactions  . Neurontin (Gabapentin) Anaphylaxis  . Ezetimibe-Simvastatin Other (See Comments)    REACTION: face edema; itching  . Klonopin (Clonazepam) Other (See Comments)    hallucination  . Sertraline Hcl Other (See Comments)    hallucination    Patient Measurements: Weight: 84 kg  Labs:  Recent Labs  04/04/13 0540 04/06/13 1400  WBC 6.0 6.2  HGB 10.2* 10.1*  PLT 151 172  CREATININE  --  5.15*   Estimated Creatinine Clearance: 13.6 ml/min (by C-G formula based on Cr of 5.15).  Recent Labs  04/06/13 1430  GENTRANDOM 4.4     Microbiology: Recent Results (from the past 720 hour(s))  CULTURE, BLOOD (ROUTINE X 2)     Status: None   Collection Time    03/25/13  9:17 AM      Result Value Range Status   Specimen Description BLOOD LEFT ARM   Final   Special Requests BOTTLES DRAWN AEROBIC ONLY 10CC   Final   Culture  Setup Time 03/25/2013 20:46   Final   Culture NO GROWTH 5 DAYS   Final   Report Status 03/31/2013 FINAL   Final  CULTURE, BLOOD (ROUTINE X 2)     Status: None   Collection Time    03/25/13  9:22 AM      Result Value Range Status   Specimen Description BLOOD LEFT HAND   Final   Special Requests BOTTLES DRAWN AEROBIC ONLY 3CC   Final   Culture  Setup Time 03/25/2013 20:46   Final   Culture NO GROWTH 5 DAYS   Final   Report Status 03/31/2013 FINAL   Final  WOUND CULTURE     Status: None   Collection Time    03/26/13  1:39 PM      Result Value Range Status   Specimen Description WOUND LEFT FOOT   Final   Special Requests LEFT HEEL   Final   Gram Stain     Final   Value: NO WBC SEEN     NO SQUAMOUS EPITHELIAL CELLS SEEN     NO ORGANISMS SEEN   Culture ABUNDANT PSEUDOMONAS AERUGINOSA   Final   Report Status 03/30/2013 FINAL   Final   Organism ID, Bacteria PSEUDOMONAS AERUGINOSA   Final  SURGICAL PCR SCREEN      Status: None   Collection Time    03/27/13  1:25 AM      Result Value Range Status   MRSA, PCR NEGATIVE  NEGATIVE Final   Staphylococcus aureus NEGATIVE  NEGATIVE Final   Comment:            The Xpert SA Assay (FDA     approved for NASAL specimens     in patients over 78 years of age),     is one component of     a comprehensive surveillance     program.  Test performance has     been validated by [employer] for patients greater     than or equal to 69 year old.     It is not intended     to diagnose infection nor to     guide or monitor treatment.    Anti-infectives   Start     Dose/Rate Route Frequency Ordered Stop   04/06/13 2000  gentamicin (GARAMYCIN) 140 mg in dextrose 5 % 50  mL IVPB     140 mg 107 mL/hr over 30 Minutes Intravenous Every M-W-F (2000) 04/06/13 1619 04/10/13 2359   04/02/13 2000  gentamicin (GARAMYCIN) 160 mg in dextrose 5 % 50 mL IVPB  Status:  Discontinued     160 mg 108 mL/hr over 30 Minutes Intravenous Every M-W-F (2000) 03/31/13 1610 04/06/13 1619   04/02/13 1200  gentamicin (GARAMYCIN) 140 mg in dextrose 5 % 50 mL IVPB  Status:  Discontinued     140 mg 107 mL/hr over 30 Minutes Intravenous Every M-W-F (Hemodialysis) 03/30/13 1643 03/31/13 0939   03/30/13 1700  gentamicin (GARAMYCIN) 140 mg in dextrose 5 % 50 mL IVPB     140 mg 107 mL/hr over 30 Minutes Intravenous  Once 03/30/13 1631 03/30/13 2143      Assessment: 57 yof who continues on Gentamicin for morganella and PSA from 7/9 BCx (per MD note). Pt also with PSA wound infection/osteo this admission -- s/p L-AKA on 7/15. The patient is noted to be ESRD and receives HD on MWF. The patient was loaded with Gentamicin 200 mg on 7/16 and received Gentamicin 140 mg post HD on 7/18. Gentamicin pre-HD level = 3.2 on 7/19 and dose increased to 160mg  post HD.  Gentamicin pre-HD level today is supratherapeutic at 4.4. She tolerated today's HD session well (BFR 400, 4 hours) so will decrease dose back  down.  Goal of Therapy:  Pre-HD gentamicin level <4 mcg/ml with normal HD session  Plan:  1. Decrease Gentamicin to 140 mg IV post HD sessions on MWF 2. Probably will not need another level as gentamicin duration ends 7/29  Louie Casa 04/06/2013 4:19 PM

## 2013-04-06 NOTE — Progress Notes (Signed)
[facility] Progress Note  Subjective:   Thinks poor intake a combination of - lost upper dentures, poor appetite, diarrhea (noted to have had a laxative last night), food taste. Dislikes nepro. 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 Tsat 24% with Ferritin > 2000 (also had osteo)  Assessment/Plan: 1. 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 ESRD - MWF - HD today; lowering edw; last K 3.1 - 7/22 - recheck today; modify bath as needed; tight heparin 3. Anemia - Hgb stable - 10.2 7/23; Aranesp 100 q Wed 4. Secondary hyperparathyroidism - sensipar 60 - off hectorol due to elevated corrected Calcium - corrected Ca now down to 9.5 - use 2.25 Bath - if necessary can change to 2 Ca; will resume hectorol at 1 mcg - diet liberalized so if P increases, will need to resume renvela - last P 2.9 5. HTN/volume - on midodrine for BP support; titrating EDW down 6. Nutrition - poor Alb < 2; Rehab PA in room with pt during visit today; plan liberalize diet to regular - change to Boost - PA to make special request -  7. Left groin wound 8.  DM/hyperlipidema 9. Isch CM  Sheffield Slider, PA-C [facility] Beeper [phone] 04/06/2013,9:09 AM  LOS: 7 days   Patient seen and examined.  Agree with assessment and plan as above. Vinson Moselle  MD Pager [phone]    Cell  [phone] 04/06/2013, 1:33 PM     Additional Objective Labs: Basic Metabolic Panel:  Recent Labs Lab 04/03/13 0135  NA 135  K 3.1*  CL 97  CO2 31  GLUCOSE 94  BUN 8  CREATININE 2.90*  CALCIUM 7.9*  PHOS 2.9   Liver Function Tests:  Recent Labs Lab 04/03/13 0135  AST 25  ALT 13  ALKPHOS 99  BILITOT 0.7  PROT 7.1  ALBUMIN 1.9*  CBC:  Recent Labs Lab 04/03/13 0135 04/04/13 0540  WBC 6.2 6.0  HGB 10.4* 10.2*  HCT 32.2* 31.7*  MCV 85.4 85.7  PLT 177 151   BCBG:  Recent Labs Lab 04/05/13 1114 04/05/13 1316 04/05/13 1639 04/05/13 2221 04/06/13 0738  GLUCAP 74 95 103* 94 77  Medications:   . antiseptic oral rinse  15 mL Mouth Rinse BID  . aspirin  325 mg Oral Daily  . atorvastatin  10 mg Oral QAC breakfast  .  cinacalcet  60 mg Oral Q breakfast  . collagenase   Topical BID  . darbepoetin (ARANESP) injection - DIALYSIS  100 mcg Intravenous Q Wed-HD  . docusate sodium  100 mg Oral BID  . enoxaparin (LOVENOX) injection  30 mg Subcutaneous Q24H  . feeding supplement (NEPRO CARB STEADY)  237 mL Oral TID WC  . gentamicin  160 mg Intravenous Q M,W,F-2000  . hydrocerin   Topical BID  . insulin aspart  0-10 Units Subcutaneous TID WC  . midodrine  10 mg Oral 3 times weekly  . multivitamin  1 tablet Oral Q supper  . oxyCODONE  10 mg Oral BID WC  . pantoprazole  40 mg Oral Daily

## 2013-04-06 NOTE — Progress Notes (Signed)
Subjective/Complaints: Up a little last night with bm's after laxative A 12 point review of systems has been performed and if not noted above is otherwise negative.   Objective: Vital Signs: Blood pressure 119/77, pulse 94, temperature 98.5 F (36.9 C), temperature source Oral, resp. rate 18, height 5\' 9"  (1.753 m), weight 81.7 kg (180 lb 1.9 oz), SpO2 100.00%. No results found.  Recent Labs  04/04/13 0540  WBC 6.0  HGB 10.2*  HCT 31.7*  PLT 151   No results found for this basename: NA, K, CL, CO, GLUCOSE, BUN, CREATININE, CALCIUM,  in the last 72 hours CBG (last 3)   Recent Labs  04/05/13 1639 04/05/13 2221 04/06/13 0738  GLUCAP 103* 94 77    Wt Readings from Last 3 Encounters:  04/06/13 81.7 kg (180 lb 1.9 oz)  03/30/13 84 kg (185 lb 3 oz)  03/30/13 84 kg (185 lb 3 oz)    Physical Exam:  Nursing note and vitals reviewed.  Constitutional: She is oriented to person, place, and time. She appears well-developed and well-nourished.  HENT: oral mucosa pink and moist. Dentition borderline  Head: Normocephalic and atraumatic.  Eyes: Conjunctivae are normal. Pupils are equal, round, and reactive to light.  Neck: Normal range of motion.  Cardiovascular: HR 100-110, regular rhythm. +gallop Pulmonary/Chest: Effort normal and breath sounds normal. No respiratory distress. She has no wheezes.  Abdominal: Soft. Bowel sounds are normal. She exhibits no distension.  Musculoskeletal: She exhibits no tenderness.  R-BKA stump well healed.  L-AKA with staples/intact/only small area of serous drainage centrally. Left stump is appropriately tender.  Leg is less edematous. Proximal wound site clean and granulating Neurological: She is alert and oriented to person, place, and time.  Alert and oriented. No cognitive deficits. No gross sensory loss. UE's are 4+/5 prox to distal. RLE is 4/5 at hip/knee. Can lift left hip agst gravity.  Skin: Skin is warm and dry.  Psychiatric: She has a  normal mood and affect. Her speech is normal and behavior is normal. Judgment and thought content normal. Cognition and memory are normal.   Assessment/Plan: 1. 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 Physiatrist is providing close team supervision and 24 hour management of active medical problems listed below. Physiatrist and rehab team continue to assess barriers to discharge/monitor patient progress toward functional and medical goals.  Can be Inconsistent at times with safety awareness. Team feels that supervision at home is required at dc.   FIM: FIM - Bathing Bathing Steps Patient Completed: Chest;Right Arm;Left Arm;Abdomen;Right upper leg Bathing: 3: Mod-Patient completes 5-7 26f 10 parts or 50-74%  FIM - Upper Body Dressing/Undressing Upper body dressing/undressing steps patient completed: Thread/unthread right bra strap;Thread/unthread left bra strap;Thread/unthread right sleeve of pullover shirt/dresss;Thread/unthread left sleeve of pullover shirt/dress;Put head through opening of pull over shirt/dress;Pull shirt over trunk Upper body dressing/undressing: 7: Complete Independence: No helper FIM - Lower Body Dressing/Undressing Lower body dressing/undressing steps patient completed: Thread/unthread right pants leg;Thread/unthread left pants leg;Pull pants up/down Lower body dressing/undressing: 5: Supervision: Safety issues/verbal cues  FIM - Toileting Toileting: 0: Activity did not occur  FIM - Diplomatic Services operational officer Devices: Elevated toilet seat;Grab bars;Prosthesis Toilet Transfers: 0-Activity did not occur  FIM - Banker Devices: Bed rails;HOB elevated;Arm rests;Sliding board Bed/Chair Transfer: 5: Supine > Sit: Supervision (verbal cues/safety issues);5: Bed > Chair or W/C:  Supervision (verbal cues/safety issues)  FIM - Locomotion: Wheelchair Distance: 150 Locomotion: Wheelchair: 5: Travels 150 ft or more: maneuvers on rugs and over door sills with supervision, cueing or coaxing FIM - Locomotion: Ambulation Locomotion: Ambulation: 0: Activity did not occur  Comprehension Comprehension Mode: Auditory Comprehension: 5-Understands complex 90% of the time/Cues < 10% of the time  Expression Expression Mode: Verbal Expression: 5-Expresses complex 90% of the time/cues < 10% of the time  Social Interaction Social Interaction: 6-Interacts appropriately with others with medication or extra time (anti-anxiety, antidepressant).  Problem Solving Problem Solving: 5-Solves complex 90% of the time/cues < 10% of the time  Memory Memory: 5-Recognizes or recalls 90% of the time/requires cueing < 10% of the time Medical Problem List and Plan:  1. DVT Prophylaxis/Anticoagulation: Pharmaceutical: Lovenox  2. Pain Management: Continues to have significant pain L-AKA. Will monitor and adjust. Premedicated prior to therapies.  3. Mood: seems to be dealing with situation appropriately. Provide ego support. Will have LCSW follow for evaluation/support.  4. Neuropsych: This patient is capable of making decisions on her own behalf.  5. ESRD: HD on M, W, F. Check daily weights with 1200 cc fluid restrictions.  6. DM type 2: Hypoglycemic episodes noted.off lantus and not on CM diet. Monitor BS with ac/hs cbg checks. 7. CAD with ICM: Asymptomatic. On midodrine due to hypotension. Resumed ASA. Did not use plavix at home.   -recent tachycardia---resolved 8. Anemia of chronic disease: On aranesp weekly with routine monitoring of labs in dialysis.  9. OSA: Resume CPAP--uses face mask at home.  10. Pseudomonas wound infection: Gentamicin thorough 07/29 for two week antibiotic therapy?  -continue abx for now given leukocytosis  -dry dressing , stump sock to continue  LOS (Days) 7 A  FACE TO FACE EVALUATION WAS PERFORMED  Future Yeldell T 04/06/2013 8:17 AM

## 2013-04-06 NOTE — Progress Notes (Signed)
Occupational Therapy Session Notes  Patient Details  Name: [name] MRN: [id number] Date of Birth: [birth year]  Today's Date: 04/06/2013  Short Term Goals: Week 1:  OT Short Term Goal 1 (Week 1): Pt will be able to transfer on/off elevated toiet with supervision from w/c. OT Short Term Goal 2 (Week 1): Pt will be able to don shorts from bed level with set up only. OT Short Term Goal 3 (Week 1): Pt will be able to sit on tub bench with steady assist (for a simulated transfer).  Skilled Therapeutic Interventions/Progress Updates:  Session Note #1: Time: 4098-1191 (55 mins) Pt with no report of pain.  Upon entering room, pt supine in bed. RN notified for dressing change to L groin area. Pt->EOB. OT and pt and discussed transfer options for d/c home when wearing prosthetic vs not wearing prosthetic. OT discussed using A-P or slide board transfer when not wearing prosthesis for increased safety and pt agrees with this. Pt transferred->w/c using slide board (set-up assist). From here, pt propelled w/c around room to collect clothing for dressing task. Pt engaged in ADL retraining from w/c level at sink. Pt completed grooming tasks from this level. Pt unable to use lat leans in w/c for pulling up pants. OT educated pt on rolling w/c next to bed to do lat leans or getting back in bed in order to pull up pants. Pt transferred->bed using slide board and posterior transfer (supervision). OT donned L limb sleeve. Pt rolled side<>side to assist with this. At end of tx session, pt seated EOB eating breakfast. Call bell within reach. BA on.  Session Note #2: Time: 1135-1205 (30 mins) Pt with no report of pain.  Upon entering room, pt seated in w/c. Pt propelled w/c using BUE's->rehab gym. Skilled intervention focused on fxal transfers using various techniques (A-P and slide board). Pt required supervision for all transfers. Pt practiced these transfers with and without prosthesis. Pt requires max v.c's  to lean forward during slide board transfers for safety. Pt stated she feels more comfortable using the slide board transfer at this time. Pt propelled w/c->room. OT instructed pt to ask brother about DME at home for d/c planning. At end of tx session, pt seated in w/c with call bell and phone within reach.  Therapy Documentation Precautions:  Precautions Precautions: Fall Precaution Comments: new L AKA, old R BKA,  Restrictions Weight Bearing Restrictions: Yes LLE Weight Bearing: Non weight bearing  See FIM for current functional status  Therapy/Group: Individual Therapy  Nahal Wanless 04/06/2013, 7:22 AM

## 2013-04-06 NOTE — Progress Notes (Signed)
Notes reviewed and accurately reflect treatment sessions.   

## 2013-04-06 NOTE — Progress Notes (Signed)
Reviewed and in agreement with treatment provided.  

## 2013-04-06 NOTE — Progress Notes (Signed)
Physical Therapy Weekly Progress Note  Patient Details  Name: [name] MRN: [id number] Date of Birth: 04-21-1956  Today's Date: 04/06/2013 Time: 1031-1129 Time Calculation (min): 58 min  Therapy session focused on bed mobility and functional transfers. Pt perfomred bed mobility in pt room with min A and bed rails to perform supine>sit and VC's for UE placement. Pt performed A/P transfer from Bed>w/c with min A for weight shifting and VC's to maintain forward trunk when scooting. Pt completed w/c propulsion for UE strengthening and cardiovascular endurance with S for >150 ft. Pt performed bed mobility in simulated home environment with close S for supine<>sit and VC's for UE placement for leverage to come to sit. Pt performed w/c>bed squat pivot transfer with min A and bed>w/c sliding board transfer with close S. Pt expressed wanting to continue to use the sliding board transfer as she feel safest and most comfortable performing transfer with sliding board. Pt performed sliding board w/c>car transfer with S and VC's for setup and required min A for car>w/c sliding board transfer which is much improved compared to previous attempts. Attempted to come to stand x 3 using RW with pt from elevated mat surface however pt was unable to achieve terminal knee extension with the R prosthetic limb. Therefore seated long arc quads were performed 1 set x 10 reps for quad strengthening to assist with functional mobility and transfers. No c/o pain during therapy session however ACE wrap came off during bed mobility therefore nursing staff was notified at the end of session.   Week 1:  PT Short Term Goal 1 (Week 1): STG=LTG, overall S for transfers and WC mobility mod I ( Week 2:  PT Short Term Goal 1 (Week 2): =LTG  Patient is progressing towards long term goals. Pt is currently able to perform squat pivot transfers with min A and sliding board transfers with close S and VC's for set up. Pt requires S for w/c  propulsion and bed mobility. Short term goals not set due to estimated length of stay. Pt is progressing well with therapy however appears to be limited by recent illness, fatigue, generalized weakness and deconditioning.    Patient continues to demonstrate the following deficits: decreased strength, activity tolerance, endurance, dynamic sitting balance, functional transfers and therefore will continue to benefit from skilled PT intervention to enhance overall performance with activity tolerance, balance, postural control, ability to compensate for deficits and functional use of  right lower prosthesis.  See Patient's Care Plan for progression toward long term goals.  Patient progressing toward long term goals..  Continue plan of care.  Skilled Therapeutic Interventions/Progress Updates:  Ambulation/gait training;Balance/vestibular training;Community reintegration;Discharge planning;Disease management/prevention;DME/adaptive equipment instruction;Neuromuscular re-education;Functional mobility training;Pain management;Patient/family education;Psychosocial support;Skin care/wound management;Splinting/orthotics;Therapeutic Activities;Therapeutic Exercise;UE/LE Strength taining/ROM;Wheelchair propulsion/positioning;UE/LE Coordination activities   Therapy Documentation Precautions:  Precautions Precautions: Fall Precaution Comments: new L AKA, old R BKA,  Restrictions Weight Bearing Restrictions: Yes LLE Weight Bearing: Non weight bearing  See FIM for current functional status  Therapy/Group: Individual Therapy  [name], Adelaida Reindel 04/06/2013, 10:58 AM

## 2013-04-06 NOTE — Progress Notes (Signed)
Physical Therapy Session Note  Patient Details  Name: [name] MRN: [id number] Date of Birth: 09-23-1955  Today's Date: 04/06/2013 Time: 5621-3086 Time Calculation (min): 43 min  Short Term Goals: Week 1:  PT Short Term Goal 1 (Week 1): STG=LTG, overall S for transfers and WC mobility mod I (  Skilled Therapeutic Interventions/Progress Updates:    Therapy session focused on w/c propulsion, functional transfers, and therapeutic exercise. Pt the performed >150 ft of w/c propulsion with S for UE strengthening and cardiovascular endurance over smooth and carpeted surfaces. Pt completed w/c<> mat table sliding board transfer (min A for w/c>mat; close S mat>w/c) with VC's to remain leaning forward when using the sliding board and for proper placement of the sliding board. Pt completed mat table exercises for core and LE strengthening that consisted of L hip extension, B/L hip flexion, L hip ABD in S/L, glut squeezes, and bridging with bolster under L residual limb and R prosthetic limb (1 set x 10 reps) to assist with functional mobility and independence with transfers. Pt requested to return to bed at end of therapy session as she has dialysis this afternoon and reports feeling dizzy. Pt performed w/c>bed slide board transfer with close S and VC's for set up. Nursing staff notified of symptomatic orthostasis that occurred during therapy session (BP: 95/56 mmHg; 89/52 mmHg; HR: 96 BPM). No c/o pain during therapy session however pt states numbness in left residual limb therefore pt education on desensitization techniques.   Therapy Documentation Precautions:  Precautions Precautions: Fall Precaution Comments: new L AKA, old R BKA,  Restrictions Weight Bearing Restrictions: Yes LLE Weight Bearing: Non weight bearing  See FIM for current functional status  Therapy/Group: Individual Therapy  [name], Aisley Whan 04/06/2013, 1:19 PM

## 2013-04-07 ENCOUNTER — Inpatient Hospital Stay (HOSPITAL_COMMUNITY): Payer: Medicare Other | Admitting: Occupational Therapy

## 2013-04-07 ENCOUNTER — Inpatient Hospital Stay (HOSPITAL_COMMUNITY): Payer: Medicare Other | Admitting: Physical Therapy

## 2013-04-07 DIAGNOSIS — L98499 Non-pressure chronic ulcer of skin of other sites with unspecified severity: Secondary | ICD-10-CM

## 2013-04-07 DIAGNOSIS — I739 Peripheral vascular disease, unspecified: Secondary | ICD-10-CM

## 2013-04-07 DIAGNOSIS — N186 End stage renal disease: Secondary | ICD-10-CM

## 2013-04-07 DIAGNOSIS — S78119A Complete traumatic amputation at level between unspecified hip and knee, initial encounter: Secondary | ICD-10-CM

## 2013-04-07 LAB — GLUCOSE, CAPILLARY
Glucose-Capillary: 84 mg/dL (ref 70–99)
Glucose-Capillary: 122 mg/dL — ABNORMAL HIGH (ref 70–99)

## 2013-04-07 NOTE — Progress Notes (Signed)
Occupational Therapy Session Note  Patient Details  Name: ANGELLYNN KIMBERLIN MRN: [id number] Date of Birth: Oct 24, 1955  Today's Date: 04/07/2013 Time: 1330-1400 Time Calculation (min): 30 min  Short Term Goals: Week 1:  OT Short Term Goal 1 (Week 1): Pt will be able to transfer on/off elevated toiet with supervision from w/c. OT Short Term Goal 2 (Week 1): Pt will be able to don shorts from bed level with set up only. OT Short Term Goal 3 (Week 1): Pt will be able to sit on tub bench with steady assist (for a simulated transfer).  Skilled Therapeutic Interventions/Progress Updates:  Patient seated in w/c upon arrival and agreeable to practice transfer back to bed since she reports that she had be up in w/c for long time.  Reviewed, demonstrated and patient return demonstrated scoot transfer w/c>bed with min-mod assist, scoot laterally while seated EOB to practice forward weight shifts and BUE strengthening, BUE exercises with Theraband while supine in bed.  Therapy Documentation Precautions:  Precautions Precautions: Fall Precaution Comments: new L AKA, old R BKA,  Restrictions Weight Bearing Restrictions: Yes LLE Weight Bearing: Non weight bearing Pain: Denies pain except occasional phantom pain  Therapy/Group: Individual Therapy  Mounir Skipper 04/07/2013, 5:15 PM

## 2013-04-07 NOTE — Progress Notes (Signed)
Patient ID: [name], female   DOB: 1955-11-14, 57 y.o.   MRN: [id number] Subjective/Complaints: Some loose stools, request to be off Colace A 12 point review of systems has been performed and if not noted above is otherwise negative.   Objective: Vital Signs: Blood pressure 111/62, pulse 85, temperature 98.4 F (36.9 C), temperature source Oral, resp. rate 18, height 5\' 9"  (1.753 m), weight 79.1 kg (174 lb 6.1 oz), SpO2 100.00%. No results found.  Recent Labs  04/06/13 1400  WBC 6.2  HGB 10.1*  HCT 32.0*  PLT 172    Recent Labs  04/06/13 1400  NA 133*  K 3.3*  CL 95*  GLUCOSE 112*  BUN 14  CREATININE 5.15*  CALCIUM 8.2*   CBG (last 3)   Recent Labs  04/06/13 1904 04/06/13 2125 04/07/13 0736  GLUCAP 83 96 84    Wt Readings from Last 3 Encounters:  04/07/13 79.1 kg (174 lb 6.1 oz)  03/30/13 84 kg (185 lb 3 oz)  03/30/13 84 kg (185 lb 3 oz)    Physical Exam:  Nursing note and vitals reviewed.  Constitutional: She is oriented to person, place, and time. She appears well-developed and well-nourished.  HENT: oral mucosa pink and moist. Dentition borderline  Head: Normocephalic and atraumatic.  Eyes: Conjunctivae are normal. Pupils are equal, round, and reactive to light.  Neck: Normal range of motion.  Cardiovascular: HR 100-110, regular rhythm. +gallop Pulmonary/Chest: Effort normal and breath sounds normal. No respiratory distress. She has no wheezes.  Abdominal: Soft. Bowel sounds are normal. She exhibits no distension.  Musculoskeletal: She exhibits no tenderness.  R-BKA stump well healed.  L-AKA with staples/intact/only small area of serous drainage centrally. Left stump is appropriately tender.  Leg is less edematous.Neurological: She is alert and oriented to person, place, and time.  Alert and oriented. No cognitive deficits. No gross sensory loss. UE's are 4+/5 prox to distal. RLE is 4/5 at hip/knee. Can lift left hip agst gravity.  Skin: Skin is  warm and dry.  Psychiatric: She has a normal mood and affect. Her speech is normal and behavior is normal. Judgment and thought content normal. Cognition and memory are normal.   Assessment/Plan: 1. 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 Physiatrist is providing close team supervision and 24 hour management of active medical problems listed below. Physiatrist and rehab team continue to assess barriers to discharge/monitor patient progress toward functional and medical goals.   FIM: FIM - Bathing Bathing Steps Patient Completed: Chest;Right Arm;Left Arm;Abdomen;Right upper leg Bathing: 3: Mod-Patient completes 5-7 62f 10 parts or 50-74%  FIM - Upper Body Dressing/Undressing Upper body dressing/undressing steps patient completed: Thread/unthread right bra strap;Thread/unthread left bra strap;Thread/unthread right sleeve of pullover shirt/dresss;Thread/unthread left sleeve of pullover shirt/dress;Put head through opening of pull over shirt/dress;Pull shirt over trunk Upper body dressing/undressing: 7: Complete Independence: No helper FIM - Lower Body Dressing/Undressing Lower body dressing/undressing steps patient completed: Thread/unthread right pants leg;Thread/unthread left pants leg Lower body dressing/undressing: 3: Mod-Patient completed 50-74% of tasks  FIM - Toileting Toileting: 0: Activity did not occur  FIM - Diplomatic Services operational officer Devices: Elevated toilet seat;Grab bars;Prosthesis Toilet Transfers: 0-Activity did not occur  FIM - Banker Devices: Sliding board;Arm rests Bed/Chair Transfer: 4: Chair or W/C > Bed: Min A (steadying Pt. > 75%);5: Bed > Chair or W/C: Supervision (verbal  cues/safety issues);5: Sit > Supine: Supervision (verbal cues/safety issues);5: Supine > Sit: Supervision  (verbal cues/safety issues)  FIM - Locomotion: Wheelchair Distance: 150 Locomotion: Wheelchair: 5: Travels 150 ft or more: maneuvers on rugs and over door sills with supervision, cueing or coaxing FIM - Locomotion: Ambulation Locomotion: Ambulation: 0: Activity did not occur  Comprehension Comprehension Mode: Auditory Comprehension: 5-Understands complex 90% of the time/Cues < 10% of the time  Expression Expression Mode: Verbal Expression: 6-Expresses complex ideas: With extra time/assistive device  Social Interaction Social Interaction: 6-Interacts appropriately with others with medication or extra time (anti-anxiety, antidepressant).  Problem Solving Problem Solving: 5-Solves complex 90% of the time/cues < 10% of the time  Memory Memory: 5-Requires cues to use assistive device Medical Problem List and Plan:  1. DVT Prophylaxis/Anticoagulation: Pharmaceutical: Lovenox  2. Pain Management: Continues to have significant pain L-AKA. Will monitor and adjust. Premedicated prior to therapies.  3. Mood: seems to be dealing with situation appropriately. Provide ego support. Will have LCSW follow for evaluation/support.  4. Neuropsych: This patient is capable of making decisions on her own behalf.  5. ESRD: HD on M, W, F. Check daily weights with 1200 cc fluid restrictions.  6. DM type 2: Hypoglycemic episodes noted.off lantus and not on CM diet. Monitor BS with ac/hs cbg checks. 7. CAD with ICM: Asymptomatic. On midodrine due to hypotension. Resumed ASA. Did not use plavix at home.   -recent tachycardia---resolved 8. Anemia of chronic disease: On aranesp weekly with routine monitoring of labs in dialysis.  9. OSA: Resume CPAP--uses face mask at home.  10. Pseudomonas wound infection: Gentamicin thorough 07/29 for two week antibiotic therapy?  -continue abx for now given leukocytosis  -dry dressing , stump sock to continue  LOS (Days) 8 A FACE TO FACE EVALUATION WAS  PERFORMED  Erick Colace 04/07/2013 10:54 AM

## 2013-04-07 NOTE — Progress Notes (Signed)
Physical Therapy Session Note  Patient Details  Name: [name] MRN: [id number] Date of Birth: 1955/11/25  Today's Date: 04/07/2013 Time: 1130-1200 Time Calculation (min): 30 min  Short Term Goals: Week 1:  PT Short Term Goal 1 (Week 1): STG=LTG, overall S for transfers and WC mobility mod I (  Therapy Documentation Precautions:  Precautions Precautions: Fall Precaution Comments: new L AKA, old R BKA,  Restrictions Weight Bearing Restrictions: Yes LLE Weight Bearing: Non weight bearing Pain: denies pain  Therapeutic Activity:(15') Transfer training bed<->w/c multiple times with S/Mod-I using slide board. Parallel bars to work on shifting COG over BOS (R prosthesis) with attempts at lifting buttocks to clear w/c seat cushion.  Wheelchair management:(15') Independent with parts management (ie; locks, leg rest, swing away armrests) and for proper positioning for safe slide-board transfers.  Independent with propelling and maneuvering around obstacles.   Therapy/Group: Individual Therapy  Rex Kras 04/07/2013, 12:47 PM

## 2013-04-07 NOTE — Progress Notes (Signed)
[facility] Progress Note  Subjective:  No complaints, working on transfers and upper body strength  Objective Filed Vitals:   04/06/13 1830 04/06/13 1843 04/07/13 0549 04/07/13 0624  BP: 117/66  111/62   Pulse: 66 83 85   Temp:  98 F (36.7 C) 98.4 F (36.9 C)   TempSrc:  Oral Oral   Resp:  18 18   Height:      Weight:  79.8 kg (175 lb 14.8 oz) 79.1 kg (174 lb 6.1 oz) 79.1 kg (174 lb 6.1 oz)  SpO2:   100%    Physical Exam General: NAD Heart: RRR no murmur Lungs: no wheezes or rales Abdomen: soft NT obese Extremities: right BKA no edema; left AKA dressed, dressing L groin wound Dialysis Access: right upper AVF with button holes + bruit/thrill  Dialysis Orders: ([facility] MWF)  4hrs     Dry wt 90.5kg   Heparin 2800      R AVF      Profile 2  Hectorol      Epogen / Venofer- none  July Labs: Phos 3.6, PTH 198.5. 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 6 Hypotension- midodrine, new dry wt 79-80k 7 DM2 8 Isch CM  Vinson Moselle  MD Pager [phone]    Cell  [phone] 04/07/2013, 9:01 AM          Additional Objective Labs: Basic Metabolic Panel:  Recent Labs Lab 04/03/13 0135 04/06/13 1400  NA 135 133*  K 3.1* 3.3*  CL 97 95*  CO2 31 26  GLUCOSE 94 112*  BUN 8 14  CREATININE 2.90* 5.15*  CALCIUM 7.9* 8.2*  PHOS 2.9 3.7   Liver Function Tests:  Recent Labs Lab 04/03/13 0135 04/06/13 1400  AST 25  --   ALT 13  --   ALKPHOS 99  --   BILITOT 0.7  --   PROT 7.1  --   ALBUMIN 1.9* 2.1*  CBC:  Recent Labs Lab 04/03/13 0135 04/04/13 0540 04/06/13 1400  WBC 6.2 6.0 6.2  HGB 10.4* 10.2* 10.1*  HCT 32.2* 31.7* 32.0*  MCV 85.4 85.7 86.0  PLT 177 151 172   BCBG:  Recent Labs Lab 04/06/13 0738 04/06/13 1137 04/06/13 1904 04/06/13 2125 04/07/13 0736  GLUCAP 77 96 83  96 84  Medications:   . antiseptic oral rinse  15 mL Mouth Rinse BID  . aspirin  325 mg Oral Daily  . atorvastatin  10 mg Oral QAC breakfast  . cinacalcet  60 mg Oral Q breakfast  . collagenase   Topical BID  . darbepoetin (ARANESP) injection - DIALYSIS  100 mcg Intravenous Q Wed-HD  . docusate sodium  100 mg Oral BID  . doxercalciferol  1 mcg Intravenous Q M,W,F-HD  . enoxaparin (LOVENOX) injection  30 mg Subcutaneous Q24H  . gentamicin  140 mg Intravenous Q M,W,F-2000  . hydrocerin   Topical BID  . insulin aspart  0-10 Units Subcutaneous TID WC  . lactose free nutrition  237 mL Oral TID WC  . midodrine  10 mg Oral 3 times weekly  . multivitamin  1 tablet Oral Q supper  . oxyCODONE  10 mg Oral BID WC  . pantoprazole  40 mg Oral Daily

## 2013-04-08 ENCOUNTER — Inpatient Hospital Stay (HOSPITAL_COMMUNITY): Payer: Medicare Other | Admitting: Occupational Therapy

## 2013-04-08 LAB — GLUCOSE, CAPILLARY
Glucose-Capillary: 105 mg/dL — ABNORMAL HIGH (ref 70–99)
Glucose-Capillary: 130 mg/dL — ABNORMAL HIGH (ref 70–99)

## 2013-04-08 NOTE — Progress Notes (Signed)
Pt refused CPAP

## 2013-04-08 NOTE — Progress Notes (Signed)
Patient ID: [name], female   DOB: 23-Sep-1955, 57 y.o.   MRN: [id number] Subjective/Complaints: No new issues, pain control is good A 12 point review of systems has been performed and if not noted above is otherwise negative.   Objective: Vital Signs: Blood pressure 103/54, pulse 92, temperature 98.2 F (36.8 C), temperature source Oral, resp. rate 18, height 5\' 9"  (1.753 m), weight 82.2 kg (181 lb 3.5 oz), SpO2 100.00%. No results found.  Recent Labs  04/06/13 1400  WBC 6.2  HGB 10.1*  HCT 32.0*  PLT 172    Recent Labs  04/06/13 1400  NA 133*  K 3.3*  CL 95*  GLUCOSE 112*  BUN 14  CREATININE 5.15*  CALCIUM 8.2*   CBG (last 3)   Recent Labs  04/07/13 1613 04/07/13 2011 04/08/13 0732  GLUCAP 130* 122* 107*    Wt Readings from Last 3 Encounters:  04/08/13 82.2 kg (181 lb 3.5 oz)  03/30/13 84 kg (185 lb 3 oz)  03/30/13 84 kg (185 lb 3 oz)    Physical Exam:  Nursing note and vitals reviewed.  Constitutional: She is oriented to person, place, and time. She appears well-developed and well-nourished.  HENT: oral mucosa pink and moist. Dentition borderline  Head: Normocephalic and atraumatic.  Eyes: Conjunctivae are normal. Pupils are equal, round, and reactive to light.  Neck: Normal range of motion.  Cardiovascular: HR 100-110, regular rhythm. +gallop Pulmonary/Chest: Effort normal and breath sounds normal. No respiratory distress. She has no wheezes.  Abdominal: Soft. Bowel sounds are normal. She exhibits no distension.  Musculoskeletal: She exhibits no tenderness.  R-BKA stump well healed.  L-AKA with staples/intact/only small area of serous drainage centrally. Left stump is appropriately tender.  Leg is less edematous.Neurological: She is alert and oriented to person, place, and time.  Alert and oriented. No cognitive deficits. No gross sensory loss. UE's are 4+/5 prox to distal. RLE is 4/5 at hip/knee. Can lift left hip agst gravity.  Skin: Skin is  warm and dry.  Psychiatric: She has a normal mood and affect. Her speech is normal and behavior is normal. Judgment and thought content normal. Cognition and memory are normal.   Assessment/Plan: 1. 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 Physiatrist is providing close team supervision and 24 hour management of active medical problems listed below. Physiatrist and rehab team continue to assess barriers to discharge/monitor patient progress toward functional and medical goals.   FIM: FIM - Bathing Bathing Steps Patient Completed: Chest;Right Arm;Left Arm;Abdomen;Right upper leg;Front perineal area;Buttocks Bathing: 4: Min-Patient completes 8-9 30f 10 parts or 75+ percent (pt did not wash L upper leg d/t L limb sleeve)  FIM - Upper Body Dressing/Undressing Upper body dressing/undressing steps patient completed: Thread/unthread right sleeve of pullover shirt/dresss;Thread/unthread left sleeve of pullover shirt/dress;Put head through opening of pull over shirt/dress;Pull shirt over trunk Upper body dressing/undressing: 7: Complete Independence: No helper FIM - Lower Body Dressing/Undressing Lower body dressing/undressing steps patient completed: Thread/unthread right underwear leg;Thread/unthread left underwear leg;Pull pants up/down;Thread/unthread right pants leg;Pull underwear up/down;Thread/unthread left pants leg Lower body dressing/undressing: 5: Supervision: Safety issues/verbal cues  FIM - Toileting Toileting: 0: Activity did not occur  FIM - Diplomatic Services operational officer Devices: Human resources officer Transfers: 4-To toilet/BSC: Min A (steadying Pt. > 75%);5-From toilet/BSC: Supervision (verbal cues/safety issues) (steady assist->toilet)  FIM - Press photographer  Assistive Devices: Sliding board;Arm  rests Bed/Chair Transfer: 0: Activity did not occur  FIM - Locomotion: Wheelchair Distance: 150 Locomotion: Wheelchair: 5: Travels 150 ft or more: maneuvers on rugs and over door sills with supervision, cueing or coaxing FIM - Locomotion: Ambulation Locomotion: Ambulation: 0: Activity did not occur  Comprehension Comprehension Mode: Auditory Comprehension: 6-Follows complex conversation/direction: With extra time/assistive device  Expression Expression Mode: Verbal Expression: 7-Expresses complex ideas: With no assist  Social Interaction Social Interaction: 6-Interacts appropriately with others with medication or extra time (anti-anxiety, antidepressant).  Problem Solving Problem Solving: 5-Solves complex 90% of the time/cues < 10% of the time  Memory Memory: 5-Recognizes or recalls 90% of the time/requires cueing < 10% of the time Medical Problem List and Plan:  1. DVT Prophylaxis/Anticoagulation: Pharmaceutical: Lovenox  2. Pain Management: Continues to have significant pain L-AKA. Will monitor and adjust. Premedicated prior to therapies.  3. Mood: seems to be dealing with situation appropriately. Provide ego support. Will have LCSW follow for evaluation/support.  4. Neuropsych: This patient is capable of making decisions on her own behalf.  5. ESRD: HD on M, W, F. Check daily weights with 1200 cc fluid restrictions.  6. DM type 2: Hypoglycemic episodes noted.off lantus and not on CM diet. Monitor BS with ac/hs cbg checks. 7. CAD with ICM: Asymptomatic. On midodrine due to hypotension. Resumed ASA. Did not use plavix at home.   -recent tachycardia---resolved 8. Anemia of chronic disease: On aranesp weekly with routine monitoring of labs in dialysis.  9. OSA: Resume CPAP--uses face mask at home.  10. Pseudomonas wound infection: Gentamicin thorough 07/29 for two week antibiotic therapy?  -  Leukocytosis resolved  -dry dressing , stump sock to continue  LOS (Days) 9 A FACE TO  FACE EVALUATION WAS PERFORMED  Claudette Laws E 04/08/2013 9:44 AM

## 2013-04-08 NOTE — Progress Notes (Signed)
Occupational Therapy Session Note & Weekly Progress Note  Patient Details  Name: [name] MRN: [id number] Date of Birth: 12/29/1955  Today's Date: 04/08/2013  Session Note: Time: 0800-0845 (45 mins) Pt reported 7/10 pain in L residual limb. Pt stated she received pain meds this AM.  Upon entering room, pt seated in "chair" position in bed with breakfast. Pt stated she was unable to eat secondary to feeling nauseas. Pt->EOB and engaged in ADL retraining from this level. Pt used lat leans for peri care and rolling side<>side in bed for pulling up pants. Pt transferred<>drop arm BSC (steady assist->BSC; close supervision->bed). During tx session, pt asked about the possibility of discharging home with family present intermittently vs discharging to brother's home. OT instructed pt that therapies would further discuss this. At end of tx session, pt seated EOB eating breakfast. Call bell and phone within reach. BA on. ------------------------------------------------------------------------------------------------------------------------ Patient has made good progress on CIR. Patient voiced wish to discharge home vs home with brother during today's session. OT will continue to assess patient's fxal status and discharge needs to determine the most appropriate discharge plan. Patient has met 2 of 3 short term goals.  Pt did not meet STG #1: "Pt will be able to transfer on/off elevated toilet with supervision from w/c". Pt currently requires min-mod assist for squat pivot transfers. OT has been working on [employer] transfers as this is the most appropriate transfer technique at this time.  Patient continues to demonstrate the following deficits: decreased independence with fxal transfers, decreased independence with BADL's, decreased UE strength, decreased independence with IADL's, decreased activity tolerance and therefore will continue to benefit from skilled OT intervention to enhance overall  performance with BADL, iADL and Reduce care partner burden.  Patient progressing toward long term goals. Continue plan of care.  OT Short Term Goals Week 1:  OT Short Term Goal 1 (Week 1): Pt will be able to transfer on/off elevated toiet with supervision from w/c. OT Short Term Goal 1 - Progress (Week 1): Not met OT Short Term Goal 2 (Week 1): Pt will be able to don shorts from bed level with set up only. OT Short Term Goal 2 - Progress (Week 1): Met OT Short Term Goal 3 (Week 1): Pt will be able to sit on tub bench with steady assist (for a simulated transfer). OT Short Term Goal 3 - Progress (Week 1): Met Week 2:  OT Short Term Goal 1 (Week 2): STG=LTG secondary to ELOS  Skilled Therapeutic Interventions/Progress Updates:  Balance/vestibular training;Discharge planning;DME/adaptive equipment instruction;Functional mobility training;Pain management;Patient/family education;Self Care/advanced ADL retraining;Skin care/wound managment;Therapeutic Activities;Therapeutic Exercise;UE/LE Strength taining/ROM;Community reintegration;Disease mangement/prevention;Neuromuscular re-education;Psychosocial support;Wheelchair propulsion/positioning;UE/LE Coordination activities   Therapy Documentation Precautions:  Precautions Precautions: Fall Precaution Comments: new L AKA, old R BKA,  Restrictions Weight Bearing Restrictions: Yes LLE Weight Bearing: Non weight bearing  See FIM for current functional status  Therapy/Group: Individual Therapy  Theodor Mustin 04/08/2013, 9:14 AM

## 2013-04-08 NOTE — Progress Notes (Signed)
Weekly note and session note reviewed, both accurately reflect weekly progress and treatment session.   

## 2013-04-09 ENCOUNTER — Inpatient Hospital Stay (HOSPITAL_COMMUNITY): Payer: Medicare Other

## 2013-04-09 ENCOUNTER — Inpatient Hospital Stay (HOSPITAL_COMMUNITY): Payer: Medicare Other | Admitting: *Deleted

## 2013-04-09 ENCOUNTER — Inpatient Hospital Stay (HOSPITAL_COMMUNITY): Payer: Medicare Other | Admitting: Physical Therapy

## 2013-04-09 DIAGNOSIS — S78119A Complete traumatic amputation at level between unspecified hip and knee, initial encounter: Secondary | ICD-10-CM

## 2013-04-09 DIAGNOSIS — N186 End stage renal disease: Secondary | ICD-10-CM

## 2013-04-09 DIAGNOSIS — L98499 Non-pressure chronic ulcer of skin of other sites with unspecified severity: Secondary | ICD-10-CM

## 2013-04-09 DIAGNOSIS — I739 Peripheral vascular disease, unspecified: Secondary | ICD-10-CM

## 2013-04-09 LAB — CBC
HCT: 32.4 % — ABNORMAL LOW (ref 36.0–46.0)
Hemoglobin: 10.6 g/dL — ABNORMAL LOW (ref 12.0–15.0)
MCH: 27.8 pg (ref 26.0–34.0)
MCHC: 32.7 g/dL (ref 30.0–36.0)
MCV: 85 fL (ref 78.0–100.0)
RDW: 17.4 % — ABNORMAL HIGH (ref 11.5–15.5)

## 2013-04-09 LAB — RENAL FUNCTION PANEL
BUN: 21 mg/dL (ref 6–23)
CO2: 26 mEq/L (ref 19–32)
Calcium: 8.8 mg/dL (ref 8.4–10.5)
Creatinine, Ser: 5.52 mg/dL — ABNORMAL HIGH (ref 0.50–1.10)
Glucose, Bld: 119 mg/dL — ABNORMAL HIGH (ref 70–99)
Phosphorus: 3.3 mg/dL (ref 2.3–4.6)

## 2013-04-09 LAB — GLUCOSE, CAPILLARY: Glucose-Capillary: 103 mg/dL — ABNORMAL HIGH (ref 70–99)

## 2013-04-09 MED ORDER — LIDOCAINE HCL (PF) 1 % IJ SOLN: 5.0000 mL | INTRAMUSCULAR | Status: DC | PRN

## 2013-04-09 MED ORDER — HEPARIN SODIUM (PORCINE) 1000 UNIT/ML DIALYSIS
2800.0000 [IU] | Freq: Once | INTRAMUSCULAR | Status: DC
  Filled 2013-04-09: qty 3

## 2013-04-09 MED ORDER — NEPRO/CARBSTEADY PO LIQD
237.0000 mL | ORAL | Status: DC | PRN
Start: 2013-04-09 — End: 2013-04-09

## 2013-04-09 MED ORDER — SODIUM CHLORIDE 0.9 % IV SOLN: 100.0000 mL | INTRAVENOUS | Status: DC | PRN

## 2013-04-09 MED ORDER — HEPARIN SODIUM (PORCINE) 1000 UNIT/ML DIALYSIS
1000.0000 [IU] | INTRAMUSCULAR | Status: DC | PRN
  Filled 2013-04-09: qty 1

## 2013-04-09 MED ORDER — LIDOCAINE-PRILOCAINE 2.5-2.5 % EX CREA: 1.0000 "application " | TOPICAL_CREAM | CUTANEOUS | Status: DC | PRN

## 2013-04-09 MED ORDER — PENTAFLUOROPROP-TETRAFLUOROETH EX AERO: 1.0000 "application " | INHALATION_SPRAY | CUTANEOUS | Status: DC | PRN

## 2013-04-09 MED ORDER — ALTEPLASE 2 MG IJ SOLR
2.0000 mg | Freq: Once | INTRAMUSCULAR | Status: AC | PRN
  Filled 2013-04-09: qty 2

## 2013-04-09 NOTE — Progress Notes (Signed)
Reviewed and in agreement with treatment provided.  

## 2013-04-09 NOTE — Progress Notes (Signed)
Physical Therapy Session Note  Patient Details  Name: [name] MRN: [id number] Date of Birth: 07/20/1956  Today's Date: 04/09/2013 Time: 7829-5621 Time Calculation (min): 58 min  Short Term Goals: Week 2:  PT Short Term Goal 1 (Week 2): =LTG  Skilled Therapeutic Interventions/Progress Updates:    Therapy session focused on functional transfers, bed mobility, therapeutic exercise, and dynamic sitting balance. Pt performed sliding board transfers from w/c<>mat table with close S and VC's for set up. Pt performed A/P scooting (x4) with the use of B/L UE for support however activity was progressed to performing A/P scoots without use of UE to actively engage core and hip musculature with functional movement. Pt completed lateral scoot x 2 (length of end of mat table) with focus on WB through R prosthetic limb. Pt was educated on visually observing if her prosthetic foot is in contact with the floor to ensure WB in CKC as pt states she is unaware when prosthetic foot is in contact with the floor. Pt completed dynamic sitting balance challenges (forward throwing) without the use of UE or R prosthetic LE support with weighted ball (red ball) for UE strengthening and activation of abdominals and postural musculature. Pt performed short sliding board transfer from w/c<>Nustep with close S and VC's for set up.  Pt completed NuStep (resistance level 4 x 5 mins) for R LE, B/L strengthening and cardiovascular endurance. Discussed D/C plan with pt as she reports that her brother has not built a ramp yet and pt is requesting to return home with intermittent S from family and friends. No c/o pain during therapy session.    Therapy Documentation Precautions:  Precautions Precautions: Fall Precaution Comments: new L AKA, old R BKA,  Restrictions Weight Bearing Restrictions: Yes LLE Weight Bearing: Non weight bearing  See FIM for current functional status  Therapy/Group: Individual Therapy  [name],  Decklyn Hyder 04/09/2013, 1:33 PM

## 2013-04-09 NOTE — Progress Notes (Signed)
Physical Therapy Session Note  Patient Details  Name: [name] MRN: [id number] Date of Birth: August 03, 1956  Today's Date: 04/09/2013 Time: 1005-1030 Time Calculation (min): 25 min  Short Term Goals: Week 1:  PT Short Term Goal 1 (Week 1): STG=LTG, overall S for transfers and WC mobility mod I (  Skilled Therapeutic Interventions/Progress Updates:    Educated on risk of hip flexor contractures as secondary complication of AKA. Rt. sidelying hip extension in bed 2 x 10 reps, pt encouraged to practice this when in bed between therapies. Modified prone stretch for hip flexors (using pillows to produce stretch with pt rolled partially onto stomach as she could not tolerate full prone). Supine to sit min-guard assist and increased time pt needed railing. Sliding board transfer with pt directing wheelchair set up, pt able to place board with increased time. Pt donned prosthesis sitting EOB with supervision, good technique. Sliding board transfer with prosthesis, supervision. Wheelchair pushups 2 x 10 reps with 3 sec hold. Pt encouraged to perform pressure relief every 30 minutes, pt verbalized understanding.   Therapy Documentation Precautions:  Precautions Precautions: Fall Precaution Comments: new L AKA, old R BKA,  Restrictions Weight Bearing Restrictions: Yes LLE Weight Bearing: Non weight bearing Pain: Pain Assessment Faces Pain Scale: Hurts a little bit Pain Type: Acute pain Pain Location: Leg Pain Orientation: Left Pain Descriptors / Indicators: Aching Pain Onset: On-going Pain Intervention(s): Repositioned  See FIM for current functional status  Therapy/Group: Individual Therapy  Wilhemina Bonito 04/09/2013, 12:03 PM

## 2013-04-09 NOTE — Progress Notes (Signed)
Nursing Note: Assessed skin for IV site but unable to find site. No attempt. IV paged.wbb

## 2013-04-09 NOTE — Progress Notes (Signed)
Subjective:   No complaints, sitting in chair  Objective: Vital signs in last 24 hours: Temp:  [98.1 F (36.7 C)-98.3 F (36.8 C)] 98.1 F (36.7 C) (07/28 0508) Pulse Rate:  [90-91] 90 (07/28 0508) Resp:  [18-19] 18 (07/28 0508) BP: (119-120)/(69-76) 119/69 mmHg (07/28 0508) SpO2:  [94 %-98 %] 94 % (07/28 0508) Weight:  [82.8 kg (182 lb 8.7 oz)] 82.8 kg (182 lb 8.7 oz) (07/28 0508) Weight change: 0.6 kg (1 lb 5.2 oz)  Intake/Output from previous day: 07/27 0701 - 07/28 0700 In: 720 [P.O.:720] Out: -  Intake/Output this shift: Total I/O In: 240 [P.O.:240] Out: -  EXAM: General appearance:  Alert, in no apparent distress Resp:  CTA without rales, rhonchi, or wheezes Cardio:  RRR without murmur or rub GI:  + BS, soft and nontender Extremities: R BKA, L AKA with dressing Access:  AVF @ RUA with + bruit  Lab Results:  Recent Labs  04/06/13 1400  WBC 6.2  HGB 10.1*  HCT 32.0*  PLT 172   BMET:  Recent Labs  04/06/13 1400  NA 133*  K 3.3*  CL 95*  CO2 26  GLUCOSE 112*  BUN 14  CREATININE 5.15*  CALCIUM 8.2*  ALBUMIN 2.1*   No results found for this basename: PTH,  in the last 72 hours Iron Studies: No results found for this basename: IRON, TIBC, TRANSFERRIN, FERRITIN,  in the last 72 hours  Dialysis Orders: ([facility] MWF)  4hrs Dry wt 90.5kg Heparin 2800 R AVF Profile 2  Hectorol Epogen / Venofer- none  July Labs: Phos 3.6, PTH 198.5. Tsat 24% with Ferritin > 2000 (also had osteo)  Assessment/Plan: 1. 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 Edilia Bo 7/15; rehab since 7/18. 2. Sepsis/left groin wound - on Gentamicin through 7/29. 3. ESRD - HD on MWF @ [facility].  HD pending today. 4. HTN/Volume - BP 119/69 on Midodrine pre-HD; current wt 82.8, but previously down to 79.1.  Establish new EDW.  5. Anemia - Hgb 10.1 on Aranesp 100 mcg on Wed.  6. Secondary hyperparathyroidism - Ca 8.2 (9.7 corrected), P 3.7; Hectorol 1 mcg, Sensipar 60 mg qd,  currently no binders. 7. Nutrition - Alb 2.1, regular diet, vitamin. 8. DM - on insulin. 9. Ischemic CM    LOS: 10 days   LYLES,CHARLES 04/09/2013,1:09 PM  Patient seen and examined.  Agree with assessment and plan as above. Vinson Moselle  MD Pager [phone]    Cell  [phone] 04/09/2013, 3:48 PM

## 2013-04-09 NOTE — Progress Notes (Signed)
Occupational Therapy Note  Patient Details  Name: CHANIQUE DUCA MRN: [id number] Date of Birth: 1955-11-08 Today's Date: 04/09/2013  Time:  7829-5621  (45 min) Individual session Pain:  3/10  Left leg  Pt sitting in bed.  Agreed to get bathed and dressed EOB.  Performed ADL retraining from this level. Pt used laeral leans for peri care and rolling side to side in bed for washing and pulling up pants.  Pt. Reached into chest of drawers to retrieve clothes from EOB.  Left pt EOB with nursing changing dressings.   Humberto Seals 04/09/2013, 8:21 AM

## 2013-04-09 NOTE — Progress Notes (Signed)
Occupational Therapy Note  Patient Details  Name: [name] MRN: [id number] Date of Birth: 24-Jun-1956 Today's Date: 04/09/2013  Time: 1100-1155 Pt denies pain Individual Therapy  Pt engaged in BUE therex to increase independence with sliding board transfers and overall endurance.  Pt initially engaged in therex on UE ergometer (5 min at level 5 and 30 RPM and 5 min in reverse at level 5 and 30 PRM). Pt transitioned to shoulder exercises with 2.2 kg weighted ball, PNF with 1 kg weighted ball, and biceps curls with 4 lb dumb bell (3 sets of 8 with each exercise). Pt rolled back to room at end of session and remained in w/c with all needs within reach.   Lavone Neri [facility] 04/09/2013, 12:13 PM

## 2013-04-09 NOTE — Progress Notes (Signed)
Patient ID: [name], female   DOB: August 16, 1956, 57 y.o.   MRN: [id number] Subjective/Complaints: No problems this weekend. Pain improving.  A 12 point review of systems has been performed and if not noted above is otherwise negative.   Objective: Vital Signs: Blood pressure 119/69, pulse 90, temperature 98.1 F (36.7 C), temperature source Oral, resp. rate 18, height 5\' 9"  (1.753 m), weight 82.8 kg (182 lb 8.7 oz), SpO2 94.00%. No results found.  Recent Labs  04/06/13 1400  WBC 6.2  HGB 10.1*  HCT 32.0*  PLT 172    Recent Labs  04/06/13 1400  NA 133*  K 3.3*  CL 95*  GLUCOSE 112*  BUN 14  CREATININE 5.15*  CALCIUM 8.2*   CBG (last 3)   Recent Labs  04/08/13 1641 04/08/13 2025 04/09/13 0723  GLUCAP 130* 181* 103*    Wt Readings from Last 3 Encounters:  04/09/13 82.8 kg (182 lb 8.7 oz)  03/30/13 84 kg (185 lb 3 oz)  03/30/13 84 kg (185 lb 3 oz)    Physical Exam:  Nursing note and vitals reviewed.  Constitutional: She is oriented to person, place, and time. She appears well-developed and well-nourished.  HENT: oral mucosa pink and moist. Dentition borderline  Head: Normocephalic and atraumatic.  Eyes: Conjunctivae are normal. Pupils are equal, round, and reactive to light.  Neck: Normal range of motion.  Cardiovascular: HR 100-110, regular rhythm. +gallop Pulmonary/Chest: Effort normal and breath sounds normal. No respiratory distress. She has no wheezes.  Abdominal: Soft. Bowel sounds are normal. She exhibits no distension.  Musculoskeletal: She exhibits no tenderness.  R-BKA stump well healed.  L-AKA with staples/intact/only small area of serous drainage centrally. Left stump is appropriately tender.  Leg is less edematous.Neurological: She is alert and oriented to person, place, and time.  Alert and oriented. No cognitive deficits. No gross sensory loss. UE's are 4+/5 prox to distal. RLE is 4/5 at hip/knee. Can lift left hip agst gravity.  Skin: Skin  is warm and dry.  Psychiatric: She has a normal mood and affect. Her speech is normal and behavior is normal. Judgment and thought content normal. Cognition and memory are normal.   Assessment/Plan: 1. 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 Physiatrist is providing close team supervision and 24 hour management of active medical problems listed below. Physiatrist and rehab team continue to assess barriers to discharge/monitor patient progress toward functional and medical goals.   FIM: FIM - Bathing Bathing Steps Patient Completed: Chest;Right Arm;Left Arm;Abdomen;Right upper leg;Front perineal area;Buttocks Bathing: 4: Min-Patient completes 8-9 62f 10 parts or 75+ percent (pt did not wash L upper leg d/t L limb sleeve)  FIM - Upper Body Dressing/Undressing Upper body dressing/undressing steps patient completed: Thread/unthread right sleeve of pullover shirt/dresss;Thread/unthread left sleeve of pullover shirt/dress;Put head through opening of pull over shirt/dress;Pull shirt over trunk Upper body dressing/undressing: 7: Complete Independence: No helper FIM - Lower Body Dressing/Undressing Lower body dressing/undressing steps patient completed: Thread/unthread right underwear leg;Thread/unthread left underwear leg;Pull pants up/down;Thread/unthread right pants leg;Pull underwear up/down;Thread/unthread left pants leg Lower body dressing/undressing: 5: Supervision: Safety issues/verbal cues  FIM - Toileting Toileting: 0: Activity did not occur  FIM - Diplomatic Services operational officer Devices: Human resources officer Transfers: 4-To toilet/BSC: Min A (steadying Pt. > 75%);5-From toilet/BSC: Supervision (verbal cues/safety issues) (steady assist->toilet)  FIM - Press photographer  Assistive Devices: Sliding board;Arm  rests Bed/Chair Transfer: 0: Activity did not occur  FIM - Locomotion: Wheelchair Distance: 150 Locomotion: Wheelchair: 5: Travels 150 ft or more: maneuvers on rugs and over door sills with supervision, cueing or coaxing FIM - Locomotion: Ambulation Locomotion: Ambulation: 0: Activity did not occur  Comprehension Comprehension Mode: Auditory Comprehension: 6-Follows complex conversation/direction: With extra time/assistive device  Expression Expression Mode: Verbal Expression: 7-Expresses complex ideas: With no assist  Social Interaction Social Interaction: 7-Interacts appropriately with others - No medications needed.  Problem Solving Problem Solving: 5-Solves complex 90% of the time/cues < 10% of the time  Memory Memory: 6-More than reasonable amt of time Medical Problem List and Plan:  1. DVT Prophylaxis/Anticoagulation: Pharmaceutical: Lovenox  2. Pain Management: Continues to have significant pain L-AKA. Will monitor and adjust. Premedicated prior to therapies.  3. Mood: seems to be dealing with situation appropriately. Provide ego support. Will have LCSW follow for evaluation/support.  4. Neuropsych: This patient is capable of making decisions on her own behalf.  5. ESRD: HD on M, W, F. Check daily weights with 1200 cc fluid restrictions.  6. DM type 2: Hypoglycemic episodes noted.off lantus and not on CM diet. Monitor BS with ac/hs cbg checks. 7. CAD with ICM: Asymptomatic. On midodrine due to hypotension. Resumed ASA. Did not use plavix at home.   -recent tachycardia---resolved 8. Anemia of chronic disease: On aranesp weekly with routine monitoring of labs in dialysis.  9. OSA: Resume CPAP--uses face mask at home.  10. Pseudomonas wound infection: Gentamicin through 07/29 for two week antibiotic therapy?  -  Leukocytosis resolved  -dry dressing , stump sock to continue--wound looks good  LOS (Days) 10 A FACE TO FACE EVALUATION WAS PERFORMED  SWARTZ,ZACHARY  T 04/09/2013 8:13 AM

## 2013-04-10 ENCOUNTER — Encounter (HOSPITAL_COMMUNITY): Payer: Medicare Other | Admitting: *Deleted

## 2013-04-10 ENCOUNTER — Inpatient Hospital Stay (HOSPITAL_COMMUNITY): Payer: Medicare Other | Admitting: *Deleted

## 2013-04-10 ENCOUNTER — Inpatient Hospital Stay (HOSPITAL_COMMUNITY): Payer: Medicare Other | Admitting: Occupational Therapy

## 2013-04-10 ENCOUNTER — Inpatient Hospital Stay (HOSPITAL_COMMUNITY): Payer: Medicare Other

## 2013-04-10 ENCOUNTER — Inpatient Hospital Stay (HOSPITAL_COMMUNITY): Payer: Medicare Other | Admitting: Physical Therapy

## 2013-04-10 LAB — GLUCOSE, CAPILLARY
Glucose-Capillary: 71 mg/dL (ref 70–99)
Glucose-Capillary: 94 mg/dL (ref 70–99)

## 2013-04-10 NOTE — Progress Notes (Signed)
Nursing Note: Pt returned and no complaints.2057 checked vitals t-98.1 p-99 r-19 Po2 94 % on r/a.bp 132/76.No c/o pain.Pt refused meal.Snack provided and taken,cbg 71 prior to pt eating her snack.wbb

## 2013-04-10 NOTE — Progress Notes (Signed)
Physical Therapy Session Note  Patient Details  Name: [name] MRN: [id number] Date of Birth: 01/22/1956  Today's Date: 04/10/2013 Time: 1100-1230 Time Calculation (min): 30 min  Short Term Goals: Week 1:  PT Short Term Goal 1 (Week 1): STG=LTG, overall S for transfers and WC mobility mod I (  Therapy Documentation Precautions:  Precautions Precautions: Fall Precaution Comments: new L AKA, old R BKA,  Restrictions Weight Bearing Restrictions: Yes LLE Weight Bearing: Non weight bearing Pain: denies pain  Therapeutic Activity:(30') Patient sitting in w/c at bedside and noting some stomach upset and reports that she had slight episode of emesis this a.m. upon arising. Patient willing to participate with PT. Transfer trianing using short slideboard w/c<->bed 3x with close S/Mod-I using R prosthetic limb, however, on last transfer the right prosthesis was removed.  Transfer sit<->stand on right prosthesis with Mod-A and stood x 1 minute (repaeted 2x) with rest breaks. Patient again noting some nausea but this cleared with seated rest x 3 minutes.   Therapy/Group: Individual Therapy  Rex Kras 04/10/2013, 11:33 AM

## 2013-04-10 NOTE — Progress Notes (Signed)
Social Work Patient ID: [name], female   DOB: Dec 24, 1955, 57 y.o.   MRN: [id number]   Candace Jupiter, LCSW Social Worker Signed  Patient Care Conference Service date: 04/10/2013 2:35 PM  Inpatient RehabilitationTeam Conference and Plan of Care Update Date: 04/10/2013   Time: 2:05 PM     Patient Name: [name]       Medical Record Number: [id number]   Date of Birth: 02/08/1956 Sex: Female         Room/Bed: 4W25C/4W25C-01 Payor Info: Payor: MEDICARE / Plan: MEDICARE PART A AND B / Product Type: *No Product type* /   Admitting Diagnosis: R CVA   Admit Date/Time:  03/30/2013  3:33 PM Admission Comments: No comment available   Primary Diagnosis:  Unilateral AKA Principal Problem: Unilateral AKA    Patient Active Problem List     Diagnosis  Date Noted   .  Unilateral AKA--left  03/30/2013   .  Wound dehiscence, surgical--left groin  03/30/2013   .  Leucocytosis  03/30/2013   .  Hx of right BKA  03/30/2013   .  Coronary atherosclerosis of native coronary artery  03/28/2013   .  Sepsis  03/28/2013   .  Anemia of chronic renal failure  03/28/2013   .  Secondary hyperparathyroidism  03/28/2013   .  Acute osteomyelitis of foot  03/25/2013   .  Atherosclerotic PVD with ulceration  02/20/2013   .  CVA (cerebral infarction)  02/12/2013   .  Cardiomyopathy, ischemic  02/03/2013   .  DM (diabetes mellitus), secondary, w/peripheral vascular complications  02/03/2013   .  Pain in limb  01/31/2013   .  PVD (peripheral vascular disease)  10/04/2012   .  MGUS (monoclonal gammopathy of unknown significance)  07/11/2012   .  Infected sebaceous cyst of Right posterior neck.  09/30/2011   .  HYPERLIPIDEMIA  07/28/2010   .  OBSTRUCTIVE SLEEP APNEA  07/27/2010   .  ABNORMAL HEART RHYTHMS  07/27/2010   .  End stage renal disease  07/27/2010     Expected Discharge Date: Expected Discharge Date: 04/13/13  Team Members Present: Physician leading conference: Dr. Faith Rogue Social Worker  Present: Candace Jupiter, LCSW Nurse Present: Kennon Portela, RN PT Present: Karolee Stamps, PT OT Present: Edwin Cap, OT Other (Discipline and Name): Ottie Glazier, RN [facility])        Current Status/Progress  Goal  Weekly Team Focus   Medical     wounds improving. pain better. continuing to work on volume  see prior  see prior   Bowel/Bladder     Anuira-HD,M, W, F. Continent of bowel. LBM 04/09/13  Managed bowel  Monitor for regular bowel movement, and implement necessary intervention   Swallow/Nutrition/ Hydration            ADL's     independent UB dressing, supervision LB dressing, set-up bathing, close supervision fxal transfers  Overall supervision goals  d/c planning, fxal transfers, BADL's, IADL's, UE strengthening, overall activity tolerance   Mobility     Close S for sliding board transfer, min A-close S for bed mobility, S for w/c propulsion, S for dynamic sitting balance   Mod I w/c propulsion for short distances, bed mobility, sliding board transfers, min A car transfer, S community distances for w/c propulsion,  strengthening, w/c propulsion, sliding board transfers, bed mobility, activity tolerance/endurance.    Communication            Safety/Cognition/ Behavioral Observations  Pain     Scheduled Oxycodone 10mg  bid,   <4  Monitor for effectiveness of pain medication   Skin     R BKA with prothesis. L AKA with staples and stum p shrinker. Dehisced wound x 2 with santyl ointment to area bid  No additional skin breakdown  Assess skin q shift for appropriate healing    Rehab Goals Patient on target to meet rehab goals: Yes Rehab Goals Revised: upgraded to modified independent w/c level (except supervision car and shower transfers) *See Care Plan and progress notes for long and short-term goals.    Barriers to Discharge:  safety, consistency      Possible Resolutions to Barriers:    routine, transfer training with SB. mod I goals set now      Discharge  Planning/Teaching Needs:    Pt plans now to d/c to her own home with intermittent assist of family.      Team Discussion:    Making good gains this week and able to upgrade goals to mod i w/c level (supervision car and shower tfs).  Better control of pain.  Ready for d/c end of week.  Need new w/c.  No concerns.   Revisions to Treatment Plan:    Overall goals upgraded to mod i w/c.    Continued Need for Acute Rehabilitation Level of Care: The patient requires daily medical management by a physician with specialized training in physical medicine and rehabilitation for the following conditions: Daily direction of a multidisciplinary physical rehabilitation program to ensure safe treatment while eliciting the highest outcome that is of practical value to the patient.: Yes Daily medical management of patient stability for increased activity during participation in an intensive rehabilitation regime.: Yes Daily analysis of laboratory values and/or radiology reports with any subsequent need for medication adjustment of medical intervention for : Neurological problems;Post surgical problems (nephrology)  Alonza Bogus, Kiyah Demartini 04/10/2013, 2:35 PM         Candace Jupiter, LCSW Social Worker Signed  Patient Care Conference Service date: 04/04/2013 10:27 AM  Inpatient RehabilitationTeam Conference and Plan of Care Update Date: 04/03/2013   Time: 2:05 PM     Patient Name: [name]       Medical Record Number: [id number]   Date of Birth: [birth year] Sex: Female         Room/Bed: 4W25C/4W25C-01 Payor Info: Payor: MEDICARE / Plan: MEDICARE PART A AND B / Product Type: *No Product type* /   Admitting Diagnosis: R CVA   Admit Date/Time:  03/30/2013  3:33 PM Admission Comments: No comment available   Primary Diagnosis:  Unilateral AKA Principal Problem: Unilateral AKA    Patient Active Problem List     Diagnosis  Date Noted   .  Unilateral AKA--left  03/30/2013   .  Wound dehiscence, surgical--left groin   03/30/2013   .  Leucocytosis  03/30/2013   .  Hx of right BKA  03/30/2013   .  Coronary atherosclerosis of native coronary artery  03/28/2013   .  Sepsis  03/28/2013   .  Anemia of chronic renal failure  03/28/2013   .  Secondary hyperparathyroidism  03/28/2013   .  Acute osteomyelitis of foot  03/25/2013   .  Atherosclerotic PVD with ulceration  02/20/2013   .  CVA (cerebral infarction)  02/12/2013   .  Cardiomyopathy, ischemic  02/03/2013   .  DM (diabetes mellitus), secondary, w/peripheral vascular complications  02/03/2013   .  Pain in limb  01/31/2013   .  PVD (peripheral vascular disease)  10/04/2012   .  MGUS (monoclonal gammopathy of unknown significance)  07/11/2012   .  Infected sebaceous cyst of Right posterior neck.  09/30/2011   .  HYPERLIPIDEMIA  07/28/2010   .  OBSTRUCTIVE SLEEP APNEA  07/27/2010   .  ABNORMAL HEART RHYTHMS  07/27/2010   .  End stage renal disease  07/27/2010     Expected Discharge Date: Expected Discharge Date: 04/13/13  Team Members Present: Physician leading conference: Dr. Faith Rogue Nurse Present: Daryll Brod, RN PT Present: Karolee Stamps, PT OT Present: Mackie Pai, OT;Patricia Mat Carne, OT Other (Discipline and Name): Tora Duck, PPS Coordinator        Current Status/Progress  Goal  Weekly Team Focus   Medical     new left AKA, prior right BKA with prosthesis  stabilize medically for dc/ to increase activity tolerance  pain mgt, wound care, tachycardia dx   Bowel/Bladder     Anuria-HD, M,W, F. Continent of bowel per report. LBM 03/30/13  Managed bowel  Regular bowel movement   Swallow/Nutrition/ Hydration            ADL's     Independent UB dressing, mod assist LB dressing, set-up bathing, set-up grooming  Overall mod I goals  fxal transfers, BADL's, IADL's, dynamic standing balance/endurance, UE strengthening, activity tolerance   Mobility     min A for squat pivot transfer, max-mod A for sit<>stand transfer using RW, S w/c  propulsion, bed mobility min A-close S,   Mod I w/c level, mod I bed mobility, min A car transfer, S dynamic standing balance  Dynamic standing balance, functional transfers, bed mobility, w/c propulsion, LE/UE strengthening, activity tolerance/endurance   Communication            Safety/Cognition/ Behavioral Observations           Pain     Scheduled Oxy CR   <4  Offer pain medication 1 hr prior to initial therapy. Monitor for effectiveness   Skin     R BKA with prothesis. L AKA with staples and stump shrinker. Dehisced wound x 2 santyl ointment to area bid  No additional skin breakdown  Assess skin q shift. Continue with treatment to wounds    Rehab Goals Patient on target to meet rehab goals: Yes *See Care Plan and progress notes for long and short-term goals.    Barriers to Discharge:  safety.     Possible Resolutions to Barriers:    supervision at home      Discharge Planning/Teaching Needs:    pt hopeful she can return directly to her own apt which is handicapped accessible - can d/c to brother's home if some assistance needed.      Team Discussion:    Currently min- mod overall with w/c level activity.  Using prosthesis with transfers.  Some signs of slight confusion during sessions - monitor.  Bowel issues - encouraging use of laxatives.  Team recommends home with brother initially.   Revisions to Treatment Plan:    None    Continued Need for Acute Rehabilitation Level of Care: The patient requires daily medical management by a physician with specialized training in physical medicine and rehabilitation for the following conditions: Daily direction of a multidisciplinary physical rehabilitation program to ensure safe treatment while eliciting the highest outcome that is of practical value to the patient.: Yes Daily medical management of patient stability for increased activity during participation in an intensive  rehabilitation regime.: Yes Daily analysis of laboratory values  and/or radiology reports with any subsequent need for medication adjustment of medical intervention for : Post surgical problems;Neurological problems (renal)  Mavery Milling 04/04/2013, 10:27 AM

## 2013-04-10 NOTE — Progress Notes (Signed)
Recreational Therapy Session Note  Patient Details  Name: [name] MRN: [id number] Date of Birth: [birth year] Today's Date: 04/10/2013 Pain: no c/o  Pt missed 45 minute session due to fatigue.  Nile Dorning 04/10/2013, 4:13 PM

## 2013-04-10 NOTE — Progress Notes (Signed)
Occupational Therapy Session Note  Patient Details  Name: [name] MRN: [id number] Date of Birth: 1956/04/08  Today's Date: 04/10/2013  Short Term Goals: Week 1:  OT Short Term Goal 1 (Week 1): Pt will be able to transfer on/off elevated toiet with supervision from w/c. OT Short Term Goal 1 - Progress (Week 1): Not met OT Short Term Goal 2 (Week 1): Pt will be able to don shorts from bed level with set up only. OT Short Term Goal 2 - Progress (Week 1): Met OT Short Term Goal 3 (Week 1): Pt will be able to sit on tub bench with steady assist (for a simulated transfer). OT Short Term Goal 3 - Progress (Week 1): Met Week 2:  OT Short Term Goal 1 (Week 2): STG=LTG secondary to ELOS  Skilled Therapeutic Interventions/Progress Updates:  Session Note #1: Time: 0730-0825 (55 mins) Pt reported 4/10 pain in L residual limb. RN notified.  Upon entering room, pt supine in bed. Pt->EOB and refused to eat breakfast secondary to feeling nauseas. Pt transferred->w/c using slide board (close supervision) and required one v.c to lock w/c brake. Pt stated her w/c at home has one broken wheel. OT will notify PT and social worker of this in preparation for d/c home. Pt completed bathing task seated in w/c at sink. Pt unable to use lat leans in w/c for peri care. OT notified RN to change dressings on L limb. Pt transferred->bed using slide board (close supervision). RN changed dressings. Pt rolled side<>side and used lat leans for pulling up underwear/pants and donning of L limb sleeve. At end of tx session, pt seated EOB eating breakfast. Call bell and phone within reach. BA on.  Session Note #2: Time: 1300-1345 (45 mins) Pt reported 6/10 pain in L residual limb. RN notified.  Upon entering room, pt supine in bed asleep. Pt->EOB to eat lunch. Pt then transferred<>drop arm BSC using slide board (supervision). On BSC, pt practiced lat leans to increase independence with toileting task. Pt transferred  bed->w/c using slide board. Pt propelled w/c->gym and transferred<>therapy mat. On therapy mat skilled intervention focused on fxal transfers, BUE strengthening/endurance, and overall activity tolerance. Pt propelled w/c->room and transferred->bed using slide board. Pt required supervision for all transfers. At end of tx session, pt seated EOB eating lunch. Call bell and phone within reach.  Therapy Documentation Precautions:  Precautions Precautions: Fall Precaution Comments: new L AKA, old R BKA,  Restrictions Weight Bearing Restrictions: Yes LLE Weight Bearing: Non weight bearing  See FIM for current functional status  Therapy/Group: Individual Therapy  Tonnie Stillman 04/10/2013, 7:24 AM

## 2013-04-10 NOTE — Progress Notes (Signed)
Notes reviewed and accurately reflect treatment sessions.   

## 2013-04-10 NOTE — Progress Notes (Signed)
Reviewed and in agreement with treatment provided.  

## 2013-04-10 NOTE — Patient Care Conference (Signed)
Inpatient RehabilitationTeam Conference and Plan of Care Update Date: 04/10/2013   Time: 2:05 PM    Patient Name: [name]      Medical Record Number: [id number]  Date of Birth: 1956-05-17 Sex: Female         Room/Bed: 4W25C/4W25C-01 Payor Info: Payor: MEDICARE / Plan: MEDICARE PART A AND B / Product Type: *No Product type* /    Admitting Diagnosis: R CVA  Admit Date/Time:  03/30/2013  3:33 PM Admission Comments: No comment available   Primary Diagnosis:  Unilateral AKA Principal Problem: Unilateral AKA  Patient Active Problem List   Diagnosis Date Noted  . Unilateral AKA--left 03/30/2013  . Wound dehiscence, surgical--left groin 03/30/2013  . Leucocytosis 03/30/2013  . Hx of right BKA 03/30/2013  . Coronary atherosclerosis of native coronary artery 03/28/2013  . Sepsis 03/28/2013  . Anemia of chronic renal failure 03/28/2013  . Secondary hyperparathyroidism 03/28/2013  . Acute osteomyelitis of foot 03/25/2013  . Atherosclerotic PVD with ulceration 02/20/2013  . CVA (cerebral infarction) 02/12/2013  . Cardiomyopathy, ischemic 02/03/2013  . DM (diabetes mellitus), secondary, w/peripheral vascular complications 02/03/2013  . Pain in limb 01/31/2013  . PVD (peripheral vascular disease) 10/04/2012  . MGUS (monoclonal gammopathy of unknown significance) 07/11/2012  . Infected sebaceous cyst of Right posterior neck. 09/30/2011  . HYPERLIPIDEMIA 07/28/2010  . OBSTRUCTIVE SLEEP APNEA 07/27/2010  . ABNORMAL HEART RHYTHMS 07/27/2010  . End stage renal disease 07/27/2010    Expected Discharge Date: Expected Discharge Date: 04/13/13  Team Members Present: Physician leading conference: Dr. Faith Rogue Social Worker Present: [name] Jupiter, LCSW Nurse Present: Kennon Portela, RN PT Present: Karolee Stamps, PT OT Present: Edwin Cap, OT Other (Discipline and Name): Ottie Glazier, RN [facility])     Current Status/Progress Goal Weekly Team Focus  Medical   wounds improving. pain  better. continuing to work on volume  see prior  see prior   Bowel/Bladder   Anuira-HD,M, W, F. Continent of bowel. LBM 04/09/13  Managed bowel  Monitor for regular bowel movement, and implement necessary intervention   Swallow/Nutrition/ Hydration             ADL's   independent UB dressing, supervision LB dressing, set-up bathing, close supervision fxal transfers  Overall supervision goals  d/c planning, fxal transfers, BADL's, IADL's, UE strengthening, overall activity tolerance   Mobility   Close S for sliding board transfer, min A-close S for bed mobility, S for w/c propulsion, S for dynamic sitting balance   Mod I w/c propulsion for short distances, bed mobility, sliding board transfers, min A car transfer,  S community distances for w/c propulsion,  strengthening, w/c propulsion, sliding board transfers, bed mobility, activity tolerance/endurance.    Communication             Safety/Cognition/ Behavioral Observations            Pain   Scheduled Oxycodone 10mg  bid,   <4  Monitor for effectiveness of pain medication   Skin   R BKA with prothesis. L AKA with staples and stum p shrinker. Dehisced wound x 2 with santyl ointment to area bid  No additional skin breakdown  Assess skin q shift for appropriate healing    Rehab Goals Patient on target to meet rehab goals: Yes Rehab Goals Revised: upgraded to modified independent w/c level (except supervision car and shower transfers) *See Care Plan and progress notes for long and short-term goals.  Barriers to Discharge: safety, consistency    Possible Resolutions to Barriers:  routine, transfer training with SB. mod I goals set now    Discharge Planning/Teaching Needs:  Pt plans now to d/c to her own home with intermittent assist of family.      Team Discussion:  Making good gains this week and able to upgrade goals to mod i w/c level (supervision car and shower tfs).  Better control of pain.  Ready for d/c end of week.  Need new  w/c.  No concerns.  Revisions to Treatment Plan:  Overall goals upgraded to mod i w/c.   Continued Need for Acute Rehabilitation Level of Care: The patient requires daily medical management by a physician with specialized training in physical medicine and rehabilitation for the following conditions: Daily direction of a multidisciplinary physical rehabilitation program to ensure safe treatment while eliciting the highest outcome that is of practical value to the patient.: Yes Daily medical management of patient stability for increased activity during participation in an intensive rehabilitation regime.: Yes Daily analysis of laboratory values and/or radiology reports with any subsequent need for medication adjustment of medical intervention for : Neurological problems;Post surgical problems (nephrology)  Alonza Bogus, Ethleen Lormand 04/10/2013, 2:35 PM

## 2013-04-10 NOTE — Progress Notes (Signed)
Patient ID: [name], female   DOB: [birth year], 57 y.o.   MRN: [id number] Subjective/Complaints: No problems this weekend. Pain improving.  A 12 point review of systems has been performed and if not noted above is otherwise negative.   Objective: Vital Signs: Blood pressure 122/65, pulse 87, temperature 98.1 F (36.7 C), temperature source Oral, resp. rate 18, height 5\' 9"  (1.753 m), weight 80 kg (176 lb 5.9 oz), SpO2 99.00%. No results found.  Recent Labs  04/09/13 1611  WBC 4.9  HGB 10.6*  HCT 32.4*  PLT 199    Recent Labs  04/09/13 1611  NA 133*  K 3.8  CL 95*  GLUCOSE 119*  BUN 21  CREATININE 5.52*  CALCIUM 8.8   CBG (last 3)   Recent Labs  04/09/13 1106 04/09/13 2055 04/10/13 0723  GLUCAP 115* 71 94    Wt Readings from Last 3 Encounters:  04/10/13 80 kg (176 lb 5.9 oz)  03/30/13 84 kg (185 lb 3 oz)  03/30/13 84 kg (185 lb 3 oz)    Physical Exam:  Nursing note and vitals reviewed.  Constitutional: She is oriented to person, place, and time. She appears well-developed and well-nourished.  HENT: oral mucosa pink and moist. Dentition borderline  Head: Normocephalic and atraumatic.  Eyes: Conjunctivae are normal. Pupils are equal, round, and reactive to light.  Neck: Normal range of motion.  Cardiovascular: HR 100-110, regular rhythm. +gallop Pulmonary/Chest: Effort normal and breath sounds normal. No respiratory distress. She has no wheezes.  Abdominal: Soft. Bowel sounds are normal. She exhibits no distension.  Musculoskeletal: She exhibits no tenderness.  R-BKA stump well healed.  L-AKA with staples/intact/only small area of serous drainage centrally. Left stump is appropriately tender.  Leg is less edematous. Neurological: She is alert and oriented to person, place, and time.  Alert and oriented. No cognitive deficits. No gross sensory loss. UE's are 4+/5 prox to distal. RLE is 4/5 at hip/knee. Can lift left hip agst gravity.  Skin: Skin is warm  and dry.  Psychiatric: She has a normal mood and affect. Her speech is normal and behavior is normal. Judgment and thought content normal. Cognition and memory are normal.   Assessment/Plan: 1. 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 Physiatrist is providing close team supervision and 24 hour management of active medical problems listed below. Physiatrist and rehab team continue to assess barriers to discharge/monitor patient progress toward functional and medical goals.   FIM: FIM - Bathing Bathing Steps Patient Completed: Chest;Right Arm;Left Arm;Abdomen;Right upper leg;Front perineal area;Buttocks Bathing: 5: Set-up assist to: Obtain items  FIM - Upper Body Dressing/Undressing Upper body dressing/undressing steps patient completed: Thread/unthread right bra strap;Thread/unthread left bra strap;Thread/unthread right sleeve of pullover shirt/dresss;Thread/unthread left sleeve of pullover shirt/dress;Put head through opening of pull over shirt/dress;Pull shirt over trunk Upper body dressing/undressing: 7: Complete Independence: No helper FIM - Lower Body Dressing/Undressing Lower body dressing/undressing steps patient completed: Thread/unthread right underwear leg;Thread/unthread left underwear leg;Pull pants up/down;Thread/unthread right pants leg;Pull underwear up/down;Thread/unthread left pants leg Lower body dressing/undressing: 5: Set-up assist to: Don/Doff TED stocking  FIM - Toileting Toileting: 0: Activity did not occur  FIM - Diplomatic Services operational officer Devices: Human resources officer Transfers: 4-To toilet/BSC: Min A (steadying Pt. > 75%);5-From toilet/BSC: Supervision (verbal cues/safety issues) (steady assist->toilet)  FIM - Bed/Chair Transfer Bed/Chair Transfer Assistive Devices: Sliding  board;Prosthesis Bed/Chair  Transfer: 5: Chair or W/C > Bed: Supervision (verbal cues/safety issues);5: Sit > Supine: Supervision (verbal cues/safety issues)  FIM - Locomotion: Wheelchair Distance: 150 Locomotion: Wheelchair: 5: Travels 150 ft or more: maneuvers on rugs and over door sills with supervision, cueing or coaxing FIM - Locomotion: Ambulation Locomotion: Ambulation: 0: Activity did not occur  Comprehension Comprehension Mode: Asleep Comprehension: 6-Follows complex conversation/direction: With extra time/assistive device  Expression Expression Mode: Asleep Expression: 7-Expresses complex ideas: With no assist  Social Interaction Social Interaction Mode: Asleep Social Interaction: 7-Interacts appropriately with others - No medications needed.  Problem Solving Problem Solving Mode: Asleep Problem Solving: 5-Solves complex 90% of the time/cues < 10% of the time  Memory Memory Mode: Asleep Memory: 6-More than reasonable amt of time Medical Problem List and Plan:  1. DVT Prophylaxis/Anticoagulation: Pharmaceutical: Lovenox  2. Pain Management: stump pain improving. Will monitor and adjust. Premedicated prior to therapies.  3. Mood: seems to be dealing with situation appropriately. Provide ego support. Will have LCSW follow for evaluation/support.  4. Neuropsych: This patient is capable of making decisions on her own behalf.  5. ESRD: HD on M, W, F. Check daily weights with 1200 cc fluid restrictions.   6. DM type 2: Hypoglycemic episodes noted.off lantus and not on CM diet. Sugars controlled at present 7. CAD with ICM: Asymptomatic. On midodrine due to hypotension. Resumed ASA. Did not use plavix at home.   -recent tachycardia---resolved 8. Anemia of chronic disease: On aranesp weekly with routine monitoring of labs in dialysis.  9. OSA: Resume CPAP--uses face mask at home.  10. Pseudomonas wound infection: Gentamicin completed  -  Leukocytosis resolved  -dry dressing ,  stump sock to continue--wounds are healing nicely  LOS (Days) 11 A FACE TO FACE EVALUATION WAS PERFORMED  SWARTZ,ZACHARY T 04/10/2013 8:04 AM

## 2013-04-10 NOTE — Progress Notes (Signed)
Subjective: Feels fine, no complaints  Physical Exam:  Blood pressure 122/65, pulse 87, temperature 98.1 F (36.7 C), temperature source Oral, resp. rate 18, height 5\' 9"  (1.753 m), weight 80 kg (176 lb 5.9 oz), SpO2 99.00%. Gen: alert, no distress Resp: clear bilat Cardio:  RRR +2/6 SEM GI:  + BS, soft and nontender Extremities: R BKA, L AKA w clean wound Access:  AVF @ RUA with + bruit  Dialysis Orders ([facility] MWF)  4hrs    Dry wt 90.5kg     Heparin 2800    R AVF    Profile 2   Hectorol       Epogen / Venofer- none  July- PTH 198  Tsat 24% with Ferritin > 2000  Assessment/Recs 1. L AKA (Dr Edilia Bo 7/15)- rehab 2. Sepsis/left groin wound - finishes course of gent today 3. ESRD, cont hd MWF 4. HTN/Volume- euvolemic, new dry wt ~80kg, midodrine  5. Anemia- aranesp 100/wk 6. Sec HPT- cont sensipar/vit d, not on binders 7. DM2- on insulin 8. Ischemic CM    Candace Moselle  MD Pager [phone]    Cell  [phone] 04/10/2013, 7:50 AM   Recent Labs Lab 04/06/13 1400 04/09/13 1611  NA 133* 133*  K 3.3* 3.8  CL 95* 95*  CO2 26 26  GLUCOSE 112* 119*  BUN 14 21  CREATININE 5.15* 5.52*  CALCIUM 8.2* 8.8  PHOS 3.7 3.3    Recent Labs Lab 04/04/13 0540 04/06/13 1400 04/09/13 1611  WBC 6.0 6.2 4.9  HGB 10.2* 10.1* 10.6*  HCT 31.7* 32.0* 32.4*  MCV 85.7 86.0 85.0  PLT 151 172 199

## 2013-04-10 NOTE — Progress Notes (Signed)
Patient complained of nausea this AM, PRN zofran given. Gave regular ginger ale and crackers per patient's request. Scheduled AM meds changed to 0800 per patient's request. [name]

## 2013-04-10 NOTE — Progress Notes (Signed)
Physical Therapy Session Note  Patient Details  Name: [name] MRN: [id number] Date of Birth: [birth year]  Today's Date: 04/10/2013 Time: 0830-0930 Time Calculation (min): 60 min  Short Term Goals: Week 2:  PT Short Term Goal 1 (Week 2): =LTG  Skilled Therapeutic Interventions/Progress Updates:    Therapy session focused on functional transfers, w/c propulsion, and bed mobility. Pt completed w/c propulsion for >150 ft with S for UE strengthening and cardiovascular endurance. Pt performed car transfer with close S and minimal cues for set up. Pt was able to place and remove sliding board independently. Pt performed w/c<>bed sliding board transfer in the simulated home apartment with S and minimal VC's for set up. Pt was able to perform bed mobility (sit<>supine) with mod I as she requires increased time with transfer. Pt completed mat table exercise that consisted of bridging and L hip extension (1 set x 10 reps) for LE and core strengthening to assist with increased independence with functional mobility and transfers. Pt denies nausea or c/o pain during therapy session.     Therapy Documentation Precautions:  Precautions Precautions: Fall Precaution Comments: new L AKA, old R BKA,  Restrictions Weight Bearing Restrictions: Yes LLE Weight Bearing: Non weight bearing  See FIM for current functional status  Therapy/Group: Individual Therapy  [name], Tabias Swayze 04/10/2013, 8:25 AM

## 2013-04-10 NOTE — Progress Notes (Signed)
Nursing Note: Received report from [place] in HD . Pt tolerated tx well.2.5 L off.Pt slept thru most of treatment.No c/o pain.wbb

## 2013-04-11 ENCOUNTER — Inpatient Hospital Stay (HOSPITAL_COMMUNITY): Payer: Medicare Other

## 2013-04-11 ENCOUNTER — Inpatient Hospital Stay (HOSPITAL_COMMUNITY): Payer: Medicare Other | Admitting: *Deleted

## 2013-04-11 ENCOUNTER — Inpatient Hospital Stay (HOSPITAL_COMMUNITY): Payer: Medicare Other | Admitting: Occupational Therapy

## 2013-04-11 DIAGNOSIS — N186 End stage renal disease: Secondary | ICD-10-CM

## 2013-04-11 DIAGNOSIS — L98499 Non-pressure chronic ulcer of skin of other sites with unspecified severity: Secondary | ICD-10-CM

## 2013-04-11 DIAGNOSIS — S78119A Complete traumatic amputation at level between unspecified hip and knee, initial encounter: Secondary | ICD-10-CM

## 2013-04-11 DIAGNOSIS — I739 Peripheral vascular disease, unspecified: Secondary | ICD-10-CM

## 2013-04-11 LAB — CBC
HCT: 33.8 % — ABNORMAL LOW (ref 36.0–46.0)
Hemoglobin: 10.6 g/dL — ABNORMAL LOW (ref 12.0–15.0)
MCH: 27.5 pg (ref 26.0–34.0)
MCHC: 31.4 g/dL (ref 30.0–36.0)

## 2013-04-11 LAB — RENAL FUNCTION PANEL
GFR calc Af Amer: 11 mL/min — ABNORMAL LOW (ref >90)
GFR calc non Af Amer: 9 mL/min — ABNORMAL LOW (ref >90)
Glucose, Bld: 174 mg/dL — ABNORMAL HIGH (ref 70–99)
Phosphorus: 2.6 mg/dL (ref 2.3–4.6)
Potassium: 4.5 mEq/L (ref 3.5–5.1)
Sodium: 128 mEq/L — ABNORMAL LOW (ref 135–145)

## 2013-04-11 LAB — GLUCOSE, CAPILLARY
Glucose-Capillary: 95 mg/dL (ref 70–99)
Glucose-Capillary: 71 mg/dL (ref 70–99)

## 2013-04-11 NOTE — Progress Notes (Signed)
Occupational Therapy Session Notes & Discharge Summary  Patient Details  Name: [name] MRN: [id number] Date of Birth: 1956-06-18  Today's Date: 04/12/2013  Session Note #1: Time: 0800-0855 (55 mins) Pt reported 1/10 pain in L residual limb. RN made aware.  Upon entering room, pt seated in bed with RN administering meds. Pt->EOB then transferred->w/c using slide board (mod I). Pt propelled w/c around room to collect clothing/toiletries for BADL session. Pt completed bathing/grooming tasks in w/c at sink. Pt used forward/backward leans for peri care in w/c. Pt completed UB dressing independently. Pt donned pants seated in w/c then transferred->bed using slide board. From here, pt rolled side<>side to pull up underwear/pants. Pt able to recall using pillow case for increased ease with slide board transfers when pt does not have pants on. Pt takes more than a reasonable amount of time to problem solve how to complete tasks. At end of tx session, pt seated EOB with call bell within reach. BA on.  Session Note #2: Co-treatment with RT. Time: 1300-1345 (45 mins) Pt with no report of pain.  Upon entering room, pt seated in w/c. Pt propelled w/c->ADL apt. From here skilled intervention focused on completion of simple meal prep activity, dynamic sitting balance, overall activity tolerance, and education on home safety specifically in the kitchen. Pt propelled w/c->room. At end of tx session, pt seated in w/c with call bell and phone within reach. Pt was made Mod I in room today. --------------------------------------------------------------------------------------------------------------------------------------------------------------------- Patient has made steady progress on CIR. No family has been present for tx sessions or caregiver education. OT is recommending to patient that she wait to bathe in shower with home health OT. OT has also educated patient that she will need assistance with  laundry task until dynamic standing balance improves with home health OT. At discharge pt will use slide board for all transfers. Patient has met 6 of 6 long term goals due to improved activity tolerance, improved balance, ability to compensate for deficits, improved awareness and improved coordination.  Patient to discharge at overall Modified Independent level.    Reasons goals not met: N/A at this time.  Recommendation:  Patient will benefit from ongoing skilled OT services in home health setting to continue to advance functional skills in the area of BADL and iADL.  Equipment: drop arm BSC  Reasons for discharge: treatment goals met and discharge from hospital  Patient/family agrees with progress made and goals achieved: Yes  OT Discharge Precautions/Restrictions  Precautions Precautions: Fall Precaution Comments: new L AKA, old R BKA Restrictions Weight Bearing Restrictions: Yes LLE Weight Bearing: Non weight bearing  Pain Pain Assessment Pain Assessment: 0-10 Pain Score: 1  Pain Type: Surgical pain Pain Location: Leg Pain Orientation: Left Pain Descriptors / Indicators: Numbness Pain Intervention(s): RN made aware  ADL: See FIM  Vision/Perception  Vision - History Baseline Vision: Wears glasses only for reading Patient Visual Report: No change from baseline Vision - Assessment Eye Alignment: Within Functional Limits Perception Perception: Within Functional Limits Praxis Praxis: Intact   Cognition Overall Cognitive Status: Within Functional Limits for tasks assessed Arousal/Alertness: Awake/alert Orientation Level: Oriented X4  Sensation Sensation Light Touch: Impaired by gross assessment (pt reports numbness in bilateral hands) Stereognosis: Appears Intact Hot/Cold: Appears Intact Additional Comments: Phantom pain in LLE Coordination Gross Motor Movements are Fluid and Coordinated: Yes Fine Motor Movements are Fluid and Coordinated: Yes  Motor: See  D/C navigator  Mobility: See D/C navigator  Trunk/Postural Assessment: See D/C navigator   Balance:  See D/C navigator  Extremity/Trunk Assessment RUE Assessment RUE Assessment: Within Functional Limits LUE Assessment LUE Assessment: Within Functional Limits  See FIM for current functional status  Mckensie Scotti 04/12/2013, 8:10 AM

## 2013-04-11 NOTE — Progress Notes (Signed)
Physical Therapy Session Note  Patient Details  Name: [name] MRN: [id number] Date of Birth: Jun 15, 1956  Today's Date: 04/11/2013 Session #1     Session #2 Time: 7829-5621     Time: 1030-1058 Time Calculation (min): 60 min  Time calculation (min): 28 mins  Short Term Goals: Week 2:  PT Short Term Goal 1 (Week 2): =LTG  Skilled Therapeutic Interventions/Progress Updates:    Session #1: Therapy session focused on w/c propulsion, dynamic sitting balance, and therapeutic exercise. Pt performed w/c propulsion for >200 ft in the community setting with S that consisted of propelling up/down long distance ramp and over concrete and brick surfaces. Pt completed dynamic sitting balance challenges that consisted of high and lateral reaching exercises with close S without the use of UE support. Pt completed long arc quads and hip flexion in sitting with R prosthetic limb and L hip abd (S/L) (1 set x10 reps) for strengthening of LE to improve functional mobility. Pt performed w/c<> mat table sliding board transfer with mod I (w/c>bed)- close S (bed>w/c) as she required VC's for placement of sliding board. No c/o pain during therapy session however pt presents with decreased endurance and required seated rest breaks when propelling within the community setting.   Session #2: Therapy session focused on bed mobility and car transfers. Pt performed car transfer with close S and minimal VC's for setup. Pt reports increased confidence with getting in/out of car using sliding board. Pt requested to return to bed at end of therapy session requiring S for w/c>bed sliding board transfer and mod I for sit to supine. No c/o pain during therapy session however pt continues to report fatigue with activity.   Therapy Documentation Precautions:  Precautions Precautions: Fall Precaution Comments: new L AKA, old R BKA,  Restrictions Weight Bearing Restrictions: Yes LLE Weight Bearing: Non weight bearing  See FIM  for current functional status  Therapy/Group: Individual Therapy  [name], Darcey Cardy 04/11/2013, 8:31 AM

## 2013-04-11 NOTE — Progress Notes (Signed)
Reviewed and in agreement with treatment provided.  

## 2013-04-11 NOTE — Progress Notes (Signed)
Orthopedic Tech Progress Note Patient Details:  [name] 03-Jan-1956 [id number] Advanced was called for brace. Patient ID: [name], female   DOB: [birth year], 57 y.o.   MRN: [id number]   [name] 04/11/2013, 7:21 PM

## 2013-04-11 NOTE — Progress Notes (Signed)
Occupational Therapy Session Notes  Patient Details  Name: [name] MRN: [id number] Date of Birth: 01/14/1956  Today's Date: 04/11/2013  Short Term Goals: Week 1:  OT Short Term Goal 1 (Week 1): Pt will be able to transfer on/off elevated toiet with supervision from w/c. OT Short Term Goal 1 - Progress (Week 1): Not met OT Short Term Goal 2 (Week 1): Pt will be able to don shorts from bed level with set up only. OT Short Term Goal 2 - Progress (Week 1): Met OT Short Term Goal 3 (Week 1): Pt will be able to sit on tub bench with steady assist (for a simulated transfer). OT Short Term Goal 3 - Progress (Week 1): Met Week 2:  OT Short Term Goal 1 (Week 2): STG=LTG secondary to ELOS  Skilled Therapeutic Interventions/Progress Updates:  Session Note #1: Time: 0750-0830 (40 mins) Pt missed 20 mins of skilled therapy time secondary to eating breakfast. Pt reported 0/10 pain.  Upon entering room, pt seated EOB with breakfast tray in front. RN and OT encouraged pt to eat bfast secondary to back-to-back therapy sessions. Following this, pt collected clothing from nearby drawers. Pt transferred->w/c using slide board (supervision). Pt bathing task from w/c level at sink. Pt used lat leans for peri care. Pt transferred->bed using slide board (supervision). Pt required one v.c. to recall using pillow case to increase ease with slide board transfer when she does not have pants on. Pt completed dressing task seated EOB and rolled side<>side to pull up pants. Pt donned L limb sleeve (supervision) and R prosthesis (set-up assist). Pt transferred->w/c using slide board and with prosthesis on (supervision). At end of tx session, pt seated in w/c with PT coming in for next tx session.  Session Note #2: Time: 1100-1115 (15 mins) Pt with no report of pain.  OT made up 15 mins of missed time from AM session. Upon entering room, pt supine in bed. Pt->EOB. Skilled intervention focused on dynamic sitting  balance, BUE strengthening/endurance using T-band, and overall activity tolerance. OT and pt discussed home set-up for IADL's and using old w/c for showers initially. OT suggested pt wait to shower at home after [facility] OT comes in. Pt agreed. At end of tx session, pt supine in bed with call bell within reach. BA on.  Session Note #3: Time: 1300-1345 (45 mins) Pt reported 4/10 pain in L residual limb. Pt stated she did not want pain meds.  Upon entering room, pt seated EOB. Pt donned R prosthesis with set-up assist. Pt then transferred->w/c using slide board (supervision). Pt propelled w/c->rehab gym. Skilled intervention focused on w/c propulsion, BUE/RLE strengthening/endurance, bed mobility (rolling side<>side for increased independence with LB ADL's), fxal transfers, and overall activity tolerance. OT and pt discussed home set-up and modifications to increase independence/safety. Pt propelled w/c->room. At end of tx session, pt seated in w/c with call bell and phone within reach.  Therapy Documentation Precautions:  Precautions Precautions: Fall Precaution Comments: new L AKA, old R BKA,  Restrictions Weight Bearing Restrictions: Yes LLE Weight Bearing: Non weight bearing  See FIM for current functional status  Therapy/Group: Individual Therapy  Zellie Jenning 04/11/2013, 7:21 AM

## 2013-04-11 NOTE — Progress Notes (Signed)
Orthopedic Tech Progress Note Patient Details:  [name] Sep 04, 1956 [id number]  Patient ID: [name], female   DOB: May 16, 1956, 57 y.o.   MRN: [id number]   [name] 04/11/2013, 10:48 AMCalled advanced for left stump shrinker.

## 2013-04-11 NOTE — Progress Notes (Signed)
Notes reviewed and accurately reflect treatment sessions.   

## 2013-04-11 NOTE — Progress Notes (Addendum)
Patient ID: [name], female   DOB: 16-Sep-1955, 57 y.o.   MRN: [id number] Subjective/Complaints: Denies any issues. Pain better. Getting stronger with mobility, transfers A 12 point review of systems has been performed and if not noted above is otherwise negative.   Objective: Vital Signs: Blood pressure 104/65, pulse 83, temperature 98.1 F (36.7 C), temperature source Oral, resp. rate 18, height 5\' 9"  (1.753 m), weight 80.4 kg (177 lb 4 oz), SpO2 97.00%. No results found.  Recent Labs  04/09/13 1611 04/11/13 0705  WBC 4.9 3.6*  HGB 10.6* 10.6*  HCT 32.4* 33.8*  PLT 199 176    Recent Labs  04/09/13 1611  NA 133*  K 3.8  CL 95*  GLUCOSE 119*  BUN 21  CREATININE 5.52*  CALCIUM 8.8   CBG (last 3)   Recent Labs  04/10/13 1650 04/10/13 2156 04/11/13 0717  GLUCAP 88 172* 77    Wt Readings from Last 3 Encounters:  04/11/13 80.4 kg (177 lb 4 oz)  03/30/13 84 kg (185 lb 3 oz)  03/30/13 84 kg (185 lb 3 oz)    Physical Exam:  Nursing note and vitals reviewed.  Constitutional: She is oriented to person, place, and time. She appears well-developed and well-nourished.  HENT: oral mucosa pink and moist. Dentition borderline  Head: Normocephalic and atraumatic.  Eyes: Conjunctivae are normal. Pupils are equal, round, and reactive to light.  Neck: Normal range of motion.  Cardiovascular: HR 100-110, regular rhythm. +gallop Pulmonary/Chest: Effort normal and breath sounds normal. No respiratory distress. She has no wheezes.  Abdominal: Soft. Bowel sounds are normal. She exhibits no distension.  Musculoskeletal: She exhibits no tenderness.  R-BKA stump well healed.  L-AKA with staples/intact/only small area of serous drainage centrally. Left stump is appropriately tender.  Leg is less edematous. Neurological: She is alert and oriented to person, place, and time.  Alert and oriented. No cognitive deficits. No gross sensory loss. UE's are 4+/5 prox to distal. RLE is 4/5  at hip/knee. Can lift left hip agst gravity.  Skin: Skin is warm and dry.  Psychiatric: She has a normal mood and affect. Her speech is normal and behavior is normal. Judgment and thought content normal. Cognition and memory are normal.   Assessment/Plan: 1. 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 Physiatrist is providing close team supervision and 24 hour management of active medical problems listed below. Physiatrist and rehab team continue to assess barriers to discharge/monitor patient progress toward functional and medical goals.   FIM: FIM - Bathing Bathing Steps Patient Completed: Chest;Right Arm;Left Arm;Abdomen;Front perineal area;Buttocks;Right upper leg Bathing: 4: Min-Patient completes 8-9 6f 10 parts or 75+ percent  FIM - Upper Body Dressing/Undressing Upper body dressing/undressing steps patient completed: Thread/unthread right bra strap;Thread/unthread left bra strap;Thread/unthread right sleeve of pullover shirt/dresss;Thread/unthread left sleeve of pullover shirt/dress;Put head through opening of pull over shirt/dress;Pull shirt over trunk Upper body dressing/undressing: 7: Complete Independence: No helper FIM - Lower Body Dressing/Undressing Lower body dressing/undressing steps patient completed: Thread/unthread right underwear leg;Thread/unthread left underwear leg;Pull pants up/down;Thread/unthread right pants leg;Pull underwear up/down;Thread/unthread left pants leg Lower body dressing/undressing: 7: Complete Independence: No helper  FIM - Toileting Toileting: 0: Activity did not occur  FIM - Diplomatic Services operational officer Devices: Human resources officer Transfers: 0-Activity did not occur  FIM - Banker Devices: Sliding board;Bed rails;Arm rests  Bed/Chair  Transfer: 5: Bed > Chair or W/C: Supervision (verbal cues/safety issues);5: Chair or W/C > Bed: Supervision (verbal cues/safety issues)  FIM - Locomotion: Wheelchair Distance: 150 Locomotion: Wheelchair: 5: Travels 150 ft or more: maneuvers on rugs and over door sills with supervision, cueing or coaxing FIM - Locomotion: Ambulation Locomotion: Ambulation: 0: Activity did not occur  Comprehension Comprehension Mode: Auditory Comprehension: 6-Follows complex conversation/direction: With extra time/assistive device  Expression Expression Mode: Verbal Expression: 6-Expresses complex ideas: With extra time/assistive device  Social Interaction Social Interaction Mode: Asleep Social Interaction: 6-Interacts appropriately with others with medication or extra time (anti-anxiety, antidepressant).  Problem Solving Problem Solving Mode: Asleep Problem Solving: 5-Solves complex 90% of the time/cues < 10% of the time  Memory Memory Mode: Asleep Memory: 5-Recognizes or recalls 90% of the time/requires cueing < 10% of the time Medical Problem List and Plan:  1. DVT Prophylaxis/Anticoagulation: Pharmaceutical: Lovenox  2. Pain Management: stump pain improving. Will monitor and adjust. Premedicated prior to therapies.  3. Mood: seems to be dealing with situation appropriately. Provide ego support. Will have LCSW follow for evaluation/support.  4. Neuropsych: This patient is capable of making decisions on her own behalf.  5. ESRD: HD on M, W, F. Check daily weights with 1200 cc fluid restrictions.   6. DM type 2: Hypoglycemic episodes noted.off lantus and not on CM diet. Sugars controlled at present 7. CAD with ICM: Asymptomatic. On midodrine due to hypotension. Resumed ASA. Did not use plavix at home.   -recent tachycardia---resolved 8. Anemia of chronic disease: On aranesp weekly with routine monitoring of labs in dialysis.  9. OSA: Resume CPAP--uses face mask at home.  10. Pseudomonas wound  infection: Gentamicin completed  -  Leukocytosis resolved  -dry dressing. Will order stump shrinker.per advanced.  LOS (Days) 12 A FACE TO FACE EVALUATION WAS PERFORMED  SWARTZ,ZACHARY T 04/11/2013 8:28 AM

## 2013-04-11 NOTE — Progress Notes (Signed)
Subjective: Seen on dialysis, no complaints, anticipating discharge on 8/1.  Objective: Vital signs in last 24 hours: Temp:  [97 F (36.1 C)-98.9 F (37.2 C)] 97 F (36.1 C) (07/30 1420) Pulse Rate:  [83-93] 90 (07/30 1500) Resp:  [16-18] 16 (07/30 1500) BP: (104-129)/(61-76) 114/61 mmHg (07/30 1500) SpO2:  [97 %-99 %] 98 % (07/30 1420) Weight:  [80.4 kg (177 lb 4 oz)-81.3 kg (179 lb 3.7 oz)] 81.3 kg (179 lb 3.7 oz) (07/30 1420) Weight change: -1.3 kg (-2 lb 13.9 oz)  Intake/Output from previous day: 07/29 0701 - 07/30 0700 In: 240 [P.O.:240] Out: -  Intake/Output this shift: Total I/O In: 340 [P.O.:340] Out: -  EXAM: General appearance:  Alert, in no apparent distress Resp:  CTA without rales, rhonchi, or wheezes Cardio:  RRR with Gr II/VI systolic murmur, no rub GI:  + BS, soft and nontender Extremities: L AKA with dressing, R BKA with no edema Access:  AVF @ RUA with BFR 400 cc/min  Lab Results:  Recent Labs  04/09/13 1611 04/11/13 0705  WBC 4.9 3.6*  HGB 10.6* 10.6*  HCT 32.4* 33.8*  PLT 199 176   BMET:  Recent Labs  04/09/13 1611  NA 133*  K 3.8  CL 95*  CO2 26  GLUCOSE 119*  BUN 21  CREATININE 5.52*  CALCIUM 8.8  ALBUMIN 2.1*   No results found for this basename: PTH,  in the last 72 hours Iron Studies: No results found for this basename: IRON, TIBC, TRANSFERRIN, FERRITIN,  in the last 72 hours  Dialysis Orders ([facility] MWF)  4hrs Dry wt 90.5kg Heparin 2800 R AVF Profile 2  Hectorol Epogen / Venofer- none   Assessment/Plan: 1. 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 Edilia Bo 7/15; rehab since 7/18.  2. Sepsis/left groin wound - s/p Gentamicin.  3. ESRD - HD on MWF @ [facility]. HD today.  4. HTN/Volume - BP 114/61 on Midodrine pre-HD; current wt 81.3, but previously down to 79.1. Establish new EDW.  5. Anemia - Hgb 10.1 on Aranesp 100 mcg on Wed.  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  7. Nutrition - Alb 2.2, regular diet, vitamin. 8. DM - on insulin.  9. Ischemic CM    LOS: 12 days   LYLES,CHARLES 04/11/2013,3:27 PM  Patient seen and examined.  Agree with assessment and plan as above. Vinson Moselle  MD Pager [phone]    Cell  [phone] 04/11/2013, 4:43 PM

## 2013-04-11 NOTE — Procedures (Signed)
I was present at this dialysis session. I have reviewed the session itself and made appropriate changes.   Vinson Moselle  MD Pager [phone]    Cell  [phone] 04/11/2013, 4:50 PM

## 2013-04-11 NOTE — Progress Notes (Signed)
Patient refuses CPAP at this time. RT will continue to monitor 

## 2013-04-12 ENCOUNTER — Inpatient Hospital Stay (HOSPITAL_COMMUNITY): Payer: Medicare Other

## 2013-04-12 ENCOUNTER — Inpatient Hospital Stay (HOSPITAL_COMMUNITY): Payer: Medicare Other | Admitting: Occupational Therapy

## 2013-04-12 ENCOUNTER — Ambulatory Visit: Payer: Self-pay | Admitting: Neurology

## 2013-04-12 LAB — GLUCOSE, CAPILLARY
Glucose-Capillary: 87 mg/dL (ref 70–99)
Glucose-Capillary: 112 mg/dL — ABNORMAL HIGH (ref 70–99)
Glucose-Capillary: 108 mg/dL — ABNORMAL HIGH (ref 70–99)
Glucose-Capillary: 121 mg/dL — ABNORMAL HIGH (ref 70–99)

## 2013-04-12 MED ORDER — SEVELAMER CARBONATE 800 MG PO TABS
800.0000 mg | ORAL_TABLET | Freq: Three times a day (TID) | ORAL | Status: DC
  Administered 2013-04-12 – 2013-04-13 (×3): 800 mg via ORAL
  Filled 2013-04-12 (×6): qty 1

## 2013-04-12 NOTE — Progress Notes (Signed)
Social Work Patient ID: [name], female   DOB: Sep 02, 1956, 57 y.o.   MRN: [id number]  Met yesterday with patient to review team conference.  Pt agrees with report of good progress this week.  Confirms plan is to return to her own home which is w/c accessible.  No concerns about d/c at this point.  Continue to follow.  Jaiel Saraceno, LCSW

## 2013-04-12 NOTE — Progress Notes (Signed)
Per pt request CBG checked at 0520 this am. Pt stated "Can you check my sugar I think it might be low." CBG of 87 pt c/o light headedness snack provided will continue to monitor.

## 2013-04-12 NOTE — Progress Notes (Signed)
Physical Therapy Discharge Summary  Patient Details  Name: [name] MRN: [id number] Date of Birth: 07/26/1956  Today's Date: 04/12/2013 Time: 1130-1200 Time Calculation (min): 30 min  Therapy session focused on functional transfers and w/c propulsion. Pt performed supine>sit with mod I and transferred from bed>w/c with mod I using sliding board without VC's for set up. Pt performed w/c propulsion with mod I for >150 ft with mod I for UE strengthening and cardiovascular endurance. Pt denies c/o pain and reports that she has not needed to take pain medication at all today. Pt presents with increased activity tolerance and endurance as she was able to perform car transfer without taking a prolonged seated rest break after performing transfer.    Patient has met 7 of 7 long term goals due to improved activity tolerance, improved balance, improved postural control, increased strength, increased range of motion, decreased pain, ability to compensate for deficits and functional use of  left lower extremity.  Patient to discharge at a wheelchair level Modified Independent.  No family training was provided secondary to pt deferred training as she verbalized that she is confident that she will be able to verbally instruct family members if she needs help, additionally pt is currently at Mod I w/c level therefore physical assistance is not anticipated.    Reasons goals not met: N/A all goals met  Recommendation:  Patient will benefit from ongoing skilled PT services in home health setting to continue to advance safe functional mobility, address ongoing impairments in decreased activity tolerance, endurance, strength, functional mobility, transfers and minimize fall risk.  Equipment: 18x18 Amputee w/c with posterior axle, standard cushion, and long sliding board  Reasons for discharge: treatment goals met and discharge from hospital  Patient/family agrees with progress made and goals achieved:  Yes  PT Discharge Precautions/Restrictions Precautions Precautions: Fall Precaution Comments: new L AKA, old R BKA Restrictions Weight Bearing Restrictions: Yes LLE Weight Bearing: Non weight bearing Vision/Perception  Vision - History Baseline Vision: Wears glasses only for reading Patient Visual Report: No change from baseline Vision - Assessment Eye Alignment: Within Functional Limits Perception Perception: Within Functional Limits Praxis Praxis: Intact  Cognition Overall Cognitive Status: Within Functional Limits for tasks assessed Arousal/Alertness: Awake/alert Orientation Level: Oriented X4 Awareness: Appears intact Problem Solving: Appears intact Safety/Judgment: Appears intact Sensation Sensation Light Touch: Appears Intact (B/L LE ) Stereognosis: Appears Intact Hot/Cold: Appears Intact Additional Comments: Phantom pain in LLE Coordination Gross Motor Movements are Fluid and Coordinated: Yes Fine Motor Movements are Fluid and Coordinated: Yes Motor  Motor Motor: Within Functional Limits Motor - Skilled Clinical Observations: Generalized muscle weakness (UE and B/L LE )   Trunk/Postural Assessment  Cervical Assessment Cervical Assessment: Within Functional Limits Thoracic Assessment Thoracic Assessment: Within Functional Limits Lumbar Assessment Lumbar Assessment: Within Functional Limits Postural Control Postural Control: Within Functional Limits Trunk Control: Pt is able to maintain trunk control in sitting without the use of B/L UE  Balance Balance Balance Assessed: Yes Static Sitting Balance Static Sitting - Level of Assistance: 6: Modified independent (Device/Increase time) Dynamic Sitting Balance Dynamic Sitting - Level of Assistance: 6: Modified independent (Device/Increase time) Extremity Assessment  RUE Assessment RUE Assessment: Within Functional Limits LUE Assessment LUE Assessment: Within Functional Limits RLE Assessment RLE  Assessment: Within Functional Limits (Grossly 4/5 hip flexion, knee flexion/extension ) LLE Assessment LLE Assessment: Exceptions to [facility] LLE Strength LLE Overall Strength: Deficits LLE Overall Strength Comments: hip flexion: +3/5; hip ABD/ADD: -4/5;  See FIM for current functional status  [name], Ileene Hutchinson 04/12/2013, 12:08 PM

## 2013-04-12 NOTE — Progress Notes (Signed)
Recreational Therapy Discharge Summary Patient Details  Name: [name] MRN: [id number] Date of Birth: 1956-04-02 Today's Date: 04/12/2013  Long term goals set: 1  Long term goals met: 1  Comments on progress toward goals: Pt has made great progress toward goals while on CIR and is ready for discharge home tomorrow w/c level at Mod I level.  Pt safely completes tasks given extra time & use of AE.   Reasons for discharge: discharge from hospital Patient/family agrees with progress made and goals achieved: Yes  [name] 04/12/2013, 2:11 PM

## 2013-04-12 NOTE — Progress Notes (Addendum)
Recreational Therapy Session Note  Patient Details  Name: [name] MRN: [id number] Date of Birth: 21-Jun-1956 Today's Date: 04/12/2013 Time:   Pain: no c/o Skilled Therapeutic Interventions/Progress Updates: Session focused on simple meal prep in kitchen w/c level with supervision.  Pt did require set up assist in ADL kitchen, needing appropriate items moved within reach for task completion.  Pt demonstrated good safety throughout cooking activity including retrieval of items from various locations, use of stove top, placement of hot pot & moving it across countertop on pot holder.  Pt states her kitchen is "handicap accessible and that she will be able to compete meal prep tasks without difficulty at home.  Discussion with pt about discharge concerns & pt states she feels ready to discharge home tomorrow.  Therapy/Group: Co-Treatment  Yelitza Reach 04/12/2013, 2:04 PM

## 2013-04-12 NOTE — Progress Notes (Signed)
Physical Therapy Session Note  Patient Details  Name: [name] MRN: [id number] Date of Birth: March 15, 1956  Today's Date: 04/12/2013 Time: 9528-4132 Time Calculation (min): 58 min  Short Term Goals: Week 2:  PT Short Term Goal 1 (Week 2): =LTG  Skilled Therapeutic Interventions/Progress Updates:    Therapy session focused on bed mobility and therapeutic exercise. Pt performed bed mobility (supine<>sit) with mod I in the simulated home environment. Pt completed w/c<>bed sliding board transfer with mod I with one incidence of needing a VC's for sliding board setup. Pt performed mat table exercises that consisted of left residual hip flexion (supine), bridging with use of R prosthesis and bolster under L residual limb, L hip ABD/ADD, and glut squeezes (1 set x 10 reps) to improve functional mobility and transfers. Pt was educated on and demonstrated HEP and provided with pt handout. Pt completed NuStep (resistance: 4) for B/L UE, R residual limb strengthening, and cardiovascular endurance. No c/o pain during therapy session.   Therapy Documentation Precautions:  Precautions Precautions: Fall Precaution Comments: new L AKA, old R BKA Restrictions Weight Bearing Restrictions: Yes LLE Weight Bearing: Non weight bearing  See FIM for current functional status  Therapy/Group: Individual Therapy  [name], Surie Suchocki 04/12/2013, 2:41 PM

## 2013-04-12 NOTE — Progress Notes (Signed)
Reviewed and in agreement with treatment provided.  

## 2013-04-12 NOTE — Progress Notes (Signed)
Reviewed and in agreement with treatment and discharge assessment provided.  

## 2013-04-12 NOTE — Progress Notes (Signed)
Discharge note and session notes reviewed and all accurately reflect discharge summary and treatment sessions.  

## 2013-04-12 NOTE — Progress Notes (Addendum)
Subjective: No complaints, anticipating discharge on 8/1  Objective: Vital signs in last 24 hours: Temp:  [97 F (36.1 C)-98.6 F (37 C)] 98.6 F (37 C) (07/31 0452) Pulse Rate:  [85-97] 97 (07/31 0452) Resp:  [14-20] 18 (07/31 0452) BP: (102-122)/(55-69) 104/69 mmHg (07/31 0452) SpO2:  [98 %-100 %] 100 % (07/31 0452) Weight:  [79.2 kg (174 lb 9.7 oz)-81.3 kg (179 lb 3.7 oz)] 79.2 kg (174 lb 9.7 oz) (07/31 0452) Weight change: 0.9 kg (1 lb 15.7 oz)  Intake/Output from previous day: 07/30 0701 - 07/31 0700 In: 440 [P.O.:440] Out: 1500  Intake/Output this shift:   EXAM: General appearance:  Alert, in no apparent distress Resp:  CTA without rales, rhonchi, or wheezes Cardio:  RRR with Gr II/VI systolic murmur, no rub GI:  + BS, soft and nontender Extremities: L AKA with dressing, R BKA with no edema Access:  AVF @ RUA with BFR 400 cc/min  Lab Results:  Recent Labs  04/09/13 1611 04/11/13 0705  WBC 4.9 3.6*  HGB 10.6* 10.6*  HCT 32.4* 33.8*  PLT 199 176   BMET:   Recent Labs  04/09/13 1611 04/11/13 1439  NA 133* 128*  K 3.8 4.5  CL 95* 94*  CO2 26 25  GLUCOSE 119* 174*  BUN 21 19  CREATININE 5.52* 4.65*  CALCIUM 8.8 8.6  ALBUMIN 2.1* 2.2*   No results found for this basename: PTH,  in the last 72 hours Iron Studies: No results found for this basename: IRON, TIBC, TRANSFERRIN, FERRITIN,  in the last 72 hours  Dialysis Orders ([facility] MWF)  4hrs Dry wt 90.5kg Heparin 2800 R AVF Profile 2  Hectorol Epogen / Venofer- none   Assessment/Plan: 1. Left AKA Dr. Edilia Bo 7/15 (gangrene, osteo) 2. L groin wound / GNR bacteremia - s/p course of Gentamicin, wounds almost healed over  3. ESRD, mwf HD. HD in am  4. HTN/Volume - BP 114/61 on Midodrine pre HD; new dry weight 80kg 5. Anemia - Hgb 10.1 on Aranesp 100 mcg on Wed.  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 Nutrition -  Alb 2.2, regular diet, vitamin. 8. DM - on insulin.  9. Ischemic CM 10. Pharm- went thru meds, d/c'd protonix and plavix from home med list, was not taking; d/c'd protonix from inpt med list, does not want to take at discharge  Candace Moselle  MD Pager [phone]    Cell  [phone] 04/12/2013, 9:30 AM

## 2013-04-12 NOTE — Progress Notes (Signed)
Patient ID: [name], female   DOB: 1955/12/04, 57 y.o.   MRN: [id number] Subjective/Complaints: Feeling stronger. Pain controlled. Wants to know how long she needs to wear shrinker each day.  A 12 point review of systems has been performed and if not noted above is otherwise negative.   Objective: Vital Signs: Blood pressure 104/69, pulse 97, temperature 98.6 F (37 C), temperature source Oral, resp. rate 18, height 5\' 9"  (1.753 m), weight 79.2 kg (174 lb 9.7 oz), SpO2 100.00%. No results found.  Recent Labs  04/09/13 1611 04/11/13 0705  WBC 4.9 3.6*  HGB 10.6* 10.6*  HCT 32.4* 33.8*  PLT 199 176    Recent Labs  04/09/13 1611 04/11/13 1439  NA 133* 128*  K 3.8 4.5  CL 95* 94*  GLUCOSE 119* 174*  BUN 21 19  CREATININE 5.52* 4.65*  CALCIUM 8.8 8.6   CBG (last 3)   Recent Labs  04/11/13 2055 04/12/13 0514 04/12/13 0721  GLUCAP 107* 87 95    Wt Readings from Last 3 Encounters:  04/12/13 79.2 kg (174 lb 9.7 oz)  03/30/13 84 kg (185 lb 3 oz)  03/30/13 84 kg (185 lb 3 oz)    Physical Exam:  Nursing note and vitals reviewed.  Constitutional: She is oriented to person, place, and time. She appears well-developed and well-nourished.  HENT: oral mucosa pink and moist. Dentition borderline  Head: Normocephalic and atraumatic.  Eyes: Conjunctivae are normal. Pupils are equal, round, and reactive to light.  Neck: Normal range of motion.  Cardiovascular: HR 100-110, regular rhythm. +gallop Pulmonary/Chest: Effort normal and breath sounds normal. No respiratory distress. She has no wheezes.  Abdominal: Soft. Bowel sounds are normal. She exhibits no distension.  Musculoskeletal: She exhibits no tenderness.  R-BKA stump well healed.  L-AKA with staples/intact/only small area of serous drainage centrally. Left stump is appropriately tender.  Leg is less edematous. Neurological: She is alert and oriented to person, place, and time.  Alert and oriented. No cognitive  deficits. No gross sensory loss. UE's are 4+/5 prox to distal. RLE is 4/5 at hip/knee. Can lift left hip agst gravity.  Skin: Skin is warm and dry.  Psychiatric: She has a normal mood and affect. Her speech is normal and behavior is normal. Judgment and thought content normal. Cognition and memory are normal.   Assessment/Plan: 1. 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 Physiatrist is providing close team supervision and 24 hour management of active medical problems listed below. Physiatrist and rehab team continue to assess barriers to discharge/monitor patient progress toward functional and medical goals.   FIM: FIM - Bathing Bathing Steps Patient Completed: Chest;Right Arm;Left Arm;Abdomen;Front perineal area;Buttocks;Right upper leg;Left upper leg Bathing: 6: Assistive device (Comment)  FIM - Upper Body Dressing/Undressing Upper body dressing/undressing steps patient completed: Thread/unthread right bra strap;Thread/unthread left bra strap;Thread/unthread right sleeve of pullover shirt/dresss;Thread/unthread left sleeve of pullover shirt/dress;Put head through opening of pull over shirt/dress;Pull shirt over trunk Upper body dressing/undressing: 7: Complete Independence: No helper FIM - Lower Body Dressing/Undressing Lower body dressing/undressing steps patient completed: Thread/unthread right underwear leg;Thread/unthread left underwear leg;Pull pants up/down;Thread/unthread right pants leg;Pull underwear up/down;Thread/unthread left pants leg Lower body dressing/undressing: 6: Assistive device (Comment)  FIM - Toileting Toileting: 0: Activity did not occur  FIM - Diplomatic Services operational officer Devices: Human resources officer Transfers: 0-Activity did not occur  FIM -  Bed/Chair Transport planner  Devices: Sliding board;Arm rests;HOB elevated Bed/Chair Transfer: 6: Bed > Chair or W/C: No assist;6: Chair or W/C > Bed: No assist  FIM - Locomotion: Wheelchair Distance: 150 Locomotion: Wheelchair: 6: Travels 150 ft or more, turns around, maneuvers to table, bed or toilet, negotiates 3% grade: maneuvers on rugs and over door sills independently FIM - Locomotion: Ambulation Locomotion: Ambulation: 0: Activity did not occur  Comprehension Comprehension Mode: Auditory Comprehension: 6-Follows complex conversation/direction: With extra time/assistive device  Expression Expression Mode: Verbal Expression: 6-Expresses complex ideas: With extra time/assistive device  Social Interaction Social Interaction Mode: Asleep Social Interaction: 6-Interacts appropriately with others with medication or extra time (anti-anxiety, antidepressant).  Problem Solving Problem Solving Mode: Asleep Problem Solving: 6-Solves complex problems: With extra time  Memory Memory Mode: Asleep Memory: 6-More than reasonable amt of time Medical Problem List and Plan:  1. DVT Prophylaxis/Anticoagulation: Pharmaceutical: Lovenox  2. Pain Management: stump pain improving. Will monitor and adjust. Premedicated prior to therapies.  3. Mood: seems to be dealing with situation appropriately. Provide ego support. Will have LCSW follow for evaluation/support.  4. Neuropsych: This patient is capable of making decisions on her own behalf.  5. ESRD: HD on M, W, F. Check daily weights with 1200 cc fluid restrictions.   6. DM type 2: Hypoglycemic episodes noted.off lantus and not on CM diet. Sugars controlled at present 7. CAD with ICM: Asymptomatic. On midodrine due to hypotension. Resumed ASA. Did not use plavix at home.   -recent tachycardia---resolved 8. Anemia of chronic disease: On aranesp weekly with routine monitoring of labs in dialysis.  9. OSA: Resume CPAP--uses face mask at home.  10. Pseudomonas wound infection:  Gentamicin completed  -  Leukocytosis resolved  -dry dressing. Will order stump shrinker.per advanced.  LOS (Days) 13 A FACE TO FACE EVALUATION WAS PERFORMED  Nivek Powley T 04/12/2013 10:05 AM

## 2013-04-12 NOTE — Progress Notes (Signed)
Orthopedic Tech Progress Note Patient Details:  [name] 12-14-1955 [id number]  Patient ID: [name], female   DOB: 1956-03-16, 57 y.o.   MRN: [id number]   [name] 04/12/2013, 3:25 PMAdvanced left AKA stump shrinker.

## 2013-04-13 LAB — GLUCOSE, CAPILLARY

## 2013-04-13 MED ORDER — OXYCODONE HCL 5 MG PO TABS: 5.0000 mg | ORAL_TABLET | Freq: Three times a day (TID) | ORAL | Status: DC | PRN

## 2013-04-13 MED ORDER — SEVELAMER CARBONATE 800 MG PO TABS: 800.0000 mg | ORAL_TABLET | Freq: Three times a day (TID) | ORAL | Status: DC

## 2013-04-13 MED ORDER — HEPARIN SODIUM (PORCINE) 1000 UNIT/ML IJ SOLN
2800.0000 [IU] | Freq: Once | INTRAMUSCULAR | Status: AC
  Administered 2013-04-13: 2800 [IU] via INTRAVENOUS

## 2013-04-13 MED ORDER — COLLAGENASE 250 UNIT/GM EX OINT: 1.0000 "application " | TOPICAL_OINTMENT | Freq: Every day | CUTANEOUS | Status: DC

## 2013-04-13 MED ORDER — ASPIRIN 325 MG PO TABS: 325.0000 mg | ORAL_TABLET | Freq: Every day | ORAL | Status: DC

## 2013-04-13 MED ORDER — RENA-VITE PO TABS: 1.0000 | ORAL_TABLET | Freq: Every day | ORAL | Status: DC

## 2013-04-13 NOTE — Progress Notes (Signed)
Subjective:  On hd no cos Objective Vital signs in last 24 hours: Filed Vitals:   04/13/13 0800 04/13/13 0830 04/13/13 0900 04/13/13 0930  BP: 103/60 110/60 109/62 112/59  Pulse: 86 86 85 87  Temp:      TempSrc:      Resp: 17 19 17 18   Height:      Weight:      SpO2:       Weight change: -0.2 kg (-7.1 oz)  Intake/Output Summary (Last 24 hours) at 04/13/13 0949 Last data filed at 04/12/13 1300  Gross per 24 hour  Intake    120 ml  Output      0 ml  Net    120 ml   Labs: Basic Metabolic Panel:  Recent Labs Lab 04/06/13 1400 04/09/13 1611 04/11/13 1439  NA 133* 133* 128*  K 3.3* 3.8 4.5  CL 95* 95* 94*  CO2 26 26 25   GLUCOSE 112* 119* 174*  BUN 14 21 19   CREATININE 5.15* 5.52* 4.65*  CALCIUM 8.2* 8.8 8.6  PHOS 3.7 3.3 2.6   Liver Function Tests:  Recent Labs Lab 04/06/13 1400 04/09/13 1611 04/11/13 1439  ALBUMIN 2.1* 2.1* 2.2*   No results found for this basename: LIPASE, AMYLASE,  in the last 168 hours No results found for this basename: AMMONIA,  in the last 168 hours CBC:  Recent Labs Lab 04/06/13 1400 04/09/13 1611 04/11/13 0705  WBC 6.2 4.9 3.6*  HGB 10.1* 10.6* 10.6*  HCT 32.0* 32.4* 33.8*  MCV 86.0 85.0 87.6  PLT 172 199 176   Cardiac Enzymes: No results found for this basename: CKTOTAL, CKMB, CKMBINDEX, TROPONINI,  in the last 168 hours CBG:  Recent Labs Lab 04/12/13 0514 04/12/13 0721 04/12/13 1114 04/12/13 1620 04/12/13 2036  GLUCAP 87 95 112* 108* 121*   Physical Exam: General: alert on hd NAD Heart: RRR, no rub or mur.  Lungs: CTA bilat.  Abdomen: BS pos. , soft, nontender Extremities: Dialysis Access: L AKA dressing dry/ clean, l groin dressing dry and clean/ R bka no edema/ patent on hd  L u a AVF  Dialysis Orders ([facility] MWF)  4hrs Dry wt 90.5kg Heparin 2800 R AVF Profile 2  Hectorol Epogen / Venofer- none   Assessment/Plan:  1. Left AKA Dr. Edilia Bo 7/15 (gangrene, osteo)/ now on rehab  2. L groin wound / GNR  bacteremia - s/p course of Gentamicin, wounds healing well  3. ESRD, mwf HD. HD on schedule  Labs pending this am 4. HTN/Volume - BP 116/ 41 stable on hd on Midodrine pre HD;new EDW will be around 80KG 5. Anemia - Hgb stable yesterday 10.6 on Aranesp 100 mcg on Wed./ no iron  6. Sec HPT - yesterday labs= Ca 8.6 (10 corrected), P 2.6; Hectorol 1 mcg, Sensipar 60 mg qd, restarted Renvela 1ac tid (was on 3 ac tid pta) fu phos on reg. Diet  With decr po intake  7. Nutrition - Alb 2.2, regular diet, vitamin. 8. DM - on insulin.  9. Ischemic CM stable   Lenny Pastel, PA-C [facility] Beeper [phone] 04/13/2013,9:49 AM  LOS: 14 days   Patient seen and examined.  Agree with assessment and plan as above.  For discharge today Vinson Moselle  MD Pager [phone]    Cell  [phone] 04/13/2013, 1:27 PM

## 2013-04-13 NOTE — Procedures (Signed)
I was present at this dialysis session. I have reviewed the session itself and made appropriate changes.   Rob Keyundra Fant  MD Pager [phone]    Cell  [phone] 04/13/2013, 11:14 AM    

## 2013-04-13 NOTE — Progress Notes (Addendum)
Discharged to home accompanied by dtr. Discharge instructions given and no questions noted. [name]

## 2013-04-13 NOTE — Progress Notes (Signed)
Patient ID: [name], female   DOB: 12/26/1955, 57 y.o.   MRN: [id number] Subjective/Complaints: No new problems. In early HD today prior to discharge.  A 12 point review of systems has been performed and if not noted above is otherwise negative.   Objective: Vital Signs: Blood pressure 113/53, pulse 85, temperature 98.5 F (36.9 C), temperature source Oral, resp. rate 16, height 5\' 9"  (1.753 m), weight 81.3 kg (179 lb 3.7 oz), SpO2 96.00%. No results found.  Recent Labs  04/11/13 0705  WBC 3.6*  HGB 10.6*  HCT 33.8*  PLT 176    Recent Labs  04/11/13 1439  NA 128*  K 4.5  CL 94*  GLUCOSE 174*  BUN 19  CREATININE 4.65*  CALCIUM 8.6   CBG (last 3)   Recent Labs  04/12/13 1114 04/12/13 1620 04/12/13 2036  GLUCAP 112* 108* 121*    Wt Readings from Last 3 Encounters:  04/13/13 81.3 kg (179 lb 3.7 oz)  03/30/13 84 kg (185 lb 3 oz)  03/30/13 84 kg (185 lb 3 oz)    Physical Exam:  Nursing note and vitals reviewed.  Constitutional: She is oriented to person, place, and time. She appears well-developed and well-nourished.  HENT: oral mucosa pink and moist. Dentition borderline  Head: Normocephalic and atraumatic.  Eyes: Conjunctivae are normal. Pupils are equal, round, and reactive to light.  Neck: Normal range of motion.  Cardiovascular: HR 100-110, regular rhythm. +gallop Pulmonary/Chest: Effort normal and breath sounds normal. No respiratory distress. She has no wheezes.  Abdominal: Soft. Bowel sounds are normal. She exhibits no distension.  Musculoskeletal: She exhibits no tenderness.  R-BKA stump well healed.  L-AKA with staples/intact/only small area of serous drainage centrally. Left stump is appropriately tender.  Leg is less edematous. Neurological: She is alert and oriented to person, place, and time.  Alert and oriented. No cognitive deficits. No gross sensory loss. UE's are 4+/5 prox to distal. RLE is 4/5 at hip/knee. Can lift left hip agst gravity.   Skin: Skin is warm and dry.  Psychiatric: She has a normal mood and affect. Her speech is normal and behavior is normal. Judgment and thought content normal. Cognition and memory are normal.   Assessment/Plan: 1. 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 Physiatrist is providing close team supervision and 24 hour management of active medical problems listed below. Physiatrist and rehab team continue to assess barriers to discharge/monitor patient progress toward functional and medical goals.  Home today.   FIM: FIM - Bathing Bathing Steps Patient Completed: Chest;Right Arm;Left Arm;Abdomen;Front perineal area;Buttocks;Right upper leg;Left upper leg Bathing: 6: Assistive device (Comment)  FIM - Upper Body Dressing/Undressing Upper body dressing/undressing steps patient completed: Thread/unthread right bra strap;Thread/unthread left bra strap;Thread/unthread right sleeve of pullover shirt/dresss;Thread/unthread left sleeve of pullover shirt/dress;Put head through opening of pull over shirt/dress;Pull shirt over trunk Upper body dressing/undressing: 7: Complete Independence: No helper FIM - Lower Body Dressing/Undressing Lower body dressing/undressing steps patient completed: Thread/unthread right underwear leg;Thread/unthread left underwear leg;Pull pants up/down;Thread/unthread right pants leg;Pull underwear up/down;Thread/unthread left pants leg Lower body dressing/undressing: 6: Assistive device (Comment)  FIM - Toileting Toileting: 0: Activity did not occur  FIM - Diplomatic Services operational officer Devices: Human resources officer Transfers: 0-Activity did not occur  FIM - Banker Devices: Sliding board Bed/Chair Transfer: 6: Bed > Chair or W/C: No  assist;6: Chair or W/C > Bed: No  assist;6: Sit > Supine: No assist;6: Supine > Sit: No assist  FIM - Locomotion: Wheelchair Distance: 150 Locomotion: Wheelchair: 6: Travels 150 ft or more, turns around, maneuvers to table, bed or toilet, negotiates 3% grade: maneuvers on rugs and over door sills independently FIM - Locomotion: Ambulation Locomotion: Ambulation: 0: Activity did not occur (B/L amputee ( L acute AKA; R chronic BKA) )  Comprehension Comprehension Mode: Auditory Comprehension: 6-Follows complex conversation/direction: With extra time/assistive device  Expression Expression Mode: Verbal Expression: 6-Expresses complex ideas: With extra time/assistive device  Social Interaction Social Interaction Mode: Asleep Social Interaction: 6-Interacts appropriately with others with medication or extra time (anti-anxiety, antidepressant).  Problem Solving Problem Solving Mode: Asleep Problem Solving: 6-Solves complex problems: With extra time  Memory Memory Mode: Asleep Memory: 6-More than reasonable amt of time Medical Problem List and Plan:  1. DVT Prophylaxis/Anticoagulation: Pharmaceutical: Lovenox  2. Pain Management: stump pain improving. Will monitor and adjust. Premedicated prior to therapies.  3. Mood: seems to be dealing with situation appropriately. Provide ego support. Will have LCSW follow for evaluation/support.  4. Neuropsych: This patient is capable of making decisions on her own behalf.  5. ESRD: HD on M, W, F. Check daily weights with 1200 cc fluid restrictions.  HD this am prior to dc. 6. DM type 2: Hypoglycemic episodes noted.off lantus and not on CM diet. Sugars controlled at present 7. CAD with ICM: Asymptomatic. On midodrine due to hypotension. Resumed ASA. Did not use plavix at home.   -recent tachycardia---resolved 8. Anemia of chronic disease: On aranesp weekly with routine monitoring of labs in dialysis.  9. OSA: Resume CPAP--uses face mask at home.  10. Pseudomonas wound infection:  Gentamicin completed  -  Leukocytosis resolved  -dry dressing. Will order stump shrinker.per advanced.  LOS (Days) 14 A FACE TO FACE EVALUATION WAS PERFORMED  Byron Peacock T 04/13/2013 10:47 AM

## 2013-04-13 NOTE — Discharge Summary (Signed)
Physician Discharge Summary  Patient ID: [name] MRN: [id number] DOB/AGE: [birth year] 57 y.o.  Admit date: 03/30/2013 Discharge date: 04/13/2013  Discharge Diagnoses:  Principal Problem:   Unilateral AKA--left Active Problems:   OBSTRUCTIVE SLEEP APNEA   DM (diabetes mellitus), secondary, w/peripheral vascular complications   Sepsis   Wound dehiscence, surgical--left groin   Leucocytosis   Hx of right BKA   Discharged Condition: Improved.   Significant Diagnostic Studies: Dg Foot 2 Views Left  03/25/2013   *RADIOLOGY REPORT*  Clinical Data: Draining wound of the posterior left heel.  LEFT FOOT - 2 VIEW  Comparison: 02/02/2013  Findings: There is separation of bone with irregular cortical margins at the level of the posterior calcaneus.  Visible soft tissue wound is present overlying this region and findings are consistent with osteomyelitis and associated avulsion of bone at the insertion of the Achilles tendon.  Diffuse diabetic related calcifications are seen in the arteries of the foot.  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 Soft tissue wound is evident in this region.   Original Report Authenticated By: Irish Lack, M.D.    Labs:  Basic Metabolic Panel:  Recent Labs Lab 04/09/13 1611 04/11/13 1439  NA 133* 128*  K 3.8 4.5  CL 95* 94*  CO2 26 25  GLUCOSE 119* 174*  BUN 21 19  CREATININE 5.52* 4.65*  CALCIUM 8.8 8.6  PHOS 3.3 2.6    CBC:  Recent Labs Lab 04/09/13 1611 04/11/13 0705  WBC 4.9 3.6*  HGB 10.6* 10.6*  HCT 32.4* 33.8*  MCV 85.0 87.6  PLT 199 176    CBG:  Recent Labs Lab 04/12/13 0721 04/12/13 1114 04/12/13 1620 04/12/13 2036 04/13/13 1145  GLUCAP 95 112* 108* 121* 73    Brief HPI:   [name] is a 57 y.o. 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  She was admitted on 03/15/13 past fall and reports of 1-2 week history of weakness and workup revealed osteomyelitis with wound culture positive for Pseudomonas. She was started on IV antibiotics and on 07/15, patient underwent L-AKA with placement of VAC by Dr. Edilia Bo.  Therapies initiated past surgery and CIR was recommended by therapy team.    Hospital Course: [name] was admitted to rehab 03/30/2013 for inpatient therapies to consist of PT, ST and OT at least three hours five days a week. Past admission physiatrist, therapy team and rehab RN have worked together to provide customized collaborative inpatient rehab. She completed her IV antibiotics course and wound has shown good healing. Po intake has been poor and diet was liberalized to help with intake. SSI was used for BS control. She is to continue to hold lantus for now and follow up with PCP for input regarding resumption once BS start trending upwards. HD has been managed by renal service and has been ongoing on M, W, F schedule. Her prior FPBG wounds have been treated with santyl and damp to dry dressing changes daily and show healthy beefy red tissue with minimal yellow eschar. Mood has been stable and she has shown good motivation. She has progressed to modified Independent level and will continue to receive HHPT past discharge.    Rehab course: During patient's stay in rehab weekly team conferences were held to monitor patient's progress, set goals  and discuss barriers to discharge. Occupational therapy has focused strengthening of  UE as well as ADL tasks and endurance.  Physical therapy has focused on bed mobility, transfers, balance and endurance. Patient  Is independent for performing sliding board transfers. She is able to propel her wheelchair for >150 feet independently. She has had improvement in activity tolerance and endurance. She declined family eduction as is independent overall and has family  that can assist prn.    Disposition: 01-Home or Self Care  Diet: Renal diet.   Special Instructions: 1. [facility] for PT, OT, RN.  2. Check BS tid to qid and record.  3. Wash wound with soap and water.   Pat dry and apply compressive stocking.         Future Appointments Provider Department Dept Phone   05/02/2013 1:15 PM Chuck Hint, MD [facility] [phone]   05/07/2013 12:45 PM Beverely Pace [facility] [phone]   05/08/2013 10:00 AM Ranelle Oyster, MD [facility] [phone]   05/17/2013 2:30 PM [facility] Provider [facility] MED[facility] [phone]   05/29/2013 10:00 AM Waymon Budge, MD [facility] [phone]   10/10/2013 1:00 PM [facility] [phone]   10/10/2013 1:30 PM Chuck Hint, MD [facility] [phone]   10/30/2013 10:00 AM York Spaniel, MD [facility] [phone]       Medication List    STOP taking these medications       ibuprofen 600 MG tablet  Commonly known as:  ADVIL,MOTRIN      TAKE these medications       acetaminophen 325 MG tablet  Commonly known as:  TYLENOL  Take 650 mg by mouth every 6 (six) hours as needed for pain.     aspirin 325 MG tablet  Take 1 tablet (325 mg total) by mouth daily.     atorvastatin 10 MG tablet  Commonly known as:  LIPITOR  Take 10 mg by mouth daily before breakfast.     B-complex with vitamin C tablet  Take 1 tablet by mouth daily.     cinacalcet 60 MG tablet  Commonly known as:  SENSIPAR  Take 60 mg by mouth daily before breakfast.     collagenase ointment  Commonly known as:  SANTYL  Apply 1 application topically daily. To groin wound     DSS 100 MG Caps  Take 100 mg by mouth daily.     feeding supplement (NEPRO CARB STEADY)  Liqd  Take 237 mLs by mouth 2 (two) times daily between meals.     insulin aspart 100 UNIT/ML injection  Commonly known as:  novoLOG  Inject 3-16 Units into the skin 3 (three) times daily before meals. Sliding scale     midodrine 10 MG tablet  Commonly known as:  PROAMATINE  Take 10 mg by mouth 3 (three) times a week. Take on Monday, Wednesday, and Friday     multivitamin Tabs tablet  Take 1 tablet by mouth daily with supper.     oxyCODONE 5 MG immediate release tablet Rx--#45  Commonly known as:  Oxy IR/ROXICODONE  Take 1-2 tablets (5-10 mg total) by mouth every 8 (eight) hours as needed for pain.     sevelamer carbonate 800 MG tablet  Commonly known as:  RENVELA  Take 1 tablet (800 mg total) by  mouth 3 (three) times daily with meals. 3 tabs by mouth three times a day     SYSTANE BALANCE OP  Place 1 drop into both eyes as needed (dry eyes). Dry eyes     tetrahydrozoline-zinc 0.05-0.25 % ophthalmic solution  Commonly known as:  VISINE-AC  Place 2 drops into both eyes 3 (three) times daily as needed (for redness).       Follow-up Information   Follow up with Ranelle Oyster, MD On 05/08/2013. (Be there at 9:40 for 10 am appointment.)    Contact information:   [address], [address] [place] [address] [phone]       Follow up with Chuck Hint, MD On 04/27/2013. (appointment at 10:15 am.)    Contact information:   [address] [place] [address] [phone]       Signed: Jacquelynn Cree 04/13/2013, 5:53 PM

## 2013-04-13 NOTE — Progress Notes (Signed)
Social Work  Discharge Note  The overall goal for the admission was met for:   Discharge location: Yes - home to her own handicap accessible apartment  Length of Stay: Yes - 13 days  Discharge activity level: Yes - mod i w/c level  Home/community participation: Yes  Services provided included: MD, RD, PT, OT, RN, TR, Pharmacy and SW  Financial Services: Medicare  Follow-up services arranged: Home Health: RN, PT, OT via [facility] and DME: 18x18 lightwt. w/c with amputee axle plate, basic cushion, drop arm commode and 30" transfer board all via [facility]  Comments (or additional information):  Patient/Family verbalized understanding of follow-up arrangements: Yes  Individual responsible for coordination of the follow-up plan: patient  Confirmed correct DME delivered: Shanita Kanan 04/13/2013    Shavette Shoaff

## 2013-04-19 ENCOUNTER — Other Ambulatory Visit: Payer: Medicare Other | Admitting: Lab

## 2013-04-25 ENCOUNTER — Ambulatory Visit: Payer: Medicare Other | Admitting: Vascular Surgery

## 2013-04-26 ENCOUNTER — Other Ambulatory Visit: Payer: Medicare Other | Admitting: Lab

## 2013-04-26 ENCOUNTER — Telehealth: Payer: Self-pay

## 2013-04-26 NOTE — Telephone Encounter (Signed)
Trey Paula with [facility] called to see if patient could get a power wheel chair instead of a manual one.

## 2013-04-27 ENCOUNTER — Ambulatory Visit: Payer: Medicare Other | Admitting: Vascular Surgery

## 2013-04-27 NOTE — Telephone Encounter (Signed)
Notified Trey Paula of Dr Riley Kill reply.  Will change her appt 05/08/13 to reflect power wheel chair evaluation

## 2013-04-27 NOTE — Telephone Encounter (Signed)
i am ok with that, but she will need to be evaluated by me ("powered wheelchair visit") and go through the appropriate channels, which I assume they would facilitate.

## 2013-05-01 ENCOUNTER — Ambulatory Visit: Payer: Medicare Other | Admitting: Hematology and Oncology

## 2013-05-01 ENCOUNTER — Encounter: Payer: Self-pay | Admitting: Vascular Surgery

## 2013-05-01 ENCOUNTER — Ambulatory Visit: Payer: Medicare Other | Admitting: Oncology

## 2013-05-02 ENCOUNTER — Encounter: Payer: Self-pay | Admitting: Vascular Surgery

## 2013-05-02 ENCOUNTER — Ambulatory Visit (INDEPENDENT_AMBULATORY_CARE_PROVIDER_SITE_OTHER): Payer: Medicare Other | Admitting: Vascular Surgery

## 2013-05-02 VITALS — BP 123/64 | HR 97 | Temp 98.1°F | Ht 69.0 in | Wt 174.0 lb

## 2013-05-02 DIAGNOSIS — I739 Peripheral vascular disease, unspecified: Secondary | ICD-10-CM

## 2013-05-02 NOTE — Progress Notes (Signed)
Patient name: [name] MRN: [id number] DOB: 1956/07/31 Sex: female  REASON FOR VISIT: follow up after left above-the-knee amputation  HPI: [name] is a 57 y.o. female who had presented with a nonhealing wound of her left heel. She ultimately required a left above-the-knee amputation which was performed on 03/27/2013. She comes in today for staple removal. She has no specific complaints. She has had no drainage from her incision. She denies fever.  REVIEW OF SYSTEMS: Arly.Keller ] denotes positive finding; [  ] denotes negative finding  CARDIOVASCULAR:  [ ]  chest pain   [ ]  dyspnea on exertion    CONSTITUTIONAL:  [ ]  fever   [ ]  chills  PHYSICAL EXAM: Filed Vitals:   05/02/13 1320  BP: 123/64  Pulse: 97  Temp: 98.1 F (36.7 C)  TempSrc: Oral  Height: 5\' 9"  (1.753 m)  Weight: 174 lb (78.926 kg)  SpO2: 100%   Body mass index is 25.68 kg/(m^2). GENERAL: The patient is a well-nourished female, in no acute distress. The vital signs are documented above. CARDIOVASCULAR: There is a regular rate and rhythm  PULMONARY: There is good air exchange bilaterally without wheezing or rales. Her left above-the-knee amputation site is healing nicely.  MEDICAL ISSUES: Her left above-the-knee amputation site is healing nicely and her staples were removed in the office today. Overall this with her progress. I'll see her back as needed.  DICKSON,CHRISTOPHER S [facility] [place] Beeper: [phone]

## 2013-05-07 ENCOUNTER — Other Ambulatory Visit: Payer: Self-pay | Admitting: Lab

## 2013-05-07 ENCOUNTER — Other Ambulatory Visit: Payer: Self-pay | Admitting: *Deleted

## 2013-05-07 ENCOUNTER — Other Ambulatory Visit (HOSPITAL_BASED_OUTPATIENT_CLINIC_OR_DEPARTMENT_OTHER): Payer: Medicare Other

## 2013-05-07 DIAGNOSIS — D472 Monoclonal gammopathy: Secondary | ICD-10-CM

## 2013-05-07 LAB — CBC WITH DIFFERENTIAL/PLATELET
BASO%: 3.2 % — ABNORMAL HIGH (ref 0.0–2.0)
EOS%: 2.8 % (ref 0.0–7.0)
MCH: 27.3 pg (ref 25.1–34.0)
MCHC: 32.4 g/dL (ref 31.5–36.0)
RBC: 4.78 10*6/uL (ref 3.70–5.45)
RDW: 16.7 % — ABNORMAL HIGH (ref 11.2–14.5)
lymph#: 0.8 10*3/uL — ABNORMAL LOW (ref 0.9–3.3)

## 2013-05-07 LAB — BASIC METABOLIC PANEL (CC13)
CO2: 31 mEq/L — ABNORMAL HIGH (ref 22–29)
Calcium: 8.6 mg/dL (ref 8.4–10.4)
Creatinine: 2.5 mg/dL — ABNORMAL HIGH (ref 0.6–1.1)
Glucose: 120 mg/dl (ref 70–140)

## 2013-05-08 ENCOUNTER — Encounter: Payer: Medicare Other | Attending: Physical Medicine & Rehabilitation | Admitting: Physical Medicine & Rehabilitation

## 2013-05-08 ENCOUNTER — Encounter: Payer: Self-pay | Admitting: Physical Medicine & Rehabilitation

## 2013-05-08 VITALS — BP 116/76 | HR 88 | Resp 14 | Ht 69.0 in | Wt 174.0 lb

## 2013-05-08 DIAGNOSIS — E669 Obesity, unspecified: Secondary | ICD-10-CM | POA: Insufficient documentation

## 2013-05-08 DIAGNOSIS — N186 End stage renal disease: Secondary | ICD-10-CM

## 2013-05-08 DIAGNOSIS — G547 Phantom limb syndrome without pain: Secondary | ICD-10-CM | POA: Insufficient documentation

## 2013-05-08 DIAGNOSIS — S88119A Complete traumatic amputation at level between knee and ankle, unspecified lower leg, initial encounter: Secondary | ICD-10-CM

## 2013-05-08 DIAGNOSIS — S78119A Complete traumatic amputation at level between unspecified hip and knee, initial encounter: Secondary | ICD-10-CM | POA: Insufficient documentation

## 2013-05-08 DIAGNOSIS — I428 Other cardiomyopathies: Secondary | ICD-10-CM | POA: Insufficient documentation

## 2013-05-08 DIAGNOSIS — Z89511 Acquired absence of right leg below knee: Secondary | ICD-10-CM

## 2013-05-08 DIAGNOSIS — Z5189 Encounter for other specified aftercare: Secondary | ICD-10-CM

## 2013-05-08 DIAGNOSIS — I2589 Other forms of chronic ischemic heart disease: Secondary | ICD-10-CM

## 2013-05-08 DIAGNOSIS — I255 Ischemic cardiomyopathy: Secondary | ICD-10-CM

## 2013-05-08 NOTE — Patient Instructions (Signed)
CALL ME WITH ANY PROBLEMS OR QUESTIONS (#[phone]).  HAVE A GOOD DAY  

## 2013-05-08 NOTE — Progress Notes (Signed)
Subjective:    Patient ID: [name], female    DOB: [birth year], 57 y.o.   MRN: [id number]  HPI  [name] is here today after her left AKA. She has a history of a right BKA around 5 years ago. . She is here today primarily for a formal wheelchair evaluation. She has a right BK prosthesis which helps her with transfers. She has thought about a prosthetic left leg, but realizes that she cannot power a leg given all of her medical issues. Given her want to be independent in her home and the community, she is interested in pursuing a powered wheelchair. She is using   Her leg is healing nicely. She had the sutures removed by vascular surgery last week. She is still wearing her stump shrinker for edema control and protection.   She is still having mild phantom pain at this point, but it is controlled.    Pain Inventory Average Pain 4 Pain Right Now 1 My pain is dull and aching  In the last 24 hours, has pain interfered with the following? General activity 4 Relation with others 4 Enjoyment of life 3 What TIME of day is your pain at its worst? morning Sleep (in general) Poor  Pain is worse with: sitting and inactivity Pain improves with: medication Relief from Meds: 9  Mobility ability to climb steps?  no do you drive?  no use a wheelchair transfers alone  Function disabled: date disabled 2000 I need assistance with the following:  household duties and shopping  Neuro/Psych numbness tingling loss of taste or smell  Prior Studies Any changes since last visit?  no  Physicians involved in your care Any changes since last visit?  no   Family History  Problem Relation Age of Onset  . Heart disease    . Edema Father     fluid overload  . Cerebral aneurysm Mother   . Stroke Brother   . Heart disease Brother    History   Social History  . Marital Status: Single    Spouse Name: N/A    Number of Children: N/A  . Years of Education: N/A   Social History Main  Topics  . Smoking status: Never Smoker   . Smokeless tobacco: Never Used  . Alcohol Use: No  . Drug Use: No  . Sexual Activity: None   Other Topics Concern  . None   Social History Narrative  . None   Past Surgical History  Procedure Laterality Date  . Coronary artery bypass graft  2011    3V  . Insertion of dialysis catheter    . 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 Nasal mucosal biopsy    . Ovarian cyst surgery    . 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 Laser retinal surgery  2011  . Kidney transplant      rejection  . Abdominal angiogram Left 01/25/13  . Colonoscopy    . Cardiac catheterization    . Eye surgery    . Femoral-popliteal bypass graft Left 02/20/2013    Procedure:  FEMORAL-to below knee POPLITEAL ARTERY bypasswith saphenous vein with intraoperative angiogram ;  Surgeon: Chuck Hint, MD;  Location: [facility] OR;  Service: Vascular;  Laterality: Left;  fem-below knee pop  . Amputation Left 03/27/2013    Procedure: AMPUTATION ABOVE KNEE;  Surgeon: Chuck Hint, MD;  Location: [facility] OR;  Service: Vascular;  Laterality:  Left;   Past Medical History  Diagnosis Date  . Diabetes mellitus, type 2   . Secondary hyperparathyroidism   . OSA (obstructive sleep apnea)     NPSG 11.23.11-AHI 64.9/hr-central and obstructive apnea  . Ischemic cardiomyopathy     EF 25-30% 2011  . CAD (coronary artery disease)   . Pulmonary hypertension     Est PAsyst 60-Dr Mendel Ryder, off coumadin, CT neg for PE  . Hyperlipidemia   . Anemia   . Thyroid disease   . Pulmonary edema   . Peripheral vascular disease   . Sebaceous cyst   . 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 Velvet Bathe  . Irregular heart beat   . Shortness of breath     with exertion  . Hemodialysis patient     M,W,F  . TIA (transient ischemic attack) June 2014  . Ulcer of heel due to diabetes     left  . Kidney disease Dialysis MWF    ESRD  secondary to DM, failed transplant 2006. Dialysis   BP 116/76  Pulse 88  Resp 14  Ht 5\' 9"  (1.753 m)  Wt 174 lb (78.926 kg)  BMI 25.68 kg/m2  SpO2 100%     Review of Systems  Constitutional: Positive for appetite change.  Neurological: Positive for numbness.  All other systems reviewed and are negative.       Objective:   Physical Exam  General: Alert and oriented x 3, No apparent distress. Morbidly obese HEENT: Head is normocephalic, atraumatic, PERRLA, EOMI, sclera anicteric, oral mucosa pink and moist, dentition intact, ext ear canals clear,  Neck: Supple without JVD or lymphadenopathy Heart: Reg rate and rhythm. No murmurs rubs or gallops Chest: CTA bilaterally without wheezes, rales, or rhonchi; no distress Abdomen: Soft, non-tender, non-distended, bowel sounds positive. Extremities: No clubbing, cyanosis, or edema. Pulses are 2+ Skin: Clean and intact without signs of breakdown. Wound has completely closed with only dry skin and scar remaining. There were also a few absorbable sutures still available.  Neuro: Pt is cognitively appropriate with normal insight, memory, and awareness. Cranial nerves 2-12 are intact. Sensory exam is normal. Reflexes are 2+ in all 4's. Fine motor coordination is intact. No tremors. Motor function is grossly 5/5 in the UE's and in the LE's except for the left HF,HE which are 4  Musculoskeletal: Full ROM, No pain with AROM or PROM in the neck, trunk, or extremities. Posture appropriate. Left AK is well shaped, minimally tender.  Psych: Pt's affect is appropriate. Pt is cooperative         Assessment & Plan:  1. Functional deficits secondary to left AKA--03/27/13 (prior right BKA w/ pros) 2. ESRD 3. Cardiomyopathy 4. Obesity  Plan: 1. Candace. Gopal is appropriate for a powered wheelchair due to the fact that she is unable to use a cane or a walker due to her double amputations. She is limited in propelling a manual chair given her size and  cardiac limitations.  She is not appropriate for a scooter given her bilateral amputations (in particular the left AKA) which affect her balance, trunk control, and ability to sit and transfer on to a scooter. A powered wheelchair will allow Candace. Mottola to be independent with her self-care, home/community mobility, and movement to and from hemodialysis. Candace. Fontanilla is competent to safely operate a powered wheelchair as well.   2. Follow up with me PRN. I provided her contact info in regards to wheelchair companies.

## 2013-05-09 LAB — KAPPA/LAMBDA LIGHT CHAINS
Kappa free light chain: 56.9 mg/dL — ABNORMAL HIGH (ref 0.33–1.94)
Kappa:Lambda Ratio: 3.21 — ABNORMAL HIGH (ref 0.26–1.65)
Lambda Free Lght Chn: 17.7 mg/dL — ABNORMAL HIGH (ref 0.57–2.63)

## 2013-05-09 LAB — SPEP & IFE WITH QIG
Albumin ELP: 47.3 % — ABNORMAL LOW (ref 55.8–66.1)
Beta 2: 3.8 % (ref 3.2–6.5)
IgA: 159 mg/dL (ref 69–380)
IgM, Serum: 38 mg/dL — ABNORMAL LOW (ref 52–322)
Total Protein, Serum Electrophoresis: 6.9 g/dL (ref 6.0–8.3)

## 2013-05-09 LAB — IMMUNOFIXATION ELECTROPHORESIS: IgM, Serum: 38 mg/dL — ABNORMAL LOW (ref 52–322)

## 2013-05-14 ENCOUNTER — Ambulatory Visit: Payer: Self-pay | Admitting: Oncology

## 2013-05-16 ENCOUNTER — Telehealth: Payer: Self-pay

## 2013-05-16 ENCOUNTER — Ambulatory Visit: Payer: Medicare Other | Admitting: Vascular Surgery

## 2013-05-16 NOTE — Telephone Encounter (Signed)
i am willing to do whatever she would like but given her medical issues and right bka, she is much likely to benefit from the powered chair than an AK prosthesis. She has to remember that once she decides upon one, mcr/insurance will not pay for the other. Who built the first leg she owns?

## 2013-05-16 NOTE — Telephone Encounter (Signed)
Patient called and wants to know if you would consider ordering a prosthetic leg instead of the wheelchair like discussed during previous office visit. She said she thinks the prosthetic would be a better choice than the wheelchair.

## 2013-05-17 ENCOUNTER — Telehealth: Payer: Self-pay | Admitting: Internal Medicine

## 2013-05-17 ENCOUNTER — Encounter: Payer: Self-pay | Admitting: Internal Medicine

## 2013-05-17 NOTE — Progress Notes (Signed)
This encounter was created in error - please disregard.

## 2013-05-17 NOTE — Telephone Encounter (Signed)
returned pt call and r/s misses appt to next thursday.Candace KitchenMarland KitchenMarland Rocha

## 2013-05-17 NOTE — Telephone Encounter (Signed)
i have contacted [facility] and they will contact [name] regarding proceeding with a leg.

## 2013-05-17 NOTE — Telephone Encounter (Signed)
Left patient a voicemail to call the clinic back.

## 2013-05-17 NOTE — Progress Notes (Deleted)
Hematology and Oncology Follow Up Visit  [name] [id number] [birth year] 57 y.o. 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   Prior Therapy: None  Current therapy:  None  Interim History:  ***  Medications: {medication reviewed/display:3041432}  Current Outpatient Prescriptions  Medication Sig Dispense Refill  . acetaminophen (TYLENOL) 325 MG tablet Take 650 mg by mouth every 6 (six) hours as needed for pain.       [name] aspirin 325 MG tablet Take 1 tablet (325 mg total) by mouth daily.      [name] atorvastatin (LIPITOR) 10 MG tablet Take 10 mg by mouth daily before breakfast.       . B Complex-C (B-COMPLEX WITH VITAMIN C) tablet Take 1 tablet by mouth daily.      . cinacalcet (SENSIPAR) 60 MG tablet Take 60 mg by mouth daily before breakfast.       . docusate sodium 100 MG CAPS Take 100 mg by mouth daily.  10 capsule  0  . insulin aspart (NOVOLOG) 100 UNIT/ML injection Inject 3-16 Units into the skin 3 (three) times daily before meals. Sliding scale      . midodrine (PROAMATINE) 10 MG tablet Take 10 mg by mouth 3 (three) times a week. Take on Monday, Wednesday, and Friday      . multivitamin (RENA-VIT) TABS tablet Take 1 tablet by mouth daily with supper.    0  . oxyCODONE (OXY IR/ROXICODONE) 5 MG immediate release tablet Take 1-2 tablets (5-10 mg total) by mouth every 8 (eight) hours as needed for pain.  45 tablet  0  . Polyethyl Glycol-Propyl Glycol (SYSTANE OP) Apply to eye.      [name] Propylene Glycol (SYSTANE BALANCE OP) Place 1 drop into both eyes as needed (dry eyes). Dry eyes      . sevelamer carbonate (RENVELA) 800 MG tablet Take 1 tablet (800 mg total) by mouth 3 (three) times daily with meals. 3 tabs by mouth three times a day       No current facility-administered medications for this visit.     Allergies:  Allergies  Allergen Reactions  . Neurontin [Gabapentin]  Anaphylaxis  . Ezetimibe-Simvastatin Other (See Comments)    REACTION: face edema; itching  . Klonopin [Clonazepam] Other (See Comments)    hallucination  . Sertraline Hcl Other (See Comments)    hallucination    Past Medical History, Surgical history, Social history, and Family History were reviewed and updated.  Review of Systems: Constitutional:  Negative for fever, chills, night sweats, anorexia, weight loss, pain. Cardiovascular: {roscv:310661} Respiratory: {ros resp:310659} Neurological: {rosneuro:310663} Dermatological: {ros skin:310673} ENT: {rosent:310657} Skin: Negative. Gastrointestinal: {ros gi:310669} Genito-Urinary: {rosgu:310671} Hematological and Lymphatic: {rosheme/lymph:310665} Breast: {ros breast:311036} Musculoskeletal: {ros musculoskeletal:310677} Remaining ROS negative. Physical Exam: There were no vitals taken for this visit. ECOG:  General appearance: {general exam:16600} Head: {head exam:30909::"Normocephalic, without obvious abnormality","atraumatic"} Neck: {neck exam:17463::"no adenopathy","no carotid bruit","no JVD","supple, symmetrical, trachea midline","thyroid not enlarged, symmetric, no tenderness/mass/nodules"} Lymph nodes: {lymph node exam:14039::"Cervical, supraclavicular, and axillary nodes normal."} Heart:{Exam; heart:5510} Lung:{Exam; lungs:5033::"Heart exam - S1, S2 normal, no murmur, no gallop, rate regular"} Abdomin: {Exam; abdomen:14935::"soft, non-tender, without masses or organomegaly"} EXT:{EXTREMITIES NEGATIVES KVQQ:59563}   Lab Results: Lab Results  Component Value Date   WBC 4.4 05/07/2013   HGB 13.1 05/07/2013   HCT 40.3 05/07/2013   MCV 84.2 05/07/2013   PLT 128* 05/07/2013     Chemistry  Component Value Date/Time   NA 140 05/07/2013 1202   NA 128* 04/11/2013 1439   K 3.3* 05/07/2013 1202   K 4.5 04/11/2013 1439   CL 94* 04/11/2013 1439   CL 98 07/11/2012 1427   CO2 31* 05/07/2013 1202   CO2 25 04/11/2013 1439   BUN  11.9 05/07/2013 1202   BUN 19 04/11/2013 1439   CREATININE 2.5* 05/07/2013 1202   CREATININE 4.65* 04/11/2013 1439      Component Value Date/Time   CALCIUM 8.6 05/07/2013 1202   CALCIUM 8.6 04/11/2013 1439   ALKPHOS 99 04/03/2013 0135   ALKPHOS 106 07/11/2012 1427   AST 25 04/03/2013 0135   AST 17 07/11/2012 1427   ALT 13 04/03/2013 0135   ALT 15 07/11/2012 1427   BILITOT 0.7 04/03/2013 0135   BILITOT 0.78 07/11/2012 1427       Radiological Studies: No results found.   Impression and Plan: ***  Spent more than half the time coordinating care.    Pranit Owensby, MD 9/4/20148:59 AM   CC: Deirdre Peer. Polite, M.D.  Marlan Palau, M.D.  James L. Deterding, M.D.  IDENTIFYING STATEMENT: The patient is a 57 year old woman who presents  to discuss results.  INTERVAL HISTORY: To summarize, the patient was referred from Neurology  when she was found to have a monoclonal gamma protein during lab work.  She had presented with a 3-4 month history of numbness in left foot.  She also has type 2 diabetes, end-stage renal disease for which she  receives dialysis 3 days a week.  She is here to discuss lab results which note the following: On  07/18/2012 hemoglobin and hematocrit 12.4 and 37.9 respectively;  platelets 109; IgG 3130, IgA 200, IgM 27; total protein 7.6; kappa free  light chain 61.4. Lambda free light chain 17.7. Kappa lambda ratio  3.47. M spike 1.63 g/dL. Bone survey on 07/25/2012 showed no evidence  of blastic or lytic lesions.  MEDICATIONS: Reviewed and updated.  ALLERGIES: Updated.  PAST MEDICAL HISTORY/FAMILY HISTORY/SOCIAL HISTORY: Unchanged.  REVIEW OF SYSTEMS: Ten-point review of systems was negative.  PHYSICAL EXAM: General: The patient is a well-appearing, well-  nourished woman in no distress. Vitals: Pulse 105, blood pressure  151/76, temperature 98.2, respirations 20, weight 223 pounds. HEENT:  Head is atraumatic, normocephalic. Sclerae anicteric. Mouth moist.   Chest/CVS: Unremarkable. Abdomen: Soft. Extremities: Trace edema.  LABORATORY DATA: As above. In addition, a bone marrow biopsy with  aspiration on 07/18/2012 showed a slightly hypercellular bone marrow for  age with trilineage hematopoiesis. Plasma cells totals 9%. Ring  sideroblasts were absent. Storage iron was increased. Megakaryocytes  were abundant with predominantly normal morphology.  Sodium 140, potassium 3.6, chloride 98, CO2 32, BUN 25, creatinine 5.9  (the patient is on dialysis), glucose 149. T bili 0.78, alkaline phos  is 106, AST 17, ALT 15, calcium 9.4.  IMPRESSION AND PLAN: Ms. Federici is a 57 year old woman with:  1. Immunoglobulin G monoclonal gammopathy of undetermined  significance. She has renal failure for which she is on dialysis  but this is felt to be secondary to a history of hypertension. She  is also status post failed cadaveric renal transplant.  2. Peripheral vascular disease.  3. Peripheral neuropathy.  4. Hyperlipidemia.  We spent time discussing monoclonal gamma protein of undetermined  significance specifically the IgG subtype. The patient was told that  the risk was relatively benign. There is a 1% chance of this  transferring to myeloma thus annual  followup from hematology standpoint  is required. She thus follows up in 9 months' time. She is to continue  her other medical care with her other physicians.

## 2013-05-17 NOTE — Telephone Encounter (Signed)
Patient was informed of the following:::  i am willing to do whatever she would like but given her medical issues and right bka, she is much likely to benefit from the powered chair than an AK prosthesis. She has to remember that once she decides upon one, mcr/insurance will not pay for the other. Who built the first leg she owns?  Patient states that she understands and is aware that insurance will not pay for both and wants the prosthetic leg. She used [employer] for her first leg, but [facility] came to the hospital and she prefers to use them for this prosthetic.

## 2013-05-24 ENCOUNTER — Ambulatory Visit (HOSPITAL_BASED_OUTPATIENT_CLINIC_OR_DEPARTMENT_OTHER): Payer: Medicare Other | Admitting: Internal Medicine

## 2013-05-24 ENCOUNTER — Telehealth: Payer: Self-pay | Admitting: Internal Medicine

## 2013-05-24 VITALS — BP 98/74 | HR 134 | Temp 98.1°F | Resp 18 | Ht 69.0 in

## 2013-05-24 DIAGNOSIS — D631 Anemia in chronic kidney disease: Secondary | ICD-10-CM

## 2013-05-24 DIAGNOSIS — S88119A Complete traumatic amputation at level between knee and ankle, unspecified lower leg, initial encounter: Secondary | ICD-10-CM

## 2013-05-24 DIAGNOSIS — D472 Monoclonal gammopathy: Secondary | ICD-10-CM

## 2013-05-24 DIAGNOSIS — Z89511 Acquired absence of right leg below knee: Secondary | ICD-10-CM

## 2013-05-24 DIAGNOSIS — N189 Chronic kidney disease, unspecified: Secondary | ICD-10-CM

## 2013-05-24 NOTE — Telephone Encounter (Signed)
Gave pt appt for lab and MD in 9 months

## 2013-05-27 ENCOUNTER — Encounter: Payer: Self-pay | Admitting: Internal Medicine

## 2013-05-27 NOTE — Progress Notes (Signed)
Hematology and Oncology Follow Up Visit  [name] [id number] 04-21-1956 57 y.o. 05/24/2013  6:30pm DETERDING,JAMES L, MD  Principle Diagnosis: MGUS  Prior Therapy:  None  Current therapy: None  Interim History:  [name] was last seen 08/08/2012 by Dr. Laurice Record, MD. She also has type 2 diabetes, end-stage renal disease for which she receives dialysis 3 days a week. Her lab results which note the following:  On 07/18/2012 hemoglobin and hematocrit 12.4 and 37.9 respectively; platelets 109; IgG 3130, IgA 200, IgM 27; total protein 7.6; kappa free light chain 61.4.  Lambda free light chain 17.7.  Kappa lambda ratio 3.47.  M spike 1.63 g/dL.  Bone survey on 07/25/2012 showed no evidenceof blastic or lytic lesions.  She had a left BKA due to wound dehescience and bacteremia on 03/2013.     Medications: I have reviewed the patient's current medications.  Current Outpatient Prescriptions  Medication Sig Dispense Refill  . acetaminophen (TYLENOL) 325 MG tablet Take 650 mg by mouth every 6 (six) hours as needed for pain.       [name] aspirin 325 MG tablet Take 1 tablet (325 mg total) by mouth daily.      [name] atorvastatin (LIPITOR) 10 MG tablet Take 10 mg by mouth daily before breakfast.       . B Complex-C (B-COMPLEX WITH VITAMIN C) tablet Take 1 tablet by mouth daily.      . cinacalcet (SENSIPAR) 60 MG tablet Take 60 mg by mouth daily as needed.       . docusate sodium 100 MG CAPS Take 100 mg by mouth daily.  10 capsule  0  . insulin aspart (NOVOLOG) 100 UNIT/ML injection Inject 3-16 Units into the skin 3 (three) times daily before meals. Sliding scale      . midodrine (PROAMATINE) 10 MG tablet Take 10 mg by mouth 3 (three) times a week. Take on Monday, Wednesday, and Friday      . multivitamin (RENA-VIT) TABS tablet Take 1 tablet by mouth daily with supper.    0  . oxyCODONE (OXY IR/ROXICODONE) 5 MG immediate release tablet Take 1-2 tablets (5-10 mg total) by mouth every 8 (eight)  hours as needed for pain.  45 tablet  0  . Polyethyl Glycol-Propyl Glycol (SYSTANE OP) Apply to eye.      . pregabalin (LYRICA) 25 MG capsule Take 25 mg by mouth 2 (two) times daily.      [name] Propylene Glycol (SYSTANE BALANCE OP) Place 1 drop into both eyes as needed (dry eyes). Dry eyes      . sevelamer carbonate (RENVELA) 800 MG tablet Take 1 tablet (800 mg total) by mouth 3 (three) times daily with meals. 3 tabs by mouth three times a day       No current facility-administered medications for this visit.   Allergies:  Allergies  Allergen Reactions  . Neurontin [Gabapentin] Anaphylaxis  . Ezetimibe-Simvastatin Other (See Comments)    REACTION: face edema; itching  . Klonopin [Clonazepam] Other (See Comments)    hallucination  . Sertraline Hcl Other (See Comments)    hallucination    Past Medical History, Surgical history, Social history, and Family History were reviewed and updated.  Review of Systems: Constitutional:  Negative for fever, chills, night sweats, anorexia, weight loss, pain. Cardiovascular: no chest pain or dyspnea on exertion Respiratory: no cough, shortness of breath, or wheezing Neurological: no TIA or stroke symptoms Dermatological: negative for rash ENT: negative for - epistaxis Skin:  Negative. Gastrointestinal: no abdominal pain, change in bowel habits, or black or bloody stools Genito-Urinary: no dysuria, trouble voiding, or hematuria  On dialysis Hematological and Lymphatic: negative for - blood clots Breast: negative for breast lumps Musculoskeletal: negative for - joint stiffness Remaining ROS negative. Physical Exam: Blood pressure 98/74, pulse 134, temperature 98.1 F (36.7 C), temperature source Oral, resp. rate 18, height 5\' 9"  (1.753 m), weight 0 lb (0 kg), SpO2 98.00%. ECOG:  2 General appearance: alert, cooperative, appears stated age and no distress  She has bilateral BKA; sitting in wheelchair Head: Normocephalic, without obvious abnormality,  atraumatic Neck: no adenopathy, supple, symmetrical, trachea midline and thyroid not enlarged, symmetric, no tenderness/mass/nodules Lymph nodes: Cervical adenopathy: None Heart:regular rate and rhythm, S1, S2 normal, no murmur, click, rub or gallop Lung:chest clear, no wheezing, rales, normal symmetric air entry, Heart exam - S1, S2 normal, no murmur, no gallop, rate regular Abdomin: S/NT/ND + BS EXT: Bilateral BKA with prothesis on the right.    Lab Results: Lab Results  Component Value Date   WBC 4.4 05/07/2013   HGB 13.1 05/07/2013   HCT 40.3 05/07/2013   MCV 84.2 05/07/2013   PLT 128* 05/07/2013     Chemistry      Component Value Date/Time   NA 140 05/07/2013 1202   NA 128* 04/11/2013 1439   K 3.3* 05/07/2013 1202   K 4.5 04/11/2013 1439   CL 94* 04/11/2013 1439   CL 98 07/11/2012 1427   CO2 31* 05/07/2013 1202   CO2 25 04/11/2013 1439   BUN 11.9 05/07/2013 1202   BUN 19 04/11/2013 1439   CREATININE 2.5* 05/07/2013 1202   CREATININE 4.65* 04/11/2013 1439      Component Value Date/Time   CALCIUM 8.6 05/07/2013 1202   CALCIUM 8.6 04/11/2013 1439   ALKPHOS 99 04/03/2013 0135   ALKPHOS 106 07/11/2012 1427   AST 25 04/03/2013 0135   AST 17 07/11/2012 1427   ALT 13 04/03/2013 0135   ALT 15 07/11/2012 1427   BILITOT 0.7 04/03/2013 0135   BILITOT 0.78 07/11/2012 1427      Results for Rocha, Candace (MRN [id number]) as of 05/27/2013 18:37  Ref. Range 05/07/2013 12:02 05/07/2013 12:02  IgG (Immunoglobin G), Serum Latest Range: [phone] mg/dL 7846 (H) 9629 (H)  IgA Latest Range: 69-380 mg/dL 528 413  IgM, Serum Latest Range: 52-322 mg/dL 38 (L) 38 (L)  Total Protein, Serum Electrophoresis Latest Range: 6.0-8.3 g/dL 6.9 6.9  Kappa free light chain Latest Range: 0.33-1.94 mg/dL 24.40 (H)   Lambda Free Lght Chn Latest Range: 0.57-2.63 mg/dL 10.27 (H)   Kappa:Lambda Ratio Latest Range: 0.26-1.65  3.21 (H)    Radiological Studies: No results found.   Impression and Plan: 1. IgG MGUS. --  There is a 1% chance of this transferring to myeloma per year and therefore annual follow up is preferred. -- RTC in 9 months with CBC, CMP, SPEP and Kappa/Lambda   Spent more than half the time coordinating care.    Jeanclaude Wentworth, MD 05/24/2013  6:30pm

## 2013-05-29 ENCOUNTER — Ambulatory Visit (INDEPENDENT_AMBULATORY_CARE_PROVIDER_SITE_OTHER): Payer: Medicare Other | Admitting: Internal Medicine

## 2013-05-29 ENCOUNTER — Encounter: Payer: Self-pay | Admitting: Internal Medicine

## 2013-05-29 VITALS — BP 118/62 | HR 125 | Ht 69.0 in | Wt 176.4 lb

## 2013-05-29 DIAGNOSIS — G4700 Insomnia, unspecified: Secondary | ICD-10-CM

## 2013-05-29 DIAGNOSIS — G4733 Obstructive sleep apnea (adult) (pediatric): Secondary | ICD-10-CM

## 2013-05-29 MED ORDER — ZOLPIDEM TARTRATE 5 MG PO TABS: 5.0000 mg | ORAL_TABLET | Freq: Every evening | ORAL | Status: DC | PRN

## 2013-05-29 NOTE — Patient Instructions (Addendum)
Script for [employer] 5 mg to take one if needed at bedtime for sleep  Order- [facility]- replacement CPAP mask of choice- try full face mask    Dx OSA  Our goal is to use CPAP all night, every night. If it isn't comfortable, let us know so we can try to change it.

## 2013-05-29 NOTE — Progress Notes (Signed)
Subjective:    Patient ID: [name], female    DOB: Jan 07, 1956, 57 y.o.   MRN: [id number]  HPI 05/27/11- 13 yo F never smoker followed for OSA/ CSA, complicated by ESRD/ dialysis Last here November 12, 2010 [id number]- feels "used to it". Continues comfortable with it all night every night. Does take it off sometimes. Has too many antibodies for another try at renal transplant.  Has had flu shot. Denies any routine breathing concerns. Has already had flu shot.  05/26/12- 57 yo F never smoker followed for OSA/ CSA, complicated by ESRD/ dialysis, DM, HBP, SLE Wears CPAP 12/[facility] every night for approximately 4-6 hours; pressure doing well; nasal pillows. Got flu vaccine at dialysis.  05/29/13- 57 yo F never smoker followed for OSA/ CSA, complicated by ESRD/ dialysis, DM, HBP, SLE FOLLOWS FOR: not sleeping well; not using CPAP 12/ [facility]  every night-no particular reason She had her other leg amputated in July for  chronic arterial disease. Has not slept well since-difficulty initiating and maintaining sleep. She is not able to recognize symptoms specifically associated with sleep apnea since she stopped CPAP.  Review of Systems-see HPI Constitutional:   No-   weight loss, night sweats, fevers, chills, fatigue, lassitude. HEENT:   No-  headaches, difficulty swallowing, tooth/dental problems, sore throat,       No-  sneezing, itching, ear ache, nasal congestion, post nasal drip,  CV:  No-   chest pain, orthopnea, PND, swelling in lower extremities, anasarca, dizziness, palpitations Resp: No-   shortness of breath with exertion or at rest.              No-   productive cough,  No non-productive cough,  No-  coughing up of blood.              No-   change in color of mucus.  No- wheezing.   Skin: No-   rash or lesions. GI:  No-   heartburn, indigestion, abdominal pain, nausea, vomiting,  GU:  MS:  No-   joint pain or swelling. +Bilateral LE amputation. . Neuro- nothing unusual  Psych:   No- change in mood or affect. No depression or anxiety.  No memory loss.  Objective:   Physical Exam  General- Alert, Oriented, Affect-appropriate, Distress- none acute Skin- rash-none, lesions- none, excoriation- none Lymphadenopathy- none Head- atraumatic            Eyes- Gross vision intact, PERRLA, conjunctivae clear secretions            Ears- Hearing, canals normal            Nose- Clear, no-Septal dev, mucus, polyps, erosion, perforation             Throat- Mallampati II-III , mucosa clear , drainage- none, tonsils- atrophic Neck- flexible , trachea midline, no stridor , thyroid nl, carotid no bruit Chest - symmetrical excursion , unlabored           Heart/CV- RRR , no murmur , no gallop  , no rub, nl s1 s2. + bruit right upper anterior chest                           - JVD- none , edema- none, stasis changes- none, varices- none           Lung- clear to P&A, wheeze- none, cough- none , dullness-none, rub- none           Chest  wall- +sternotomy  scar Abd-  Br/ Gen/ Rectal- Not done, not indicated Extrem- +R BKA/ prosthesis/, +L AKA;  access scars. +Dialysis Access right upper extremity Neuro- grossly intact to observation Assessment & Plan:

## 2013-06-07 DIAGNOSIS — G4700 Insomnia, unspecified: Secondary | ICD-10-CM | POA: Insufficient documentation

## 2013-06-07 NOTE — Assessment & Plan Note (Signed)
Encouraged her to get back on CPAP regularly. I think this will help her sleep. We will contact her DME for a refit of her mask to try a full-face mask.

## 2013-06-07 NOTE — Assessment & Plan Note (Signed)
Not clear if this is a separate problem from her sleep apnea, noting onset after her second lower leg amputation. If we get her back on CPAP it will be helpful to notice if her insomnia complaint improves. Plan-try Ambien 5 mg with discussion as

## 2013-06-12 ENCOUNTER — Telehealth: Payer: Self-pay | Admitting: Physical Medicine & Rehabilitation

## 2013-06-12 DIAGNOSIS — I2589 Other forms of chronic ischemic heart disease: Secondary | ICD-10-CM

## 2013-06-12 DIAGNOSIS — N186 End stage renal disease: Secondary | ICD-10-CM

## 2013-06-12 DIAGNOSIS — Z5189 Encounter for other specified aftercare: Secondary | ICD-10-CM

## 2013-06-12 DIAGNOSIS — S88119A Complete traumatic amputation at level between knee and ankle, unspecified lower leg, initial encounter: Secondary | ICD-10-CM

## 2013-06-12 NOTE — Telephone Encounter (Signed)
Angie with [facility] called to let us know [facility] wants patient to go to outpatient therapy.  She will need an order for this in Epic.

## 2013-06-13 NOTE — Telephone Encounter (Signed)
Physical therapy placed.

## 2013-06-14 ENCOUNTER — Ambulatory Visit: Payer: Medicare Other | Attending: Physical Medicine & Rehabilitation | Admitting: Physical Therapy

## 2013-06-14 DIAGNOSIS — M6281 Muscle weakness (generalized): Secondary | ICD-10-CM | POA: Insufficient documentation

## 2013-06-14 DIAGNOSIS — R5381 Other malaise: Secondary | ICD-10-CM | POA: Insufficient documentation

## 2013-06-14 DIAGNOSIS — S78119A Complete traumatic amputation at level between unspecified hip and knee, initial encounter: Secondary | ICD-10-CM | POA: Insufficient documentation

## 2013-06-14 DIAGNOSIS — Z5189 Encounter for other specified aftercare: Secondary | ICD-10-CM | POA: Insufficient documentation

## 2013-06-14 DIAGNOSIS — R269 Unspecified abnormalities of gait and mobility: Secondary | ICD-10-CM | POA: Insufficient documentation

## 2013-06-19 ENCOUNTER — Ambulatory Visit: Payer: Medicare Other | Admitting: Physical Therapy

## 2013-06-26 ENCOUNTER — Ambulatory Visit: Payer: Medicare Other | Admitting: Physical Therapy

## 2013-06-28 ENCOUNTER — Ambulatory Visit: Payer: Medicare Other | Admitting: Physical Therapy

## 2013-06-29 ENCOUNTER — Ambulatory Visit (INDEPENDENT_AMBULATORY_CARE_PROVIDER_SITE_OTHER): Payer: Medicare Other | Admitting: Family

## 2013-06-29 ENCOUNTER — Encounter: Payer: Self-pay | Admitting: Family

## 2013-06-29 VITALS — BP 87/56 | HR 95 | Resp 16 | Ht 69.0 in | Wt 178.6 lb

## 2013-06-29 DIAGNOSIS — I739 Peripheral vascular disease, unspecified: Secondary | ICD-10-CM

## 2013-06-29 NOTE — Progress Notes (Signed)
[facility] [place]  Postoperative Visit - Amputation   Subjective [name] is a 57 y.o. female who has right BKA and left AKA.  2 days ago she developed a large blister over the medial aspect of her right BKA. This "popped this am and pt was concerned re: red area under blister skin. Pt.denies  pain in the stump except where blister popped.  Physical Examination  BP Readings from Last 3 Encounters:  06/29/13 87/56  05/29/13 118/62  05/24/13 98/74   Temp Readings from Last 3 Encounters:  05/24/13 98.1 F (36.7 C) Oral  05/02/13 98.1 F (36.7 C) Oral  04/13/13 98.4 F (36.9 C) Oral   SpO2 Readings from Last 3 Encounters:  05/29/13 98%  05/24/13 98%  05/08/13 100%   Pulse Readings from Last 3 Encounters:  06/29/13 95  05/29/13 125  05/24/13 134    Pt is A&Ox3  WDWN female with no complaints  Right amputation wound is has a large area on medial aspect mostly covered with blister skin. the areawhere the skin broke is raw under this area and healing well.  There is good bone coverage in the stump Stump is warm and well perfused, without drainage; without erythema   Assessment/plan:  TAKARA SERMONS is a 57 y.o. female who is s/p Right BKA. Pt developed blister under prothesis sock which popped today. The wound is clean and should heal   The patient's stump is viable.  Follow-up 4 weeks to recheck wound  Hold off on using prothesis until this blister is healed  Syed Zukas J 2:18 PM 06/29/2013

## 2013-07-03 ENCOUNTER — Ambulatory Visit: Payer: Medicare Other | Admitting: Physical Therapy

## 2013-07-05 ENCOUNTER — Ambulatory Visit: Payer: Medicare Other | Admitting: Physical Therapy

## 2013-07-10 ENCOUNTER — Ambulatory Visit: Payer: Medicare Other | Admitting: Physical Therapy

## 2013-07-12 ENCOUNTER — Encounter: Payer: Medicare Other | Admitting: Physical Therapy

## 2013-07-17 ENCOUNTER — Encounter: Payer: Medicare Other | Admitting: Physical Therapy

## 2013-07-17 ENCOUNTER — Encounter: Payer: Self-pay | Admitting: Vascular Surgery

## 2013-07-18 ENCOUNTER — Encounter: Payer: Self-pay | Admitting: Vascular Surgery

## 2013-07-18 ENCOUNTER — Ambulatory Visit (INDEPENDENT_AMBULATORY_CARE_PROVIDER_SITE_OTHER): Payer: Medicare Other | Admitting: Vascular Surgery

## 2013-07-18 VITALS — BP 91/62 | HR 67 | Temp 98.1°F | Resp 16 | Ht 69.0 in | Wt 181.9 lb

## 2013-07-18 DIAGNOSIS — I739 Peripheral vascular disease, unspecified: Secondary | ICD-10-CM

## 2013-07-18 NOTE — Progress Notes (Signed)
Patient name: [name] MRN: [id number] DOB: 20-Aug-1956 Sex: female  REASON FOR VISIT: follow up of left above-the-knee amputation  HPI: [name] is a 57 y.o. female who presented with a nonhealing wound of her left heel. She required a left above-the-knee amputation which was performed on 03/27/2013. She was last seen in our office on 05/02/2013 at which time her amputation site was healing well and her staples were removed.  She has a prosthesis for both lower extremities. She developed a blister on her old right BKA from her prosthesis. The blister subsequently broke and she is left with a large raw surface on her right BKA stump. He denies fever or chills.  REVIEW OF SYSTEMS: Arly.Keller ] denotes positive finding; [  ] denotes negative finding  CARDIOVASCULAR:  [ ]  chest pain   [ ]  dyspnea on exertion    CONSTITUTIONAL:  [ ]  fever   [ ]  chills  PHYSICAL EXAM: Filed Vitals:   07/18/13 1318  BP: 91/62  Pulse: 67  Temp: 98.1 F (36.7 C)  Resp: 16  Height: 5\' 9"  (1.753 m)  Weight: 181 lb 14.1 oz (82.5 kg)   Body mass index is 26.85 kg/(m^2). GENERAL: The patient is a well-nourished female, in no acute distress. The vital signs are documented above. CARDIOVASCULAR: There is a regular rate and rhythm  PULMONARY: There is good air exchange bilaterally without wheezing or rales. Her left AKA is healing nicely. Are right BKA has a large area where her blister broke but appears to be well perfused.  MEDICAL ISSUES: She will continue dressing changes with bacitracin and I think this area will heal but may take some time. I do not think she should wear her prosthesis until this is healed. I plan on seeing her back in one month. She knows to call sooner if she has problems.  Tevis Conger S [facility]  [place]: [phone]

## 2013-07-19 ENCOUNTER — Encounter (HOSPITAL_BASED_OUTPATIENT_CLINIC_OR_DEPARTMENT_OTHER): Payer: Medicare Other | Attending: Internal Medicine

## 2013-07-19 ENCOUNTER — Encounter: Payer: Medicare Other | Admitting: Physical Therapy

## 2013-07-19 DIAGNOSIS — Y835 Amputation of limb(s) as the cause of abnormal reaction of the patient, or of later complication, without mention of misadventure at the time of the procedure: Secondary | ICD-10-CM | POA: Insufficient documentation

## 2013-07-19 DIAGNOSIS — T8789 Other complications of amputation stump: Secondary | ICD-10-CM | POA: Insufficient documentation

## 2013-07-19 DIAGNOSIS — L97809 Non-pressure chronic ulcer of other part of unspecified lower leg with unspecified severity: Secondary | ICD-10-CM | POA: Insufficient documentation

## 2013-07-19 DIAGNOSIS — E1169 Type 2 diabetes mellitus with other specified complication: Secondary | ICD-10-CM | POA: Insufficient documentation

## 2013-07-20 NOTE — Progress Notes (Signed)
[facility]  NAME:  [name], [name] NO.:  [id number]  MEDICAL RECORD NO.:  [id number]      DATE OF BIRTH:  1955/09/24  PHYSICIAN:  Maxwell Caul, M.D.      VISIT DATE:                                  OFFICE VISIT   [name] is a patient we previously had in this clinic for treatment of diabetic ulcers related to PAD.  At that point, she was here for wounds on her left heel and left groin area status post an extensive arterial reconstruction of her left leg.  She is here today for review of a wound on her right BKA.  The patient states that this has been there for 3 weeks.  She wears a prosthesis on the right leg.  Initially, she put a Tegaderm and continued to wear her prosthesis for about a week.  In that time frame, she was seen by Dr. Edilia Bo and taken out of her prosthesis.  Since then, she has been using Neosporin.  Recently, she has started to notice some raised white areas on the surface of the wound.  PAST MEDICAL HISTORY:  Includes type 2 diabetes, end-stage renal disease on dialysis, known PAD, coronary artery disease with an ischemic cardiomyopathy, diabetic retinopathy, hyperlipidemia, hypertension, obstructive sleep apnea, secondary hyperthyroidism, pulmonary hypertension, history of TIA.  PAST SURGICAL HISTORY:  Left peripheral lower extremity bypass graft, CABG in 2001, right BKA in 2007, and a left AKA in June 2014.  MEDICATION LIST:  Reviewed.  On examination, temperature is 98.1, pulse listed at 147. Unfortunately, she had left the clinic before this was brought to my attention.  Respiratory 16, blood pressure 105/69.  Glucose was 105. Weight 180 pounds.  The wound is over a large area of her right lateral stump.  This is on the weightbearing surface.  The overall dimensions were 7 x 8.5 x 0.1.  This has a fairly healthy looking surface; however, the surface of it was largely covered by adherent raised white  plaques. There is no pain here.  There was no obvious infection, although the patient states there is a fair amount of drainage.  The stump itself is somewhat swollen and cool.  I could not feel a popliteal pulse.  IMPRESSIONS:  A raised blister that is since opened on the surface of the right stump probably due to pressure and friction from her prosthesis.  The covering white plaques here may be candida.  To this end, I have applied ketoconazole, silver alginate, silver also has antifungal properties.  I gave this brisk debridement with wound cleanser and gauze to remove some of this.  We dressed this finally with a Kerlix and Coban.  The patient will change this once at home, and we will see this again in a week's time.  I will have the staff contact her about her tachycardia to make sure that everything is okay at home.          ______________________________ Maxwell Caul, M.D.     MGR/MEDQ  D:  07/19/2013  T:  07/20/2013  Job:  [id number]

## 2013-07-24 ENCOUNTER — Encounter: Payer: Medicare Other | Admitting: Physical Therapy

## 2013-07-26 ENCOUNTER — Other Ambulatory Visit: Payer: Self-pay | Admitting: Nephrology

## 2013-07-26 ENCOUNTER — Encounter: Payer: Medicare Other | Admitting: Physical Therapy

## 2013-07-26 DIAGNOSIS — R609 Edema, unspecified: Secondary | ICD-10-CM

## 2013-07-26 DIAGNOSIS — R52 Pain, unspecified: Secondary | ICD-10-CM

## 2013-07-27 ENCOUNTER — Other Ambulatory Visit: Payer: Medicare Other

## 2013-07-30 ENCOUNTER — Ambulatory Visit
Admission: RE | Admit: 2013-07-30 | Discharge: 2013-07-30 | Disposition: A | Discharge status: Home / Self Care | Payer: Medicare Other | Source: Ambulatory Visit | Attending: Nephrology | Admitting: Nephrology

## 2013-07-30 DIAGNOSIS — R52 Pain, unspecified: Secondary | ICD-10-CM

## 2013-07-30 DIAGNOSIS — R609 Edema, unspecified: Secondary | ICD-10-CM

## 2013-07-31 ENCOUNTER — Other Ambulatory Visit: Payer: Medicare Other

## 2013-07-31 ENCOUNTER — Ambulatory Visit: Payer: Medicare Other | Admitting: Physical Therapy

## 2013-08-02 ENCOUNTER — Encounter: Payer: Medicare Other | Admitting: Physical Therapy

## 2013-08-07 ENCOUNTER — Encounter: Payer: Medicare Other | Admitting: Physical Therapy

## 2013-08-07 ENCOUNTER — Telehealth: Payer: Self-pay

## 2013-08-07 DIAGNOSIS — IMO0002 Reserved for concepts with insufficient information to code with codable children: Secondary | ICD-10-CM

## 2013-08-07 MED ORDER — HYDROCODONE-ACETAMINOPHEN 5-325 MG PO TABS: ORAL_TABLET | ORAL | Status: DC

## 2013-08-07 NOTE — Telephone Encounter (Signed)
Phone call from pt. Requesting pain medication for painful right BKA site, where blister is located. States there is severe pain at times in the blister.  Pt. states the area is getting smaller; continues to go to the Wound Center for treatment.  Also states she has been taking Aleve alternating with Tylenol, but feels she is taking too much of it.  Requesting stronger pain medication.  Discussed with Dr. Arbie Cookey.  Rec'd v.o. for Hydrocodone/ Acetaminophen 5/325 mg 1-2 tabs q 6 hrs prn/ pain; # 30 ; no refills.

## 2013-08-14 ENCOUNTER — Encounter: Payer: Medicare Other | Admitting: Physical Therapy

## 2013-08-16 ENCOUNTER — Ambulatory Visit
Admission: RE | Admit: 2013-08-16 | Discharge: 2013-08-16 | Disposition: A | Discharge status: Home / Self Care | Payer: Medicare Other | Source: Ambulatory Visit | Attending: Obstetrics and Gynecology | Admitting: Obstetrics and Gynecology

## 2013-08-16 ENCOUNTER — Encounter (HOSPITAL_BASED_OUTPATIENT_CLINIC_OR_DEPARTMENT_OTHER): Payer: Medicare Other | Attending: Internal Medicine

## 2013-08-16 ENCOUNTER — Encounter: Payer: Medicare Other | Admitting: Physical Therapy

## 2013-08-16 DIAGNOSIS — L97809 Non-pressure chronic ulcer of other part of unspecified lower leg with unspecified severity: Secondary | ICD-10-CM | POA: Insufficient documentation

## 2013-08-16 DIAGNOSIS — N6310 Unspecified lump in the right breast, unspecified quadrant: Secondary | ICD-10-CM

## 2013-08-16 DIAGNOSIS — S78119A Complete traumatic amputation at level between unspecified hip and knee, initial encounter: Secondary | ICD-10-CM | POA: Insufficient documentation

## 2013-08-16 DIAGNOSIS — S88119A Complete traumatic amputation at level between knee and ankle, unspecified lower leg, initial encounter: Secondary | ICD-10-CM | POA: Insufficient documentation

## 2013-08-16 DIAGNOSIS — E1169 Type 2 diabetes mellitus with other specified complication: Secondary | ICD-10-CM | POA: Insufficient documentation

## 2013-08-21 ENCOUNTER — Encounter: Payer: Self-pay | Admitting: Vascular Surgery

## 2013-08-22 ENCOUNTER — Encounter: Payer: Self-pay | Admitting: Vascular Surgery

## 2013-08-22 ENCOUNTER — Ambulatory Visit (INDEPENDENT_AMBULATORY_CARE_PROVIDER_SITE_OTHER): Payer: Medicare Other | Admitting: Vascular Surgery

## 2013-08-22 VITALS — BP 98/68 | HR 57 | Ht 69.0 in | Wt 181.0 lb

## 2013-08-22 DIAGNOSIS — IMO0002 Reserved for concepts with insufficient information to code with codable children: Secondary | ICD-10-CM | POA: Insufficient documentation

## 2013-08-22 DIAGNOSIS — T148XXA Other injury of unspecified body region, initial encounter: Secondary | ICD-10-CM

## 2013-08-22 DIAGNOSIS — I739 Peripheral vascular disease, unspecified: Secondary | ICD-10-CM

## 2013-08-22 MED ORDER — OXYCODONE-ACETAMINOPHEN 5-325 MG PO TABS: 1.0000 | ORAL_TABLET | ORAL | Status: DC | PRN

## 2013-08-22 NOTE — Progress Notes (Signed)
   Patient name: [name] MRN: [id number] DOB: 1956-05-11 Sex: female  REASON FOR VISIT: follow up of right below-the-knee amputation.   HPI: [name] is a 57 y.o. female who had a left above-the-knee amputation performed on 03/27/2013. This has healed nicely. She had previous old right below the knee amputation and has developed a blister on this and now has an open ulcer on her right below the knee amputation site. This is followed in the wound care center and [facility] and also home health is doing dressing changes with Silvadene. He denies fever or chills. The wound is somewhat painful. There is been no significant drainage. She describes some burning sensation over the entire right below-the-knee amputation which may be neuropathic pain.  REVIEW OF SYSTEMS: Arly.Keller ] denotes positive finding; [  ] denotes negative finding  CARDIOVASCULAR:  [ ]  chest pain   [ ]  dyspnea on exertion    CONSTITUTIONAL:  [ ]  fever   [ ]  chills  PHYSICAL EXAM: Filed Vitals:   08/22/13 1323  BP: 98/68  Pulse: 57  Height: 5\' 9"  (1.753 m)  Weight: 181 lb (82.101 kg)  SpO2: 100%   Body mass index is 26.72 kg/(m^2). GENERAL: The patient is a well-nourished female, in no acute distress. The vital signs are documented above. CARDIOVASCULAR: There is a regular rate and rhythm. PULMONARY: There is good air exchange bilaterally without wheezing or rales. The left above-the-knee amputation is healed nicely. The right below the knee the patient has an open area that is approximately 8 cm x 6 cm in maximum diameter. The stump appears adequately perfused and is not cool. There is no drainage to suggest infection.  MEDICAL ISSUES: She will continue her dressing changes with the wound care center. I'll plan on seeing her back in 3 months. She knows to call sooner she has problems. I have written her a prescription for 30 oxycodone for pain.  DICKSON,CHRISTOPHER S [facility]  [place] [facility]: [phone]

## 2013-08-23 ENCOUNTER — Encounter: Payer: Self-pay | Admitting: *Deleted

## 2013-08-27 ENCOUNTER — Encounter (HOSPITAL_COMMUNITY): Payer: Self-pay | Admitting: Pharmacy Technician

## 2013-09-08 ENCOUNTER — Emergency Department (HOSPITAL_COMMUNITY): Payer: Medicare Other

## 2013-09-08 ENCOUNTER — Encounter (HOSPITAL_COMMUNITY): Payer: Self-pay | Admitting: Emergency Medicine

## 2013-09-08 ENCOUNTER — Inpatient Hospital Stay (HOSPITAL_COMMUNITY)
Admission: EM | Admit: 2013-09-08 | Discharge: 2013-09-18 | DRG: 308 | Disposition: A | Discharge status: Home / Self Care | Payer: Medicare Other | Attending: Internal Medicine | Admitting: Internal Medicine

## 2013-09-08 DIAGNOSIS — D472 Monoclonal gammopathy: Secondary | ICD-10-CM

## 2013-09-08 DIAGNOSIS — I9589 Other hypotension: Secondary | ICD-10-CM | POA: Diagnosis present

## 2013-09-08 DIAGNOSIS — I5032 Chronic diastolic (congestive) heart failure: Secondary | ICD-10-CM | POA: Diagnosis present

## 2013-09-08 DIAGNOSIS — M86179 Other acute osteomyelitis, unspecified ankle and foot: Secondary | ICD-10-CM

## 2013-09-08 DIAGNOSIS — T8789 Other complications of amputation stump: Secondary | ICD-10-CM

## 2013-09-08 DIAGNOSIS — Z992 Dependence on renal dialysis: Secondary | ICD-10-CM

## 2013-09-08 DIAGNOSIS — I4891 Unspecified atrial fibrillation: Principal | ICD-10-CM | POA: Diagnosis present

## 2013-09-08 DIAGNOSIS — I2589 Other forms of chronic ischemic heart disease: Secondary | ICD-10-CM | POA: Diagnosis present

## 2013-09-08 DIAGNOSIS — E1351 Other specified diabetes mellitus with diabetic peripheral angiopathy without gangrene: Secondary | ICD-10-CM

## 2013-09-08 DIAGNOSIS — I252 Old myocardial infarction: Secondary | ICD-10-CM

## 2013-09-08 DIAGNOSIS — D72829 Elevated white blood cell count, unspecified: Secondary | ICD-10-CM

## 2013-09-08 DIAGNOSIS — G9340 Encephalopathy, unspecified: Secondary | ICD-10-CM

## 2013-09-08 DIAGNOSIS — Z6826 Body mass index (BMI) 26.0-26.9, adult: Secondary | ICD-10-CM

## 2013-09-08 DIAGNOSIS — I499 Cardiac arrhythmia, unspecified: Secondary | ICD-10-CM

## 2013-09-08 DIAGNOSIS — T8189XA Other complications of procedures, not elsewhere classified, initial encounter: Secondary | ICD-10-CM

## 2013-09-08 DIAGNOSIS — D696 Thrombocytopenia, unspecified: Secondary | ICD-10-CM | POA: Diagnosis present

## 2013-09-08 DIAGNOSIS — L97809 Non-pressure chronic ulcer of other part of unspecified lower leg with unspecified severity: Secondary | ICD-10-CM | POA: Diagnosis present

## 2013-09-08 DIAGNOSIS — E1129 Type 2 diabetes mellitus with other diabetic kidney complication: Secondary | ICD-10-CM | POA: Diagnosis present

## 2013-09-08 DIAGNOSIS — L089 Local infection of the skin and subcutaneous tissue, unspecified: Secondary | ICD-10-CM

## 2013-09-08 DIAGNOSIS — M329 Systemic lupus erythematosus, unspecified: Secondary | ICD-10-CM | POA: Diagnosis present

## 2013-09-08 DIAGNOSIS — I798 Other disorders of arteries, arterioles and capillaries in diseases classified elsewhere: Secondary | ICD-10-CM | POA: Diagnosis present

## 2013-09-08 DIAGNOSIS — J189 Pneumonia, unspecified organism: Secondary | ICD-10-CM

## 2013-09-08 DIAGNOSIS — I10 Essential (primary) hypertension: Secondary | ICD-10-CM

## 2013-09-08 DIAGNOSIS — G5680 Other specified mononeuropathies of unspecified upper limb: Secondary | ICD-10-CM | POA: Diagnosis present

## 2013-09-08 DIAGNOSIS — S88119A Complete traumatic amputation at level between knee and ankle, unspecified lower leg, initial encounter: Secondary | ICD-10-CM

## 2013-09-08 DIAGNOSIS — Z79899 Other long term (current) drug therapy: Secondary | ICD-10-CM

## 2013-09-08 DIAGNOSIS — I70209 Unspecified atherosclerosis of native arteries of extremities, unspecified extremity: Secondary | ICD-10-CM

## 2013-09-08 DIAGNOSIS — IMO0002 Reserved for concepts with insufficient information to code with codable children: Secondary | ICD-10-CM

## 2013-09-08 DIAGNOSIS — N2581 Secondary hyperparathyroidism of renal origin: Secondary | ICD-10-CM

## 2013-09-08 DIAGNOSIS — I120 Hypertensive chronic kidney disease with stage 5 chronic kidney disease or end stage renal disease: Secondary | ICD-10-CM | POA: Diagnosis present

## 2013-09-08 DIAGNOSIS — G4733 Obstructive sleep apnea (adult) (pediatric): Secondary | ICD-10-CM

## 2013-09-08 DIAGNOSIS — Z8673 Personal history of transient ischemic attack (TIA), and cerebral infarction without residual deficits: Secondary | ICD-10-CM

## 2013-09-08 DIAGNOSIS — Z951 Presence of aortocoronary bypass graft: Secondary | ICD-10-CM

## 2013-09-08 DIAGNOSIS — I739 Peripheral vascular disease, unspecified: Secondary | ICD-10-CM

## 2013-09-08 DIAGNOSIS — I2510 Atherosclerotic heart disease of native coronary artery without angina pectoris: Secondary | ICD-10-CM

## 2013-09-08 DIAGNOSIS — D638 Anemia in other chronic diseases classified elsewhere: Secondary | ICD-10-CM | POA: Diagnosis not present

## 2013-09-08 DIAGNOSIS — E785 Hyperlipidemia, unspecified: Secondary | ICD-10-CM

## 2013-09-08 DIAGNOSIS — Z7982 Long term (current) use of aspirin: Secondary | ICD-10-CM

## 2013-09-08 DIAGNOSIS — N186 End stage renal disease: Secondary | ICD-10-CM

## 2013-09-08 DIAGNOSIS — A419 Sepsis, unspecified organism: Secondary | ICD-10-CM

## 2013-09-08 DIAGNOSIS — T148XXA Other injury of unspecified body region, initial encounter: Secondary | ICD-10-CM

## 2013-09-08 DIAGNOSIS — Z89511 Acquired absence of right leg below knee: Secondary | ICD-10-CM

## 2013-09-08 DIAGNOSIS — I255 Ischemic cardiomyopathy: Secondary | ICD-10-CM

## 2013-09-08 DIAGNOSIS — D631 Anemia in chronic kidney disease: Secondary | ICD-10-CM

## 2013-09-08 DIAGNOSIS — S78119A Complete traumatic amputation at level between unspecified hip and knee, initial encounter: Secondary | ICD-10-CM

## 2013-09-08 DIAGNOSIS — I2789 Other specified pulmonary heart diseases: Secondary | ICD-10-CM | POA: Diagnosis present

## 2013-09-08 DIAGNOSIS — M899 Disorder of bone, unspecified: Secondary | ICD-10-CM | POA: Diagnosis present

## 2013-09-08 DIAGNOSIS — I959 Hypotension, unspecified: Secondary | ICD-10-CM

## 2013-09-08 DIAGNOSIS — E1159 Type 2 diabetes mellitus with other circulatory complications: Secondary | ICD-10-CM | POA: Diagnosis present

## 2013-09-08 DIAGNOSIS — I639 Cerebral infarction, unspecified: Secondary | ICD-10-CM

## 2013-09-08 DIAGNOSIS — R0902 Hypoxemia: Secondary | ICD-10-CM | POA: Diagnosis not present

## 2013-09-08 DIAGNOSIS — I509 Heart failure, unspecified: Secondary | ICD-10-CM | POA: Diagnosis present

## 2013-09-08 DIAGNOSIS — M79609 Pain in unspecified limb: Secondary | ICD-10-CM

## 2013-09-08 DIAGNOSIS — G4700 Insomnia, unspecified: Secondary | ICD-10-CM

## 2013-09-08 DIAGNOSIS — Z794 Long term (current) use of insulin: Secondary | ICD-10-CM

## 2013-09-08 LAB — BASIC METABOLIC PANEL
CO2: 32 mEq/L (ref 19–32)
Calcium: 9.5 mg/dL (ref 8.4–10.5)
GFR calc non Af Amer: 10 mL/min — ABNORMAL LOW (ref >90)
Glucose, Bld: 185 mg/dL — ABNORMAL HIGH (ref 70–99)
Potassium: 4.5 mEq/L (ref 3.5–5.1)
Sodium: 131 mEq/L — ABNORMAL LOW (ref 135–145)

## 2013-09-08 LAB — PRO B NATRIURETIC PEPTIDE: Pro B Natriuretic peptide (BNP): 32871 pg/mL — ABNORMAL HIGH (ref 0–125)

## 2013-09-08 LAB — CBC
HCT: 46 % (ref 36.0–46.0)
Hemoglobin: 16 g/dL — ABNORMAL HIGH (ref 12.0–15.0)
Hemoglobin: 15.5 g/dL — ABNORMAL HIGH (ref 12.0–15.0)
MCH: 27.6 pg (ref 26.0–34.0)
MCV: 84.1 fL (ref 78.0–100.0)
Platelets: 127 10*3/uL — ABNORMAL LOW (ref 150–400)
RBC: 5.79 MIL/uL — ABNORMAL HIGH (ref 3.87–5.11)
WBC: 9.5 10*3/uL (ref 4.0–10.5)

## 2013-09-08 LAB — TROPONIN I: Troponin I: <0.3 ng/mL (ref <0.30)

## 2013-09-08 MED ORDER — HYDROMORPHONE HCL PF 1 MG/ML IJ SOLN
1.0000 mg | INTRAMUSCULAR | Status: AC | PRN
  Filled 2013-09-08: qty 1

## 2013-09-08 MED ORDER — SEVELAMER CARBONATE 800 MG PO TABS
1600.0000 mg | ORAL_TABLET | Freq: Three times a day (TID) | ORAL | Status: DC
  Administered 2013-09-09 – 2013-09-18 (×19): 1600 mg via ORAL
  Filled 2013-09-08 (×31): qty 2

## 2013-09-08 MED ORDER — MORPHINE SULFATE 4 MG/ML IJ SOLN
4.0000 mg | Freq: Once | INTRAMUSCULAR | Status: AC
  Administered 2013-09-08: 4 mg via INTRAVENOUS
  Filled 2013-09-08: qty 1

## 2013-09-08 MED ORDER — DIALYVITE 3000 3 MG PO TABS: 1.0000 | ORAL_TABLET | Freq: Every day | ORAL | Status: DC

## 2013-09-08 MED ORDER — MIDODRINE HCL 5 MG PO TABS
10.0000 mg | ORAL_TABLET | ORAL | Status: DC
  Administered 2013-09-10: 10 mg via ORAL
  Filled 2013-09-08 (×2): qty 2

## 2013-09-08 MED ORDER — HEPARIN (PORCINE) IN NACL 100-0.45 UNIT/ML-% IJ SOLN
1200.0000 [IU]/h | INTRAMUSCULAR | Status: DC
  Administered 2013-09-08: 1200 [IU]/h via INTRAVENOUS
  Filled 2013-09-08: qty 250

## 2013-09-08 MED ORDER — DILTIAZEM HCL 100 MG IV SOLR
5.0000 mg/h | Freq: Once | INTRAVENOUS | Status: AC
  Administered 2013-09-08: 5 mg/h via INTRAVENOUS

## 2013-09-08 MED ORDER — ONDANSETRON HCL 4 MG PO TABS: 4.0000 mg | ORAL_TABLET | Freq: Four times a day (QID) | ORAL | Status: DC | PRN

## 2013-09-08 MED ORDER — HYDROMORPHONE HCL PF 1 MG/ML IJ SOLN: 0.5000 mg | INTRAMUSCULAR | Status: DC | PRN

## 2013-09-08 MED ORDER — RENA-VITE PO TABS
1.0000 | ORAL_TABLET | Freq: Every day | ORAL | Status: DC
  Administered 2013-09-09 – 2013-09-18 (×9): 1 via ORAL
  Filled 2013-09-08 (×10): qty 1

## 2013-09-08 MED ORDER — ACETAMINOPHEN 650 MG RE SUPP: 650.0000 mg | Freq: Four times a day (QID) | RECTAL | Status: DC | PRN

## 2013-09-08 MED ORDER — CINACALCET HCL 30 MG PO TABS: 60.0000 mg | ORAL_TABLET | Freq: Every day | ORAL | Status: DC

## 2013-09-08 MED ORDER — CINACALCET HCL 30 MG PO TABS
60.0000 mg | ORAL_TABLET | Freq: Every day | ORAL | Status: DC
  Administered 2013-09-09 – 2013-09-18 (×9): 60 mg via ORAL
  Filled 2013-09-08 (×12): qty 2

## 2013-09-08 MED ORDER — OXYCODONE HCL 5 MG PO TABS
5.0000 mg | ORAL_TABLET | ORAL | Status: DC | PRN
  Administered 2013-09-08 – 2013-09-09 (×3): 5 mg via ORAL
  Filled 2013-09-08 (×3): qty 1

## 2013-09-08 MED ORDER — ATORVASTATIN CALCIUM 10 MG PO TABS
10.0000 mg | ORAL_TABLET | Freq: Every day | ORAL | Status: DC
  Administered 2013-09-09 – 2013-09-18 (×9): 10 mg via ORAL
  Filled 2013-09-08 (×12): qty 1

## 2013-09-08 MED ORDER — PREGABALIN 25 MG PO CAPS
25.0000 mg | ORAL_CAPSULE | Freq: Two times a day (BID) | ORAL | Status: DC
  Administered 2013-09-08 – 2013-09-11 (×5): 25 mg via ORAL
  Filled 2013-09-08 (×6): qty 1

## 2013-09-08 MED ORDER — B COMPLEX-C PO TABS
1.0000 | ORAL_TABLET | Freq: Every day | ORAL | Status: DC
  Administered 2013-09-09 – 2013-09-18 (×9): 1 via ORAL
  Filled 2013-09-08 (×10): qty 1

## 2013-09-08 MED ORDER — SODIUM CHLORIDE 0.9 % IV SOLN: 250.0000 mL | INTRAVENOUS | Status: DC | PRN

## 2013-09-08 MED ORDER — SODIUM CHLORIDE 0.9 % IJ SOLN: 3.0000 mL | INTRAMUSCULAR | Status: DC | PRN

## 2013-09-08 MED ORDER — ASPIRIN 325 MG PO TABS: 325.0000 mg | ORAL_TABLET | Freq: Every day | ORAL | Status: DC

## 2013-09-08 MED ORDER — ZOLPIDEM TARTRATE 5 MG PO TABS: 5.0000 mg | ORAL_TABLET | Freq: Every evening | ORAL | Status: DC | PRN

## 2013-09-08 MED ORDER — ACETAMINOPHEN 325 MG PO TABS
650.0000 mg | ORAL_TABLET | Freq: Four times a day (QID) | ORAL | Status: DC | PRN
  Administered 2013-09-08 – 2013-09-12 (×2): 650 mg via ORAL
  Filled 2013-09-08 (×2): qty 2

## 2013-09-08 MED ORDER — SODIUM CHLORIDE 0.9 % IJ SOLN
3.0000 mL | Freq: Two times a day (BID) | INTRAMUSCULAR | Status: DC
  Administered 2013-09-08: 3 mL via INTRAVENOUS

## 2013-09-08 MED ORDER — ONDANSETRON HCL 4 MG/2ML IJ SOLN: 4.0000 mg | Freq: Three times a day (TID) | INTRAMUSCULAR | Status: AC | PRN

## 2013-09-08 MED ORDER — ASPIRIN EC 325 MG PO TBEC
325.0000 mg | DELAYED_RELEASE_TABLET | Freq: Every day | ORAL | Status: DC
  Administered 2013-09-09: 325 mg via ORAL
  Filled 2013-09-08: qty 1

## 2013-09-08 MED ORDER — MIDODRINE HCL 5 MG PO TABS: 10.0000 mg | ORAL_TABLET | ORAL | Status: DC

## 2013-09-08 MED ORDER — ONDANSETRON HCL 4 MG/2ML IJ SOLN: 4.0000 mg | Freq: Four times a day (QID) | INTRAMUSCULAR | Status: DC | PRN

## 2013-09-08 NOTE — H&P (Signed)
[facility] History and Physical  [name] EAV:[id number] DOB: [birth year] DOA: 09/08/2013  Referring physician:  EDP PCP: Trevor Iha, MD  Specialists:   Chief Complaint:   Right Sided Chest Pain  HPI: [name] is a 57 y.o. female with Multiple Medical Problems including ESRD on HD who presents to the ED with complaints of Right Sided Chest pain that started in the afternoon.  She reports having sharp pain in the right chest under her right breast area radiating into her back and she rated the pain at the worse as a 9/10.  She denies feeling any palpitations, but she did feel SOB.  She also denied having any nausea or vomiting or Diaphoresis.  She was brought to the ED and was found to have new onset of Atrial Fibrillation with RVR.  She was laced on an IV Diltiazem Drip and referred for admission.      Review of Systems: The patient denies anorexia, fever, chills, headaches, weight loss, vision loss, diplopia, dizziness, decreased hearing, rhinitis, hoarseness, syncope, dyspnea on exertion, peripheral edema, balance deficits, cough, hemoptysis, abdominal pain, nausea, vomiting, diarrhea, constipation, hematemesis, melena, hematochezia, severe indigestion/heartburn, dysuria, hematuria, incontinence, muscle weakness, suspicious skin lesions, transient blindness, difficulty walking, depression, unusual weight change, abnormal bleeding, enlarged lymph nodes, angioedema, and breast masses.    Past Medical History  Diagnosis Date  . Diabetes mellitus, type 2   . Secondary hyperparathyroidism   . OSA (obstructive sleep apnea)     NPSG 11.23.11-AHI 64.9/hr-central and obstructive apnea  . Ischemic cardiomyopathy     EF 25-30% 2011  . CAD (coronary artery disease)   . Pulmonary hypertension     Est PAsyst 60-Dr Mendel Ryder, off coumadin, CT neg for PE  . Hyperlipidemia   . Anemia   . Thyroid disease   . Pulmonary edema   . Peripheral vascular disease   . Sebaceous cyst    . 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 Velvet Bathe  . Irregular heart beat   . Shortness of breath     with exertion  . Hemodialysis patient     M,W,F  . TIA (transient ischemic attack) June 2014  . Ulcer of heel due to diabetes     left  . Kidney disease Dialysis MWF    ESRD secondary to DM, failed transplant 2006. Dialysis    Past Surgical History  Procedure Laterality Date  . Coronary artery bypass graft  2011    3V  . Insertion of dialysis catheter    . 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 Nasal mucosal biopsy    . Ovarian cyst surgery    . 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 Laser retinal surgery  2011  . Kidney transplant      rejection  . Abdominal angiogram Left 01/25/13  . Colonoscopy    . Cardiac catheterization    . Eye surgery    . Femoral-popliteal bypass graft Left 02/20/2013    Procedure:  FEMORAL-to below knee POPLITEAL ARTERY bypasswith saphenous vein with intraoperative angiogram ;  Surgeon: Chuck Hint, MD;  Location: [facility] OR;  Service: Vascular;  Laterality: Left;  fem-below knee pop  . Amputation Left 03/27/2013    Procedure: AMPUTATION ABOVE KNEE;  Surgeon: Chuck Hint, MD;  Location: [facility] OR;  Service: Vascular;  Laterality: Left;    Prior to Admission medications   Medication Sig Start Date End Date Taking?  Authorizing Provider  acetaminophen (TYLENOL) 325 MG tablet Take 650 mg by mouth every 6 (six) hours as needed for pain.    Yes Historical Provider, MD  aspirin 325 MG tablet Take 325 mg by mouth daily.   Yes Historical Provider, MD  atorvastatin (LIPITOR) 10 MG tablet Take 10 mg by mouth daily before breakfast.    Yes Historical Provider, MD  B Complex-C (B-COMPLEX WITH VITAMIN C) tablet Take 1 tablet by mouth daily.   Yes Historical Provider, MD  cinacalcet (SENSIPAR) 60 MG tablet Take 60 mg by mouth daily.    Yes Historical Provider, MD  folic acid-vitamin b  complex-vitamin c-selenium-zinc (DIALYVITE) 3 MG TABS tablet Take 1 tablet by mouth daily.   Yes Historical Provider, MD  HYDROcodone-acetaminophen (NORCO/VICODIN) 5-325 MG per tablet Take 1 tablet by mouth every 6 (six) hours as needed for moderate pain.   Yes Historical Provider, MD  insulin aspart (NOVOLOG) 100 UNIT/ML injection Inject 3-15 Units into the skin 3 (three) times daily before meals. Sliding scale   Yes Historical Provider, MD  midodrine (PROAMATINE) 10 MG tablet Take 10 mg by mouth every Monday, Wednesday, and Friday.    Yes Historical Provider, MD  pregabalin (LYRICA) 25 MG capsule Take 25 mg by mouth 2 (two) times daily. 05/23/13  Yes Trevor Iha, MD  sevelamer carbonate (RENVELA) 800 MG tablet Take 1,600 mg by mouth 3 (three) times daily with meals.   Yes Historical Provider, MD    Allergies  Allergen Reactions  . Neurontin [Gabapentin] Anaphylaxis  . Ezetimibe-Simvastatin Other (See Comments)    REACTION: face edema; itching  . Klonopin [Clonazepam] Other (See Comments)    hallucination  . Sertraline Hcl Other (See Comments)    hallucination    Social History:  reports that she has never smoked. She has never used smokeless tobacco. She reports that she does not drink alcohol or use illicit drugs.     Family History  Problem Relation Age of Onset  . Heart disease    . Edema Father     fluid overload  . Cerebral aneurysm Mother   . Stroke Brother   . Heart disease Brother       Physical Exam:  GEN:  Pleasant Morbidly Obese  57 y.o.  African American female  examined  and in no acute distress; cooperative with exam Filed Vitals:   09/08/13 1930 09/08/13 1945 09/08/13 2000 09/08/13 2045  BP: 96/61 101/61 114/78 104/57  Pulse: 85 101 93 96  Temp:      TempSrc:    Temporal  Resp: 18 15 12 16   SpO2: 97% 95% 98% 96%   Blood pressure 104/57, pulse 96, temperature 98.1 F (36.7 C), temperature source Oral, resp. rate 16, SpO2 96.00%. PSYCH: She is alert  and oriented x4; does not appear anxious does not appear depressed; affect is normal HEENT: Normocephalic and Atraumatic, Mucous membranes pink; PERRLA; EOM intact; Fundi:  Benign;  No scleral icterus, Nares: Patent, Oropharynx: Clear, Edentulous with Dentures,  Neck:  FROM, no cervical lymphadenopathy nor thyromegaly or carotid bruit; no JVD; Breasts:: Not examined CHEST WALL: No tenderness CHEST: Normal respiration, clear to auscultation bilaterally HEART: Regular rate and rhythm; no murmurs rubs or gallops BACK: No kyphosis or scoliosis; no CVA tenderness ABDOMEN: Positive Bowel Sounds, Obese, soft non-tender; no masses, no organomegaly, no pannus; no intertriginous candida. Rectal Exam: Not done EXTREMITIES: Right BKA with Wound on Stump area,  Leftt AKA,    Genitalia: not examined PULSES:  2+ and symmetric SKIN: Normal hydration no rash or ulceration CNS: Cranial nerves 2-12 grossly intact no focal neurologic deficit    Labs on Admission:  Basic Metabolic Panel:  Recent Labs Lab 09/08/13 1546  NA 131*  K 4.5  CL 88*  CO2 32  GLUCOSE 185*  BUN 35*  CREATININE 4.50*  CALCIUM 9.5   Liver Function Tests: No results found for this basename: AST, ALT, ALKPHOS, BILITOT, PROT, ALBUMIN,  in the last 168 hours No results found for this basename: LIPASE, AMYLASE,  in the last 168 hours No results found for this basename: AMMONIA,  in the last 168 hours CBC:  Recent Labs Lab 09/08/13 1546  WBC 9.6  HGB 16.0*  HCT 48.7*  MCV 84.1  PLT 127*   Cardiac Enzymes:  Recent Labs Lab 09/08/13 1546  TROPONINI <0.30    BNP (last 3 results)  Recent Labs  09/08/13 1546  PROBNP 32871.0*   CBG: No results found for this basename: GLUCAP,  in the last 168 hours  Radiological Exams on Admission: Dg Chest 2 View  09/08/2013   CLINICAL DATA:  Chest pain, shortness of breath, history coronary artery disease post CABG, diabetes, ischemic cardiomyopathy, on CPAP at night  EXAM:  CHEST  2 VIEW  COMPARISON:  02/13/2013  FINDINGS: Enlargement of cardiac silhouette post CABG.  Slight pulmonary vascular congestion.  Mediastinal contours normal.  Peribronchial thickening with right basilar infiltrate question pneumonia.  Slight chronic accentuation of perihilar markings little changed from previous study.  No gross pleural effusion or pneumothorax.  No acute osseous findings.  IMPRESSION: Enlargement of cardiac silhouette with pulmonary vascular congestion post CABG.  Bronchitic changes with right lower lobe infiltrate question pneumonia.   Electronically Signed   By: Ulyses Southward M.D.   On: 09/08/2013 15:48     EKG: Independently reviewed.   Atrial fibrillation rate= 110.      Assessment/Plan Principal Problem:   Atrial fibrillation with RVR Active Problems:   HYPERLIPIDEMIA   HYPERTENSION   PVD (peripheral vascular disease)   Cardiomyopathy, ischemic   DM (diabetes mellitus), secondary, w/peripheral vascular complications   ESRD (end stage renal disease) on dialysis   Delayed surgical wound healing of below-the-knee amputation stump    Chronic Diastolic CHF.      1.  Atrial Fibrillation with RVR-   Placed on IV Diltiazem Drip and IV heparin drip, and ASA, cycle Troponins,  Cards consulted and seeing.     2.  Chest Pain-  Hx of Ischemic Cardiomyopathy, cycle Troponins.     2.  ESRD on HD-  On MWF, Notify dialysis Team and monitor for Sxs of Fluid Overload,  Cards managing CHF.     3.  PVD/  And Wound of Right BKA-  Wound Care eval for continued care of Right BKA Wound.    4.  Ischemic Cardiomyopathy  / and Chronic Diastolic CHF-   Cycle troponins, ASA Rx, and Cards following.   Dialysis if Volume Overloaded.    5.  DM2-  Continue     And SSI coverage PRN,  Check HBA1C in AM.    6.  HTN- monitor BPs.    7.  Hyperlipidemia-  Continue Atorvastatin Rx.           Code Status:    FULL CODE Family Communication:    Family at bedside Disposition Plan:        Inpatient  Time spent:  68 Minutes  Ron Parker [facility] Pager [phone]  If 7PM-7AM, please contact night-coverage [email or web address] Password [email or web address] 09/08/2013, 9:00 PM

## 2013-09-08 NOTE — ED Notes (Signed)
Patient presents to ED via EMS from home with complaints of right sided chest pain for the past 2-3. When fire arrived at house patient was having 8/10 chest pain, they gave 324 aspirin and non re-breather. EMS placed 22g to right inner wrist gave 20mg  IV cardizem for afib at 160, patient slowed down to 100 still afib, and gave 1 sl nitro.

## 2013-09-08 NOTE — Progress Notes (Signed)
ANTICOAGULATION CONSULT NOTE - Initial Consult  Pharmacy Consult for Heparin Indication: atrial fibrillation  Allergies  Allergen Reactions  . Neurontin [Gabapentin] Anaphylaxis  . Ezetimibe-Simvastatin Other (See Comments)    REACTION: face edema; itching  . Klonopin [Clonazepam] Other (See Comments)    hallucination  . Sertraline Hcl Other (See Comments)    hallucination    Patient Measurements:   Weight 82.1 kg  Vital Signs: Temp: 98.1 F (36.7 C) (12/27 1405) Temp src: Oral (12/27 1405) BP: 101/61 mmHg (12/27 1945) Pulse Rate: 101 (12/27 1945)  Labs:  Recent Labs  09/08/13 1546  HGB 16.0*  HCT 48.7*  PLT 127*  CREATININE 4.50*  TROPONINI <0.30    The CrCl is unknown because both a height and weight (above a minimum accepted value) are required for this calculation.   Medical History: Past Medical History  Diagnosis Date  . Diabetes mellitus, type 2   . Secondary hyperparathyroidism   . OSA (obstructive sleep apnea)     NPSG 11.23.11-AHI 64.9/hr-central and obstructive apnea  . Ischemic cardiomyopathy     EF 25-30% 2011  . CAD (coronary artery disease)   . Pulmonary hypertension     Est PAsyst 60-Dr Mendel Ryder, off coumadin, CT neg for PE  . Hyperlipidemia   . Anemia   . Thyroid disease   . Pulmonary edema   . Peripheral vascular disease   . Sebaceous cyst   . 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 Velvet Bathe  . Irregular heart beat   . Shortness of breath     with exertion  . Hemodialysis patient     M,W,F  . TIA (transient ischemic attack) June 2014  . Ulcer of heel due to diabetes     left  . Kidney disease Dialysis MWF    ESRD secondary to DM, failed transplant 2006. Dialysis    Medications:  Infusions:    Assessment: 57 year old female admitted with new-onset atrial fibrillation to begin anticoagulation with Heparin.  Note she is HD-dependent.  Goal of Therapy:  Heparin level 0.3-0.7 units/ml Monitor platelets by anticoagulation  protocol: Yes   Plan:  Per Cardiology note - no Heparin bolus requested Start Heparin infusion at 1200 units/hr Check Heparin level and CBC in 8 hours Check baseline PTT, PT/INR, CBC  Estella Husk, Pharm.D., BCPS, AAHIVP Clinical Pharmacist Phone: [phone] or [phone] 09/08/2013, 8:30 PM

## 2013-09-08 NOTE — ED Provider Notes (Signed)
Medical screening examination/treatment/procedure(s) were conducted as a shared visit with non-physician practitioner(s) and myself.  I personally evaluated the patient during the encounter.  EKG Interpretation    Date/Time:  Saturday September 08 2013 14:02:32 EST Ventricular Rate:  110 PR Interval:    QRS Duration: 87 QT Interval:  386 QTC Calculation: 522 R Axis:   -159 Text Interpretation:  Atrial fibrillation Low voltage, extremity and precordial leads Probable right ventricular hypertrophy Prolonged QT interval Compared to previous tracing Atrial fibrillation NOW PRESENT Confirmed by [facility]  MD, Nickalaus Crooke ([id number]) on 09/08/2013 3:52:17 PM           2 days mild burning CP with slight SOB, rapid Afib 150's improved to 110 by EMS diltiazem; L:CTA, CV: irreg  Hurman Horn, MD 09/10/13 [phone]

## 2013-09-08 NOTE — ED Provider Notes (Signed)
CSN: [id number]     Arrival date & time 09/08/13  1350 History   First MD Initiated Contact with Patient 09/08/13 1514     Chief Complaint  Patient presents with  . Chest Pain   (Consider location/radiation/quality/duration/timing/severity/associated sxs/prior Treatment) The history is provided by the patient and medical records.   This is a 57 year old female with past medical history significant for CAD, hyperlipidemia, pulmonary hypertension, diabetes on hemodialysis, TIA, presenting to the ED for right-sided chest pain on waking this morning. Denies radiation into extremities, SOB, or sensation of palpitations.  When EMS arrived at her house patient was found to be in new onset A. fib at a rate of 116. Patient was given 20 mg of Cardizem and 1 SL NTG with rate slowed to 110 but still persistent A. fib. Patient has a history of prior atrial tachycardia during admission earlier this year, no history of A. fib. She's not currently on any anticoagulants.  Dialysis schedule is MWF, has been altered recently due to Christmas holiday.  Last dialysis was yesterday, due for next session tomorrow.  On arrival pt states chest pain has improved, now feels somewhat like a "burning" sensation in her mid-sternal region.   Cardiologist-- Verdis Prime  Past Medical History  Diagnosis Date  . Diabetes mellitus, type 2   . Secondary hyperparathyroidism   . OSA (obstructive sleep apnea)     NPSG 11.23.11-AHI 64.9/hr-central and obstructive apnea  . Ischemic cardiomyopathy     EF 25-30% 2011  . CAD (coronary artery disease)   . Pulmonary hypertension     Est PAsyst 60-Dr Mendel Ryder, off coumadin, CT neg for PE  . Hyperlipidemia   . Anemia   . Thyroid disease   . Pulmonary edema   . Peripheral vascular disease   . Sebaceous cyst   . 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 Velvet Bathe  . Irregular heart beat   . Shortness of breath     with exertion  . Hemodialysis patient     M,W,F  . TIA (transient  ischemic attack) June 2014  . Ulcer of heel due to diabetes     left  . Kidney disease Dialysis MWF    ESRD secondary to DM, failed transplant 2006. Dialysis   Past Surgical History  Procedure Laterality Date  . Coronary artery bypass graft  2011    3V  . Insertion of dialysis catheter    . 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 Nasal mucosal biopsy    . Ovarian cyst surgery    . 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 Laser retinal surgery  2011  . Kidney transplant      rejection  . Abdominal angiogram Left 01/25/13  . Colonoscopy    . Cardiac catheterization    . Eye surgery    . Femoral-popliteal bypass graft Left 02/20/2013    Procedure:  FEMORAL-to below knee POPLITEAL ARTERY bypasswith saphenous vein with intraoperative angiogram ;  Surgeon: Chuck Hint, MD;  Location: [facility] OR;  Service: Vascular;  Laterality: Left;  fem-below knee pop  . Amputation Left 03/27/2013    Procedure: AMPUTATION ABOVE KNEE;  Surgeon: Chuck Hint, MD;  Location: [facility] OR;  Service: Vascular;  Laterality: Left;   Family History  Problem Relation Age of Onset  . Heart disease    . Edema Father     fluid overload  . Cerebral aneurysm Mother   .  Stroke Brother   . Heart disease Brother    History  Substance Use Topics  . Smoking status: Never Smoker   . Smokeless tobacco: Never Used  . Alcohol Use: No   OB History   Grav Para Term Preterm Abortions TAB SAB Ect Mult Living                 Review of Systems  Cardiovascular: Positive for chest pain.  All other systems reviewed and are negative.    Allergies  Neurontin; Ezetimibe-simvastatin; Klonopin; and Sertraline hcl  Home Medications   Current Outpatient Rx  Name  Route  Sig  Dispense  Refill  . acetaminophen (TYLENOL) 325 MG tablet   Oral   Take 650 mg by mouth every 6 (six) hours as needed for pain.          [name] aspirin 325 MG tablet   Oral   Take  325 mg by mouth daily.         [name] atorvastatin (LIPITOR) 10 MG tablet   Oral   Take 10 mg by mouth daily before breakfast.          . B Complex-C (B-COMPLEX WITH VITAMIN C) tablet   Oral   Take 1 tablet by mouth daily.         . cinacalcet (SENSIPAR) 60 MG tablet   Oral   Take 60 mg by mouth daily.          . folic acid-vitamin b complex-vitamin c-selenium-zinc (DIALYVITE) 3 MG TABS tablet   Oral   Take 1 tablet by mouth daily.         [name] HYDROcodone-acetaminophen (NORCO/VICODIN) 5-325 MG per tablet   Oral   Take 1 tablet by mouth every 6 (six) hours as needed for moderate pain.         [name] insulin aspart (NOVOLOG) 100 UNIT/ML injection   Subcutaneous   Inject 3-15 Units into the skin 3 (three) times daily before meals. Sliding scale         . midodrine (PROAMATINE) 10 MG tablet   Oral   Take 10 mg by mouth every Monday, Wednesday, and Friday.          . pregabalin (LYRICA) 25 MG capsule   Oral   Take 25 mg by mouth 2 (two) times daily.         . sevelamer carbonate (RENVELA) 800 MG tablet   Oral   Take 1,600 mg by mouth 3 (three) times daily with meals.          BP 99/58  Pulse 51  Temp(Src) 98.1 F (36.7 C) (Oral)  Resp 24  SpO2 100%  Physical Exam  Nursing note and vitals reviewed. Constitutional: She is oriented to person, place, and time. She appears well-developed and well-nourished. No distress.  HENT:  Head: Normocephalic and atraumatic.  Mouth/Throat: Oropharynx is clear and moist.  Eyes: Conjunctivae and EOM are normal. Pupils are equal, round, and reactive to light.  Neck: Normal range of motion. Neck supple.  Cardiovascular: Normal heart sounds.  An irregularly irregular rhythm present. Tachycardia present.   Pulmonary/Chest: Effort normal and breath sounds normal. No respiratory distress. She has no wheezes.  Lungs CTAB  Abdominal: Soft. Bowel sounds are normal. There is no tenderness. There is no guarding.  Musculoskeletal:   Bilateral AKAs  Neurological: She is alert and oriented to person, place, and time.  Skin: Skin is warm and dry. She is not diaphoretic.  Psychiatric: She has  a normal mood and affect.    ED Course  Procedures (including critical care time)  CRITICAL CARE Performed by: Garlon Hatchet   Total critical care time: 30  Critical care time was exclusive of separately billable procedures and treating other patients.  Critical care was necessary to treat or prevent imminent or life-threatening deterioration.  Critical care was time spent personally by me on the following activities: development of treatment plan with patient and/or surrogate as well as nursing, discussions with consultants, evaluation of patient's response to treatment, examination of patient, obtaining history from patient or surrogate, ordering and performing treatments and interventions, ordering and review of laboratory studies, ordering and review of radiographic studies, pulse oximetry and re-evaluation of patient's condition.  Medications  HYDROmorphone (DILAUDID) injection 1 mg (not administered)  ondansetron (ZOFRAN) injection 4 mg (not administered)  acetaminophen (TYLENOL) tablet 650 mg (650 mg Oral Given 09/08/13 2211)    Or  acetaminophen (TYLENOL) suppository 650 mg ( Rectal See Alternative 09/08/13 2211)  oxyCODONE (Oxy IR/ROXICODONE) immediate release tablet 5 mg (5 mg Oral Given 09/08/13 2211)  HYDROmorphone (DILAUDID) injection 0.5-1 mg (not administered)  zolpidem (AMBIEN) tablet 5 mg (not administered)  ondansetron (ZOFRAN) tablet 4 mg (not administered)    Or  ondansetron (ZOFRAN) injection 4 mg (not administered)  sodium chloride 0.9 % injection 3 mL (3 mLs Intravenous Given 09/08/13 2200)  sodium chloride 0.9 % injection 3 mL (not administered)  0.9 %  sodium chloride infusion (not administered)  heparin ADULT infusion 100 units/mL (25000 units/250 mL) (1,200 Units/hr Intravenous New Bag/Given  09/08/13 2140)  atorvastatin (LIPITOR) tablet 10 mg (not administered)  B-complex with vitamin C tablet 1 tablet (not administered)  pregabalin (LYRICA) capsule 25 mg (25 mg Oral Given 09/08/13 2210)  sevelamer carbonate (RENVELA) tablet 1,600 mg (not administered)  cinacalcet (SENSIPAR) tablet 60 mg (not administered)  multivitamin (RENA-VIT) tablet 1 tablet (not administered)  midodrine (PROAMATINE) tablet 10 mg (not administered)  aspirin EC tablet 325 mg (not administered)  diltiazem (CARDIZEM) 100 mg in dextrose 5 % 100 mL infusion (5 mg/hr Intravenous New Bag/Given 09/08/13 1554)  morphine 4 MG/ML injection 4 mg (4 mg Intravenous Given 09/08/13 1830)    Labs Review Labs Reviewed  CBC - Abnormal; Notable for the following:    RBC 5.79 (*)    Hemoglobin 16.0 (*)    HCT 48.7 (*)    RDW 16.3 (*)    Platelets 127 (*)    All other components within normal limits  BASIC METABOLIC PANEL - Abnormal; Notable for the following:    Sodium 131 (*)    Chloride 88 (*)    Glucose, Bld 185 (*)    BUN 35 (*)    Creatinine, Ser 4.50 (*)    GFR calc non Af Amer 10 (*)    GFR calc Af Amer 11 (*)    All other components within normal limits  TROPONIN I  PRO B NATRIURETIC PEPTIDE   Imaging Review Dg Chest 2 View  09/08/2013   CLINICAL DATA:  Chest pain, shortness of breath, history coronary artery disease post CABG, diabetes, ischemic cardiomyopathy, on CPAP at night  EXAM: CHEST  2 VIEW  COMPARISON:  02/13/2013  FINDINGS: Enlargement of cardiac silhouette post CABG.  Slight pulmonary vascular congestion.  Mediastinal contours normal.  Peribronchial thickening with right basilar infiltrate question pneumonia.  Slight chronic accentuation of perihilar markings little changed from previous study.  No gross pleural effusion or pneumothorax.  No acute osseous  findings.  IMPRESSION: Enlargement of cardiac silhouette with pulmonary vascular congestion post CABG.  Bronchitic changes with right lower  lobe infiltrate question pneumonia.   Electronically Signed   By: Ulyses Southward M.D.   On: 09/08/2013 15:48    EKG Interpretation   None       MDM   1. Atrial fibrillation with RVR   2. CAD (coronary artery disease)   3. DM (diabetes mellitus), secondary, w/peripheral vascular complications   4. HYPERLIPIDEMIA   5. OBSTRUCTIVE SLEEP APNEA   6. ESRD on hemodialysis    EKG on arrival to ED with persistent AFIB with RVR-- this is new onset.  Pt given IV cardizem bolus via EMS without conversion, will start on cardizem drip.  Trop negative.  BNP elevated and CXR with some vascular congestion and question of pneumonia-- pt does not appear significantly fluid overloaded, afebrile and without current cough.  Discussed case with cardiology, Dr. Leeann Must-- advised he will consult and manage all cardiac components but prefers medical admission.  Consulted unassigned IM-- spoke with Dr. Lovell Sheehan- pt will be admitted to [facility] triad team 10, step-down.  Temporary admission orders placed.  VS stable for transfer to floor.  Garlon Hatchet, PA-C 09/08/13 2343

## 2013-09-08 NOTE — Consult Note (Signed)
Reason for Consult: atrial fibrillation + RVR  Referring Physician: Dr. Bernestine Amass TIERRAH ANASTOS is an 57 y.o. female.  HPI: Ms. Vantol is a 57 yo woman with PMH of CAD s/p CABG, dyslipidemia, T2DM, hypertension, ESRD on hemodialysis, prior TIA who presented to the ER with some right-sided chest pain on waking this AM and found to be in atrial fibrillation + RVR with fastest rate of 160. She tells me her biggest complaint is feeling "wiped out." She also notes some palpitations. She received 20 mg IV cardizem and gtt initiated with HR in 100s-110s currently. She had an admission with some atrial tachycardia previously and she is not currently on anticoagulation. Her last dialysis was yesterday with planned hemodialysis tomorrow. She characterized her CP as burning and originating in the substernal area. She has no fever, some chills, alternating constipation/diarrhea and her right leg had a blister pop (never issues before with initial amputation 2009) and now continued issues with some weeping. She is accompanied by family. She states they're going up on her dry weight at dialysis, no CP during dialysis but she runs low normal - 80s/90s systolics.     Past Medical History  Diagnosis Date  . Diabetes mellitus, type 2   . Secondary hyperparathyroidism   . OSA (obstructive sleep apnea)     NPSG 11.23.11-AHI 64.9/hr-central and obstructive apnea  . Ischemic cardiomyopathy     EF 25-30% 2011  . CAD (coronary artery disease)   . Pulmonary hypertension     Est PAsyst 60-Dr Mendel Ryder, off coumadin, CT neg for PE  . Hyperlipidemia   . Anemia   . Thyroid disease   . Pulmonary edema   . Peripheral vascular disease   . Sebaceous cyst   . 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 Velvet Bathe  . Irregular heart beat   . Shortness of breath     with exertion  . Hemodialysis patient     M,W,F  . TIA (transient ischemic attack) June 2014  . Ulcer of heel due to diabetes     left  . Kidney  disease Dialysis MWF    ESRD secondary to DM, failed transplant 2006. Dialysis    Past Surgical History  Procedure Laterality Date  . Coronary artery bypass graft  2011    3V  . Insertion of dialysis catheter    . 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 Nasal mucosal biopsy    . Ovarian cyst surgery    . 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 Laser retinal surgery  2011  . Kidney transplant      rejection  . Abdominal angiogram Left 01/25/13  . Colonoscopy    . Cardiac catheterization    . Eye surgery    . Femoral-popliteal bypass graft Left 02/20/2013    Procedure:  FEMORAL-to below knee POPLITEAL ARTERY bypasswith saphenous vein with intraoperative angiogram ;  Surgeon: Chuck Hint, MD;  Location: [facility] OR;  Service: Vascular;  Laterality: Left;  fem-below knee pop  . Amputation Left 03/27/2013    Procedure: AMPUTATION ABOVE KNEE;  Surgeon: Chuck Hint, MD;  Location: [facility] OR;  Service: Vascular;  Laterality: Left;    Family History  Problem Relation Age of Onset  . Heart disease    . Edema Father     fluid overload  . Cerebral aneurysm Mother   . Stroke Brother   . Heart disease Brother  Social History:  reports that she has never smoked. She has never used smokeless tobacco. She reports that she does not drink alcohol or use illicit drugs.  Allergies:  Allergies  Allergen Reactions  . Neurontin [Gabapentin] Anaphylaxis  . Ezetimibe-Simvastatin Other (See Comments)    REACTION: face edema; itching  . Klonopin [Clonazepam] Other (See Comments)    hallucination  . Sertraline Hcl Other (See Comments)    hallucination    Medications: I have reviewed the patient's current medications. Current Facility-Administered Medications  Medication Dose Route Frequency Provider Last Rate Last Dose  . HYDROmorphone (DILAUDID) injection 1 mg  1 mg Intravenous Q4H PRN Garlon Hatchet, PA-C      .  ondansetron [facility]) injection 4 mg  4 mg Intravenous Q8H PRN Garlon Hatchet, PA-C       Current Outpatient Prescriptions  Medication Sig Dispense Refill  . acetaminophen (TYLENOL) 325 MG tablet Take 650 mg by mouth every 6 (six) hours as needed for pain.       [name] aspirin 325 MG tablet Take 325 mg by mouth daily.      [name] atorvastatin (LIPITOR) 10 MG tablet Take 10 mg by mouth daily before breakfast.       . B Complex-C (B-COMPLEX WITH VITAMIN C) tablet Take 1 tablet by mouth daily.      . cinacalcet (SENSIPAR) 60 MG tablet Take 60 mg by mouth daily.       . folic acid-vitamin b complex-vitamin c-selenium-zinc (DIALYVITE) 3 MG TABS tablet Take 1 tablet by mouth daily.      [name] HYDROcodone-acetaminophen (NORCO/VICODIN) 5-325 MG per tablet Take 1 tablet by mouth every 6 (six) hours as needed for moderate pain.      [name] insulin aspart (NOVOLOG) 100 UNIT/ML injection Inject 3-15 Units into the skin 3 (three) times daily before meals. Sliding scale      . midodrine (PROAMATINE) 10 MG tablet Take 10 mg by mouth every Monday, Wednesday, and Friday.       . pregabalin (LYRICA) 25 MG capsule Take 25 mg by mouth 2 (two) times daily.      . sevelamer carbonate (RENVELA) 800 MG tablet Take 1,600 mg by mouth 3 (three) times daily with meals.        Prior to Admission:  (Not in a hospital admission) Scheduled: Continuous:  Results for orders placed during the hospital encounter of 09/08/13 (from the past 48 hour(s))  CBC     Status: Abnormal   Collection Time    09/08/13  3:46 PM      Result Value Range   WBC 9.6  4.0 - 10.5 K/uL   RBC 5.79 (*) 3.87 - 5.11 MIL/uL   Hemoglobin 16.0 (*) 12.0 - 15.0 g/dL   HCT 40.9 (*) 81.1 - 91.4 %   MCV 84.1  78.0 - 100.0 fL   MCH 27.6  26.0 - 34.0 pg   MCHC 32.9  30.0 - 36.0 g/dL   RDW 78.2 (*) 95.6 - 21.3 %   Platelets 127 (*) 150 - 400 K/uL  BASIC METABOLIC PANEL     Status: Abnormal   Collection Time    09/08/13  3:46 PM      Result Value Range   Sodium 131 (*)  135 - 145 mEq/L   Potassium 4.5  3.5 - 5.1 mEq/L   Chloride 88 (*) 96 - 112 mEq/L   CO2 32  19 - 32 mEq/L   Glucose, Bld 185 (*) 70 -  99 mg/dL   BUN 35 (*) 6 - 23 mg/dL   Creatinine, Ser 1.61 (*) 0.50 - 1.10 mg/dL   Calcium 9.5  8.4 - 09.6 mg/dL   GFR calc non Af Amer 10 (*) >90 mL/min   GFR calc Af Amer 11 (*) >90 mL/min   Comment: (NOTE)     The eGFR has been calculated using the CKD EPI equation.     This calculation has not been validated in all clinical situations.     eGFR's persistently <90 mL/min signify possible Chronic Kidney     Disease.  TROPONIN I     Status: None   Collection Time    09/08/13  3:46 PM      Result Value Range   Troponin I <0.30  <0.30 ng/mL   Comment:            Due to the release kinetics of cTnI,     a negative result within the first hours     of the onset of symptoms does not rule out     myocardial infarction with certainty.     If myocardial infarction is still suspected,     repeat the test at appropriate intervals.  PRO B NATRIURETIC PEPTIDE     Status: Abnormal   Collection Time    09/08/13  3:46 PM      Result Value Range   Pro B Natriuretic peptide (BNP) 32871.0 (*) 0 - 125 pg/mL    Dg Chest 2 View  09/08/2013   CLINICAL DATA:  Chest pain, shortness of breath, history coronary artery disease post CABG, diabetes, ischemic cardiomyopathy, on CPAP at night  EXAM: CHEST  2 VIEW  COMPARISON:  02/13/2013  FINDINGS: Enlargement of cardiac silhouette post CABG.  Slight pulmonary vascular congestion.  Mediastinal contours normal.  Peribronchial thickening with right basilar infiltrate question pneumonia.  Slight chronic accentuation of perihilar markings little changed from previous study.  No gross pleural effusion or pneumothorax.  No acute osseous findings.  IMPRESSION: Enlargement of cardiac silhouette with pulmonary vascular congestion post CABG.  Bronchitic changes with right lower lobe infiltrate question pneumonia.   Electronically Signed    By: Ulyses Southward M.D.   On: 09/08/2013 15:48    Review of Systems  Constitutional: Positive for chills and malaise/fatigue. Negative for fever and weight loss.  HENT: Negative for ear pain.   Eyes: Negative for double vision and pain.  Respiratory: Positive for shortness of breath. Negative for cough.   Cardiovascular: Positive for chest pain, palpitations and leg swelling.  Gastrointestinal: Positive for diarrhea and constipation. Negative for nausea, vomiting, blood in stool and melena.  Genitourinary: Negative.        No longer urinates  Musculoskeletal: Negative for myalgias and neck pain.       Right leg with new wound recently dressed; some weeping   Skin: Negative for rash.  Neurological: Positive for weakness. Negative for dizziness, tingling, tremors and headaches.  Endo/Heme/Allergies: Negative for environmental allergies and polydipsia.  Psychiatric/Behavioral: Negative for suicidal ideas, hallucinations and substance abuse.   Blood pressure 86/54, pulse 98, temperature 98.1 F (36.7 C), temperature source Oral, resp. rate 5, SpO2 93.00%. Physical Exam  Nursing note and vitals reviewed. Constitutional: She is oriented to person, place, and time. She appears well-developed and well-nourished. No distress.  HENT:  Head: Normocephalic and atraumatic.  Nose: Nose normal.  Mouth/Throat: Oropharynx is clear and moist. No oropharyngeal exudate.  Eyes: Conjunctivae and EOM are normal. Pupils are  equal, round, and reactive to light. No scleral icterus.  Neck: Normal range of motion. Neck supple. JVD present. No tracheal deviation present.  jVP to earlobes  Cardiovascular: Intact distal pulses.  Exam reveals no gallop.   Irregularly irregular; tachycardic ~ 100s  Respiratory: Effort normal and breath sounds normal. No respiratory distress. She has no wheezes. She has no rales.  GI: Soft. Bowel sounds are normal. She exhibits no distension. There is no tenderness. There is no  rebound.  Musculoskeletal: She exhibits edema.  Bilateral thigh edema  Neurological: She is alert and oriented to person, place, and time. No cranial nerve deficit.  Skin: Skin is warm and dry. She is not diaphoretic. No erythema.  Psychiatric: She has a normal mood and affect. Her behavior is normal. Judgment and thought content normal.   Labs reviewed; wbc 9.6, h/h 16/49, plt 127, na 131, K 4.5, bun/cr 35/4.5, glucose 185 proBNP 32.8k Trop <0.3 Chest x-ray: cardiomegaly with right LL opacity (looks similar to an old) and congestion bilaterally ECG: Atrial fibrillation ~ 100s 7/11 Echo results reviewed; EF 45-50%, grade I DD, dilated 4.4 cm LA  Problem List Atrial fibrillation + RVR ESRD on hemodialysis Elevated proBNP, vascular congestion T2DM with vascular disease, left AKA, right BKA Dyslipidemia OSA on CPAP CAD s/p prior CABG  Assessment/Plan: 57 yo woman with multiple medical issues here with right-sided chest pain and found to have atrial fibrillation + RVR. Differential diagnosis is afib triggered by infection, ischemia, volume overload, progressive heart failure, idiopathic, thyroid, toxins, among other etiologies. For now, agree with rate control as started by the ER with diltiazem gtt. We will need to evaluate her right leg closer.  CHADS2VASC = 5-6 so will start heparin gtt without bolus. Will consider TEE-DCCV Monday if she stays in atrial fibrillation. I discussed the plan at length with Ms. Formby.  - diltiazem gtt for rate control; heparin gtt for anticoagulation - continue atorvastatin  - would change aspirin to 81 mg daily - blood culture x2; wound care consult for right leg - Add on TSH - update echocardiogram - admit to telemetry, trend cardiac markers  Xolani Degracia 09/08/2013, 6:58 PM

## 2013-09-09 DIAGNOSIS — I059 Rheumatic mitral valve disease, unspecified: Secondary | ICD-10-CM

## 2013-09-09 LAB — CBC
HCT: 48.2 % — ABNORMAL HIGH (ref 36.0–46.0)
MCV: 83.4 fL (ref 78.0–100.0)
Platelets: 125 10*3/uL — ABNORMAL LOW (ref 150–400)
RBC: 5.78 MIL/uL — ABNORMAL HIGH (ref 3.87–5.11)
WBC: 7.5 10*3/uL (ref 4.0–10.5)

## 2013-09-09 LAB — BASIC METABOLIC PANEL
BUN: 41 mg/dL — ABNORMAL HIGH (ref 6–23)
CO2: 27 mEq/L (ref 19–32)
Calcium: 9.2 mg/dL (ref 8.4–10.5)
Creatinine, Ser: 4.9 mg/dL — ABNORMAL HIGH (ref 0.50–1.10)
GFR calc non Af Amer: 9 mL/min — ABNORMAL LOW (ref >90)
Glucose, Bld: 194 mg/dL — ABNORMAL HIGH (ref 70–99)

## 2013-09-09 LAB — TROPONIN I
Troponin I: <0.3 ng/mL (ref <0.30)
Troponin I: <0.3 ng/mL (ref <0.30)

## 2013-09-09 LAB — GLUCOSE, CAPILLARY: Glucose-Capillary: 171 mg/dL — ABNORMAL HIGH (ref 70–99)

## 2013-09-09 LAB — HEPARIN LEVEL (UNFRACTIONATED): Heparin Unfractionated: 0.27 IU/mL — ABNORMAL LOW (ref 0.30–0.70)

## 2013-09-09 MED ORDER — WARFARIN SODIUM 5 MG PO TABS
5.0000 mg | ORAL_TABLET | Freq: Once | ORAL | Status: AC
  Administered 2013-09-09: 5 mg via ORAL
  Filled 2013-09-09: qty 1

## 2013-09-09 MED ORDER — HEPARIN (PORCINE) IN NACL 100-0.45 UNIT/ML-% IJ SOLN
1450.0000 [IU]/h | INTRAMUSCULAR | Status: DC
  Administered 2013-09-09 – 2013-09-12 (×3): 1450 [IU]/h via INTRAVENOUS
  Filled 2013-09-09 (×9): qty 250

## 2013-09-09 MED ORDER — INSULIN ASPART 100 UNIT/ML ~~LOC~~ SOLN
0.0000 [IU] | Freq: Three times a day (TID) | SUBCUTANEOUS | Status: DC
  Administered 2013-09-09: 3 [IU] via SUBCUTANEOUS
  Administered 2013-09-10 – 2013-09-14 (×3): 2 [IU] via SUBCUTANEOUS

## 2013-09-09 MED ORDER — DILTIAZEM HCL 100 MG IV SOLR
5.0000 mg/h | INTRAVENOUS | Status: DC
  Administered 2013-09-09: 10 mg/h via INTRAVENOUS
  Administered 2013-09-09: 5 mg/h via INTRAVENOUS
  Administered 2013-09-10 – 2013-09-11 (×3): 10 mg/h via INTRAVENOUS
  Administered 2013-09-12: 5 mg/h via INTRAVENOUS
  Filled 2013-09-09: qty 100

## 2013-09-09 MED ORDER — WARFARIN - PHARMACIST DOSING INPATIENT
Freq: Every day | Status: DC
  Administered 2013-09-14 – 2013-09-16 (×3)

## 2013-09-09 MED ORDER — ASPIRIN EC 81 MG PO TBEC
81.0000 mg | DELAYED_RELEASE_TABLET | Freq: Every day | ORAL | Status: DC
  Administered 2013-09-10 – 2013-09-18 (×8): 81 mg via ORAL
  Filled 2013-09-09 (×9): qty 1

## 2013-09-09 MED ORDER — ALUM & MAG HYDROXIDE-SIMETH 200-200-20 MG/5ML PO SUSP: 15.0000 mL | Freq: Four times a day (QID) | ORAL | Status: DC | PRN

## 2013-09-09 MED ORDER — INSULIN ASPART 100 UNIT/ML ~~LOC~~ SOLN
3.0000 [IU] | Freq: Three times a day (TID) | SUBCUTANEOUS | Status: DC
  Administered 2013-09-09 – 2013-09-18 (×5): 3 [IU] via SUBCUTANEOUS

## 2013-09-09 MED ORDER — SILVER SULFADIAZINE 1 % EX CREA
TOPICAL_CREAM | Freq: Two times a day (BID) | CUTANEOUS | Status: DC
  Administered 2013-09-09: 1 via TOPICAL
  Administered 2013-09-10 – 2013-09-12 (×3): via TOPICAL
  Administered 2013-09-12: 1 via TOPICAL
  Administered 2013-09-13: 10:00:00 via TOPICAL
  Administered 2013-09-13: 1 via TOPICAL
  Administered 2013-09-14: 09:00:00 via TOPICAL
  Administered 2013-09-14 – 2013-09-15 (×2): 1 via TOPICAL
  Administered 2013-09-15 – 2013-09-17 (×4): via TOPICAL
  Administered 2013-09-18: 1 via TOPICAL
  Administered 2013-09-18: 11:00:00 via TOPICAL
  Filled 2013-09-09: qty 85

## 2013-09-09 MED ORDER — SILVER SULFADIAZINE 1 % EX CREA
TOPICAL_CREAM | Freq: Two times a day (BID) | CUTANEOUS | Status: DC
Start: 2013-09-09 — End: 2013-09-09
  Filled 2013-09-09: qty 85

## 2013-09-09 MED ORDER — HYDROXYZINE HCL 25 MG PO TABS
25.0000 mg | ORAL_TABLET | Freq: Three times a day (TID) | ORAL | Status: DC | PRN
  Administered 2013-09-09: 25 mg via ORAL
  Filled 2013-09-09: qty 1

## 2013-09-09 NOTE — Progress Notes (Signed)
Echo Lab  2D Echocardiogram completed.  Sarp Vernier L Laxmi Choung, RDCS 09/09/2013 8:38 AM

## 2013-09-09 NOTE — Progress Notes (Signed)
ANTICOAGULATION CONSULT NOTE - Follow Up Consult  Pharmacy Consult for heparin Indication: atrial fibrillation  Allergies  Allergen Reactions  . Neurontin [Gabapentin] Anaphylaxis  . Ezetimibe-Simvastatin Other (See Comments)    REACTION: face edema; itching  . Klonopin [Clonazepam] Other (See Comments)    hallucination  . Sertraline Hcl Other (See Comments)    hallucination    Patient Measurements: Height: 5\' 4"  (162.6 cm) Weight: 153 lb 10.6 oz (69.7 kg) IBW/kg (Calculated) : 54.7 Heparin Dosing Weight: 82.1 kg  Vital Signs: Temp: 98.6 F (37 C) (12/28 1128) Temp src: Oral (12/28 1128) BP: 101/83 mmHg (12/28 1300) Pulse Rate: 112 (12/28 1145)  Labs:  Recent Labs  09/08/13 1546 09/08/13 2231 09/09/13 0020 09/09/13 0235 09/09/13 0530 09/09/13 1419  HGB 16.0* 15.5*  --   --  16.1*  --   HCT 48.7* 46.0  --   --  48.2*  --   PLT 127* 122*  --   --  125*  --   APTT  --   --  61*  --   --   --   LABPROT  --   --  16.2*  --   --   --   INR  --   --  1.33  --   --   --   HEPARINUNFRC  --   --   --   --  0.16* 0.27*  CREATININE 4.50*  --   --   --  4.90*  --   TROPONINI <0.30 <0.30  --  <0.30  --   --     Estimated Creatinine Clearance: 12 ml/min (by C-G formula based on Cr of 4.9).   Medications:  Scheduled:  . [START ON 09/10/2013] aspirin EC  81 mg Oral Daily  . atorvastatin  10 mg Oral QAC breakfast  . B-complex with vitamin C  1 tablet Oral Daily  . cinacalcet  60 mg Oral Q breakfast  . insulin aspart  0-15 Units Subcutaneous TID WC  . insulin aspart  3 Units Subcutaneous TID WC  . [START ON 09/10/2013] midodrine  10 mg Oral Q M,W,F  . multivitamin  1 tablet Oral QHS  . pregabalin  25 mg Oral BID  . sevelamer carbonate  1,600 mg Oral TID WC  . sodium chloride  3 mL Intravenous Q12H  . warfarin  5 mg Oral ONCE-1800  . Warfarin - Pharmacist Dosing Inpatient   Does not apply q1800   Infusions:  . diltiazem (CARDIZEM) infusion 10 mg/hr (09/09/13 1300)   . heparin 1,350 Units/hr (09/09/13 1300)    Assessment: 57 yo female with afib is currently on subtherapeutic heparin.  Heparin level is 0.27.  Also on coumadin. Per RN, no problem with infusion. Goal of Therapy:  Heparin level 0.3-0.7 units/ml Monitor platelets by anticoagulation protocol: Yes   Plan:  1) Increase heparin to 1450 units/hr. No bolus per MD's request 2) Check an 8hr heparin level  Keonta Monceaux, Tsz-Yin 09/09/2013,3:00 PM

## 2013-09-09 NOTE — Progress Notes (Signed)
[facility] Progress Note [place] TEAM 1 - Stepdown/ICU TEAM   [name] WUJ:[id number] DOB: [birth year] DOA: 09/08/2013 PCP: Trevor Iha, MD  Admit HPI / Brief Narrative: 57 y.o. female with multiple medical problems including ESRD on HD who presented to the ED with complaints of right sided chest pain of acute onset. She reported having sharp pain in the right chest under her right breast area radiating into her back. She denied feeling any palpitations, but she did feel SOB. She was brought to the ED and was found to have new onset Atrial Fibrillation with RVR.   HPI/Subjective: Pt is resting comfortably.  She denies current chest pain.  She denies sob, f/c, n/v, or abdom pain.    Assessment/Plan:  Newly diagnosed afib w/ RVR Rate control remains tenuous, and has proven challenging due to hypotension - Cardiology following - heparin gtt - for DCCV tomorrow   DM2 Follow CBG w/o change today;  ESRD on HD M/W/F s/p failed transplant Care as per Nephrology team   OSA Cont home CPAP regimen  Ischemic CM - EF 25-30% Well compensated at present - volume managed per HD   Pulm HTN  PVD / Wound of Right BKA WOC RN to follow this chronic wound  HLD  SLE Does not appear to require chronic medical tx  Code Status: FULL Family Communication: no family present at time of exam Disposition Plan: SDU  Consultants: Cardiology Nephrology   Procedures: TTE - 12/28 - EF 30-35% - diffuse hypokinesis - RV systolic fxn severely reduced  Antibiotics: none  DVT prophylaxis: IV heparin   Objective: Blood pressure 106/60, pulse 114, temperature 98.6 F (37 C), temperature source Oral, resp. rate 18, height 5\' 4"  (1.626 m), weight 69.7 kg (153 lb 10.6 oz), SpO2 90.00%.  Intake/Output Summary (Last 24 hours) at 09/09/13 1220 Last data filed at 09/09/13 1000  Gross per 24 hour  Intake  584.3 ml  Output      0 ml  Net  584.3 ml   Exam: General: No acute  respiratory distress at rest  Lungs: distant bs th/o all fields - mild bibasilar crackles - no wheeze  Cardiovascular: irreg irreg w/o appreciable M Abdomen: Obese, nontender, nondistended, soft, bowel sounds positive, no rebound, no ascites, no appreciable mass Extremities: R BKA and L AKA - dressing on R LE wound clean and dry   Data Reviewed: Basic Metabolic Panel:  Recent Labs Lab 09/08/13 1546 09/09/13 0530  NA 131* 131*  K 4.5 4.9  CL 88* 90*  CO2 32 27  GLUCOSE 185* 194*  BUN 35* 41*  CREATININE 4.50* 4.90*  CALCIUM 9.5 9.2   Liver Function Tests: No results found for this basename: AST, ALT, ALKPHOS, BILITOT, PROT, ALBUMIN,  in the last 168 hours  CBC:  Recent Labs Lab 09/08/13 1546 09/08/13 2231 09/09/13 0530  WBC 9.6 9.5 7.5  HGB 16.0* 15.5* 16.1*  HCT 48.7* 46.0 48.2*  MCV 84.1 84.9 83.4  PLT 127* 122* 125*   Cardiac Enzymes:  Recent Labs Lab 09/08/13 1546 09/08/13 2231 09/09/13 0235  TROPONINI <0.30 <0.30 <0.30   BNP (last 3 results)  Recent Labs  09/08/13 1546  PROBNP 32871.0*   CBG: No results found for this basename: GLUCAP,  in the last 168 hours  Recent Results (from the past 240 hour(s))  MRSA PCR SCREENING     Status: None   Collection Time    09/08/13  9:30 PM      Result  Value Range Status   MRSA by PCR NEGATIVE  NEGATIVE Final   Comment:            The GeneXpert MRSA Assay (FDA     approved for NASAL specimens     only), is one component of a     comprehensive MRSA colonization     surveillance program. It is not     intended to diagnose MRSA     infection nor to guide or     monitor treatment for     MRSA infections.     Studies:  Recent x-ray studies have been reviewed in detail by the Attending Physician  Scheduled Meds:  Scheduled Meds: . [START ON 09/10/2013] aspirin EC  81 mg Oral Daily  . atorvastatin  10 mg Oral QAC breakfast  . B-complex with vitamin C  1 tablet Oral Daily  . cinacalcet  60 mg Oral Q  breakfast  . [START ON 09/10/2013] midodrine  10 mg Oral Q M,W,F  . multivitamin  1 tablet Oral QHS  . pregabalin  25 mg Oral BID  . sevelamer carbonate  1,600 mg Oral TID WC  . sodium chloride  3 mL Intravenous Q12H  . warfarin  5 mg Oral ONCE-1800  . Warfarin - Pharmacist Dosing Inpatient   Does not apply q1800    Time spent on care of this patient: 35 mins   [facility] T  [facility] Office  [phone] Pager - Text Page per Loretha Stapler as per below:  On-Call/Text Page:      [email or web address]      password [email or web address]  If 7PM-7AM, please contact night-coverage [email or web address] Password [email or web address] 09/09/2013, 12:20 PM   LOS: 1 day

## 2013-09-09 NOTE — Progress Notes (Signed)
    Subjective:  Denies dyspnea; chest pain improved.   Objective:  Filed Vitals:   09/09/13 0600 09/09/13 0700 09/09/13 0740 09/09/13 0800  BP: 86/49 103/56  77/34  Pulse:   114   Temp:   98.6 F (37 C)   TempSrc:   Oral   Resp: 15 20  16   Height:      Weight:      SpO2: 94% 90%      Intake/Output from previous day:  Intake/Output Summary (Last 24 hours) at 09/09/13 0940 Last data filed at 09/09/13 0800  Gross per 24 hour  Intake  277.3 ml  Output      0 ml  Net  277.3 ml    Physical Exam: Physical exam: Well-developed well-nourished in no acute distress.  Skin is warm and dry.  HEENT is normal.  Neck is supple.  Chest with mild basilar crackles. Cardiovascular exam is irregular and tachycardic Abdominal exam nontender or distended. No masses palpated. 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; 1+ edema on right; dressing in place neuro grossly intact    Lab Results: Basic Metabolic Panel:  Recent Labs  46/96/29 1546 09/09/13 0530  NA 131* 131*  K 4.5 4.9  CL 88* 90*  CO2 32 27  GLUCOSE 185* 194*  BUN 35* 41*  CREATININE 4.50* 4.90*  CALCIUM 9.5 9.2   CBC:  Recent Labs  09/08/13 2231 09/09/13 0530  WBC 9.5 7.5  HGB 15.5* 16.1*  HCT 46.0 48.2*  MCV 84.9 83.4  PLT 122* 125*   Cardiac Enzymes:  Recent Labs  09/08/13 1546 09/08/13 2231 09/09/13 0235  TROPONINI <0.30 <0.30 <0.30     Assessment/Plan:  1 atrial fibrillation-patient remains in atrial fibrillation with mildly elevated rate. Check TSH. Repeat echocardiogram. Will continue Cardizem for rate control. Continue heparin and begin Coumadin. Plan to proceed with TEE guided cardioversion tomorrow. If she did not hold sinus rhythm she will most likely need amiodarone as her blood pressure is borderline and we have little room to increase AV nodal blocking agents. 2 chest pain-symptoms extremely atypical. Enzymes negative. Would not pursue further ischemia evaluation. There is increased with  certain movements. Question musculoskeletal. 3 chronic end-stage renal disease-management per nephrology. 4 peripheral vascular disease status post right BKA-wound care should follow. 5 ischemic cardiomyopathy-continue statin. Add low-dose beta blocker later when she is in sinus rhythm and Cardizem can be discontinued. Medications will be limited by blood pressure. 6 hyperlipidemia-continue statin.  Olga Millers 09/09/2013, 9:40 AM

## 2013-09-09 NOTE — Progress Notes (Signed)
ANTICOAGULATION CONSULT NOTE - Initial Consult  Pharmacy Consult for coumadin Indication: atrial fibrillation  Allergies  Allergen Reactions  . Neurontin [Gabapentin] Anaphylaxis  . Ezetimibe-Simvastatin Other (See Comments)    REACTION: face edema; itching  . Klonopin [Clonazepam] Other (See Comments)    hallucination  . Sertraline Hcl Other (See Comments)    hallucination    Patient Measurements: Height: 5\' 4"  (162.6 cm) Weight: 153 lb 10.6 oz (69.7 kg) IBW/kg (Calculated) : 54.7 Heparin Dosing Weight:   Vital Signs: Temp: 98.6 F (37 C) (12/28 0740) Temp src: Oral (12/28 0740) BP: 77/34 mmHg (12/28 0800) Pulse Rate: 114 (12/28 0740)  Labs:  Recent Labs  09/08/13 1546 09/08/13 2231 09/09/13 0020 09/09/13 0235 09/09/13 0530  HGB 16.0* 15.5*  --   --  16.1*  HCT 48.7* 46.0  --   --  48.2*  PLT 127* 122*  --   --  125*  APTT  --   --  61*  --   --   LABPROT  --   --  16.2*  --   --   INR  --   --  1.33  --   --   HEPARINUNFRC  --   --   --   --  0.16*  CREATININE 4.50*  --   --   --  4.90*  TROPONINI <0.30 <0.30  --  <0.30  --     Estimated Creatinine Clearance: 12 ml/min (by C-G formula based on Cr of 4.9).   Medical History: Past Medical History  Diagnosis Date  . Diabetes mellitus, type 2   . Secondary hyperparathyroidism   . OSA (obstructive sleep apnea)     NPSG 11.23.11-AHI 64.9/hr-central and obstructive apnea  . Ischemic cardiomyopathy     EF 25-30% 2011  . CAD (coronary artery disease)   . Pulmonary hypertension     Est PAsyst 60-Dr Mendel Ryder, off coumadin, CT neg for PE  . Hyperlipidemia   . Anemia   . Thyroid disease   . Pulmonary edema   . Peripheral vascular disease   . Sebaceous cyst   . 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 Velvet Bathe  . Irregular heart beat   . Shortness of breath     with exertion  . Hemodialysis patient     M,W,F  . TIA (transient ischemic attack) June 2014  . Ulcer of heel due to diabetes     left  .  Kidney disease Dialysis MWF    ESRD secondary to DM, failed transplant 2006. Dialysis    Medications:  Scheduled:  . [START ON 09/10/2013] aspirin EC  81 mg Oral Daily  . atorvastatin  10 mg Oral QAC breakfast  . B-complex with vitamin C  1 tablet Oral Daily  . cinacalcet  60 mg Oral Q breakfast  . [START ON 09/10/2013] midodrine  10 mg Oral Q M,W,F  . multivitamin  1 tablet Oral QHS  . pregabalin  25 mg Oral BID  . sevelamer carbonate  1,600 mg Oral TID WC  . sodium chloride  3 mL Intravenous Q12H   Infusions:  . diltiazem (CARDIZEM) infusion 5 mg/hr (09/09/13 0440)  . heparin 1,350 Units/hr (09/09/13 0715)    Assessment: 57 yo female with afib will be started on coumadin therapy. Baseline INR 1.33.  Coumadin score = 4.  Patient is also on heparin drip. Goal of Therapy:  INR 2-3 Monitor platelets by anticoagulation protocol: Yes   Plan:  1) Coumadin 5mg  po  x1 2) Daily PT/INR 3) D/c heparin when INR >2  Vinnie Gombert, Tsz-Yin 09/09/2013,9:54 AM

## 2013-09-09 NOTE — Consult Note (Signed)
WOC wound consult note Reason for Consult: medial right stump wound (chronic).  Patient states she wears prosthesis and that blister appeared about 2 months ago.  It ruptures and she has had to continue wearing the prosthesis so that she can be mobile. Wound type: full thickness thickness tissue loss initially presenting as a blister (serum filled), but progressing to full thickness due to pressure from prosthesis Pressure Ulcer POA: Yes (medical device related) Measurement: 6cm x 6cm x 0.2cm.  75% clean, pink and moist.  25% soft yellow eschar. Wound bed:As above Drainage (amount, consistency, odor) Light yellow with mild odor.  Dressing not changed since yesterday Periwound:intact.  Rest of incision is well approximated, healed and scar has remodeled. Dressing procedure/placement/frequency:I will implement a silvadene dressing twice daily for two weeks to provide antimicrobial coverage to this area that has been open for two months.  Recommend consideration of referral to outpatient source for refitting of prosthesis. WOC nursing team will not follow, but will remain available to this patient, the nursing and medical teams.  Please re-consult if needed. Thanks, Ladona Mow, MSN, RN, GNP, [place], CWON-AP [phone])

## 2013-09-09 NOTE — Progress Notes (Signed)
ANTICOAGULATION CONSULT NOTE - Follow-Up Consult  Pharmacy Consult for Heparin Indication: atrial fibrillation  Allergies  Allergen Reactions  . Neurontin [Gabapentin] Anaphylaxis  . Ezetimibe-Simvastatin Other (See Comments)    REACTION: face edema; itching  . Klonopin [Clonazepam] Other (See Comments)    hallucination  . Sertraline Hcl Other (See Comments)    hallucination    Patient Measurements: Height: 5\' 4"  (162.6 cm) Weight: 153 lb 10.6 oz (69.7 kg) IBW/kg (Calculated) : 54.7 Weight 82.1 kg  Vital Signs: Temp: 97.5 F (36.4 C) (12/28 0334) Temp src: Oral (12/28 0334) BP: 86/49 mmHg (12/28 0600) Pulse Rate: 94 (12/27 2200)  Labs:  Recent Labs  09/08/13 1546 09/08/13 2231 09/09/13 0020 09/09/13 0235 09/09/13 0530  HGB 16.0* 15.5*  --   --  16.1*  HCT 48.7* 46.0  --   --  48.2*  PLT 127* 122*  --   --  125*  APTT  --   --  61*  --   --   LABPROT  --   --  16.2*  --   --   INR  --   --  1.33  --   --   HEPARINUNFRC  --   --   --   --  0.16*  CREATININE 4.50*  --   --   --  4.90*  TROPONINI <0.30 <0.30  --  <0.30  --     Estimated Creatinine Clearance: 12 ml/min (by C-G formula based on Cr of 4.9).   Medical History: Past Medical History  Diagnosis Date  . Diabetes mellitus, type 2   . Secondary hyperparathyroidism   . OSA (obstructive sleep apnea)     NPSG 11.23.11-AHI 64.9/hr-central and obstructive apnea  . Ischemic cardiomyopathy     EF 25-30% 2011  . CAD (coronary artery disease)   . Pulmonary hypertension     Est PAsyst 60-Dr Mendel Ryder, off coumadin, CT neg for PE  . Hyperlipidemia   . Anemia   . Thyroid disease   . Pulmonary edema   . Peripheral vascular disease   . Sebaceous cyst   . 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 Velvet Bathe  . Irregular heart beat   . Shortness of breath     with exertion  . Hemodialysis patient     M,W,F  . TIA (transient ischemic attack) June 2014  . Ulcer of heel due to diabetes     left  . Kidney  disease Dialysis MWF    ESRD secondary to DM, failed transplant 2006. Dialysis    Medications:  Infusions:  . diltiazem (CARDIZEM) infusion 5 mg/hr (09/09/13 0440)  . heparin      Assessment: 57 year old female admitted with new-onset atrial fibrillation to begin anticoagulation with Heparin.  Note she is HD-dependent.  Initial heparin level subtherapeutic at 0.16.  Per RN, no issues with infusion overnight.  CBC/Pltc stable.  Goal of Therapy:  Heparin level 0.3-0.7 units/ml Monitor platelets by anticoagulation protocol: Yes   Plan:  Per Cardiology note - no Heparin bolus requested  Increase IV heparin gtt to 1350 units/hr. Recheck heparin level in 8 hrs. Continue daily heparin level and CBC.  Tad Moore, BCPS  Clinical Pharmacist Pager [phone]  09/09/2013 7:04 AM

## 2013-09-09 NOTE — Progress Notes (Addendum)
ANTICOAGULATION CONSULT NOTE - Follow Up Consult  Pharmacy Consult for Heparin  Indication: atrial fibrillation  Allergies  Allergen Reactions  . Neurontin [Gabapentin] Anaphylaxis  . Ezetimibe-Simvastatin Other (See Comments)    REACTION: face edema; itching  . Klonopin [Clonazepam] Other (See Comments)    hallucination  . Sertraline Hcl Other (See Comments)    hallucination    Patient Measurements: Height: 5\' 4"  (162.6 cm) Weight: 153 lb 10.6 oz (69.7 kg) IBW/kg (Calculated) : 54.7  Vital Signs: Temp: 98.8 F (37.1 C) (12/28 2004) Temp src: Oral (12/28 2004) BP: 101/57 mmHg (12/28 2300)  Labs:  Recent Labs  09/08/13 1546 09/08/13 2231 09/09/13 0020 09/09/13 0235 09/09/13 0530 09/09/13 1419 09/09/13 1420 09/09/13 2330  HGB 16.0* 15.5*  --   --  16.1*  --   --   --   HCT 48.7* 46.0  --   --  48.2*  --   --   --   PLT 127* 122*  --   --  125*  --   --   --   APTT  --   --  61*  --   --   --   --   --   LABPROT  --   --  16.2*  --   --   --   --   --   INR  --   --  1.33  --   --   --   --   --   HEPARINUNFRC  --   --   --   --  0.16* 0.27*  --  0.44  CREATININE 4.50*  --   --   --  4.90*  --   --   --   TROPONINI <0.30 <0.30  --  <0.30  --   --  <0.30  --     Estimated Creatinine Clearance: 12 ml/min (by C-G formula based on Cr of 4.9).   Medications:  Heparin 1450 units/hr  Assessment: 57 y/o F on heparin for afib. HL is 0.44 after rate increase. Other labs as above.   Goal of Therapy:  Heparin level 0.3-0.7 units/ml Monitor platelets by anticoagulation protocol: Yes   Plan:  -Continue heparin at 1450 units/hr -F/U HL with AM labs -Daily CBC/HL -Monitor for bleeding  Thank you for allowing me to take part in this patient's care,  Abran Duke, PharmD Clinical Pharmacist Phone: [phone] Pager: [phone] 09/10/2013 12:01 AM  09/10/2013 6:23 AM Heparin level this AM is 0.42, continue heparin at 1450 units/hr, Check daily CBC/HL JLedford,  PharmD

## 2013-09-10 DIAGNOSIS — I2589 Other forms of chronic ischemic heart disease: Secondary | ICD-10-CM

## 2013-09-10 LAB — CBC
Hemoglobin: 16.2 g/dL — ABNORMAL HIGH (ref 12.0–15.0)
MCH: 27.6 pg (ref 26.0–34.0)
MCHC: 33.4 g/dL (ref 30.0–36.0)
MCV: 82.5 fL (ref 78.0–100.0)
Platelets: 123 10*3/uL — ABNORMAL LOW (ref 150–400)
RDW: 16.4 % — ABNORMAL HIGH (ref 11.5–15.5)
WBC: 6.8 10*3/uL (ref 4.0–10.5)

## 2013-09-10 LAB — RENAL FUNCTION PANEL
Albumin: 2 g/dL — ABNORMAL LOW (ref 3.5–5.2)
CO2: 25 mEq/L (ref 19–32)
Chloride: 87 mEq/L — ABNORMAL LOW (ref 96–112)
GFR calc Af Amer: 8 mL/min — ABNORMAL LOW (ref >90)
Glucose, Bld: 142 mg/dL — ABNORMAL HIGH (ref 70–99)
Phosphorus: 6.3 mg/dL — ABNORMAL HIGH (ref 2.3–4.6)
Potassium: 5.3 mEq/L — ABNORMAL HIGH (ref 3.5–5.1)
Sodium: 128 mEq/L — ABNORMAL LOW (ref 135–145)

## 2013-09-10 LAB — GLUCOSE, CAPILLARY
Glucose-Capillary: 148 mg/dL — ABNORMAL HIGH (ref 70–99)
Glucose-Capillary: 150 mg/dL — ABNORMAL HIGH (ref 70–99)
Glucose-Capillary: 101 mg/dL — ABNORMAL HIGH (ref 70–99)

## 2013-09-10 MED ORDER — SODIUM CHLORIDE 0.9 % IV SOLN
INTRAVENOUS | Status: DC
  Administered 2013-09-11: 10 mL/h via INTRAVENOUS
  Administered 2013-09-13: 20 mL/h via INTRAVENOUS

## 2013-09-10 MED ORDER — WARFARIN VIDEO: Freq: Once | Status: DC

## 2013-09-10 MED ORDER — COUMADIN BOOK
Freq: Once | Status: AC
  Administered 2013-09-10: 13:00:00
  Filled 2013-09-10: qty 1

## 2013-09-10 MED ORDER — BOOST / RESOURCE BREEZE PO LIQD
1.0000 | Freq: Three times a day (TID) | ORAL | Status: DC
  Administered 2013-09-10 – 2013-09-18 (×10): 1 via ORAL

## 2013-09-10 MED ORDER — WARFARIN SODIUM 5 MG PO TABS
5.0000 mg | ORAL_TABLET | Freq: Once | ORAL | Status: AC
  Filled 2013-09-10: qty 1

## 2013-09-10 NOTE — Care Management Note (Signed)
    Page 1 of 1   09/10/2013     11:25:32 AM   CARE MANAGEMENT NOTE 09/10/2013  Patient:  ALOMA, BOCH   Account Number:  [id number]  Date Initiated:  09/10/2013  Documentation initiated by:  Junius Creamer  Subjective/Objective Assessment:   adm w at fib     Action/Plan:   lives alone, pcp dr Fayrene Fearing deterding   Anticipated DC Date:     Anticipated DC Plan:           Choice offered to / List presented to:             Status of service:   Medicare Important Message given?   (If response is "NO", the following Medicare IM given date fields will be blank) Date Medicare IM given:   Date Additional Medicare IM given:    Discharge Disposition:    Per UR Regulation:  Reviewed for med. necessity/level of care/duration of stay  If discussed at Long Length of Stay Meetings, dates discussed:    Comments:

## 2013-09-10 NOTE — Progress Notes (Signed)
    Subjective:  Denies dyspnea; chest pain improved.   Objective:  Filed Vitals:   09/10/13 0500 09/10/13 0700 09/10/13 0754 09/10/13 0800  BP: 109/53 102/57 102/57 102/62  Pulse:   97   Temp:   98.7 F (37.1 C)   TempSrc:   Oral   Resp: 20   22  Height:      Weight:      SpO2: 92% 96% 94% 93%    Intake/Output from previous day:  Intake/Output Summary (Last 24 hours) at 09/10/13 0851 Last data filed at 09/10/13 0600  Gross per 24 hour  Intake 1205.77 ml  Output      0 ml  Net 1205.77 ml    Physical Exam: Physical exam: Well-developed well-nourished in no acute distress.  Skin is warm and dry.  HEENT is normal.  Neck is supple.  Chest with mild basilar crackles. Cardiovascular exam is irregular and tachycardic Abdominal exam nontender or distended. No masses palpated. 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; 1+ edema on right; dressing in place neuro grossly intact    Lab Results: Basic Metabolic Panel:  Recent Labs  16/10/96 0530 09/10/13 0450  NA 131* 128*  K 4.9 5.3*  CL 90* 87*  CO2 27 25  GLUCOSE 194* 142*  BUN 41* 47*  CREATININE 4.90* 5.79*  CALCIUM 9.2 9.0  PHOS  --  6.3*   CBC:  Recent Labs  09/09/13 0530 09/10/13 0450  WBC 7.5 6.8  HGB 16.1* 16.2*  HCT 48.2* 48.5*  MCV 83.4 82.5  PLT 125* 123*   Cardiac Enzymes:  Recent Labs  09/08/13 2231 09/09/13 0235 09/09/13 1420  TROPONINI <0.30 <0.30 <0.30     Assessment/Plan:  1 atrial fibrillation-patient remains in atrial fibrillation with mildly elevated rate. Check TSH.Will continue Cardizem for rate control. Continue heparin and begin Coumadin. Plan to proceed with TEE guided cardioversion today, unless dialysis needed.  If dialysis planned, could do TEE/CV tomorrow. If she did not hold sinus rhythm she will most likely need amiodarone as her blood pressure is borderline and we have little room to increase AV nodal blocking agents. 2 chest pain-symptoms extremely atypical. Enzymes  negative. Would not pursue further ischemia evaluation. There is increased with certain movements. Question musculoskeletal. 3 chronic end-stage renal disease-management per nephrology. 4 peripheral vascular disease status post right BKA-wound care should follow. 5 ischemic cardiomyopathy-continue statin. Add low-dose beta blocker later when she is in sinus rhythm and Cardizem can be discontinued. Medications will be limited by blood pressure. 6 hyperlipidemia-continue statin.  Ajaya Crutchfield S. 09/10/2013, 8:51 AM

## 2013-09-10 NOTE — Progress Notes (Signed)
Pt. Refused cpap. Pt. States she was going to have family bring her cpap to her but she forgot. Pt. States she will wait for her own cpap. RT informed pt. To notify if she changes her mind and wants to wear a cpap.

## 2013-09-10 NOTE — Procedures (Signed)
I was present at this dialysis session, have reviewed the session itself and made  appropriate changes  Vinson Moselle MD (pgr) [phone]    ([phone] 09/10/2013, 6:10 PM

## 2013-09-10 NOTE — Progress Notes (Signed)
Pharmacist Heart Failure Core Measure Documentation  Assessment: [name] has an EF documented as 30% - 35% on 09/09/13 by Echo.  Rationale: Heart failure patients with left ventricular systolic dysfunction (LVSD) and an EF < 40% should be prescribed an angiotensin converting enzyme inhibitor (ACEI) or angiotensin receptor blocker (ARB) at discharge unless a contraindication is documented in the medical record.  This patient is not currently on an ACEI or ARB for HF.  This note is being placed in the record in order to provide documentation that a contraindication to the use of these agents is present for this encounter.  ACE Inhibitor or Angiotensin Receptor Blocker is contraindicated (specify all that apply)  []   ACEI allergy AND ARB allergy []   Angioedema []   Moderate or severe aortic stenosis []   Hyperkalemia [x]   Hypotension []   Renal artery stenosis []   Worsening renal function, preexisting renal disease or dysfunction  Harland German, Pharm D 09/10/2013 2:40 PM

## 2013-09-10 NOTE — Consult Note (Signed)
[facility] Renal Consultation Note  Indication for Consultation:  Management of ESRD/hemodialysis; anemia, hypertension/volume and secondary hyperparathyroidism  HPI: [name] is a 57 y.o. female with ESRD MWF HD ([facility] )  , and ho CAD s/p CABG, dyslipidemia, T2DM, hypertension, prior TIA who presented to the ER am of 09/08/13 with  right-sided chest pain when  Awakening from sleep  and found to be in atrial fibrillation + RVR with fastest rate of 160. She tells me her biggest complaint of  Feeling weak and "wiped out.", and also  notes some palpitations. Given  20 mg IV cardizem and gtt initiated with HR in 100s-110s . She had an admission with some atrial tachycardia previously and she is not currently on anticoagulation. Her last dialysis was 09/07/13 and she left near her edw at 85.5 ( 86.5post wt.) Her OP holiday hd schedule is Friday / Sunday / Tues. She Missed  HD yesterday.She denies  any fever, some chills, alternating constipation/diarrhea and her right leg had a blister pop at site of BKA and has required daily dressings.She was living alone at home despite L AKA and R BKA and has  family in the area that help her    Past Medical History  Diagnosis Date  . Diabetes mellitus, type 2   . Secondary hyperparathyroidism   . OSA (obstructive sleep apnea)     NPSG 11.23.11-AHI 64.9/hr-central and obstructive apnea  . Ischemic cardiomyopathy     EF 25-30% 2011  . CAD (coronary artery disease)   . Pulmonary hypertension     Est PAsyst 60-Dr Mendel Ryder, off coumadin, CT neg for PE  . Hyperlipidemia   . Anemia   . Thyroid disease   . Pulmonary edema   . Peripheral vascular disease   . Sebaceous cyst   . 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 Velvet Bathe  . Irregular heart beat   . Shortness of breath     with exertion  . Hemodialysis patient     M,W,F  . TIA (transient ischemic attack) June 2014  . Ulcer of heel due to diabetes     left  . Kidney disease Dialysis  MWF    ESRD secondary to DM, failed transplant 2006. Dialysis    Past Surgical History  Procedure Laterality Date  . Coronary artery bypass graft  2011    3V  . Insertion of dialysis catheter    . 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 Nasal mucosal biopsy    . Ovarian cyst surgery    . 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 Laser retinal surgery  2011  . Kidney transplant      rejection  . Abdominal angiogram Left 01/25/13  . Colonoscopy    . Cardiac catheterization    . Eye surgery    . Femoral-popliteal bypass graft Left 02/20/2013    Procedure:  FEMORAL-to below knee POPLITEAL ARTERY bypasswith saphenous vein with intraoperative angiogram ;  Surgeon: Chuck Hint, MD;  Location: [facility] OR;  Service: Vascular;  Laterality: Left;  fem-below knee pop  . Amputation Left 03/27/2013    Procedure: AMPUTATION ABOVE KNEE;  Surgeon: Chuck Hint, MD;  Location: [facility] OR;  Service: Vascular;  Laterality: Left;      Family History  Problem Relation Age of Onset  . Heart disease    . Edema Father     fluid overload  . Cerebral aneurysm Mother   .  Stroke Brother   . Heart disease Brother       reports that she has never smoked. She has never used smokeless tobacco. She reports that she does not drink alcohol or use illicit drugs.   Allergies  Allergen Reactions  . Neurontin [Gabapentin] Anaphylaxis  . Ezetimibe-Simvastatin Other (See Comments)    REACTION: face edema; itching  . Klonopin [Clonazepam] Other (See Comments)    hallucination  . Sertraline Hcl Other (See Comments)    hallucination    Prior to Admission medications   Medication Sig Start Date End Date Taking? Authorizing Provider  acetaminophen (TYLENOL) 325 MG tablet Take 650 mg by mouth every 6 (six) hours as needed for pain.    Yes Historical Provider, MD  aspirin 325 MG tablet Take 325 mg by mouth daily.   Yes Historical Provider, MD   atorvastatin (LIPITOR) 10 MG tablet Take 10 mg by mouth daily before breakfast.    Yes Historical Provider, MD  B Complex-C (B-COMPLEX WITH VITAMIN C) tablet Take 1 tablet by mouth daily.   Yes Historical Provider, MD  cinacalcet (SENSIPAR) 60 MG tablet Take 60 mg by mouth daily.    Yes Historical Provider, MD  folic acid-vitamin b complex-vitamin c-selenium-zinc (DIALYVITE) 3 MG TABS tablet Take 1 tablet by mouth daily.   Yes Historical Provider, MD  HYDROcodone-acetaminophen (NORCO/VICODIN) 5-325 MG per tablet Take 1 tablet by mouth every 6 (six) hours as needed for moderate pain.   Yes Historical Provider, MD  insulin aspart (NOVOLOG) 100 UNIT/ML injection Inject 3-15 Units into the skin 3 (three) times daily before meals. Sliding scale   Yes Historical Provider, MD  midodrine (PROAMATINE) 10 MG tablet Take 10 mg by mouth every Monday, Wednesday, and Friday.    Yes Historical Provider, MD  pregabalin (LYRICA) 25 MG capsule Take 25 mg by mouth 2 (two) times daily. 05/23/13  Yes Trevor Iha, MD  sevelamer carbonate (RENVELA) 800 MG tablet Take 1,600 mg by mouth 3 (three) times daily with meals.   Yes Historical Provider, MD     Anti-infectives   None      Results for orders placed during the hospital encounter of 09/08/13 (from the past 48 hour(s))  CBC     Status: Abnormal   Collection Time    09/08/13  3:46 PM      Result Value Range   WBC 9.6  4.0 - 10.5 K/uL   RBC 5.79 (*) 3.87 - 5.11 MIL/uL   Hemoglobin 16.0 (*) 12.0 - 15.0 g/dL   HCT 95.6 (*) 21.3 - 08.6 %   MCV 84.1  78.0 - 100.0 fL   MCH 27.6  26.0 - 34.0 pg   MCHC 32.9  30.0 - 36.0 g/dL   RDW 57.8 (*) 46.9 - 62.9 %   Platelets 127 (*) 150 - 400 K/uL  BASIC METABOLIC PANEL     Status: Abnormal   Collection Time    09/08/13  3:46 PM      Result Value Range   Sodium 131 (*) 135 - 145 mEq/L   Potassium 4.5  3.5 - 5.1 mEq/L   Chloride 88 (*) 96 - 112 mEq/L   CO2 32  19 - 32 mEq/L   Glucose, Bld 185 (*) 70 - 99 mg/dL    BUN 35 (*) 6 - 23 mg/dL   Creatinine, Ser 5.28 (*) 0.50 - 1.10 mg/dL   Calcium 9.5  8.4 - 41.3 mg/dL   GFR calc non Af Amer 10 (*) >  90 mL/min   GFR calc Af Amer 11 (*) >90 mL/min   Comment: (NOTE)     The eGFR has been calculated using the CKD EPI equation.     This calculation has not been validated in all clinical situations.     eGFR's persistently <90 mL/min signify possible Chronic Kidney     Disease.  TROPONIN I     Status: None   Collection Time    09/08/13  3:46 PM      Result Value Range   Troponin I <0.30  <0.30 ng/mL   Comment:            Due to the release kinetics of cTnI,     a negative result within the first hours     of the onset of symptoms does not rule out     myocardial infarction with certainty.     If myocardial infarction is still suspected,     repeat the test at appropriate intervals.  PRO B NATRIURETIC PEPTIDE     Status: Abnormal   Collection Time    09/08/13  3:46 PM      Result Value Range   Pro B Natriuretic peptide (BNP) 32871.0 (*) 0 - 125 pg/mL  MRSA PCR SCREENING     Status: None   Collection Time    09/08/13  9:30 PM      Result Value Range   MRSA by PCR NEGATIVE  NEGATIVE   Comment:            The GeneXpert MRSA Assay (FDA     approved for NASAL specimens     only), is one component of a     comprehensive MRSA colonization     surveillance program. It is not     intended to diagnose MRSA     infection nor to guide or     monitor treatment for     MRSA infections.  CBC     Status: Abnormal   Collection Time    09/08/13 10:31 PM      Result Value Range   WBC 9.5  4.0 - 10.5 K/uL   RBC 5.42 (*) 3.87 - 5.11 MIL/uL   Hemoglobin 15.5 (*) 12.0 - 15.0 g/dL   HCT 96.0  45.4 - 09.8 %   MCV 84.9  78.0 - 100.0 fL   MCH 28.6  26.0 - 34.0 pg   MCHC 33.7  30.0 - 36.0 g/dL   RDW 11.9 (*) 14.7 - 82.9 %   Platelets 122 (*) 150 - 400 K/uL  TROPONIN I     Status: None   Collection Time    09/08/13 10:31 PM      Result Value Range    Troponin I <0.30  <0.30 ng/mL   Comment:            Due to the release kinetics of cTnI,     a negative result within the first hours     of the onset of symptoms does not rule out     myocardial infarction with certainty.     If myocardial infarction is still suspected,     repeat the test at appropriate intervals.  CULTURE, BLOOD (ROUTINE X 2)     Status: None   Collection Time    09/08/13 11:53 PM      Result Value Range   Specimen Description BLOOD LEFT ARM     Special Requests BOTTLES DRAWN AEROBIC ONLY 10CC  Culture  Setup Time       Value: 09/09/2013 14:18     Performed at [facility]   Culture       Value:        BLOOD CULTURE RECEIVED NO GROWTH TO DATE CULTURE WILL BE HELD FOR 5 DAYS BEFORE ISSUING A FINAL NEGATIVE REPORT     Performed at [facility]   Report Status PENDING    CULTURE, BLOOD (ROUTINE X 2)     Status: None   Collection Time    09/08/13 11:59 PM      Result Value Range   Specimen Description BLOOD LEFT HAND     Special Requests BOTTLES DRAWN AEROBIC ONLY 8CC     Culture  Setup Time       Value: 09/09/2013 14:18     Performed at [facility]   Culture       Value:        BLOOD CULTURE RECEIVED NO GROWTH TO DATE CULTURE WILL BE HELD FOR 5 DAYS BEFORE ISSUING A FINAL NEGATIVE REPORT     Performed at [facility]   Report Status PENDING    PROTIME-INR     Status: Abnormal   Collection Time    09/09/13 12:20 AM      Result Value Range   Prothrombin Time 16.2 (*) 11.6 - 15.2 seconds   INR 1.33  0.00 - 1.49  APTT     Status: Abnormal   Collection Time    09/09/13 12:20 AM      Result Value Range   aPTT 61 (*) 24 - 37 seconds   Comment:            IF BASELINE aPTT IS ELEVATED,     SUGGEST PATIENT RISK ASSESSMENT     BE USED TO DETERMINE APPROPRIATE     ANTICOAGULANT THERAPY.  TROPONIN I     Status: None   Collection Time    09/09/13  2:35 AM      Result Value Range   Troponin I <0.30  <0.30 ng/mL   Comment:             Due to the release kinetics of cTnI,     a negative result within the first hours     of the onset of symptoms does not rule out     myocardial infarction with certainty.     If myocardial infarction is still suspected,     repeat the test at appropriate intervals.  HEPARIN LEVEL (UNFRACTIONATED)     Status: Abnormal   Collection Time    09/09/13  5:30 AM      Result Value Range   Heparin Unfractionated 0.16 (*) 0.30 - 0.70 IU/mL   Comment:            IF HEPARIN RESULTS ARE BELOW     EXPECTED VALUES, AND PATIENT     DOSAGE HAS BEEN CONFIRMED,     SUGGEST FOLLOW UP TESTING     OF ANTITHROMBIN III LEVELS.  CBC     Status: Abnormal   Collection Time    09/09/13  5:30 AM      Result Value Range   WBC 7.5  4.0 - 10.5 K/uL   RBC 5.78 (*) 3.87 - 5.11 MIL/uL   Hemoglobin 16.1 (*) 12.0 - 15.0 g/dL   HCT 40.9 (*) 81.1 - 91.4 %   MCV 83.4  78.0 - 100.0 fL   MCH 27.9  26.0 - 34.0 pg   MCHC 33.4  30.0 - 36.0 g/dL   RDW 08.6 (*) 57.8 - 46.9 %   Platelets 125 (*) 150 - 400 K/uL  BASIC METABOLIC PANEL     Status: Abnormal   Collection Time    09/09/13  5:30 AM      Result Value Range   Sodium 131 (*) 135 - 145 mEq/L   Potassium 4.9  3.5 - 5.1 mEq/L   Chloride 90 (*) 96 - 112 mEq/L   CO2 27  19 - 32 mEq/L   Glucose, Bld 194 (*) 70 - 99 mg/dL   BUN 41 (*) 6 - 23 mg/dL   Creatinine, Ser 6.29 (*) 0.50 - 1.10 mg/dL   Calcium 9.2  8.4 - 52.8 mg/dL   GFR calc non Af Amer 9 (*) >90 mL/min   GFR calc Af Amer 10 (*) >90 mL/min   Comment: (NOTE)     The eGFR has been calculated using the CKD EPI equation.     This calculation has not been validated in all clinical situations.     eGFR's persistently <90 mL/min signify possible Chronic Kidney     Disease.  GLUCOSE, CAPILLARY     Status: Abnormal   Collection Time    09/09/13 11:31 AM      Result Value Range   Glucose-Capillary 184 (*) 70 - 99 mg/dL  HEPARIN LEVEL (UNFRACTIONATED)     Status: Abnormal   Collection Time    09/09/13   2:19 PM      Result Value Range   Heparin Unfractionated 0.27 (*) 0.30 - 0.70 IU/mL   Comment:            IF HEPARIN RESULTS ARE BELOW     EXPECTED VALUES, AND PATIENT     DOSAGE HAS BEEN CONFIRMED,     SUGGEST FOLLOW UP TESTING     OF ANTITHROMBIN III LEVELS.  TROPONIN I     Status: None   Collection Time    09/09/13  2:20 PM      Result Value Range   Troponin I <0.30  <0.30 ng/mL   Comment:            Due to the release kinetics of cTnI,     a negative result within the first hours     of the onset of symptoms does not rule out     myocardial infarction with certainty.     If myocardial infarction is still suspected,     repeat the test at appropriate intervals.  GLUCOSE, CAPILLARY     Status: Abnormal   Collection Time    09/09/13  4:58 PM      Result Value Range   Glucose-Capillary 171 (*) 70 - 99 mg/dL  GLUCOSE, CAPILLARY     Status: Abnormal   Collection Time    09/09/13  9:30 PM      Result Value Range   Glucose-Capillary 131 (*) 70 - 99 mg/dL   Comment 1 Documented in Chart     Comment 2 Notify RN    HEPARIN LEVEL (UNFRACTIONATED)     Status: None   Collection Time    09/09/13 11:30 PM      Result Value Range   Heparin Unfractionated 0.44  0.30 - 0.70 IU/mL   Comment:            IF HEPARIN RESULTS ARE BELOW     EXPECTED VALUES, AND PATIENT  DOSAGE HAS BEEN CONFIRMED,     SUGGEST FOLLOW UP TESTING     OF ANTITHROMBIN III LEVELS.  HEPARIN LEVEL (UNFRACTIONATED)     Status: None   Collection Time    09/10/13  4:50 AM      Result Value Range   Heparin Unfractionated 0.42  0.30 - 0.70 IU/mL   Comment:            IF HEPARIN RESULTS ARE BELOW     EXPECTED VALUES, AND PATIENT     DOSAGE HAS BEEN CONFIRMED,     SUGGEST FOLLOW UP TESTING     OF ANTITHROMBIN III LEVELS.  CBC     Status: Abnormal   Collection Time    09/10/13  4:50 AM      Result Value Range   WBC 6.8  4.0 - 10.5 K/uL   RBC 5.88 (*) 3.87 - 5.11 MIL/uL   Hemoglobin 16.2 (*) 12.0 - 15.0 g/dL    HCT 40.9 (*) 81.1 - 46.0 %   MCV 82.5  78.0 - 100.0 fL   MCH 27.6  26.0 - 34.0 pg   MCHC 33.4  30.0 - 36.0 g/dL   RDW 91.4 (*) 78.2 - 95.6 %   Platelets 123 (*) 150 - 400 K/uL  PROTIME-INR     Status: Abnormal   Collection Time    09/10/13  4:50 AM      Result Value Range   Prothrombin Time 17.0 (*) 11.6 - 15.2 seconds   INR 1.42  0.00 - 1.49  RENAL FUNCTION PANEL     Status: Abnormal   Collection Time    09/10/13  4:50 AM      Result Value Range   Sodium 128 (*) 135 - 145 mEq/L   Potassium 5.3 (*) 3.5 - 5.1 mEq/L   Chloride 87 (*) 96 - 112 mEq/L   CO2 25  19 - 32 mEq/L   Glucose, Bld 142 (*) 70 - 99 mg/dL   BUN 47 (*) 6 - 23 mg/dL   Creatinine, Ser 2.13 (*) 0.50 - 1.10 mg/dL   Calcium 9.0  8.4 - 08.6 mg/dL   Phosphorus 6.3 (*) 2.3 - 4.6 mg/dL   Albumin 2.0 (*) 3.5 - 5.2 g/dL   GFR calc non Af Amer 7 (*) >90 mL/min   GFR calc Af Amer 8 (*) >90 mL/min   Comment: (NOTE)     The eGFR has been calculated using the CKD EPI equation.     This calculation has not been validated in all clinical situations.     eGFR's persistently <90 mL/min signify possible Chronic Kidney     Disease.  GLUCOSE, CAPILLARY     Status: Abnormal   Collection Time    09/10/13  7:53 AM      Result Value Range   Glucose-Capillary 148 (*) 70 - 99 mg/dL  GLUCOSE, CAPILLARY     Status: Abnormal   Collection Time    09/10/13 12:29 PM      Result Value Range   Glucose-Capillary 150 (*) 70 - 99 mg/dL    ROS: see hpi for pos.  Physical Exam: Filed Vitals:   09/10/13 1224  BP: 112/58  Pulse: 112  Temp: 98.1 F (36.7 C)  Resp: 19     General: alert bf . Nad, chronically ill HEENT: [place] mmm Eyes: eomi Neck: Supple. Sl jvd Heart: Tachy irreg irreg/ 1 /6 sem lsb. No rub Lungs: faint bibasilar rales Abdomen: bs + , soft, nontender  Extremities: R BKA dressing not removed/ L AKA 1 + edema on right  Skin: R BKA wound dressing in place/ no overt rash Neuro:  OX3, moves all  extrm Dialysis Access:   Pos bruit L U A  AVF  Dialysis Orders: Center: [facility]   on MWF . EDW  85.5 kg  HD Bath  2.0 k / 2.25 ca  Time 4hrs Heparin  2500. Access l U A AVF BFR 400  DFR 800    Hectorol 2 mcg IV/HD Epogen 0   Units IV/HD  Venofer  0   Op Labs= Last hgb 14.4 op 08/29/13 and tfs 34% 08/22/13 lasrt pth 145 08/22/13  Assessment/Plan 1. New Atrial fib with RVR= Cardiology managing- on Iv Cardizem and Heparin currently with noted plans per Cardiology Cardioversion TEE  For tomorrow / noted 2d echo with ef 30-35%  2. ESRD -  HD today MWF ( [facility]) keep  normal schedule in hosp.    5.3 K 3. Hypertension/volume  -  Bed wt in hosp ??69.7kg and last op wt = 85.6  On 09/07/13  edw 85.5 kg/ cxr some pul. Vascular congestion attempt 2.5 to 3.5 l uf with sbp keep at 100/ on Midodrine 10mg  pre hd only/ noted probably not able to tolerated po cardizem sec to low bp on hd / card looking into Amiodarone use if needed 4. Anemia  -  No ESA with hgb >12/ no iron 5. Metabolic bone disease -  Hold Hectorol with mild Hypercalcemia corrected to / Renvela binder with meals 6. Nutrition - renal carb mod / renal vit  Lenny Pastel, PA-C [facility] Beeper [phone] 09/10/2013, 2:57 PM   I have seen and examined patient, discussed with PA and agree with assessment and plan as outlined above. Vinson Moselle MD pager [phone]    cell [phone] 09/10/2013, 6:16 PM

## 2013-09-10 NOTE — Progress Notes (Signed)
ANTICOAGULATION CONSULT NOTE - Follow Up Consult  Pharmacy Consult for Heparin  Indication: atrial fibrillation  Allergies  Allergen Reactions  . Neurontin [Gabapentin] Anaphylaxis  . Ezetimibe-Simvastatin Other (See Comments)    REACTION: face edema; itching  . Klonopin [Clonazepam] Other (See Comments)    hallucination  . Sertraline Hcl Other (See Comments)    hallucination    Patient Measurements: Height: 5\' 4"  (162.6 cm) Weight: 153 lb 10.6 oz (69.7 kg) IBW/kg (Calculated) : 54.7  Vital Signs: Temp: 98.1 F (36.7 C) (12/29 1224) Temp src: Oral (12/29 1224) BP: 112/58 mmHg (12/29 1224) Pulse Rate: 112 (12/29 1224)  Labs:  Recent Labs  09/08/13 1546 09/08/13 2231 09/09/13 0020 09/09/13 0235  09/09/13 0530 09/09/13 1419 09/09/13 1420 09/09/13 2330 09/10/13 0450  HGB 16.0* 15.5*  --   --   --  16.1*  --   --   --  16.2*  HCT 48.7* 46.0  --   --   --  48.2*  --   --   --  48.5*  PLT 127* 122*  --   --   --  125*  --   --   --  123*  APTT  --   --  61*  --   --   --   --   --   --   --   LABPROT  --   --  16.2*  --   --   --   --   --   --  17.0*  INR  --   --  1.33  --   --   --   --   --   --  1.42  HEPARINUNFRC  --   --   --   --   < > 0.16* 0.27*  --  0.44 0.42  CREATININE 4.50*  --   --   --   --  4.90*  --   --   --  5.79*  TROPONINI <0.30 <0.30  --  <0.30  --   --   --  <0.30  --   --   < > = values in this interval not displayed.  Estimated Creatinine Clearance: 10.1 ml/min (by C-G formula based on Cr of 5.79).   Medications:  Heparin 1450 units/hr  Assessment: 57 y/o F on heparin/coumadin for afib. Heparin is at goal (HL= 0.42) on 1450 units/hr and INR= 1.42 on day 2 coumadin (baseline INR= 1.33). Patient noted for TEE guided cardioversion today (if no HD needed).  Goal of Therapy:  Heparin level 0.3-0.7 units/ml Monitor platelets by anticoagulation protocol: Yes   Plan:  -Continue heparin at 1450 units/hr -Daily CBC/HL -Continue coumadin 5mg   po today -Will order coumadin book and video  Harland German, Pharm D 09/10/2013 1:11 PM

## 2013-09-10 NOTE — Progress Notes (Signed)
Dr Lowell Guitar paged no return call

## 2013-09-10 NOTE — Progress Notes (Signed)
[facility] Progress Note  TEAM 1 - Stepdown/ICU TEAM   [name] WUJ:[id number] DOB: Jun 11, 1956 DOA: 09/08/2013 PCP: Trevor Iha, MD  Admit HPI / Brief Narrative: 57 y.o. female with multiple medical problems including ESRD on HD who presented to the ED with complaints of right sided chest pain of acute onset. She reported having sharp pain in the right chest under her right breast area radiating into her back. She denied feeling any palpitations, but she did feel SOB. She was brought to the ED and was found to have new onset Atrial Fibrillation with RVR.   HPI/Subjective: She complains of significant back pain exacerbated by laying in bed.  She denies current chest pain shortness of breath fevers chills nausea or vomiting.    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 rate 19, height 5\' 4"  (1.626 m), weight 69.7 kg (153 lb 10.6 oz), SpO2 94.00%.  Intake/Output Summary (Last 24 hours) at 09/10/13 1303 Last data filed at 09/10/13 1200  Gross per 24 hour  Intake 990.27 ml  Output      0 ml  Net 990.27 ml   Exam: General: No acute respiratory distress at rest  Lungs: distant bs th/o all fields - mild bibasilar crackles - no wheeze  Cardiovascular: irreg irreg w/o appreciable M - no rub  Abdomen: Obese, nontender, nondistended, soft, bowel sounds positive, no rebound, no ascites, no appreciable mass Extremities: R BKA and L AKA - dressing on R LE wound clean and dry   Data Reviewed: Basic Metabolic Panel:  Recent Labs Lab 09/08/13 1546 09/09/13 0530 09/10/13 0450  NA 131* 131* 128*  K 4.5 4.9 5.3*  CL 88* 90* 87*  CO2 32 27 25  GLUCOSE 185* 194* 142*  BUN 35* 41* 47*  CREATININE 4.50* 4.90* 5.79*  CALCIUM 9.5 9.2 9.0  PHOS  --   --  6.3*   Liver Function Tests:  Recent Labs Lab 09/10/13 0450  ALBUMIN 2.0*    CBC:  Recent Labs Lab 09/08/13 1546 09/08/13 2231 09/09/13 0530 09/10/13 0450  WBC 9.6 9.5 7.5 6.8  HGB 16.0* 15.5* 16.1* 16.2*  HCT 48.7* 46.0 48.2* 48.5*  MCV 84.1 84.9 83.4 82.5  PLT 127* 122* 125* 123*   Cardiac Enzymes:  Recent Labs Lab 09/08/13 1546 09/08/13 2231 09/09/13 0235 09/09/13 1420  TROPONINI <0.30 <0.30 <0.30 <0.30  BNP (last 3 results)  Recent Labs  09/08/13 1546  PROBNP 32871.0*   CBG:  Recent Labs Lab 09/09/13 1131 09/09/13 1658 09/09/13 2130 09/10/13 0753 09/10/13 1229  GLUCAP 184* 171* 131* 148* 150*    Recent Results (from the past 240 hour(s))  MRSA PCR SCREENING     Status: None   Collection Time    09/08/13  9:30 PM      Result Value Range Status   MRSA by PCR NEGATIVE  NEGATIVE Final   Comment:            The GeneXpert MRSA Assay (FDA     approved for NASAL specimens     only), is one component of a     comprehensive MRSA colonization      surveillance program. It is not     intended to diagnose MRSA     infection nor to guide or     monitor treatment for     MRSA infections.  CULTURE, BLOOD (ROUTINE X 2)     Status: None   Collection Time    09/08/13 11:53 PM      Result Value Range Status   Specimen Description BLOOD LEFT ARM   Final   Special Requests BOTTLES DRAWN AEROBIC ONLY 10CC   Final   Culture  Setup Time     Final   Value: 09/09/2013 14:18     Performed at [facility]   Culture     Final   Value:        BLOOD CULTURE RECEIVED NO GROWTH TO DATE CULTURE WILL BE HELD FOR 5 DAYS BEFORE ISSUING A FINAL NEGATIVE REPORT     Performed at [facility]   Report Status PENDING   Incomplete  CULTURE, BLOOD (ROUTINE X 2)     Status: None   Collection Time    09/08/13 11:59 PM      Result Value Range Status   Specimen Description BLOOD LEFT HAND   Final   Special Requests BOTTLES DRAWN AEROBIC ONLY 8CC   Final   Culture  Setup Time     Final   Value: 09/09/2013 14:18     Performed at [facility]   Culture     Final   Value:        BLOOD CULTURE RECEIVED NO GROWTH TO DATE CULTURE WILL BE HELD FOR 5 DAYS BEFORE ISSUING A FINAL NEGATIVE REPORT     Performed at [facility]   Report Status PENDING   Incomplete     Studies:  Recent x-ray studies have been reviewed in detail by the Attending Physician  Scheduled Meds:  Scheduled Meds: . aspirin EC  81 mg Oral Daily  . atorvastatin  10 mg Oral QAC breakfast  . B-complex with vitamin C  1 tablet Oral Daily  . cinacalcet  60 mg Oral Q breakfast  . insulin aspart  0-15 Units Subcutaneous TID WC  . insulin aspart  3 Units Subcutaneous TID WC  . midodrine  10 mg Oral Q M,W,F  . multivitamin  1 tablet Oral QHS  . pregabalin  25 mg Oral BID  . sevelamer carbonate  1,600 mg Oral TID WC  . silver sulfADIAZINE   Topical BID  . Warfarin - Pharmacist Dosing Inpatient   Does not apply q1800    Time spent on care of this patient: 35  mins   [facility] T  [facility] Office  [phone] Pager - Text Page  per Amion as per below:  On-Call/Text Page:      [email or web address]      password [email or web address]  If 7PM-7AM, please contact night-coverage [email or web address] Password [email or web address] 09/10/2013, 1:03 PM   LOS: 2 days

## 2013-09-10 NOTE — Progress Notes (Signed)
Pt. Refused cpap for tonight. Pt. States she would rather wear her own cpap from home. RT informed pt. To notify if she changes her mind.

## 2013-09-11 ENCOUNTER — Inpatient Hospital Stay (HOSPITAL_COMMUNITY): Payer: Medicare Other | Admitting: Certified Registered Nurse Anesthetist

## 2013-09-11 ENCOUNTER — Encounter (HOSPITAL_COMMUNITY): Payer: Self-pay | Admitting: Gastroenterology

## 2013-09-11 ENCOUNTER — Encounter (HOSPITAL_COMMUNITY): Payer: Medicare Other | Admitting: Certified Registered Nurse Anesthetist

## 2013-09-11 ENCOUNTER — Encounter (HOSPITAL_COMMUNITY)
Admission: EM | Disposition: A | Discharge status: Home / Self Care | Payer: Self-pay | Source: Home / Self Care | Attending: Pulmonary Disease

## 2013-09-11 DIAGNOSIS — N189 Chronic kidney disease, unspecified: Secondary | ICD-10-CM

## 2013-09-11 DIAGNOSIS — I739 Peripheral vascular disease, unspecified: Secondary | ICD-10-CM

## 2013-09-11 DIAGNOSIS — L98499 Non-pressure chronic ulcer of skin of other sites with unspecified severity: Secondary | ICD-10-CM

## 2013-09-11 DIAGNOSIS — D631 Anemia in chronic kidney disease: Secondary | ICD-10-CM

## 2013-09-11 HISTORY — PX: CARDIOVERSION: SHX1299

## 2013-09-11 HISTORY — PX: TEE WITHOUT CARDIOVERSION: SHX5443

## 2013-09-11 LAB — HEPARIN LEVEL (UNFRACTIONATED): Heparin Unfractionated: 0.47 IU/mL (ref 0.30–0.70)

## 2013-09-11 LAB — GLUCOSE, CAPILLARY: Glucose-Capillary: 144 mg/dL — ABNORMAL HIGH (ref 70–99)

## 2013-09-11 LAB — CBC
MCH: 27.7 pg (ref 26.0–34.0)
RBC: 5.52 MIL/uL — ABNORMAL HIGH (ref 3.87–5.11)

## 2013-09-11 SURGERY — ECHOCARDIOGRAM, TRANSESOPHAGEAL
Anesthesia: General

## 2013-09-11 MED ORDER — SODIUM CHLORIDE 0.9 % IV SOLN: 250.0000 mL | INTRAVENOUS | Status: DC | PRN

## 2013-09-11 MED ORDER — SODIUM CHLORIDE 0.9 % IV SOLN
INTRAVENOUS | Status: DC | PRN
  Administered 2013-09-11: 10:00:00 via INTRAVENOUS

## 2013-09-11 MED ORDER — FENTANYL CITRATE 0.05 MG/ML IJ SOLN
INTRAMUSCULAR | Status: DC | PRN
  Administered 2013-09-11: 25 ug via INTRAVENOUS

## 2013-09-11 MED ORDER — MIDAZOLAM HCL 5 MG/ML IJ SOLN
INTRAMUSCULAR | Status: AC
  Filled 2013-09-11: qty 2

## 2013-09-11 MED ORDER — LIDOCAINE VISCOUS 2 % MT SOLN
OROMUCOSAL | Status: DC | PRN
  Administered 2013-09-11: 10 mL via OROMUCOSAL

## 2013-09-11 MED ORDER — DIPHENHYDRAMINE HCL 50 MG/ML IJ SOLN
INTRAMUSCULAR | Status: AC
  Filled 2013-09-11: qty 1

## 2013-09-11 MED ORDER — WARFARIN SODIUM 2 MG PO TABS
2.0000 mg | ORAL_TABLET | Freq: Once | ORAL | Status: AC
  Filled 2013-09-11: qty 1

## 2013-09-11 MED ORDER — MIDAZOLAM HCL 10 MG/2ML IJ SOLN
INTRAMUSCULAR | Status: DC | PRN
  Administered 2013-09-11: 2 mg via INTRAVENOUS

## 2013-09-11 MED ORDER — SODIUM CHLORIDE 0.9 % IJ SOLN
3.0000 mL | Freq: Two times a day (BID) | INTRAMUSCULAR | Status: DC
  Administered 2013-09-11 – 2013-09-12 (×2): 3 mL via INTRAVENOUS

## 2013-09-11 MED ORDER — LIDOCAINE VISCOUS 2 % MT SOLN
OROMUCOSAL | Status: AC
  Filled 2013-09-11: qty 15

## 2013-09-11 MED ORDER — SODIUM CHLORIDE 0.9 % IJ SOLN: 3.0000 mL | INTRAMUSCULAR | Status: DC | PRN

## 2013-09-11 MED ORDER — FENTANYL CITRATE 0.05 MG/ML IJ SOLN
INTRAMUSCULAR | Status: AC
  Filled 2013-09-11: qty 2

## 2013-09-11 MED ORDER — PROPOFOL 10 MG/ML IV BOLUS
INTRAVENOUS | Status: DC | PRN
  Administered 2013-09-11: 50 mg via INTRAVENOUS

## 2013-09-11 NOTE — Progress Notes (Signed)
Dr. Butler Denmark ordered an in&out to get a urine sample. Performed in&out per policy and unable to get a specimen. Dr. Butler Denmark notified.

## 2013-09-11 NOTE — Transfer of Care (Signed)
Immediate Anesthesia Transfer of Care Note  Patient: [name]  Procedure(s) Performed: Procedure(s): TRANSESOPHAGEAL ECHOCARDIOGRAM (TEE) (N/A) CARDIOVERSION (N/A)  Patient Location: Endoscopy Unit  Anesthesia Type:General  Level of Consciousness: sedated  Airway & Oxygen Therapy: Patient Spontanous Breathing and Patient connected to nasal cannula oxygen  Post-op Assessment: Report given to PACU RN, Post -op Vital signs reviewed and stable and Patient moving all extremities  Post vital signs: Reviewed and stable  Complications: No apparent anesthesia complications

## 2013-09-11 NOTE — Progress Notes (Signed)
  Echocardiogram Echocardiogram Transesophageal has been performed.  [name] 09/11/2013, 9:26 AM

## 2013-09-11 NOTE — Progress Notes (Signed)
Subjective:  Feels better sp cardioversion this am/ some Right sided chest discomfort  But  Improved/ tolerated hd yesterday  Objective Vital signs in last 24 hours: Filed Vitals:   09/11/13 1015 09/11/13 1100 09/11/13 1200 09/11/13 1400  BP: 92/64 162/91 165/103 111/59  Pulse: 103 90 85   Temp:   100.5 F (38.1 C)   TempSrc:   Oral   Resp: 18 16  23   Height:      Weight:      SpO2: 82% 100% 96% 93%   Weight change:   Intake/Output Summary (Last 24 hours) at 09/11/13 1445 Last data filed at 09/11/13 1200  Gross per 24 hour  Intake    602 ml  Output   1566 ml  Net   -964 ml   Labs: Basic Metabolic Panel:  Recent Labs Lab 09/08/13 1546 09/09/13 0530 09/10/13 0450  NA 131* 131* 128*  K 4.5 4.9 5.3*  CL 88* 90* 87*  CO2 32 27 25  GLUCOSE 185* 194* 142*  BUN 35* 41* 47*  CREATININE 4.50* 4.90* 5.79*  CALCIUM 9.5 9.2 9.0  PHOS  --   --  6.3*   Liver Function Tests:  Recent Labs Lab 09/10/13 0450  ALBUMIN 2.0*   No results found for this basename: LIPASE, AMYLASE,  in the last 168 hours No results found for this basename: AMMONIA,  in the last 168 hours CBC:  Recent Labs Lab 09/08/13 1546 09/08/13 2231 09/09/13 0530 09/10/13 0450 09/11/13 0500  WBC 9.6 9.5 7.5 6.8 6.9  HGB 16.0* 15.5* 16.1* 16.2* 15.3*  HCT 48.7* 46.0 48.2* 48.5* 46.5*  MCV 84.1 84.9 83.4 82.5 84.2  PLT 127* 122* 125* 123* 105*   Cardiac Enzymes:  Recent Labs Lab 09/08/13 1546 09/08/13 2231 09/09/13 0235 09/09/13 1420  TROPONINI <0.30 <0.30 <0.30 <0.30   CBG:  Recent Labs Lab 09/10/13 1229 09/10/13 2138 09/11/13 0957 09/11/13 1040 09/11/13 1219  GLUCAP 150* 101* 107* 103* 114*   Medications: . sodium chloride 10 mL/hr (09/11/13 0502)  . diltiazem (CARDIZEM) infusion 10 mg/hr (09/11/13 0800)  . heparin 1,450 Units/hr (09/11/13 0502)   . aspirin EC  81 mg Oral Daily  . atorvastatin  10 mg Oral QAC breakfast  . B-complex with vitamin C  1 tablet Oral Daily  .  cinacalcet  60 mg Oral Q breakfast  . feeding supplement (RESOURCE BREEZE)  1 Container Oral TID BM  . insulin aspart  0-15 Units Subcutaneous TID WC  . insulin aspart  3 Units Subcutaneous TID WC  . midodrine  10 mg Oral Q M,W,F  . multivitamin  1 tablet Oral QHS  . pregabalin  25 mg Oral BID  . sevelamer carbonate  1,600 mg Oral TID WC  . silver sulfADIAZINE   Topical BID  . sodium chloride  3 mL Intravenous Q12H  . warfarin  2 mg Oral ONCE-1800  . warfarin  5 mg Oral ONCE-1800  . warfarin   Does not apply Once  . Warfarin - Pharmacist Dosing Inpatient   Does not apply q1800    General:  Awoken from sleep/ alert /NAD Heart: Tachy Regular Lungs: CTA bilat Abdomen: bs + , soft, nontender  Extremities: R BKA dressing not removed/ L AKA/ no edema   Dialysis Access: Pos bruit L U A AVF   Dialysis Orders: Center: [facility] on MWF .  EDW 85.5 kg HD Bath 2.0 k / 2.25 ca Time 4hrs Heparin 2500. Access l U A AVF BFR  400 DFR 800  Hectorol 2 mcg IV/HD Epogen 0 Units IV/HD Venofer 0 Op Labs= Last hgb 14.4 op 08/29/13 and tfs 34% 08/22/13 lasrt pth 145 08/22/13   Assessment/Plan  1. New Atrial fib with RVR= sp Successful TEE Cardioversion  This am/Cardiology managing- on Iv Cardizem and Heparin  And coumadin started / noted 2d echo with ef 30-35%  2. ESRD - HD today MWF ( [facility]) keep normal schedule in hosp. - next hd in am/ [facility] renal panel labs 3. Hypertension/volume - Bed wt in hosp  This am= 96.7 kg ??? Accuracy/ With  1566 uf yesterday on hd/ bp 100/56 now still requriring Iv Cardizem  with low bp and  and  edw 85.5 kg/ cxr  On admit "some pul. Vascular congestion"/ with wed hd again attempt 2.5 to 3.5 l uf with sbp keep at 100/ on Midodrine 10mg  pre hd only/ noted probably not able to tolerate po cardizem sec to low bp on hd / card looking into Amiodarone use  4. Low grade fever- admit team  Wu/ AVF stable/ ? Amp site 5. Anemia - No ESA with hgb >12/ no iron 6. Metabolic bone disease - Hold Hectorol  with mild Hypercalcemia corrected to 11.0 phos 6.3/ Renvela binder with meals 7. Nutrition - renal carb mod / renal vit 8. PVD with R BKA wound dressing changes daily / silvadene   Lenny Pastel, PA-C [facility] Beeper [phone] 09/11/2013,2:45 PM  LOS: 3 days   I have seen and examined patient, discussed with PA and agree with assessment and plan as outlined above. Vinson Moselle MD pager [phone]    cell [phone] 09/11/2013, 4:20 PM

## 2013-09-11 NOTE — H&P (View-Only) (Signed)
    Subjective:  Denies dyspnea; chest pain improved.  Feels palpitations   Objective:  Filed Vitals:   09/10/13 2346 09/11/13 0345 09/11/13 0400 09/11/13 0800  BP:   99/63   Pulse:   103 93  Temp: 99 F (37.2 C) 100.6 F (38.1 C)  97.8 F (36.6 C)  TempSrc: Oral Oral  Oral  Resp:   25 17  Height:      Weight:   213 lb 3 oz (96.7 kg)   SpO2:   95%     Intake/Output from previous day:  Intake/Output Summary (Last 24 hours) at 09/11/13 0853 Last data filed at 09/11/13 0700  Gross per 24 hour  Intake  526.5 ml  Output   1566 ml  Net -1039.5 ml    Physical Exam: Physical exam: Well-developed well-nourished in no acute distress.  Skin is warm and dry.  HEENT is normal.  Neck is supple.  Chest with mild basilar crackles. Cardiovascular exam is irregular and tachycardic Abdominal exam nontender or distended. No masses palpated. 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; 1+ edema on right; dressing in place neuro grossly intact    Lab Results: Basic Metabolic Panel:  Recent Labs  16/10/96 0530 09/10/13 0450  NA 131* 128*  K 4.9 5.3*  CL 90* 87*  CO2 27 25  GLUCOSE 194* 142*  BUN 41* 47*  CREATININE 4.90* 5.79*  CALCIUM 9.2 9.0  PHOS  --  6.3*   CBC:  Recent Labs  09/10/13 0450 09/11/13 0500  WBC 6.8 6.9  HGB 16.2* 15.3*  HCT 48.5* 46.5*  MCV 82.5 84.2  PLT 123* 105*   Cardiac Enzymes:  Recent Labs  09/08/13 2231 09/09/13 0235 09/09/13 1420  TROPONINI <0.30 <0.30 <0.30     Assessment/Plan:  1 atrial fibrillation-patient remains in atrial fibrillation with mildly elevated rate. Check TSH.Will continue Cardizem for rate control. Continue heparin and begin Coumadin. Plan to proceed with TEE guided cardioversion today. If she did not hold sinus rhythm she will most likely need amiodarone as her blood pressure is borderline and we have little room to increase AV nodal blocking agents. 2 chest pain-symptoms extremely atypical. Enzymes negative. Would not  pursue further ischemia evaluation. There is increased with certain movements. Question musculoskeletal. 3 chronic end-stage renal disease-management per nephrology. 4 peripheral vascular disease status post right BKA-wound care should follow. 5 ischemic cardiomyopathy-continue statin. Add low-dose beta blocker later when she is in sinus rhythm and Cardizem can be discontinued. Medications will be limited by blood pressure. 6 hyperlipidemia-continue statin.  Tinsley Lomas S. 09/11/2013, 8:53 AM

## 2013-09-11 NOTE — Progress Notes (Signed)
    Subjective:  Denies dyspnea; chest pain improved.  Feels palpitations   Objective:  Filed Vitals:   09/10/13 2346 09/11/13 0345 09/11/13 0400 09/11/13 0800  BP:   99/63   Pulse:   103 93  Temp: 99 F (37.2 C) 100.6 F (38.1 C)  97.8 F (36.6 C)  TempSrc: Oral Oral  Oral  Resp:   25 17  Height:      Weight:   213 lb 3 oz (96.7 kg)   SpO2:   95%     Intake/Output from previous day:  Intake/Output Summary (Last 24 hours) at 09/11/13 0853 Last data filed at 09/11/13 0700  Gross per 24 hour  Intake  526.5 ml  Output   1566 ml  Net -1039.5 ml    Physical Exam: Physical exam: Well-developed well-nourished in no acute distress.  Skin is warm and dry.  HEENT is normal.  Neck is supple.  Chest with mild basilar crackles. Cardiovascular exam is irregular and tachycardic Abdominal exam nontender or distended. No masses palpated. 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; 1+ edema on right; dressing in place neuro grossly intact    Lab Results: Basic Metabolic Panel:  Recent Labs  09/09/13 0530 09/10/13 0450  NA 131* 128*  K 4.9 5.3*  CL 90* 87*  CO2 27 25  GLUCOSE 194* 142*  BUN 41* 47*  CREATININE 4.90* 5.79*  CALCIUM 9.2 9.0  PHOS  --  6.3*   CBC:  Recent Labs  09/10/13 0450 09/11/13 0500  WBC 6.8 6.9  HGB 16.2* 15.3*  HCT 48.5* 46.5*  MCV 82.5 84.2  PLT 123* 105*   Cardiac Enzymes:  Recent Labs  09/08/13 2231 09/09/13 0235 09/09/13 1420  TROPONINI <0.30 <0.30 <0.30     Assessment/Plan:  1 atrial fibrillation-patient remains in atrial fibrillation with mildly elevated rate. Check TSH.Will continue Cardizem for rate control. Continue heparin and begin Coumadin. Plan to proceed with TEE guided cardioversion today. If she did not hold sinus rhythm she will most likely need amiodarone as her blood pressure is borderline and we have little room to increase AV nodal blocking agents. 2 chest pain-symptoms extremely atypical. Enzymes negative. Would not  pursue further ischemia evaluation. There is increased with certain movements. Question musculoskeletal. 3 chronic end-stage renal disease-management per nephrology. 4 peripheral vascular disease status post right BKA-wound care should follow. 5 ischemic cardiomyopathy-continue statin. Add low-dose beta blocker later when she is in sinus rhythm and Cardizem can be discontinued. Medications will be limited by blood pressure. 6 hyperlipidemia-continue statin.  Nikodem Leadbetter S. 09/11/2013, 8:53 AM    

## 2013-09-11 NOTE — Anesthesia Preprocedure Evaluation (Addendum)
Anesthesia Evaluation  Patient identified by MRN, date of birth, ID band Patient awake    Reviewed: Allergy & Precautions, H&P , NPO status , Patient's Chart, lab work & pertinent test results  Airway Mallampati: II  Neck ROM: full    Dental  (+) Edentulous Upper, Partial Lower, Poor Dentition and Dental Advisory Given   Pulmonary shortness of breath, sleep apnea ,  Pulmonary HTN         Cardiovascular hypertension, + CAD, + Past MI, + CABG and + Peripheral Vascular Disease + dysrhythmias Atrial Fibrillation  Echo 09/09/13 EF 30-35%   Neuro/Psych TIA   GI/Hepatic   Endo/Other  diabetes, Type 2  Renal/GU ESRF and DialysisRenal disease     Musculoskeletal   Abdominal   Peds  Hematology   Anesthesia Other Findings   Reproductive/Obstetrics                          Anesthesia Physical Anesthesia Plan  ASA: III  Anesthesia Plan: General   Post-op Pain Management:    Induction: Intravenous  Airway Management Planned: Mask  Additional Equipment:   Intra-op Plan:   Post-operative Plan:   Informed Consent: I have reviewed the patients History and Physical, chart, labs and discussed the procedure including the risks, benefits and alternatives for the proposed anesthesia with the patient or authorized representative who has indicated his/her understanding and acceptance.   Dental advisory given  Plan Discussed with: CRNA and Anesthesiologist  Anesthesia Plan Comments:         Anesthesia Quick Evaluation

## 2013-09-11 NOTE — CV Procedure (Signed)
Electrical Cardioversion Procedure Note [name] [id number] 10-Aug-1956  Procedure: Electrical Cardioversion Indications:  Atrial Fibrillation  Time Out: Verified patient identification, verified procedure,medications/allergies/relevent history reviewed, required imaging and test results available.  Performed  Procedure Details  TEE showed no LA/LAA thrombus.  The patient was NPO after midnight. Anesthesia was administered at the beside  by Dr.Hodierne with 50mg  of propofol.  Cardioversion was done with synchronized biphasic defibrillation with AP pads with 120 J.  The patient converted to normal sinus rhythm. The patient tolerated the procedure well   IMPRESSION:  Successful cardioversion of atrial fibrillation to NSR  Continue coumadin indefinitely.    [name] 09/11/2013, 9:33 AM

## 2013-09-11 NOTE — Progress Notes (Signed)
Pt states she will get home CPAP unit brought in.  Pt says she doesn't want a hospital CPAP machine.  Pt currently comfortable on 4LNC.

## 2013-09-11 NOTE — Progress Notes (Signed)
ANTICOAGULATION CONSULT NOTE - Follow Up Consult  Pharmacy Consult for Heparin  Indication: atrial fibrillation  Allergies  Allergen Reactions  . Neurontin [Gabapentin] Anaphylaxis  . Ezetimibe-Simvastatin Other (See Comments)    REACTION: face edema; itching  . Klonopin [Clonazepam] Other (See Comments)    hallucination  . Sertraline Hcl Other (See Comments)    hallucination    Patient Measurements: Height: 5\' 4"  (162.6 cm) Weight: 213 lb 3 oz (96.7 kg) IBW/kg (Calculated) : 54.7  Vital Signs: Temp: 100.5 F (38.1 C) (12/30 1200) Temp src: Oral (12/30 1200) BP: 165/103 mmHg (12/30 1200) Pulse Rate: 85 (12/30 1200)  Labs:  Recent Labs  09/08/13 1546 09/08/13 2231 09/09/13 0020 09/09/13 0235 09/09/13 0530  09/09/13 1420 09/09/13 2330 09/10/13 0450 09/11/13 0500  HGB 16.0* 15.5*  --   --  16.1*  --   --   --  16.2* 15.3*  HCT 48.7* 46.0  --   --  48.2*  --   --   --  48.5* 46.5*  PLT 127* 122*  --   --  125*  --   --   --  123* 105*  APTT  --   --  61*  --   --   --   --   --   --   --   LABPROT  --   --  16.2*  --   --   --   --   --  17.0* 22.5*  INR  --   --  1.33  --   --   --   --   --  1.42 2.05*  HEPARINUNFRC  --   --   --   --  0.16*  < >  --  0.44 0.42 0.47  CREATININE 4.50*  --   --   --  4.90*  --   --   --  5.79*  --   TROPONINI <0.30 <0.30  --  <0.30  --   --  <0.30  --   --   --   < > = values in this interval not displayed.  Estimated Creatinine Clearance: 12 ml/min (by C-G formula based on Cr of 5.79).   Medications:  Heparin 1450 units/hr  Assessment: 57 y/o F on heparin/coumadin for afib. Heparin is at goal (HL= 0.47) on 1450 units/hr and INR= 2.05 on day 3 coumadin (baseline INR= 1.33). Patient noted s/p TEE guided cardioversion to NSR on 09/11/13.  Goal of Therapy:  Heparin level 0.3-0.7 units/ml Monitor platelets by anticoagulation protocol: Yes   Plan:  -Continue heparin at 1450 units/hr -Daily CBC/HL -Decrease coumadin to 2mg  po  today -Daily PT/INR  Harland German, Pharm D 09/11/2013 1:19 PM

## 2013-09-11 NOTE — Progress Notes (Signed)
[facility] Progress Note [place] TEAM 1 - Stepdown/ICU TEAM   MAYE PARKINSON ZOX:[id number] DOB: October 10, 1955 DOA: 09/08/2013 PCP: Trevor Iha, MD  Admit HPI / Brief Narrative: 57 y.o. female with multiple medical problems including ESRD on HD who presented to the ED with complaints of right sided chest pain of acute onset. She reported having sharp pain in the right chest under her right breast area radiating into her back. She denied feeling any palpitations, but she did feel SOB. She was brought to the ED and was found to have new onset Atrial Fibrillation with RVR.   HPI/Subjective: She complains of significant back pain exacerbated by laying in bed.  She denies current chest pain shortness of breath fevers chills nausea or vomiting.    Assessment/Plan:  Newly diagnosed afib w/ RVR Rate control has been difficult and was further challenging due to hypotension -s/p TTE guided cardioversion tody - remains on IV Cardizem infusion - Cardiology following - heparin gtt >> warfarin - INR now theraputic-   Fever - beginning early this AM- WBC count normal thus far- no signs of flu or URI symptoms- no rash or abdominal symptoms - Unable to obtain UA via I and O cath - fistula appears normal - possible infection of right stump wound?-  - will follow temp and WBC count - hold off on starting antibiotics today- Blood cultures ordered -  if fevers continue, and no other source determined by tomorrow, would begin antibiotics to cover for wound infection  Thrombocytopenia - noted to start this past Aug- Platelets have been stable until the noted drop today which could be related to sepsis  DM2 CBGs stable  ESRD on HD M/W/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/p failed transplant Care as per Nephrology team -   OSA Cont home CPAP regimen  Ischemic CM - EF 25-30% Well compensated at present - volume managed per HD   Pulm HTN  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 rate 23, height 5\' 4"  (1.626 m), weight 96.7 kg (213 lb 3 oz), SpO2 93.00%.  Intake/Output Summary (Last 24 hours) at 09/11/13 1548 Last data filed at 09/11/13 1500  Gross per 24 hour  Intake  705.5 ml  Output   1566 ml  Net -860.5 ml   Exam: General: No acute respiratory distress at rest  Lungs: distant bs th/o all fields - mild bibasilar crackles - no wheeze  Cardiovascular: RRR w/o appreciable M - no rub  Abdomen: Obese, nontender, nondistended, soft, bowel sounds positive, no rebound, no ascites, no appreciable mass Extremities: R BKA and L AKA - R LE wound examined- mild yellow/white discharge on dressing- significant tenderness around wound  Data Reviewed: Basic Metabolic Panel:  Recent Labs Lab 09/08/13 1546 09/09/13 0530 09/10/13 0450  NA 131* 131* 128*  K 4.5 4.9 5.3*  CL 88* 90* 87*  CO2 32 27 25  GLUCOSE 185* 194* 142*  BUN 35* 41* 47*  CREATININE 4.50* 4.90* 5.79*  CALCIUM 9.5 9.2 9.0  PHOS  --   --  6.3*   Liver Function Tests:  Recent Labs Lab 09/10/13 0450  ALBUMIN 2.0*    CBC:  Recent Labs Lab 09/08/13 1546 09/08/13 2231 09/09/13 0530 09/10/13 0450 09/11/13 0500  WBC 9.6 9.5 7.5 6.8 6.9  HGB 16.0* 15.5* 16.1* 16.2* 15.3*  HCT 48.7* 46.0 48.2* 48.5* 46.5*  MCV 84.1 84.9 83.4 82.5 84.2  PLT 127* 122* 125* 123* 105*   Cardiac Enzymes:  Recent Labs Lab 09/08/13 1546 09/08/13 2231 09/09/13 0235  09/09/13 1420  TROPONINI <0.30 <0.30 <0.30 <0.30   BNP (last 3 results)  Recent Labs  09/08/13 1546  PROBNP 32871.0*   CBG:  Recent Labs Lab 09/10/13 1229 09/10/13 2138 09/11/13 0957 09/11/13 1040 09/11/13 1219  GLUCAP 150* 101* 107* 103* 114*    Recent Results (from the past 240 hour(s))  MRSA PCR SCREENING     Status: None   Collection Time    09/08/13  9:30 PM      Result Value Range Status   MRSA by PCR NEGATIVE  NEGATIVE Final   Comment:            The GeneXpert MRSA Assay (FDA     approved for NASAL specimens     only), is one component of a     comprehensive MRSA colonization     surveillance program. It is not     intended to diagnose MRSA     infection nor to guide or     monitor treatment for     MRSA infections.  CULTURE, BLOOD (ROUTINE X 2)     Status: None   Collection Time    09/08/13 11:53 PM      Result Value Range Status   Specimen Description BLOOD LEFT ARM   Final   Special Requests BOTTLES DRAWN AEROBIC ONLY 10CC   Final   Culture  Setup Time     Final   Value: 09/09/2013 14:18     Performed at [facility]   Culture     Final   Value:        BLOOD CULTURE RECEIVED NO GROWTH TO DATE CULTURE WILL BE HELD FOR 5 DAYS BEFORE ISSUING A FINAL NEGATIVE REPORT     Performed at [facility]   Report Status PENDING   Incomplete  CULTURE, BLOOD (ROUTINE X 2)     Status: None   Collection Time    09/08/13 11:59 PM      Result Value Range Status   Specimen Description BLOOD LEFT HAND   Final   Special Requests BOTTLES DRAWN AEROBIC ONLY 8CC   Final   Culture  Setup Time     Final   Value: 09/09/2013 14:18     Performed at [facility]   Culture     Final   Value:        BLOOD CULTURE RECEIVED NO GROWTH TO DATE CULTURE WILL BE HELD FOR 5 DAYS BEFORE ISSUING A FINAL NEGATIVE REPORT     Performed at [facility]   Report Status PENDING   Incomplete     Studies:  Recent x-ray studies have been reviewed in detail  by the Attending Physician  Scheduled Meds:  Scheduled Meds: . aspirin EC  81 mg Oral Daily  . atorvastatin  10 mg Oral QAC breakfast  . B-complex with vitamin C  1 tablet Oral Daily  .  cinacalcet  60 mg Oral Q breakfast  . feeding supplement (RESOURCE BREEZE)  1 Container Oral TID BM  . insulin aspart  0-15 Units Subcutaneous TID WC  . insulin aspart  3 Units Subcutaneous TID WC  . midodrine  10 mg Oral Q M,W,F  . multivitamin  1 tablet Oral QHS  . pregabalin  25 mg Oral BID  . sevelamer carbonate  1,600 mg Oral TID WC  . silver sulfADIAZINE   Topical BID  . sodium chloride  3 mL Intravenous Q12H  . warfarin  2 mg Oral ONCE-1800  . warfarin  5 mg Oral ONCE-1800  . warfarin   Does not apply Once  . Warfarin - Pharmacist Dosing Inpatient   Does not apply q1800    Time spent on care of this patient: 35 mins   Calvert Cantor, MD  [facility] Office  [phone] Pager - Text Page per Amion as per below:  On-Call/Text Page:      [email or web address]      password [email or web address]  If 7PM-7AM, please contact night-coverage [email or web address] Password [email or web address] 09/11/2013, 3:48 PM   LOS: 3 days

## 2013-09-11 NOTE — Anesthesia Postprocedure Evaluation (Signed)
  Anesthesia Post-op Note  Patient: [name]  Procedure(s) Performed: Procedure(s): TRANSESOPHAGEAL ECHOCARDIOGRAM (TEE) (N/A) CARDIOVERSION (N/A)  Patient Location: Endoscopy Unit  Anesthesia Type:General  Level of Consciousness: awake, alert  and oriented  Airway and Oxygen Therapy: Patient Spontanous Breathing and Patient connected to nasal cannula oxygen  Post-op Pain: none  Post-op Assessment: Post-op Vital signs reviewed, Patient's Cardiovascular Status Stable, Respiratory Function Stable, Patent Airway and No signs of Nausea or vomiting  Post-op Vital Signs: Reviewed and stable  Complications: No apparent anesthesia complications

## 2013-09-11 NOTE — Preoperative (Signed)
Beta Blockers   Reason not to administer Beta Blockers:Not Applicable 

## 2013-09-11 NOTE — Interval H&P Note (Signed)
History and Physical Interval Note:  09/11/2013 8:55 AM  [name]  has presented today for surgery, with the diagnosis of a fib  The various methods of treatment have been discussed with the patient and family. After consideration of risks, benefits and other options for treatment, the patient has consented to  Procedure(s): TRANSESOPHAGEAL ECHOCARDIOGRAM (TEE) (N/A) CARDIOVERSION (N/A) as a surgical intervention .  The patient's history has been reviewed, patient examined, no change in status, stable for surgery.  I have reviewed the patient's chart and labs.  Questions were answered to the patient's satisfaction.     Aloma Boch S.

## 2013-09-12 ENCOUNTER — Encounter (HOSPITAL_COMMUNITY): Payer: Self-pay | Admitting: Interventional Cardiology

## 2013-09-12 ENCOUNTER — Inpatient Hospital Stay (HOSPITAL_COMMUNITY): Payer: Medicare Other

## 2013-09-12 DIAGNOSIS — I2510 Atherosclerotic heart disease of native coronary artery without angina pectoris: Secondary | ICD-10-CM

## 2013-09-12 LAB — PROTIME-INR: INR: 2.8 — ABNORMAL HIGH (ref 0.00–1.49)

## 2013-09-12 LAB — GLUCOSE, CAPILLARY
Glucose-Capillary: 91 mg/dL (ref 70–99)
Glucose-Capillary: 50 mg/dL — ABNORMAL LOW (ref 70–99)
Glucose-Capillary: 82 mg/dL (ref 70–99)

## 2013-09-12 LAB — CBC
HCT: 46 % (ref 36.0–46.0)
Hemoglobin: 15.2 g/dL — ABNORMAL HIGH (ref 12.0–15.0)
RBC: 5.49 MIL/uL — ABNORMAL HIGH (ref 3.87–5.11)
WBC: 7.4 10*3/uL (ref 4.0–10.5)

## 2013-09-12 LAB — RENAL FUNCTION PANEL
Albumin: 1.9 g/dL — ABNORMAL LOW (ref 3.5–5.2)
BUN: 45 mg/dL — ABNORMAL HIGH (ref 6–23)
Calcium: 8.2 mg/dL — ABNORMAL LOW (ref 8.4–10.5)
Chloride: 95 mEq/L — ABNORMAL LOW (ref 96–112)
Creatinine, Ser: 5.92 mg/dL — ABNORMAL HIGH (ref 0.50–1.10)
Phosphorus: 6.9 mg/dL — ABNORMAL HIGH (ref 2.3–4.6)

## 2013-09-12 LAB — HEPARIN LEVEL (UNFRACTIONATED): Heparin Unfractionated: 0.64 IU/mL (ref 0.30–0.70)

## 2013-09-12 MED ORDER — NALOXONE HCL 0.4 MG/ML IJ SOLN
0.4000 mg | INTRAMUSCULAR | Status: DC | PRN
  Administered 2013-09-12 (×2): 0.4 mg via INTRAVENOUS

## 2013-09-12 MED ORDER — PIPERACILLIN-TAZOBACTAM IN DEX 2-0.25 GM/50ML IV SOLN
2.2500 g | Freq: Three times a day (TID) | INTRAVENOUS | Status: DC
  Administered 2013-09-12 – 2013-09-18 (×16): 2.25 g via INTRAVENOUS
  Filled 2013-09-12 (×20): qty 50

## 2013-09-12 MED ORDER — VANCOMYCIN HCL 10 G IV SOLR
2000.0000 mg | Freq: Once | INTRAVENOUS | Status: AC
  Administered 2013-09-12: 2000 mg via INTRAVENOUS
  Filled 2013-09-12: qty 2000

## 2013-09-12 MED ORDER — PIPERACILLIN-TAZOBACTAM 3.375 G IVPB 30 MIN: 3.3750 g | Freq: Three times a day (TID) | INTRAVENOUS | Status: DC

## 2013-09-12 MED ORDER — SODIUM CHLORIDE 0.9 % IV BOLUS (SEPSIS)
500.0000 mL | Freq: Once | INTRAVENOUS | Status: AC
  Administered 2013-09-12: 500 mL via INTRAVENOUS

## 2013-09-12 MED ORDER — SODIUM CHLORIDE 0.9 % IV BOLUS (SEPSIS)
250.0000 mL | Freq: Once | INTRAVENOUS | Status: AC
  Administered 2013-09-12: 250 mL via INTRAVENOUS

## 2013-09-12 MED ORDER — AMIODARONE HCL 200 MG PO TABS
200.0000 mg | ORAL_TABLET | Freq: Three times a day (TID) | ORAL | Status: DC
  Administered 2013-09-12 – 2013-09-14 (×5): 200 mg via ORAL
  Filled 2013-09-12 (×7): qty 1

## 2013-09-12 MED ORDER — MIDODRINE HCL 5 MG PO TABS
10.0000 mg | ORAL_TABLET | Freq: Two times a day (BID) | ORAL | Status: DC
  Administered 2013-09-12 – 2013-09-15 (×7): 10 mg via ORAL
  Filled 2013-09-12 (×9): qty 2

## 2013-09-12 MED ORDER — VANCOMYCIN HCL IN DEXTROSE 1-5 GM/200ML-% IV SOLN
1000.0000 mg | INTRAVENOUS | Status: DC
  Filled 2013-09-12: qty 200

## 2013-09-12 MED ORDER — NALOXONE HCL 0.4 MG/ML IJ SOLN
INTRAMUSCULAR | Status: AC
  Administered 2013-09-12: 0.4 mg via INTRAVENOUS
  Filled 2013-09-12: qty 1

## 2013-09-12 NOTE — Progress Notes (Signed)
Hemodialysis-See Flowsheet Total UF 4.5L during HD. Pt tolerated well. BP ranged from 70-90s systolic throughout. Pt remainined oriented. Much more alert now but still states "im sleepy". Transferred back to 2H in stable condition. BP 93/46 post treatment

## 2013-09-12 NOTE — Progress Notes (Signed)
CTSP re:  Temp 102, and hypotension. She had a CXR a couple of days ago, could have had PNA, but clinically not having any symptoms, so antibiotics were held.  She had dialysis with 4.5 L removal.  She has hx of low BP usually.  She also has a stump which could be infected.  She is on coumadin, therapeutic for afib, and was given cardiac meds which may aggravate her BP. She is alert and is rather asymptomatic when I saw her.  Her lungs were clear.  She mentated well and sat up on her own. Will repeat BCx2, repeat portable CXR, Bolus NX 500cc, Start Van/zosyn per pharmacy.  Will avoid pressor at this time, as she looks quite well.  Will watch her closely.

## 2013-09-12 NOTE — Progress Notes (Signed)
[facility] Progress Note [name] - Stepdown/ICU TEAM   KASSONDRA Rocha ZOX:[id number] DOB: [birth year] DOA: 09/08/2013 PCP: Trevor Iha, MD  Admit HPI / Brief Narrative: 57 y.o. female with multiple medical problems including ESRD on HD who presented to the ED with complaints of right sided chest pain of acute onset. She reported having sharp pain in the right chest under her right breast area radiating into her back. She denied feeling any palpitations, but she did feel SOB. She was brought to the ED and was found to have new onset Atrial Fibrillation with RVR.   HPI/Subjective: The patient is seen during her dialysis treatment.  She denies chest pain.  She denies shortness of breath.  She reports that she has a poor appetite.  Assessment/Plan:  Newly diagnosed afib w/ RVR Rate control has been difficult and was further challenging due to hypotension - s/p TEE guided cardioversion 12/30 - remains on IV Cardizem infusion - appears to be in afib at time of exam today  - Cardiology following - heparin gtt >> warfarin - INR now therapeutic therefore stop heparin   Fever (low grade Tmax 100.6) - developed 12/30 AM - WBC remains normal thus far - no signs of flu or URI symptoms - no rash or abdominal symptoms - fistula appears normal - possible infection of right stump wound? 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 rate 20, height 5\' 4"  (Rocha.626 m), weight 98.6 kg (217 lb 6 oz), SpO2 95.00%.  Intake/Output Summary (Last 24 hours) at 09/12/13 1245 Last data filed at 09/12/13 0900  Gross per 24 hour  Intake  704.5 ml  Output      0 ml  Net  704.5 ml   Exam: General: No acute respiratory distress at rest  Lungs: distant bs th/o all fields - mild bibasilar crackles - no wheeze  Cardiovascular: irreg irreg - rate controlled - w/o appreciable M - no rub  Abdomen: Obese, nontender, nondistended, soft, bowel sounds positive, no rebound, no ascites, no appreciable mass Extremities: R BKA and L AKA - R LE wound dressed and dry   Data Reviewed: Basic Metabolic Panel:  Recent Labs Lab 09/08/13 1546 09/09/13 0530 09/10/13 0450 09/12/13 1026  NA 131* 131* 128* 135*  K 4.5 4.9 5.3* 5.3  CL 88* 90* 87* 95*  CO2 32 27 25 23   GLUCOSE 185* 194* 142* 145*  BUN 35* 41*  47* 45*  CREATININE 4.50* 4.90* 5.79* 5.92*  CALCIUM 9.5 9.2 9.0 8.2*  PHOS  --   --  6.3* 6.9*   Liver Function Tests:  Recent Labs Lab 09/10/13 0450 09/12/13 1026  ALBUMIN 2.0* Rocha.9*    CBC:  Recent Labs Lab 09/08/13 2231 09/09/13 0530 09/10/13 0450 09/11/13 0500 09/12/13 0434  WBC 9.5 7.5 6.8 6.9 7.4  HGB 15.5* 16.Rocha* 16.2* 15.3* 15.2*  HCT 46.0 48.2* 48.5* 46.5* 46.0  MCV 84.9 83.4 82.5 84.2 83.8  PLT 122* 125* 123* 105* 91*   Cardiac Enzymes:  Recent Labs Lab 09/08/13 1546 09/08/13 2231 09/09/13 0235 09/09/13 1420  TROPONINI <0.30 <0.30  <0.30 <0.30   BNP (last 3 results)  Recent Labs  09/08/13 1546  PROBNP 32871.0*   CBG:  Recent Labs Lab 09/11/13 1040 09/11/13 1219 09/11/13 1644 09/11/13 2110 09/12/13 0736  GLUCAP 103* 114* 115* 144* 98    Recent Results (from the past 240 hour(s))  MRSA PCR SCREENING     Status: None   Collection Time    09/08/13  9:30 PM      Result Value Range Status   MRSA by PCR NEGATIVE  NEGATIVE Final   Comment:            The GeneXpert MRSA Assay (FDA     approved for NASAL specimens     only), is one component of a     comprehensive MRSA colonization     surveillance program. It is not     intended to diagnose MRSA     infection nor to guide or     monitor treatment for     MRSA infections.  CULTURE, BLOOD (ROUTINE X 2)     Status: None   Collection Time    09/08/13 11:53 PM      Result Value Range Status   Specimen Description BLOOD LEFT ARM   Final   Special Requests BOTTLES DRAWN AEROBIC ONLY 10CC   Final   Culture  Setup Time     Final   Value: 09/09/2013 14:18     Performed at [facility]   Culture     Final   Value:        BLOOD CULTURE RECEIVED NO GROWTH TO DATE CULTURE WILL BE HELD FOR 5 DAYS BEFORE ISSUING A FINAL NEGATIVE REPORT     Performed at [facility]   Report Status PENDING   Incomplete  CULTURE, BLOOD (ROUTINE X 2)     Status: None   Collection Time    09/08/13 11:59 PM      Result Value Range Status   Specimen Description BLOOD LEFT HAND   Final   Special Requests BOTTLES DRAWN AEROBIC ONLY 8CC   Final   Culture  Setup Time     Final   Value: 09/09/2013 14:18     Performed at [facility]   Culture     Final   Value:        BLOOD CULTURE RECEIVED NO GROWTH TO DATE CULTURE WILL BE HELD FOR 5 DAYS BEFORE ISSUING A FINAL NEGATIVE REPORT     Performed at [facility]   Report Status PENDING   Incomplete  CULTURE, BLOOD (ROUTINE X 2)     Status: None   Collection Time    09/11/13  5:30 PM      Result Value  Range Status   Specimen Description BLOOD LEFT HAND   Final   Special  Requests     Final   Value: BOTTLES DRAWN AEROBIC AND ANAEROBIC 10CC AER,5CC ANA   Culture  Setup Time     Final   Value: 09/11/2013 23:38     Performed at [facility]   Culture     Final   Value:        BLOOD CULTURE RECEIVED NO GROWTH TO DATE CULTURE WILL BE HELD FOR 5 DAYS BEFORE ISSUING A FINAL NEGATIVE REPORT     Performed at [facility]   Report Status PENDING   Incomplete  CULTURE, BLOOD (ROUTINE X 2)     Status: None   Collection Time    09/11/13  5:40 PM      Result Value Range Status   Specimen Description BLOOD LEFT FOREARM   Final   Special Requests BOTTLES DRAWN AEROBIC ONLY 6CC   Final   Culture  Setup Time     Final   Value: 09/11/2013 23:39     Performed at [facility]   Culture     Final   Value:        BLOOD CULTURE RECEIVED NO GROWTH TO DATE CULTURE WILL BE HELD FOR 5 DAYS BEFORE ISSUING A FINAL NEGATIVE REPORT     Performed at [facility]   Report Status PENDING   Incomplete     Studies:  Recent x-ray studies have been reviewed in detail by the Attending Physician  Scheduled Meds:  Scheduled Meds: . aspirin EC  81 mg Oral Daily  . atorvastatin  10 mg Oral QAC breakfast  . B-complex with vitamin C  Rocha tablet Oral Daily  . cinacalcet  60 mg Oral Q breakfast  . feeding supplement (RESOURCE BREEZE)  Rocha Container Oral TID BM  . insulin aspart  0-15 Units Subcutaneous TID WC  . insulin aspart  3 Units Subcutaneous TID WC  . midodrine  10 mg Oral BID  . multivitamin  Rocha tablet Oral QHS  . sevelamer carbonate  Rocha,600 mg Oral TID WC  . silver sulfADIAZINE   Topical BID  . sodium chloride  3 mL Intravenous Q12H  . warfarin  2 mg Oral ONCE-1800  . warfarin   Does not apply Once  . Warfarin - Pharmacist Dosing Inpatient   Does not apply q1800    Time spent on care of this patient: 25 mins   [facility] T, MD  [facility] Office   [phone] Pager - Text Page per Amion as per below:  On-Call/Text Page:      [email or web address]      password [email or web address]  If 7PM-7AM, please contact night-coverage [email or web address] Password [email or web address] 09/12/2013, 12:45 PM   LOS: 4 days

## 2013-09-12 NOTE — Progress Notes (Addendum)
    Subjective:  Tired since procedure.  Spoke to Dr. Arlean Hopping.  Patient is quite fluid overloaded.   Objective:  Filed Vitals:   09/12/13 1030 09/12/13 1035 09/12/13 1052 09/12/13 1123  BP: 91/50 81/57 85/57  88/45  Pulse: 113 107 114 116  Temp: 99.1 F (37.3 C)     TempSrc: Oral     Resp: 23 22 20    Height:      Weight: 217 lb 6 oz (98.6 kg)     SpO2: 92% 93%  93%    Intake/Output from previous day:  Intake/Output Summary (Last 24 hours) at 09/12/13 1154 Last data filed at 09/12/13 0900  Gross per 24 hour  Intake    739 ml  Output      0 ml  Net    739 ml    Physical Exam: Physical exam: Somnolent Skin is warm and dry.  HEENT is normal.  Neck is supple.  Chest with mild basilar crackles.  tachycardic  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; 1+ edema on right; dressing in place     Lab Results: Basic Metabolic Panel:  Recent Labs  16/10/96 0450 09/12/13 1026  NA 128* 135*  K 5.3* 5.3  CL 87* 95*  CO2 25 23  GLUCOSE 142* 145*  BUN 47* 45*  CREATININE 5.79* 5.92*  CALCIUM 9.0 8.2*  PHOS 6.3* 6.9*   CBC:  Recent Labs  09/11/13 0500 09/12/13 0434  WBC 6.9 7.4  HGB 15.3* 15.2*  HCT 46.5* 46.0  MCV 84.2 83.8  PLT 105* 91*   Cardiac Enzymes:  Recent Labs  09/09/13 1420  TROPONINI <0.30     Assessment/Plan:  1 atrial fibrillation-sinus tachycardia with prolonged PR after TEE guided cardioversion yesterday. If she did not hold sinus rhythm she will most likely need amiodarone as her blood pressure is borderline and we have little room to increase AV nodal blocking agents.  Coumadin therapeutic.  COuld stop IV heparin.  2  chronic end-stage renal disease-management per nephrology.  May be cause of encephelopathic behavior 3 peripheral vascular disease status post right BKA-wound care should follow. 4 ischemic cardiomyopathy-continue statin. Add low-dose beta blocker later when she is in sinus rhythm and Cardizem can be discontinued. Medications will  be limited by blood pressure.  Biventricular heart failure. 5 hyperlipidemia-continue statin.  Agostino Gorin S. 09/12/2013, 11:54 AM

## 2013-09-12 NOTE — Progress Notes (Signed)
Pt refusing CPAP qHS.

## 2013-09-12 NOTE — Procedures (Signed)
I was present at this dialysis session, have reviewed the session itself and made  appropriate changes  Vinson Moselle MD (pgr) [phone]    [phone] 09/12/2013, 12:17 PM

## 2013-09-12 NOTE — Progress Notes (Signed)
Pt restless in bed. Confused. Pulling off oxygen. Dr. Conley Rolls returned call and made aware. 2nd 500 NS bolus started.

## 2013-09-12 NOTE — Progress Notes (Signed)
ANTIBIOTIC CONSULT NOTE - INITIAL  Pharmacy Consult for vancomycin Indication: sepsis  Allergies  Allergen Reactions  . Neurontin [Gabapentin] Anaphylaxis  . Ezetimibe-Simvastatin Other (See Comments)    REACTION: face edema; itching  . Klonopin [Clonazepam] Other (See Comments)    hallucination  . Sertraline Hcl Other (See Comments)    hallucination    Patient Measurements: Height: 5\' 4"  (162.6 cm) Weight: 207 lb 3.7 oz (94 kg) IBW/kg (Calculated) : 54.7 Adjusted Body Weight:   Vital Signs: Temp: 102.8 F (39.3 C) (12/31 2000) Temp src: Oral (12/31 2000) BP: 104/45 mmHg (12/31 2000) Pulse Rate: 72 (12/31 1440) Intake/Output from previous day: 12/30 0701 - 12/31 0700 In: 898 [P.O.:120; I.V.:778] Out: -  Intake/Output from this shift:    Labs:  Recent Labs  09/10/13 0450 09/11/13 0500 09/12/13 0434 09/12/13 1026  WBC 6.8 6.9 7.4  --   HGB 16.2* 15.3* 15.2*  --   PLT 123* 105* 91*  --   CREATININE 5.79*  --   --  5.92*   Estimated Creatinine Clearance: 11.5 ml/min (by C-G formula based on Cr of 5.92). No results found for this basename: VANCOTROUGH, Leodis Binet, VANCORANDOM, GENTTROUGH, GENTPEAK, GENTRANDOM, TOBRATROUGH, TOBRAPEAK, TOBRARND, AMIKACINPEAK, AMIKACINTROU, AMIKACIN,  in the last 72 hours   Microbiology: Recent Results (from the past 720 hour(s))  MRSA PCR SCREENING     Status: None   Collection Time    09/08/13  9:30 PM      Result Value Range Status   MRSA by PCR NEGATIVE  NEGATIVE Final   Comment:            The GeneXpert MRSA Assay (FDA     approved for NASAL specimens     only), is one component of a     comprehensive MRSA colonization     surveillance program. It is not     intended to diagnose MRSA     infection nor to guide or     monitor treatment for     MRSA infections.  CULTURE, BLOOD (ROUTINE X 2)     Status: None   Collection Time    09/08/13 11:53 PM      Result Value Range Status   Specimen Description BLOOD LEFT ARM    Final   Special Requests BOTTLES DRAWN AEROBIC ONLY 10CC   Final   Culture  Setup Time     Final   Value: 09/09/2013 14:18     Performed at [facility]   Culture     Final   Value:        BLOOD CULTURE RECEIVED NO GROWTH TO DATE CULTURE WILL BE HELD FOR 5 DAYS BEFORE ISSUING A FINAL NEGATIVE REPORT     Performed at [facility]   Report Status PENDING   Incomplete  CULTURE, BLOOD (ROUTINE X 2)     Status: None   Collection Time    09/08/13 11:59 PM      Result Value Range Status   Specimen Description BLOOD LEFT HAND   Final   Special Requests BOTTLES DRAWN AEROBIC ONLY 8CC   Final   Culture  Setup Time     Final   Value: 09/09/2013 14:18     Performed at [facility]   Culture     Final   Value:        BLOOD CULTURE RECEIVED NO GROWTH TO DATE CULTURE WILL BE HELD FOR 5 DAYS BEFORE ISSUING A FINAL NEGATIVE REPORT  Performed at [facility]   Report Status PENDING   Incomplete  CULTURE, BLOOD (ROUTINE X 2)     Status: None   Collection Time    09/11/13  5:30 PM      Result Value Range Status   Specimen Description BLOOD LEFT HAND   Final   Special Requests     Final   Value: BOTTLES DRAWN AEROBIC AND ANAEROBIC 10CC AER,5CC ANA   Culture  Setup Time     Final   Value: 09/11/2013 23:38     Performed at [facility]   Culture     Final   Value:        BLOOD CULTURE RECEIVED NO GROWTH TO DATE CULTURE WILL BE HELD FOR 5 DAYS BEFORE ISSUING A FINAL NEGATIVE REPORT     Performed at [facility]   Report Status PENDING   Incomplete  CULTURE, BLOOD (ROUTINE X 2)     Status: None   Collection Time    09/11/13  5:40 PM      Result Value Range Status   Specimen Description BLOOD LEFT FOREARM   Final   Special Requests BOTTLES DRAWN AEROBIC ONLY 6CC   Final   Culture  Setup Time     Final   Value: 09/11/2013 23:39     Performed at [facility]   Culture     Final   Value:        BLOOD CULTURE RECEIVED NO GROWTH TO DATE  CULTURE WILL BE HELD FOR 5 DAYS BEFORE ISSUING A FINAL NEGATIVE REPORT     Performed at [facility]   Report Status PENDING   Incomplete    Medical History: Past Medical History  Diagnosis Date  . Diabetes mellitus, type 2   . Secondary hyperparathyroidism   . OSA (obstructive sleep apnea)     NPSG 11.23.11-AHI 64.9/hr-central and obstructive apnea  . Ischemic cardiomyopathy     EF 25-30% 2011  . CAD (coronary artery disease)   . Pulmonary hypertension     Est PAsyst 60-Dr Mendel Ryder, off coumadin, CT neg for PE  . Hyperlipidemia   . Anemia   . Thyroid disease   . Pulmonary edema   . Peripheral vascular disease   . Sebaceous cyst   . 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 Velvet Bathe  . Irregular heart beat   . Shortness of breath     with exertion  . Hemodialysis patient     M,W,F  . TIA (transient ischemic attack) June 2014  . Ulcer of heel due to diabetes     left  . Kidney disease Dialysis MWF    ESRD secondary to DM, failed transplant 2006. Dialysis    Medications:  Scheduled:  . amiodarone  200 mg Oral TID  . aspirin EC  81 mg Oral Daily  . atorvastatin  10 mg Oral QAC breakfast  . B-complex with vitamin C  1 tablet Oral Daily  . cinacalcet  60 mg Oral Q breakfast  . feeding supplement (RESOURCE BREEZE)  1 Container Oral TID BM  . insulin aspart  0-15 Units Subcutaneous TID WC  . insulin aspart  3 Units Subcutaneous TID WC  . midodrine  10 mg Oral BID  . multivitamin  1 tablet Oral QHS  . piperacillin-tazobactam (ZOSYN)  IV  2.25 g Intravenous Q8H  . sevelamer carbonate  1,600 mg Oral TID WC  . silver sulfADIAZINE   Topical BID  .  sodium chloride  500 mL Intravenous Once  . warfarin   Does not apply Once  . Warfarin - Pharmacist Dosing Inpatient   Does not apply q1800   Infusions:  . sodium chloride 10 mL/hr (09/11/13 0502)  . diltiazem (CARDIZEM) infusion Stopped (09/12/13 5621)   Assessment: 57 yo female with sepsis will be started on  vancomycin.  Patient has ESRD and usual HD is MWF.  Last HD today.  Wt 94 kg  Goal of Therapy:  Pre-HD vancomycin level 15-25 mcg/mL  Plan:  1) Vancomycin 2g iv x1, then 1g iv HD MWF 2) Follow HD schedule to make sure that it will be MWF.  Vineeth Fell, Tsz-Yin 09/12/2013,9:15 PM

## 2013-09-12 NOTE — Progress Notes (Signed)
[facility] Progress Note    Subjective: Lethargic and jerking extremities today, "can't stay awake". Had sedation for procedure yest, also propofol x 1   Exam  Blood pressure 92/45, pulse 111, temperature 99.3 F (37.4 C), temperature source Oral, resp. rate 22, height 5\' 4"  (1.626 m), weight 95 kg (209 lb 7 oz), SpO2 95.00%. 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  EDW 85.5 kg HD Bath 2.0 k / 2.25 ca Time 4hrs Heparin 2500. Access l U A AVF BFR 400 DFR 800  Hectorol 2 mcg IV/HD Epogen 0 Units IV/HD Venofer 0 Op Labs= Last hgb 14.4 op 08/29/13 and tfs 34% 08/22/13 lasrt pth 145 08/22/13   Assessment/Plan  1. New Atrial fib with RVR= s/p cardioversion, on heparin 2. Lethargy / asterixis: c/w encephalopathic state > will give narcan x 1, stopped all potentially sedating meds and will dialyze this am, should improve with these measures 3. ESRD: HD today 4. Chronic hypOtension/volume: on midodrine, I have increased the dose; she is vol overloaded significantly (10kg up by wts), plan max UF today and additional HD as needed to get her down to dry wt 5. Low grade fever: 4 blood cx's ngtd, access is benign  6. Anemia - No ESA with hgb >12/ no iron 7. Metabolic bone disease - Hold Hectorol with mild Hypercalcemia corrected to 11.0 phos 6.3/ Renvela binder with meals 8. Nutrition - renal carb mod / renal vit 9. PVD with R BKA wound dressing changes daily / silvadene    Vinson Moselle MD  pager [phone]    cell [phone]  09/12/2013, 8:55 AM   Recent Labs Lab 09/08/13 1546 09/09/13 0530 09/10/13 0450  NA 131* 131* 128*  K 4.5 4.9 5.3*  CL 88* 90* 87*  CO2 32 27 25  GLUCOSE 185* 194* 142*  BUN 35* 41* 47*  CREATININE 4.50* 4.90* 5.79*  CALCIUM 9.5 9.2  9.0  PHOS  --   --  6.3*    Recent Labs Lab 09/10/13 0450  ALBUMIN 2.0*    Recent Labs Lab 09/10/13 0450 09/11/13 0500 09/12/13 0434  WBC 6.8 6.9 7.4  HGB 16.2* 15.3* 15.2*  HCT 48.5* 46.5* 46.0  MCV 82.5 84.2 83.8  PLT 123* 105* 91*   . aspirin EC  81 mg Oral Daily  . atorvastatin  10 mg Oral QAC breakfast  . B-complex with vitamin C  1 tablet Oral Daily  . cinacalcet  60 mg Oral Q breakfast  . feeding supplement (RESOURCE BREEZE)  1 Container Oral TID BM  . insulin aspart  0-15 Units Subcutaneous TID WC  . insulin aspart  3 Units Subcutaneous TID WC  . midodrine  10 mg Oral Q M,W,F  . multivitamin  1 tablet Oral QHS  . sevelamer carbonate  1,600 mg Oral TID WC  . silver sulfADIAZINE   Topical BID  . sodium chloride  3 mL Intravenous Q12H  . warfarin  2 mg Oral ONCE-1800  . warfarin   Does not apply Once  . Warfarin - Pharmacist Dosing Inpatient   Does not apply q1800   . sodium chloride 10 mL/hr (09/11/13 0502)  . diltiazem (CARDIZEM) infusion Stopped (09/12/13 0509)  . heparin 1,450 Units/hr (09/12/13 0800)  sodium chloride, acetaminophen, acetaminophen, naLOXone (NARCAN)  injection, ondansetron (ZOFRAN) IV, ondansetron, sodium chloride

## 2013-09-12 NOTE — Progress Notes (Signed)
500 ml bolus complete. Pt in Afib. Page sent to Dr. Conley Rolls

## 2013-09-12 NOTE — Progress Notes (Signed)
ANTICOAGULATION CONSULT NOTE - Follow Up Consult  Pharmacy Consult for Heparin  Indication: atrial fibrillation  Allergies  Allergen Reactions  . Neurontin [Gabapentin] Anaphylaxis  . Ezetimibe-Simvastatin Other (See Comments)    REACTION: face edema; itching  . Klonopin [Clonazepam] Other (See Comments)    hallucination  . Sertraline Hcl Other (See Comments)    hallucination    Patient Measurements: Height: 5\' 4"  (162.6 cm) Weight: 209 lb 7 oz (95 kg) IBW/kg (Calculated) : 54.7  Vital Signs: Temp: 99.3 F (37.4 C) (12/31 0735) Temp src: Oral (12/31 0735) BP: 92/45 mmHg (12/31 0800) Pulse Rate: 111 (12/31 0400)  Labs:  Recent Labs  09/09/13 1420  09/10/13 0450 09/11/13 0500 09/12/13 0434  HGB  --   < > 16.2* 15.3* 15.2*  HCT  --   --  48.5* 46.5* 46.0  PLT  --   --  123* 105* 91*  LABPROT  --   --  17.0* 22.5* 28.5*  INR  --   --  1.42 2.05* 2.80*  HEPARINUNFRC  --   < > 0.42 0.47 0.64  CREATININE  --   --  5.79*  --   --   TROPONINI <0.30  --   --   --   --   < > = values in this interval not displayed.  Estimated Creatinine Clearance: 11.8 ml/min (by C-G formula based on Cr of 5.79).   Medications:  Heparin 1450 units/hr  Assessment: 57 y/o F on heparin/coumadin for afib. Heparin is at goal (HL= 0.64) on 1450 units/hr and INR= 2.8 (coumadin started on 12/28; baseline INR= 1.33). Patient noted s/p TEE guided cardioversion to NSR on 09/11/13. Coumadin was not charted 12/29 (not clear if given) and not given 12/30 as pt was drowsy.  Only one dose of coumadin has been charted as given and INR is 2.8 with trend up. In the past LFTS have been WNL, recent fever could contribute to coumadin sensitivity.  Goal of Therapy:  Heparin level 0.3-0.7 units/ml Monitor platelets by anticoagulation protocol: Yes   Plan:  -No heparin rate changes needed -Daily CBC/HL -No coumadin today due to continued INR trend up with last coumadin documented on 09/09/13. -Daily  PT/INR  Harland German, Pharm D 09/12/2013 9:40 AM

## 2013-09-12 NOTE — Progress Notes (Addendum)
Pt with temp 102. SBP in 60's-70's. ST on monitor with runs of SVT. Pt awake, speech slurred. Family at bedside. Dr. Conley Rolls notified, and will  come see patient.

## 2013-09-13 ENCOUNTER — Encounter (HOSPITAL_COMMUNITY): Payer: Self-pay | Admitting: Emergency Medicine

## 2013-09-13 ENCOUNTER — Inpatient Hospital Stay (HOSPITAL_COMMUNITY): Payer: Medicare Other

## 2013-09-13 DIAGNOSIS — I2589 Other forms of chronic ischemic heart disease: Secondary | ICD-10-CM

## 2013-09-13 LAB — CBC
HCT: 43.1 % (ref 36.0–46.0)
Hemoglobin: 14 g/dL (ref 12.0–15.0)
MCH: 27.6 pg (ref 26.0–34.0)
MCHC: 32.5 g/dL (ref 30.0–36.0)
MCV: 84.8 fL (ref 78.0–100.0)
Platelets: 77 10*3/uL — ABNORMAL LOW (ref 150–400)
RBC: 5.08 MIL/uL (ref 3.87–5.11)
RDW: 16.8 % — ABNORMAL HIGH (ref 11.5–15.5)
WBC: 8.3 10*3/uL (ref 4.0–10.5)

## 2013-09-13 LAB — GLUCOSE, CAPILLARY
GLUCOSE-CAPILLARY: 151 mg/dL — AB (ref 70–99)
Glucose-Capillary: 129 mg/dL — ABNORMAL HIGH (ref 70–99)
Glucose-Capillary: 60 mg/dL — ABNORMAL LOW (ref 70–99)
Glucose-Capillary: 71 mg/dL (ref 70–99)
Glucose-Capillary: 86 mg/dL (ref 70–99)

## 2013-09-13 LAB — PROTIME-INR
INR: 3.12 — ABNORMAL HIGH (ref 0.00–1.49)
Prothrombin Time: 31 seconds — ABNORMAL HIGH (ref 11.6–15.2)

## 2013-09-13 LAB — RENAL FUNCTION PANEL
Albumin: 1.7 g/dL — ABNORMAL LOW (ref 3.5–5.2)
BUN: 26 mg/dL — ABNORMAL HIGH (ref 6–23)
CALCIUM: 7.4 mg/dL — AB (ref 8.4–10.5)
CO2: 26 mEq/L (ref 19–32)
Chloride: 97 mEq/L (ref 96–112)
Creatinine, Ser: 4.03 mg/dL — ABNORMAL HIGH (ref 0.50–1.10)
GFR calc Af Amer: 13 mL/min — ABNORMAL LOW (ref >90)
GFR, EST NON AFRICAN AMERICAN: 11 mL/min — AB (ref >90)
GLUCOSE: 157 mg/dL — AB (ref 70–99)
PHOSPHORUS: 4.5 mg/dL (ref 2.3–4.6)
Potassium: 3.8 mEq/L (ref 3.7–5.3)
SODIUM: 136 meq/L — AB (ref 137–147)

## 2013-09-13 LAB — LACTIC ACID, PLASMA
Lactic Acid, Venous: 1.9 mmol/L (ref 0.5–2.2)
Lactic Acid, Venous: 1.2 mmol/L (ref 0.5–2.2)

## 2013-09-13 LAB — INFLUENZA PANEL BY PCR (TYPE A & B)
H1N1FLUPCR: NOT DETECTED
INFLAPCR: NEGATIVE
Influenza B By PCR: NEGATIVE

## 2013-09-13 MED ORDER — SODIUM CHLORIDE 0.9 % IV SOLN: 100.0000 mL | INTRAVENOUS | Status: DC | PRN

## 2013-09-13 MED ORDER — SODIUM CHLORIDE 0.9 % IV BOLUS (SEPSIS): 500.0000 mL | Freq: Once | INTRAVENOUS | Status: DC

## 2013-09-13 MED ORDER — SODIUM CHLORIDE 0.9 % IV BOLUS (SEPSIS)
500.0000 mL | Freq: Once | INTRAVENOUS | Status: AC
  Administered 2013-09-13: 500 mL via INTRAVENOUS

## 2013-09-13 MED ORDER — NEPRO/CARBSTEADY PO LIQD
237.0000 mL | ORAL | Status: DC | PRN
  Filled 2013-09-13: qty 237

## 2013-09-13 MED ORDER — LIDOCAINE HCL (PF) 1 % IJ SOLN: 5.0000 mL | INTRAMUSCULAR | Status: DC | PRN

## 2013-09-13 MED ORDER — DEXTROSE 5 % IV SOLN
2.0000 ug/min | INTRAVENOUS | Status: DC
  Administered 2013-09-13: 2 ug/min via INTRAVENOUS
  Filled 2013-09-13: qty 8

## 2013-09-13 MED ORDER — ALTEPLASE 2 MG IJ SOLR
2.0000 mg | Freq: Once | INTRAMUSCULAR | Status: AC | PRN
  Filled 2013-09-13: qty 2

## 2013-09-13 MED ORDER — LIDOCAINE-PRILOCAINE 2.5-2.5 % EX CREA
1.0000 "application " | TOPICAL_CREAM | CUTANEOUS | Status: DC | PRN
  Filled 2013-09-13: qty 5

## 2013-09-13 MED ORDER — DEXTROSE 5 % IV SOLN
2.0000 ug/min | INTRAVENOUS | Status: DC
  Administered 2013-09-13: 8 ug/min via INTRAVENOUS
  Filled 2013-09-13 (×4): qty 16

## 2013-09-13 MED ORDER — PENTAFLUOROPROP-TETRAFLUOROETH EX AERO: 1.0000 "application " | INHALATION_SPRAY | CUTANEOUS | Status: DC | PRN

## 2013-09-13 MED ORDER — HEPARIN SODIUM (PORCINE) 1000 UNIT/ML DIALYSIS
1000.0000 [IU] | INTRAMUSCULAR | Status: DC | PRN
  Filled 2013-09-13: qty 1

## 2013-09-13 NOTE — Consult Note (Signed)
Name: [name] MRN: [id number] DOB: Mar 12, 1956    ADMISSION DATE:  09/08/2013 CONSULTATION DATE:  09/13/2013  REFERRING MD :  Orvan Falconer  CHIEF COMPLAINT:  Low blood pressure  BRIEF PATIENT DESCRIPTION:  58 yo female admitted with Rt sided chest pain and found to have A fib with RVR.  She developed new fever 12/30, and hypotension with decreased mental status after hemodialysis 12/31.  Transferred to ICU 1/01 and PCCM assumed care.  SIGNIFICANT EVENTS: 12/27 Admit, cardiology consulted 12/29 Renal consulted 12/30 Cardioversion, fever 12/31 Hypotension after HD 01/01 Transfer to ICU, PCCM assume care  STUDIES:  12/28 Echo >> EF 30 to 35%, mild MR, mild LA dilation, severe RV systolic dysfx  LINES / TUBES: PIV Rt upper arm AV graft  CULTURES: 12/27 Blood >> 12/30 Blood >>  12/31 Blood >>  ANTIBIOTICS: 12/31 Vancomycin >> 12/31 Zosyn >>   HISTORY OF PRESENT ILLNESS:   58 yo female admitted with chest pain and found to have new onset a fib with RVR.  She has hx of ischemic CM and ESRD, and normally has low blood pressure with systolics in [id number].  She had cardioversion done on 12/30.  Later that day she developed fever, and this has intermittently recurred.  She was noted to have progressive infiltrate on CXR, and she was started on antibiotics.  She had dialysis on 12/31 with 4.5 liters removed.  She developed persistent hypotension afterward.   She c/o feeling lightheaded.  Her chest pain is not as bad compared to admission.  Her breathing feels okay, but she has some chest congestion.  She has not been coughing up sputum  She denies abdominal pain or nausea.  Bedside staff notes pt appears more confused compared to baseline.  She received total of 1 liter IV fluid w/o improvement in blood pressure.  PCCM consulted to further assess.  PAST MEDICAL HISTORY :  Past Medical History  Diagnosis Date  . Diabetes mellitus, type 2   . Secondary hyperparathyroidism   . OSA  (obstructive sleep apnea)     NPSG 11.23.11-AHI 64.9/hr-central and obstructive apnea  . Ischemic cardiomyopathy     EF 25-30% 2011  . CAD (coronary artery disease)   . Pulmonary hypertension     Est PAsyst 60-Dr Linard Millers, off coumadin, CT neg for PE  . Hyperlipidemia   . Anemia   . Thyroid disease   . Pulmonary edema   . Peripheral vascular disease   . Sebaceous cyst   . 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 Richardson Dopp  . Irregular heart beat   . Shortness of breath     with exertion  . Hemodialysis patient     M,W,F  . TIA (transient ischemic attack) June 2014  . Ulcer of heel due to diabetes     left  . Kidney disease Dialysis MWF    ESRD secondary to DM, failed transplant 2006. Dialysis   Past Surgical History  Procedure Laterality Date  . Coronary artery bypass graft  2011    3V  . Insertion of dialysis catheter    . 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 Nasal mucosal biopsy    . Ovarian cyst surgery    . 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 Laser retinal surgery  2011  . Kidney transplant      rejection  . Abdominal angiogram Left 01/25/13  . Colonoscopy    .  Cardiac catheterization    . Eye surgery    . Femoral-popliteal bypass graft Left 02/20/2013    Procedure:  FEMORAL-to below knee POPLITEAL ARTERY bypasswith saphenous vein with intraoperative angiogram ;  Surgeon: Angelia Mould, MD;  Location: [place] [facility];  Service: Vascular;  Laterality: Left;  fem-below knee pop  . Amputation Left 03/27/2013    Procedure: AMPUTATION ABOVE KNEE;  Surgeon: Angelia Mould, MD;  Location: [place];  Service: Vascular;  Laterality: Left;  . Tee without cardioversion N/A 09/11/2013    Procedure: TRANSESOPHAGEAL ECHOCARDIOGRAM (TEE);  Surgeon: Casandra Doffing, MD;  Location: [facility] ENDOSCOPY;  Service: Cardiovascular;  Laterality: N/A;  . Cardioversion N/A 09/11/2013    Procedure: CARDIOVERSION;  Surgeon: Casandra Doffing,  MD;  Location: [facility] ENDOSCOPY;  Service: Cardiovascular;  Laterality: N/A;   Prior to Admission medications   Medication Sig Start Date End Date Taking? Authorizing Provider  acetaminophen (TYLENOL) 325 MG tablet Take 650 mg by mouth every 6 (six) hours as needed for pain.    Yes Historical Provider, MD  aspirin 325 MG tablet Take 325 mg by mouth daily.   Yes Historical Provider, MD  atorvastatin (LIPITOR) 10 MG tablet Take 10 mg by mouth daily before breakfast.    Yes Historical Provider, MD  B Complex-C (B-COMPLEX WITH VITAMIN C) tablet Take 1 tablet by mouth daily.   Yes Historical Provider, MD  cinacalcet (SENSIPAR) 60 MG tablet Take 60 mg by mouth daily.    Yes Historical Provider, MD  folic acid-vitamin b complex-vitamin c-selenium-zinc (DIALYVITE) 3 MG TABS tablet Take 1 tablet by mouth daily.   Yes Historical Provider, MD  HYDROcodone-acetaminophen (NORCO/VICODIN) 5-325 MG per tablet Take 1 tablet by mouth every 6 (six) hours as needed for moderate pain.   Yes Historical Provider, MD  insulin aspart (NOVOLOG) 100 UNIT/ML injection Inject 3-15 Units into the skin 3 (three) times daily before meals. Sliding scale   Yes Historical Provider, MD  midodrine (PROAMATINE) 10 MG tablet Take 10 mg by mouth every Monday, Wednesday, and Friday.    Yes Historical Provider, MD  pregabalin (LYRICA) 25 MG capsule Take 25 mg by mouth 2 (two) times daily. 05/23/13  Yes Placido Sou, MD  sevelamer carbonate (RENVELA) 800 MG tablet Take 1,600 mg by mouth 3 (three) times daily with meals.   Yes Historical Provider, MD   Allergies  Allergen Reactions  . Neurontin [Gabapentin] Anaphylaxis  . Ezetimibe-Simvastatin Other (See Comments)    REACTION: face edema; itching  . Klonopin [Clonazepam] Other (See Comments)    hallucination  . Sertraline Hcl Other (See Comments)    hallucination    FAMILY HISTORY:  Family History  Problem Relation Age of Onset  . Heart disease    . Edema Father     fluid  overload  . Cerebral aneurysm Mother   . Stroke Brother   . Heart disease Brother    SOCIAL HISTORY:  reports that she has never smoked. She has never used smokeless tobacco. She reports that she does not drink alcohol or use illicit drugs.  REVIEW OF SYSTEMS:   Negative except above.  SUBJECTIVE:   VITAL SIGNS: Temp:  [97.1 F (36.2 C)-102.8 F (39.3 C)] 99.2 F (37.3 C) (12/31 2358) Pulse Rate:  [72-120] 72 (12/31 1440) Resp:  [13-31] 14 (01/01 0100) BP: (50-104)/(18-61) 66/35 mmHg (01/01 0100) SpO2:  [90 %-100 %] 100 % (01/01 0100) Weight:  [207 lb 3.7 oz (94 kg)-217 lb 6 oz (98.6 kg)] 207 lb 3.7  oz (94 kg) (12/31 1440) 3 liters [place]  INTAKE / OUTPUT: Intake/Output     12/31 0701 - 01/01 0700   P.O. 360   I.V. (mL/kg) 130.5 (1.4)   IV Piggyback 550   Total Intake(mL/kg) 1040.5 (11.1)   Other 4500   Total Output 4500   Net -3459.5         PHYSICAL EXAMINATION: General: Appears ill Neuro:  Sleepy, wakes up easily, CN intact, follows commands, moves extremities HEENT:  Pupils reactive, no sinus tenderness, no oral exudate Cardiovascular:  Irregular, no murmur Lungs:  Scattered rhonchi, decreased breath sounds, no wheeze Abdomen:  Soft, non tender, decreased bowel sounds Musculoskeletal:  Rt BKA stump dressing clean, Lt AKA stump clean Skin:  No rashes  LABS:  CBC  Recent Labs Lab 09/10/13 0450 09/11/13 0500 09/12/13 0434  WBC 6.8 6.9 7.4  HGB 16.2* 15.3* 15.2*  HCT 48.5* 46.5* 46.0  PLT 123* 105* 91*   Coag's  Recent Labs Lab 09/09/13 0020 09/10/13 0450 09/11/13 0500 09/12/13 0434  APTT 61*  --   --   --   INR 1.33 1.42 2.05* 2.80*   BMET  Recent Labs Lab 09/09/13 0530 09/10/13 0450 09/12/13 1026  NA 131* 128* 135*  K 4.9 5.3* 5.3  CL 90* 87* 95*  CO2 27 25 23   BUN 41* 47* 45*  CREATININE 4.90* 5.79* 5.92*  GLUCOSE 194* 142* 145*   Electrolytes  Recent Labs Lab 09/09/13 0530 09/10/13 0450 09/12/13 1026  CALCIUM 9.2 9.0 8.2*    PHOS  --  6.3* 6.9*   Sepsis Markers No results found for this basename: LATICACIDVEN, PROCALCITON, O2SATVEN,  in the last 168 hours ABG No results found for this basename: PHART, PCO2ART, PO2ART,  in the last 168 hours Liver Enzymes  Recent Labs Lab 09/10/13 0450 09/12/13 1026  ALBUMIN 2.0* 1.9*   Cardiac Enzymes  Recent Labs Lab 09/08/13 1546 09/08/13 2231 09/09/13 0235 09/09/13 1420  TROPONINI <0.30 <0.30 <0.30 <0.30  PROBNP 32871.0*  --   --   --    Glucose  Recent Labs Lab 09/12/13 0736 09/12/13 1520 09/12/13 1706 09/12/13 2120 09/12/13 2203 09/12/13 2356  GLUCAP 98 76 91 50* 82 71    Imaging Dg Chest Port 1 View  09/12/2013   CLINICAL DATA:  Diabetes. Ischemic cardiomyopathy. Coronary artery disease. Fever.  EXAM: PORTABLE CHEST - 1 VIEW  COMPARISON:  09/08/2013  FINDINGS: Prior median sternotomy. Mildly degraded exam due to AP portable technique and patient body habitus. Midline trachea. Cardiomegaly accentuated by AP portable technique. Mild right hemidiaphragm elevation. No right and no definite left pleural effusion. Patchy left greater than right bibasilar airspace disease.  IMPRESSION: Cardiomegaly and low lung volumes.  Patchy bibasilar opacities. Favor atelectasis. Difficult to exclude early infection or aspiration. If PA and lateral radiographs are possible, they should be considered.   Electronically Signed   By: Abigail Miyamoto M.D.   On: 09/12/2013 21:36    ASSESSMENT / PLAN:  PULMONARY A: Hypoxia in setting of ATX and probable pneumonia. Hx of OSA. P:   -f/u CXR -oxygen to keep SpO2 > 90% -CPAP qhs -check ABG  CARDIOVASCULAR A:  Hypotension >> ?from volume removal with HD versus sepsis.  Normally runs low blood pressures. New onset A fib with RVR. Hx of ischemic cardiomyopathy, CAD, PVD. P:  -repeat fluid bolus >> if no improvement in BP, then place CVL and add pressor agent -monitor hemodynamics, heart rhythm -amiodarone per  cardiology -may need  to hold coumadin and use heparin gtt until she is more stable -continue midodrine  RENAL A:   ESRD. P:   -HD per renal  GASTROINTESTINAL A:   Nutrition. P:   -renal diet  HEMATOLOGIC A:   Anemia of chronic disease. Chronic thrombocytopenia. P:  -f/u CBC  INFECTIOUS A:   New fever 12/30 with progressive infiltrates on CXR >> concern for HCAP. P:   -continue vancomycin, zosyn  ENDOCRINE A:   DM type II.   P:   -SSI  NEUROLOGIC A:   Acute encephalopathy 2nd to hypotension. P:   -monitor mental status  RHEUMATOLOGY A: Hx of SLE >> does not appear to be active issue. P: -monitor clincially  Goals of Care >> full code.  Discussed with Dr. Marin Comment.  Will transfer pt to ICU.  PCCM will assume care while pt in ICU.  CC time 50 minutes.  Chesley Mires, MD [facility] Pulmonary/Critical Care 09/13/2013, 2:10 AM Pager:  [phone] After 3pm call: [phone]

## 2013-09-13 NOTE — Progress Notes (Signed)
Subjective:  Lethargic.  No c/o SOB. Hypotensive overnight and now on neo.  Objective:  Vital Signs in the last 24 hours: BP 63/45  Pulse 88  Temp(Src) 99.2 F (37.3 C) (Oral)  Resp 16  Ht 5\' 4"  (1.626 m)  Wt 94 kg (207 lb 3.7 oz)  BMI 35.55 kg/m2  SpO2 97%  Physical Exam: Obese BF mildly lethargic Lungs:  Reduced breath sounds Cardiac:  irregular rhythm, normal S1 and S2, no S3 Abdomen:  Soft, nontender, no masses Extremities:  BKA noted  Intake/Output from previous day: 12/31 0701 - 01/01 0700 In: 1040.5 [P.O.:360; I.V.:130.5; IV Piggyback:550] Out: 4500  Weight Filed Weights   09/12/13 0452 09/12/13 1030 09/12/13 1440  Weight: 95 kg (209 lb 7 oz) 98.6 kg (217 lb 6 oz) 94 kg (207 lb 3.7 oz)    Lab Results: Basic Metabolic Panel:  Recent Labs  09/12/13 1026  NA 135*  K 5.3  CL 95*  CO2 23  GLUCOSE 145*  BUN 45*  CREATININE 5.92*    CBC:  Recent Labs  09/12/13 0434 09/13/13 0425  WBC 7.4 8.3  HGB 15.2* 14.0  HCT 46.0 43.1  MCV 83.8 84.8  PLT 91* 77*    BNP    Component Value Date/Time   PROBNP 32871.0* 09/08/2013 1546    PROTIME: Lab Results  Component Value Date   INR 3.12* 09/13/2013   INR 2.80* 09/12/2013   INR 2.05* 09/11/2013    Telemetry: Difficult to tell ifatrial fib/flutter or sinus with PAC's Rate controlled  Assessment/Plan:  1. Difficult to tell rhythm on current telemetry. Frequent premature beats versus atrial fib 2. Hypotension overnight 3. ESRD 4. PVD  REC:  Amiodarone was started yesterday pm for recurrent afib.  Rate control agents witll be difficult to sue with BP.    Kerry Hough  MD [facility] Cardiology  09/13/2013, 8:35 AM

## 2013-09-13 NOTE — Progress Notes (Signed)
Pt moved to room 2H14, Suhayla Chisom (sitter in law) notified of change in condition.

## 2013-09-13 NOTE — Progress Notes (Addendum)
[facility] Progress Note    Subjective: had temp to 103 last night with assoc hypotension, pressors started low dose. 4.5 kg off with HD yesterday pt tolerated well.  Today she is sleepy but easily arouses, no complaints   Exam  Blood pressure 63/45, pulse 88, temperature 99.2 F (37.3 C), temperature source Oral, resp. rate 16, height 5\' 4"  (1.626 m), weight 94 kg (207 lb 3.7 oz), SpO2 97.00%. 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 New Atrial fib with RVR= s/p cardioversion, NSR, on heparin 2. Fever / 103F: started on empiric abx, possible PNA 3. Hypotension / acute on chronic: on low dose pressors now 4. AMS: improved 5. CM / EF 35-40% 6. ESRD: still 3-4kg up with abnl cxr, would like to dialyze today but not sure if BP will tolerate; will reassess in a few hours 7. Chronic hypOtension/volume: inc'd midodrine, as above 8. Anemia - No ESA with hgb >12/ no iron 9. Metabolic bone disease - Hold Hectorol with mild Hypercalcemia corrected to 11.0 phos 6.3/ Renvela binder with meals 10. Nutrition: renal carb mod / renal vit 11. R BKA wound: does not look infected    Kelly Splinter MD  pager [phone]    cell [phone]  09/13/2013, 10:42 AM   Recent Labs Lab 09/09/13 0530 09/10/13 0450 09/12/13 1026  NA 131* 128* 135*  K 4.9 5.3* 5.3  CL 90* 87* 95*  CO2 27 25 23   GLUCOSE 194* 142* 145*  BUN 41* 47* 45*  CREATININE 4.90*  5.79* 5.92*  CALCIUM 9.2 9.0 8.2*  PHOS  --  6.3* 6.9*    Recent Labs Lab 09/10/13 0450 09/12/13 1026  ALBUMIN 2.0* 1.9*    Recent Labs Lab 09/11/13 0500 09/12/13 0434 09/13/13 0425  WBC 6.9 7.4 8.3  HGB 15.3* 15.2* 14.0  HCT 46.5* 46.0 43.1  MCV 84.2 83.8 84.8  PLT 105* 91* 77*   . amiodarone  200 mg Oral TID  . aspirin EC  81 mg Oral Daily  . atorvastatin  10 mg Oral QAC breakfast  . B-complex with vitamin C  1 tablet Oral Daily  . cinacalcet  60 mg Oral Q breakfast  . feeding supplement (RESOURCE BREEZE)  1 Container Oral TID BM  . insulin aspart  0-15 Units Subcutaneous TID WC  . insulin aspart  3 Units Subcutaneous TID WC  . midodrine  10 mg Oral BID  . multivitamin  1 tablet Oral QHS  . piperacillin-tazobactam (ZOSYN)  IV  2.25 g Intravenous Q8H  . sevelamer carbonate  1,600 mg Oral TID WC  . silver sulfADIAZINE   Topical BID  . sodium chloride  500 mL Intravenous Once  . [START ON 09/14/2013] vancomycin  1,000 mg Intravenous Q M,W,F-HD  . warfarin   Does not apply Once  .  Warfarin - Pharmacist Dosing Inpatient   Does not apply q1800   . sodium chloride 10 mL/hr (09/11/13 0502)  . norepinephrine (LEVOPHED) Adult infusion 2 mcg/min (09/13/13 0434)

## 2013-09-13 NOTE — Procedures (Signed)
Arterial Catheter Insertion Procedure Note KARNISHA LEFEBRE [id number] 1955-10-08  Procedure: Insertion of Arterial Catheter  Indications: Blood pressure monitoring  Procedure Details Consent: Unable to obtain consent because of emergent medical necessity. Time Out: Verified patient identification, verified procedure, site/side was marked, verified correct patient position, special equipment/implants available, medications/allergies/relevent history reviewed, required imaging and test results available.  Performed  Maximum sterile technique was used including antiseptics, cap, gloves, gown, hand hygiene, mask and sheet. Skin prep: Chlorhexidine; local anesthetic administered 20 gauge catheter was inserted into left radial artery using the Seldinger technique.  Evaluation Blood flow good; BP tracing good. Complications: No apparent complications.   Theo Dills 09/13/2013

## 2013-09-13 NOTE — Progress Notes (Signed)
Hemodialysis-3.3L UF with HD today. Pt clotted system x1, able to return all blood. Just shy of 3.5L goal d/t this. Report given to [facility] on [facility]

## 2013-09-13 NOTE — Progress Notes (Addendum)
Dr. Aundra Dubin notified of patients condition per Dr. Marin Comment request. Dr. Aundra Dubin recommended neo for pressure support and Amio IV for Afib. Dr. Marin Comment notified of recommendations. At this time, pt has returned to [place] with rate low 100's. SBP remains 60-70's. 2nd bolus infusing at this time. Will continue to monitor and inform MD of changes.

## 2013-09-13 NOTE — Progress Notes (Signed)
[facility] PCCM  Seen and examined  Now on 39mcg/min levophed Still lethargic, giving one word answers No respiratory distress  CXR with bilateral infiltrates  Impression: 1) Pneumonia, most likley HCAP, consider influenza 2) Pulm edema? 3) ESRD 4) Encephalopathy  Plan: 1) agree with dialysis, volume removal) 2) will titrate up levophed as tolerated MAP 65 3) check rapid flu test, add droplet precautions for now  Additional CC time by me 40 minutes.  Jillyn Hidden PCCM Pager: [phone] Cell: [phone] If no response, call [phone]

## 2013-09-13 NOTE — Progress Notes (Signed)
Pt refuses CPAP qHS. 

## 2013-09-13 NOTE — Progress Notes (Signed)
2nd bolus complete. BP remains in 60's. Dr. Marin Comment notified. Pt confused, speech mumbled.

## 2013-09-13 NOTE — Progress Notes (Signed)
ANTICOAGULATION CONSULT NOTE - Follow Up Consult  Pharmacy Consult for coumadin Indication: atrial fibrillation  Allergies  Allergen Reactions  . Neurontin [Gabapentin] Anaphylaxis  . Ezetimibe-Simvastatin Other (See Comments)    REACTION: face edema; itching  . Klonopin [Clonazepam] Other (See Comments)    hallucination  . Sertraline Hcl Other (See Comments)    hallucination    Patient Measurements: Height: 5\' 4"  (162.6 cm) Weight: 207 lb 3.7 oz (94 kg) IBW/kg (Calculated) : 54.7  Vital Signs: Temp: 99.4 F (37.4 C) (01/01 1300) Temp src: Oral (01/01 1200) BP: 91/59 mmHg (01/01 1300) Pulse Rate: 103 (01/01 1300)  Labs:  Recent Labs  09/11/13 0500 09/12/13 0434 09/12/13 1026 09/13/13 0425  HGB 15.3* 15.2*  --  14.0  HCT 46.5* 46.0  --  43.1  PLT 105* 91*  --  77*  LABPROT 22.5* 28.5*  --  31.0*  INR 2.05* 2.80*  --  3.12*  HEPARINUNFRC 0.47 0.64  --   --   CREATININE  --   --  5.92*  --     Estimated Creatinine Clearance: 11.5 ml/min (by C-G formula based on Cr of 5.92).   Medications:  Heparin 1450 units/hr  Assessment: 58 y/o F on coumadin for afib (started on 12/28; baseline INR= 1.33). Patient noted s/p TEE guided cardioversion to NSR on 09/11/13. Coumadin was not charted 12/29 (not clear if given) and not given 12/30 as pt was drowsy.  Only one dose of coumadin has been charted as given (12/28) and INR is 2.12 with trend up. In the past LFTS have been WNL, recent fever (temp= 102 on 12/31) could contribute to coumadin sensitivity. Of note po amiodarone was added on 09/12/13. Patient now on antibiotics with possible PNA/possible sepsis.  Goal of Therapy:  Heparin level 0.3-0.7 units/ml Monitor platelets by anticoagulation protocol: Yes   Plan:  -No coumadin today  -Daily PT/INR  Hildred Laser, Pharm D 09/13/2013 1:48 PM

## 2013-09-13 NOTE — Procedures (Signed)
Central Venous Catheter Insertion Procedure Note BREEAN NANNINI [id number] 12-02-1955  Procedure: Insertion of Central Venous Catheter Indications: Assessment of intravascular volume  Procedure Details Consent: Risks of procedure as well as the alternatives and risks of each were explained to the (patient/caregiver).  Consent for procedure obtained. Time Out: Verified patient identification, verified procedure, site/side was marked, verified correct patient position, special equipment/implants available, medications/allergies/relevent history reviewed, required imaging and test results available.  Performed  Maximum sterile technique was used including antiseptics, cap, gloves, gown, hand hygiene, mask and sheet. Skin prep: Chlorhexidine; local anesthetic administered A antimicrobial bonded/coated triple lumen catheter was placed in the left internal jugular vein using the Seldinger technique. Performed with ultrasound guidance.  Evaluation Blood flow good Complications: No apparent complications Patient did tolerate procedure well. Chest X-ray ordered to verify placement.  CXR: pending.  Chesley Mires, MD [facility] Pulmonary/Critical Care 09/13/2013, 4:16 AM Pager:  [phone] After 3pm call: [phone]

## 2013-09-14 DIAGNOSIS — Z992 Dependence on renal dialysis: Secondary | ICD-10-CM

## 2013-09-14 DIAGNOSIS — J189 Pneumonia, unspecified organism: Secondary | ICD-10-CM

## 2013-09-14 DIAGNOSIS — G9340 Encephalopathy, unspecified: Secondary | ICD-10-CM

## 2013-09-14 LAB — GLUCOSE, CAPILLARY
GLUCOSE-CAPILLARY: 72 mg/dL (ref 70–99)
GLUCOSE-CAPILLARY: 57 mg/dL — AB (ref 70–99)
GLUCOSE-CAPILLARY: 71 mg/dL (ref 70–99)
Glucose-Capillary: 146 mg/dL — ABNORMAL HIGH (ref 70–99)
Glucose-Capillary: 130 mg/dL — ABNORMAL HIGH (ref 70–99)
Glucose-Capillary: 54 mg/dL — ABNORMAL LOW (ref 70–99)
Glucose-Capillary: 55 mg/dL — ABNORMAL LOW (ref 70–99)

## 2013-09-14 LAB — CBC
HEMATOCRIT: 46 % (ref 36.0–46.0)
Hemoglobin: 15.3 g/dL — ABNORMAL HIGH (ref 12.0–15.0)
MCH: 27.8 pg (ref 26.0–34.0)
MCHC: 33.3 g/dL (ref 30.0–36.0)
MCV: 83.5 fL (ref 78.0–100.0)
Platelets: 79 10*3/uL — ABNORMAL LOW (ref 150–400)
RBC: 5.51 MIL/uL — ABNORMAL HIGH (ref 3.87–5.11)
RDW: 16.6 % — ABNORMAL HIGH (ref 11.5–15.5)
WBC: 10.8 10*3/uL — ABNORMAL HIGH (ref 4.0–10.5)

## 2013-09-14 LAB — PROTIME-INR
INR: 2.47 — ABNORMAL HIGH (ref 0.00–1.49)
Prothrombin Time: 25.9 seconds — ABNORMAL HIGH (ref 11.6–15.2)

## 2013-09-14 LAB — AMMONIA: AMMONIA: 43 umol/L (ref 11–60)

## 2013-09-14 LAB — LACTIC ACID, PLASMA: Lactic Acid, Venous: 0.9 mmol/L (ref 0.5–2.2)

## 2013-09-14 MED ORDER — AMIODARONE HCL 200 MG PO TABS
400.0000 mg | ORAL_TABLET | Freq: Two times a day (BID) | ORAL | Status: DC
  Administered 2013-09-14 – 2013-09-18 (×9): 400 mg via ORAL
  Filled 2013-09-14 (×10): qty 2

## 2013-09-14 MED ORDER — WARFARIN SODIUM 1 MG PO TABS
1.0000 mg | ORAL_TABLET | Freq: Once | ORAL | Status: AC
  Administered 2013-09-14: 1 mg via ORAL
  Filled 2013-09-14: qty 1

## 2013-09-14 MED ORDER — VANCOMYCIN HCL IN DEXTROSE 750-5 MG/150ML-% IV SOLN
750.0000 mg | Freq: Once | INTRAVENOUS | Status: AC
  Administered 2013-09-14: 750 mg via INTRAVENOUS
  Filled 2013-09-14 (×2): qty 150

## 2013-09-14 NOTE — Progress Notes (Signed)
    Subjective:   Spoke to Dr. Jonnie Finner.  Patient was quite fluid overloaded.  10 L removed at dialysis. BP still borderline at times.     Objective:  Filed Vitals:   09/14/13 0500 09/14/13 0600 09/14/13 0700 09/14/13 0900  BP: 114/68 95/58  114/60  Pulse:  107    Temp:   98.5 F (36.9 C)   TempSrc:   Oral   Resp: 21 16  15   Height:      Weight:      SpO2:  88% 95%     Intake/Output from previous day:  Intake/Output Summary (Last 24 hours) at 09/14/13 1004 Last data filed at 09/14/13 0900  Gross per 24 hour  Intake 1026.88 ml  Output   3300 ml  Net -2273.12 ml    Physical Exam: Physical exam: Somnolent Skin is warm and dry.  HEENT is normal.  Neck is supple.  Chest with mild basilar crackles.  tachycardic  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; 1+ edema on right; dressing in place     Lab Results: Basic Metabolic Panel:  Recent Labs  09/12/13 1026 09/13/13 1439  NA 135* 136*  K 5.3 3.8  CL 95* 97  CO2 23 26  GLUCOSE 145* 157*  BUN 45* 26*  CREATININE 5.92* 4.03*  CALCIUM 8.2* 7.4*  PHOS 6.9* 4.5   CBC:  Recent Labs  09/13/13 0425 09/14/13 0340  WBC 8.3 10.8*  HGB 14.0 15.3*  HCT 43.1 46.0  MCV 84.8 83.5  PLT 77* 79*   Cardiac Enzymes: No results found for this basename: CKTOTAL, CKMB, CKMBINDEX, TROPONINI,  in the last 72 hours   Assessment/Plan:  1 atrial fibrillation-sinus tachycardia with prolonged PR after TEE guided cardioversion yesterday. Started amiodarone as her blood pressure is borderline and we have little room to increase AV nodal blocking agents.  Coumadin therapeutic.  Stopped IV heparin. Eventually, try to get Amio dose to 200 mg daily. 2  chronic end-stage renal disease-management per nephrology.  Continue fluid removal as tolerated. 3 peripheral vascular disease status post right BKA-wound care should follow. 4 ischemic cardiomyopathy-continue statin. Add low-dose beta blocker later if BP tolerates. Medications will be  limited by blood pressure.  Biventricular heart failure. 5 hyperlipidemia-continue statin.  VARANASI,JAYADEEP S. 09/14/2013, 10:04 AM

## 2013-09-14 NOTE — Consult Note (Signed)
PULMONARY / CRITICAL CARE MEDICINE  Name: [name] MRN: [id number] DOB: 02/15/1956    ADMISSION DATE:  09/08/2013 CONSULTATION DATE:  09/13/2013  REFERRING MD :  TRH  CHIEF COMPLAINT:  Low blood pressure  BRIEF PATIENT DESCRIPTION:  58 yo female admitted with Rt sided chest pain and found to have A fib with RVR.  She developed new fever 12/30, and hypotension with decreased mental status after hemodialysis 12/31.  Transferred to ICU 1/01 and PCCM assumed care.  SIGNIFICANT EVENTS: 12/27 Admit, cardiology consulted 12/29 Renal consulted 12/30 Cardioversion, fever 12/31 Hypotension after HD 01/01 Transfer to ICU, PCCM assume care  STUDIES:  12/28 Echo >> EF 30 to 35%, mild MR, mild LA dilation, severe RV systolic dysfx  LINES / TUBES: R upper arm AV graftL  IJ TLC  1/1 >>>  CULTURES: 12/27 Blood >> 12/30 Blood >>  12/31 Blood >>  ANTIBIOTICS: Vancomycin 12/31 >>> Zosyn 12/31 >>>  INTERVAL HISTORY: Off pressors as off this am.  VITAL SIGNS: Temp:  [98.1 F (36.7 C)-99.4 F (37.4 C)] 98.5 F (36.9 C) (01/02 0700) Pulse Rate:  [36-120] 99 (01/02 1000) Resp:  [11-25] 14 (01/02 1000) BP: (85-125)/(45-73) 93/52 mmHg (01/02 1000) SpO2:  [88 %-99 %] 93 % (01/02 1000) Arterial Line BP: (104-140)/(46-79) 122/55 mmHg (01/02 0100) Weight:  [84.3 kg (185 lb 13.6 oz)-86.4 kg (190 lb 7.6 oz)] 84.3 kg (185 lb 13.6 oz) (01/01 1800) 3 liters [place]  INTAKE / OUTPUT: Intake/Output     01/01 0701 - 01/02 0700 01/02 0701 - 01/03 0700   P.O.  240   I.V. (mL/kg) 723.9 (8.6) 60 (0.7)   IV Piggyback 100    Total Intake(mL/kg) 823.9 (9.8) 300 (3.6)   Other 3300    Total Output 3300     Net -2476.1 +300        Stool Occurrence 3 x 1 x     PHYSICAL EXAMINATION: General: No distress Neuro:  Awake, alert, oriented HEENT:  PERRRL Cardiovascular:  Irregular, no murmur Lungs:  Bilateral diminished air entry Abdomen:  Soft, non tender, decreased bowel sounds Musculoskeletal:  R  BKA stump dressing clean, L AKA stump clean Skin:  No rashes  LABS:  CBC  Recent Labs Lab 09/12/13 0434 09/13/13 0425 09/14/13 0340  WBC 7.4 8.3 10.8*  HGB 15.2* 14.0 15.3*  HCT 46.0 43.1 46.0  PLT 91* 77* 79*   Coag's  Recent Labs Lab 09/09/13 0020  09/12/13 0434 09/13/13 0425 09/14/13 0340  APTT 61*  --   --   --   --   INR 1.33  < > 2.80* 3.12* 2.47*  < > = values in this interval not displayed. BMET  Recent Labs Lab 09/10/13 0450 09/12/13 1026 09/13/13 1439  NA 128* 135* 136*  K 5.3* 5.3 3.8  CL 87* 95* 97  CO2 25 23 26   BUN 47* 45* 26*  CREATININE 5.79* 5.92* 4.03*  GLUCOSE 142* 145* 157*   Electrolytes  Recent Labs Lab 09/10/13 0450 09/12/13 1026 09/13/13 1439  CALCIUM 9.0 8.2* 7.4*  PHOS 6.3* 6.9* 4.5   Sepsis Markers  Recent Labs Lab 09/13/13 0150 09/13/13 1139 09/14/13 0345  LATICACIDVEN 1.9 1.2 0.9   ABG No results found for this basename: PHART, PCO2ART, PO2ART,  in the last 168 hours Liver Enzymes  Recent Labs Lab 09/10/13 0450 09/12/13 1026 09/13/13 1439  ALBUMIN 2.0* 1.9* 1.7*   Cardiac Enzymes  Recent Labs Lab 09/08/13 1546 09/08/13 2231 09/09/13 0235 09/09/13 1420  TROPONINI <0.30 <0.30 <0.30 <0.30  PROBNP 32871.0*  --   --   --    Glucose  Recent Labs Lab 09/13/13 0803 09/13/13 0848 09/13/13 1137 09/13/13 1655 09/14/13 0637 09/14/13 0733  GLUCAP 60* 86 151* 129* 146* 130*   CXR:  None today  ASSESSMENT / PLAN:  PULMONARY A: Hypoxia in setting of aspiration and probable pneumonia. Hx of OSA. P:   Goal SpO2>92 Supplemental oxygen PRN Home CPAP  CARDIOVASCULAR A:  Hypotension >> ?from volume removal with HD versus sepsis.  Normally runs low blood pressures. New onset AF-RVR. Hx of ischemic cardiomyopathy, CAD, PVD. P:  Cardiology following Goal SBP>80 ASA, Lipitor, Amiodarone Midodrine D/c Levophed  RENAL A:   ESRD. P:   Renal following HD Trend BMP  GASTROINTESTINAL A:    Nutrition. GI Px is not indicated. P:   Renal diet D/c Protonix  HEMATOLOGIC A:   Anemia of chronic disease. Chronic thrombocytopenia. Therapeutic anticoagulation for AF. P:  Trend CBC Coumadin per pharmacy  INFECTIOUS A:   Suspected HCAP. P:   Abx / cx as above  ENDOCRINE A:   DM type II.   P:   SSI  NEUROLOGIC A:   Acute encephalopathy resolved. P:   No intervention required  RHEUMATOLOGY A: Hx of SLE >> does not appear to be active issue. P: Monitor clinically  May transfer to SDU in the afternoon if remains off vasopressors.  I have personally obtained history, examined patient, evaluated and interpreted laboratory and imaging results, reviewed medical records, formulated assessment / plan and placed orders.   Doree Fudge, MD Pulmonary and [place] Pager: [phone]  09/14/2013, 11:01 AM

## 2013-09-14 NOTE — Progress Notes (Addendum)
[facility] Progress Note    Subjective: tolerated another 3.3 kg off w HD yest, is at dry wt today, no SOB. cxr yest showed improving edema, it was done during HD midway through.  She is having sweats this morning, no chills. She is oriented, tired, mild cough, no CP.    Exam  Blood pressure 95/58, pulse 107, temperature 98.5 F (36.9 C), temperature source Oral, resp. rate 16, height 5\' 4"  (1.626 m), weight 84.3 kg (185 lb 13.6 oz), SpO2 95.00%. Lethargic but improved, no myoclonus, no distress, bedclothes soaked w perspiration RRR no M or gallop Faint basilar rales R side, L clear Abd bs + , soft, nontender  R BKA stump ulcer w clean margins, no odor, mild yellow slough; L AKA intact Neuro nonfocal, ox3 R arm AVF patent  Dialysis: [facility] MWF 4h   85kg   2K/2.25Ca Bath   LUA AVF   BFR 400  Heparin 2500   Hectorol 2  Epogen 0 Venofer 0  Labs= Last hgb 14.4 op 08/29/13 and tfs 34% 08/22/13 lasrt pth 145 08/22/13   Assessment/Plan  1. New atrial fib with RVR: off heparin, s/p cardioversion in NSR, INR >2 on coumadin 2. Fever / PNA: on abx, improving 3. Hypotension / acute on chronic: cont midodrine, avoid BB and CCB for now if possible, wean pressors for SBP over 80 4. AMS: improving, check NH3 5. CM / EF 35-40% 6. ESRD: volume excess resolved, at dry wt today. Plan HD sat then resume MWF 7. Anemia - No ESA with hgb >12/ no iron 8. Metabolic bone disease - Hold Hectorol with mild Hypercalcemia corrected to 11.0 phos 6.3/ Renvela binder with meals 9. Nutrition: renal carb mod / renal vit 10. R BKA ulcer    Kelly Splinter MD  pager [phone]    cell [phone]  09/14/2013, 9:09 AM   Recent Labs Lab 09/10/13 0450 09/12/13 1026 09/13/13 1439  NA 128* 135* 136*  K 5.3* 5.3 3.8  CL 87* 95* 97  CO2 25 23 26   GLUCOSE 142* 145* 157*  BUN 47* 45* 26*  CREATININE 5.79* 5.92* 4.03*  CALCIUM 9.0 8.2* 7.4*  PHOS 6.3* 6.9* 4.5    Recent Labs Lab 09/10/13 0450  09/12/13 1026 09/13/13 1439  ALBUMIN 2.0* 1.9* 1.7*    Recent Labs Lab 09/12/13 0434 09/13/13 0425 09/14/13 0340  WBC 7.4 8.3 10.8*  HGB 15.2* 14.0 15.3*  HCT 46.0 43.1 46.0  MCV 83.8 84.8 83.5  PLT 91* 77* 79*   . amiodarone  200 mg Oral TID  . aspirin EC  81 mg Oral Daily  . atorvastatin  10 mg Oral QAC breakfast  . B-complex with vitamin C  1 tablet Oral Daily  . cinacalcet  60 mg Oral Q breakfast  . feeding supplement (RESOURCE BREEZE)  1 Container Oral TID BM  . insulin aspart  0-15 Units Subcutaneous TID WC  . insulin aspart  3 Units Subcutaneous TID WC  . midodrine  10 mg Oral BID  . multivitamin  1 tablet Oral QHS  . piperacillin-tazobactam (ZOSYN)  IV  2.25 g Intravenous Q8H  . sevelamer carbonate  1,600 mg Oral TID WC  . silver sulfADIAZINE   Topical BID  . sodium chloride  500 mL Intravenous Once  . vancomycin  1,000 mg Intravenous Q M,W,F-HD  . warfarin   Does not apply Once  . Warfarin - Pharmacist Dosing Inpatient   Does not apply q1800   . sodium chloride  20 mL/hr (09/13/13 1800)  . norepinephrine (LEVOPHED) Adult infusion 8 mcg/min (09/13/13 1915)

## 2013-09-14 NOTE — Progress Notes (Signed)
ANTIBIOTIC CONSULT NOTE - Follow Up  Pharmacy Consult for vancomycin Indication: sepsis  Allergies  Allergen Reactions  . Neurontin [Gabapentin] Anaphylaxis  . Ezetimibe-Simvastatin Other (See Comments)    REACTION: face edema; itching  . Klonopin [Clonazepam] Other (See Comments)    hallucination  . Sertraline Hcl Other (See Comments)    hallucination   Patient Measurements: Height: 5\' 4"  (162.6 cm) Weight: 185 lb 13.6 oz (84.3 kg) (bedscale) IBW/kg (Calculated) : 54.7  Vital Signs: Temp: 98.5 F (36.9 C) (01/02 0700) Temp src: Oral (01/02 0700) BP: 95/58 mmHg (01/02 0600) Pulse Rate: 107 (01/02 0600) Intake/Output from previous day: 01/01 0701 - 01/02 0700 In: 823.9 [I.V.:723.9; IV Piggyback:100] Out: 3300   Labs:  Recent Labs  09/12/13 0434 09/12/13 1026 09/13/13 0425 09/13/13 1439 09/14/13 0340  WBC 7.4  --  8.3  --  10.8*  HGB 15.2*  --  14.0  --  15.3*  PLT 91*  --  77*  --  79*  CREATININE  --  5.92*  --  4.03*  --    Estimated Creatinine Clearance: 16 ml/min (by C-G formula based on Cr of 4.03). No results found for this basename: VANCOTROUGH, Corlis Leak, VANCORANDOM, Frederic, GENTPEAK, GENTRANDOM, TOBRATROUGH, TOBRAPEAK, TOBRARND, AMIKACINPEAK, AMIKACINTROU, AMIKACIN,  in the last 72 hours   Microbiology: Recent Results (from the past 720 hour(s))  MRSA PCR SCREENING     Status: None   Collection Time    09/08/13  9:30 PM      Result Value Range Status   MRSA by PCR NEGATIVE  NEGATIVE Final   Comment:            The GeneXpert MRSA Assay (FDA     approved for NASAL specimens     only), is one component of a     comprehensive MRSA colonization     surveillance program. It is not     intended to diagnose MRSA     infection nor to guide or     monitor treatment for     MRSA infections.  CULTURE, BLOOD (ROUTINE X 2)     Status: None   Collection Time    09/08/13 11:53 PM      Result Value Range Status   Specimen Description BLOOD LEFT ARM    Final   Special Requests BOTTLES DRAWN AEROBIC ONLY 10CC   Final   Culture  Setup Time     Final   Value: 09/09/2013 14:18     Performed at [facility]   Culture     Final   Value:        BLOOD CULTURE RECEIVED NO GROWTH TO DATE CULTURE WILL BE HELD FOR 5 DAYS BEFORE ISSUING A FINAL NEGATIVE REPORT     Performed at [facility]   Report Status PENDING   Incomplete  CULTURE, BLOOD (ROUTINE X 2)     Status: None   Collection Time    09/08/13 11:59 PM      Result Value Range Status   Specimen Description BLOOD LEFT HAND   Final   Special Requests BOTTLES DRAWN AEROBIC ONLY 8CC   Final   Culture  Setup Time     Final   Value: 09/09/2013 14:18     Performed at [facility]   Culture     Final   Value:        BLOOD CULTURE RECEIVED NO GROWTH TO DATE CULTURE WILL BE HELD FOR 5 DAYS BEFORE ISSUING  A FINAL NEGATIVE REPORT     Performed at [facility]   Report Status PENDING   Incomplete  CULTURE, BLOOD (ROUTINE X 2)     Status: None   Collection Time    09/11/13  5:30 PM      Result Value Range Status   Specimen Description BLOOD LEFT HAND   Final   Special Requests     Final   Value: BOTTLES DRAWN AEROBIC AND ANAEROBIC 10CC AER,5CC ANA   Culture  Setup Time     Final   Value: 09/11/2013 23:38     Performed at [facility]   Culture     Final   Value:        BLOOD CULTURE RECEIVED NO GROWTH TO DATE CULTURE WILL BE HELD FOR 5 DAYS BEFORE ISSUING A FINAL NEGATIVE REPORT     Performed at [facility]   Report Status PENDING   Incomplete  CULTURE, BLOOD (ROUTINE X 2)     Status: None   Collection Time    09/11/13  5:40 PM      Result Value Range Status   Specimen Description BLOOD LEFT FOREARM   Final   Special Requests BOTTLES DRAWN AEROBIC ONLY [place]   Final   Culture  Setup Time     Final   Value: 09/11/2013 23:39     Performed at [facility]   Culture     Final   Value:        BLOOD CULTURE RECEIVED NO GROWTH TO DATE  CULTURE WILL BE HELD FOR 5 DAYS BEFORE ISSUING A FINAL NEGATIVE REPORT     Performed at [facility]   Report Status PENDING   Incomplete  CULTURE, BLOOD (ROUTINE X 2)     Status: None   Collection Time    09/12/13 10:08 PM      Result Value Range Status   Specimen Description BLOOD LEFT HAND   Final   Special Requests BOTTLES DRAWN AEROBIC AND ANAEROBIC 10CC EACH   Final   Culture  Setup Time     Final   Value: 09/13/2013 04:19     Performed at [facility]   Culture     Final   Value:        BLOOD CULTURE RECEIVED NO GROWTH TO DATE CULTURE WILL BE HELD FOR 5 DAYS BEFORE ISSUING A FINAL NEGATIVE REPORT     Performed at [facility]   Report Status PENDING   Incomplete  CULTURE, BLOOD (ROUTINE X 2)     Status: None   Collection Time    09/12/13 10:13 PM      Result Value Range Status   Specimen Description BLOOD LEFT HAND FINGER   Final   Special Requests MIDDLE BOTTLES DRAWN AEROBIC ONLY 2CC   Final   Culture  Setup Time     Final   Value: 09/13/2013 04:18     Performed at [facility]   Culture     Final   Value:        BLOOD CULTURE RECEIVED NO GROWTH TO DATE CULTURE WILL BE HELD FOR 5 DAYS BEFORE ISSUING A FINAL NEGATIVE REPORT     Performed at [facility]   Report Status PENDING   Incomplete    Medical History: Past Medical History  Diagnosis Date  . Diabetes mellitus, type 2   . Secondary hyperparathyroidism   . OSA (obstructive sleep apnea)  NPSG 11.23.11-AHI 64.9/hr-central and obstructive apnea  . Ischemic cardiomyopathy     EF 25-30% 2011  . CAD (coronary artery disease)   . Pulmonary hypertension     Est PAsyst 60-Dr Linard Millers, off coumadin, CT neg for PE  . Hyperlipidemia   . Anemia   . Thyroid disease   . Pulmonary edema   . Peripheral vascular disease   . Sebaceous cyst   . 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 Richardson Dopp  . Irregular heart beat   . Shortness of breath     with exertion  . Hemodialysis  patient     M,W,F  . TIA (transient ischemic attack) June 2014  . Ulcer of heel due to diabetes     left  . Kidney disease Dialysis MWF    ESRD secondary to DM, failed transplant 2006. Dialysis    Medications:  Scheduled:  . amiodarone  200 mg Oral TID  . aspirin EC  81 mg Oral Daily  . atorvastatin  10 mg Oral QAC breakfast  . B-complex with vitamin C  1 tablet Oral Daily  . cinacalcet  60 mg Oral Q breakfast  . feeding supplement (RESOURCE BREEZE)  1 Container Oral TID BM  . insulin aspart  0-15 Units Subcutaneous TID WC  . insulin aspart  3 Units Subcutaneous TID WC  . midodrine  10 mg Oral BID  . multivitamin  1 tablet Oral QHS  . piperacillin-tazobactam (ZOSYN)  IV  2.25 g Intravenous Q8H  . sevelamer carbonate  1,600 mg Oral TID WC  . silver sulfADIAZINE   Topical BID  . sodium chloride  500 mL Intravenous Once  . vancomycin  1,000 mg Intravenous Q M,W,F-HD  . warfarin   Does not apply Once  . Warfarin - Pharmacist Dosing Inpatient   Does not apply q1800   Infusions:  . sodium chloride 20 mL/hr (09/13/13 1800)  . norepinephrine (LEVOPHED) Adult infusion 8 mcg/min (09/13/13 1915)   Assessment: 58 yo F with ESRD and HD on MWF presented with right sided chest pain and found to have new onset afib.  Patient subsequently developed fever and pharmacy was consulted to dose vancomycin for sepsis, also on Zosyn.  Patient received a loading dose of 2g on 12/31. D/t volume overload, patient went to HD yesterday 3.5h at ~400bfr (on Thursday, off routine schedule), but did not receive any vancomycin afterwards.  Patient is afebrile with WBC trending up to 10.8.  12/31 vanc>> 12/31 zosyn>>  Goal of Therapy:  Pre-HD vancomycin level 15-25 mcg/mL  Plan:  - continue Zosyn IV 2.25gm q8h - supplemental dose this AM of 750mg  d/t non-routine 3.5h HD received yesterday - continue to plan for vancomycin IV 1g with HD on MWF - draw random vancomycin level with AM labs to assess - f/u HD  schedule and need for further supplemental doses - monitor WBC, temperature curve, cultures, and clinical progression  Ovid Curd E. Jacqlyn Larsen, PharmD Clinical Pharmacist - Resident Pager: [phone] Pharmacy: [phone] 09/14/2013 9:01 AM

## 2013-09-14 NOTE — Progress Notes (Signed)
ANTICOAGULATION CONSULT NOTE - Follow Up Consult  Pharmacy Consult for coumadin Indication: atrial fibrillation  Allergies  Allergen Reactions  . Neurontin [Gabapentin] Anaphylaxis  . Ezetimibe-Simvastatin Other (See Comments)    REACTION: face edema; itching  . Klonopin [Clonazepam] Other (See Comments)    hallucination  . Sertraline Hcl Other (See Comments)    hallucination    Patient Measurements: Height: 5\' 4"  (162.6 cm) Weight: 185 lb 13.6 oz (84.3 kg) (bedscale) IBW/kg (Calculated) : 54.7  Vital Signs: Temp: 98.5 F (36.9 C) (01/02 0700) Temp src: Oral (01/02 0700) BP: 101/57 mmHg (01/02 1330) Pulse Rate: 93 (01/02 1110)  Labs:  Recent Labs  09/12/13 0434 09/12/13 1026 09/13/13 0425 09/13/13 1439 09/14/13 0340  HGB 15.2*  --  14.0  --  15.3*  HCT 46.0  --  43.1  --  46.0  PLT 91*  --  77*  --  79*  LABPROT 28.5*  --  31.0*  --  25.9*  INR 2.80*  --  3.12*  --  2.47*  HEPARINUNFRC 0.64  --   --   --   --   CREATININE  --  5.92*  --  4.03*  --     Estimated Creatinine Clearance: 16 ml/min (by C-G formula based on Cr of 4.03).  Assessment: 58 y/o F on coumadin for afib (started on 12/28; baseline INR= 1.33). Patient is s/p TEE guided cardioversion to NSR on 12/30.  She only received one dose of coumadin since admission on 12/28.  Despite only one dose of 5mg , patient's INR trended up for the following 4 days (1.42>>2.05>>2.8>>3.12) and only began to trend downward into the therapeutic range this morning to 2.47.  Of note, amiodarone was added on 09/12/13 which can interact to potentiate the effects of Coumadin. Patient also on antibiotics for possible PNA/sepsis. D/w Dr.Z if plan to continue Coumadin or just consider heparin if INR falls <2.  We will continue with Coumadin for now, but will dose conservatively given response to initial dose and overall clinical picture.  Goal of Therapy:  INR 2-3 Monitor platelets by anticoagulation protocol: Yes   Plan:  -  give Coumadin PO 1mg  x1 dose tonight - draw CMP (with LFTs) with AM labs - daily PT/INR - daily CBC for now - monitor for s/s of bleeding  Ovid Curd E. Jacqlyn Larsen, PharmD Clinical Pharmacist - Resident Pager: [phone] Pharmacy: [phone] 09/14/2013 2:41 PM

## 2013-09-14 NOTE — Progress Notes (Signed)
[facility]  Patient Status: Active (receiving services up to time of hospitalization)  [facility] is providing the following services: RN  If patient discharges after hours, please call [phone].   Janae Sauce 09/14/2013, 10:16 AM

## 2013-09-14 NOTE — Progress Notes (Signed)
RT Note: Called to pts room about home CPAP not working properly. Placed pt on one of our machines on auto titrate with 4L O2 bleed in. Pt tolerating well at this time

## 2013-09-14 NOTE — Progress Notes (Signed)
Rt Note: Pts family brought in her home CPAP foe use tonight. It is setup at bedside. Pt states she does not need further assistance from RT. Will continue  to monitor.

## 2013-09-15 ENCOUNTER — Inpatient Hospital Stay (HOSPITAL_COMMUNITY): Payer: Medicare Other

## 2013-09-15 DIAGNOSIS — I959 Hypotension, unspecified: Secondary | ICD-10-CM

## 2013-09-15 LAB — CULTURE, BLOOD (ROUTINE X 2)
Culture: NO GROWTH
Culture: NO GROWTH

## 2013-09-15 LAB — COMPREHENSIVE METABOLIC PANEL
ALK PHOS: 114 U/L (ref 39–117)
ALT: 31 U/L (ref 0–35)
AST: 39 U/L — ABNORMAL HIGH (ref 0–37)
Albumin: 1.6 g/dL — ABNORMAL LOW (ref 3.5–5.2)
BUN: 29 mg/dL — ABNORMAL HIGH (ref 6–23)
CO2: 26 meq/L (ref 19–32)
CREATININE: 4.84 mg/dL — AB (ref 0.50–1.10)
Calcium: 7.6 mg/dL — ABNORMAL LOW (ref 8.4–10.5)
Chloride: 96 mEq/L (ref 96–112)
GFR calc Af Amer: 10 mL/min — ABNORMAL LOW (ref >90)
GFR, EST NON AFRICAN AMERICAN: 9 mL/min — AB (ref >90)
GLUCOSE: 102 mg/dL — AB (ref 70–99)
Potassium: 3.9 mEq/L (ref 3.7–5.3)
Sodium: 138 mEq/L (ref 137–147)
Total Bilirubin: 0.7 mg/dL (ref 0.3–1.2)
Total Protein: 5.9 g/dL — ABNORMAL LOW (ref 6.0–8.3)

## 2013-09-15 LAB — CBC
HCT: 44 % (ref 36.0–46.0)
Hemoglobin: 14.5 g/dL (ref 12.0–15.0)
MCH: 27.1 pg (ref 26.0–34.0)
MCHC: 33 g/dL (ref 30.0–36.0)
MCV: 82.1 fL (ref 78.0–100.0)
Platelets: 86 10*3/uL — ABNORMAL LOW (ref 150–400)
RBC: 5.36 MIL/uL — ABNORMAL HIGH (ref 3.87–5.11)
RDW: 16.7 % — AB (ref 11.5–15.5)
WBC: 8.1 10*3/uL (ref 4.0–10.5)

## 2013-09-15 LAB — VANCOMYCIN, RANDOM: Vancomycin Rm: 25.7 ug/mL

## 2013-09-15 LAB — GLUCOSE, CAPILLARY
Glucose-Capillary: 117 mg/dL — ABNORMAL HIGH (ref 70–99)
Glucose-Capillary: 97 mg/dL (ref 70–99)
Glucose-Capillary: 72 mg/dL (ref 70–99)
Glucose-Capillary: 65 mg/dL — ABNORMAL LOW (ref 70–99)

## 2013-09-15 LAB — PROTIME-INR
INR: 2.55 — ABNORMAL HIGH (ref 0.00–1.49)
Prothrombin Time: 26.6 seconds — ABNORMAL HIGH (ref 11.6–15.2)

## 2013-09-15 MED ORDER — MIDODRINE HCL 5 MG PO TABS
10.0000 mg | ORAL_TABLET | Freq: Three times a day (TID) | ORAL | Status: DC
  Administered 2013-09-15 – 2013-09-18 (×10): 10 mg via ORAL
  Filled 2013-09-15 (×12): qty 2

## 2013-09-15 MED ORDER — VANCOMYCIN HCL IN DEXTROSE 750-5 MG/150ML-% IV SOLN
750.0000 mg | Freq: Once | INTRAVENOUS | Status: AC
  Administered 2013-09-15: 750 mg via INTRAVENOUS
  Filled 2013-09-15: qty 150

## 2013-09-15 MED ORDER — WARFARIN SODIUM 1 MG PO TABS
1.0000 mg | ORAL_TABLET | Freq: Once | ORAL | Status: AC
  Administered 2013-09-15: 1 mg via ORAL
  Filled 2013-09-15: qty 1

## 2013-09-15 MED ORDER — VANCOMYCIN HCL IN DEXTROSE 750-5 MG/150ML-% IV SOLN
750.0000 mg | INTRAVENOUS | Status: DC
  Administered 2013-09-17: 750 mg via INTRAVENOUS
  Filled 2013-09-15 (×2): qty 150

## 2013-09-15 MED ORDER — OXYCODONE HCL 5 MG PO TABS
5.0000 mg | ORAL_TABLET | Freq: Four times a day (QID) | ORAL | Status: DC | PRN
  Administered 2013-09-15 – 2013-09-18 (×6): 5 mg via ORAL
  Filled 2013-09-15 (×7): qty 1

## 2013-09-15 NOTE — Consult Note (Signed)
Attending:  I have seen and examined the patient with nurse practitioner/resident and agree with the note above.   Still on levophed but looks great compared to when I last saw her on Thursday.  Push midodrine  CC time 35 minutes  Jillyn Hidden PCCM Pager: [phone] Cell: [phone] If no response, call [phone]

## 2013-09-15 NOTE — Progress Notes (Signed)
RT Note: Checked with pt about CPAP pt states she wants to try her home machine again. I told her to call if she needs any further assistance. RT will continue to monitor

## 2013-09-15 NOTE — Progress Notes (Signed)
ANTICOAGULATION CONSULT NOTE - Follow Up Consult  Pharmacy Consult for coumadin Indication: atrial fibrillation  Allergies  Allergen Reactions  . Neurontin [Gabapentin] Anaphylaxis  . Ezetimibe-Simvastatin Other (See Comments)    REACTION: face edema; itching  . Klonopin [Clonazepam] Other (See Comments)    hallucination  . Sertraline Hcl Other (See Comments)    hallucination    Patient Measurements: Height: 5\' 4"  (162.6 cm) Weight: 187 lb 13.3 oz (85.2 kg) IBW/kg (Calculated) : 54.7  Vital Signs: Temp: 97.9 F (36.6 C) (01/03 0900) Temp src: Oral (01/03 0900) BP: 106/63 mmHg (01/03 0915) Pulse Rate: 90 (01/03 0915)  Labs:  Recent Labs  09/12/13 1026  09/13/13 0425 09/13/13 1439 09/14/13 0340 09/15/13 0400  HGB  --   < > 14.0  --  15.3* 14.5  HCT  --   --  43.1  --  46.0 44.0  PLT  --   --  77*  --  79* 86*  LABPROT  --   --  31.0*  --  25.9* 26.6*  INR  --   --  3.12*  --  2.47* 2.55*  CREATININE 5.92*  --   --  4.03*  --  4.84*  < > = values in this interval not displayed.  Estimated Creatinine Clearance: 13.4 ml/min (by C-G formula based on Cr of 4.84).  Assessment: 58 y/o F on coumadin for afib (started on 12/28; baseline INR= 1.33). Patient is s/p TEE guided cardioversion to NSR on 12/30.  After receiving only one 5mg  dose of coumadin on 12/28, patient's INR trended up for the following 4 days (1.42>>2.05>>2.8>>3.12) and only began to trend downward into the therapeutic range yesterday morning to 2.47.  After a reduced dose of 1mg  last night, INR stayed therapeutic with only slight increase to 2.55. LFT's and Tbili this morning are wnl.  H/H is stable and wnl. Plt are low but stable.   Of note, amiodarone was added on 09/12/13 which can interact to potentiate the effects of Coumadin. Patient also on antibiotics for possible PNA/sepsis. D/w Dr.Z on 01/02 if plan to continue Coumadin or just consider heparin if INR falls <2.  We will continue with Coumadin for  now, but will dose conservatively given response to initial dose and overall clinical picture.   Goal of Therapy:  INR 2-3 Monitor platelets by anticoagulation protocol: Yes   Plan:  - repeat Coumadin PO 1mg  x1 dose tonight - daily PT/INR - daily CBC for now - monitor for s/s of bleeding  Ovid Curd E. Jacqlyn Larsen, PharmD Clinical Pharmacist - Resident Pager: [phone] Pharmacy: [phone] 09/15/2013 9:31 AM

## 2013-09-15 NOTE — Consult Note (Signed)
PULMONARY / CRITICAL CARE MEDICINE  Name: [name] MRN: [id number] DOB: 20-Mar-1956    ADMISSION DATE:  09/08/2013 CONSULTATION DATE:  09/13/2013  REFERRING MD :  TRH  CHIEF COMPLAINT:  Low blood pressure  BRIEF PATIENT DESCRIPTION:  58 yo female admitted with Rt sided chest pain and found to have A fib with RVR.  She developed new fever 12/30, and hypotension with decreased mental status after hemodialysis 12/31.  Transferred to ICU 1/01 and PCCM assumed care.  SIGNIFICANT EVENTS: 12/27 - Admit, cardiology consulted 12/29 - Renal consulted 12/30 - Cardioversion, fever 12/31 - Hypotension after HD 01/01 - Transfer to ICU, PCCM assume care, hypotension, decreased mental status  STUDIES:  12/28 Echo >> EF 30 to 35%, mild MR, mild LA dilation, severe RV systolic dysfx  LINES / TUBES: R upper arm AV graftL  IJ TLC  1/1 >>>  CULTURES: 12/27 Blood >> 12/30 Blood >>  12/31 Blood >>  ANTIBIOTICS: Vancomycin 12/31 >>> Zosyn 12/31 >>>  INTERVAL HISTORY:  Remains on levophed, no distress watching TV.  Denies complaints.   VITAL SIGNS: Temp:  [98.4 F (36.9 C)-98.6 F (37 C)] 98.4 F (36.9 C) (01/03 0730) Pulse Rate:  [86-99] 86 (01/03 0900) Resp:  [5-21] 18 (01/03 0800) BP: (56-117)/(23-70) 116/68 mmHg (01/03 0900) SpO2:  [84 %-100 %] 99 % (01/03 0800) Weight:  [187 lb 13.3 oz (85.2 kg)] 187 lb 13.3 oz (85.2 kg) (01/03 0730) 3 liters [place]  INTAKE / OUTPUT: Intake/Output     01/02 0701 - 01/03 0700 01/03 0701 - 01/04 0700   P.O. 840    I.V. (mL/kg) 404.5 (4.8) 5.6 (0.1)   IV Piggyback 250    Total Intake(mL/kg) 1494.5 (17.7) 5.6 (0.1)   Other     Total Output       Net +1494.5 +5.6        Stool Occurrence 3 x      PHYSICAL EXAMINATION: General: No distress Neuro:  Awake, alert, oriented HEENT:  PERRRL Cardiovascular:  Regular, no murmur Lungs:  Bilateral diminished air entry Abdomen:  Soft, non tender, decreased bowel sounds Musculoskeletal:  R BKA stump  dressing clean, L AKA stump clean Skin:  No rashes  LABS:  CBC  Recent Labs Lab 09/13/13 0425 09/14/13 0340 09/15/13 0400  WBC 8.3 10.8* 8.1  HGB 14.0 15.3* 14.5  HCT 43.1 46.0 44.0  PLT 77* 79* 86*   Coag's  Recent Labs Lab 09/09/13 0020  09/13/13 0425 09/14/13 0340 09/15/13 0400  APTT 61*  --   --   --   --   INR 1.33  < > 3.12* 2.47* 2.55*  < > = values in this interval not displayed. BMET  Recent Labs Lab 09/12/13 1026 09/13/13 1439 09/15/13 0400  NA 135* 136* 138  K 5.3 3.8 3.9  CL 95* 97 96  CO2 23 26 26   BUN 45* 26* 29*  CREATININE 5.92* 4.03* 4.84*  GLUCOSE 145* 157* 102*   Electrolytes  Recent Labs Lab 09/10/13 0450 09/12/13 1026 09/13/13 1439 09/15/13 0400  CALCIUM 9.0 8.2* 7.4* 7.6*  PHOS 6.3* 6.9* 4.5  --    Sepsis Markers  Recent Labs Lab 09/13/13 0150 09/13/13 1139 09/14/13 0345  LATICACIDVEN 1.9 1.2 0.9   ABG No results found for this basename: PHART, PCO2ART, PO2ART,  in the last 168 hours Liver Enzymes  Recent Labs Lab 09/12/13 1026 09/13/13 1439 09/15/13 0400  AST  --   --  39*  ALT  --   --  31  ALKPHOS  --   --  114  BILITOT  --   --  0.7  ALBUMIN 1.9* 1.7* 1.6*   Cardiac Enzymes  Recent Labs Lab 09/08/13 1546 09/08/13 2231 09/09/13 0235 09/09/13 1420  TROPONINI <0.30 <0.30 <0.30 <0.30  PROBNP 32871.0*  --   --   --    Glucose  Recent Labs Lab 09/14/13 0733 09/14/13 1140 09/14/13 1617 09/14/13 1618 09/14/13 1644 09/14/13 2134  GLUCAP 130* 72 54* 55* 57* 71   CXR:  None today  ASSESSMENT / PLAN:  PULMONARY A: Hypoxia in setting of aspiration and probable pneumonia. Hx of OSA. P:   Goal SpO2>92 Supplemental oxygen PRN Home CPAP Pulmonary hygiene Mobilize as able  CARDIOVASCULAR A:  Hypotension >> ?from volume removal with HD versus sepsis.  Normally runs low blood pressures.  BP's have been in 40'H/68'G systolic per patient for the last year New onset AF-RVR. Hx of ischemic  cardiomyopathy, CAD, PVD. P:  Cardiology following ASA, Lipitor, Amiodarone Increase Midodrine TID Wean Levophed to off for SBP >80 with good mental status  RENAL A:   ESRD. - Dry wt 83 kgs P:   Renal following HD Trend BMP  GASTROINTESTINAL A:   Nutrition. GI Px is not indicated. P:   Renal diet  HEMATOLOGIC A:   Anemia of chronic disease. Chronic thrombocytopenia. Therapeutic anticoagulation for AF. P:  Trend CBC Coumadin per pharmacy  INFECTIOUS A:   Suspected HCAP. P:   Abx / cx as above  ENDOCRINE A:   DM type II.   P:   SSI  NEUROLOGIC A:   Acute encephalopathy resolved. P:   No intervention required  RHEUMATOLOGY A: Hx of SLE >> does not appear to be active issue. P: Monitor clinically  Noe Gens, NP-C [facility] Pgr: [phone] or [phone]  I have personally obtained history, examined patient, evaluated and interpreted laboratory and imaging results, reviewed medical records, formulated assessment / plan and placed orders.   09/15/2013, 9:11 AM

## 2013-09-15 NOTE — Progress Notes (Signed)
Pt. CBG 65. Pt. Asymptomatic and refused to eat or drink a snack. Pt. States her "sugar normally runs low and I feel fine." Will continue to monitor and assess pt. Status.

## 2013-09-15 NOTE — Progress Notes (Signed)
ANTIBIOTIC CONSULT NOTE - Follow Up  Pharmacy Consult for vancomycin Indication: sepsis  Allergies  Allergen Reactions  . Neurontin [Gabapentin] Anaphylaxis  . Ezetimibe-Simvastatin Other (See Comments)    REACTION: face edema; itching  . Klonopin [Clonazepam] Other (See Comments)    hallucination  . Sertraline Hcl Other (See Comments)    hallucination   Patient Measurements: Height: 5\' 4"  (162.6 cm) Weight: 187 lb 13.3 oz (85.2 kg) IBW/kg (Calculated) : 54.7  Vital Signs: Temp: 97.9 F (36.6 C) (01/03 0900) Temp src: Oral (01/03 0900) BP: 106/63 mmHg (01/03 0915) Pulse Rate: 94 (01/03 0930) Intake/Output from previous day: 01/02 0701 - 01/03 0700 In: 1494.5 [P.O.:840; I.V.:404.5; IV Piggyback:250] Out: -   Labs:  Recent Labs  09/12/13 1026 09/13/13 0425 09/13/13 1439 09/14/13 0340 09/15/13 0400  WBC  --  8.3  --  10.8* 8.1  HGB  --  14.0  --  15.3* 14.5  PLT  --  77*  --  79* 86*  CREATININE 5.92*  --  4.03*  --  4.84*   Estimated Creatinine Clearance: 13.4 ml/min (by C-G formula based on Cr of 4.84).  Recent Labs  09/15/13 0400  VANCORANDOM 25.7     Microbiology: Recent Results (from the past 720 hour(s))  MRSA PCR SCREENING     Status: None   Collection Time    09/08/13  9:30 PM      Result Value Range Status   MRSA by PCR NEGATIVE  NEGATIVE Final   Comment:            The GeneXpert MRSA Assay (FDA     approved for NASAL specimens     only), is one component of a     comprehensive MRSA colonization     surveillance program. It is not     intended to diagnose MRSA     infection nor to guide or     monitor treatment for     MRSA infections.  CULTURE, BLOOD (ROUTINE X 2)     Status: None   Collection Time    09/08/13 11:53 PM      Result Value Range Status   Specimen Description BLOOD LEFT ARM   Final   Special Requests BOTTLES DRAWN AEROBIC ONLY 10CC   Final   Culture  Setup Time     Final   Value: 09/09/2013 14:18     Performed at  [facility]   Culture     Final   Value:        BLOOD CULTURE RECEIVED NO GROWTH TO DATE CULTURE WILL BE HELD FOR 5 DAYS BEFORE ISSUING A FINAL NEGATIVE REPORT     Performed at [facility]   Report Status PENDING   Incomplete  CULTURE, BLOOD (ROUTINE X 2)     Status: None   Collection Time    09/08/13 11:59 PM      Result Value Range Status   Specimen Description BLOOD LEFT HAND   Final   Special Requests BOTTLES DRAWN AEROBIC ONLY 8CC   Final   Culture  Setup Time     Final   Value: 09/09/2013 14:18     Performed at [facility]   Culture     Final   Value:        BLOOD CULTURE RECEIVED NO GROWTH TO DATE CULTURE WILL BE HELD FOR 5 DAYS BEFORE ISSUING A FINAL NEGATIVE REPORT     Performed at [facility]  Report Status PENDING   Incomplete  CULTURE, BLOOD (ROUTINE X 2)     Status: None   Collection Time    09/11/13  5:30 PM      Result Value Range Status   Specimen Description BLOOD LEFT HAND   Final   Special Requests     Final   Value: BOTTLES DRAWN AEROBIC AND ANAEROBIC 10CC AER,5CC ANA   Culture  Setup Time     Final   Value: 09/11/2013 23:38     Performed at [facility]   Culture     Final   Value:        BLOOD CULTURE RECEIVED NO GROWTH TO DATE CULTURE WILL BE HELD FOR 5 DAYS BEFORE ISSUING A FINAL NEGATIVE REPORT     Performed at [facility]   Report Status PENDING   Incomplete  CULTURE, BLOOD (ROUTINE X 2)     Status: None   Collection Time    09/11/13  5:40 PM      Result Value Range Status   Specimen Description BLOOD LEFT FOREARM   Final   Special Requests BOTTLES DRAWN AEROBIC ONLY [place]   Final   Culture  Setup Time     Final   Value: 09/11/2013 23:39     Performed at [facility]   Culture     Final   Value:        BLOOD CULTURE RECEIVED NO GROWTH TO DATE CULTURE WILL BE HELD FOR 5 DAYS BEFORE ISSUING A FINAL NEGATIVE REPORT     Performed at [facility]   Report Status PENDING   Incomplete   CULTURE, BLOOD (ROUTINE X 2)     Status: None   Collection Time    09/12/13 10:08 PM      Result Value Range Status   Specimen Description BLOOD LEFT HAND   Final   Special Requests BOTTLES DRAWN AEROBIC AND ANAEROBIC 10CC EACH   Final   Culture  Setup Time     Final   Value: 09/13/2013 04:19     Performed at [facility]   Culture     Final   Value:        BLOOD CULTURE RECEIVED NO GROWTH TO DATE CULTURE WILL BE HELD FOR 5 DAYS BEFORE ISSUING A FINAL NEGATIVE REPORT     Performed at [facility]   Report Status PENDING   Incomplete  CULTURE, BLOOD (ROUTINE X 2)     Status: None   Collection Time    09/12/13 10:13 PM      Result Value Range Status   Specimen Description BLOOD LEFT HAND FINGER   Final   Special Requests MIDDLE BOTTLES DRAWN AEROBIC ONLY 2CC   Final   Culture  Setup Time     Final   Value: 09/13/2013 04:18     Performed at [facility]   Culture     Final   Value:        BLOOD CULTURE RECEIVED NO GROWTH TO DATE CULTURE WILL BE HELD FOR 5 DAYS BEFORE ISSUING A FINAL NEGATIVE REPORT     Performed at [facility]   Report Status PENDING   Incomplete    Medical History: Past Medical History  Diagnosis Date  . Diabetes mellitus, type 2   . Secondary hyperparathyroidism   . OSA (obstructive sleep apnea)     NPSG 11.23.11-AHI 64.9/hr-central and obstructive apnea  . Ischemic cardiomyopathy  EF 25-30% 2011  . CAD (coronary artery disease)   . Pulmonary hypertension     Est PAsyst 60-Dr Linard Millers, off coumadin, CT neg for PE  . Hyperlipidemia   . Anemia   . Thyroid disease   . Pulmonary edema   . Peripheral vascular disease   . Sebaceous cyst   . 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 Richardson Dopp  . Irregular heart beat   . Shortness of breath     with exertion  . Hemodialysis patient     M,W,F  . TIA (transient ischemic attack) June 2014  . Ulcer of heel due to diabetes     left  . Kidney disease Dialysis MWF    ESRD  secondary to DM, failed transplant 2006. Dialysis    Medications:  Scheduled:  . amiodarone  400 mg Oral BID  . aspirin EC  81 mg Oral Daily  . atorvastatin  10 mg Oral QAC breakfast  . B-complex with vitamin C  1 tablet Oral Daily  . cinacalcet  60 mg Oral Q breakfast  . feeding supplement (RESOURCE BREEZE)  1 Container Oral TID BM  . insulin aspart  0-15 Units Subcutaneous TID WC  . insulin aspart  3 Units Subcutaneous TID WC  . midodrine  10 mg Oral TID WC  . multivitamin  1 tablet Oral QHS  . piperacillin-tazobactam (ZOSYN)  IV  2.25 g Intravenous Q8H  . sevelamer carbonate  1,600 mg Oral TID WC  . silver sulfADIAZINE   Topical BID  . sodium chloride  500 mL Intravenous Once  . vancomycin  1,000 mg Intravenous Q M,W,F-HD  . warfarin  1 mg Oral ONCE-1800  . warfarin   Does not apply Once  . Warfarin - Pharmacist Dosing Inpatient   Does not apply q1800   Infusions:  . sodium chloride 10 mL/hr at 09/14/13 1504  . norepinephrine (LEVOPHED) Adult infusion 6 mcg/min (09/14/13 2300)   Assessment: 58 yo F with ESRD and HD on MWF presented with right sided chest pain and found to have new onset afib.  Patient subsequently developed fever and pharmacy was consulted to dose vancomycin for sepsis, also on Zosyn.  Patient received a loading dose of 2g on 12/31. D/t volume overload, patient went to HD on Thursday and today on Saturday (off routine schedule).  Per nephrology note, plan is to return to normal MWF schedule next week.  Patient is afebrile with WBC trending down to 8.1.  A vancomycin random was drawn this morning and is right where it needs to be pre-HD at 25.7.  12/31 vanc>> 12/31 zosyn>>  Goal of Therapy:  Pre-HD vancomycin level 15-25 mcg/mL  Plan:  - continue Zosyn IV 2.25gm q8h, 30 min infusions - give vancomycin IV 750mg  d/t non-routine HD today (Sat) - change plan for vancomycin IV 750mg  with HD on MWF - f/u HD schedule and need for further supplemental doses -  monitor WBC, temperature curve, cultures, and clinical progression  Ovid Curd E. Jacqlyn Larsen, PharmD Clinical Pharmacist - Resident Pager: [phone] Pharmacy: [phone] 09/15/2013 9:44 AM

## 2013-09-15 NOTE — Progress Notes (Signed)
[facility] Progress Note    Subjective: feels better, more awake    Exam  Blood pressure 110/62, pulse 92, temperature 98.1 F (36.7 C), temperature source Oral, resp. rate 19, height 5\' 4"  (1.626 m), weight 83.4 kg (183 lb 13.8 oz), SpO2 98.00%. 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 New atrial fib with RVR: off heparin, s/p cardioversion in NSR, INR >2 on coumadin, amiodarone 2. Fever / PNA: on abx, improving 3. Hypotension/volume overload: vol excess resolved, is at dry wt now; cont midodrine, avoid BB and CCB for now if possible, wean pressors for SBP over 80 4. CM / EF 35-40% 5. ESRD:  6. Anemia - No ESA with hgb >12/ no iron 7. Metabolic bone disease - Hold Hectorol with mild Hypercalcemia corrected to 11.0 phos 6.3/ Renvela binder with meals 8. Nutrition: renal carb mod / renal vit 9. R BKA ulcer 10. AMS: resolved    Kelly Splinter MD  pager [phone]    cell [phone]  09/15/2013, 1:20 PM   Recent Labs Lab 09/10/13 0450 09/12/13 1026 09/13/13 1439 09/15/13 0400  NA 128* 135* 136* 138  K 5.3* 5.3 3.8 3.9  CL 87* 95* 97 96  CO2 25 23 26 26   GLUCOSE 142* 145* 157* 102*  BUN 47* 45* 26* 29*  CREATININE 5.79* 5.92* 4.03* 4.84*  CALCIUM 9.0 8.2* 7.4* 7.6*  PHOS 6.3* 6.9* 4.5  --     Recent Labs Lab 09/12/13 1026 09/13/13 1439 09/15/13 0400  AST  --   --  39*  ALT  --   --  31  ALKPHOS  --   --  114  BILITOT  --   --  0.7  PROT  --   --  5.9*  ALBUMIN 1.9* 1.7* 1.6*    Recent Labs Lab 09/13/13 0425 09/14/13 0340 09/15/13 0400  WBC 8.3 10.8* 8.1  HGB 14.0 15.3*  14.5  HCT 43.1 46.0 44.0  MCV 84.8 83.5 82.1  PLT 77* 79* 86*   . amiodarone  400 mg Oral BID  . aspirin EC  81 mg Oral Daily  . atorvastatin  10 mg Oral QAC breakfast  . B-complex with vitamin C  1 tablet Oral Daily  . cinacalcet  60 mg Oral Q breakfast  . feeding supplement (RESOURCE BREEZE)  1 Container Oral TID BM  . insulin aspart  0-15 Units Subcutaneous TID WC  . insulin aspart  3 Units Subcutaneous TID WC  . midodrine  10 mg Oral TID WC  . multivitamin  1 tablet Oral QHS  . piperacillin-tazobactam (ZOSYN)  IV  2.25 g Intravenous Q8H  . sevelamer carbonate  1,600 mg Oral TID WC  . silver sulfADIAZINE   Topical BID  . sodium chloride  500 mL Intravenous Once  . [START ON 09/17/2013] vancomycin  750 mg Intravenous Q M,W,F-HD  . warfarin  1 mg Oral ONCE-1800  . warfarin   Does not apply Once  . Warfarin - Pharmacist Dosing Inpatient  Does not apply q1800   . sodium chloride 10 mL/hr at 09/14/13 1504  . norepinephrine (LEVOPHED) Adult infusion 6 mcg/min (09/14/13 2300)

## 2013-09-15 NOTE — Progress Notes (Signed)
Subjective:  More alert today. Still having low blood pressure but no c/o SOB or chest pain.  Objective:  Vital Signs in the last 24 hours: BP 106/63  Pulse 90  Temp(Src) 97.9 F (36.6 C) (Oral)  Resp 24  Ht 5\' 4"  (1.626 m)  Wt 85.2 kg (187 lb 13.3 oz)  BMI 32.23 kg/m2  SpO2 99%  Physical Exam: Obese BF NAD on dialysis Lungs:  Clear Cardiac:  regular rhythm, normal S1 and S2, no S3 Abdomen:  Soft, nontender, no masses Extremities:  BKA noted  Intake/Output from previous day: 01/02 0701 - 01/03 0700 In: 1494.5 [P.O.:840; I.V.:404.5; IV Piggyback:250] Out: -  Weight Filed Weights   09/13/13 1429 09/13/13 1800 09/15/13 0730  Weight: 86.4 kg (190 lb 7.6 oz) 84.3 kg (185 lb 13.6 oz) 85.2 kg (187 lb 13.3 oz)    Lab Results: Basic Metabolic Panel:  Recent Labs  09/13/13 1439 09/15/13 0400  NA 136* 138  K 3.8 3.9  CL 97 96  CO2 26 26  GLUCOSE 157* 102*  BUN 26* 29*  CREATININE 4.03* 4.84*    CBC:  Recent Labs  09/14/13 0340 09/15/13 0400  WBC 10.8* 8.1  HGB 15.3* 14.5  HCT 46.0 44.0  MCV 83.5 82.1  PLT 79* 86*    BNP    Component Value Date/Time   PROBNP 32871.0* 09/08/2013 1546    PROTIME: Lab Results  Component Value Date   INR 2.55* 09/15/2013   INR 2.47* 09/14/2013   INR 3.12* 09/13/2013    Telemetry: Sinus rhythm  Assessment/Plan:  1. Atrial fib maintaining NSR on amiodarone 2. Hypotension overnight 3. ESRD 4. PVD  REC:  Continue amiodarone.  May need to use more midodrine for BP.    Kerry Hough  MD [facility]  09/15/2013, 9:17 AM

## 2013-09-16 ENCOUNTER — Inpatient Hospital Stay (HOSPITAL_COMMUNITY): Payer: Medicare Other

## 2013-09-16 LAB — GLUCOSE, CAPILLARY
GLUCOSE-CAPILLARY: 78 mg/dL (ref 70–99)
GLUCOSE-CAPILLARY: 91 mg/dL (ref 70–99)
GLUCOSE-CAPILLARY: 80 mg/dL (ref 70–99)
Glucose-Capillary: 60 mg/dL — ABNORMAL LOW (ref 70–99)

## 2013-09-16 LAB — CBC
HCT: 40.5 % (ref 36.0–46.0)
Hemoglobin: 13.2 g/dL (ref 12.0–15.0)
MCH: 27.2 pg (ref 26.0–34.0)
MCHC: 32.6 g/dL (ref 30.0–36.0)
MCV: 83.5 fL (ref 78.0–100.0)
PLATELETS: 81 10*3/uL — AB (ref 150–400)
RBC: 4.85 MIL/uL (ref 3.87–5.11)
RDW: 16.7 % — ABNORMAL HIGH (ref 11.5–15.5)
WBC: 5.7 10*3/uL (ref 4.0–10.5)

## 2013-09-16 LAB — BASIC METABOLIC PANEL
BUN: 17 mg/dL (ref 6–23)
CO2: 28 mEq/L (ref 19–32)
CREATININE: 3.53 mg/dL — AB (ref 0.50–1.10)
Calcium: 7.4 mg/dL — ABNORMAL LOW (ref 8.4–10.5)
Chloride: 99 mEq/L (ref 96–112)
GFR calc Af Amer: 15 mL/min — ABNORMAL LOW (ref >90)
GFR calc non Af Amer: 13 mL/min — ABNORMAL LOW (ref >90)
GLUCOSE: 68 mg/dL — AB (ref 70–99)
POTASSIUM: 4.2 meq/L (ref 3.7–5.3)
Sodium: 138 mEq/L (ref 137–147)

## 2013-09-16 LAB — PROTIME-INR
INR: 2.72 — ABNORMAL HIGH (ref 0.00–1.49)
Prothrombin Time: 27.9 seconds — ABNORMAL HIGH (ref 11.6–15.2)

## 2013-09-16 MED ORDER — WARFARIN 0.5 MG HALF TABLET
0.5000 mg | ORAL_TABLET | Freq: Once | ORAL | Status: AC
  Administered 2013-09-16: 0.5 mg via ORAL
  Filled 2013-09-16: qty 1

## 2013-09-16 NOTE — Progress Notes (Signed)
Subjective:  Feeling better today.  BP is better. No c/o SOB or chest pain.  Objective:  Vital Signs in the last 24 hours: BP 104/55  Pulse 92  Temp(Src) 98 F (36.7 C) (Oral)  Resp 16  Ht 5\' 4"  (1.626 m)  Wt 83.4 kg (183 lb 13.8 oz)  BMI 31.54 kg/m2  SpO2 96%  Physical Exam: Obese BF NAD Lungs:  Clear Cardiac:  regular rhythm, normal S1 and S2, no S3 Abdomen:  Soft, nontender, no masses Extremities:  BKA noted  Intake/Output from previous day: 01/03 0701 - 01/04 0700 In: 649.3 [P.O.:360; I.V.:39.3; IV Piggyback:250] Out: 2200   Weight Filed Weights   09/13/13 1800 09/15/13 0730 09/15/13 1130  Weight: 84.3 kg (185 lb 13.6 oz) 85.2 kg (187 lb 13.3 oz) 83.4 kg (183 lb 13.8 oz)    Lab Results: Basic Metabolic Panel:  Recent Labs  09/15/13 0400 09/16/13 0400  NA 138 138  K 3.9 4.2  CL 96 99  CO2 26 28  GLUCOSE 102* 68*  BUN 29* 17  CREATININE 4.84* 3.53*    CBC:  Recent Labs  09/15/13 0400 09/16/13 0400  WBC 8.1 5.7  HGB 14.5 13.2  HCT 44.0 40.5  MCV 82.1 83.5  PLT 86* 81*    BNP    Component Value Date/Time   PROBNP 32871.0* 09/08/2013 1546    PROTIME: Lab Results  Component Value Date   INR 2.72* 09/16/2013   INR 2.55* 09/15/2013   INR 2.47* 09/14/2013    Telemetry: Sinus rhythm with low atrial rhythm and PAC's  Assessment/Plan:  1. Atrial fib maintaining NSR on amiodarone 2. Hypotension overnight 3. ESRD 4. PVD  REC:  Continue amiodarone. To go to floor today.   Kerry Hough  MD [facility] Card[facility]  09/16/2013, 9:32 AM

## 2013-09-16 NOTE — Progress Notes (Signed)
ANTICOAGULATION CONSULT NOTE - Follow Up Consult  Pharmacy Consult for coumadin Indication: atrial fibrillation  Allergies  Allergen Reactions  . Neurontin [Gabapentin] Anaphylaxis  . Ezetimibe-Simvastatin Other (See Comments)    REACTION: face edema; itching  . Klonopin [Clonazepam] Other (See Comments)    hallucination  . Sertraline Hcl Other (See Comments)    hallucination    Patient Measurements: Height: 5\' 4"  (162.6 cm) Weight: 183 lb 13.8 oz (83.4 kg) IBW/kg (Calculated) : 54.7  Vital Signs: Temp: 98 F (36.7 C) (01/04 0800) Temp src: Oral (01/04 0800) BP: 102/55 mmHg (01/04 0900)  Labs:  Recent Labs  09/13/13 1439  09/14/13 0340 09/15/13 0400 09/16/13 0400  HGB  --   < > 15.3* 14.5 13.2  HCT  --   --  46.0 44.0 40.5  PLT  --   --  79* 86* 81*  LABPROT  --   --  25.9* 26.6* 27.9*  INR  --   --  2.47* 2.55* 2.72*  CREATININE 4.03*  --   --  4.84* 3.53*  < > = values in this interval not displayed.  Estimated Creatinine Clearance: 18.2 ml/min (by C-G formula based on Cr of 3.53).  Assessment: 57 y/o F on coumadin for afib (started on 12/28; baseline INR= 1.33). Patient is s/p TEE guided cardioversion to NSR on 12/30.  After receiving only one 5mg  dose of coumadin on 12/28, patient's INR trended up for the following 4 days (1.42>>2.05>>2.8>>3.12) and only began to trend downward into the therapeutic range on 01/02  to 2.47.  After a reduced dose of 1mg   daily x2, INR is staying therapeutic but trending up (2.47>>2.55>>this AM at 2.72). H/H is stable and wnl. Plt are low but stable.   Of note, amiodarone was added on 09/12/13 which can interact to potentiate the effects of Coumadin. Patient also on antibiotics for possible PNA/sepsis. D/w Dr.Z on 01/02 if plan to continue Coumadin or just consider heparin if INR falls <2.  We will continue with Coumadin for now, but will dose conservatively given response to initial dose and overall clinical picture.   Goal of  Therapy:  INR 2-3 Monitor platelets by anticoagulation protocol: Yes   Plan:  - give Coumadin PO 0.5mg  x1 dose tonight - daily PT/INR - daily CBC for now - monitor for s/s of bleeding  Ovid Curd E. Jacqlyn Larsen, PharmD Clinical Pharmacist - Resident Pager: [phone] Pharmacy: [phone] 09/16/2013 10:22 AM

## 2013-09-16 NOTE — Progress Notes (Signed)
PULMONARY / CRITICAL CARE MEDICINE  Name: [name] MRN: [id number] DOB: 1956-02-17    ADMISSION DATE:  09/08/2013 CONSULTATION DATE:  09/13/2013  REFERRING MD :  [facility]  CHIEF COMPLAINT:  Low blood pressure  BRIEF PATIENT DESCRIPTION:  58 yo female admitted with Rt sided chest pain and found to have A fib with RVR.  She developed new fever 12/30, and hypotension with decreased mental status after hemodialysis 12/31.  Transferred to ICU 1/01 and PCCM assumed care.  SIGNIFICANT EVENTS: 12/27 - Admit, cardiology consulted 12/29 - Renal consulted 12/30 - Cardioversion, fever 12/31 - Hypotension after HD 01/01 - Transfer to ICU, PCCM assume care, hypotension, decreased mental status  STUDIES:  12/28 Echo >> EF 30 to 35%, mild MR, mild LA dilation, severe RV systolic dysfx  LINES / TUBES: R upper arm AV graftL  IJ TLC  1/1 >>>  CULTURES: 12/27 Blood >>neg 12/30 Blood >>  12/31 Blood >>  ANTIBIOTICS: Vancomycin 12/31 >>> Zosyn 12/31 >>>  INTERVAL HISTORY:  Pt c/o R hand pain, relays as chronic neuropathy  VITAL SIGNS: Temp:  [97.9 F (36.6 C)-98.2 F (36.8 C)] 98.2 F (36.8 C) (01/03 1600) Pulse Rate:  [86-100] 92 (01/03 1130) Resp:  [0-27] 16 (01/04 0600) BP: (76-123)/(33-77) 99/57 mmHg (01/04 0600) SpO2:  [85 %-100 %] 97 % (01/04 0600) Weight:  [183 lb 13.8 oz (83.4 kg)] 183 lb 13.8 oz (83.4 kg) (01/03 1130) 3 liters   INTAKE / OUTPUT: Intake/Output     01/03 0701 - 01/04 0700 01/04 0701 - 01/05 0700   P.O. 360    I.V. (mL/kg) 39.3 (0.5)    IV Piggyback 250    Total Intake(mL/kg) 649.3 (7.8)    Other 2200    Total Output 2200     Net -1550.7            PHYSICAL EXAMINATION: General: No distress Neuro:  Awake, alert, oriented HEENT:  PERRRL Cardiovascular:  Regular, no murmur Lungs:  Bilateral diminished air entry Abdomen:  Soft, non tender, decreased bowel sounds Musculoskeletal:  R BKA stump dressing clean, L AKA stump clean Skin:  No  rashes  LABS:  CBC  Recent Labs Lab 09/14/13 0340 09/15/13 0400 09/16/13 0400  WBC 10.8* 8.1 5.7  HGB 15.3* 14.5 13.2  HCT 46.0 44.0 40.5  PLT 79* 86* 81*   Coag's  Recent Labs Lab 09/14/13 0340 09/15/13 0400 09/16/13 0400  INR 2.47* 2.55* 2.72*   BMET  Recent Labs Lab 09/13/13 1439 09/15/13 0400 09/16/13 0400  NA 136* 138 138  K 3.8 3.9 4.2  CL 97 96 99  CO2 26 26 28   BUN 26* 29* 17  CREATININE 4.03* 4.84* 3.53*  GLUCOSE 157* 102* 68*   Sepsis Markers  Recent Labs Lab 09/13/13 0150 09/13/13 1139 09/14/13 0345  LATICACIDVEN 1.9 1.2 0.9   Glucose  Recent Labs Lab 09/14/13 2134 09/15/13 0908 09/15/13 1125 09/15/13 1711 09/15/13 2156 09/16/13 0719  GLUCAP 71 117* 97 72 65* 78   CXR:  1/4 - no acute infiltrate or significant edema  ASSESSMENT / PLAN:  PULMONARY A: Hypoxia in setting of aspiration and probable pneumonia. Hx of OSA. P:   Goal SpO2>92 Supplemental oxygen PRN Home CPAP Pulmonary hygiene Mobilize as able  CARDIOVASCULAR A:  Hypotension >> ?from volume removal with HD versus sepsis.  Normally runs low blood pressures.  BP's have been in 84'O/96'E systolic per patient for the last year and is on midodrine BID.  New onset AF-RVR.  Hx of ischemic cardiomyopathy, CAD, PVD. P:  Cardiology following ASA, Lipitor, Amiodarone Midodrine TID Goal SBP of 80 with good mental status  RENAL A:   ESRD - Dry wt 83 kgs P:   Renal following HD Trend BMP  GASTROINTESTINAL A:   Nutrition. GI Px is not indicated. P:   Renal diet  HEMATOLOGIC A:   Anemia of chronic disease. Chronic thrombocytopenia. Therapeutic anticoagulation for AF. P:  Trend CBC Coumadin per pharmacy  INFECTIOUS A:   Suspected HCAP. P:   Abx / cx as above Consider 7 days abx & then d/c  ENDOCRINE A:   DM type II.   P:   SSI  NEUROLOGIC A:   Acute encephalopathy resolved Chronic Pain P:   PRN narcotics, caution with  BP  RHEUMATOLOGY A: Hx of SLE >> does not appear to be active issue. P: Monitor clinically   Transfer to medical tele & primary SVC to [facility] in am 1/5 0700.  PCCM will sign off.   Noe Gens, NP-C [facility] Pgr: [phone] or [phone]  I have personally obtained history, examined patient, evaluated and interpreted laboratory and imaging results, reviewed medical records, formulated assessment / plan and placed orders.   09/16/2013, 7:49 AM

## 2013-09-16 NOTE — Progress Notes (Signed)
[facility] Progress Note    Subjective: feels better   Exam  Blood pressure 96/55, pulse 92, temperature 98 F (36.7 C), temperature source Oral, resp. rate 15, height 5\' 4"  (1.626 m), weight 83.4 kg (183 lb 13.8 oz), SpO2 98.00%. Alert, no distress RRR no M or gallop Clear bilat, no rales or wheezing Abd bs + , soft, nontender  R BKA stump ulcer wrapped, trace edema LE's Neuro nonfocal, ox3 R arm AVF patent  Dialysis: [facility] MWF 4h   85kg   2K/2.25Ca Bath   LUA AVF   BFR 400  Heparin 2500   Hectorol 2  Epogen 0 Venofer 0  Labs= Last hgb 14.4 op 08/29/13 and tfs 34% 08/22/13 lasrt pth 145 08/22/13   Assessment/Plan  1. New atrial fib with RVR: off heparin, s/p cardioversion in NSR, INR >2 on coumadin, amiodarone 2. Fever / PNA: on abx D#5 3. Chronic Hypotension/volume excess: resolved, at dry wt > cont midodrine, avoid BB and CCB for now if possible, UF 1-2 kg with HD tomorrow 4. CM / EF 35-40% 5. ESRD: HD tomorrow 6. Anemia - No ESA with hgb >12/ no iron 7. Metabolic bone disease - holding vit D d/t high Ca; Ca better down to 9.5 adj; cont renvela as binder 8. Nutrition: renal carb mod / renal vit 9. R BKA ulcer 10. Dispo: looks back to baseline, probably could be ready for d/c home in a couple more days    Kelly Splinter MD  pager [phone]    cell [phone]  09/16/2013, 4:35 PM   Recent Labs Lab 09/10/13 0450 09/12/13 1026 09/13/13 1439 09/15/13 0400 09/16/13 0400  NA 128* 135* 136* 138 138  K 5.3* 5.3 3.8 3.9 4.2  CL 87* 95* 97 96 99  CO2 25 23 26 26 28   GLUCOSE 142* 145* 157* 102* 68*  BUN 47* 45* 26* 29* 17  CREATININE 5.79* 5.92* 4.03* 4.84* 3.53*  CALCIUM 9.0 8.2* 7.4* 7.6* 7.4*  PHOS 6.3* 6.9* 4.5  --   --     Recent Labs Lab 09/12/13 1026 09/13/13 1439 09/15/13 0400  AST  --   --  39*  ALT  --   --  31  ALKPHOS  --   --  114  BILITOT  --   --  0.7  PROT  --   --  5.9*  ALBUMIN 1.9* 1.7* 1.6*    Recent Labs Lab 09/14/13 0340  09/15/13 0400 09/16/13 0400  WBC 10.8* 8.1 5.7  HGB 15.3* 14.5 13.2  HCT 46.0 44.0 40.5  MCV 83.5 82.1 83.5  PLT 79* 86* 81*   . amiodarone  400 mg Oral BID  . aspirin EC  81 mg Oral Daily  . atorvastatin  10 mg Oral QAC breakfast  . B-complex with vitamin C  1 tablet Oral Daily  . cinacalcet  60 mg Oral Q breakfast  . feeding supplement (RESOURCE BREEZE)  1 Container Oral TID BM  . insulin aspart  0-15 Units Subcutaneous TID WC  . insulin aspart  3 Units Subcutaneous TID WC  . midodrine  10 mg Oral TID WC  . multivitamin  1 tablet Oral QHS  . piperacillin-tazobactam (ZOSYN)  IV  2.25 g Intravenous Q8H  . sevelamer carbonate  1,600 mg Oral TID WC  . silver sulfADIAZINE   Topical BID  . sodium chloride  500 mL Intravenous Once  . [START ON 09/17/2013] vancomycin  750 mg Intravenous Q M,W,F-HD  . warfarin  0.5 mg Oral ONCE-1800  . warfarin   Does not apply Once  . Warfarin - Pharmacist Dosing Inpatient   Does not apply q1800   . sodium chloride Stopped (09/15/13 1800)  . norepinephrine (LEVOPHED) Adult infusion Stopped (09/15/13 1800)

## 2013-09-16 NOTE — Progress Notes (Signed)
Attending:  I have seen and examined the patient with nurse practitioner/resident and agree with the note above.  MCQUAID, DOUGLAS [place] PCCM Pager: [phone] Cell: [phone] If no response, call [phone]  

## 2013-09-17 LAB — CULTURE, BLOOD (ROUTINE X 2)
Culture: NO GROWTH
Culture: NO GROWTH

## 2013-09-17 LAB — CBC
HCT: 45.3 % (ref 36.0–46.0)
Hemoglobin: 14.5 g/dL (ref 12.0–15.0)
MCH: 27.3 pg (ref 26.0–34.0)
MCHC: 32 g/dL (ref 30.0–36.0)
MCV: 85.3 fL (ref 78.0–100.0)
Platelets: 113 10*3/uL — ABNORMAL LOW (ref 150–400)
RBC: 5.31 MIL/uL — AB (ref 3.87–5.11)
RDW: 16.7 % — ABNORMAL HIGH (ref 11.5–15.5)
WBC: 6.5 10*3/uL (ref 4.0–10.5)

## 2013-09-17 LAB — RENAL FUNCTION PANEL
Albumin: 1.6 g/dL — ABNORMAL LOW (ref 3.5–5.2)
BUN: 27 mg/dL — ABNORMAL HIGH (ref 6–23)
CO2: 23 meq/L (ref 19–32)
Calcium: 7.4 mg/dL — ABNORMAL LOW (ref 8.4–10.5)
Chloride: 95 mEq/L — ABNORMAL LOW (ref 96–112)
Creatinine, Ser: 4.99 mg/dL — ABNORMAL HIGH (ref 0.50–1.10)
GFR, EST AFRICAN AMERICAN: 10 mL/min — AB (ref >90)
GFR, EST NON AFRICAN AMERICAN: 9 mL/min — AB (ref >90)
Glucose, Bld: 97 mg/dL (ref 70–99)
POTASSIUM: 4.9 meq/L (ref 3.7–5.3)
Phosphorus: 4.6 mg/dL (ref 2.3–4.6)
SODIUM: 134 meq/L — AB (ref 137–147)

## 2013-09-17 LAB — GLUCOSE, CAPILLARY
GLUCOSE-CAPILLARY: 79 mg/dL (ref 70–99)
Glucose-Capillary: 75 mg/dL (ref 70–99)
Glucose-Capillary: 81 mg/dL (ref 70–99)
Glucose-Capillary: 80 mg/dL (ref 70–99)

## 2013-09-17 LAB — PROTIME-INR
INR: 3.08 — ABNORMAL HIGH (ref 0.00–1.49)
PROTHROMBIN TIME: 30.7 s — AB (ref 11.6–15.2)

## 2013-09-17 MED ORDER — LIDOCAINE HCL (PF) 1 % IJ SOLN: 5.0000 mL | INTRAMUSCULAR | Status: DC | PRN

## 2013-09-17 MED ORDER — HEPARIN SODIUM (PORCINE) 1000 UNIT/ML DIALYSIS: 2500.0000 [IU] | Freq: Once | INTRAMUSCULAR | Status: DC

## 2013-09-17 MED ORDER — SODIUM CHLORIDE 0.9 % IV SOLN: 100.0000 mL | INTRAVENOUS | Status: DC | PRN

## 2013-09-17 MED ORDER — ALTEPLASE 2 MG IJ SOLR
2.0000 mg | Freq: Once | INTRAMUSCULAR | Status: AC | PRN
  Filled 2013-09-17: qty 2

## 2013-09-17 MED ORDER — NEPRO/CARBSTEADY PO LIQD: 237.0000 mL | ORAL | Status: DC | PRN

## 2013-09-17 MED ORDER — PENTAFLUOROPROP-TETRAFLUOROETH EX AERO: 1.0000 "application " | INHALATION_SPRAY | CUTANEOUS | Status: DC | PRN

## 2013-09-17 MED ORDER — LIDOCAINE-PRILOCAINE 2.5-2.5 % EX CREA: 1.0000 "application " | TOPICAL_CREAM | CUTANEOUS | Status: DC | PRN

## 2013-09-17 MED ORDER — HEPARIN SODIUM (PORCINE) 1000 UNIT/ML DIALYSIS: 1000.0000 [IU] | INTRAMUSCULAR | Status: DC | PRN

## 2013-09-17 NOTE — Progress Notes (Signed)
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  No chest pain or shortness of breath.  Awaiting hemodialysis today.  CURRENT MEDS . amiodarone  400 mg Oral BID  . aspirin EC  81 mg Oral Daily  . atorvastatin  10 mg Oral QAC breakfast  . B-complex with vitamin C  1 tablet Oral Daily  . cinacalcet  60 mg Oral Q breakfast  . feeding supplement (RESOURCE BREEZE)  1 Container Oral TID BM  . insulin aspart  0-15 Units Subcutaneous TID WC  . insulin aspart  3 Units Subcutaneous TID WC  . midodrine  10 mg Oral TID WC  . multivitamin  1 tablet Oral QHS  . piperacillin-tazobactam (ZOSYN)  IV  2.25 g Intravenous Q8H  . sevelamer carbonate  1,600 mg Oral TID WC  . silver sulfADIAZINE   Topical BID  . vancomycin  750 mg Intravenous Q M,W,F-HD  . warfarin   Does not apply Once  . Warfarin - Pharmacist Dosing Inpatient   Does not apply q1800    OBJECTIVE  Filed Vitals:   09/16/13 2130 09/16/13 2300 09/16/13 2355 09/17/13 0004  BP:  85/48 91/55   Pulse:      Temp:   97.8 F (36.6 C)   TempSrc:   Oral   Resp: 11 19 18    Height:      Weight:   186 lb 11.2 oz (84.687 kg)   SpO2: 98% 98% 99% 98%    Intake/Output Summary (Last 24 hours) at 09/17/13 1140 Last data filed at 09/17/13 0730  Gross per 24 hour  Intake    730 ml  Output      0 ml  Net    730 ml   Filed Weights   09/15/13 0730 09/15/13 1130 09/16/13 2355  Weight: 187 lb 13.3 oz (85.2 kg) 183 lb 13.8 oz (83.4 kg) 186 lb 11.2 oz (84.687 kg)    PHYSICAL EXAM  General: Pleasant,  NAD. Neuro: Alert and oriented X 3. Moves all extremities spontaneously. Psych: Normal affect. HEENT:  Normal  Neck: Supple without bruits or JVD. Lungs:  Resp regular and unlabored, CTA. Heart: RRR no s3, s4, or murmurs. Abdomen: Soft, non-tender, non-distended, BS + x 4.  Extremities: BKA on the right, AKA on the left.  Accessory Clinical Findings  CBC  Recent Labs  09/16/13 0400 09/17/13 0515  WBC 5.7 6.5  HGB 13.2 14.5  HCT 40.5 45.3  MCV 83.5 85.3  PLT 81* 408*   Basic Metabolic Panel  Recent Labs  09/15/13 0400 09/16/13 0400  NA 138 138  K 3.9 4.2  CL 96 99  CO2 26 28  GLUCOSE 102* 68*  BUN 29* 17  CREATININE 4.84* 3.53*  CALCIUM 7.6* 7.4*   Liver Function Tests  Recent Labs  09/15/13 0400  AST 39*  ALT 31  ALKPHOS 114  BILITOT 0.7  PROT 5.9*  ALBUMIN 1.6*   No results found for this basename: LIPASE, AMYLASE,  in the last 72 hours Cardiac Enzymes No results found for this basename: CKTOTAL, CKMB, CKMBINDEX,  TROPONINI,  in the last 72 hours BNP No components found with this basename: POCBNP,  D-Dimer No results found for this basename: DDIMER,  in the last 72 hours Hemoglobin A1C No results found for this basename: HGBA1C,  in the last 72 hours Fasting Lipid Panel No results found for this basename: CHOL, HDL, LDLCALC, TRIG, CHOLHDL, LDLDIRECT,  in the last 72 hours Thyroid Function Tests No results found for this basename: TSH, T4TOTAL, FREET3, T3FREE, THYROIDAB,  in the last 72 hours  TELE  Telemetry shows normal sinus rhythm  ECG    Radiology/Studies  Dg Chest 2 View  09/08/2013   CLINICAL DATA:  Chest pain, shortness of breath, history coronary artery disease post CABG, diabetes, ischemic cardiomyopathy, on CPAP at night  EXAM: CHEST  2 VIEW  COMPARISON:  02/13/2013  FINDINGS: Enlargement of cardiac silhouette post CABG.  Slight pulmonary vascular congestion.  Mediastinal contours normal.  Peribronchial thickening with right  basilar infiltrate question pneumonia.  Slight chronic accentuation of perihilar markings little changed from previous study.  No gross pleural effusion or pneumothorax.  No acute osseous findings.  IMPRESSION: Enlargement of cardiac silhouette with pulmonary vascular congestion post CABG.  Bronchitic changes with right lower lobe infiltrate question pneumonia.   Electronically Signed   By: Lavonia Dana M.D.   On: 09/08/2013 15:48   Dg Chest Port 1 View  09/16/2013   CLINICAL DATA:  Assess airspace disease.  EXAM: PORTABLE CHEST - 1 VIEW  COMPARISON:  09/15/2013  FINDINGS: Left jugular central line tip is probably at the junction of the SVC and azygos vein. Central line positioning has not significantly changed. There is improved aeration of the lungs suggesting decreased interstitial edema. Heart size is stable. Patient has median sternotomy wires. No evidence for a pneumothorax.  IMPRESSION: Decreasing interstitial edema.  Central line placement as described.   Electronically Signed   By: Markus Daft M.D.   On: 09/16/2013 08:17   Dg Chest Port 1 View  09/16/2013   CLINICAL DATA:  Edema.  EXAM: PORTABLE CHEST - 1 VIEW  COMPARISON:  09/13/2013.  FINDINGS: The left IJ central venous catheter remains directed into the azygos vein. The heart size and mediastinal contours are stable status post CABG. Pulmonary edema and basilar aeration have improved. There is no pneumothorax or significant pleural effusion.  IMPRESSION: Improvement in pulmonary edema and bibasilar aeration. No pneumothorax.   Electronically Signed   By: Camie Patience M.D.   On: 09/16/2013 07:04   Dg Chest Port 1 View  09/13/2013   CLINICAL DATA:  Central venous line  EXAM: PORTABLE CHEST - 1 VIEW  COMPARISON:  Prior radiograph from 09/12/2013  FINDINGS: There has been interval placement of a left subclavian central venous catheter with tip overlying the proximal -mid right atrium. No pneumothorax.  Cardiomegaly with sequelae of prior CABG again  noted. There has been interval worsening of pulmonary edema as compared to the prior examination, greatest within the right hemi thorax. Superimposed infiltrate is not excluded. Small bilateral pleural effusions suspected.  No acute osseous abnormality.  IMPRESSION: 1. Interval placement of left subclavian central venous catheter with tip overlying the proximal -mid right atrium. 2. Interval worsening of pulmonary edema. Suspect small bilateral pleural effusions.   Electronically Signed   By: Jeannine Boga M.D.   On: 09/13/2013 06:05   Dg Chest Port 1 View  09/12/2013   CLINICAL DATA:  Diabetes. Ischemic cardiomyopathy. Coronary artery disease. Fever.  EXAM: PORTABLE CHEST - 1  VIEW  COMPARISON:  09/08/2013  FINDINGS: Prior median sternotomy. Mildly degraded exam due to AP portable technique and patient body habitus. Midline trachea. Cardiomegaly accentuated by AP portable technique. Mild right hemidiaphragm elevation. No right and no definite left pleural effusion. Patchy left greater than right bibasilar airspace disease.  IMPRESSION: Cardiomegaly and low lung volumes.  Patchy bibasilar opacities. Favor atelectasis. Difficult to exclude early infection or aspiration. If PA and lateral radiographs are possible, they should be considered.   Electronically Signed   By: Abigail Miyamoto M.D.   On: 09/12/2013 21:36   Dg Chest Port 1v Same Day  09/13/2013   CLINICAL DATA:  Followup pulmonary edema  EXAM: PORTABLE CHEST - 1 VIEW SAME DAY  COMPARISON:  0446 hr chest radiograph earlier today  FINDINGS: Mild to moderate cardiac enlargement in patient who is status post CABG. Limited inspiratory effect.  There is vascular congestion with mild to moderate diffuse interstitial prominence. The degree of interstitial prominence is mildly improved.  Left central line position has changed. The left central line appears to have been slightly retracted, with tip now curving cranially projecting over the superior vena cava/  brachiocephalic vein on the right.  IMPRESSION: Congestive heart failure with mildly improved pulmonary edema.  See above description of change in position of left subclavian central line, which appears to have been retracted, with line now curving cranially and tip projecting over the superior vena cava/ right brachiocephalic region.   Electronically Signed   By: Skipper Cliche M.D.   On: 09/13/2013 17:11    ASSESSMENT AND PLAN 1. Atrial fib maintaining NSR on amiodarone  2. Mild hypotension, asymptomatic.blood pressure is too low to add beta blocker at this point. 3. ESRD  4. PVD  Plan: Continue current medication  Signed, Darlin Coco MD

## 2013-09-17 NOTE — Progress Notes (Signed)
[name] Progress Note    Subjective:  Feels fine Wondering when she will go to dialysis today Has maintained NSR BP's remain soft despite midodrine - No room for BB   Exam  Blood pressure 91/55, pulse 92, temperature 97.8 F (36.6 C), temperature source Oral, resp. rate 18, height 5\' 4"  (1.626 m), weight 84.687 kg (186 lb 11.2 oz), SpO2 98.00%. Alert, no distress RRR no M or gallop Clear bilat, no rales or wheezing Abd bs + , soft, nontender  R BKA stump ulcer wrapped, Left AKA no lesions; trace edema  Neuro nonfocal, ox3 R arm AVF patent + bruit  Dialysis: [facility] MWF 4h   85kg   2K/2.25Ca Bath   LUA AVF   BFR 400  Heparin 2500   Hectorol 2  Epogen 0 Venofer 0  Labs= Last hgb 14.4 op 08/29/13 and tfs 34% 08/22/13 lasrt pth 145 08/22/13   Assessment/Plan  1. New atrial fib with RVR: off heparin, s/p cardioversion in NSR, INR >2 on coumadin, amiodarone 2. Fever / PNA: on abx D#6 (vanvo/zosyn) 3. Chronic Hypotension/volume excess: resolved, at dry wt > cont midodrine, avoid BB and CCB for now if possible, UF 1-2 kg with HD today as tolerated 4. CM / EF 35-40% 5. ESRD: MWF [facility] 6. Anemia - No ESA with hgb >12/ no iron 7. Metabolic bone disease - holding vit D d/t high Ca; corrected Ca better down to 9.5 or so adj; cont renvela as binder 8. Nutrition: renal carb mod / renal vit 9. R BKA ulcer - not clinically infected (pressure ulcer from prosthesis) 10. Dispo: looks back to baseline ? Home soon?   Additional Objective Data (no labs yet today)   Recent Labs Lab 09/12/13 1026 09/13/13 1439 09/15/13 0400 09/16/13 0400  NA 135* 136* 138 138  K 5.3 3.8 3.9 4.2  CL 95* 97 96 99  CO2 23 26 26 28   GLUCOSE 145* 157* 102* 68*  BUN 45* 26* 29* 17  CREATININE 5.92* 4.03* 4.84* 3.53*  CALCIUM 8.2* 7.4* 7.6* 7.4*  PHOS 6.9* 4.5  --   --     Recent Labs Lab 09/12/13 1026 09/13/13 1439 09/15/13 0400  AST  --   --  39*  ALT  --   --  31  ALKPHOS  --   --   114  BILITOT  --   --  0.7  PROT  --   --  5.9*  ALBUMIN 1.9* 1.7* 1.6*    Recent Labs Lab 09/15/13 0400 09/16/13 0400 09/17/13 0515  WBC 8.1 5.7 6.5  HGB 14.5 13.2 14.5  HCT 44.0 40.5 45.3  MCV 82.1 83.5 85.3  PLT 86* 81* 113*   MEDICATIONS  . amiodarone  400 mg Oral BID  . aspirin EC  81 mg Oral Daily  . atorvastatin  10 mg Oral QAC breakfast  . B-complex with vitamin C  1 tablet Oral Daily  . cinacalcet  60 mg Oral Q breakfast  . feeding supplement (RESOURCE BREEZE)  1 Container Oral TID BM  . insulin aspart  0-15 Units Subcutaneous TID WC  . insulin aspart  3 Units Subcutaneous TID WC  . midodrine  10 mg Oral TID WC  . multivitamin  1 tablet Oral QHS  . piperacillin-tazobactam (ZOSYN)  IV  2.25 g Intravenous Q8H  . sevelamer carbonate  1,600 mg Oral TID WC  . silver sulfADIAZINE   Topical BID  . vancomycin  750 mg Intravenous Q M,W,F-HD  .  warfarin   Does not apply Once  . Warfarin - Pharmacist Dosing Inpatient   Does not apply q1800   . sodium chloride Stopped (09/15/13 1800)

## 2013-09-17 NOTE — Progress Notes (Signed)
ANTICOAGULATION CONSULT NOTE - Follow Up Consult  Pharmacy Consult for coumadin Indication: atrial fibrillation  Allergies  Allergen Reactions  . Neurontin [Gabapentin] Anaphylaxis  . Ezetimibe-Simvastatin Other (See Comments)    REACTION: face edema; itching  . Klonopin [Clonazepam] Other (See Comments)    hallucination  . Sertraline Hcl Other (See Comments)    hallucination    Patient Measurements: Height: 5\' 4"  (162.6 cm) Weight: 186 lb 11.2 oz (84.687 kg) IBW/kg (Calculated) : 54.7  Vital Signs:    Labs:  Recent Labs  09/15/13 0400 09/16/13 0400 09/17/13 0515  HGB 14.5 13.2 14.5  HCT 44.0 40.5 45.3  PLT 86* 81* 113*  LABPROT 26.6* 27.9* 30.7*  INR 2.55* 2.72* 3.08*  CREATININE 4.84* 3.53*  --     Estimated Creatinine Clearance: 18.3 ml/min (by C-G formula based on Cr of 3.53).  Assessment: 58 y/o F on coumadin for afib (started on 12/28; baseline INR= 1.33). Patient is s/p TEE guided cardioversion to NSR on 12/30.  Pt seems very sensitive to coumadin since INR is now supratherapeutic at 3.08 after 0.5 mg dose yesterday. H/H is stable and wnl. Plt are low but trending up   Of note, amiodarone was added on 09/12/13 which can interact to potentiate the effects of Coumadin. Patient also on antibiotics for possible PNA/sepsis. D/w Dr.Z on 01/02 if plan to continue Coumadin or just consider heparin if INR falls <2.  We will continue with Coumadin for now, but will dose conservatively given response to initial dose and overall clinical picture.   Goal of Therapy:  INR 2-3 Monitor platelets by anticoagulation protocol: Yes   Plan:  - Hold Coumadin tonight  - daily PT/INR - daily CBC for now - monitor for s/s of bleeding  Albertina Parr, PharmD.  Clinical Pharmacist Pager [phone]

## 2013-09-17 NOTE — Progress Notes (Addendum)
INITIAL NUTRITION ASSESSMENT  DOCUMENTATION CODES Per approved criteria  -Morbid Obesity   INTERVENTION:  Recommend Renal 80/90-10-15-1198 ml diet  Continue Resource Breeze daily PRN (250 kcals, 9 gm protein per 8 fl oz carton) RD to follow for nutrition care plan  NUTRITION DIAGNOSIS: Increased nutrient needs related to ESRD on HD as evidenced by estimated nutrition needs  Goal: Pt to meet >/= 90% of their estimated nutrition needs   Monitor:  PO & supplemental intake, weight, labs, I/O's  Reason for Assessment: Malnutrition Screening Tool Report  58 y.o. female  Admitting Dx: Atrial fibrillation with RVR  ASSESSMENT: Patient with PMH of ESRD on HD who presented to ED with complaints of right sided chest pain; found to have new onset of atrial fibrillation with RVR; developed new fever 12/30 and hypotension with decreased mental status after hemodialysis 12/3; transferred to ICU.  Patient transferred to 2W-Cardiac from [facility] 1/4.  Patient reports she vomiting today after eating some of her salad for lunch.  PO intake variable at 25-50% per flowsheet records.  Drinks Boost High Protein nutrition supplements at home.  Has Resource Breeze supplement ordered PRN -- patient has drank it on and off during her hospitalization.  Height: Ht Readings from Last 1 Encounters:  09/08/13 5\' 4"  (1.626 m)    Weight: Wt Readings from Last 1 Encounters:  09/16/13 186 lb 11.2 oz (84.687 kg)    Ideal Body Weight: 120 lb -- adjusted for bilateral AKA's  % Ideal Body Weight: 155%  Wt Readings from Last 10 Encounters:  09/16/13 186 lb 11.2 oz (84.687 kg)  09/16/13 186 lb 11.2 oz (84.687 kg)  08/22/13 181 lb (82.101 kg)  07/18/13 181 lb 14.1 oz (82.5 kg)  06/29/13 178 lb 9.2 oz (81 kg)  05/29/13 176 lb 5.9 oz (80 kg)  05/08/13 174 lb (78.926 kg)  05/02/13 174 lb (78.926 kg)  04/13/13 174 lb 9.7 oz (79.2 kg)  03/30/13 185 lb 3 oz (84 kg)    Usual Body Weight: 178  lb  % Usual Body Weight: 104%  BMI:  39.6 kg/m2 -- adjusted for bilateral AKA's  Estimated Nutritional Needs: Kcal: 1850-2050 Protein: 95-105 gm Fluid: 1200 ml  Skin: Intact  Diet Order: Renal 60/70-10-15-1198 ml  EDUCATION NEEDS: -No education needs identified at this time   Intake/Output Summary (Last 24 hours) at 09/17/13 1558 Last data filed at 09/17/13 0730  Gross per 24 hour  Intake    440 ml  Output      0 ml  Net    440 ml    Labs:   Recent Labs Lab 09/12/13 1026 09/13/13 1439 09/15/13 0400 09/16/13 0400  NA 135* 136* 138 138  K 5.3 3.8 3.9 4.2  CL 95* 97 96 99  CO2 23 26 26 28   BUN 45* 26* 29* 17  CREATININE 5.92* 4.03* 4.84* 3.53*  CALCIUM 8.2* 7.4* 7.6* 7.4*  PHOS 6.9* 4.5  --   --   GLUCOSE 145* 157* 102* 68*    CBG (last 3)   Recent Labs  09/16/13 2251 09/17/13 0609 09/17/13 1145  GLUCAP 79 75 81    Scheduled Meds: . amiodarone  400 mg Oral BID  . aspirin EC  81 mg Oral Daily  . atorvastatin  10 mg Oral QAC breakfast  . B-complex with vitamin C  1 tablet Oral Daily  . cinacalcet  60 mg Oral Q breakfast  . feeding supplement (RESOURCE BREEZE)  1 Container Oral TID BM  .  insulin aspart  0-15 Units Subcutaneous TID WC  . insulin aspart  3 Units Subcutaneous TID WC  . midodrine  10 mg Oral TID WC  . multivitamin  1 tablet Oral QHS  . piperacillin-tazobactam (ZOSYN)  IV  2.25 g Intravenous Q8H  . sevelamer carbonate  1,600 mg Oral TID WC  . silver sulfADIAZINE   Topical BID  . vancomycin  750 mg Intravenous Q M,W,F-HD  . warfarin   Does not apply Once  . Warfarin - Pharmacist Dosing Inpatient   Does not apply q1800    Continuous Infusions:   Past Medical History  Diagnosis Date  . Diabetes mellitus, type 2   . Secondary hyperparathyroidism   . OSA (obstructive sleep apnea)     NPSG 11.23.11-AHI 64.9/hr-central and obstructive apnea  . Ischemic cardiomyopathy     EF 25-30% 2011  . CAD (coronary artery disease)   . Pulmonary  hypertension     Est PAsyst 60-Dr Linard Millers, off coumadin, CT neg for PE  . Hyperlipidemia   . Anemia   . Thyroid disease   . Pulmonary edema   . Peripheral vascular disease   . Sebaceous cyst   . 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 Richardson Dopp  . Irregular heart beat   . Shortness of breath     with exertion  . Hemodialysis patient     M,W,F  . TIA (transient ischemic attack) June 2014  . Ulcer of heel due to diabetes     left  . Kidney disease Dialysis MWF    ESRD secondary to DM, failed transplant 2006. Dialysis    Past Surgical History  Procedure Laterality Date  . Coronary artery bypass graft  2011    3V  . Insertion of dialysis catheter    . 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 Nasal mucosal biopsy    . Ovarian cyst surgery    . 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 Laser retinal surgery  2011  . Kidney transplant      rejection  . Abdominal angiogram Left 01/25/13  . Colonoscopy    . Cardiac catheterization    . Eye surgery    . Femoral-popliteal bypass graft Left 02/20/2013    Procedure:  FEMORAL-to below knee POPLITEAL ARTERY bypasswith saphenous vein with intraoperative angiogram ;  Surgeon: Angelia Mould, MD;  Location: [place];  Service: Vascular;  Laterality: Left;  fem-below knee pop  . Amputation Left 03/27/2013    Procedure: AMPUTATION ABOVE KNEE;  Surgeon: Angelia Mould, MD;  Location: [place];  Service: Vascular;  Laterality: Left;  . Tee without cardioversion N/A 09/11/2013    Procedure: TRANSESOPHAGEAL ECHOCARDIOGRAM (TEE);  Surgeon: Casandra Doffing, MD;  Location: [facility] ENDOSCOPY;  Service: Cardiovascular;  Laterality: N/A;  . Cardioversion N/A 09/11/2013    Procedure: CARDIOVERSION;  Surgeon: Casandra Doffing, MD;  Location: [place];  Service: Cardiovascular;  Laterality: N/A;    Arthur Holms, RD, LDN Pager #: [phone] After-Hours Pager #: [phone]

## 2013-09-17 NOTE — Progress Notes (Addendum)
[facility] Progress Note [place] TEAM 1 - Stepdown/ICU TEAM   [name] [id number] DOB: April 25, 1956 DOA: 09/08/2013 PCP: Placido Sou, MD  Admit HPI / Brief Narrative: 59 yo female admitted with Rt sided chest pain and found to have A fib with RVR. She developed new fever 12/30, and hypotension with decreased mental status after hemodialysis 12/31. Transferred to ICU 1/01 and PCCM assumed care.   SIGNIFICANT EVENTS:  12/27 - Admit, Cardiology consulted  12/28 TTE >> EF 30-35%, mild MR, mild LA dilation, severe RV systolic dysfx [id number] - Renal consulted  12/30 - TEE DCCV - fever  12/31 - Hypotension after HD  1/01 - Transfer to ICU, PCCM assumed care, hypotension, decreased mental status  1/05 - [facility] assumed care   HPI/Subjective: Pt is resting comfortably at present.  She denies cp, sob, n/v, abdom pain, f/c.    Assessment/Plan:  Hypoxia in setting of aspiration and HCAP Saturations much improved - plan to complete 7 days of abx tx total   New onset AF-RVR maintaining NSR on amiodarone - on coumadin - ?f/u at [facility] coumadin clinic? - care as per Cardiology   Hypotension Persisting, but well tolerated - no no suspect meds - follow w/ HD today - is already on mododrine  ESRD on HD M/W/F s/p failed transplant Due for HD today  Anemia of chronic disease Hgb stable - pt actually not currently anemic   DM type II CBG well controlled   Chronic thrombocytopenia noted to start this past Aug  Hx of ischemic cardiomyopathy, CAD, PVD Stable   Hx of OSA w/ Pulm HTN Cont home CPAP regimen  Hx of SLE does not appear to be an active issue  Code Status: FULL Family Communication: no family present at time of exam Disposition Plan: for HD today - possible d/c home in next 24-48hrs if BP improves/stabilizes   Consultants: Neph[facility] >> PCCM >> [facility]  Antibiotics: Vancomycin 12/31 > Zosyn 12/31 >  DVT  prophylaxis: warfarin  Objective: Blood pressure 81/50, pulse 80, temperature 97.1 F (36.2 C), temperature source Oral, resp. rate 18, height 5\' 4"  (1.626 m), weight 84.687 kg (186 lb 11.2 oz), SpO2 100.00%.  Intake/Output Summary (Last 24 hours) at 09/17/13 1430 Last data filed at 09/17/13 0730  Gross per 24 hour  Intake    440 ml  Output      0 ml  Net    440 ml   Exam: General: No acute respiratory distress at rest  Lungs: very faint bibasilar crackles - no wheeze  Cardiovascular: Regular rate and rhythm without murmur gallop or rub normal S1 and S2 Abdomen: Nontender, nondistended, soft, bowel sounds positive, no rebound, no ascites, no appreciable mass Extremities: No significant cyanosis, or clubbing B UE - B LE amputations   Data Reviewed: Basic Metabolic Panel:  Recent Labs Lab 09/12/13 1026 09/13/13 1439 09/15/13 0400 09/16/13 0400  NA 135* 136* 138 138  K 5.3 3.8 3.9 4.2  CL 95* 97 96 99  CO2 23 26 26 28   GLUCOSE 145* 157* 102* 68*  BUN 45* 26* 29* 17  CREATININE 5.92* 4.03* 4.84* 3.53*  CALCIUM 8.2* 7.4* 7.6* 7.4*  PHOS 6.9* 4.5  --   --    Liver Function Tests:  Recent Labs Lab 09/12/13 1026 09/13/13 1439 09/15/13 0400  AST  --   --  39*  ALT  --   --  31  ALKPHOS  --   --  114  BILITOT  --   --  0.7  PROT  --   --  5.9*  ALBUMIN 1.9* 1.7* 1.6*    Recent Labs Lab 09/14/13 0949  AMMONIA 43   CBC:  Recent Labs Lab 09/13/13 0425 09/14/13 0340 09/15/13 0400 09/16/13 0400 09/17/13 0515  WBC 8.3 10.8* 8.1 5.7 6.5  HGB 14.0 15.3* 14.5 13.2 14.5  HCT 43.1 46.0 44.0 40.5 45.3  MCV 84.8 83.5 82.1 83.5 85.3  PLT 77* 79* 86* 81* 113*   CBG:  Recent Labs Lab 09/16/13 1732 09/16/13 1810 09/16/13 2251 09/17/13 0609 09/17/13 1145  GLUCAP 60* 80 79 75 81    Recent Results (from the past 240 hour(s))  MRSA PCR SCREENING     Status: None   Collection Time    09/08/13  9:30 PM      Result Value Range Status   MRSA by PCR NEGATIVE   NEGATIVE Final   Comment:            The GeneXpert MRSA Assay (FDA     approved for NASAL specimens     only), is one component of a     comprehensive MRSA colonization     surveillance program. It is not     intended to diagnose MRSA     infection nor to guide or     monitor treatment for     MRSA infections.  CULTURE, BLOOD (ROUTINE X 2)     Status: None   Collection Time    09/08/13 11:53 PM      Result Value Range Status   Specimen Description BLOOD LEFT ARM   Final   Special Requests BOTTLES DRAWN AEROBIC ONLY 10CC   Final   Culture  Setup Time     Final   Value: 09/09/2013 14:18     Performed at [facility]   Culture     Final   Value: NO GROWTH 5 DAYS     Performed at [facility]   Report Status 09/15/2013 FINAL   Final  CULTURE, BLOOD (ROUTINE X 2)     Status: None   Collection Time    09/08/13 11:59 PM      Result Value Range Status   Specimen Description BLOOD LEFT HAND   Final   Special Requests BOTTLES DRAWN AEROBIC ONLY 8CC   Final   Culture  Setup Time     Final   Value: 09/09/2013 14:18     Performed at [facility]   Culture     Final   Value: NO GROWTH 5 DAYS     Performed at [facility]   Report Status 09/15/2013 FINAL   Final  CULTURE, BLOOD (ROUTINE X 2)     Status: None   Collection Time    09/11/13  5:30 PM      Result Value Range Status   Specimen Description BLOOD LEFT HAND   Final   Special Requests     Final   Value: BOTTLES DRAWN AEROBIC AND ANAEROBIC 10CC AER,5CC ANA   Culture  Setup Time     Final   Value: 09/11/2013 23:38     Performed at [facility]   Culture     Final   Value: NO GROWTH 5 DAYS     Performed at [facility]   Report Status 09/17/2013 FINAL   Final  CULTURE, BLOOD (ROUTINE X 2)     Status: None   Collection Time  09/11/13  5:40 PM      Result Value Range Status   Specimen Description BLOOD LEFT FOREARM   Final   Special Requests BOTTLES DRAWN AEROBIC ONLY [facility]    Final   Culture  Setup Time     Final   Value: 09/11/2013 23:39     Performed at [facility]   Culture     Final   Value: NO GROWTH 5 DAYS     Performed at [facility]   Report Status 09/17/2013 FINAL   Final  CULTURE, BLOOD (ROUTINE X 2)     Status: None   Collection Time    09/12/13 10:08 PM      Result Value Range Status   Specimen Description BLOOD LEFT HAND   Final   Special Requests BOTTLES DRAWN AEROBIC AND ANAEROBIC 10CC EACH   Final   Culture  Setup Time     Final   Value: 09/13/2013 04:19     Performed at [facility]   Culture     Final   Value:        BLOOD CULTURE RECEIVED NO GROWTH TO DATE CULTURE WILL BE HELD FOR 5 DAYS BEFORE ISSUING A FINAL NEGATIVE REPORT     Performed at [facility]   Report Status PENDING   Incomplete  CULTURE, BLOOD (ROUTINE X 2)     Status: None   Collection Time    09/12/13 10:13 PM      Result Value Range Status   Specimen Description BLOOD LEFT HAND FINGER   Final   Special Requests MIDDLE BOTTLES DRAWN AEROBIC ONLY 2CC   Final   Culture  Setup Time     Final   Value: 09/13/2013 04:18     Performed at [facility]   Culture     Final   Value:        BLOOD CULTURE RECEIVED NO GROWTH TO DATE CULTURE WILL BE HELD FOR 5 DAYS BEFORE ISSUING A FINAL NEGATIVE REPORT     Performed at [facility]   Report Status PENDING   Incomplete     Studies:  Recent x-ray studies have been reviewed in detail by the Attending Physician  Scheduled Meds:  Scheduled Meds: . amiodarone  400 mg Oral BID  . aspirin EC  81 mg Oral Daily  . atorvastatin  10 mg Oral QAC breakfast  . B-complex with vitamin C  1 tablet Oral Daily  . cinacalcet  60 mg Oral Q breakfast  . feeding supplement (RESOURCE BREEZE)  1 Container Oral TID BM  . insulin aspart  0-15 Units Subcutaneous TID WC  . insulin aspart  3 Units Subcutaneous TID WC  . midodrine  10 mg Oral TID WC  . multivitamin  1 tablet Oral QHS  .  piperacillin-tazobactam (ZOSYN)  IV  2.25 g Intravenous Q8H  . sevelamer carbonate  1,600 mg Oral TID WC  . silver sulfADIAZINE   Topical BID  . vancomycin  750 mg Intravenous Q M,W,F-HD  . warfarin   Does not apply Once  . Warfarin - Pharmacist Dosing Inpatient   Does not apply q1800    Time spent on care of this patient: 25 mins   [place]  [phone] Pager - Text Page per Shea Evans as per below:  On-Call/Text Page:      [email or web address]      password [email or web address]  If 7PM-7AM, please contact night-coverage [email or web address] Password [facility] 09/17/2013,  2:30 PM   LOS: 9 days

## 2013-09-17 NOTE — Progress Notes (Signed)
The Chaplain stopped by for an initial visit with the patient today but the patient was resting, so the visit will have to be followed up the next day,  Chaplain [employer]

## 2013-09-18 ENCOUNTER — Ambulatory Visit (HOSPITAL_COMMUNITY): Admission: RE | Admit: 2013-09-18 | Payer: Medicare Other | Source: Ambulatory Visit | Admitting: Gastroenterology

## 2013-09-18 ENCOUNTER — Encounter (HOSPITAL_COMMUNITY): Admission: RE | Payer: Self-pay | Source: Ambulatory Visit

## 2013-09-18 DIAGNOSIS — J189 Pneumonia, unspecified organism: Secondary | ICD-10-CM | POA: Clinically undetermined

## 2013-09-18 LAB — CBC
HCT: 42.7 % (ref 36.0–46.0)
HEMOGLOBIN: 13.9 g/dL (ref 12.0–15.0)
MCH: 27.5 pg (ref 26.0–34.0)
MCHC: 32.6 g/dL (ref 30.0–36.0)
MCV: 84.6 fL (ref 78.0–100.0)
Platelets: 143 10*3/uL — ABNORMAL LOW (ref 150–400)
RBC: 5.05 MIL/uL (ref 3.87–5.11)
RDW: 16.7 % — ABNORMAL HIGH (ref 11.5–15.5)
WBC: 7.6 10*3/uL (ref 4.0–10.5)

## 2013-09-18 LAB — GLUCOSE, CAPILLARY
Glucose-Capillary: 70 mg/dL (ref 70–99)
Glucose-Capillary: 90 mg/dL (ref 70–99)
Glucose-Capillary: 81 mg/dL (ref 70–99)

## 2013-09-18 LAB — PROTIME-INR
INR: 3.15 — AB (ref 0.00–1.49)
Prothrombin Time: 31.2 seconds — ABNORMAL HIGH (ref 11.6–15.2)

## 2013-09-18 SURGERY — COLONOSCOPY
Anesthesia: Moderate Sedation

## 2013-09-18 MED ORDER — AMIODARONE HCL 200 MG PO TABS
200.0000 mg | ORAL_TABLET | Freq: Two times a day (BID) | ORAL | Status: DC
  Filled 2013-09-18: qty 1

## 2013-09-18 MED ORDER — ASPIRIN EC 81 MG PO TBEC: 81.0000 mg | DELAYED_RELEASE_TABLET | Freq: Every day | ORAL | Status: DC

## 2013-09-18 MED ORDER — SILVER SULFADIAZINE 1 % EX CREA: TOPICAL_CREAM | Freq: Two times a day (BID) | CUTANEOUS | Status: DC

## 2013-09-18 MED ORDER — WARFARIN SODIUM 4 MG PO TABS: 2.0000 mg | ORAL_TABLET | Freq: Every day | ORAL | Status: DC

## 2013-09-18 MED ORDER — BOOST / RESOURCE BREEZE PO LIQD: 1.0000 | Freq: Three times a day (TID) | ORAL | Status: DC

## 2013-09-18 MED ORDER — AMIODARONE HCL 200 MG PO TABS: 200.0000 mg | ORAL_TABLET | Freq: Two times a day (BID) | ORAL | Status: DC

## 2013-09-18 NOTE — Progress Notes (Signed)
Pt signed off of hemodialysis Tx 14 min early for hand hurting. Goal was still met despite signing off.

## 2013-09-18 NOTE — Progress Notes (Signed)
Pt. Refuses CPAP at this time. Pt. States that she has her home CPAP but doesn't want to use it. RN is aware.

## 2013-09-18 NOTE — Progress Notes (Signed)
NURSING PROGRESS NOTE  HILLARI ZUMWALT [id number] Discharge Data: 09/18/2013 8:23 PM Attending Provider: No att. providers found HDQ:[id number],JJHER Carlean Jews, MD     Bartholome Bill to be D/C'd Home per MD order.  Discussed with the patient the After Visit Summary and all questions fully answered. All IV's discontinued with no bleeding noted. All belongings returned to patient for patient to take home.  Patient was given her prescriptions by the doctor prior to d/c instruction explanation. She was also instructed that Dr. Wynelle Cleveland wanted her to have her INR lab drawn tomorrow (09/19/13) at the [facility]  cou[facility] clinic. Patient verbalized having an understanding of the instructions.   Last Vital Signs:  Blood pressure 102/63, pulse 82, temperature 98.2 F (36.8 C), temperature source Oral, resp. rate 18, height 5\' 4"  (1.626 m), weight 82.9 kg (182 lb 12.2 oz), SpO2 94.00%.  Discharge Medication List   Medication List    STOP taking these medications       aspirin 325 MG tablet  Replaced by:  aspirin EC 81 MG tablet      TAKE these medications       acetaminophen 325 MG tablet  Commonly known as:  TYLENOL  Take 650 mg by mouth every 6 (six) hours as needed for pain.     amiodarone 200 MG tablet  Commonly known as:  PACERONE  Take 1 tablet (200 mg total) by mouth 2 (two) times daily.     aspirin EC 81 MG tablet  Take 1 tablet (81 mg total) by mouth daily.     atorvastatin 10 MG tablet  Commonly known as:  LIPITOR  Take 10 mg by mouth daily before breakfast.     B-complex with vitamin C tablet  Take 1 tablet by mouth daily.     cinacalcet 60 MG tablet  Commonly known as:  SENSIPAR  Take 60 mg by mouth daily.     feeding supplement (RESOURCE BREEZE) Liqd  Take 1 Container by mouth 3 (three) times daily between meals.     folic acid-vitamin b complex-vitamin c-selenium-zinc 3 MG Tabs tablet  Take 1 tablet by mouth daily.     HYDROcodone-acetaminophen 5-325 MG per tablet  Commonly  known as:  NORCO/VICODIN  Take 1 tablet by mouth every 6 (six) hours as needed for moderate pain.     insulin aspart 100 UNIT/ML injection  Commonly known as:  novoLOG  Inject 3-15 Units into the skin 3 (three) times daily before meals. Sliding scale     midodrine 10 MG tablet  Commonly known as:  PROAMATINE  Take 10 mg by mouth every Monday, Wednesday, and Friday.     pregabalin 25 MG capsule  Commonly known as:  LYRICA  Take 25 mg by mouth 2 (two) times daily.     sevelamer carbonate 800 MG tablet  Commonly known as:  RENVELA  Take 1,600 mg by mouth 3 (three) times daily with meals.     silver sulfADIAZINE 1 % cream  Commonly known as:  SILVADENE  Apply topically 2 (two) times daily.     warfarin 4 MG tablet  Commonly known as:  COUMADIN  Take 0.5 tablets (2 mg total) by mouth daily.

## 2013-09-18 NOTE — Discharge Summary (Addendum)
Physician Discharge Summary  [name] [id number] DOB: [birth year] DOA: 09/08/2013  PCP: Placido Sou, MD  Admit date: 09/08/2013 Discharge date: 09/18/2013  Time spent: >45 minutes  Recommendations for Outpatient Follow-up:  1. 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 She underwent a DCCV on 12/30.  She developed new fever later in the day on 12/30, and hypotension with decreased mental status after hemodialysis 12/31. Transferred to ICU 1/01 and PCCM assumed care.   SIGNIFICANT EVENTS:  12/27 - Admit, Cardiology consulted  12/28 TTE >> EF 30-35%, mild MR, mild LA dilation, severe RV systolic dysfx  [id number] - Renal consulted  12/30 - TEE DCCV - fever  12/31 - Hypotension after HD  1/01 - Transfer to ICU, PCCM assumed care, hypotension, decreased mental status  1/05 - TRH assumed care    Hospital Course:   New onset AF-RVR  - maintaining NSR on amiodarone - on coumadin - - care as per Cardiology  - INR super therapeutic at 3/15  - have given her a prescription for Coumadin but asked her not to start yet  - will need INR checked at Coumadin clinic tomorrow  Hypoxia in setting of aspiration and HCAP (12/30) - pt noted to be febrile on 12/30- asymptomatic but CXR  revealed b/l infiltrates - 7 days of antibiotics as recommended by PCCM completed today  Hypotension  Persisting, but well tolerated - no no suspect meds  - is already on mododrine   ESRD on HD M/W/F s/p failed transplant  - cont outpt dialysis  Anemia of chronic disease  Hgb stable - pt actually not currently anemic   DM type II  CBG well controlled   Chronic thrombocytopenia  noted to start this past Aug   Hx of ischemic cardiomyopathy, CAD, PVD  Stable   Hx of OSA w/ Pulm HTN  Cont home CPAP regimen   Hx of SLE  does not appear to be an active issue   Procedures:  12/30 - DCCV  Consultations:  Cardiology  Nephrology  Discharge Exam: Filed Vitals:   09/18/13 0452  BP: 102/63  Pulse: 82  Temp: 98.2 F (36.8 C)  Resp: 18    General: AAO x 3 Cardiovascular: RRR, no murmurs Respiratory: CTA b/l Extemites- b/l amputations- would on right stump with mild serosanguinous discharge  Discharge Instructions      Discharge Orders   Future Appointments Provider Department Dept Phone   09/20/2013 2:30 PM [facility] [phone]   10/30/2013 10:00 AM Kathrynn Ducking, MD [facility] [phone]   11/21/2013 1:30 PM [facility] [phone]   11/21/2013 2:00 PM Angelia Mould, MD [facility] [phone]   11/27/2013 9:30  AM Deneise Lever, MD [facility] [phone]   01/14/2014 2:15 PM Sinclair Grooms, MD [facility] [phone]   02/19/2014 9:00 AM [facility] [phone]   02/19/2014 9:30 AM [facility] [phone]   Future Orders Complete By Expires   Diet - low sodium heart healthy  As directed    Comments:     Renal and diabetic diet   Diet renal 60/70-10-15-1198  As  directed    Discharge instructions  As directed    Comments:     Check INR tomorrow on dialysis and start Coumadin at 2.5 mg if < 3.0 Repeat INR in 3 days   Increase activity slowly  As directed        Medication List    STOP taking these medications       aspirin 325 MG tablet  Replaced by:  aspirin EC 81 MG tablet      TAKE these medications       acetaminophen 325 MG tablet  Commonly known as:  TYLENOL  Take 650 mg by mouth every 6 (six) hours as needed for pain.     amiodarone 200 MG tablet  Commonly known as:  PACERONE  Take 1 tablet (200 mg total) by mouth 2 (two) times daily.     aspirin EC 81 MG tablet  Take 1 tablet (81 mg total) by mouth daily.     atorvastatin 10 MG tablet  Commonly known as:  LIPITOR  Take 10 mg by mouth daily before breakfast.     B-complex with vitamin C tablet  Take 1 tablet by mouth daily.     cinacalcet 60 MG tablet  Commonly known as:  SENSIPAR  Take 60 mg by mouth daily.     feeding supplement (RESOURCE BREEZE) Liqd  Take 1 Container by mouth 3 (three) times daily between meals.     folic acid-vitamin b complex-vitamin c-selenium-zinc 3 MG Tabs tablet  Take 1 tablet by mouth daily.     HYDROcodone-acetaminophen 5-325 MG per tablet  Commonly known as:  NORCO/VICODIN  Take 1 tablet by mouth every 6 (six) hours as needed for moderate pain.     insulin aspart 100 UNIT/ML injection  Commonly known as:  novoLOG  Inject 3-15 Units into the skin 3 (three) times daily before meals. Sliding scale     midodrine 10 MG tablet  Commonly known as:  PROAMATINE  Take 10 mg by mouth every Monday, Wednesday, and Friday.     pregabalin 25 MG capsule  Commonly known as:  LYRICA  Take 25 mg by mouth 2 (two) times daily.     sevelamer carbonate 800 MG tablet  Commonly known as:  RENVELA  Take 1,600 mg by mouth 3 (three) times daily with meals.     silver sulfADIAZINE 1 % cream  Commonly known as:  SILVADENE  Apply topically 2 (two)  times daily.     warfarin 4 MG tablet  Commonly known as:  COUMADIN  Take 0.5 tablets (2 mg total) by mouth daily.       Allergies  Allergen Reactions  . Neurontin [Gabapentin] Anaphylaxis  . Ezetimibe-Simvastatin Other (See Comments)    REACTION: face edema; itching  . Klonopin [Clonazepam] Other (See Comments)    hallucination  . Sertraline Hcl Other (See Comments)    hallucination   Follow-up Information   Follow up with [facility]. (have INR done tomorrow)    Specialty:  Cardiology   Contact information:   [address], [place] [address] [place] [address] [phone]      Schedule an appointment as soon as possible for a visit with Sinclair Grooms, MD.   Specialty:  Cardiology   Contact information:   [id number] [place] [address] [phone]        The results of significant diagnostics from this hospitalization (including imaging, microbiology, ancillary and laboratory) are listed below for reference.    Significant Diagnostic Studies: Dg Chest 2 View  09/08/2013   CLINICAL DATA:  Chest pain, shortness of breath, history coronary artery disease post CABG, diabetes, ischemic cardiomyopathy, on CPAP at night  EXAM: CHEST  2 VIEW  COMPARISON:  02/13/2013  FINDINGS: Enlargement of cardiac silhouette post CABG.  Slight pulmonary vascular congestion.  Mediastinal contours normal.  Peribronchial thickening with right basilar infiltrate question pneumonia.  Slight chronic accentuation of perihilar markings little changed from previous study.  No gross pleural effusion or pneumothorax.  No acute osseous findings.  IMPRESSION: Enlargement of cardiac silhouette with pulmonary vascular congestion post CABG.  Bronchitic changes with right lower lobe infiltrate question pneumonia.   Electronically Signed   By: Lavonia Dana M.D.   On: 09/08/2013 15:48   Dg Chest Port 1 View  09/16/2013   CLINICAL DATA:  Assess airspace disease.   EXAM: PORTABLE CHEST - 1 VIEW  COMPARISON:  09/15/2013  FINDINGS: Left jugular central line tip is probably at the junction of the SVC and azygos vein. Central line positioning has not significantly changed. There is improved aeration of the lungs suggesting decreased interstitial edema. Heart size is stable. Patient has median sternotomy wires. No evidence for a pneumothorax.  IMPRESSION: Decreasing interstitial edema.  Central line placement as described.   Electronically Signed   By: Markus Daft M.D.   On: 09/16/2013 08:17   Dg Chest Port 1 View  09/16/2013   CLINICAL DATA:  Edema.  EXAM: PORTABLE CHEST - 1 VIEW  COMPARISON:  09/13/2013.  FINDINGS: The left IJ central venous catheter remains directed into the azygos vein. The heart size and mediastinal contours are stable status post CABG. Pulmonary edema and basilar aeration have improved. There is no pneumothorax or significant pleural effusion.  IMPRESSION: Improvement in pulmonary edema and bibasilar aeration. No pneumothorax.   Electronically Signed   By: Camie Patience M.D.   On: 09/16/2013 07:04   Dg Chest Port 1 View  09/13/2013   CLINICAL DATA:  Central venous line  EXAM: PORTABLE CHEST - 1 VIEW  COMPARISON:  Prior radiograph from 09/12/2013  FINDINGS: There has been interval placement of a left subclavian central venous catheter with tip overlying the proximal -mid right atrium. No pneumothorax.  Cardiomegaly with sequelae of prior CABG again noted. There has been interval worsening of pulmonary edema as compared to the prior examination, greatest within the right hemi thorax. Superimposed infiltrate is not excluded. Small bilateral pleural effusions suspected.  No acute osseous abnormality.  IMPRESSION: 1. Interval placement of left subclavian central venous catheter with tip overlying the proximal -mid right atrium. 2. Interval worsening of pulmonary edema. Suspect small bilateral pleural effusions.   Electronically Signed   By: Jeannine Boga  M.D.   On: 09/13/2013 06:05   Dg Chest Port 1 View  09/12/2013   CLINICAL DATA:  Diabetes. Ischemic cardiomyopathy. Coronary artery disease. Fever.  EXAM: PORTABLE CHEST -  1 VIEW  COMPARISON:  09/08/2013  FINDINGS: Prior median sternotomy. Mildly degraded exam due to AP portable technique and patient body habitus. Midline trachea. Cardiomegaly accentuated by AP portable technique. Mild right hemidiaphragm elevation. No right and no definite left pleural effusion. Patchy left greater than right bibasilar airspace disease.  IMPRESSION: Cardiomegaly and low lung volumes.  Patchy bibasilar opacities. Favor atelectasis. Difficult to exclude early infection or aspiration. If PA and lateral radiographs are possible, they should be considered.   Electronically Signed   By: Jeronimo Greaves M.D.   On: 09/12/2013 21:36   Dg Chest Port 1v Same Day  09/13/2013   CLINICAL DATA:  Followup pulmonary edema  EXAM: PORTABLE CHEST - 1 VIEW SAME DAY  COMPARISON:  0446 hr chest radiograph earlier today  FINDINGS: Mild to moderate cardiac enlargement in patient who is status post CABG. Limited inspiratory effect.  There is vascular congestion with mild to moderate diffuse interstitial prominence. The degree of interstitial prominence is mildly improved.  Left central line position has changed. The left central line appears to have been slightly retracted, with tip now curving cranially projecting over the superior vena cava/ brachiocephalic vein on the right.  IMPRESSION: Congestive heart failure with mildly improved pulmonary edema.  See above description of change in position of left subclavian central line, which appears to have been retracted, with line now curving cranially and tip projecting over the superior vena cava/ right brachiocephalic region.   Electronically Signed   By: Esperanza Heir M.D.   On: 09/13/2013 17:11    Microbiology: Recent Results (from the past 240 hour(s))  MRSA PCR SCREENING     Status: None    Collection Time    09/08/13  9:30 PM      Result Value Range Status   MRSA by PCR NEGATIVE  NEGATIVE Final   Comment:            The GeneXpert MRSA Assay (FDA     approved for NASAL specimens     only), is one component of a     comprehensive MRSA colonization     surveillance program. It is not     intended to diagnose MRSA     infection nor to guide or     monitor treatment for     MRSA infections.  CULTURE, BLOOD (ROUTINE X 2)     Status: None   Collection Time    09/08/13 11:53 PM      Result Value Range Status   Specimen Description BLOOD LEFT ARM   Final   Special Requests BOTTLES DRAWN AEROBIC ONLY 10CC   Final   Culture  Setup Time     Final   Value: 09/09/2013 14:18     Performed at [facility]   Culture     Final   Value: NO GROWTH 5 DAYS     Performed at [facility]   Report Status 09/15/2013 FINAL   Final  CULTURE, BLOOD (ROUTINE X 2)     Status: None   Collection Time    09/08/13 11:59 PM      Result Value Range Status   Specimen Description BLOOD LEFT HAND   Final   Special Requests BOTTLES DRAWN AEROBIC ONLY 8CC   Final   Culture  Setup Time     Final   Value: 09/09/2013 14:18     Performed at [facility]   Culture     Final   Value: NO  GROWTH 5 DAYS     Performed at [facility]   Report Status 09/15/2013 FINAL   Final  CULTURE, BLOOD (ROUTINE X 2)     Status: None   Collection Time    09/11/13  5:30 PM      Result Value Range Status   Specimen Description BLOOD LEFT HAND   Final   Special Requests     Final   Value: BOTTLES DRAWN AEROBIC AND ANAEROBIC 10CC AER,5CC ANA   Culture  Setup Time     Final   Value: 09/11/2013 23:38     Performed at [facility]   Culture     Final   Value: NO GROWTH 5 DAYS     Performed at [facility]   Report Status 09/17/2013 FINAL   Final  CULTURE, BLOOD (ROUTINE X 2)     Status: None   Collection Time    09/11/13  5:40 PM      Result Value Range Status    Specimen Description BLOOD LEFT FOREARM   Final   Special Requests BOTTLES DRAWN AEROBIC ONLY 6CC   Final   Culture  Setup Time     Final   Value: 09/11/2013 23:39     Performed at [facility]   Culture     Final   Value: NO GROWTH 5 DAYS     Performed at [facility]   Report Status 09/17/2013 FINAL   Final  CULTURE, BLOOD (ROUTINE X 2)     Status: None   Collection Time    09/12/13 10:08 PM      Result Value Range Status   Specimen Description BLOOD LEFT HAND   Final   Special Requests BOTTLES DRAWN AEROBIC AND ANAEROBIC 10CC EACH   Final   Culture  Setup Time     Final   Value: 09/13/2013 04:19     Performed at [facility]   Culture     Final   Value:        BLOOD CULTURE RECEIVED NO GROWTH TO DATE CULTURE WILL BE HELD FOR 5 DAYS BEFORE ISSUING A FINAL NEGATIVE REPORT     Performed at [facility]   Report Status PENDING   Incomplete  CULTURE, BLOOD (ROUTINE X 2)     Status: None   Collection Time    09/12/13 10:13 PM      Result Value Range Status   Specimen Description BLOOD LEFT HAND FINGER   Final   Special Requests MIDDLE BOTTLES DRAWN AEROBIC ONLY 2CC   Final   Culture  Setup Time     Final   Value: 09/13/2013 04:18     Performed at [facility]   Culture     Final   Value:        BLOOD CULTURE RECEIVED NO GROWTH TO DATE CULTURE WILL BE HELD FOR 5 DAYS BEFORE ISSUING A FINAL NEGATIVE REPORT     Performed at [facility]   Report Status PENDING   Incomplete     Labs: Basic Metabolic Panel:  Recent Labs Lab 09/12/13 1026 09/13/13 1439 09/15/13 0400 09/16/13 0400 09/17/13 2152  NA 135* 136* 138 138 134*  K 5.3 3.8 3.9 4.2 4.9  CL 95* 97 96 99 95*  CO2 23 26 26 28 23   GLUCOSE 145* 157* 102* 68* 97  BUN 45* 26* 29* 17 27*  CREATININE 5.92* 4.03* 4.84* 3.53* 4.99*  CALCIUM 8.2* 7.4* 7.6*  7.4* 7.4*  PHOS 6.9* 4.5  --   --  4.6   Liver Function Tests:  Recent Labs Lab 09/12/13 1026 09/13/13 1439  09/15/13 0400 09/17/13 2152  AST  --   --  39*  --   ALT  --   --  31  --   ALKPHOS  --   --  114  --   BILITOT  --   --  0.7  --   PROT  --   --  5.9*  --   ALBUMIN 1.9* 1.7* 1.6* 1.6*   No results found for this basename: LIPASE, AMYLASE,  in the last 168 hours  Recent Labs Lab 09/14/13 0949  AMMONIA 43   CBC:  Recent Labs Lab 09/14/13 0340 09/15/13 0400 09/16/13 0400 09/17/13 0515 09/18/13 0400  WBC 10.8* 8.1 5.7 6.5 7.6  HGB 15.3* 14.5 13.2 14.5 13.9  HCT 46.0 44.0 40.5 45.3 42.7  MCV 83.5 82.1 83.5 85.3 84.6  PLT 79* 86* 81* 113* 143*   Cardiac Enzymes: No results found for this basename: CKTOTAL, CKMB, CKMBINDEX, TROPONINI,  in the last 168 hours BNP: BNP (last 3 results)  Recent Labs  09/08/13 1546  PROBNP 32871.0*   CBG:  Recent Labs Lab 09/17/13 1145 09/17/13 1643 09/18/13 0200 09/18/13 0620 09/18/13 1125  GLUCAP 81 80 70 90 81       SignedDebbe Odea, MD [facility] 09/18/2013, 4:47 PM

## 2013-09-18 NOTE — Progress Notes (Signed)
ANTICOAGULATION AND ANTIBIOTIC CONSULT NOTE - Follow Up Consult  Pharmacy Consult for coumadin and Vancomycin Indication: atrial fibrillation, HCAP  Allergies  Allergen Reactions  . Neurontin [Gabapentin] Anaphylaxis  . Ezetimibe-Simvastatin Other (See Comments)    REACTION: face edema; itching  . Klonopin [Clonazepam] Other (See Comments)    hallucination  . Sertraline Hcl Other (See Comments)    hallucination    Patient Measurements: Height: 5\' 4"  (162.6 cm) Weight: 182 lb 12.2 oz (82.9 kg) IBW/kg (Calculated) : 54.7  Vital Signs: Temp: 98.2 F (36.8 C) (01/06 0452) Temp src: Oral (01/06 0452) BP: 102/63 mmHg (01/06 0452) Pulse Rate: 82 (01/06 0452)  Labs:  Recent Labs  09/16/13 0400 09/17/13 0515 09/17/13 2152 09/18/13 0400  HGB 13.2 14.5  --  13.9  HCT 40.5 45.3  --  42.7  PLT 81* 113*  --  143*  LABPROT 27.9* 30.7*  --  31.2*  INR 2.72* 3.08*  --  3.15*  CREATININE 3.53*  --  4.99*  --     Estimated Creatinine Clearance: 12.8 ml/min (by C-G formula based on Cr of 4.99).  Assessment: 58 y/o F on coumadin for afib (started on 12/28; baseline INR= 1.33). Patient is s/p TEE guided cardioversion to NSR on 12/30.  Pt seems very sensitive to coumadin since INR remains supratherapeutic after holding dose yesterday. H/H is stable and wnl. Plt are low but trending up. Anticipate INR to trend down tomorrow.   He is also on Vancomycin and Zosyn for HCAP. A pre-HD level Vanc level was elevated with Vancomycin 1 gm Q HD. Dose was then reduced to 750 mg IV Q HD. Plan is to complete 7 days of abx total. Tomorrow should be the last day. WBC wnl, Afebrile. MWF HD schedule. Tolerated HD yesterday. Cultures as follows:  12/31 vanc>> 12/31 zosyn>>  12/31 blood x2 - ngtd  12/30 blood x2 - neg 12/27 blood x2 - neg    Goal of Therapy:  INR 2-3 Pre-HD Vanc level 15-25  Monitor platelets by anticoagulation protocol: Yes   Plan:  - continue Zosyn IV 2.25gm q8h -Continue  vancomycin 750 mg IV with each HD session  - monitor WBC, temperature curve, cultures, and clinical progression - hold coumadin tonight  - daily PT/INR - daily CBC for now - monitor for s/s of bleeding   Albertina Parr, PharmD.  Clinical Pharmacist Pager [phone]

## 2013-09-18 NOTE — Progress Notes (Signed)
Central line dcd as ordered and per protocol,  tip intact, pressure held and dressing applied. Patient reminded to stay in the bed for 30 minutes.  Will continue to monitor patient. Won Kreuzer, Bettina Gavia RN

## 2013-09-18 NOTE — Progress Notes (Signed)
[facility] Progress Note   Subjective:  Feels fine Signed off HD a little early yesterday due to hand pain (says often occurs toward end of HD) No pre/post wts done in HD - recorded weight today 82.9 with outpt EDW 85 kg  Physical Examination   Blood pressure 102/63, pulse 82, temperature 98.2 F (36.8 C), temperature source Oral, resp. rate 18, height 5\' 4"  (1.626 m), weight 82.9 kg (182 lb 12.2 oz), SpO2 94.00%. Alert, no distress RRR no M or gallop Clear bilat, no rales or wheezing Abd bs + , soft, nontender  R BKA stump ulcer wrapped, Left AKA no lesions; no edema Neuro nonfocal, ox3 R arm AVF patent + bruit; hands warm bilat  Dialysis: [facility] MWF 4h   85kg   2K/2.25Ca [place]   LUA AVF   BFR 400  Heparin 2500   Hectorol 2  Epogen 0 Venofer 0  Labs= Last hgb 14.4 op 08/29/13 and tfs 34% 08/22/13 lasrt pth 145 08/22/13   Assessment/Plan  1. New atrial fib with RVR: off heparin, s/p cardioversion in NSR, INR >2 on coumadin, amiodarone; no room for BB; will need to have coumadin followed by [facility] at discharge 2. Fever / PNA: on abx D#7(vanco/zosyn) No pulm sx at present 3. Chronic Hypotension/volume excess: resolved, at/sl below EDW > cont midodrine, avoid BB If weights correlate may have sl lower EDW at discharge  4. CM / EF 35-40% 5. ESRD: MWF [facility]  New EDW 83.5 (with clothes) 6. Anemia - No ESA with hgb >12/ no iron 7. Metabolic bone disease - holding vit D d/t high Ca; corrected Ca better down to 9.5 or so adj; cont renvela as binder 8. Nutrition: renal carb mod / renal vit 9. R BKA ulcer - not clinically infected (pressure ulcer from prosthesis) 10. Dispo: looks back to baseline ? From renal standpoint could go home today.  Will write HD orders just in case she is still here tomorrow.   Additional Objective Data   Recent Labs Lab 09/12/13 1026 09/13/13 1439 09/15/13 0400 09/16/13 0400 09/17/13 2152  NA 135* 136* 138 138 134*  K 5.3 3.8 3.9 4.2 4.9   CL 95* 97 96 99 95*  CO2 23 26 26 28 23   GLUCOSE 145* 157* 102* 68* 97  BUN 45* 26* 29* 17 27*  CREATININE 5.92* 4.03* 4.84* 3.53* 4.99*  CALCIUM 8.2* 7.4* 7.6* 7.4* 7.4*  PHOS 6.9* 4.5  --   --  4.6    Recent Labs Lab 09/13/13 1439 09/15/13 0400 09/17/13 2152  AST  --  39*  --   ALT  --  31  --   ALKPHOS  --  114  --   BILITOT  --  0.7  --   PROT  --  5.9*  --   ALBUMIN 1.7* 1.6* 1.6*    Recent Labs Lab 09/16/13 0400 09/17/13 0515 09/18/13 0400  WBC 5.7 6.5 7.6  HGB 13.2 14.5 13.9  HCT 40.5 45.3 42.7  MCV 83.5 85.3 84.6  PLT 81* 113* 143*   Lab Results  Component Value Date   INR 3.15* 09/18/2013   INR 3.08* 09/17/2013   INR 2.72* 09/16/2013    MEDICATIONS  . amiodarone  400 mg Oral BID  . aspirin EC  81 mg Oral Daily  . atorvastatin  10 mg Oral QAC breakfast  . B-complex with vitamin C  1 tablet Oral Daily  . cinacalcet  60 mg Oral Q breakfast  .  feeding supplement (RESOURCE BREEZE)  1 Container Oral TID BM  . insulin aspart  0-15 Units Subcutaneous TID WC  . insulin aspart  3 Units Subcutaneous TID WC  . midodrine  10 mg Oral TID WC  . multivitamin  1 tablet Oral QHS  . piperacillin-tazobactam (ZOSYN)  IV  2.25 g Intravenous Q8H  . sevelamer carbonate  1,600 mg Oral TID WC  . silver sulfADIAZINE   Topical BID  . vancomycin  750 mg Intravenous Q M,W,F-HD  . warfarin   Does not apply Once  . Warfarin - Pharmacist Dosing Inpatient   Does not apply [phone]

## 2013-09-18 NOTE — Progress Notes (Signed)
Patient Name: [name] Date of Encounter: 09/18/2013     Principal Problem:   Atrial fibrillation with RVR Active Problems:   HYPERLIPIDEMIA   HYPERTENSION   PVD (peripheral vascular disease)   Cardiomyopathy, ischemic   DM (diabetes mellitus), secondary, w/peripheral vascular complications   ESRD (end stage renal disease) on dialysis   Delayed surgical wound healing of below-the-knee amputation stump    SUBJECTIVE  The patient is feeling well.  She is remaining in normal sinus rhythm.  Her INR is supratherapeutic at 3.15.  Blood pressure remains soft.  CURRENT MEDS . amiodarone  400 mg Oral BID  . aspirin EC  81 mg Oral Daily  . atorvastatin  10 mg Oral QAC breakfast  . B-complex with vitamin C  1 tablet Oral Daily  . cinacalcet  60 mg Oral Q breakfast  . feeding supplement (RESOURCE BREEZE)  1 Container Oral TID BM  . insulin aspart  0-15 Units Subcutaneous TID WC  . insulin aspart  3 Units Subcutaneous TID WC  . midodrine  10 mg Oral TID WC  . multivitamin  1 tablet Oral QHS  . piperacillin-tazobactam (ZOSYN)  IV  2.25 g Intravenous Q8H  . sevelamer carbonate  1,600 mg Oral TID WC  . silver sulfADIAZINE   Topical BID  . vancomycin  750 mg Intravenous Q M,W,F-HD  . warfarin   Does not apply Once  . Warfarin - Pharmacist Dosing Inpatient   Does not apply q1800    OBJECTIVE  Filed Vitals:   09/18/13 0020 09/18/13 0025 09/18/13 0200 09/18/13 0452  BP: 111/59 95/49 97/60  102/63  Pulse: 78 79 81 82  Temp: 97.8 F (36.6 C)  98.5 F (36.9 C) 98.2 F (36.8 C)  TempSrc: Oral  Oral Oral  Resp: 15 16 16 18   Height:      Weight: 182 lb 12.2 oz (82.9 kg)     SpO2: 96%  95% 94%    Intake/Output Summary (Last 24 hours) at 09/18/13 1139 Last data filed at 09/18/13 0730  Gross per 24 hour  Intake    600 ml  Output   1872 ml  Net  -1272 ml   Filed Weights   09/16/13 2355 09/17/13 2012 09/18/13 0020  Weight: 186 lb 11.2 oz (84.687 kg) 188 lb 7.9 oz (85.5 kg)  182 lb 12.2 oz (82.9 kg)    PHYSICAL EXAM  General: Pleasant, NAD.  Neuro: Alert and oriented X 3. Moves all extremities spontaneously.  Psych: Normal affect.  HEENT: Normal  Neck: Supple without bruits or JVD.  Lungs: Resp regular and unlabored, CTA.  Heart: RRR no s3, s4, or murmurs.  Abdomen: Soft, non-tender, non-distended, BS + x 4.  Extremities: BKA on the right, AKA on the left.   Accessory Clinical Findings  CBC  Recent Labs  09/17/13 0515 09/18/13 0400  WBC 6.5 7.6  HGB 14.5 13.9  HCT 45.3 42.7  MCV 85.3 84.6  PLT 113* 761*   Basic Metabolic Panel  Recent Labs  09/16/13 0400 09/17/13 2152  NA 138 134*  K 4.2 4.9  CL 99 95*  CO2 28 23  GLUCOSE 68* 97  BUN 17 27*  CREATININE 3.53* 4.99*  CALCIUM 7.4* 7.4*  PHOS  --  4.6   Liver Function Tests  Recent Labs  09/17/13 2152  ALBUMIN 1.6*   No results found for this basename: LIPASE, AMYLASE,  in the last 72 hours Cardiac Enzymes No results found for this basename: CKTOTAL,  CKMB, CKMBINDEX, TROPONINI,  in the last 72 hours BNP No components found with this basename: POCBNP,  D-Dimer No results found for this basename: DDIMER,  in the last 72 hours Hemoglobin A1C No results found for this basename: HGBA1C,  in the last 72 hours Fasting Lipid Panel No results found for this basename: CHOL, HDL, LDLCALC, TRIG, CHOLHDL, LDLDIRECT,  in the last 72 hours Thyroid Function Tests No results found for this basename: TSH, T4TOTAL, FREET3, T3FREE, THYROIDAB,  in the last 72 hours  TELE  Telemetry shows normal sinus rhythm.  ECG    Radiology/Studies  Dg Chest 2 View  09/08/2013   CLINICAL DATA:  Chest pain, shortness of breath, history coronary artery disease post CABG, diabetes, ischemic cardiomyopathy, on CPAP at night  EXAM: CHEST  2 VIEW  COMPARISON:  02/13/2013  FINDINGS: Enlargement of cardiac silhouette post CABG.  Slight pulmonary vascular congestion.  Mediastinal contours normal.   Peribronchial thickening with right basilar infiltrate question pneumonia.  Slight chronic accentuation of perihilar markings little changed from previous study.  No gross pleural effusion or pneumothorax.  No acute osseous findings.  IMPRESSION: Enlargement of cardiac silhouette with pulmonary vascular congestion post CABG.  Bronchitic changes with right lower lobe infiltrate question pneumonia.   Electronically Signed   By: Lavonia Dana M.D.   On: 09/08/2013 15:48   Dg Chest Port 1 View  09/16/2013   CLINICAL DATA:  Assess airspace disease.  EXAM: PORTABLE CHEST - 1 VIEW  COMPARISON:  09/15/2013  FINDINGS: Left jugular central line tip is probably at the junction of the SVC and azygos vein. Central line positioning has not significantly changed. There is improved aeration of the lungs suggesting decreased interstitial edema. Heart size is stable. Patient has median sternotomy wires. No evidence for a pneumothorax.  IMPRESSION: Decreasing interstitial edema.  Central line placement as described.   Electronically Signed   By: Markus Daft M.D.   On: 09/16/2013 08:17   Dg Chest Port 1 View  09/16/2013   CLINICAL DATA:  Edema.  EXAM: PORTABLE CHEST - 1 VIEW  COMPARISON:  09/13/2013.  FINDINGS: The left IJ central venous catheter remains directed into the azygos vein. The heart size and mediastinal contours are stable status post CABG. Pulmonary edema and basilar aeration have improved. There is no pneumothorax or significant pleural effusion.  IMPRESSION: Improvement in pulmonary edema and bibasilar aeration. No pneumothorax.   Electronically Signed   By: Camie Patience M.D.   On: 09/16/2013 07:04   Dg Chest Port 1 View  09/13/2013   CLINICAL DATA:  Central venous line  EXAM: PORTABLE CHEST - 1 VIEW  COMPARISON:  Prior radiograph from 09/12/2013  FINDINGS: There has been interval placement of a left subclavian central venous catheter with tip overlying the proximal -mid right atrium. No pneumothorax.  Cardiomegaly  with sequelae of prior CABG again noted. There has been interval worsening of pulmonary edema as compared to the prior examination, greatest within the right hemi thorax. Superimposed infiltrate is not excluded. Small bilateral pleural effusions suspected.  No acute osseous abnormality.  IMPRESSION: 1. Interval placement of left subclavian central venous catheter with tip overlying the proximal -mid right atrium. 2. Interval worsening of pulmonary edema. Suspect small bilateral pleural effusions.   Electronically Signed   By: Jeannine Boga M.D.   On: 09/13/2013 06:05   Dg Chest Port 1 View  09/12/2013   CLINICAL DATA:  Diabetes. Ischemic cardiomyopathy. Coronary artery disease. Fever.  EXAM: PORTABLE CHEST -  1 VIEW  COMPARISON:  09/08/2013  FINDINGS: Prior median sternotomy. Mildly degraded exam due to AP portable technique and patient body habitus. Midline trachea. Cardiomegaly accentuated by AP portable technique. Mild right hemidiaphragm elevation. No right and no definite left pleural effusion. Patchy left greater than right bibasilar airspace disease.  IMPRESSION: Cardiomegaly and low lung volumes.  Patchy bibasilar opacities. Favor atelectasis. Difficult to exclude early infection or aspiration. If PA and lateral radiographs are possible, they should be considered.   Electronically Signed   By: Abigail Miyamoto M.D.   On: 09/12/2013 21:36   Dg Chest Port 1v Same Day  09/13/2013   CLINICAL DATA:  Followup pulmonary edema  EXAM: PORTABLE CHEST - 1 VIEW SAME DAY  COMPARISON:  0446 hr chest radiograph earlier today  FINDINGS: Mild to moderate cardiac enlargement in patient who is status post CABG. Limited inspiratory effect.  There is vascular congestion with mild to moderate diffuse interstitial prominence. The degree of interstitial prominence is mildly improved.  Left central line position has changed. The left central line appears to have been slightly retracted, with tip now curving cranially  projecting over the superior vena cava/ brachiocephalic vein on the right.  IMPRESSION: Congestive heart failure with mildly improved pulmonary edema.  See above description of change in position of left subclavian central line, which appears to have been retracted, with line now curving cranially and tip projecting over the superior vena cava/ right brachiocephalic region.   Electronically Signed   By: Skipper Cliche M.D.   On: 09/13/2013 17:11    ASSESSMENT AND PLAN 1. Atrial fib maintaining NSR on amiodarone.  We'll decrease amiodarone down to 200 mg twice a day at discharge. 2. Mild hypotension, asymptomatic.blood pressure is too low to add beta blocker at this point.  3. ESRD  4. PVD  From a cardiac standpoint okay for discharge today.  She will need followup in the [facility] at [facility] [employer] office Coumadin clinic later this week.  She will need office visit follow up with Dr. Daneen Schick in several weeks.  Signed, Darlin Coco MD

## 2013-09-19 ENCOUNTER — Telehealth: Payer: Self-pay | Admitting: Interventional Cardiology

## 2013-09-19 LAB — CULTURE, BLOOD (ROUTINE X 2)
Culture: NO GROWTH
Culture: NO GROWTH

## 2013-09-19 NOTE — Telephone Encounter (Signed)
New Message  Pt called.. Requests to stop taking coumadin.[name] Please call back to assist//SR

## 2013-09-19 NOTE — Telephone Encounter (Signed)
2nd attempt called pt. pt aware of appt with coumadin clinic for 09/20/13 @3 :15.pt aware of post hosp f/u with S.Weaver ,PA on 09/27/13@11 :50. pt verbalized understanding.

## 2013-09-19 NOTE — Telephone Encounter (Signed)
lmom for pt to return call.called to give pt coumadin clinic appt time, appt 09/20/13 @3 :15. pt needs a post hosp appt.appt to be made with Scott W.,PA.09/27/13

## 2013-09-20 ENCOUNTER — Encounter (HOSPITAL_BASED_OUTPATIENT_CLINIC_OR_DEPARTMENT_OTHER): Payer: Medicare Other | Attending: Internal Medicine

## 2013-09-20 ENCOUNTER — Ambulatory Visit (INDEPENDENT_AMBULATORY_CARE_PROVIDER_SITE_OTHER): Payer: Medicare Other

## 2013-09-20 DIAGNOSIS — I6350 Cerebral infarction due to unspecified occlusion or stenosis of unspecified cerebral artery: Secondary | ICD-10-CM

## 2013-09-20 DIAGNOSIS — I4891 Unspecified atrial fibrillation: Secondary | ICD-10-CM

## 2013-09-20 DIAGNOSIS — I639 Cerebral infarction, unspecified: Secondary | ICD-10-CM

## 2013-09-20 DIAGNOSIS — E1169 Type 2 diabetes mellitus with other specified complication: Secondary | ICD-10-CM | POA: Insufficient documentation

## 2013-09-20 DIAGNOSIS — Z5181 Encounter for therapeutic drug level monitoring: Secondary | ICD-10-CM

## 2013-09-20 DIAGNOSIS — L97809 Non-pressure chronic ulcer of other part of unspecified lower leg with unspecified severity: Secondary | ICD-10-CM | POA: Insufficient documentation

## 2013-09-20 DIAGNOSIS — Z7901 Long term (current) use of anticoagulants: Secondary | ICD-10-CM

## 2013-09-20 LAB — POCT INR: INR: 1.4

## 2013-09-20 MED ORDER — WARFARIN SODIUM 1 MG PO TABS: ORAL_TABLET | ORAL | Status: DC

## 2013-09-20 NOTE — Patient Instructions (Signed)

## 2013-09-24 ENCOUNTER — Ambulatory Visit (INDEPENDENT_AMBULATORY_CARE_PROVIDER_SITE_OTHER): Payer: Medicare Other | Admitting: Pharmacist

## 2013-09-24 DIAGNOSIS — I6350 Cerebral infarction due to unspecified occlusion or stenosis of unspecified cerebral artery: Secondary | ICD-10-CM

## 2013-09-24 DIAGNOSIS — I639 Cerebral infarction, unspecified: Secondary | ICD-10-CM

## 2013-09-24 DIAGNOSIS — Z7901 Long term (current) use of anticoagulants: Secondary | ICD-10-CM

## 2013-09-24 DIAGNOSIS — Z5181 Encounter for therapeutic drug level monitoring: Secondary | ICD-10-CM

## 2013-09-24 DIAGNOSIS — I4891 Unspecified atrial fibrillation: Secondary | ICD-10-CM

## 2013-09-24 LAB — POCT INR: INR: 1.3

## 2013-09-27 ENCOUNTER — Ambulatory Visit (INDEPENDENT_AMBULATORY_CARE_PROVIDER_SITE_OTHER): Payer: Medicare Other | Admitting: *Deleted

## 2013-09-27 ENCOUNTER — Encounter: Payer: Self-pay | Admitting: Physician Assistant

## 2013-09-27 ENCOUNTER — Ambulatory Visit (INDEPENDENT_AMBULATORY_CARE_PROVIDER_SITE_OTHER): Payer: Medicare Other | Admitting: Physician Assistant

## 2013-09-27 VITALS — BP 106/68 | HR 84 | Ht 64.0 in

## 2013-09-27 DIAGNOSIS — I639 Cerebral infarction, unspecified: Secondary | ICD-10-CM

## 2013-09-27 DIAGNOSIS — Z7901 Long term (current) use of anticoagulants: Secondary | ICD-10-CM

## 2013-09-27 DIAGNOSIS — Z992 Dependence on renal dialysis: Secondary | ICD-10-CM

## 2013-09-27 DIAGNOSIS — I255 Ischemic cardiomyopathy: Secondary | ICD-10-CM

## 2013-09-27 DIAGNOSIS — I4891 Unspecified atrial fibrillation: Secondary | ICD-10-CM

## 2013-09-27 DIAGNOSIS — I2510 Atherosclerotic heart disease of native coronary artery without angina pectoris: Secondary | ICD-10-CM

## 2013-09-27 DIAGNOSIS — I739 Peripheral vascular disease, unspecified: Secondary | ICD-10-CM

## 2013-09-27 DIAGNOSIS — I2589 Other forms of chronic ischemic heart disease: Secondary | ICD-10-CM

## 2013-09-27 DIAGNOSIS — E785 Hyperlipidemia, unspecified: Secondary | ICD-10-CM

## 2013-09-27 DIAGNOSIS — I6350 Cerebral infarction due to unspecified occlusion or stenosis of unspecified cerebral artery: Secondary | ICD-10-CM

## 2013-09-27 DIAGNOSIS — I10 Essential (primary) hypertension: Secondary | ICD-10-CM

## 2013-09-27 DIAGNOSIS — N186 End stage renal disease: Secondary | ICD-10-CM

## 2013-09-27 DIAGNOSIS — Z79899 Other long term (current) drug therapy: Secondary | ICD-10-CM

## 2013-09-27 DIAGNOSIS — I5022 Chronic systolic (congestive) heart failure: Secondary | ICD-10-CM

## 2013-09-27 LAB — POCT INR: INR: 1.2

## 2013-09-27 MED ORDER — AMIODARONE HCL 200 MG PO TABS: 200.0000 mg | ORAL_TABLET | Freq: Every day | ORAL | Status: DC

## 2013-09-27 NOTE — Progress Notes (Signed)
[address] [place],   [address] Phone: [phone] Fax:  [phone]  Date:  09/27/2013   ID:  [name], DOB [birth year], MRN [id number]  PCP:  Placido Sou, MD  Cardiologist:  Dr. Daneen Schick     History of Present Illness: [name] is a 58 y.o. female with a history of CAD, status post CABG in 2011, ischemic cardiomyopathy (EF 40-45%), ESRD (status post failed transplant in 2006), T2DM, HTN, HL, PAD, status post left femoral to popliteal bypass in 02/2013 and subsequent left AKA, s/p right BKA, SLE.  Lexiscan Myoview (02/2013): Intermediate risk study; EF 50%, basal inferolateral, basal anterolateral and mid inferolateral moderate ischemia; basal anterior, basal anteroseptal and mid anterior scar. Patient had a type II non-STEMI during an admission in 02/2013 for lower extremity revascularization surgery. Last seen by Dr. Tamala Julian in the hospital in 02/2013.  Patient was recently admitted 12/27-1/6 with chest pain and atrial fibrillation with RVR.  Cardiac markers remained normal. Chest pain was felt to be atypical.  Echocardiogram (09/01/2013): EF 30-35%, diffuse hypokinesis, MAC, mild MR, mild LAE, mild RVE, severely reduced RVSF, moderate RAE, mildly increased PASP.  Patient subsequently underwent TEE guided cardioversion 09/11/2013. However, she developed recurrent atrial fibrillation. Her blood pressure was soft and rate controlling medications were difficult to titrate. She was placed on amiodarone. She did convert to sinus rhythm and maintained NSR at discharge. She was also placed on Coumadin. Of note, her hospitalization was complicated by healthcare associated pneumonia and she was treated with antibiotics.  She returns for follow up. She is here by herself. She arrives in a wheelchair. She denies any further chest pain. She denies palpitations. She denies significant dyspnea. She denies orthopnea or PND. She denies increased abdominal girth. She denies  syncope or dizziness.  Recent Labs: 02/13/2013: HDL Cholesterol 25*; LDL (calc) 77; TSH 1.255  09/08/2013: Pro B Natriuretic peptide (BNP) 32871.0*  09/15/2013: ALT 31  09/17/2013: Creatinine 4.99*; Potassium 4.9  09/18/2013: Hemoglobin 13.9   Wt Readings from Last 3 Encounters:  09/18/13 182 lb 12.2 oz (82.9 kg)  09/18/13 182 lb 12.2 oz (82.9 kg)  08/22/13 181 lb (82.101 kg)     Past Medical History  Diagnosis Date  . Diabetes mellitus, type 2   . Secondary hyperparathyroidism   . OSA (obstructive sleep apnea)     NPSG 11.23.11-AHI 64.9/hr-central and obstructive apnea  . Ischemic cardiomyopathy     a. EF 25-30% 2011;  b.  Echocardiogram (09/01/2013): EF 30-35%, diffuse hypokinesis, MAC, mild MR, mild LAE, mild RVE, severely reduced RVSF, moderate RAE, mildly increased PASP.  [name] Pulmonary hypertension     Est PAsyst 60-Dr Linard Millers, off coumadin, CT neg for PE  . Hyperlipidemia   . Anemia   . Thyroid disease   . Pulmonary edema   . Peripheral vascular disease     a. Prior LE amputations. b. s/p prior LE bypass surgery.  . Sebaceous cyst   . 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 Richardson Dopp  . Shortness of breath     with exertion  . Hemodialysis patient     M,W,F  . TIA (transient ischemic attack) June 2014  . Ulcer of heel due to diabetes     left  . Kidney disease Dialysis MWF    ESRD secondary to DM, failed transplant 2006. Dialysis  . Atrial fibrillation     a. Dx 08/2013, placed on amiodarone.  . Chronic systolic CHF (congestive heart  failure)   . CAD (coronary artery disease)     a. s/p CABG 2011.;  b. Lexiscan Myoview (02/2013): Intermediate risk study; EF 50%, basal inferolateral, basal anterolateral and mid inferolateral moderate ischemia; basal anterior, basal anteroseptal and mid anterior scar;  c. Type 2 NSTEMI during admission for LE revasc surgery 02/2013 in setting of SVT    Current Outpatient Prescriptions  Medication Sig Dispense Refill  . acetaminophen  (TYLENOL) 325 MG tablet Take 650 mg by mouth every 6 (six) hours as needed for pain.       [name] amiodarone (PACERONE) 200 MG tablet Take 1 tablet (200 mg total) by mouth 2 (two) times daily.  60 tablet  0  . aspirin EC 81 MG tablet Take 1 tablet (81 mg total) by mouth daily.  30 tablet  0  . atorvastatin (LIPITOR) 10 MG tablet Take 10 mg by mouth daily before breakfast.       . B Complex-C (B-COMPLEX WITH VITAMIN C) tablet Take 1 tablet by mouth daily.      . B Complex-C-Folic Acid (DIALYVITE PO) Take by mouth daily.      . cinacalcet (SENSIPAR) 60 MG tablet Take 60 mg by mouth daily.       . feeding supplement, RESOURCE BREEZE, (RESOURCE BREEZE) LIQD Take 1 Container by mouth 3 (three) times daily between meals.  90 Container  0  . hydrOXYzine (ATARAX/VISTARIL) 25 MG tablet Take 25 mg by mouth every 4 (four) hours as needed.       . insulin aspart (NOVOLOG) 100 UNIT/ML injection Inject 3-15 Units into the skin 3 (three) times daily before meals. Sliding scale      . midodrine (PROAMATINE) 10 MG tablet Take 10 mg by mouth every Monday, Wednesday, and Friday.       . pregabalin (LYRICA) 25 MG capsule Take 25 mg by mouth 2 (two) times daily.      [name] SANTYL ointment 1 application daily.       . sevelamer carbonate (RENVELA) 800 MG tablet Take 1,600 mg by mouth 3 (three) times daily with meals.      . silver sulfADIAZINE (SILVADENE) 1 % cream Apply topically 2 (two) times daily.  50 g  0  . warfarin (COUMADIN) 1 MG tablet Take as directed by anticoagulation clinic  30 tablet  1   No current facility-administered medications for this visit.    Allergies:   Neurontin; Ezetimibe-simvastatin; Klonopin; and Sertraline hcl   Social History:  The patient  reports that she has never smoked. She has never used smokeless tobacco. She reports that she does not drink alcohol or use illicit drugs.   Family History:  The patient's family history includes Cerebral aneurysm in her mother; Edema in her father; Heart  disease in her brother and another family member; Stroke in her brother.   ROS:  Please see the history of present illness.   She denies any bleeding problems.   All other systems reviewed and negative.   PHYSICAL EXAM: VS:  BP 106/68  Pulse 84  Ht 5\' 4"  (1.626 m) Well nourished, well developed, in no acute distress HEENT: normal Neck:  + JVDat 90 Cardiac:  distant S1, S2; RRR; no murmur Lungs:  Decreased breath sounds bilaterally, no wheezing, rhonchi or rales Abd: soft, nontender, no hepatomegaly Ext: 1+ edema right thigh; trace to 1+ edema left thigh Skin: warm and dry Neuro:  CNs 2-12 intact, no focal abnormalities noted  EKG:  NSR, HR 84, low  voltage, first degree AV block, right axis deviation, nonspecific ST-T wave changes     ASSESSMENT AND PLAN:  1. Atrial Fibrillation:  Maintaining NSR.  Continue Amiodarone.  Decrease Amiodarone to 200 QD.  She is tolerating coumadin.  Continue this.  Arrange follow up LFTs and TSH in 6 weeks.  2. Chronic Systolic CHF: Volume management per dialysis. 3. Ischemic Cardiomyopathy: She is not on beta blocker or ACE/ARB therapy due to hypotension. 4. CAD: No angina. Continue aspirin statin. 5. Hypertension: Controlled. 6. Hyperlipidemia: Continue statin. 7. Peripheral Arterial Disease: Continue followup with vascular surgery as planned. 8. ESRD: Continue follow up with nephrology and hemodialysis as scheduled. 9. Disposition: Follow up with Dr. Tamala Julian in 6 weeks.  Signed, Richardson Dopp, PA-C  09/27/2013 12:34 PM

## 2013-09-27 NOTE — Patient Instructions (Signed)
YOU HAVE A FOLLOW UP WITH DR. Tamala Julian ON 11/15/13 @ 2 PM  LAB WORK 11/15/13 TSH, LFT  DECREASE AMIODARONE TO 200 MG DAILY

## 2013-10-01 ENCOUNTER — Telehealth: Payer: Self-pay

## 2013-10-01 NOTE — Telephone Encounter (Signed)
received medaical neccissity rqst from [facility] gor pt elevating leg rest ,limb support system.called and spoke with Larena Glassman adv her rqst should be fwd to pt pcp.she verbalized understanding.

## 2013-10-04 ENCOUNTER — Ambulatory Visit (INDEPENDENT_AMBULATORY_CARE_PROVIDER_SITE_OTHER): Payer: Medicare Other | Admitting: Pharmacist

## 2013-10-04 DIAGNOSIS — Z7901 Long term (current) use of anticoagulants: Secondary | ICD-10-CM

## 2013-10-04 DIAGNOSIS — I639 Cerebral infarction, unspecified: Secondary | ICD-10-CM

## 2013-10-04 DIAGNOSIS — I6350 Cerebral infarction due to unspecified occlusion or stenosis of unspecified cerebral artery: Secondary | ICD-10-CM

## 2013-10-04 DIAGNOSIS — I4891 Unspecified atrial fibrillation: Secondary | ICD-10-CM

## 2013-10-04 LAB — POCT INR: INR: 1.2

## 2013-10-10 ENCOUNTER — Encounter (HOSPITAL_COMMUNITY): Payer: Medicare Other

## 2013-10-10 ENCOUNTER — Ambulatory Visit: Payer: Medicare Other | Admitting: Vascular Surgery

## 2013-10-11 ENCOUNTER — Ambulatory Visit (INDEPENDENT_AMBULATORY_CARE_PROVIDER_SITE_OTHER): Payer: Medicare Other | Admitting: Pharmacist

## 2013-10-11 DIAGNOSIS — Z7901 Long term (current) use of anticoagulants: Secondary | ICD-10-CM

## 2013-10-11 DIAGNOSIS — I639 Cerebral infarction, unspecified: Secondary | ICD-10-CM

## 2013-10-11 DIAGNOSIS — I6350 Cerebral infarction due to unspecified occlusion or stenosis of unspecified cerebral artery: Secondary | ICD-10-CM

## 2013-10-11 DIAGNOSIS — I4891 Unspecified atrial fibrillation: Secondary | ICD-10-CM

## 2013-10-11 LAB — POCT INR: INR: 1.2

## 2013-10-12 ENCOUNTER — Telehealth: Payer: Self-pay | Admitting: *Deleted

## 2013-10-12 ENCOUNTER — Encounter: Payer: Self-pay | Admitting: Vascular Surgery

## 2013-10-12 ENCOUNTER — Other Ambulatory Visit: Payer: Self-pay | Admitting: *Deleted

## 2013-10-12 DIAGNOSIS — T82898A Other specified complication of vascular prosthetic devices, implants and grafts, initial encounter: Secondary | ICD-10-CM

## 2013-10-12 MED ORDER — WARFARIN SODIUM 1 MG PO TABS: ORAL_TABLET | ORAL | Status: DC

## 2013-10-12 NOTE — Telephone Encounter (Signed)
Patient requests coumadin refill to be sent to [facility] on [address]. Thanks, MI

## 2013-10-18 ENCOUNTER — Ambulatory Visit (INDEPENDENT_AMBULATORY_CARE_PROVIDER_SITE_OTHER): Payer: Medicare Other | Admitting: *Deleted

## 2013-10-18 ENCOUNTER — Encounter (HOSPITAL_BASED_OUTPATIENT_CLINIC_OR_DEPARTMENT_OTHER): Payer: Medicare Other | Attending: Internal Medicine

## 2013-10-18 DIAGNOSIS — E1169 Type 2 diabetes mellitus with other specified complication: Secondary | ICD-10-CM | POA: Insufficient documentation

## 2013-10-18 DIAGNOSIS — I639 Cerebral infarction, unspecified: Secondary | ICD-10-CM

## 2013-10-18 DIAGNOSIS — I6350 Cerebral infarction due to unspecified occlusion or stenosis of unspecified cerebral artery: Secondary | ICD-10-CM

## 2013-10-18 DIAGNOSIS — I4891 Unspecified atrial fibrillation: Secondary | ICD-10-CM

## 2013-10-18 DIAGNOSIS — L97809 Non-pressure chronic ulcer of other part of unspecified lower leg with unspecified severity: Secondary | ICD-10-CM | POA: Insufficient documentation

## 2013-10-18 DIAGNOSIS — Z5181 Encounter for therapeutic drug level monitoring: Secondary | ICD-10-CM | POA: Insufficient documentation

## 2013-10-18 DIAGNOSIS — S88119A Complete traumatic amputation at level between knee and ankle, unspecified lower leg, initial encounter: Secondary | ICD-10-CM | POA: Insufficient documentation

## 2013-10-18 DIAGNOSIS — Z7901 Long term (current) use of anticoagulants: Secondary | ICD-10-CM

## 2013-10-18 LAB — POCT INR: INR: 1.2

## 2013-10-22 ENCOUNTER — Encounter: Payer: Self-pay | Admitting: Vascular Surgery

## 2013-10-23 ENCOUNTER — Ambulatory Visit (HOSPITAL_COMMUNITY)
Admission: RE | Admit: 2013-10-23 | Discharge: 2013-10-23 | Disposition: A | Discharge status: Home / Self Care | Payer: Medicare Other | Source: Ambulatory Visit | Attending: Vascular Surgery | Admitting: Vascular Surgery

## 2013-10-23 ENCOUNTER — Ambulatory Visit (INDEPENDENT_AMBULATORY_CARE_PROVIDER_SITE_OTHER): Payer: Medicare Other | Admitting: Vascular Surgery

## 2013-10-23 ENCOUNTER — Encounter: Payer: Self-pay | Admitting: Vascular Surgery

## 2013-10-23 VITALS — BP 123/65 | HR 81 | Ht 64.0 in | Wt 195.1 lb

## 2013-10-23 DIAGNOSIS — E119 Type 2 diabetes mellitus without complications: Secondary | ICD-10-CM | POA: Insufficient documentation

## 2013-10-23 DIAGNOSIS — T82898A Other specified complication of vascular prosthetic devices, implants and grafts, initial encounter: Secondary | ICD-10-CM

## 2013-10-23 DIAGNOSIS — E785 Hyperlipidemia, unspecified: Secondary | ICD-10-CM | POA: Insufficient documentation

## 2013-10-23 DIAGNOSIS — L98499 Non-pressure chronic ulcer of skin of other sites with unspecified severity: Secondary | ICD-10-CM | POA: Insufficient documentation

## 2013-10-23 NOTE — Progress Notes (Signed)
Established Dialysis Access  History of Present Illness  [name] is a 58 y.o. (08/05/1956) female who presents for re-evaluation for permanent access with signs of steal syndrome for the past 4 weeks.  She developed fissures on the thumb and pin point wound on the medial pointer finger.  She has applied lotion to the areas, but her thumb is becoming painful and her hand has decreased sensation.    Past Medical History  Diagnosis Date  . Diabetes mellitus, type 2   . Secondary hyperparathyroidism   . OSA (obstructive sleep apnea)     NPSG 11.23.11-AHI 64.9/hr-central and obstructive apnea  . Ischemic cardiomyopathy     a. EF 25-30% 2011;  b.  Echocardiogram (09/01/2013): EF 30-35%, diffuse hypokinesis, MAC, mild MR, mild LAE, mild RVE, severely reduced RVSF, moderate RAE, mildly increased PASP.  [name] Pulmonary hypertension     Est PAsyst 60-Dr Linard Millers, off coumadin, CT neg for PE  . Hyperlipidemia   . Anemia   . Thyroid disease   . Pulmonary edema   . Peripheral vascular disease     a. Prior LE amputations. b. s/p prior LE bypass surgery.  . Sebaceous cyst   . 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 Richardson Dopp  . Shortness of breath     with exertion  . Hemodialysis patient     M,W,F  . TIA (transient ischemic attack) June 2014  . Ulcer of heel due to diabetes     left  . Kidney disease Dialysis MWF    ESRD secondary to DM, failed transplant 2006. Dialysis  . Atrial fibrillation     a. Dx 08/2013, placed on amiodarone.  . Chronic systolic CHF (congestive heart failure)   . CAD (coronary artery disease)     a. s/p CABG 2011.;  b. Lexiscan Myoview (02/2013): Intermediate risk study; EF 50%, basal inferolateral, basal anterolateral and mid inferolateral moderate ischemia; basal anterior, basal anteroseptal and mid anterior scar;  c. Type 2 NSTEMI during admission for LE revasc surgery 02/2013 in setting of SVT   Review of Systems  Constitutional: Negative for fever and  chills.  HENT: Negative for congestion.   Eyes: Negative for double vision.  Respiratory: Negative for cough.   Cardiovascular: Negative for chest pain and palpitations.  Gastrointestinal: Negative for nausea and vomiting.  Neurological: Positive for sensory change. Negative for headaches.     Physical Examination  Filed Vitals:   10/23/13 1502  BP: 123/65  Pulse: 81  Height: 5\' 4"  (1.626 m)  Weight: 195 lb 1.7 oz (88.5 kg)  SpO2: 99%   Body mass index is 33.47 kg/(m^2).  General: A&O x 3, WD Pulmonary: Lungs CTA  Cardiac: RRR,  Vascular: Vessel Right Left  Radial Weak Palpable Palpable  Ulnar Palpable Palpable  Brachial Palpable Palpable   The right radial artery pulse  Is stronger with compression of the upper arm fistula  Gastrointestinal: soft, NTND  Musculoskeletal: Grip strength 4/5 right left hand 5/5  Neurologic:Sensation to touch left > right upper extremity  Right thumb tip wound with skin pallor, no drainage. Pointer medial finger pin point superficial wound. Nail bed pallor   Non-Invasive Vascular Imaging  Doppler Resting BP 110 mm hg compression of fistula creates BP 157 mm hg.    Medical Decision Making  [name] is a 58 y.o. female who presents with Sever steal syndrome with evidence of tissue loss over the last 4 weeks.  ESRD requiring hemodialysis. She dialyzes  M W F , Dr. Early would like to perform surgery 11/07/2013 which is a Wednesday if we can move her dialysis schedule that week to accommodate this it would be helpful.   He would per form a "DRIL" procedure Distal revascularization interval ligation of the right upper arm AV Fistula. Newell Wafer MAUREEN PA-C  [facility] Office: [phone]   10/23/2013, 3:32 PM I have examined the patient, reviewed and agree with above. Very difficult problem. Does have an ischemic-looking ulcerations over her thumb and index finger which are painful in  her. She is having worsening numbness as well. This does appear to be related to steel phenomenon. I discussed options with the patient and have recommended a DRIL procedure to hopefully maintain patency of her fistula and also correct her steal. We will schedule this for 11/07/2013. She typically dialyzes on Monday Wednesday and Friday we will assess dialysis center to switch her dialysis to Monday Tuesday Friday of this week.  EARLY, TODD, MD 10/23/2013 4:19 PM  

## 2013-10-25 ENCOUNTER — Encounter (HOSPITAL_COMMUNITY): Payer: Self-pay | Admitting: Pharmacist

## 2013-10-25 ENCOUNTER — Ambulatory Visit (INDEPENDENT_AMBULATORY_CARE_PROVIDER_SITE_OTHER): Payer: Medicare Other | Admitting: Pharmacist

## 2013-10-25 DIAGNOSIS — Z5181 Encounter for therapeutic drug level monitoring: Secondary | ICD-10-CM

## 2013-10-25 DIAGNOSIS — I639 Cerebral infarction, unspecified: Secondary | ICD-10-CM

## 2013-10-25 DIAGNOSIS — Z7901 Long term (current) use of anticoagulants: Secondary | ICD-10-CM

## 2013-10-25 DIAGNOSIS — I6350 Cerebral infarction due to unspecified occlusion or stenosis of unspecified cerebral artery: Secondary | ICD-10-CM

## 2013-10-25 DIAGNOSIS — I4891 Unspecified atrial fibrillation: Secondary | ICD-10-CM

## 2013-10-25 LAB — POCT INR: INR: 1.2

## 2013-10-29 ENCOUNTER — Telehealth: Payer: Self-pay | Admitting: Interventional Cardiology

## 2013-10-29 NOTE — Telephone Encounter (Signed)
We need to set-up for colonoscopy and she needs bridging due to prior CVA and H/O AFIB. Please help manage.

## 2013-10-30 ENCOUNTER — Ambulatory Visit: Payer: Self-pay | Admitting: Neurology

## 2013-10-30 ENCOUNTER — Other Ambulatory Visit: Payer: Self-pay | Admitting: *Deleted

## 2013-10-31 ENCOUNTER — Encounter: Payer: Self-pay | Admitting: Neurology

## 2013-10-31 ENCOUNTER — Ambulatory Visit (INDEPENDENT_AMBULATORY_CARE_PROVIDER_SITE_OTHER): Payer: Medicare Other | Admitting: Neurology

## 2013-10-31 VITALS — BP 128/77 | HR 85

## 2013-10-31 DIAGNOSIS — E1142 Type 2 diabetes mellitus with diabetic polyneuropathy: Secondary | ICD-10-CM

## 2013-10-31 DIAGNOSIS — I2510 Atherosclerotic heart disease of native coronary artery without angina pectoris: Secondary | ICD-10-CM

## 2013-10-31 HISTORY — DX: Type 2 diabetes mellitus with diabetic polyneuropathy: E11.42

## 2013-10-31 NOTE — Progress Notes (Signed)
Reason for visit: Peripheral neuropathy  [name] is an 58 y.o. female  History of present illness:  [name] is a 58 year old right-handed black female with a history of end-stage renal disease, currently on hemodialysis. 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 The patient has a fistula in the right arm for hemodialysis. The patient has begun to have ischemic changes involving her right thumb. The patient was told she needed to have stenting of the fistula to allow more blood flow into the right forearm to prevent further ischemic changes. The patient noted increased numbness in both hands, right greater left. The patient has a documented significant peripheral neuropathy. The patient in the past was told she had bilateral carpal tunnel syndrome, but she never had surgery for this. The patient comes to this office for further evaluation. The patient will occasionally drop things from her hands.  Past Medical History  Diagnosis Date  . Diabetes mellitus, type 2   . Secondary hyperparathyroidism   . OSA (obstructive sleep apnea)     NPSG 11.23.11-AHI 64.9/hr-central and obstructive apnea  . Ischemic cardiomyopathy     a. EF 25-30% 2011;  b.  Echocardiogram (09/01/2013): EF 30-35%, diffuse hypokinesis, MAC, mild MR, mild LAE, mild RVE, severely reduced RVSF, moderate RAE, mildly increased PASP.  [name] Pulmonary hypertension     Est PAsyst 60-Dr Linard Millers, off coumadin, CT neg for PE  . Hyperlipidemia   . Anemia   . Thyroid disease   . Pulmonary edema   . Peripheral vascular disease     a. Prior LE amputations. b. s/p prior LE bypass surgery.  . Sebaceous cyst   . 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 Richardson Dopp  . Shortness of breath     with exertion  . Hemodialysis patient     M,W,F  . TIA (transient ischemic attack) June 2014  . Ulcer of heel due to diabetes      left  . Kidney disease Dialysis MWF    ESRD secondary to DM, failed transplant 2006. Dialysis  . Atrial fibrillation     a. Dx 08/2013, placed on amiodarone.  . Chronic systolic CHF (congestive heart failure)   . CAD (coronary artery disease)     a. s/p CABG 2011.;  b. Lexiscan Myoview (02/2013): Intermediate risk study; EF 50%, basal inferolateral, basal anterolateral and mid inferolateral moderate ischemia; basal anterior, basal anteroseptal and mid anterior scar;  c. Type 2 NSTEMI during admission for LE revasc surgery 02/2013 in setting of SVT  . Polyneuropathy in diabetes(357.2) 10/31/2013  . Neuropathy     Past Surgical History  Procedure Laterality Date  . Coronary artery bypass graft  2011    3V  . Insertion of dialysis catheter    . 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 Nasal mucosal biopsy    . Ovarian cyst surgery    . 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 Laser retinal surgery  2011  . Kidney transplant      rejection  . Abdominal angiogram Left 01/25/13  . Colonoscopy    . Cardiac catheterization    . Eye surgery    . Femoral-popliteal bypass graft Left 02/20/2013    Procedure:  FEMORAL-to below knee POPLITEAL ARTERY bypasswith saphenous vein with intraoperative angiogram ;  Surgeon: Angelia Mould,  MD;  Location: [facility] OR;  Service: Vascular;  Laterality: Left;  fem-below knee pop  . Amputation Left 03/27/2013    Procedure: AMPUTATION ABOVE KNEE;  Surgeon: Angelia Mould, MD;  Location: [place];  Service: Vascular;  Laterality: Left;  . Tee without cardioversion N/A 09/11/2013    Procedure: TRANSESOPHAGEAL ECHOCARDIOGRAM (TEE);  Surgeon: Casandra Doffing, MD;  Location: [facility] ENDOSCOPY;  Service: Cardiovascular;  Laterality: N/A;  . Cardioversion N/A 09/11/2013    Procedure: CARDIOVERSION;  Surgeon: Casandra Doffing, MD;  Location: [facility] ENDOSCOPY;  Service: Cardiovascular;  Laterality: N/A;    Family History    Problem Relation Age of Onset  . Heart disease    . Edema Father     fluid overload  . Diabetes Father   . Cerebral aneurysm Mother   . Diabetes Mother   . Stroke Brother   . Heart disease Brother   . Diabetes Brother   . Hyperlipidemia Brother   . Hypertension Brother   . Peripheral vascular disease Brother     amputation    Social history:  reports that she has never smoked. She has never used smokeless tobacco. She reports that she does not drink alcohol or use illicit drugs.    Allergies  Allergen Reactions  . Neurontin [Gabapentin] Anaphylaxis  . Ezetimibe-Simvastatin Other (See Comments)    REACTION: face edema; itching  . Klonopin [Clonazepam] Other (See Comments)    hallucination  . Sertraline Hcl Other (See Comments)    hallucination    Medications:  Current Outpatient Prescriptions on File Prior to Visit  Medication Sig Dispense Refill  . acetaminophen (TYLENOL) 325 MG tablet Take 650-975 mg by mouth daily as needed (pain).       [name] amiodarone (PACERONE) 200 MG tablet Take 200 mg by mouth daily.      [name] aspirin EC 81 MG tablet Take 81 mg by mouth daily.      [name] atorvastatin (LIPITOR) 10 MG tablet Take 10 mg by mouth daily before breakfast.       . B Complex-C-Folic Acid (DIALYVITE PO) Take 1 tablet by mouth daily.       . cinacalcet (SENSIPAR) 60 MG tablet Take 60 mg by mouth daily with lunch.       . hydrOXYzine (ATARAX/VISTARIL) 25 MG tablet Take 25 mg by mouth every 6 (six) hours as needed for itching.       . insulin aspart (NOVOLOG FLEXPEN) 100 UNIT/ML FlexPen Inject 3-15 Units into the skin 3 (three) times daily with meals. Per SSI      . midodrine (PROAMATINE) 10 MG tablet Take 10 mg by mouth 2 (two) times daily.       Vladimir Faster Glycol-Propyl Glycol (SYSTANE OP) Apply 1 drop to eye 3 (three) times daily as needed (for dry eyes).      . pregabalin (LYRICA) 25 MG capsule Take 25 mg by mouth 2 (two) times daily.      [name] SANTYL ointment Apply 1 application  topically every other day. Apply to wound stump      . sevelamer carbonate (RENVELA) 800 MG tablet Take 1,600 mg by mouth 3 (three) times daily with meals.      . warfarin (COUMADIN) 1 MG tablet Take 3 mg by mouth daily.       No current facility-administered medications on file prior to visit.    ROS:  Out of a complete 14 system review of symptoms, the patient complains only of the following symptoms, and all  other reviewed systems are negative.  Appetite change Diarrhea, nausea  Blood pressure 128/77, pulse 85, weight 0 lb (0 kg).  Physical Exam  General: The patient is alert and cooperative at the time of the examination.  Neuromuscular: The patient has a right below-knee amputation, left above-knee amputation.  Skin: No significant peripheral edema is noted.   Neurologic Exam  Mental status: The patient is oriented x 3.  Cranial nerves: Facial symmetry is present. Speech is normal, no aphasia or dysarthria is noted. Extraocular movements are full. Visual fields are full.  Motor: The patient has good strength in all 4 extremities.  Sensory examination: Soft touch sensation is symmetric on the hands and face.  Coordination: The patient has good finger-nose-finger bilaterally.  Gait and station: The patient could not be ambulated.  Reflexes: Deep tendon reflexes are symmetric, but depressed in the arms.   Assessment/Plan:  1. Diabetic peripheral neuropathy  2. End-stage renal disease  3. Peripheral vascular disease  The patient likely will need some revision of the fistula in the right arm to prevent further ischemic change. The numbness in the hands is likely related to her neuropathy. In the future, the patient may benefit from treatment of carpal tunnel syndrome to help improve the numbness and function of her hands. The patient will followup if needed.  Jill Alexanders MD 10/31/2013 6:15 PM  [facility] [address] [place], [place] [address]  Phone [phone] Fax [phone]

## 2013-10-31 NOTE — Telephone Encounter (Signed)
Discussed with Zigmund Daniel with Dr. Luther Parody office last week.  They would like pt off Coumadin 3 days for her revision of fistula.  Pt's INRs have been subtherapeutic since starting Coumadin so will discuss Lovenox bridge with Dr. Tamala Julian.

## 2013-10-31 NOTE — Patient Instructions (Signed)
Diabetic Neuropathy Diabetic neuropathy is a nerve disease or nerve damage that is caused by diabetes mellitus. About half of all people with diabetes mellitus have some form of nerve damage. Nerve damage is more common in those who have had diabetes mellitus for many years and who generally have not had good control of their blood sugar (glucose) level. Diabetic neuropathy is a common complication of diabetes mellitus. There are three more common types of diabetic neuropathy and a fourth type that is less common and less understood:   Peripheral neuropathy This is the most common type of diabetic neuropathy. It causes damage to the nerves of the feet and legs first and then eventually the hands and arms.The damage affects the ability to sense touch.  Autonomic neuropathy This type causes damage to the autonomic nervous system, which controls the following functions:  Heartbeat.  Body temperature.  Blood pressure.  Urination.  Digestion.  Sweating.  Sexual function.  Focal neuropathy Focal neuropathy can be painful and unpredictable and occurs most often in older adults with diabetes mellitus. It involves a specific nerve or one area and often comes on suddenly. It usually does not cause long-term problems.  Radiculoplexus neuropathy Sometimes called lumbosacral radiculoplexus neuropathy, radiculoplexus neuropathy affects the nerves of the thighs, hips, buttocks, or legs. It is more common in people with type 2 diabetes mellitus and in older men. It is characterized by debilitating pain, weakness, and atrophy, usually in the thigh muscles. CAUSES  The cause of peripheral, autonomic, and focal neuropathies is diabetes mellitus that is uncontrolled and high glucose levels. The cause of radiculoplexus neuropathy is unknown. However, it is thought to be caused by inflammation related to uncontrolled glucose levels. SIGNS AND SYMPTOMS  Peripheral Neuropathy Peripheral neuropathy develops  slowly over time. When the nerves of the feet and legs no longer work there may be:   Burning, stabbing, or aching pain in the legs or feet.  Inability to feel pressure or pain in your feet. This can lead to:  Thick calluses over pressure areas.  Pressure sores.  Ulcers.  Foot deformities.  Reduced ability to feel temperature changes.  Muscle weakness. Autonomic Neuropathy The symptoms of autonomic neuropathy vary depending on which nerves are affected. Symptoms may include:  Problems with digestion, such as:  Feeling sick to your stomach (nausea).  Vomiting.  Bloating.  Constipation.  Diarrhea.  Abdominal pain.  Difficulty with urination. This occurs if you lose your ability to sense when your bladder is full. Problems include:  Urine leakage (incontinence).  Inability to empty your bladder completely (retention).  Rapid or irregular heartbeat (palpitations).  Blood pressure drops when you stand up (orthostatic hypotension). When you stand up you may feel:  Dizzy.  Weak.  Faint.  In men, inability to attain and maintain an erection.  In women, vaginal dryness and problems with decreased sexual desire and arousal.  Problems with body temperature regulation.  Increased or decreased sweating. Focal Neuropathy  Abnormal eye movements or abnormal alignment of both eyes.  Weakness in the wrist.  Foot drop. This results in an inability to lift the foot properly and abnormal walking or foot movement.  Paralysis on one side of your face (Bell palsy).  Chest or abdominal pain. Radiculoplexus Neuropathy  Sudden, severe pain in your hip, thigh, or buttocks.  Weakness and wasting of thigh muscles.  Difficulty rising from a seated position.  Abdominal swelling.  Unexplained weight loss (usually more than 10 lb [4.5 kg]). DIAGNOSIS  Peripheral Neuropathy   Your senses may be tested. Sensory function testing can be done with:  A light touch using a  monofilament.  A vibration with tuning fork.  A sharp sensation with a pin prick. Other tests that can help diagnose neuropathy are:  Nerve conduction velocity. This test checks the transmission of an electrical current through a nerve.  Electromyography. This shows how muscles respond to electrical signals transmitted by nearby nerves.  Quantitative sensory testing. This is used to assess how your nerves respond to vibrations and changes in temperature. Autonomic Neuropathy Diagnosis is often based on reported symptoms. Tell your health care provider if you experience:   Dizziness.   Constipation.   Diarrhea.   Inappropriate urination or inability to urinate.   Inability to get or maintain an erection.  Tests that may be done include:   Electrocardiography or Holter monitor. These are tests that can help show problems with the heart rate or heart rhythm.   An X-ray exam may be done. Focal Neuropathy Diagnosis is made based on your symptoms and what your health care provider finds during your exam. Other tests may be done. They may include:  Nerve conduction velocities. This checks the transmission of electrical current through a nerve.  Electromyography. This shows how muscles respond to electrical signals transmitted by nearby nerves.  Quantitative sensory testing. This test is used to assess how your nerves respond to vibration and changes in temperature. Radiculoplexus Neuropathy  Often the first thing is to eliminate any other issue or problems that might be the cause, as there is no stick test for diagnosis.  X-ray exam of your spine and lumbar region.  Spinal tap to rule out cancer.  MRI to rule out other lesions. TREATMENT  Once nerve damage occurs, it cannot be reversed. The goal of treatment is to keep the disease or nerve damage from getting worse and affecting more nerve fibers. Controlling your blood glucose level is the key. Most people with  radiculoplexus neuropathy see at least a partial improvement over time. You will need to keep your blood glucose and HbA1c levels in the target range determined by your health care provider. Things that help control blood glucose levels include:   Blood glucose monitoring.   Meal planning.   Physical activity.   Diabetes medicine.  Over time, maintaining lower blood glucose levels helps lessen symptoms. Sometimes, prescription pain medicine is needed. HOME CARE INSTRUCTIONS:  Do not smoke.  Keep your blood glucose level in the range that you and your health care provider have determined acceptable for you.  Keep your blood pressure level in the range that you and your health care provider have determined acceptable for you.  Eat a well-balanced diet.  Be active every day.  Check your feet every day. SEEK MEDICAL CARE IF:   You have burning, stabbing, or aching pain in the legs or feet.  You are unable to feel pressure or pain in your feet.  You develop problems with digestion such as:  Nausea.  Vomiting.  Bloating.  Constipation.  Diarrhea.  Abdominal pain.  You have difficulty with urination, such as:  Incontinence.  Retention.  You have palpitations.  You develop orthostatic hypotension. When you stand up you may feel:  Dizzy.  Weak.  Faint.  You cannot attain and maintain an erection (in men).  You have vaginal dryness and problems with decreased sexual desire and arousal (in women).  You have severe pain in your thighs, legs, or buttocks.  You have   unexplained weight loss. Document Released: 11/08/2001 Document Revised: 06/20/2013 Document Reviewed: 02/08/2013 [employer] Patient Information 2014 [employer].  

## 2013-11-05 NOTE — Telephone Encounter (Signed)
Candace Offer, RN with Dr. Luther Parody office called today and pt's surgery has been rescheduled to March 11th.

## 2013-11-07 ENCOUNTER — Other Ambulatory Visit: Payer: Self-pay

## 2013-11-11 ENCOUNTER — Other Ambulatory Visit: Payer: Self-pay | Admitting: Interventional Cardiology

## 2013-11-13 ENCOUNTER — Ambulatory Visit (INDEPENDENT_AMBULATORY_CARE_PROVIDER_SITE_OTHER): Payer: Medicare Other | Admitting: *Deleted

## 2013-11-13 ENCOUNTER — Ambulatory Visit (INDEPENDENT_AMBULATORY_CARE_PROVIDER_SITE_OTHER): Payer: Medicare Other | Admitting: Interventional Cardiology

## 2013-11-13 ENCOUNTER — Encounter: Payer: Self-pay | Admitting: Interventional Cardiology

## 2013-11-13 VITALS — BP 110/52 | HR 84 | Ht 69.0 in | Wt 83.0 lb

## 2013-11-13 DIAGNOSIS — I6350 Cerebral infarction due to unspecified occlusion or stenosis of unspecified cerebral artery: Secondary | ICD-10-CM

## 2013-11-13 DIAGNOSIS — I639 Cerebral infarction, unspecified: Secondary | ICD-10-CM

## 2013-11-13 DIAGNOSIS — I5022 Chronic systolic (congestive) heart failure: Secondary | ICD-10-CM

## 2013-11-13 DIAGNOSIS — N186 End stage renal disease: Secondary | ICD-10-CM

## 2013-11-13 DIAGNOSIS — Z7901 Long term (current) use of anticoagulants: Secondary | ICD-10-CM

## 2013-11-13 DIAGNOSIS — I4891 Unspecified atrial fibrillation: Secondary | ICD-10-CM

## 2013-11-13 DIAGNOSIS — Z992 Dependence on renal dialysis: Secondary | ICD-10-CM

## 2013-11-13 DIAGNOSIS — I2510 Atherosclerotic heart disease of native coronary artery without angina pectoris: Secondary | ICD-10-CM

## 2013-11-13 DIAGNOSIS — Z5181 Encounter for therapeutic drug level monitoring: Secondary | ICD-10-CM

## 2013-11-13 LAB — POCT INR: INR: 3.5

## 2013-11-13 NOTE — Patient Instructions (Addendum)
11/17/2013 last day to take coumadin 11/18/2013 no coumadin  11/19/2013 no coumadin  11/20/2013 no coumadin 11/21/2013 no coumadin day of procedure then when instructed by MD doing procedure restart coumadin at same dose and keep appt in coumadin clinic on march 17th

## 2013-11-13 NOTE — Progress Notes (Signed)
Patient ID: [name], female   DOB: 1955/10/26, 58 y.o.   MRN: [id number]    [address], [address] [place],   [address] Phone: [phone] Fax:  [phone]  Date:  11/13/2013   ID:  [name], DOB 08-05-1956, MRN [id number]  PCP:  Placido Sou, MD   ASSESSMENT:  1. Paroxysmal atrial fibrillation, currently in sinus rhythm clinically 2. Anticoagulation has been subtherapeutic since discharge from the hospital. Luckily she has been maintaining sinus rhythm and has no neurological complaints 3. Chronic combined systolic and diastolic heart failure, stable 4. End-stage kidney disease with a coming dialysis graft revision by Dr. Donnetta Hutching  PLAN:  1. Continue anticoagulation in the Coumadin clinic. We would not plan on bridging with Lovenox given her end-stage renal disease. 2. Continue amiodarone therapy for control of rhythm 3. Clinical followup with me in 4-6 months. 4 amiodarone maintenance followup  4. Continue dialysis.   SUBJECTIVE: [name] is a 58 y.o. female who recently had a hospital stay for heart failure and atrial fibrillation. She underwent TEE guided cardioversion on the December 27th 2014. Amiodarone therapy has been continued since that time. She now needs both a colonoscopy and a dialysis fistula revision. She will need to be off Coumadin for those procedures. Unfortunately in our Coumadin clinic, she has not been therapeutic for the entire month of February. Luckily she has maintained sinus rhythm on amiodarone. She has no specific cardiovascular complaints.  Wt Readings from Last 3 Encounters:  11/13/13 83 lb (37.649 kg)  10/23/13 195 lb 1.7 oz (88.5 kg)  09/18/13 182 lb 12.2 oz (82.9 kg)     Past Medical History  Diagnosis Date  . Diabetes mellitus, type 2   . Secondary hyperparathyroidism   . OSA (obstructive sleep apnea)     NPSG 11.23.11-AHI 64.9/hr-central and obstructive apnea  . Ischemic cardiomyopathy     a. EF 25-30%  2011;  b.  Echocardiogram (09/01/2013): EF 30-35%, diffuse hypokinesis, MAC, mild MR, mild LAE, mild RVE, severely reduced RVSF, moderate RAE, mildly increased PASP.  [name] Pulmonary hypertension     Est PAsyst 60-Dr Linard Millers, off coumadin, CT neg for PE  . Hyperlipidemia   . Anemia   . Thyroid disease   . Pulmonary edema   . Peripheral vascular disease     a. Prior LE amputations. b. s/p prior LE bypass surgery.  . Sebaceous cyst   . 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 Richardson Dopp  . Shortness of breath     with exertion  . Hemodialysis patient     M,W,F  . TIA (transient ischemic attack) June 2014  . Ulcer of heel due to diabetes     left  . Kidney disease Dialysis MWF    ESRD secondary to DM, failed transplant 2006. Dialysis  . Atrial fibrillation     a. Dx 08/2013, placed on amiodarone.  . Chronic systolic CHF (congestive heart failure)   . CAD (coronary artery disease)     a. s/p CABG 2011.;  b. Lexiscan Myoview (02/2013): Intermediate risk study; EF 50%, basal inferolateral, basal anterolateral and mid inferolateral moderate ischemia; basal anterior, basal anteroseptal and mid anterior scar;  c. Type 2 NSTEMI during admission for LE revasc surgery 02/2013 in setting of SVT  . Polyneuropathy in diabetes(357.2) 10/31/2013  . Neuropathy     Current Outpatient Prescriptions  Medication Sig Dispense Refill  . acetaminophen (TYLENOL) 325 MG tablet Take 650-975 mg by mouth daily  as needed (pain).       [name] amiodarone (PACERONE) 200 MG tablet Take 1 tablet (200 mg total) by mouth daily.  60 tablet  0  . aspirin EC 81 MG tablet Take 81 mg by mouth daily.      [name] atorvastatin (LIPITOR) 10 MG tablet Take 10 mg by mouth daily before breakfast.       . B Complex-C-Folic Acid (DIALYVITE PO) Take 1 tablet by mouth daily.       . cinacalcet (SENSIPAR) 60 MG tablet Take 60 mg by mouth daily with lunch.       [name] HYDROcodone-acetaminophen (NORCO/VICODIN) 5-325 MG per tablet Take 325 tablets by mouth  daily.      . hydrOXYzine (ATARAX/VISTARIL) 25 MG tablet Take 25 mg by mouth every 6 (six) hours as needed for itching.       . insulin aspart (NOVOLOG FLEXPEN) 100 UNIT/ML FlexPen Inject 3-15 Units into the skin 3 (three) times daily with meals. Per SSI      . midodrine (PROAMATINE) 10 MG tablet Take 10 mg by mouth 2 (two) times daily.       Vladimir Faster Glycol-Propyl Glycol (SYSTANE OP) Apply 1 drop to eye 3 (three) times daily as needed (for dry eyes).      . pregabalin (LYRICA) 25 MG capsule Take 25 mg by mouth 2 (two) times daily.      [name] SANTYL ointment Apply 1 application topically every other day. Apply to wound stump      . sevelamer carbonate (RENVELA) 800 MG tablet Take 1,600 mg by mouth 3 (three) times daily with meals.      . silver sulfADIAZINE (SILVADENE) 1 % cream Apply 1 g topically daily.      [name] warfarin (COUMADIN) 1 MG tablet Take 3 mg by mouth daily.       No current facility-administered medications for this visit.    Allergies:    Allergies  Allergen Reactions  . Neurontin [Gabapentin] Anaphylaxis  . Ezetimibe-Simvastatin Other (See Comments)    REACTION: face edema; itching  . Klonopin [Clonazepam] Other (See Comments)    hallucination  . Sertraline Hcl Other (See Comments)    hallucination    Social History:  The patient  reports that she has never smoked. She has never used smokeless tobacco. She reports that she does not drink alcohol or use illicit drugs.   ROS:  Please see the history of present illness.   Decreased appetite. Chronic cough.   All other systems reviewed and negative.   OBJECTIVE: VS:  BP 110/52  Pulse 84  Ht 5\' 9"  (1.753 m)  Wt 83 lb (37.649 kg)  BMI 12.25 kg/m2 Well nourished, well developed, in no acute distress, appears older than stated age 84: normal Neck: JVD flat. Carotid bruit absent  Cardiac:  normal S1, S2; RRR; no murmur. A loud S4 gallop and almost without considered to be an S3 gallop is heard. This would clinically  confirmed sinus rhythm. Lungs:  clear to auscultation bilaterally, no wheezing, rhonchi or rales Abd: soft, nontender, no hepatomegaly Ext: Edema bilateral lower extremity amputee. Pulses bilateral lower extremity amputation Skin: warm and dry Neuro:  CNs 2-12 intact, no focal abnormalities noted  EKG:  Not repeated       Signed, Illene Labrador III, MD 11/13/2013 3:45 PM

## 2013-11-13 NOTE — Patient Instructions (Signed)
Your physician recommends that you continue on your current medications as directed. Please refer to the Current Medication list given to you today.  Your physician wants you to follow-up in: 6 months. You will receive a reminder letter in the mail two months in advance. If you don't receive a letter, please call our office to schedule the follow-up appointment.  

## 2013-11-14 NOTE — Telephone Encounter (Signed)
Pt saw Dr. Tamala Julian on 3/3.  Discussed Lovenox bridge at that time.  Given history of subtherapeutic INRs and that she is a dialysis pt, will not bridge at this time.  Pt aware to hold Coumadin 3 days prior to procedure.

## 2013-11-15 ENCOUNTER — Encounter (HOSPITAL_BASED_OUTPATIENT_CLINIC_OR_DEPARTMENT_OTHER): Payer: Medicare Other | Attending: Internal Medicine

## 2013-11-15 ENCOUNTER — Ambulatory Visit: Payer: Medicare Other | Admitting: Interventional Cardiology

## 2013-11-15 DIAGNOSIS — E1169 Type 2 diabetes mellitus with other specified complication: Secondary | ICD-10-CM | POA: Insufficient documentation

## 2013-11-15 DIAGNOSIS — L97809 Non-pressure chronic ulcer of other part of unspecified lower leg with unspecified severity: Secondary | ICD-10-CM | POA: Insufficient documentation

## 2013-11-15 DIAGNOSIS — S88119A Complete traumatic amputation at level between knee and ankle, unspecified lower leg, initial encounter: Secondary | ICD-10-CM | POA: Insufficient documentation

## 2013-11-20 ENCOUNTER — Encounter (HOSPITAL_COMMUNITY): Payer: Self-pay | Admitting: *Deleted

## 2013-11-20 MED ORDER — DEXTROSE 5 % IV SOLN
1.5000 g | INTRAVENOUS | Status: AC
  Administered 2013-11-21: 1.5 g via INTRAVENOUS
  Filled 2013-11-20: qty 1.5

## 2013-11-20 NOTE — Progress Notes (Signed)
Pt's last dose of Coumadin was Saturday, 11/17/13. Was not bridged with any other med.

## 2013-11-21 ENCOUNTER — Ambulatory Visit: Payer: Medicare Other | Admitting: Vascular Surgery

## 2013-11-21 ENCOUNTER — Encounter (HOSPITAL_COMMUNITY): Payer: Medicare Other

## 2013-11-21 ENCOUNTER — Encounter (HOSPITAL_COMMUNITY): Payer: Medicare Other | Admitting: Certified Registered"

## 2013-11-21 ENCOUNTER — Ambulatory Visit (HOSPITAL_COMMUNITY): Payer: Medicare Other | Admitting: Certified Registered"

## 2013-11-21 ENCOUNTER — Encounter (HOSPITAL_COMMUNITY): Payer: Self-pay | Admitting: Certified Registered"

## 2013-11-21 ENCOUNTER — Ambulatory Visit (HOSPITAL_COMMUNITY): Payer: Medicare Other

## 2013-11-21 ENCOUNTER — Ambulatory Visit (HOSPITAL_COMMUNITY)
Admission: RE | Admit: 2013-11-21 | Discharge: 2013-11-21 | Disposition: A | Discharge status: Home / Self Care | Payer: Medicare Other | Source: Ambulatory Visit | Attending: Vascular Surgery | Admitting: Vascular Surgery

## 2013-11-21 ENCOUNTER — Encounter (HOSPITAL_COMMUNITY)
Admission: RE | Disposition: A | Discharge status: Home / Self Care | Payer: Self-pay | Source: Ambulatory Visit | Attending: Vascular Surgery

## 2013-11-21 DIAGNOSIS — Z951 Presence of aortocoronary bypass graft: Secondary | ICD-10-CM | POA: Insufficient documentation

## 2013-11-21 DIAGNOSIS — T82898A Other specified complication of vascular prosthetic devices, implants and grafts, initial encounter: Secondary | ICD-10-CM

## 2013-11-21 DIAGNOSIS — E119 Type 2 diabetes mellitus without complications: Secondary | ICD-10-CM | POA: Insufficient documentation

## 2013-11-21 DIAGNOSIS — I2510 Atherosclerotic heart disease of native coronary artery without angina pectoris: Secondary | ICD-10-CM | POA: Insufficient documentation

## 2013-11-21 DIAGNOSIS — I2789 Other specified pulmonary heart diseases: Secondary | ICD-10-CM | POA: Insufficient documentation

## 2013-11-21 DIAGNOSIS — I509 Heart failure, unspecified: Secondary | ICD-10-CM | POA: Insufficient documentation

## 2013-11-21 DIAGNOSIS — D649 Anemia, unspecified: Secondary | ICD-10-CM | POA: Insufficient documentation

## 2013-11-21 DIAGNOSIS — Z992 Dependence on renal dialysis: Secondary | ICD-10-CM | POA: Insufficient documentation

## 2013-11-21 DIAGNOSIS — N186 End stage renal disease: Secondary | ICD-10-CM | POA: Insufficient documentation

## 2013-11-21 DIAGNOSIS — Z794 Long term (current) use of insulin: Secondary | ICD-10-CM | POA: Insufficient documentation

## 2013-11-21 DIAGNOSIS — E785 Hyperlipidemia, unspecified: Secondary | ICD-10-CM | POA: Insufficient documentation

## 2013-11-21 DIAGNOSIS — I5022 Chronic systolic (congestive) heart failure: Secondary | ICD-10-CM | POA: Insufficient documentation

## 2013-11-21 DIAGNOSIS — Y849 Medical procedure, unspecified as the cause of abnormal reaction of the patient, or of later complication, without mention of misadventure at the time of the procedure: Secondary | ICD-10-CM | POA: Insufficient documentation

## 2013-11-21 DIAGNOSIS — Z8673 Personal history of transient ischemic attack (TIA), and cerebral infarction without residual deficits: Secondary | ICD-10-CM | POA: Insufficient documentation

## 2013-11-21 DIAGNOSIS — I4891 Unspecified atrial fibrillation: Secondary | ICD-10-CM | POA: Insufficient documentation

## 2013-11-21 DIAGNOSIS — M329 Systemic lupus erythematosus, unspecified: Secondary | ICD-10-CM | POA: Insufficient documentation

## 2013-11-21 DIAGNOSIS — I739 Peripheral vascular disease, unspecified: Secondary | ICD-10-CM | POA: Insufficient documentation

## 2013-11-21 DIAGNOSIS — I120 Hypertensive chronic kidney disease with stage 5 chronic kidney disease or end stage renal disease: Secondary | ICD-10-CM | POA: Insufficient documentation

## 2013-11-21 DIAGNOSIS — G4733 Obstructive sleep apnea (adult) (pediatric): Secondary | ICD-10-CM | POA: Insufficient documentation

## 2013-11-21 DIAGNOSIS — N2581 Secondary hyperparathyroidism of renal origin: Secondary | ICD-10-CM | POA: Insufficient documentation

## 2013-11-21 DIAGNOSIS — I2589 Other forms of chronic ischemic heart disease: Secondary | ICD-10-CM | POA: Insufficient documentation

## 2013-11-21 HISTORY — PX: REVISON OF ARTERIOVENOUS FISTULA: SHX6074

## 2013-11-21 LAB — GLUCOSE, CAPILLARY: Glucose-Capillary: 104 mg/dL — ABNORMAL HIGH (ref 70–99)

## 2013-11-21 LAB — POCT I-STAT 4, (NA,K, GLUC, HGB,HCT)
Glucose, Bld: 112 mg/dL — ABNORMAL HIGH (ref 70–99)
HEMATOCRIT: 42 % (ref 36.0–46.0)
Hemoglobin: 14.3 g/dL (ref 12.0–15.0)
Potassium: 3.1 mEq/L — ABNORMAL LOW (ref 3.7–5.3)
Sodium: 140 mEq/L (ref 137–147)

## 2013-11-21 LAB — PROTIME-INR
INR: 1.11 (ref 0.00–1.49)
Prothrombin Time: 14.1 seconds (ref 11.6–15.2)

## 2013-11-21 LAB — APTT: aPTT: 31 seconds (ref 24–37)

## 2013-11-21 SURGERY — REVISON OF ARTERIOVENOUS FISTULA
Anesthesia: General | Site: Arm Upper | Laterality: Right

## 2013-11-21 MED ORDER — PHENYLEPHRINE HCL 10 MG/ML IJ SOLN
INTRAMUSCULAR | Status: DC | PRN
  Administered 2013-11-21 (×2): 40 ug via INTRAVENOUS

## 2013-11-21 MED ORDER — LIDOCAINE HCL (CARDIAC) 20 MG/ML IV SOLN
INTRAVENOUS | Status: AC
  Filled 2013-11-21: qty 5

## 2013-11-21 MED ORDER — SODIUM CHLORIDE 0.9 % IR SOLN
Status: DC | PRN
  Administered 2013-11-21: 1000 mL

## 2013-11-21 MED ORDER — THROMBIN 20000 UNITS EX SOLR
CUTANEOUS | Status: AC
  Filled 2013-11-21: qty 20000

## 2013-11-21 MED ORDER — EPHEDRINE SULFATE 50 MG/ML IJ SOLN
INTRAMUSCULAR | Status: DC | PRN
  Administered 2013-11-21: 10 mg via INTRAVENOUS

## 2013-11-21 MED ORDER — CHLORHEXIDINE GLUCONATE 4 % EX LIQD
60.0000 mL | Freq: Once | CUTANEOUS | Status: DC
  Filled 2013-11-21: qty 60

## 2013-11-21 MED ORDER — SODIUM CHLORIDE 0.9 % IR SOLN
Status: DC | PRN
  Administered 2013-11-21: 13:00:00

## 2013-11-21 MED ORDER — PROPOFOL 10 MG/ML IV BOLUS
INTRAVENOUS | Status: AC
  Filled 2013-11-21: qty 20

## 2013-11-21 MED ORDER — SUCCINYLCHOLINE CHLORIDE 20 MG/ML IJ SOLN
INTRAMUSCULAR | Status: AC
  Filled 2013-11-21: qty 1

## 2013-11-21 MED ORDER — SUCCINYLCHOLINE CHLORIDE 20 MG/ML IJ SOLN
INTRAMUSCULAR | Status: DC | PRN
  Administered 2013-11-21: 80 mg via INTRAVENOUS

## 2013-11-21 MED ORDER — FENTANYL CITRATE 0.05 MG/ML IJ SOLN
INTRAMUSCULAR | Status: AC
  Filled 2013-11-21: qty 5

## 2013-11-21 MED ORDER — PROPOFOL 10 MG/ML IV BOLUS
INTRAVENOUS | Status: DC | PRN
  Administered 2013-11-21: 120 mg via INTRAVENOUS

## 2013-11-21 MED ORDER — LIDOCAINE HCL (CARDIAC) 20 MG/ML IV SOLN
INTRAVENOUS | Status: DC | PRN
  Administered 2013-11-21: 60 mg via INTRAVENOUS

## 2013-11-21 MED ORDER — FENTANYL CITRATE 0.05 MG/ML IJ SOLN
INTRAMUSCULAR | Status: DC | PRN
  Administered 2013-11-21: 50 ug via INTRAVENOUS

## 2013-11-21 MED ORDER — HEPARIN SODIUM (PORCINE) 1000 UNIT/ML IJ SOLN
INTRAMUSCULAR | Status: DC | PRN
  Administered 2013-11-21: 4000 [IU] via INTRAVENOUS

## 2013-11-21 MED ORDER — ONDANSETRON HCL 4 MG/2ML IJ SOLN
INTRAMUSCULAR | Status: AC
  Filled 2013-11-21: qty 2

## 2013-11-21 MED ORDER — PHENYLEPHRINE 40 MCG/ML (10ML) SYRINGE FOR IV PUSH (FOR BLOOD PRESSURE SUPPORT)
PREFILLED_SYRINGE | INTRAVENOUS | Status: AC
  Filled 2013-11-21: qty 10

## 2013-11-21 MED ORDER — MIDAZOLAM HCL 2 MG/2ML IJ SOLN
INTRAMUSCULAR | Status: AC
  Filled 2013-11-21: qty 2

## 2013-11-21 MED ORDER — EPHEDRINE SULFATE 50 MG/ML IJ SOLN
INTRAMUSCULAR | Status: AC
  Filled 2013-11-21: qty 1

## 2013-11-21 MED ORDER — OXYCODONE-ACETAMINOPHEN 5-325 MG PO TABS: 1.0000 | ORAL_TABLET | Freq: Four times a day (QID) | ORAL | Status: DC | PRN

## 2013-11-21 MED ORDER — SODIUM CHLORIDE 0.9 % IV SOLN
INTRAVENOUS | Status: DC
  Administered 2013-11-21: 09:00:00 via INTRAVENOUS

## 2013-11-21 SURGICAL SUPPLY — 41 items
APL SKNCLS STERI-STRIP NONHPOA (GAUZE/BANDAGES/DRESSINGS) ×1
BENZOIN TINCTURE PRP APPL 2/3 (GAUZE/BANDAGES/DRESSINGS) ×3 IMPLANT
CANISTER SUCTION 2500CC (MISCELLANEOUS) ×3 IMPLANT
CLIP LIGATING EXTRA MED SLVR (CLIP) ×3 IMPLANT
CLIP LIGATING EXTRA SM BLUE (MISCELLANEOUS) ×3 IMPLANT
CLOSURE STERI-STRIP 1/2X4 (GAUZE/BANDAGES/DRESSINGS) ×1
CLOSURE WOUND 1/2 X4 (GAUZE/BANDAGES/DRESSINGS) ×1
CLSR STERI-STRIP ANTIMIC 1/2X4 (GAUZE/BANDAGES/DRESSINGS) ×1 IMPLANT
COVER PROBE W GEL 5X96 (DRAPES) ×3 IMPLANT
COVER SURGICAL LIGHT HANDLE (MISCELLANEOUS) ×3 IMPLANT
DECANTER SPIKE VIAL GLASS SM (MISCELLANEOUS) ×3 IMPLANT
ELECT REM PT RETURN 9FT ADLT (ELECTROSURGICAL) ×3
ELECTRODE REM PT RTRN 9FT ADLT (ELECTROSURGICAL) ×1 IMPLANT
GEL ULTRASOUND 20GR AQUASONIC (MISCELLANEOUS) ×2 IMPLANT
GLOVE BIO SURGEON STRL SZ7.5 (GLOVE) ×4 IMPLANT
GLOVE BIOGEL PI IND STRL 7.5 (GLOVE) IMPLANT
GLOVE BIOGEL PI IND STRL 8 (GLOVE) IMPLANT
GLOVE BIOGEL PI INDICATOR 7.5 (GLOVE) ×2
GLOVE BIOGEL PI INDICATOR 8 (GLOVE) ×4
GLOVE ECLIPSE 7.5 STRL STRAW (GLOVE) ×2 IMPLANT
GLOVE SS BIOGEL STRL SZ 7 (GLOVE) IMPLANT
GLOVE SS BIOGEL STRL SZ 7.5 (GLOVE) ×1 IMPLANT
GLOVE SUPERSENSE BIOGEL SZ 7 (GLOVE) ×2
GLOVE SUPERSENSE BIOGEL SZ 7.5 (GLOVE) ×2
GOWN STRL REUS W/ TWL LRG LVL3 (GOWN DISPOSABLE) ×3 IMPLANT
GOWN STRL REUS W/TWL LRG LVL3 (GOWN DISPOSABLE) ×12
KIT BASIN OR (CUSTOM PROCEDURE TRAY) ×3 IMPLANT
KIT ROOM TURNOVER OR (KITS) ×3 IMPLANT
NS IRRIG 1000ML POUR BTL (IV SOLUTION) ×3 IMPLANT
PACK CV ACCESS (CUSTOM PROCEDURE TRAY) ×3 IMPLANT
PAD ARMBOARD 7.5X6 YLW CONV (MISCELLANEOUS) ×6 IMPLANT
SPONGE GAUZE 4X4 12PLY (GAUZE/BANDAGES/DRESSINGS) ×3 IMPLANT
STRIP CLOSURE SKIN 1/2X4 (GAUZE/BANDAGES/DRESSINGS) ×2 IMPLANT
SUT PROLENE 6 0 CC (SUTURE) ×9 IMPLANT
SUT VIC AB 3-0 SH 27 (SUTURE) ×3
SUT VIC AB 3-0 SH 27X BRD (SUTURE) ×1 IMPLANT
TAPE CLOTH SURG 4X10 WHT LF (GAUZE/BANDAGES/DRESSINGS) ×2 IMPLANT
TOWEL OR 17X24 6PK STRL BLUE (TOWEL DISPOSABLE) ×3 IMPLANT
TOWEL OR 17X26 10 PK STRL BLUE (TOWEL DISPOSABLE) ×3 IMPLANT
UNDERPAD 30X30 INCONTINENT (UNDERPADS AND DIAPERS) ×3 IMPLANT
WATER STERILE IRR 1000ML POUR (IV SOLUTION) ×1 IMPLANT

## 2013-11-21 NOTE — H&P (View-Only) (Signed)
Established Dialysis Access  History of Present Illness  [name] is a 58 y.o. (08/05/1956) female who presents for re-evaluation for permanent access with signs of steal syndrome for the past 4 weeks.  She developed fissures on the thumb and pin point wound on the medial pointer finger.  She has applied lotion to the areas, but her thumb is becoming painful and her hand has decreased sensation.    Past Medical History  Diagnosis Date  . Diabetes mellitus, type 2   . Secondary hyperparathyroidism   . OSA (obstructive sleep apnea)     NPSG 11.23.11-AHI 64.9/hr-central and obstructive apnea  . Ischemic cardiomyopathy     a. EF 25-30% 2011;  b.  Echocardiogram (09/01/2013): EF 30-35%, diffuse hypokinesis, MAC, mild MR, mild LAE, mild RVE, severely reduced RVSF, moderate RAE, mildly increased PASP.  [name] Pulmonary hypertension     Est PAsyst 60-Dr Linard Millers, off coumadin, CT neg for PE  . Hyperlipidemia   . Anemia   . Thyroid disease   . Pulmonary edema   . Peripheral vascular disease     a. Prior LE amputations. b. s/p prior LE bypass surgery.  . Sebaceous cyst   . 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 Richardson Dopp  . Shortness of breath     with exertion  . Hemodialysis patient     M,W,F  . TIA (transient ischemic attack) June 2014  . Ulcer of heel due to diabetes     left  . Kidney disease Dialysis MWF    ESRD secondary to DM, failed transplant 2006. Dialysis  . Atrial fibrillation     a. Dx 08/2013, placed on amiodarone.  . Chronic systolic CHF (congestive heart failure)   . CAD (coronary artery disease)     a. s/p CABG 2011.;  b. Lexiscan Myoview (02/2013): Intermediate risk study; EF 50%, basal inferolateral, basal anterolateral and mid inferolateral moderate ischemia; basal anterior, basal anteroseptal and mid anterior scar;  c. Type 2 NSTEMI during admission for LE revasc surgery 02/2013 in setting of SVT   Review of Systems  Constitutional: Negative for fever and  chills.  HENT: Negative for congestion.   Eyes: Negative for double vision.  Respiratory: Negative for cough.   Cardiovascular: Negative for chest pain and palpitations.  Gastrointestinal: Negative for nausea and vomiting.  Neurological: Positive for sensory change. Negative for headaches.     Physical Examination  Filed Vitals:   10/23/13 1502  BP: 123/65  Pulse: 81  Height: 5\' 4"  (1.626 m)  Weight: 195 lb 1.7 oz (88.5 kg)  SpO2: 99%   Body mass index is 33.47 kg/(m^2).  General: A&O x 3, WD Pulmonary: Lungs CTA  Cardiac: RRR,  Vascular: Vessel Right Left  Radial Weak Palpable Palpable  Ulnar Palpable Palpable  Brachial Palpable Palpable   The right radial artery pulse  Is stronger with compression of the upper arm fistula  Gastrointestinal: soft, NTND  Musculoskeletal: Grip strength 4/5 right left hand 5/5  Neurologic:Sensation to touch left > right upper extremity  Right thumb tip wound with skin pallor, no drainage. Pointer medial finger pin point superficial wound. Nail bed pallor   Non-Invasive Vascular Imaging  Doppler Resting BP 110 mm hg compression of fistula creates BP 157 mm hg.    Medical Decision Making  [name] is a 58 y.o. female who presents with Sever steal syndrome with evidence of tissue loss over the last 4 weeks.  ESRD requiring hemodialysis. She dialyzes  [facility] , Dr. Donnetta Hutching would like to perform surgery 11/07/2013 which is a Wednesday if we can move her dialysis schedule that week to accommodate this it would be helpful.   He would per form a "DRIL" procedure Distal revascularization interval ligation of the right upper arm AV Fistula. Theda Sers EMMA [facility] PA-C  [facility] Office: [phone]   10/23/2013, 3:32 PM I have examined the patient, reviewed and agree with above. Very difficult problem. Does have an ischemic-looking ulcerations over her thumb and index finger which are painful in  her. She is having worsening numbness as well. This does appear to be related to steel phenomenon. I discussed options with the patient and have recommended a DRIL procedure to hopefully maintain patency of her fistula and also correct her steal. We will schedule this for 11/07/2013. She typically dialyzes on Monday Wednesday and Friday we will assess dialysis center to switch her dialysis to Monday Tuesday Friday of this week.  Kaushal Vannice, MD 10/23/2013 4:19 PM

## 2013-11-21 NOTE — Transfer of Care (Signed)
Immediate Anesthesia Transfer of Care Note  Patient: [name]  Procedure(s) Performed: Procedure(s): REVISON OF ARTERIOVENOUS FISTULA (Right)  Patient Location: PACU  Anesthesia Type:General  Level of Consciousness: awake, patient cooperative and responds to stimulation  Airway & Oxygen Therapy: Patient Spontanous Breathing and Patient connected to nasal cannula oxygen  Post-op Assessment: Report given to PACU RN and Post -op Vital signs reviewed and stable  Post vital signs: Reviewed and stable  Complications: No apparent anesthesia complications

## 2013-11-21 NOTE — Anesthesia Preprocedure Evaluation (Signed)
Anesthesia Evaluation  Patient identified by MRN, date of birth, ID band Patient awake    Reviewed: Allergy & Precautions, H&P , NPO status , Patient's Chart, lab work & pertinent test results  History of Anesthesia Complications Negative for: history of anesthetic complications  Airway Mallampati: II TM Distance: >3 FB Neck ROM: Full    Dental  (+) Edentulous Upper, Poor Dentition, Loose, Missing, Chipped   Pulmonary sleep apnea and Continuous Positive Airway Pressure Ventilation ,          Cardiovascular hypertension, Pt. on medications + CAD, + Peripheral Vascular Disease and +CHF + dysrhythmias     Neuro/Psych TIA   GI/Hepatic   Endo/Other  diabetes, Type 2, Insulin Dependent  Renal/GU ESRF and DialysisRenal disease     Musculoskeletal   Abdominal   Peds  Hematology  (+) anemia ,   Anesthesia Other Findings   Reproductive/Obstetrics                           Anesthesia Physical Anesthesia Plan  ASA: III  Anesthesia Plan: General   Post-op Pain Management:    Induction: Intravenous  Airway Management Planned: Oral ETT  Additional Equipment:   Intra-op Plan:   Post-operative Plan: Extubation in OR  Informed Consent: I have reviewed the patients History and Physical, chart, labs and discussed the procedure including the risks, benefits and alternatives for the proposed anesthesia with the patient or authorized representative who has indicated his/her understanding and acceptance.   Dental advisory given  Plan Discussed with: Anesthesiologist and Surgeon  Anesthesia Plan Comments:         Anesthesia Quick Evaluation

## 2013-11-21 NOTE — Anesthesia Procedure Notes (Signed)
Procedure Name: Intubation Date/Time: 11/21/2013 11:56 AM Performed by: Manuela Schwartz B Pre-anesthesia Checklist: Patient identified, Emergency Drugs available, Suction available, Patient being monitored and Timeout performed Patient Re-evaluated:Patient Re-evaluated prior to inductionOxygen Delivery Method: Circle system utilized Preoxygenation: Pre-oxygenation with 100% oxygen Intubation Type: IV induction and Rapid sequence Laryngoscope Size: Mac and 3 Grade View: Grade I Tube type: Oral Tube size: 7.5 mm Number of attempts: 1 Airway Equipment and Method: Stylet Placement Confirmation: ETT inserted through vocal cords under direct vision,  positive ETCO2 and breath sounds checked- equal and bilateral Secured at: 21 cm Tube secured with: Tape Dental Injury: Teeth and Oropharynx as per pre-operative assessment

## 2013-11-21 NOTE — Anesthesia Postprocedure Evaluation (Signed)
  Anesthesia Post-op Note  Patient: [name]  Procedure(s) Performed: Procedure(s): REVISON OF ARTERIOVENOUS FISTULA (Right)  Patient Location: PACU  Anesthesia Type:General  Level of Consciousness: awake, alert  and oriented  Airway and Oxygen Therapy: Patient Spontanous Breathing  Post-op Pain: mild  Post-op Assessment: Post-op Vital signs reviewed, Patient's Cardiovascular Status Stable, Respiratory Function Stable, Patent Airway, No signs of Nausea or vomiting and Pain level controlled  Post-op Vital Signs: Reviewed and stable  Complications: No apparent anesthesia complications

## 2013-11-21 NOTE — Interval H&P Note (Signed)
History and Physical Interval Note:  11/21/2013 11:12 AM  [name]  has presented today for surgery, with the diagnosis of Steal syndrome right arm  The various methods of treatment have been discussed with the patient and family. After consideration of risks, benefits and other options for treatment, the patient has consented to  Procedure(s): REVISON OF ARTERIOVENOUS FISTULA (Right) as a surgical intervention .  The patient's history has been reviewed, patient examined, no change in status, stable for surgery.  I have reviewed the patient's chart and labs.  Questions were answered to the patient's satisfaction.     [name]

## 2013-11-21 NOTE — Op Note (Signed)
    OPERATIVE REPORT  DATE OF SURGERY: 11/21/2013  PATIENT: [name], 58 y.o. female MRN: [id number]  DOB: 1955/10/29  PRE-OPERATIVE DIAGNOSIS: Right hand ischemia right upper arm AV fistula  POST-OPERATIVE DIAGNOSIS:  Same  PROCEDURE: DRIL right upper arm AV fistula  SURGEON:  Curt Jews, M.D.  PHYSICIAN ASSISTANT:  Collins   ANESTHESIA:   general  EBL: Less than 100  ml     BLOOD ADMINISTERED:  9   DRAINS:  none   SPECIMEN:  none   COUNTS CORRECT:  YES  PLAN OF CARE: PACU    PATIENT DISPOSITION:  PACU - hemodynamically stable  PROCEDURE DETAILS:  patient has a long history of end-stage renal disease with a functioning right upper arm AV fistula. She was seen in our office with ischemia in her hand with tissue loss on the tips of several fingers in her thumb.  She did have diminished flow at the level of the wrist by Doppler there was some augmentation with the compression of this. I did discuss the option of ligation but have recommended revision with a drill procedure.    The patient was taken to the operating room. The right thigh was visualized and the circumflex saphenous vein was surgically absent. The patient had a left AK amputation. By SonoSite ultrasound the patient did have a good sized basilic vein at the level of antecubital space distal to this would be possible use for conduit. Incision was made transversely at the old arteriovenous anastomosis of the right upper arm AV fistula. The brachial artery was isolated distal to the fistula. A separate incision was made over the brachial artery above the antecubital space and the basilic vein was identified through the same incision. The basilic vein was of good caliber and was harvested from this incision down to below the elbow. The vein was gently dilated and was felt to be of good size for bypass. The patient was given 4000 units of intravenous heparin after adequate circulation time the brachial artery was  occluded just below the old AV anastomosis. The brachial artery was occluded distally and was transected. The artery was oversewn just below the takeoff of the fistula. The basilic vein was brought onto the field and was reversed and was sewn end to end to the brachial artery below the antecubital space with a running 6-0 Prolene suture. This anastomosis tested and found to be adequate. The vein was then brought through a subcutaneous tunnel up to the level of the brachial artery above the elbow. The artery did have moderate amount of atherosclerotic disease. The artery was occluded proximally and distally was opened with an 11 blade and extended longitudinally with Potts scissors. The vein was cut to appropriate length and was sewn end-to-side to the artery with a running 6-0 Prolene suture. Clamps removed and excellent flow was noted. Wound irrigated with saline hemostasis obtained electrocautery the wounds were closed with 3-0 Vicryl in the subcutaneous and subcuticular tissue. The patient was taken to the recovery in stable condition 6   Curt Jews, M.D. 11/21/2013 7:54 PM

## 2013-11-22 ENCOUNTER — Encounter (HOSPITAL_COMMUNITY): Payer: Self-pay | Admitting: Vascular Surgery

## 2013-11-23 ENCOUNTER — Telehealth: Payer: Self-pay | Admitting: Vascular Surgery

## 2013-11-23 NOTE — Telephone Encounter (Addendum)
Message copied by Gena Fray on Fri Nov 23, 2013  3:29 PM ------      Message from: Denman George      Created: Fri Nov 23, 2013 12:33 PM      Regarding: appt. for f/u      Contact: [phone]       This patient had a DRIL procedure/Right Arm AVF 11/21/13 by Dr. Donnetta Hutching there was no staff message for f/u.  She will need a 1 mo. f/u. w/ TFE. Thanks. ------  11/22/13: spoke with patient, dpm

## 2013-11-27 ENCOUNTER — Ambulatory Visit: Payer: Medicare Other | Admitting: Internal Medicine

## 2013-11-27 ENCOUNTER — Ambulatory Visit (INDEPENDENT_AMBULATORY_CARE_PROVIDER_SITE_OTHER): Payer: Medicare Other | Admitting: Pharmacist

## 2013-11-27 DIAGNOSIS — I6350 Cerebral infarction due to unspecified occlusion or stenosis of unspecified cerebral artery: Secondary | ICD-10-CM

## 2013-11-27 DIAGNOSIS — I639 Cerebral infarction, unspecified: Secondary | ICD-10-CM

## 2013-11-27 DIAGNOSIS — I4891 Unspecified atrial fibrillation: Secondary | ICD-10-CM

## 2013-11-27 DIAGNOSIS — Z7901 Long term (current) use of anticoagulants: Secondary | ICD-10-CM

## 2013-11-27 DIAGNOSIS — Z5181 Encounter for therapeutic drug level monitoring: Secondary | ICD-10-CM

## 2013-11-27 LAB — POCT INR: INR: 1.3

## 2013-11-29 ENCOUNTER — Ambulatory Visit: Payer: Medicare Other | Admitting: Internal Medicine

## 2013-12-04 ENCOUNTER — Ambulatory Visit (INDEPENDENT_AMBULATORY_CARE_PROVIDER_SITE_OTHER): Payer: Medicare Other

## 2013-12-04 DIAGNOSIS — Z5181 Encounter for therapeutic drug level monitoring: Secondary | ICD-10-CM

## 2013-12-04 DIAGNOSIS — I4891 Unspecified atrial fibrillation: Secondary | ICD-10-CM

## 2013-12-04 DIAGNOSIS — Z7901 Long term (current) use of anticoagulants: Secondary | ICD-10-CM

## 2013-12-04 DIAGNOSIS — I639 Cerebral infarction, unspecified: Secondary | ICD-10-CM

## 2013-12-04 DIAGNOSIS — I6350 Cerebral infarction due to unspecified occlusion or stenosis of unspecified cerebral artery: Secondary | ICD-10-CM

## 2013-12-04 LAB — POCT INR: INR: 1.3

## 2013-12-07 ENCOUNTER — Telehealth: Payer: Self-pay | Admitting: Interventional Cardiology

## 2013-12-07 NOTE — Telephone Encounter (Signed)
Received request from Nurse fax box, documents faxed for surgical clearance. To: [place] Fax number: [phone] Attention: [phone]/kdm

## 2013-12-09 ENCOUNTER — Other Ambulatory Visit: Payer: Self-pay | Admitting: Interventional Cardiology

## 2013-12-11 ENCOUNTER — Ambulatory Visit (INDEPENDENT_AMBULATORY_CARE_PROVIDER_SITE_OTHER): Payer: Medicare Other | Admitting: *Deleted

## 2013-12-11 DIAGNOSIS — Z7901 Long term (current) use of anticoagulants: Secondary | ICD-10-CM

## 2013-12-11 DIAGNOSIS — Z5181 Encounter for therapeutic drug level monitoring: Secondary | ICD-10-CM

## 2013-12-11 DIAGNOSIS — I639 Cerebral infarction, unspecified: Secondary | ICD-10-CM

## 2013-12-11 DIAGNOSIS — I6350 Cerebral infarction due to unspecified occlusion or stenosis of unspecified cerebral artery: Secondary | ICD-10-CM

## 2013-12-11 DIAGNOSIS — I4891 Unspecified atrial fibrillation: Secondary | ICD-10-CM

## 2013-12-11 LAB — POCT INR: INR: 1.4

## 2013-12-13 ENCOUNTER — Encounter (HOSPITAL_BASED_OUTPATIENT_CLINIC_OR_DEPARTMENT_OTHER): Payer: Medicare Other | Attending: Internal Medicine

## 2013-12-13 DIAGNOSIS — E1169 Type 2 diabetes mellitus with other specified complication: Secondary | ICD-10-CM | POA: Insufficient documentation

## 2013-12-13 DIAGNOSIS — L97809 Non-pressure chronic ulcer of other part of unspecified lower leg with unspecified severity: Secondary | ICD-10-CM | POA: Insufficient documentation

## 2013-12-13 DIAGNOSIS — Z992 Dependence on renal dialysis: Secondary | ICD-10-CM | POA: Insufficient documentation

## 2013-12-13 DIAGNOSIS — S88119A Complete traumatic amputation at level between knee and ankle, unspecified lower leg, initial encounter: Secondary | ICD-10-CM | POA: Insufficient documentation

## 2013-12-17 ENCOUNTER — Encounter: Payer: Self-pay | Admitting: Vascular Surgery

## 2013-12-18 ENCOUNTER — Ambulatory Visit (INDEPENDENT_AMBULATORY_CARE_PROVIDER_SITE_OTHER): Payer: Self-pay | Admitting: Vascular Surgery

## 2013-12-18 ENCOUNTER — Ambulatory Visit (INDEPENDENT_AMBULATORY_CARE_PROVIDER_SITE_OTHER): Payer: Medicare Other | Admitting: *Deleted

## 2013-12-18 ENCOUNTER — Encounter: Payer: Self-pay | Admitting: Vascular Surgery

## 2013-12-18 VITALS — BP 108/67 | HR 105 | Resp 16 | Ht <= 58 in | Wt 183.0 lb

## 2013-12-18 DIAGNOSIS — Z5181 Encounter for therapeutic drug level monitoring: Secondary | ICD-10-CM

## 2013-12-18 DIAGNOSIS — I6350 Cerebral infarction due to unspecified occlusion or stenosis of unspecified cerebral artery: Secondary | ICD-10-CM

## 2013-12-18 DIAGNOSIS — I639 Cerebral infarction, unspecified: Secondary | ICD-10-CM

## 2013-12-18 DIAGNOSIS — Z7901 Long term (current) use of anticoagulants: Secondary | ICD-10-CM

## 2013-12-18 DIAGNOSIS — N186 End stage renal disease: Secondary | ICD-10-CM

## 2013-12-18 DIAGNOSIS — I739 Peripheral vascular disease, unspecified: Secondary | ICD-10-CM

## 2013-12-18 DIAGNOSIS — I4891 Unspecified atrial fibrillation: Secondary | ICD-10-CM

## 2013-12-18 LAB — POCT INR: INR: 1.4

## 2013-12-18 MED ORDER — WARFARIN SODIUM 4 MG PO TABS: ORAL_TABLET | ORAL | Status: DC

## 2013-12-18 NOTE — Progress Notes (Signed)
Patient is here for followup status post drill seizure right arm. She does have eschar over her at some and has lost a meal of her fifth finger. She does have a warm well-perfused hand. She does have swelling she reports is resolving. She does have good thrill in her upper arm fistula.  No concern today is a blister over her knee for BKA. 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 This doesn't be partial thickness. She is seen the wound center currently and will continue followup with this. I explained does appear to be partial thickness require local wound care once a blister bursts.  I continue to have some hand ischemia despite drill. This is tolerable for currently. She will continue use her fistula. Understands that the only next option would be ligation of the fistula triply can be of all he voided. She did have removal of aneurysmal left upper arm AV fistula in the past at an outlying center. She may be a candidate for left arm AV access oh have to have imaging studies to determine if she is patent central system. We'll see [place] on an as-needed basis

## 2013-12-25 ENCOUNTER — Ambulatory Visit (INDEPENDENT_AMBULATORY_CARE_PROVIDER_SITE_OTHER): Payer: Medicare Other | Admitting: *Deleted

## 2013-12-25 DIAGNOSIS — Z5181 Encounter for therapeutic drug level monitoring: Secondary | ICD-10-CM

## 2013-12-25 DIAGNOSIS — Z7901 Long term (current) use of anticoagulants: Secondary | ICD-10-CM

## 2013-12-25 DIAGNOSIS — I639 Cerebral infarction, unspecified: Secondary | ICD-10-CM

## 2013-12-25 DIAGNOSIS — I4891 Unspecified atrial fibrillation: Secondary | ICD-10-CM

## 2013-12-25 DIAGNOSIS — I6350 Cerebral infarction due to unspecified occlusion or stenosis of unspecified cerebral artery: Secondary | ICD-10-CM

## 2013-12-25 LAB — POCT INR: INR: 4.7

## 2014-01-03 ENCOUNTER — Ambulatory Visit (INDEPENDENT_AMBULATORY_CARE_PROVIDER_SITE_OTHER): Payer: Medicare Other | Admitting: *Deleted

## 2014-01-03 DIAGNOSIS — Z7901 Long term (current) use of anticoagulants: Secondary | ICD-10-CM

## 2014-01-03 DIAGNOSIS — I639 Cerebral infarction, unspecified: Secondary | ICD-10-CM

## 2014-01-03 DIAGNOSIS — I6350 Cerebral infarction due to unspecified occlusion or stenosis of unspecified cerebral artery: Secondary | ICD-10-CM

## 2014-01-03 DIAGNOSIS — I4891 Unspecified atrial fibrillation: Secondary | ICD-10-CM

## 2014-01-03 DIAGNOSIS — Z5181 Encounter for therapeutic drug level monitoring: Secondary | ICD-10-CM

## 2014-01-03 LAB — POCT INR: INR: 1.7

## 2014-01-07 HISTORY — PX: OTHER SURGICAL HISTORY: SHX169

## 2014-01-14 ENCOUNTER — Encounter: Payer: Self-pay | Admitting: Interventional Cardiology

## 2014-01-14 ENCOUNTER — Ambulatory Visit (INDEPENDENT_AMBULATORY_CARE_PROVIDER_SITE_OTHER): Payer: Medicare Other | Admitting: Pharmacist

## 2014-01-14 ENCOUNTER — Ambulatory Visit (INDEPENDENT_AMBULATORY_CARE_PROVIDER_SITE_OTHER): Payer: Medicare Other | Admitting: Interventional Cardiology

## 2014-01-14 VITALS — BP 113/62 | HR 83 | Ht <= 58 in | Wt 178.0 lb

## 2014-01-14 DIAGNOSIS — Z7901 Long term (current) use of anticoagulants: Secondary | ICD-10-CM

## 2014-01-14 DIAGNOSIS — Z5181 Encounter for therapeutic drug level monitoring: Secondary | ICD-10-CM

## 2014-01-14 DIAGNOSIS — I2510 Atherosclerotic heart disease of native coronary artery without angina pectoris: Secondary | ICD-10-CM

## 2014-01-14 DIAGNOSIS — Z79899 Other long term (current) drug therapy: Secondary | ICD-10-CM

## 2014-01-14 DIAGNOSIS — I4891 Unspecified atrial fibrillation: Secondary | ICD-10-CM

## 2014-01-14 DIAGNOSIS — I639 Cerebral infarction, unspecified: Secondary | ICD-10-CM

## 2014-01-14 DIAGNOSIS — I6350 Cerebral infarction due to unspecified occlusion or stenosis of unspecified cerebral artery: Secondary | ICD-10-CM

## 2014-01-14 DIAGNOSIS — I5022 Chronic systolic (congestive) heart failure: Secondary | ICD-10-CM

## 2014-01-14 LAB — POCT INR: INR: 1.8

## 2014-01-14 MED ORDER — AMIODARONE HCL 200 MG PO TABS: 200.0000 mg | ORAL_TABLET | Freq: Every day | ORAL | Status: AC

## 2014-01-14 NOTE — Patient Instructions (Signed)
Your physician recommends that you continue on your current medications as directed. Please refer to the Current Medication list given to you today.  Your physician recommends that you return for lab work in: 5 months (Tsh,Hepatic)  Your physician wants you to follow-up in: 5 months You will receive a reminder letter in the mail two months in advance. If you don't receive a letter, please call our office to schedule the follow-up appointment.

## 2014-01-14 NOTE — Progress Notes (Signed)
Patient ID: ROMELIA BROMELL, female   DOB: 1956/04/15, 58 y.o.   MRN: [id number]    [address], [address] [place], [place]  [address] Phone: [phone] Fax:  [phone]  Date:  01/14/2014   ID:  MIALEE WEYMAN, DOB [birth year], MRN [id number]  PCP:  Placido Sou, MD   ASSESSMENT:  1. Chronic kidney failure with chronic dialysis 2. Paroxysmal atrial fibrillation 3. Chronic amiodarone therapy for A. fib suppression 4. Chronic systolic heart failure  PLAN:  1. Continue amiodarone. Followup in 4-5 months with TSH, hepatic panel, and cardiology office visit 2. No change in current therapy. Coumadin should be lifelong. 3. Continue dialysis 4. Overall poor prognosis   SUBJECTIVE: DAJAH FISCHMAN is a 58 y.o. female who is doing relatively well. She denies dyspnea and angina. She has not had syncope. No blood in the urine or stool. She denies orthopnea, PND, and has been able to withstand dialysis without significant low blood pressure.   Wt Readings from Last 3 Encounters:  01/14/14 178 lb (80.74 kg)  12/18/13 182 lb 15.7 oz (83 kg)  11/13/13 83 lb (37.649 kg)     Past Medical History  Diagnosis Date  . Diabetes mellitus, type 2   . Secondary hyperparathyroidism   . Ischemic cardiomyopathy     a. EF 25-30% 2011;  b.  Echocardiogram (09/01/2013): EF 30-35%, diffuse hypokinesis, MAC, mild MR, mild LAE, mild RVE, severely reduced RVSF, moderate RAE, mildly increased PASP.  [name] Pulmonary hypertension     Est PAsyst 60-Dr Linard Millers, off coumadin, CT neg for PE  . Hyperlipidemia   . Anemia   . Thyroid disease   . Pulmonary edema   . Peripheral vascular disease     a. Prior LE amputations. b. s/p prior LE bypass surgery.  . Sebaceous cyst   . 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 Richardson Dopp  . Shortness of breath     with exertion  . Hemodialysis patient     M,W,F  . TIA (transient ischemic attack) June 2014  . Ulcer of heel due to diabetes     left  . Kidney  disease Dialysis MWF    ESRD secondary to DM, failed transplant 2006. Dialysis  . Atrial fibrillation     a. Dx 08/2013, placed on amiodarone.  . Chronic systolic CHF (congestive heart failure)   . CAD (coronary artery disease)     a. s/p CABG 2011.;  b. Lexiscan Myoview (02/2013): Intermediate risk study; EF 50%, basal inferolateral, basal anterolateral and mid inferolateral moderate ischemia; basal anterior, basal anteroseptal and mid anterior scar;  c. Type 2 NSTEMI during admission for LE revasc surgery 02/2013 in setting of SVT  . Polyneuropathy in diabetes(357.2) 10/31/2013  . Neuropathy   . OSA (obstructive sleep apnea)     uses c-papNPSG 11.23.11-AHI 64.9/hr-central and obstructive apnea    Current Outpatient Prescriptions  Medication Sig Dispense Refill  . acetaminophen (TYLENOL) 325 MG tablet Take 650-975 mg by mouth daily as needed (pain).       [name] amiodarone (PACERONE) 200 MG tablet Take 1 tablet (200 mg total) by mouth daily.  60 tablet  0  . aspirin EC 81 MG tablet Take 81 mg by mouth daily.      [name] atorvastatin (LIPITOR) 10 MG tablet Take 10 mg by mouth daily before breakfast.       . B Complex-C-Folic Acid (DIALYVITE PO) Take 1 tablet by mouth daily.       [name]  cinacalcet (SENSIPAR) 60 MG tablet Take 60 mg by mouth daily with lunch.       . hydrOXYzine (ATARAX/VISTARIL) 25 MG tablet Take 25 mg by mouth every 6 (six) hours as needed for itching.       . insulin aspart (NOVOLOG FLEXPEN) 100 UNIT/ML FlexPen Inject 3-15 Units into the skin 3 (three) times daily with meals. Per SSI      . midodrine (PROAMATINE) 10 MG tablet Take 10 mg by mouth daily.       . pregabalin (LYRICA) 25 MG capsule Take 25 mg by mouth 2 (two) times daily.      . sevelamer carbonate (RENVELA) 800 MG tablet Take 1,600 mg by mouth 3 (three) times daily with meals.      . warfarin (COUMADIN) 4 MG tablet Take as directed by coumadin clinic  40 tablet  1  . Polyethyl Glycol-Propyl Glycol (SYSTANE OP) Apply 1 drop to  eye 3 (three) times daily as needed (for dry eyes).       No current facility-administered medications for this visit.    Allergies:    Allergies  Allergen Reactions  . Neurontin [Gabapentin] Anaphylaxis  . Ezetimibe-Simvastatin Other (See Comments)    REACTION: face edema; itching  . Klonopin [Clonazepam] Other (See Comments)    hallucination  . Sertraline Hcl Other (See Comments)    hallucination    Social History:  The patient  reports that she has never smoked. She has never used smokeless tobacco. She reports that she does not drink alcohol or use illicit drugs.   ROS:  Please see the history of present illness.   Decreased vision. No chest pain.   All other systems reviewed and negative.   OBJECTIVE: VS:  BP 113/62  Pulse 83  Ht 4\' 8"  (1.422 m)  Wt 178 lb (80.74 kg)  BMI 39.93 kg/m2 Well nourished, well developed, in no acute distress, older than stated age 7: normal Neck: JVD flat. Carotid bruit absent  Cardiac:  normal S1, S2; RRR; no murmur. An S3 gallop is audible Lungs:  clear to auscultation bilaterally, no wheezing, rhonchi or rales Abd: soft, nontender, no hepatomegaly Ext: Edema no edema bilateral lower extremity amputee. Pulses difficult to palpate Skin: warm and dry Neuro:  CNs 2-12 intact, no focal abnormalities noted  EKG:  Not repeated       Signed, Illene Labrador III, MD 01/14/2014 4:31 PM

## 2014-01-14 NOTE — Progress Notes (Deleted)
Patient ID: [name], female   DOB: [birth year], 58 y.o.   MRN: [id number]    [address], [address], [place]  [address] Phone: [phone] Fax:  [phone]  Date:  01/14/2014   ID:  [name], DOB 1956/02/08, MRN [id number]  PCP:  Placido Sou, MD   ASSESSMENT:  1. Hypertension, stable 2. Hyperlipidemia, unable to tolerate statins 3. Anxiety  PLAN:  1. basic metabolic panel 2. Followup in one year 3. Change the medication sheet to reflect that she is on fenofibrate for blood pressure   SUBJECTIVE: [name] is a 58 y.o. female who has hyperlipidemia and hypertension. She is doing well. She states that she feels that something is not right. No chest discomfort or any cardiac complaints to speak of. She denies lower extremity swelling. No orthopnea, PND, palpitations, or other complaints.   Wt Readings from Last 3 Encounters:  01/14/14 178 lb (80.74 kg)  12/18/13 182 lb 15.7 oz (83 kg)  11/13/13 83 lb (37.649 kg)     Past Medical History  Diagnosis Date  . Diabetes mellitus, type 2   . Secondary hyperparathyroidism   . Ischemic cardiomyopathy     a. EF 25-30% 2011;  b.  Echocardiogram (09/01/2013): EF 30-35%, diffuse hypokinesis, MAC, mild MR, mild LAE, mild RVE, severely reduced RVSF, moderate RAE, mildly increased PASP.  [name] Pulmonary hypertension     Est PAsyst 60-Dr Linard Millers, off coumadin, CT neg for PE  . Hyperlipidemia   . Anemia   . Thyroid disease   . Pulmonary edema   . Peripheral vascular disease     a. Prior LE amputations. b. s/p prior LE bypass surgery.  . Sebaceous cyst   . 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 Richardson Dopp  . Shortness of breath     with exertion  . Hemodialysis patient     M,W,F  . TIA (transient ischemic attack) June 2014  . Ulcer of heel due to diabetes     left  . Kidney disease Dialysis MWF    ESRD secondary to DM, failed transplant 2006. Dialysis  . Atrial fibrillation     a. Dx 08/2013,  placed on amiodarone.  . Chronic systolic CHF (congestive heart failure)   . CAD (coronary artery disease)     a. s/p CABG 2011.;  b. Lexiscan Myoview (02/2013): Intermediate risk study; EF 50%, basal inferolateral, basal anterolateral and mid inferolateral moderate ischemia; basal anterior, basal anteroseptal and mid anterior scar;  c. Type 2 NSTEMI during admission for LE revasc surgery 02/2013 in setting of SVT  . Polyneuropathy in diabetes(357.2) 10/31/2013  . Neuropathy   . OSA (obstructive sleep apnea)     uses c-papNPSG 11.23.11-AHI 64.9/hr-central and obstructive apnea    Current Outpatient Prescriptions  Medication Sig Dispense Refill  . acetaminophen (TYLENOL) 325 MG tablet Take 650-975 mg by mouth daily as needed (pain).       [name] amiodarone (PACERONE) 200 MG tablet Take 1 tablet (200 mg total) by mouth daily.  60 tablet  0  . aspirin EC 81 MG tablet Take 81 mg by mouth daily.      [name] atorvastatin (LIPITOR) 10 MG tablet Take 10 mg by mouth daily before breakfast.       . B Complex-C-Folic Acid (DIALYVITE PO) Take 1 tablet by mouth daily.       . cinacalcet (SENSIPAR) 60 MG tablet Take 60 mg by mouth daily with lunch.       [name]  hydrOXYzine (ATARAX/VISTARIL) 25 MG tablet Take 25 mg by mouth every 6 (six) hours as needed for itching.       . insulin aspart (NOVOLOG FLEXPEN) 100 UNIT/ML FlexPen Inject 3-15 Units into the skin 3 (three) times daily with meals. Per SSI      . midodrine (PROAMATINE) 10 MG tablet Take 10 mg by mouth daily.       . pregabalin (LYRICA) 25 MG capsule Take 25 mg by mouth 2 (two) times daily.      . sevelamer carbonate (RENVELA) 800 MG tablet Take 1,600 mg by mouth 3 (three) times daily with meals.      . warfarin (COUMADIN) 4 MG tablet Take as directed by coumadin clinic  40 tablet  1  . Polyethyl Glycol-Propyl Glycol (SYSTANE OP) Apply 1 drop to eye 3 (three) times daily as needed (for dry eyes).       No current facility-administered medications for this visit.     Allergies:    Allergies  Allergen Reactions  . Neurontin [Gabapentin] Anaphylaxis  . Ezetimibe-Simvastatin Other (See Comments)    REACTION: face edema; itching  . Klonopin [Clonazepam] Other (See Comments)    hallucination  . Sertraline Hcl Other (See Comments)    hallucination    Social History:  The patient  reports that she has never smoked. She has never used smokeless tobacco. She reports that she does not drink alcohol or use illicit drugs.   ROS:  Please see the history of present illness.   Appetite is stable. No episodes of syncope or palpitation   All other systems reviewed and negative.   OBJECTIVE: VS:  BP 113/62  Pulse 83  Ht 4\' 8"  (1.422 m)  Wt 178 lb (80.74 kg)  BMI 39.93 kg/m2 Well nourished, well developed, in no acute distress, appears than her stated age 73: normal Neck: JVD flat. Carotid bruit absent  Cardiac:  normal S1, S2; RRR; no murmur Lungs:  clear to auscultation bilaterally, no wheezing, rhonchi or rales Abd: soft, nontender, no hepatomegaly Ext: Edema absent. Pulses 2+ and symmetric in the upper and lower extremities Skin: warm and dry Neuro:  CNs 2-12 intact, no focal abnormalities noted  EKG:  Normal sinus rhythm with nonspecific ST-T wave abnormality       Signed, Illene Labrador III, MD 01/14/2014 4:06 PM

## 2014-01-15 ENCOUNTER — Encounter: Payer: Self-pay | Admitting: Interventional Cardiology

## 2014-01-21 ENCOUNTER — Encounter: Payer: Self-pay | Admitting: Vascular Surgery

## 2014-01-22 ENCOUNTER — Ambulatory Visit (INDEPENDENT_AMBULATORY_CARE_PROVIDER_SITE_OTHER): Payer: Self-pay | Admitting: Vascular Surgery

## 2014-01-22 ENCOUNTER — Encounter: Payer: Self-pay | Admitting: Vascular Surgery

## 2014-01-22 VITALS — BP 123/79 | HR 91 | Resp 16 | Ht <= 58 in | Wt 184.1 lb

## 2014-01-22 DIAGNOSIS — T82898A Other specified complication of vascular prosthetic devices, implants and grafts, initial encounter: Secondary | ICD-10-CM

## 2014-01-22 DIAGNOSIS — N186 End stage renal disease: Secondary | ICD-10-CM

## 2014-01-22 NOTE — Progress Notes (Signed)
Here today for followup of her drill procedure on 11/21/2013. Her incisions have all healed quite nicely. She does continue to report discomfort in her right palm. She is using her right upper arm AV fistula for dialysis without difficulty  On physical exam she does have an eschar which is less than 1 cm over the tip of her some and some loosening of her nail bed. Her feet and does look better perfused and the remaining digits no evidence of ischemia. She is an excellent thrill in her upper arm fistula  I did listen to her hand and wrist with the hand-held Doppler. She has excellent radial and ulnar flow at the level of the wrist with her fistula patent. There is slight augmentation with compression of her fistula. She also has a digital signal on her medial thumb  Impression and plan stable following drill procedure for steel in her right hand. She does have pain in her thumb. I think this will take quite some time for this eschar and separated for to have improvement. Her he does look much better perfused than before.  I explained that her on the only other option would be ligation her fistula I suspect this would have minimal if any impact on improving her pain. She understands and wished to continue observation. She certainly has very limited further access options with bilateral lower extremity amputations. We'll continue to followup in the hemodialysis unit regarding her son. If she develops erythema or infection would need consultation with hand service for treatment.

## 2014-01-29 ENCOUNTER — Ambulatory Visit (INDEPENDENT_AMBULATORY_CARE_PROVIDER_SITE_OTHER): Payer: Medicare Other

## 2014-01-29 DIAGNOSIS — I639 Cerebral infarction, unspecified: Secondary | ICD-10-CM

## 2014-01-29 DIAGNOSIS — Z5181 Encounter for therapeutic drug level monitoring: Secondary | ICD-10-CM

## 2014-01-29 DIAGNOSIS — Z7901 Long term (current) use of anticoagulants: Secondary | ICD-10-CM

## 2014-01-29 DIAGNOSIS — I6350 Cerebral infarction due to unspecified occlusion or stenosis of unspecified cerebral artery: Secondary | ICD-10-CM

## 2014-01-29 DIAGNOSIS — I4891 Unspecified atrial fibrillation: Secondary | ICD-10-CM

## 2014-01-29 LAB — POCT INR: INR: 1.5

## 2014-02-07 ENCOUNTER — Ambulatory Visit (INDEPENDENT_AMBULATORY_CARE_PROVIDER_SITE_OTHER): Payer: Medicare Other | Admitting: Pharmacist Clinician (PhC)/ Clinical Pharmacy Specialist

## 2014-02-07 DIAGNOSIS — Z7901 Long term (current) use of anticoagulants: Secondary | ICD-10-CM

## 2014-02-07 DIAGNOSIS — I639 Cerebral infarction, unspecified: Secondary | ICD-10-CM

## 2014-02-07 DIAGNOSIS — Z5181 Encounter for therapeutic drug level monitoring: Secondary | ICD-10-CM

## 2014-02-07 DIAGNOSIS — I4891 Unspecified atrial fibrillation: Secondary | ICD-10-CM

## 2014-02-07 DIAGNOSIS — I6350 Cerebral infarction due to unspecified occlusion or stenosis of unspecified cerebral artery: Secondary | ICD-10-CM

## 2014-02-07 LAB — POCT INR: INR: 1.4

## 2014-02-14 ENCOUNTER — Other Ambulatory Visit: Payer: Self-pay | Admitting: Interventional Cardiology

## 2014-02-19 ENCOUNTER — Ambulatory Visit: Payer: Medicare Other

## 2014-02-19 ENCOUNTER — Other Ambulatory Visit: Payer: Medicare Other

## 2014-02-19 ENCOUNTER — Other Ambulatory Visit: Payer: Self-pay | Admitting: Medical Oncology

## 2014-02-19 ENCOUNTER — Ambulatory Visit (INDEPENDENT_AMBULATORY_CARE_PROVIDER_SITE_OTHER): Payer: Medicare Other | Admitting: Pharmacist Clinician (PhC)/ Clinical Pharmacy Specialist

## 2014-02-19 DIAGNOSIS — Z7901 Long term (current) use of anticoagulants: Secondary | ICD-10-CM

## 2014-02-19 DIAGNOSIS — I6350 Cerebral infarction due to unspecified occlusion or stenosis of unspecified cerebral artery: Secondary | ICD-10-CM

## 2014-02-19 DIAGNOSIS — I4891 Unspecified atrial fibrillation: Secondary | ICD-10-CM

## 2014-02-19 DIAGNOSIS — I639 Cerebral infarction, unspecified: Secondary | ICD-10-CM

## 2014-02-19 DIAGNOSIS — Z5181 Encounter for therapeutic drug level monitoring: Secondary | ICD-10-CM

## 2014-02-19 LAB — POCT INR: INR: 3.2

## 2014-02-20 ENCOUNTER — Telehealth: Payer: Self-pay | Admitting: Internal Medicine

## 2014-02-20 NOTE — Telephone Encounter (Signed)
pt called back and expressed that she can not make m/w/f appt due to dialysis. r/s appt...pt ok and aware of new d.t

## 2014-02-20 NOTE — Telephone Encounter (Signed)
lvm for pt regarding to June appt....mailed pt appt sched and letter  for pt forJune

## 2014-03-01 ENCOUNTER — Telehealth: Payer: Self-pay

## 2014-03-01 NOTE — Telephone Encounter (Signed)
Phone call from pt.  Reported noted "a little bloody drainage" from mid- stump incision line of Left AKA.  Denies any redness or inflammation.  Denies any fever/ chills, or odor.  Requesting an appt. for evaluation.  Advised to clean area with Dial Soap, rinse well, and cover with clean, dry dressing; change daily, and prn to keep site clean/ dry.  Advised will call her back with appt. For stump check.  Verb. understanding.

## 2014-03-01 NOTE — Telephone Encounter (Signed)
Spoke with patient to schedule an appointment for 06/24 @ 12:20pm

## 2014-03-05 ENCOUNTER — Encounter: Payer: Self-pay | Admitting: Family

## 2014-03-06 ENCOUNTER — Encounter: Payer: Self-pay | Admitting: Family

## 2014-03-06 ENCOUNTER — Ambulatory Visit (INDEPENDENT_AMBULATORY_CARE_PROVIDER_SITE_OTHER): Payer: Medicare Other | Admitting: Family

## 2014-03-06 VITALS — BP 88/53 | HR 87 | Resp 16 | Ht <= 58 in | Wt 183.0 lb

## 2014-03-06 DIAGNOSIS — Z789 Other specified health status: Secondary | ICD-10-CM | POA: Insufficient documentation

## 2014-03-06 DIAGNOSIS — L24A9 Irritant contact dermatitis due friction or contact with other specified body fluids: Secondary | ICD-10-CM | POA: Insufficient documentation

## 2014-03-06 DIAGNOSIS — R234 Changes in skin texture: Secondary | ICD-10-CM

## 2014-03-06 DIAGNOSIS — T148XXA Other injury of unspecified body region, initial encounter: Secondary | ICD-10-CM

## 2014-03-06 NOTE — Progress Notes (Signed)
[facility] HISTORY AND PHYSICAL -PAD  History of Present Illness [name] is a 58 y.o. female patient of Dr. Donnetta Hutching who is s/p dril procedure on 11/21/2013. 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 She started using her stump liner on her right BKA stump to help reduce the swelling. She then developed a weep hole drainage site at a thin area of epithelium, draining serous drainage. No purulent drainage, no erythema. She has occasional pain in her right AKA stump, she is using her stump shrinker on this leg.  She states the right thumb necrotic tip is no worse, has some pain there. She dialyzes M-W-F, using the right upper arm HD access, she just came from dialysis.   Pt Diabetic: Yes Pt smoker: non-smoker  Past Medical History  Diagnosis Date  . Diabetes mellitus, type 2   . Secondary hyperparathyroidism   . Ischemic cardiomyopathy     a. EF 25-30% 2011;  b.  Echocardiogram (09/01/2013): EF 30-35%, diffuse hypokinesis, MAC, mild MR, mild LAE, mild RVE, severely reduced RVSF, moderate RAE, mildly increased PASP.  [name] Pulmonary hypertension     Est PAsyst 60-Dr Linard Millers, off coumadin, CT neg for PE  . Hyperlipidemia   . Anemia   . Thyroid disease   . Pulmonary edema   . Peripheral vascular disease     a. Prior LE amputations. b. s/p prior LE bypass surgery.  . Sebaceous cyst   . 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 Richardson Dopp  . Shortness of breath     with exertion  . Hemodialysis patient     M,W,F  . TIA (transient ischemic attack) June 2014  . Ulcer of heel due to diabetes     left  . Kidney disease Dialysis MWF    ESRD secondary to DM, failed transplant 2006. Dialysis  . Atrial fibrillation     a. Dx 08/2013, placed on amiodarone.  . Chronic systolic CHF (congestive heart failure)   . CAD (coronary artery disease)     a. s/p CABG 2011.;  b. Lexiscan Myoview (02/2013): Intermediate risk study; EF 50%, basal  inferolateral, basal anterolateral and mid inferolateral moderate ischemia; basal anterior, basal anteroseptal and mid anterior scar;  c. Type 2 NSTEMI during admission for LE revasc surgery 02/2013 in setting of SVT  . Polyneuropathy in diabetes(357.2) 10/31/2013  . Neuropathy   . OSA (obstructive sleep apnea)     uses c-papNPSG 11.23.11-AHI 64.9/hr-central and obstructive apnea    Social History History  Substance Use Topics  . Smoking status: Never Smoker   . Smokeless tobacco: Never Used  . Alcohol Use: No    Family History Family History  Problem Relation Age of Onset  . Heart disease    . Edema Father     fluid overload  . Diabetes Father   . Cerebral aneurysm Mother   . Diabetes Mother   . Stroke Brother   . Heart disease Brother   . Diabetes Brother   . Hyperlipidemia Brother   . Hypertension Brother   . Peripheral vascular disease Brother     amputation    Past Surgical History  Procedure Laterality Date  . Coronary artery bypass graft  2011    3V  . Insertion of dialysis catheter    . Above knee leg amputation Left     infection/gangrene 10/11/07  . Nasal mucosal biopsy    . Ovarian cyst surgery    .  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 Laser retinal surgery  2011  . Kidney transplant      rejection  . Abdominal angiogram Left 01/25/13  . Colonoscopy    . Cardiac catheterization    . Eye surgery    . Femoral-popliteal bypass graft Left 02/20/2013    Procedure:  FEMORAL-to below knee POPLITEAL ARTERY bypasswith saphenous vein with intraoperative angiogram ;  Surgeon: Angelia Mould, MD;  Location: [facility];  Service: Vascular;  Laterality: Left;  fem-below knee pop  . Amputation Left 03/27/2013    Procedure: AMPUTATION ABOVE KNEE;  Surgeon: Angelia Mould, MD;  Location: [place];  Service: Vascular;  Laterality: Left;  . Tee without cardioversion N/A 09/11/2013    Procedure: TRANSESOPHAGEAL ECHOCARDIOGRAM (TEE);   Surgeon: Casandra Doffing, MD;  Location: [facility] ENDOSCOPY;  Service: Cardiovascular;  Laterality: N/A;  . Cardioversion N/A 09/11/2013    Procedure: CARDIOVERSION;  Surgeon: Casandra Doffing, MD;  Location[facility];  Service: Cardiovascular;  Laterality: N/A;  . Below knee leg amputation Right   . Revison of arteriovenous fistula Right 11/21/2013    Procedure: REVISON OF ARTERIOVENOUS FISTULA;  Surgeon: Rosetta Posner, MD;  Location: [facility] OR;  Service: Vascular;  Laterality: Right;  . Cataract surgery Right 01-07-2014    Allergies  Allergen Reactions  . Neurontin [Gabapentin] Anaphylaxis  . Ezetimibe-Simvastatin Other (See Comments)    REACTION: face edema; itching  . Klonopin [Clonazepam] Other (See Comments)    hallucination  . Sertraline Hcl Other (See Comments)    hallucination    Current Outpatient Prescriptions  Medication Sig Dispense Refill  . acetaminophen (TYLENOL) 325 MG tablet Take 650-975 mg by mouth daily as needed (pain).       [name] amiodarone (PACERONE) 200 MG tablet Take 1 tablet (200 mg total) by mouth daily.  30 tablet  11  . aspirin EC 81 MG tablet Take 81 mg by mouth daily.      [name] atorvastatin (LIPITOR) 10 MG tablet Take 10 mg by mouth daily before breakfast.       . B Complex-C-Folic Acid (DIALYVITE PO) Take 1 tablet by mouth daily.       . cinacalcet (SENSIPAR) 60 MG tablet Take 60 mg by mouth daily with lunch.       . hydrOXYzine (ATARAX/VISTARIL) 25 MG tablet Take 25 mg by mouth every 6 (six) hours as needed for itching.       . insulin aspart (NOVOLOG FLEXPEN) 100 UNIT/ML FlexPen Inject 3-15 Units into the skin 3 (three) times daily with meals. Per SSI      . megestrol (MEGACE) 40 MG/ML suspension continuous as needed.      . midodrine (PROAMATINE) 10 MG tablet Take 10 mg by mouth daily.       Vladimir Faster Glycol-Propyl Glycol (SYSTANE OP) Apply 1 drop to eye 3 (three) times daily as needed (for dry eyes).      . pregabalin (LYRICA) 25 MG capsule Take 25 mg by mouth 2  (two) times daily.      . sevelamer carbonate (RENVELA) 800 MG tablet Take 1,600 mg by mouth 3 (three) times daily with meals.      . traMADol (ULTRAM) 50 MG tablet as needed.      . warfarin (COUMADIN) 4 MG tablet 1 tablet daily except 1.5 tablets each Monday, Wednesday and Friday or as directed by coumadin clinic  40 tablet  3   No current facility-administered medications for  this visit.    ROS: See HPI for pertinent positives and negatives.   Physical Examination  Filed Vitals:   03/06/14 1222  BP: 88/53  Pulse: 87  Resp: 16    General: A&O x 3, WDWN. Gait: in her motorized wheelchair Eyes: No gross abnormalities  Pulmonary: Respirations are non labored Cardiac: regular Rhythm. Right upper arm access has a palpable thrill.        Radial pulses: 1+ palpable bilaterally.                           VASCULAR EXAM: Extremities with ischemic changes necrotic tip of right thumb, no signs of infection, no swelling, no drainage  without Gangrene; with pinhole weep wound at right BKA stump, draining small amount serous drainage.                                                                                                          LE Pulses LEFT RIGHT       FEMORAL  not palpable  not palpable        POPLITEAL AKA   not palpable       POSTERIOR TIBIAL AKA   BKA        DORSALIS PEDIS      ANTERIOR TIBIAL AKA BKA    Abdomen: soft, NT, no masses. Skin: no rashes,see extremities Musculoskeletal: right BKA, left AKA  Neurologic: A&O X 3; Appropriate Affect ; SENSATION: normal; MOTOR FUNCTION:  moving all extremities equally, motor strength 4/5 throughout. Speech is fluent/normal. CN 2-12 grossly intact.  ASSESSMENT: SHANIEKA BLEA is a 58 y.o. female who is s/p dril procedure on 11/21/2013. 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 She developed a weeping hole wound from a thin part of the epithelium at her right BKA stump after using her stump liner to  help reduce the swelling in her stump.  No signs of infection. Do not use stump liner until right stump wound completely healed. Daily dressing changes, see Plan.  PLAN:  Bacitracin, sterile gauze, Kerlex, and ace wrap daily dressing to right BKA stump wound. Return as needed.  Clemon Chambers, RN, MSN, FNP-C [facility] Phone: [phone]  Clinic MD: Scot Dock  03/06/2014 12:36 PM

## 2014-03-11 ENCOUNTER — Ambulatory Visit: Payer: Medicare Other

## 2014-03-11 ENCOUNTER — Other Ambulatory Visit: Payer: Medicare Other

## 2014-03-12 ENCOUNTER — Ambulatory Visit (INDEPENDENT_AMBULATORY_CARE_PROVIDER_SITE_OTHER): Payer: Medicare Other | Admitting: Pharmacist

## 2014-03-12 DIAGNOSIS — Z5181 Encounter for therapeutic drug level monitoring: Secondary | ICD-10-CM

## 2014-03-12 DIAGNOSIS — Z7901 Long term (current) use of anticoagulants: Secondary | ICD-10-CM

## 2014-03-12 DIAGNOSIS — I6350 Cerebral infarction due to unspecified occlusion or stenosis of unspecified cerebral artery: Secondary | ICD-10-CM

## 2014-03-12 DIAGNOSIS — I4891 Unspecified atrial fibrillation: Secondary | ICD-10-CM

## 2014-03-12 DIAGNOSIS — I639 Cerebral infarction, unspecified: Secondary | ICD-10-CM

## 2014-03-12 LAB — POCT INR: INR: 2.8

## 2014-03-26 ENCOUNTER — Other Ambulatory Visit (HOSPITAL_BASED_OUTPATIENT_CLINIC_OR_DEPARTMENT_OTHER): Payer: Medicare Other

## 2014-03-26 ENCOUNTER — Encounter: Payer: Self-pay | Admitting: Internal Medicine

## 2014-03-26 ENCOUNTER — Telehealth: Payer: Self-pay | Admitting: Internal Medicine

## 2014-03-26 ENCOUNTER — Ambulatory Visit (HOSPITAL_BASED_OUTPATIENT_CLINIC_OR_DEPARTMENT_OTHER): Payer: Medicare Other | Admitting: Internal Medicine

## 2014-03-26 VITALS — BP 121/69 | HR 81 | Temp 98.1°F | Resp 18 | Ht <= 58 in

## 2014-03-26 DIAGNOSIS — I4891 Unspecified atrial fibrillation: Secondary | ICD-10-CM

## 2014-03-26 DIAGNOSIS — N186 End stage renal disease: Secondary | ICD-10-CM

## 2014-03-26 DIAGNOSIS — I739 Peripheral vascular disease, unspecified: Secondary | ICD-10-CM

## 2014-03-26 DIAGNOSIS — D472 Monoclonal gammopathy: Secondary | ICD-10-CM

## 2014-03-26 DIAGNOSIS — E119 Type 2 diabetes mellitus without complications: Secondary | ICD-10-CM

## 2014-03-26 DIAGNOSIS — D61818 Other pancytopenia: Secondary | ICD-10-CM

## 2014-03-26 DIAGNOSIS — D631 Anemia in chronic kidney disease: Secondary | ICD-10-CM

## 2014-03-26 DIAGNOSIS — N189 Chronic kidney disease, unspecified: Secondary | ICD-10-CM

## 2014-03-26 DIAGNOSIS — E876 Hypokalemia: Secondary | ICD-10-CM

## 2014-03-26 LAB — COMPREHENSIVE METABOLIC PANEL (CC13)
ALT: 25 U/L (ref 0–55)
ANION GAP: 15 meq/L — AB (ref 3–11)
AST: 27 U/L (ref 5–34)
Albumin: 2.2 g/dL — ABNORMAL LOW (ref 3.5–5.0)
Alkaline Phosphatase: 164 U/L — ABNORMAL HIGH (ref 40–150)
BILIRUBIN TOTAL: 0.51 mg/dL (ref 0.20–1.20)
BUN: 39.1 mg/dL — ABNORMAL HIGH (ref 7.0–26.0)
CALCIUM: 7.4 mg/dL — AB (ref 8.4–10.4)
CO2: 29 meq/L (ref 22–29)
CREATININE: 5.4 mg/dL — AB (ref 0.6–1.1)
Chloride: 97 mEq/L — ABNORMAL LOW (ref 98–109)
GLUCOSE: 153 mg/dL — AB (ref 70–140)
Potassium: 3.2 mEq/L — ABNORMAL LOW (ref 3.5–5.1)
Sodium: 140 mEq/L (ref 136–145)
Total Protein: 6.7 g/dL (ref 6.4–8.3)

## 2014-03-26 LAB — CBC WITH DIFFERENTIAL/PLATELET
BASO%: 1 % (ref 0.0–2.0)
BASOS ABS: 0 10*3/uL (ref 0.0–0.1)
EOS ABS: 0.1 10*3/uL (ref 0.0–0.5)
EOS%: 3.7 % (ref 0.0–7.0)
HCT: 33.9 % — ABNORMAL LOW (ref 34.8–46.6)
HGB: 10.9 g/dL — ABNORMAL LOW (ref 11.6–15.9)
LYMPH%: 19.4 % (ref 14.0–49.7)
MCH: 27.5 pg (ref 25.1–34.0)
MCHC: 32.2 g/dL (ref 31.5–36.0)
MCV: 85.6 fL (ref 79.5–101.0)
MONO#: 0.6 10*3/uL (ref 0.1–0.9)
MONO%: 15.7 % — ABNORMAL HIGH (ref 0.0–14.0)
NEUT%: 60.2 % (ref 38.4–76.8)
NEUTROS ABS: 2.3 10*3/uL (ref 1.5–6.5)
PLATELETS: 93 10*3/uL — AB (ref 145–400)
RBC: 3.96 10*6/uL (ref 3.70–5.45)
RDW: 17.4 % — ABNORMAL HIGH (ref 11.2–14.5)
WBC: 3.8 10*3/uL — ABNORMAL LOW (ref 3.9–10.3)
lymph#: 0.7 10*3/uL — ABNORMAL LOW (ref 0.9–3.3)

## 2014-03-26 LAB — LACTATE DEHYDROGENASE (CC13): LDH: 244 U/L (ref 125–245)

## 2014-03-26 NOTE — Telephone Encounter (Signed)
Pt confirmed labs/ov per 07/14 POF gave pt AVS...Marland KitchenMarland KitchenKJ

## 2014-03-26 NOTE — Progress Notes (Signed)
Hematology and Oncology Follow Up Visit  [name] [id number] 1955/10/17 58 y.o. 03/26/2014 12:48 PM  DETERDING,JAMES L, MD  Principle Diagnosis: MGUS  Prior Therapy:  None  Current therapy: None  Interim History:  [name] was last seen 05/24/2013 by me. She also has type 2 diabetes, end-stage renal disease for which she receives dialysis 3 days a week. Her lab results which note the following:  On 07/18/2012 hemoglobin and hematocrit 12.4 and 37.9 respectively; platelets 109; IgG 3130, IgA 200, IgM 27; total protein 7.6; kappa free light chain 61.4.  Lambda free light chain 17.7.  Kappa lambda ratio 3.47.  M spike 1.63 g/dL.  Bone survey on 07/25/2012 showed no evidence of blastic or lytic lesions.  She had a left BKA due to wound dehescience and bacteremia on 03/2013.     Today, she reports feeling well overall.  She has had two hospitalizations that she reports.  One for cardiac arrhythmia (Atrial fibrillation with RVR on 12/27 - 09/18/2013) and one for right hand ischemia right upper arm AV fistula due steal syndrome of right arm.  She had revision of AV Fistula on 11/21/2013 by Dr. Donnetta Hutching.  She was followed by surgery late last month.   Medications: I have reviewed the patient's current medications.  Current Outpatient Prescriptions  Medication Sig Dispense Refill  . acetaminophen (TYLENOL) 325 MG tablet Take 650-975 mg by mouth daily as needed (pain).       [name] amiodarone (PACERONE) 200 MG tablet Take 1 tablet (200 mg total) by mouth daily.  30 tablet  11  . aspirin EC 81 MG tablet Take 81 mg by mouth daily.      [name] atorvastatin (LIPITOR) 10 MG tablet Take 10 mg by mouth daily before breakfast.       . B Complex-C-Folic Acid (DIALYVITE PO) Take 1 tablet by mouth daily.       . cinacalcet (SENSIPAR) 60 MG tablet Take 60 mg by mouth daily with lunch.       . hydrOXYzine (ATARAX/VISTARIL) 25 MG tablet Take 25 mg by mouth every 6 (six) hours as needed for itching.       . insulin aspart  (NOVOLOG FLEXPEN) 100 UNIT/ML FlexPen Inject 3-15 Units into the skin 3 (three) times daily with meals. Per SSI      . megestrol (MEGACE) 40 MG/ML suspension continuous as needed.      . midodrine (PROAMATINE) 10 MG tablet Take 10 mg by mouth daily.       Vladimir Faster Glycol-Propyl Glycol (SYSTANE OP) Apply 1 drop to eye 3 (three) times daily as needed (for dry eyes).      . pregabalin (LYRICA) 25 MG capsule Take 25 mg by mouth 2 (two) times daily.      . sevelamer carbonate (RENVELA) 800 MG tablet Take 1,600 mg by mouth 3 (three) times daily with meals. Does not take 3 times a day. Sometimes once or twice      . traMADol (ULTRAM) 50 MG tablet as needed.      . warfarin (COUMADIN) 4 MG tablet 1 tablet daily except 1.5 tablets each Monday, Wednesday and Friday or as directed by coumadin clinic  40 tablet  3   No current facility-administered medications for this visit.   Allergies:  Allergies  Allergen Reactions  . Neurontin [Gabapentin] Anaphylaxis  . Ezetimibe-Simvastatin Other (See Comments)    REACTION: face edema; itching  . Klonopin [Clonazepam] Other (See Comments)    hallucination  .  Sertraline Hcl Other (See Comments)    hallucination    Past Medical History, Surgical history, Social history, and Family History were reviewed and updated.  Review of Systems: Constitutional:  Negative for fever, chills, night sweats, anorexia, weight loss, pain. Cardiovascular: no chest pain or dyspnea on exertion Respiratory: no cough, shortness of breath, or wheezing Neurological: no TIA or stroke symptoms Dermatological: negative for rash ENT: negative for - epistaxis Skin: Negative. Gastrointestinal: no abdominal pain, change in bowel habits, or black or bloody stools Genito-Urinary: no dysuria, trouble voiding, or hematuria  On dialysis Hematological and Lymphatic: negative for - blood clots Breast: negative for breast lumps Musculoskeletal: negative for - joint stiffness Remaining ROS  negative. Physical Exam: Blood pressure 121/69, pulse 81, temperature 98.1 F (36.7 C), temperature source Oral, resp. rate 18, height 4\' 8"  (1.422 m), weight 0 lb (0 kg), SpO2 100.00%. ECOG:  2 General appearance: alert, cooperative, appears stated age and no distress  She has bilateral BKA; sitting in wheelchair Head: Normocephalic, without obvious abnormality, atraumatic Neck: no adenopathy, supple, symmetrical, trachea midline and thyroid not enlarged, symmetric, no tenderness/mass/nodules Lymph nodes: Cervical adenopathy: None Heart:regular rate and rhythm, S1, S2 normal, no murmur, click, rub or gallop Lung:chest clear, no wheezing, rales, normal symmetric air entry, Heart exam - S1, S2 normal, no murmur, no gallop, rate regular Abdomin: S/NT/ND + BS EXT: Bilateral BKA with prothesis on the right.    Lab Results: Lab Results  Component Value Date   WBC 3.8* 03/26/2014   HGB 10.9* 03/26/2014   HCT 33.9* 03/26/2014   MCV 85.6 03/26/2014   PLT 93* 03/26/2014     Chemistry      Component Value Date/Time   NA 140 03/26/2014 0907   NA 140 11/21/2013 0902   K 3.2* 03/26/2014 0907   K 3.1* 11/21/2013 0902   CL 95* 09/17/2013 2152   CL 98 07/11/2012 1427   CO2 29 03/26/2014 0907   CO2 23 09/17/2013 2152   BUN 39.1* 03/26/2014 0907   BUN 27* 09/17/2013 2152   CREATININE 5.4* 03/26/2014 0907   CREATININE 4.99* 09/17/2013 2152      Component Value Date/Time   CALCIUM 7.4* 03/26/2014 0907   CALCIUM 7.4* 09/17/2013 2152   ALKPHOS 164* 03/26/2014 0907   ALKPHOS 114 09/15/2013 0400   AST 27 03/26/2014 0907   AST 39* 09/15/2013 0400   ALT 25 03/26/2014 0907   ALT 31 09/15/2013 0400   BILITOT 0.51 03/26/2014 0907   BILITOT 0.7 09/15/2013 0400     Radiological Studies: No results found.   Impression and Plan:  1. IgG MGUS, stable. -- There is a 1% chance of this transferring to myeloma per year and therefore annual follow up is preferred. -- RTC in 6 months with CBC, CMP, SPEP and Kappa/Lambda   2.  Pancytopenia NOS. --Her ANC is 1,000; plts of 93 and hemoglobin of 10.9.  We counseled her extensively on reporting fevers and symptoms of anemia.  We will repeat labs in 3 months to establish trend and upon return visit in 6 months.  ESA with dialysis.   3. Hypokalemia. --Potassium is 3.2.  She is not on diuretics.  We will forward labs to her nephrology as well.  Continue potassium-rich diet in the setting of her dialysis.  4. ESRDz  --Continue H/d on M/W/F. Creatinine 5.4.   5. Atrial Fibrillation with RVR. --On coumadin with goal INR between 2-3.  Continue amiodarone 200 mg daily.   6. S/p  Bilateral TKA.  7. DM2/PVDz. --Continue novolog 3-15 units tid with meals; continue renvela 1,600 mg tid with meals; Continue lyrica 25 bid.  --Continue aspirin 81 mg daily and atorvastain 10 mg daily.   Spent more than half the time coordinating care.    Caelynn Marshman, MD 03/26/2014 12:48 PM

## 2014-03-28 LAB — SPEP & IFE WITH QIG
Albumin ELP: 46.4 % — ABNORMAL LOW (ref 55.8–66.1)
Alpha-1-Globulin: 5.5 % — ABNORMAL HIGH (ref 2.9–4.9)
Alpha-2-Globulin: 10.8 % (ref 7.1–11.8)
BETA 2: 4 % (ref 3.2–6.5)
Beta Globulin: 4.2 % — ABNORMAL LOW (ref 4.7–7.2)
GAMMA GLOBULIN: 29.1 % — AB (ref 11.1–18.8)
IgA: 125 mg/dL (ref 69–380)
IgG (Immunoglobin G), Serum: 1800 mg/dL — ABNORMAL HIGH (ref 690–1700)
IgM, Serum: 19 mg/dL — ABNORMAL LOW (ref 52–322)
M-SPIKE, %: 1.2 g/dL
Total Protein, Serum Electrophoresis: 6.2 g/dL (ref 6.0–8.3)

## 2014-03-28 LAB — KAPPA/LAMBDA LIGHT CHAINS
Kappa free light chain: 59.6 mg/dL — ABNORMAL HIGH (ref 0.33–1.94)
Kappa:Lambda Ratio: 3.92 — ABNORMAL HIGH (ref 0.26–1.65)
Lambda Free Lght Chn: 15.2 mg/dL — ABNORMAL HIGH (ref 0.57–2.63)

## 2014-04-02 ENCOUNTER — Ambulatory Visit (INDEPENDENT_AMBULATORY_CARE_PROVIDER_SITE_OTHER): Payer: Medicare Other

## 2014-04-02 DIAGNOSIS — I4891 Unspecified atrial fibrillation: Secondary | ICD-10-CM

## 2014-04-02 DIAGNOSIS — Z7901 Long term (current) use of anticoagulants: Secondary | ICD-10-CM

## 2014-04-02 DIAGNOSIS — Z5181 Encounter for therapeutic drug level monitoring: Secondary | ICD-10-CM

## 2014-04-02 DIAGNOSIS — I639 Cerebral infarction, unspecified: Secondary | ICD-10-CM

## 2014-04-02 DIAGNOSIS — I6350 Cerebral infarction due to unspecified occlusion or stenosis of unspecified cerebral artery: Secondary | ICD-10-CM

## 2014-04-02 LAB — POCT INR: INR: 2.8

## 2014-04-30 ENCOUNTER — Ambulatory Visit (INDEPENDENT_AMBULATORY_CARE_PROVIDER_SITE_OTHER): Payer: Medicare Other | Admitting: *Deleted

## 2014-04-30 DIAGNOSIS — Z7901 Long term (current) use of anticoagulants: Secondary | ICD-10-CM

## 2014-04-30 DIAGNOSIS — Z5181 Encounter for therapeutic drug level monitoring: Secondary | ICD-10-CM

## 2014-04-30 DIAGNOSIS — I639 Cerebral infarction, unspecified: Secondary | ICD-10-CM

## 2014-04-30 DIAGNOSIS — I6350 Cerebral infarction due to unspecified occlusion or stenosis of unspecified cerebral artery: Secondary | ICD-10-CM

## 2014-04-30 DIAGNOSIS — I4891 Unspecified atrial fibrillation: Secondary | ICD-10-CM

## 2014-04-30 LAB — POCT INR: INR: 2.4

## 2014-05-28 ENCOUNTER — Encounter (INDEPENDENT_AMBULATORY_CARE_PROVIDER_SITE_OTHER): Payer: Medicare Other

## 2014-05-28 DIAGNOSIS — I4891 Unspecified atrial fibrillation: Secondary | ICD-10-CM

## 2014-05-28 DIAGNOSIS — I6350 Cerebral infarction due to unspecified occlusion or stenosis of unspecified cerebral artery: Secondary | ICD-10-CM

## 2014-05-28 DIAGNOSIS — Z7901 Long term (current) use of anticoagulants: Secondary | ICD-10-CM

## 2014-05-30 ENCOUNTER — Ambulatory Visit (INDEPENDENT_AMBULATORY_CARE_PROVIDER_SITE_OTHER): Payer: Medicare Other | Admitting: Pharmacist

## 2014-05-30 DIAGNOSIS — I4891 Unspecified atrial fibrillation: Secondary | ICD-10-CM

## 2014-05-30 DIAGNOSIS — Z5181 Encounter for therapeutic drug level monitoring: Secondary | ICD-10-CM

## 2014-05-30 LAB — POCT INR: INR: 3.3

## 2014-06-04 ENCOUNTER — Telehealth: Payer: Self-pay

## 2014-06-04 NOTE — Telephone Encounter (Signed)
Phone call from pt.  Reported having a blister open-up on site of left AKA stump, over the weekend.  Reported there is an open area approx. size of half dollar; draining a thin, bloody drainage.  Denies fever / chills.  Appt. given for 06/05/14 @ 2:30 PM.  Agrees with plan.

## 2014-06-05 ENCOUNTER — Ambulatory Visit (INDEPENDENT_AMBULATORY_CARE_PROVIDER_SITE_OTHER): Payer: Medicare Other | Admitting: Family

## 2014-06-05 ENCOUNTER — Encounter: Payer: Self-pay | Admitting: Family

## 2014-06-05 ENCOUNTER — Ambulatory Visit: Payer: Medicare Other | Admitting: Family

## 2014-06-05 VITALS — BP 97/58 | HR 73 | Temp 99.3°F | Resp 16 | Ht <= 58 in | Wt 185.2 lb

## 2014-06-05 DIAGNOSIS — S1092XA Blister (nonthermal) of unspecified part of neck, initial encounter: Secondary | ICD-10-CM

## 2014-06-05 DIAGNOSIS — S0002XA Blister (nonthermal) of scalp, initial encounter: Secondary | ICD-10-CM

## 2014-06-05 DIAGNOSIS — S0092XA Blister (nonthermal) of unspecified part of head, initial encounter: Secondary | ICD-10-CM | POA: Insufficient documentation

## 2014-06-05 DIAGNOSIS — I2510 Atherosclerotic heart disease of native coronary artery without angina pectoris: Secondary | ICD-10-CM

## 2014-06-05 DIAGNOSIS — S0082XA Blister (nonthermal) of other part of head, initial encounter: Secondary | ICD-10-CM

## 2014-06-05 NOTE — Progress Notes (Signed)
[facility] [place] HISTORY AND PHYSICAL -PAD  History of Present Illness [name] is a 58 y.o. female patient of Dr. Donnetta Hutching who is s/p dril procedure on 11/21/2013.  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  She returns today for C/O intermittent recurrence of blisters on right stump since October, 2014 and pain.  [facility] was also performing right stump dressing changes until she was discharged from the wound care center. She had been going to the wound care center and was discharged from there April or May, 2015. The kidney center gave her a prescription for 5 days of doxycycline which she started yesterday. Pt denies any fever or chills. Pt report no changes in her meds or soap use or different local exposures starting October, 2015. Pt states the wound center did not know what caused her blisters, she has not been using the stump shrinker whenever she has blisters on her stump.  She states the right thumb necrotic tip is no worse, has some pain there.  She dialyzes M-W-F, using the right upper arm HD access. She has tingling and numbness in both hands, more so in the right hand.  Pt Diabetic: Yes  Pt smoker: non-smoker  Pt meds include: Statin :Yes ASA: Yes Other anticoagulants/antiplatelets: coumadin  Past Medical History  Diagnosis Date  . Diabetes mellitus, type 2   . Secondary hyperparathyroidism   . Ischemic cardiomyopathy     a. EF 25-30% 2011;  b.  Echocardiogram (09/01/2013): EF 30-35%, diffuse hypokinesis, MAC, mild MR, mild LAE, mild RVE, severely reduced RVSF, moderate RAE, mildly increased PASP.  [name] Pulmonary hypertension     Est PAsyst 60-Dr Linard Millers, off coumadin, CT neg for PE  . Hyperlipidemia   . Anemia   . Thyroid disease   . Pulmonary edema   . Peripheral vascular disease     a. Prior LE amputations. b. s/p prior LE bypass surgery.  . Sebaceous cyst   . 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  Richardson Dopp  . Shortness of breath     with exertion  . Hemodialysis patient     M,W,F  . TIA (transient ischemic attack) June 2014  . Ulcer of heel due to diabetes     left  . Kidney disease Dialysis MWF    ESRD secondary to DM, failed transplant 2006. Dialysis  . Atrial fibrillation     a. Dx 08/2013, placed on amiodarone.  . Chronic systolic CHF (congestive heart failure)   . CAD (coronary artery disease)     a. s/p CABG 2011.;  b. Lexiscan Myoview (02/2013): Intermediate risk study; EF 50%, basal inferolateral, basal anterolateral and mid inferolateral moderate ischemia; basal anterior, basal anteroseptal and mid anterior scar;  c. Type 2 NSTEMI during admission for LE revasc surgery 02/2013 in setting of SVT  . Polyneuropathy in diabetes(357.2) 10/31/2013  . Neuropathy   . OSA (obstructive sleep apnea)     uses c-papNPSG 11.23.11-AHI 64.9/hr-central and obstructive apnea    Social History History  Substance Use Topics  . Smoking status: Never Smoker   . Smokeless tobacco: Never Used  . Alcohol Use: No    Family History Family History  Problem Relation Age of Onset  . Heart disease    . Edema Father     fluid overload  . Diabetes Father   . Cerebral aneurysm Mother   . Diabetes Mother   . Stroke Brother   .  Heart disease Brother   . Diabetes Brother   . Hyperlipidemia Brother   . Hypertension Brother   . Peripheral vascular disease Brother     amputation    Past Surgical History  Procedure Laterality Date  . Coronary artery bypass graft  2011    3V  . Insertion of dialysis catheter    . Above knee leg amputation Left     infection/gangrene 10/11/07  . Nasal mucosal biopsy    . Ovarian cyst surgery    . 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 Laser retinal surgery  2011  . Kidney transplant      rejection  . Abdominal angiogram Left 01/25/13  . Colonoscopy    . Cardiac catheterization    . Eye surgery    .  Femoral-popliteal bypass graft Left 02/20/2013    Procedure:  FEMORAL-to below knee POPLITEAL ARTERY bypasswith saphenous vein with intraoperative angiogram ;  Surgeon: Angelia Mould, MD;  Location: [facility];  Service: Vascular;  Laterality: Left;  fem-below knee pop  . Amputation Left 03/27/2013    Procedure: AMPUTATION ABOVE KNEE;  Surgeon: Angelia Mould, MD;  Location: [facility];  Service: Vascular;  Laterality: Left;  . Tee without cardioversion N/A 09/11/2013    Procedure: TRANSESOPHAGEAL ECHOCARDIOGRAM (TEE);  Surgeon: Casandra Doffing, MD;  Location: [facility] ENDOSCOPY;  Service: Cardiovascular;  Laterality: N/A;  . Cardioversion N/A 09/11/2013    Procedure: CARDIOVERSION;  Surgeon: Casandra Doffing, MD;  Location: [facility] ENDOSCOPY;  Service: Cardiovascular;  Laterality: N/A;  . Below knee leg amputation Right   . Revison of arteriovenous fistula Right 11/21/2013    Procedure: REVISON OF ARTERIOVENOUS FISTULA;  Surgeon: Rosetta Posner, MD;  Location: [facility] OR;  Service: Vascular;  Laterality: Right;  . Cataract surgery Right 01-07-2014    Allergies  Allergen Reactions  . Neurontin [Gabapentin] Anaphylaxis  . Ezetimibe-Simvastatin Other (See Comments)    REACTION: face edema; itching  . Klonopin [Clonazepam] Other (See Comments)    hallucination  . Sertraline Hcl Other (See Comments)    hallucination    Current Outpatient Prescriptions  Medication Sig Dispense Refill  . acetaminophen (TYLENOL) 325 MG tablet Take 650-975 mg by mouth daily as needed (pain).       [name] amiodarone (PACERONE) 200 MG tablet Take 1 tablet (200 mg total) by mouth daily.  30 tablet  11  . aspirin EC 81 MG tablet Take 81 mg by mouth daily.      [name] atorvastatin (LIPITOR) 10 MG tablet Take 10 mg by mouth daily before breakfast.       . B Complex-C-Folic Acid (DIALYVITE PO) Take 1 tablet by mouth daily.       . cinacalcet (SENSIPAR) 60 MG tablet Take 60 mg by mouth daily with lunch.       . doxycycline (VIBRAMYCIN) 100 MG  capsule 2 (two) times daily. For blister on right leg      . hydrOXYzine (ATARAX/VISTARIL) 25 MG tablet Take 25 mg by mouth every 6 (six) hours as needed for itching.       . insulin aspart (NOVOLOG FLEXPEN) 100 UNIT/ML FlexPen Inject 3-15 Units into the skin 3 (three) times daily with meals. Per SSI      . megestrol (MEGACE) 40 MG/ML suspension continuous as needed.      . midodrine (PROAMATINE) 10 MG tablet Take 10 mg by mouth daily.       Vladimir Faster Glycol-Propyl Glycol (SYSTANE OP) Apply  1 drop to eye 3 (three) times daily as needed (for dry eyes).      . pregabalin (LYRICA) 25 MG capsule Take 25 mg by mouth 2 (two) times daily.      . sevelamer carbonate (RENVELA) 800 MG tablet Take 500 mg by mouth 3 (three) times daily with meals. Does not take 3 times a day. Sometimes once or twice      . traMADol (ULTRAM) 50 MG tablet as needed.      . warfarin (COUMADIN) 4 MG tablet 1 tablet daily except 1.5 tablets each Monday, Wednesday and Friday or as directed by coumadin clinic  40 tablet  3   No current facility-administered medications for this visit.    ROS: See HPI for pertinent positives and negatives.   Physical Examination  Filed Vitals:   06/05/14 1454  BP: 97/58  Pulse: 73  Temp: 99.3 F (37.4 C)  TempSrc: Oral  Resp: 16  Height: 4\' 8"  (1.422 m)  Weight: 185 lb 3 oz (84 kg)  SpO2: 100%   Body mass index is 41.54 kg/(m^2).  General: A&O x 3, WDWN.  Gait: in her motorized wheelchair  Eyes: No gross abnormalities  Pulmonary: Respirations are non labored  Cardiac: regular Rhythm. Right upper arm access has a palpable thrill.  Radial pulses: 1+ palpable bilaterally.   VASCULAR EXAM:  Extremities with ischemic changes necrotic tip of right thumb, no signs of infection, no swelling, no drainage  without Gangrene; with a moderate amount of serous drainage from open shallow blister at right BKA stump, small fluid filled closed blister at right patellar area. Red viable tissue  at open blister area, no surrounding erythema, no purulent drainage.   LE Pulses  LEFT  RIGHT   FEMORAL  not palpable  not palpable   POPLITEAL  AKA  not palpable   POSTERIOR TIBIAL  AKA  BKA   DORSALIS PEDIS  ANTERIOR TIBIAL  AKA  BKA    Abdomen: soft, NT, no palpated masses.  Skin: no rashes,see extremities  Musculoskeletal: right BKA, left AKA  Neurologic: A&O X 3; Appropriate Affect ; SENSATION: normal; MOTOR FUNCTION: moving all extremities equally, motor strength 4/5 throughout. Speech is fluent/normal. CN 2-12 grossly intact.   ASSESSMENT: VANTASIA PINKNEY is a 58 y.o. female who presents with intermittent blister formation at right BKA stump for about a year.  She does have a low grade fever of 99.3, but the open blister on the right BKA stump has no signs of infection. Etiology of blister formation is unknown but both of her stumps seem well perfused with no ischemia.  PLAN:  Resume wound care center management for the blisters forming on her right BKA stump; pt states she has an appointment already at the wound care center for June 27, 2014. Continue home dressing changes to right BKA stump: wash daily with soap and water, apply hydrogel, sterile gauze, kerlex wrap, and ace wrap or breathable stocking dressing. Finish the doxycycline 5 day course.  Based on the patient's vascular studies and examination, pt will return to clinic as needed.  The patient was given information about PAD including signs, symptoms, treatment, what symptoms should prompt the patient to seek immediate medical care, and risk reduction measures to take.  Clemon Chambers, RN, MSN, FNP-C [facility] Phone: [phone]  Clinic MD: Scot Dock  06/05/2014 3:07 PM

## 2014-06-20 ENCOUNTER — Ambulatory Visit (INDEPENDENT_AMBULATORY_CARE_PROVIDER_SITE_OTHER): Payer: Medicare Other

## 2014-06-20 DIAGNOSIS — Z7901 Long term (current) use of anticoagulants: Secondary | ICD-10-CM

## 2014-06-20 DIAGNOSIS — I639 Cerebral infarction, unspecified: Secondary | ICD-10-CM

## 2014-06-20 DIAGNOSIS — I4891 Unspecified atrial fibrillation: Secondary | ICD-10-CM

## 2014-06-20 DIAGNOSIS — Z5181 Encounter for therapeutic drug level monitoring: Secondary | ICD-10-CM

## 2014-06-20 LAB — POCT INR: INR: 2.6

## 2014-06-27 ENCOUNTER — Encounter (HOSPITAL_BASED_OUTPATIENT_CLINIC_OR_DEPARTMENT_OTHER): Payer: Medicare Other | Attending: Internal Medicine

## 2014-06-27 ENCOUNTER — Other Ambulatory Visit: Payer: Self-pay | Admitting: Medical Oncology

## 2014-06-27 ENCOUNTER — Other Ambulatory Visit: Payer: Medicare Other

## 2014-06-28 ENCOUNTER — Telehealth: Payer: Self-pay | Admitting: Hematology

## 2014-06-28 NOTE — Telephone Encounter (Signed)
lvm for pt regarding to OCT appt..... °

## 2014-07-02 ENCOUNTER — Ambulatory Visit: Payer: Medicare Other | Admitting: Interventional Cardiology

## 2014-07-04 ENCOUNTER — Other Ambulatory Visit (HOSPITAL_BASED_OUTPATIENT_CLINIC_OR_DEPARTMENT_OTHER): Payer: Medicare Other

## 2014-07-04 ENCOUNTER — Other Ambulatory Visit: Payer: Self-pay | Admitting: Interventional Cardiology

## 2014-07-04 DIAGNOSIS — D472 Monoclonal gammopathy: Secondary | ICD-10-CM

## 2014-07-04 LAB — CBC WITH DIFFERENTIAL/PLATELET
BASO%: 3.6 % — AB (ref 0.0–2.0)
Basophils Absolute: 0.2 10*3/uL — ABNORMAL HIGH (ref 0.0–0.1)
EOS ABS: 0.3 10*3/uL (ref 0.0–0.5)
EOS%: 6.8 % (ref 0.0–7.0)
HCT: 46.6 % (ref 34.8–46.6)
HGB: 15 g/dL (ref 11.6–15.9)
LYMPH%: 18.6 % (ref 14.0–49.7)
MCH: 27.5 pg (ref 25.1–34.0)
MCHC: 32.2 g/dL (ref 31.5–36.0)
MCV: 85.6 fL (ref 79.5–101.0)
MONO#: 0.8 10*3/uL (ref 0.1–0.9)
MONO%: 18.8 % — ABNORMAL HIGH (ref 0.0–14.0)
NEUT%: 52.2 % (ref 38.4–76.8)
NEUTROS ABS: 2.2 10*3/uL (ref 1.5–6.5)
Platelets: 139 10*3/uL — ABNORMAL LOW (ref 145–400)
RBC: 5.44 10*6/uL (ref 3.70–5.45)
RDW: 16.5 % — AB (ref 11.2–14.5)
WBC: 4.3 10*3/uL (ref 3.9–10.3)
lymph#: 0.8 10*3/uL — ABNORMAL LOW (ref 0.9–3.3)

## 2014-07-18 ENCOUNTER — Ambulatory Visit (INDEPENDENT_AMBULATORY_CARE_PROVIDER_SITE_OTHER): Payer: Medicare Other

## 2014-07-18 DIAGNOSIS — I639 Cerebral infarction, unspecified: Secondary | ICD-10-CM

## 2014-07-18 DIAGNOSIS — I4891 Unspecified atrial fibrillation: Secondary | ICD-10-CM

## 2014-07-18 DIAGNOSIS — Z7901 Long term (current) use of anticoagulants: Secondary | ICD-10-CM

## 2014-07-18 DIAGNOSIS — Z5181 Encounter for therapeutic drug level monitoring: Secondary | ICD-10-CM

## 2014-07-18 LAB — POCT INR: INR: 3

## 2014-07-26 ENCOUNTER — Other Ambulatory Visit: Payer: Self-pay

## 2014-07-26 DIAGNOSIS — Z1231 Encounter for screening mammogram for malignant neoplasm of breast: Secondary | ICD-10-CM

## 2014-08-07 ENCOUNTER — Ambulatory Visit (INDEPENDENT_AMBULATORY_CARE_PROVIDER_SITE_OTHER): Payer: Medicare Other | Admitting: Interventional Cardiology

## 2014-08-07 ENCOUNTER — Encounter: Payer: Self-pay | Admitting: Interventional Cardiology

## 2014-08-07 VITALS — BP 100/80 | HR 75 | Ht <= 58 in

## 2014-08-07 DIAGNOSIS — I4891 Unspecified atrial fibrillation: Secondary | ICD-10-CM

## 2014-08-07 DIAGNOSIS — I2510 Atherosclerotic heart disease of native coronary artery without angina pectoris: Secondary | ICD-10-CM

## 2014-08-07 DIAGNOSIS — I5022 Chronic systolic (congestive) heart failure: Secondary | ICD-10-CM

## 2014-08-07 DIAGNOSIS — I10 Essential (primary) hypertension: Secondary | ICD-10-CM

## 2014-08-07 DIAGNOSIS — Z89511 Acquired absence of right leg below knee: Secondary | ICD-10-CM

## 2014-08-07 DIAGNOSIS — E1351 Other specified diabetes mellitus with diabetic peripheral angiopathy without gangrene: Secondary | ICD-10-CM

## 2014-08-07 DIAGNOSIS — N186 End stage renal disease: Secondary | ICD-10-CM

## 2014-08-07 DIAGNOSIS — I25810 Atherosclerosis of coronary artery bypass graft(s) without angina pectoris: Secondary | ICD-10-CM

## 2014-08-07 NOTE — Progress Notes (Signed)
Patient ID: [name], female   DOB: Aug 02, 1956, 58 y.o.   MRN: [id number]    [address] [place], [place]  [address] Phone: [phone] Fax:  [phone]  Date:  08/07/2014   ID:  [name], DOB Jan 29, 1956, MRN [id number]  PCP:  Placido Sou, MD   ASSESSMENT:  1. Chronic systolic heart failure, stable with good volume controlled by dialysis 2. Coronary artery disease without angina or other symptoms compatible with ischemia 3. Peripheral vascular disease with bilateral AKA 4. Amiodarone therapy for suppression of atrial fibrillation, stable rhythm 5. Stable sinus rhythm  PLAN:  1. TSH and hepatic panel today and in 6 months on clinical follow-up 2. Clinical follow-up in 6 months 3. Call if any clinical cardiovascular complaints   SUBJECTIVE: [name] is a 58 y.o. female who is doing well. She has not had palpitations or dyspnea. She denies angina. Dialysis is going well. She denies chills and fever.   Wt Readings from Last 3 Encounters:  06/05/14 185 lb 3 oz (84 kg)  03/06/14 182 lb 15.7 oz (83 kg)  01/22/14 184 lb 1.4 oz (83.5 kg)     Past Medical History  Diagnosis Date  . Diabetes mellitus, type 2   . Secondary hyperparathyroidism   . Ischemic cardiomyopathy     a. EF 25-30% 2011;  b.  Echocardiogram (09/01/2013): EF 30-35%, diffuse hypokinesis, MAC, mild MR, mild LAE, mild RVE, severely reduced RVSF, moderate RAE, mildly increased PASP.  [name] Pulmonary hypertension     Est PAsyst 60-Dr Linard Millers, off coumadin, CT neg for PE  . Hyperlipidemia   . Anemia   . Thyroid disease   . Pulmonary edema   . Peripheral vascular disease     a. Prior LE amputations. b. s/p prior LE bypass surgery.  . Sebaceous cyst   . 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 Richardson Dopp  . Shortness of breath     with exertion  . Hemodialysis patient     M,W,F  . TIA (transient ischemic attack) June 2014  . Ulcer of heel due to diabetes     left  .  Kidney disease Dialysis MWF    ESRD secondary to DM, failed transplant 2006. Dialysis  . Atrial fibrillation     a. Dx 08/2013, placed on amiodarone.  . Chronic systolic CHF (congestive heart failure)   . CAD (coronary artery disease)     a. s/p CABG 2011.;  b. Lexiscan Myoview (02/2013): Intermediate risk study; EF 50%, basal inferolateral, basal anterolateral and mid inferolateral moderate ischemia; basal anterior, basal anteroseptal and mid anterior scar;  c. Type 2 NSTEMI during admission for LE revasc surgery 02/2013 in setting of SVT  . Polyneuropathy in diabetes(357.2) 10/31/2013  . Neuropathy   . OSA (obstructive sleep apnea)     uses c-papNPSG 11.23.11-AHI 64.9/hr-central and obstructive apnea    Current Outpatient Prescriptions  Medication Sig Dispense Refill  . acetaminophen (TYLENOL) 325 MG tablet Take 650-975 mg by mouth daily as needed (pain).     [name] amiodarone (PACERONE) 200 MG tablet Take 1 tablet (200 mg total) by mouth daily. 30 tablet 11  . aspirin EC 81 MG tablet Take 81 mg by mouth daily.    [name] atorvastatin (LIPITOR) 10 MG tablet Take 10 mg by mouth daily before breakfast.     . B Complex-C-Folic Acid (DIALYVITE PO) Take 1 tablet by mouth daily.     . cinacalcet (SENSIPAR) 60 MG  tablet Take 60 mg by mouth daily with lunch.     . hydrOXYzine (ATARAX/VISTARIL) 25 MG tablet Take 25 mg by mouth every 6 (six) hours as needed for itching.     . insulin aspart (NOVOLOG FLEXPEN) 100 UNIT/ML FlexPen Inject 3-15 Units into the skin 3 (three) times daily with meals. Per SSI    . megestrol (MEGACE) 40 MG/ML suspension continuous as needed.    . midodrine (PROAMATINE) 10 MG tablet Take 10 mg by mouth daily.     Vladimir Faster Glycol-Propyl Glycol (SYSTANE OP) Apply 1 drop to eye 3 (three) times daily as needed (for dry eyes).    . pregabalin (LYRICA) 25 MG capsule Take 25 mg by mouth 2 (two) times daily.    . sevelamer carbonate (RENVELA) 800 MG tablet Take 500 mg by mouth 2 (two) times  daily. Does not take 3 times a day. Sometimes once or twice    . traMADol (ULTRAM) 50 MG tablet as needed.    . warfarin (COUMADIN) 4 MG tablet Take as directed by coumadin clinic 40 tablet 3   No current facility-administered medications for this visit.    Allergies:    Allergies  Allergen Reactions  . Neurontin [Gabapentin] Anaphylaxis  . Ezetimibe-Simvastatin Other (See Comments)    REACTION: face edema; itching  . Klonopin [Clonazepam] Other (See Comments)    hallucination  . Sertraline Hcl Other (See Comments)    hallucination    Social History:  The patient  reports that she has never smoked. She has never used smokeless tobacco. She reports that she does not drink alcohol or use illicit drugs.   ROS:  Please see the history of present illness.   Appetite is stable. No transient neurological symptoms. Appetite is been stable.   All other systems reviewed and negative.   OBJECTIVE: VS:  BP 100/80 mmHg  Pulse 75  Ht 4\' 8"  (1.422 m)  Wt   SpO2 99% Well nourished, well developed, in no acute distress, bilateral AK amputee HEENT: normal Neck: JVD absent. Carotid bruit absent  Cardiac:  normal S1, S2; RRR; no murmur Lungs:  clear to auscultation bilaterally, no wheezing, rhonchi or rales Abd: soft, nontender, no hepatomegaly Ext: Edema absent. Pulses absent Skin: warm and dry Neuro:  CNs 2-12 intact, no focal abnormalities noted  EKG:  Not performed       Signed, Illene Labrador III, MD 08/07/2014 4:29 PM

## 2014-08-07 NOTE — Patient Instructions (Addendum)
Your physician wants you to follow-up in:  6 months.  You will receive a reminder letter in the mail two months in advance. If you don't receive a letter, please call our office to schedule the follow-up appointment.  Lab work (TSH and Liver profile) to be done at this appt  Your physician recommends that you continue on your current medications as directed. Please refer to the Current Medication list given to you today.

## 2014-08-08 LAB — TSH: TSH: 0.884 u[IU]/mL (ref 0.350–4.500)

## 2014-08-08 LAB — HEPATIC FUNCTION PANEL
ALT: 25 U/L (ref 0–35)
AST: 32 U/L (ref 0–37)
Albumin: 2.7 g/dL — ABNORMAL LOW (ref 3.5–5.2)
Alkaline Phosphatase: 96 U/L (ref 39–117)
BILIRUBIN DIRECT: 0.1 mg/dL (ref 0.0–0.3)
BILIRUBIN INDIRECT: 0.3 mg/dL (ref 0.2–1.2)
TOTAL PROTEIN: 6.1 g/dL (ref 6.0–8.3)
Total Bilirubin: 0.4 mg/dL (ref 0.2–1.2)

## 2014-08-15 ENCOUNTER — Ambulatory Visit (INDEPENDENT_AMBULATORY_CARE_PROVIDER_SITE_OTHER): Payer: Medicare Other | Admitting: *Deleted

## 2014-08-15 ENCOUNTER — Telehealth: Payer: Self-pay

## 2014-08-15 DIAGNOSIS — I4891 Unspecified atrial fibrillation: Secondary | ICD-10-CM

## 2014-08-15 DIAGNOSIS — I639 Cerebral infarction, unspecified: Secondary | ICD-10-CM

## 2014-08-15 DIAGNOSIS — Z7901 Long term (current) use of anticoagulants: Secondary | ICD-10-CM

## 2014-08-15 DIAGNOSIS — Z5181 Encounter for therapeutic drug level monitoring: Secondary | ICD-10-CM

## 2014-08-15 LAB — POCT INR: INR: 3.7

## 2014-08-15 NOTE — Telephone Encounter (Signed)
Pt aware of lab results.  These labs are stable and should be repeated in 6 months.  Pt verbalized understanding.

## 2014-08-15 NOTE — Telephone Encounter (Signed)
-----   Message from [place], MD sent at 08/14/2014  9:04 AM EST ----- These labs are stable and should be repeated in 6 months.

## 2014-08-22 ENCOUNTER — Encounter (HOSPITAL_COMMUNITY): Payer: Self-pay | Admitting: Vascular Surgery

## 2014-08-22 ENCOUNTER — Ambulatory Visit
Admission: RE | Admit: 2014-08-22 | Discharge: 2014-08-22 | Disposition: A | Discharge status: Home / Self Care | Payer: Medicare Other | Source: Ambulatory Visit

## 2014-08-22 ENCOUNTER — Ambulatory Visit (INDEPENDENT_AMBULATORY_CARE_PROVIDER_SITE_OTHER): Payer: Medicare Other | Admitting: Internal Medicine

## 2014-08-22 ENCOUNTER — Ambulatory Visit: Payer: Medicare Other

## 2014-08-22 VITALS — BP 132/68 | HR 76 | Wt 185.2 lb

## 2014-08-22 DIAGNOSIS — I739 Peripheral vascular disease, unspecified: Secondary | ICD-10-CM

## 2014-08-22 DIAGNOSIS — L98499 Non-pressure chronic ulcer of skin of other sites with unspecified severity: Secondary | ICD-10-CM

## 2014-08-22 DIAGNOSIS — I2510 Atherosclerotic heart disease of native coronary artery without angina pectoris: Secondary | ICD-10-CM

## 2014-08-22 DIAGNOSIS — G4733 Obstructive sleep apnea (adult) (pediatric): Secondary | ICD-10-CM

## 2014-08-22 DIAGNOSIS — Z1231 Encounter for screening mammogram for malignant neoplasm of breast: Secondary | ICD-10-CM

## 2014-08-22 DIAGNOSIS — I70209 Unspecified atherosclerosis of native arteries of extremities, unspecified extremity: Secondary | ICD-10-CM

## 2014-08-22 NOTE — Patient Instructions (Signed)
Order- [facility] - patient needs nonfunctioning CPAP repaired or replaced, set on auto 5-20 x 7 days for pressure recommendation. It produces no pressure now. Replace mask of choice, supplies, humidifier as needed. Patient is double amputee- home service visit please.   Dx OSA

## 2014-08-22 NOTE — Progress Notes (Signed)
Subjective:    Patient ID: [name], female    DOB: [birth year], 58 y.o.   MRN: [id number]  HPI 05/27/11- 40 yo F never smoker followed for OSA/ CSA, complicated by ESRD/ dialysis Last here November 12, 2010 YSAY30- feels "used to it". Continues comfortable with it all night every night. Does take it off sometimes. Has too many antibodies for another try at renal transplant.  Has had flu shot. Denies any routine breathing concerns. Has already had flu shot.  05/26/12- 48 yo F never smoker followed for OSA/ CSA, complicated by ESRD/ dialysis, DM, HBP, SLE Wears CPAP 12/[facility] every night for approximately 4-6 hours; pressure doing well; nasal pillows. Got flu vaccine at dialysis.  05/29/13- 32 yo F never smoker followed for OSA/ CSA, complicated by ESRD/ dialysis, DM, HBP, SLE FOLLOWS FOR: not sleeping well; not using CPAP 12/ [facility]  every night-no particular reason She had her other leg amputated in July for  chronic arterial disease. Has not slept well since-difficulty initiating and maintaining sleep. She is not able to recognize symptoms specifically associated with sleep apnea since she stopped CPAP.  08/22/14- 22 yo F never smoker followed for OSA/ CSA, complicated by ESRD/ dialysis, DM, bilat BKA, HBP, SLE FOLLOWS [id number] not coming out of tube x 6 mths.[facility].Not working even after new supplies. Not using CPAP(12/ [facility]) and has not tried to get it fixed. Says she slept better with it. CXR 11/21/13- IMPRESSION Pulmonary vascular congestion without edema. SIGNATURE Electronically Signed  By: Franchot Gallo M.D.  On: 11/21/2013 08:59  Review of Systems-see HPI Constitutional:   No-   weight loss, night sweats, fevers, chills, fatigue, lassitude. HEENT:   No-  headaches, difficulty swallowing, tooth/dental problems, sore throat,       No-  sneezing, itching, ear ache, nasal congestion, post nasal drip,  CV:  No-   chest pain, orthopnea, PND, swelling in lower  extremities, anasarca, dizziness, palpitations Resp: No-   shortness of breath with exertion or at rest.              No-   productive cough,  No non-productive cough,  No-  coughing up of blood.              No-   change in color of mucus.  No- wheezing.   Skin: No-   rash or lesions. GI:  No-   heartburn, indigestion, abdominal pain, nausea, vomiting,  GU:  MS:  No-   joint pain or swelling. +Bilateral LE amputation. . Neuro- nothing unusual  Psych:  No- change in mood or affect. No depression or anxiety.  No memory loss.  Objective:   Physical Exam  General- Alert, Oriented, Affect-appropriate, Distress- none acute, +             wheelchair Skin- rash-none, lesions- none, excoriation- none Lymphadenopathy- none Head- atraumatic            Eyes- Gross vision intact, PERRLA, conjunctivae clear secretions            Ears- Hearing, canals normal            Nose- Clear, no-Septal dev, mucus, polyps, erosion, perforation             Throat- Mallampati II-III , mucosa clear , drainage- none, tonsils- atrophic Neck- flexible , trachea midline, no stridor , thyroid nl, carotid no bruit Chest - symmetrical excursion , unlabored           Heart/CV- RRR , no  murmur , no gallop  , no rub, nl s1 s2. + bruit right upper anterior chest                           - JVD- none , edema- none, stasis changes- none, varices- none           Lung- clear to P&A, wheeze- none, cough- none , dullness-none, rub- none           Chest wall- +sternotomy  scar Abd-  Br/ Gen/ Rectal- Not done, not indicated Extrem- +R BKA/ prosthesis/, +L AKA;  access scars. +Dialysis Access right upper extremity Neuro- grossly intact to observation Assessment & Plan:

## 2014-08-23 ENCOUNTER — Other Ambulatory Visit: Payer: Self-pay | Admitting: Obstetrics and Gynecology

## 2014-08-23 DIAGNOSIS — N63 Unspecified lump in unspecified breast: Secondary | ICD-10-CM

## 2014-08-25 NOTE — Assessment & Plan Note (Signed)
Non-compliant. Realistically after ESRD/ dialysis and bilateral leg amputations, sleep apnea doesn't demand her attention.  Plan- Ask [facility] to make a service visit to her home to assess her machine.

## 2014-08-25 NOTE — Assessment & Plan Note (Signed)
S/p bilateral amputations- wheelchair confined

## 2014-08-29 ENCOUNTER — Ambulatory Visit (INDEPENDENT_AMBULATORY_CARE_PROVIDER_SITE_OTHER): Payer: Medicare Other | Admitting: *Deleted

## 2014-08-29 DIAGNOSIS — Z5181 Encounter for therapeutic drug level monitoring: Secondary | ICD-10-CM

## 2014-08-29 DIAGNOSIS — I4891 Unspecified atrial fibrillation: Secondary | ICD-10-CM

## 2014-08-29 DIAGNOSIS — I639 Cerebral infarction, unspecified: Secondary | ICD-10-CM

## 2014-08-29 DIAGNOSIS — Z7901 Long term (current) use of anticoagulants: Secondary | ICD-10-CM

## 2014-08-29 LAB — POCT INR: INR: 2.6

## 2014-09-04 ENCOUNTER — Telehealth: Payer: Self-pay | Admitting: Internal Medicine

## 2014-09-04 NOTE — Telephone Encounter (Signed)
, °

## 2014-09-10 ENCOUNTER — Ambulatory Visit
Admission: RE | Admit: 2014-09-10 | Discharge: 2014-09-10 | Disposition: A | Discharge status: Home / Self Care | Payer: Medicare Other | Source: Ambulatory Visit | Attending: Obstetrics and Gynecology | Admitting: Obstetrics and Gynecology

## 2014-09-10 DIAGNOSIS — N63 Unspecified lump in unspecified breast: Secondary | ICD-10-CM

## 2014-09-19 ENCOUNTER — Ambulatory Visit (INDEPENDENT_AMBULATORY_CARE_PROVIDER_SITE_OTHER): Payer: Medicare Other | Admitting: Pharmacist

## 2014-09-19 DIAGNOSIS — I639 Cerebral infarction, unspecified: Secondary | ICD-10-CM

## 2014-09-19 DIAGNOSIS — Z5181 Encounter for therapeutic drug level monitoring: Secondary | ICD-10-CM

## 2014-09-19 DIAGNOSIS — I4891 Unspecified atrial fibrillation: Secondary | ICD-10-CM

## 2014-09-19 DIAGNOSIS — Z7901 Long term (current) use of anticoagulants: Secondary | ICD-10-CM

## 2014-09-19 LAB — POCT INR: INR: 2.5

## 2014-09-26 ENCOUNTER — Ambulatory Visit: Payer: Medicare Other

## 2014-09-26 ENCOUNTER — Other Ambulatory Visit: Payer: Medicare Other

## 2014-10-02 ENCOUNTER — Ambulatory Visit: Payer: Medicare Other | Admitting: Internal Medicine

## 2014-10-02 ENCOUNTER — Other Ambulatory Visit: Payer: Self-pay | Admitting: *Deleted

## 2014-10-02 ENCOUNTER — Encounter: Payer: Self-pay | Admitting: Internal Medicine

## 2014-10-02 ENCOUNTER — Telehealth: Payer: Self-pay | Admitting: Internal Medicine

## 2014-10-02 ENCOUNTER — Ambulatory Visit (HOSPITAL_BASED_OUTPATIENT_CLINIC_OR_DEPARTMENT_OTHER): Payer: Medicare Other | Admitting: Internal Medicine

## 2014-10-02 ENCOUNTER — Other Ambulatory Visit: Payer: Medicare Other

## 2014-10-02 ENCOUNTER — Other Ambulatory Visit (HOSPITAL_BASED_OUTPATIENT_CLINIC_OR_DEPARTMENT_OTHER): Payer: Medicare Other

## 2014-10-02 VITALS — BP 116/61 | HR 78 | Temp 98.7°F | Resp 18 | Ht <= 58 in

## 2014-10-02 DIAGNOSIS — D472 Monoclonal gammopathy: Secondary | ICD-10-CM

## 2014-10-02 DIAGNOSIS — Z992 Dependence on renal dialysis: Secondary | ICD-10-CM

## 2014-10-02 DIAGNOSIS — N186 End stage renal disease: Secondary | ICD-10-CM

## 2014-10-02 DIAGNOSIS — I639 Cerebral infarction, unspecified: Secondary | ICD-10-CM

## 2014-10-02 LAB — COMPREHENSIVE METABOLIC PANEL (CC13)
ALBUMIN: 2.3 g/dL — AB (ref 3.5–5.0)
ALT: 29 U/L (ref 0–55)
ANION GAP: 8 meq/L (ref 3–11)
AST: 28 U/L (ref 5–34)
Alkaline Phosphatase: 94 U/L (ref 40–150)
BILIRUBIN TOTAL: 0.57 mg/dL (ref 0.20–1.20)
BUN: 14 mg/dL (ref 7.0–26.0)
CHLORIDE: 99 meq/L (ref 98–109)
CO2: 30 mEq/L — ABNORMAL HIGH (ref 22–29)
Calcium: 8.4 mg/dL (ref 8.4–10.4)
Creatinine: 2.9 mg/dL — ABNORMAL HIGH (ref 0.6–1.1)
EGFR: 20 mL/min/{1.73_m2} — ABNORMAL LOW (ref >90)
Glucose: 106 mg/dl (ref 70–140)
Potassium: 3.4 mEq/L — ABNORMAL LOW (ref 3.5–5.1)
Sodium: 137 mEq/L (ref 136–145)
TOTAL PROTEIN: 6.5 g/dL (ref 6.4–8.3)

## 2014-10-02 LAB — CBC WITH DIFFERENTIAL/PLATELET
BASO%: 1 % (ref 0.0–2.0)
Basophils Absolute: 0.1 10*3/uL (ref 0.0–0.1)
EOS%: 4.1 % (ref 0.0–7.0)
Eosinophils Absolute: 0.2 10*3/uL (ref 0.0–0.5)
HEMATOCRIT: 42.3 % (ref 34.8–46.6)
HGB: 13.9 g/dL (ref 11.6–15.9)
LYMPH#: 0.8 10*3/uL — AB (ref 0.9–3.3)
LYMPH%: 16.3 % (ref 14.0–49.7)
MCH: 28.5 pg (ref 25.1–34.0)
MCHC: 32.9 g/dL (ref 31.5–36.0)
MCV: 86.9 fL (ref 79.5–101.0)
MONO#: 0.9 10*3/uL (ref 0.1–0.9)
MONO%: 18.1 % — ABNORMAL HIGH (ref 0.0–14.0)
NEUT#: 2.9 10*3/uL (ref 1.5–6.5)
NEUT%: 60.5 % (ref 38.4–76.8)
Platelets: 105 10*3/uL — ABNORMAL LOW (ref 145–400)
RBC: 4.87 10*6/uL (ref 3.70–5.45)
RDW: 17.2 % — ABNORMAL HIGH (ref 11.2–14.5)
WBC: 4.9 10*3/uL (ref 3.9–10.3)

## 2014-10-02 LAB — LACTATE DEHYDROGENASE (CC13): LDH: 180 U/L (ref 125–245)

## 2014-10-02 NOTE — Telephone Encounter (Signed)
Gave avs & calendar for July °

## 2014-10-02 NOTE — Progress Notes (Signed)
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 2) end-stage renal disease currently on hemodialysis on Monday, Wednesday and Friday under the care of Dr. Jimmy Footman.  PRIOR THERAPY: None  CURRENT THERAPY: Observation.  INTERVAL HISTORY: [name] 59 y.o. female returns to the clinic today for six-month follow-up visit. She is a former patient of Dr. Jamse Arn then Dr. Juliann Mule. She is here today to establish care with me after these physicians left the practice. She was diagnosed with monoclonal gammopathy of undetermined significance in November 2013 and has been observation since that time. She was last seen by Dr. Juliann Mule in July 2015. The patient is feeling fine today was no specific complaints. She is tolerating her hemodialysis fairly well. She denied having any significant weight loss or night sweats. She denied having any chest pain, shortness of breath, cough or hemoptysis. She has no significant nausea or vomiting. She had repeat myeloma panel performed earlier today and she is here for evaluation and discussion of her lab results and recommendation regarding her condition.  MEDICAL HISTORY: Past Medical History  Diagnosis Date  . Diabetes mellitus, type 2   . Secondary hyperparathyroidism   . Ischemic cardiomyopathy     a. EF 25-30% 2011;  b.  Echocardiogram (09/01/2013): EF 30-35%, diffuse hypokinesis, MAC, mild MR, mild LAE, mild RVE, severely reduced RVSF, moderate RAE, mildly increased PASP.  [name] Pulmonary hypertension     Est PAsyst 60-Dr Linard Millers, off coumadin, CT neg for PE  . Hyperlipidemia   . Anemia   . Thyroid disease   . Pulmonary edema   . Peripheral vascular disease     a. Prior LE amputations. b. s/p prior LE bypass surgery.  . Sebaceous cyst   . Systemic  lupus 08/08/07    Dr. Richardson Dopp  . Shortness of breath     with exertion  . Hemodialysis patient     M,W,F  . TIA (transient ischemic attack) June 2014  . Ulcer of heel due to diabetes     left  . Kidney disease Dialysis MWF    ESRD secondary to DM, failed transplant 2006. Dialysis  . Atrial fibrillation     a. Dx 08/2013, placed on amiodarone.  . Chronic systolic CHF (congestive heart failure)   . CAD (coronary artery disease)     a. s/p CABG 2011.;  b. Lexiscan Myoview (02/2013): Intermediate risk study; EF 50%, basal inferolateral, basal anterolateral and mid inferolateral moderate ischemia; basal anterior, basal anteroseptal and mid anterior scar;  c. Type 2 NSTEMI during admission for LE revasc surgery 02/2013 in setting of SVT  . Polyneuropathy in diabetes(357.2) 10/31/2013  . Neuropathy   . OSA (obstructive sleep apnea)     uses c-papNPSG 11.23.11-AHI 64.9/hr-central and obstructive apnea    ALLERGIES:  is allergic to neurontin; ezetimibe-simvastatin; klonopin; and sertraline hcl.  MEDICATIONS:  Current Outpatient Prescriptions  Medication Sig Dispense Refill  . acetaminophen (TYLENOL) 325 MG tablet Take 650-975 mg by mouth daily as needed (pain).     [name] amiodarone (PACERONE) 200 MG tablet Take 1 tablet (200 mg total) by mouth daily. 30 tablet 11  . aspirin EC 81 MG tablet Take 81 mg by mouth daily.    [name] atorvastatin (LIPITOR) 10 MG tablet Take 10 mg by mouth daily before breakfast.     .  B Complex-C-Folic Acid (DIALYVITE PO) Take 1 tablet by mouth daily.     . cinacalcet (SENSIPAR) 60 MG tablet Take 60 mg by mouth daily with lunch.     . hydrOXYzine (ATARAX/VISTARIL) 25 MG tablet Take 25 mg by mouth every 6 (six) hours as needed for itching.     . insulin aspart (NOVOLOG FLEXPEN) 100 UNIT/ML FlexPen Inject 3-15 Units into the skin 3 (three) times daily with meals. Per SSI    . megestrol (MEGACE) 40 MG/ML suspension continuous as needed.    . midodrine (PROAMATINE) 10  MG tablet Take 10 mg by mouth daily.     Vladimir Faster Glycol-Propyl Glycol (SYSTANE OP) Apply 1 drop to eye 3 (three) times daily as needed (for dry eyes).    . pregabalin (LYRICA) 25 MG capsule Take 25 mg by mouth 2 (two) times daily.    . sevelamer carbonate (RENVELA) 800 MG tablet Take 500 mg by mouth 2 (two) times daily. Does not take 3 times a day. Sometimes once or twice    . traMADol (ULTRAM) 50 MG tablet as needed.    . warfarin (COUMADIN) 4 MG tablet Take as directed by coumadin clinic 40 tablet 3   No current facility-administered medications for this visit.    SURGICAL HISTORY:  Past Surgical History  Procedure Laterality Date  . Coronary artery bypass graft  2011    3V  . Insertion of dialysis catheter    . Above knee leg amputation Left     infection/gangrene 10/11/07  . Nasal mucosal biopsy    . Ovarian cyst surgery    . 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 Laser retinal surgery  2011  . Kidney transplant      rejection  . Abdominal angiogram Left 01/25/13  . Colonoscopy    . Cardiac catheterization    . Eye surgery    . Femoral-popliteal bypass graft Left 02/20/2013    Procedure:  FEMORAL-to below knee POPLITEAL ARTERY bypasswith saphenous vein with intraoperative angiogram ;  Surgeon: Angelia Mould, MD;  Location: [facility];  Service: Vascular;  Laterality: Left;  fem-below knee pop  . Amputation Left 03/27/2013    Procedure: AMPUTATION ABOVE KNEE;  Surgeon: Angelia Mould, MD;  Location: [facility];  Service: Vascular;  Laterality: Left;  . Tee without cardioversion N/A 09/11/2013    Procedure: TRANSESOPHAGEAL ECHOCARDIOGRAM (TEE);  Surgeon: Casandra Doffing, MD;  Location: [facility] ENDOSCOPY;  Service: Cardiovascular;  Laterality: N/A;  . Cardioversion N/A 09/11/2013    Procedure: CARDIOVERSION;  Surgeon: Casandra Doffing, MD;  Location: [facility] ENDOSCOPY;  Service: Cardiovascular;  Laterality: N/A;  . Below knee leg amputation Right   . Revison  of arteriovenous fistula Right 11/21/2013    Procedure: REVISON OF ARTERIOVENOUS FISTULA;  Surgeon: Rosetta Posner, MD;  Location: [facility];  Service: Vascular;  Laterality: Right;  . Cataract surgery Right 01-07-2014  . Lower extremity angiogram Left 01/25/2013    Procedure: LOWER EXTREMITY ANGIOGRAM;  Surgeon: Conrad Walsh, MD;  Location: [facility] CATH LAB;  Service: Cardiovascular;  Laterality: Left;    REVIEW OF SYSTEMS:  Constitutional: negative Eyes: negative Ears, nose, mouth, throat, and face: negative Respiratory: negative Cardiovascular: negative Gastrointestinal: negative Genitourinary:negative Integument/breast: negative Hematologic/lymphatic: negative Musculoskeletal:negative Neurological: negative Behavioral/Psych: negative Endocrine: negative Allergic/Immunologic: negative   PHYSICAL EXAMINATION: General appearance: alert, cooperative and no distress Head: Normocephalic, without obvious abnormality, atraumatic Neck: no adenopathy, no JVD, supple, symmetrical, trachea midline and thyroid not enlarged,  symmetric, no tenderness/mass/nodules Lymph nodes: Cervical, supraclavicular, and axillary nodes normal. Resp: clear to auscultation bilaterally Back: symmetric, no curvature. ROM normal. No CVA tenderness. Cardio: regular rate and rhythm, S1, S2 normal, no murmur, click, rub or gallop GI: soft, non-tender; bowel sounds normal; no masses,  no organomegaly Extremities: Above-knee amputation Neurologic: Alert and oriented X 3, normal strength and tone. Normal symmetric reflexes. Normal coordination and gait  ECOG PERFORMANCE STATUS: 1 - Symptomatic but completely ambulatory  Blood pressure 116/61, pulse 78, temperature 98.7 F (37.1 C), temperature source Oral, resp. rate 18, height 4\' 8"  (1.422 m), weight 0 lb (0 kg), SpO2 98 %.  LABORATORY DATA: Lab Results  Component Value Date   WBC 4.9 10/02/2014   HGB 13.9 10/02/2014   HCT 42.3 10/02/2014   MCV 86.9 10/02/2014   PLT  105* 10/02/2014      Chemistry      Component Value Date/Time   NA 140 03/26/2014 0907   NA 140 11/21/2013 0902   K 3.2* 03/26/2014 0907   K 3.1* 11/21/2013 0902   CL 95* 09/17/2013 2152   CL 98 07/11/2012 1427   CO2 29 03/26/2014 0907   CO2 23 09/17/2013 2152   BUN 39.1* 03/26/2014 0907   BUN 27* 09/17/2013 2152   CREATININE 5.4* 03/26/2014 0907   CREATININE 4.99* 09/17/2013 2152      Component Value Date/Time   CALCIUM 7.4* 03/26/2014 0907   CALCIUM 7.4* 09/17/2013 2152   ALKPHOS 96 08/07/2014 1538   ALKPHOS 164* 03/26/2014 0907   AST 32 08/07/2014 1538   AST 27 03/26/2014 0907   ALT 25 08/07/2014 1538   ALT 25 03/26/2014 0907   BILITOT 0.4 08/07/2014 1538   BILITOT 0.51 03/26/2014 0907       RADIOGRAPHIC STUDIES: Mm Digital Diagnostic Bilat  09/10/2014   CLINICAL DATA:  Right breast masses. Patient is a diabetic. Patient's history of bilateral lower extremity amputations. Patient has had right breast edema in the past. Her dialysis fistula is in the right arm. She has had a previous left arm dialysis fistula. Patient has been on dialysis for 15 years  EXAM: DIGITAL DIAGNOSTIC  BILATERAL MAMMOGRAM WITH CAD  ULTRASOUND RIGHT BREAST  COMPARISON:  Prior exams  ACR Breast Density Category b: There are scattered areas of fibroglandular density.  FINDINGS: There are no discrete masses or areas of architectural distortion. There are no suspicious calcifications.  Skin thickening and diffuse thickening of the suspensory ligaments, findings consistent with diffuse breast edema, has increased since the prior study. There is milder left breast edema that is similar to the most recent prior exam.  Mammographic images were processed with CAD.  On physical exam, 2 areas of relative firmness are noted superficially in the right breast, 1 near 12 o'clock in the other between 2 and 3 o'clock.  Ultrasound is performed, showing diffuse edema and prominent vessels, but no mass or cyst. Imaging was  targeted to the palpable abnormalities, 1 at 12 o'clock, 12 cm the nipple in the other at 3 o'clock, 12 cm the nipple.  IMPRESSION: No evidence of malignancy. Benign right breast edema. Breast edema is most likely from the patient's right-sided dialysis fistula.  RECOMMENDATION: Screening mammogram in one year.(Code:SM-B-01Y)  I have discussed the findings and recommendations with the patient. Results were also provided in writing at the conclusion of the visit. If applicable, a reminder letter will be sent to the patient regarding the next appointment.  BI-RADS CATEGORY  2: Benign.  Electronically Signed   By: Lajean Manes M.D.   On: 09/10/2014 15:47   US Breast Ltd Uni Right Inc Axilla  09/10/2014   CLINICAL DATA:  Right breast masses. Patient is a diabetic. Patient's history of bilateral lower extremity amputations. Patient has had right breast edema in the past. Her dialysis fistula is in the right arm. She has had a previous left arm dialysis fistula. Patient has been on dialysis for 15 years  EXAM: DIGITAL DIAGNOSTIC  BILATERAL MAMMOGRAM WITH CAD  ULTRASOUND RIGHT BREAST  COMPARISON:  Prior exams  ACR Breast Density Category b: There are scattered areas of fibroglandular density.  FINDINGS: There are no discrete masses or areas of architectural distortion. There are no suspicious calcifications.  Skin thickening and diffuse thickening of the suspensory ligaments, findings consistent with diffuse breast edema, has increased since the prior study. There is milder left breast edema that is similar to the most recent prior exam.  Mammographic images were processed with CAD.  On physical exam, 2 areas of relative firmness are noted superficially in the right breast, 1 near 12 o'clock in the other between 2 and 3 o'clock.  Ultrasound is performed, showing diffuse edema and prominent vessels, but no mass or cyst. Imaging was targeted to the palpable abnormalities, 1 at 12 o'clock, 12 cm the nipple in the other  at 3 o'clock, 12 cm the nipple.  IMPRESSION: No evidence of malignancy. Benign right breast edema. Breast edema is most likely from the patient's right-sided dialysis fistula.  RECOMMENDATION: Screening mammogram in one year.(Code:SM-[id number])  I have discussed the findings and recommendations with the patient. Results were also provided in writing at the conclusion of the visit. If applicable, a reminder letter will be sent to the patient regarding the next appointment.  BI-RADS CATEGORY  2: Benign.   Electronically Signed   By: Lajean Manes M.D.   On: 09/10/2014 15:47    ASSESSMENT AND PLAN: This is a very pleasant 59 years old African-American female with multiple medical problems and was seen in the clinic by the 2 previous oncologist for monoclonal gammopathy of undetermined significance. The patient had significantly elevated free Kappa light chain as well as free lambda light chain on the previous bloodwork. The myeloma panel performed earlier today is still pending. I recommended for the patient to continue on observation for now with repeat myeloma panel in 6 months as there is no evidence for disease progression on the bloodwork performed earlier today. For the end-stage renal disease, she will continue on hemodialysis as a scheduled. I recommended for the patient to call immediately if she has any concerning symptoms in the interval. The patient voices understanding of current disease status and treatment options and is in agreement with the current care plan.  All questions were answered. The patient knows to call the clinic with any problems, questions or concerns. We can certainly see the patient much sooner if necessary.  I spent 15 minutes counseling the patient face to face. The total time spent in the appointment was 25 minutes.  Disclaimer: This note was dictated with voice recognition software. Similar sounding words can inadvertently be transcribed and may not be corrected upon  review.

## 2014-10-03 LAB — KAPPA/LAMBDA LIGHT CHAINS
Kappa free light chain: 79.9 mg/dL — ABNORMAL HIGH (ref 0.33–1.94)
Kappa:Lambda Ratio: 4.49 — ABNORMAL HIGH (ref 0.26–1.65)
LAMBDA FREE LGHT CHN: 17.8 mg/dL — AB (ref 0.57–2.63)

## 2014-10-03 LAB — IGG, IGA, IGM
IgA: 127 mg/dL (ref 69–380)
IgG (Immunoglobin G), Serum: 2020 mg/dL — ABNORMAL HIGH (ref 690–1700)
IgM, Serum: 16 mg/dL — ABNORMAL LOW (ref 52–322)

## 2014-10-17 ENCOUNTER — Ambulatory Visit (INDEPENDENT_AMBULATORY_CARE_PROVIDER_SITE_OTHER): Payer: Medicare Other | Admitting: *Deleted

## 2014-10-17 DIAGNOSIS — Z5181 Encounter for therapeutic drug level monitoring: Secondary | ICD-10-CM

## 2014-10-17 DIAGNOSIS — I4891 Unspecified atrial fibrillation: Secondary | ICD-10-CM

## 2014-10-17 DIAGNOSIS — I639 Cerebral infarction, unspecified: Secondary | ICD-10-CM

## 2014-10-17 DIAGNOSIS — Z7901 Long term (current) use of anticoagulants: Secondary | ICD-10-CM

## 2014-10-17 LAB — POCT INR: INR: 2.6

## 2014-10-22 NOTE — Progress Notes (Signed)
Consulted with Dr. Marcell Barlow about patient's medical history and per Dr. Marcell Barlow, Patient needs to have procedure done at [facility] because of medical history. Left message for Dr. Johnnette Gourd assistant to call nurse .

## 2014-10-23 NOTE — Progress Notes (Signed)
10-14-14 1020 Dr. Oletta Lamas office "Estill Bamberg" -made aware of Dr. Marcell Barlow requests  to have pt procedure done at [facility] due to multiple medical issues and concerns for best management.

## 2014-11-01 ENCOUNTER — Encounter (HOSPITAL_COMMUNITY): Admission: RE | Payer: Self-pay | Source: Ambulatory Visit

## 2014-11-01 SURGERY — COLONOSCOPY WITH PROPOFOL
Anesthesia: Monitor Anesthesia Care

## 2014-11-04 ENCOUNTER — Other Ambulatory Visit: Payer: Self-pay | Admitting: Interventional Cardiology

## 2014-11-05 ENCOUNTER — Ambulatory Visit (HOSPITAL_COMMUNITY): Admission: RE | Admit: 2014-11-05 | Payer: Medicare Other | Source: Ambulatory Visit | Admitting: Gastroenterology

## 2014-11-07 ENCOUNTER — Ambulatory Visit (INDEPENDENT_AMBULATORY_CARE_PROVIDER_SITE_OTHER): Payer: Medicare Other | Admitting: *Deleted

## 2014-11-07 DIAGNOSIS — I639 Cerebral infarction, unspecified: Secondary | ICD-10-CM

## 2014-11-07 DIAGNOSIS — Z7901 Long term (current) use of anticoagulants: Secondary | ICD-10-CM

## 2014-11-07 DIAGNOSIS — Z5181 Encounter for therapeutic drug level monitoring: Secondary | ICD-10-CM

## 2014-11-07 DIAGNOSIS — I4891 Unspecified atrial fibrillation: Secondary | ICD-10-CM

## 2014-11-07 LAB — POCT INR: INR: 2.9

## 2014-11-25 ENCOUNTER — Ambulatory Visit
Admission: RE | Admit: 2014-11-25 | Discharge: 2014-11-25 | Disposition: A | Discharge status: Home / Self Care | Payer: Medicare Other | Source: Ambulatory Visit | Attending: Nephrology | Admitting: Nephrology

## 2014-11-25 ENCOUNTER — Other Ambulatory Visit: Payer: Self-pay | Admitting: Nephrology

## 2014-11-25 DIAGNOSIS — R059 Cough, unspecified: Secondary | ICD-10-CM

## 2014-11-25 DIAGNOSIS — R05 Cough: Secondary | ICD-10-CM

## 2014-11-25 DIAGNOSIS — R0602 Shortness of breath: Secondary | ICD-10-CM

## 2014-11-26 ENCOUNTER — Telehealth: Payer: Self-pay | Admitting: Interventional Cardiology

## 2014-11-26 NOTE — Telephone Encounter (Signed)
New message   Request for surgical clearance:  1. What type of surgery is being performed? Eye surgery   2. When is this surgery scheduled? 3.17.2016   3. Are there any medications that need to be held prior to surgery and how long? Discuss coumadin   4. Name of physician performing surgery? kelly calling from dr. Corene Cornea sanders  5. What is your office phone and fax number? [phone] /  fax [phone]

## 2014-11-26 NOTE — Telephone Encounter (Signed)
Spoke with Lattie Haw Dr Marlou Porch CMA and also spoke with Claiborne Billings at Dr Baird [facility] office. Pt is to hold coumadin  March 15th,.16th,.17th,.18th,and 19th and to restart coumadin on March 20th at same dose coumadin 4mg  daily except 6mg  on MWF. Pt is scheduled for Retina Repair on March 17th. This nurse called and spoke with pt and gave her above instructions and she repeated them back to me with understanding. Instructed to keep her already scheduled appt in the coumadin clinic on March 24th and she again stated understanding.

## 2014-11-26 NOTE — Telephone Encounter (Signed)
Claiborne Billings @ Dr.Sanders aware. Per Dr.Skains (DOD) ok for pt to hold coumadin 5 day for Retina repair. FYI fwd to the coumadin clinic.

## 2014-11-28 HISTORY — PX: EYE SURGERY: SHX253

## 2014-12-05 ENCOUNTER — Ambulatory Visit (INDEPENDENT_AMBULATORY_CARE_PROVIDER_SITE_OTHER): Payer: Medicare Other | Admitting: Pharmacist Clinician (PhC)/ Clinical Pharmacy Specialist

## 2014-12-05 DIAGNOSIS — Z5181 Encounter for therapeutic drug level monitoring: Secondary | ICD-10-CM

## 2014-12-05 DIAGNOSIS — I4891 Unspecified atrial fibrillation: Secondary | ICD-10-CM

## 2014-12-05 DIAGNOSIS — I639 Cerebral infarction, unspecified: Secondary | ICD-10-CM | POA: Diagnosis not present

## 2014-12-05 DIAGNOSIS — Z7901 Long term (current) use of anticoagulants: Secondary | ICD-10-CM | POA: Diagnosis not present

## 2014-12-05 LAB — POCT INR: INR: 2.5

## 2014-12-09 ENCOUNTER — Encounter (HOSPITAL_COMMUNITY): Payer: Self-pay | Admitting: *Deleted

## 2014-12-09 NOTE — Progress Notes (Signed)
Pt denies SOB and chest pain. Pt stated that she is under the care of Dr. Daneen Schick, cardiology and he is aware that she is having the procedure. Pt stated that she stopped her Coumadin according to MD instructions, last dose was Sunday, December 08, 2014. Pt stated that she was not told to stop taking Aspirin. Pt told to stop taking otc vitamins, herbal medications and NSAID's ( per anesthesia protocol). Pt made aware not to take any diabetic medications the morning of procedure. Pt verbalized understanding of all instructions.

## 2014-12-09 NOTE — Progress Notes (Signed)
Anesthesia Chart Review:Patient is a 59 year old female scheduled for colonoscopy with Dr. Oletta Lamas on 12/12/14.  Posted for MAC anesthesia.   Patient is a 59 year old female scheduled for left FPBG on 02/1013 by Dr. Scot Dock. 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 Nephrologist is Dr. Jimmy Footman. Pulmonologist is Dr. Baird Lyons. Cardiologist is Dr. Daneen Schick, last visit 08/07/14. By notes, CAD and CHF were felt stable at that time.  She was maintaining SR.   Meds include amiodarone, Lipitor, ASA, Sensipar, Novolog, Megace, midodrine, Lyrica, Renvela, warfarin (on hold for procedure).  Notes indicate that she is no on a b-blocker, ACE inhibitor or ARB due to issues with hypotension.  TEE 09/11/13:  - Left ventricle: Systolic function was moderately reduced. The estimated ejection fraction was in the range of 35% to 40%. - Aortic valve: Trivial regurgitation. - Mitral valve: Mild regurgitation. - Left atrium: No evidence of thrombus in the atrial cavity or appendage. There was spontaneous echo contrast ("smoke"). - Right ventricle: Systolic function was moderately reduced. - Right atrium: The atrium was dilated. - Atrial septum: The septum bowed from right to left, consistent with increased right atrial pressure. - Tricuspid valve: Moderate regurgitation. (Prior by echo was EF 30-35% on 09/09/13 and 45-50% on  03/24/10.)  Nuclear stress test on 02/15/13 showed evidence of moderate ischemia in the basal inferolateral, basal anterolateral and mid inferolateral regions. Moderate perfusion defect in the basal anterior, basal anteroseptal and  mid anterior regions. This is consistent with infarct/scar. EF 50%. Hypo to akinesis consistent with the above noted pattern of infarction. (This was done in 2014 as part of a pre-operative evaluation prior to a left FPBG. Results were reviewed by Dr. Daneen Schick who felt that in the absence of ischemic symptoms, he would not recommended further testing.)    Her last EKG is > one year ago.   11/25/14 CXR: Cardiomegaly without acute cardiopulmonary disease. Specifically, no evidence of edema.  Additional pre-procedure testing to be done on arrival.  She denied any acute CV/CHF symptoms during her RN phone interview.  Patient to be further evaluated by her assigned anesthesiologist on the day of her procedure.  Updated anesthesiologist Dr. Lissa Hoard regarding her history and that she appeared stable at her cardiology visit ~ 4 months ago.  [facility] Short Stay Center/Anesthesiology Phone [phone] 12/09/2014 3:53 PM

## 2014-12-12 ENCOUNTER — Ambulatory Visit (HOSPITAL_COMMUNITY): Payer: Medicare Other | Admitting: Vascular Surgery

## 2014-12-12 ENCOUNTER — Encounter (HOSPITAL_COMMUNITY)
Admission: RE | Disposition: A | Discharge status: Home / Self Care | Payer: Self-pay | Source: Ambulatory Visit | Attending: Gastroenterology

## 2014-12-12 ENCOUNTER — Other Ambulatory Visit: Payer: Self-pay | Admitting: Gastroenterology

## 2014-12-12 ENCOUNTER — Ambulatory Visit (HOSPITAL_COMMUNITY)
Admission: RE | Admit: 2014-12-12 | Discharge: 2014-12-12 | Disposition: A | Discharge status: Home / Self Care | Payer: Medicare Other | Source: Ambulatory Visit | Attending: Gastroenterology | Admitting: Gastroenterology

## 2014-12-12 ENCOUNTER — Encounter (HOSPITAL_COMMUNITY): Payer: Self-pay | Admitting: *Deleted

## 2014-12-12 DIAGNOSIS — I120 Hypertensive chronic kidney disease with stage 5 chronic kidney disease or end stage renal disease: Secondary | ICD-10-CM | POA: Diagnosis not present

## 2014-12-12 DIAGNOSIS — G4733 Obstructive sleep apnea (adult) (pediatric): Secondary | ICD-10-CM | POA: Insufficient documentation

## 2014-12-12 DIAGNOSIS — Z79899 Other long term (current) drug therapy: Secondary | ICD-10-CM | POA: Insufficient documentation

## 2014-12-12 DIAGNOSIS — Z792 Long term (current) use of antibiotics: Secondary | ICD-10-CM | POA: Diagnosis not present

## 2014-12-12 DIAGNOSIS — G4700 Insomnia, unspecified: Secondary | ICD-10-CM | POA: Diagnosis not present

## 2014-12-12 DIAGNOSIS — E669 Obesity, unspecified: Secondary | ICD-10-CM | POA: Diagnosis not present

## 2014-12-12 DIAGNOSIS — E1151 Type 2 diabetes mellitus with diabetic peripheral angiopathy without gangrene: Secondary | ICD-10-CM | POA: Insufficient documentation

## 2014-12-12 DIAGNOSIS — Z89511 Acquired absence of right leg below knee: Secondary | ICD-10-CM | POA: Diagnosis not present

## 2014-12-12 DIAGNOSIS — Z89612 Acquired absence of left leg above knee: Secondary | ICD-10-CM | POA: Diagnosis not present

## 2014-12-12 DIAGNOSIS — E785 Hyperlipidemia, unspecified: Secondary | ICD-10-CM | POA: Insufficient documentation

## 2014-12-12 DIAGNOSIS — M329 Systemic lupus erythematosus, unspecified: Secondary | ICD-10-CM | POA: Diagnosis not present

## 2014-12-12 DIAGNOSIS — Z940 Kidney transplant status: Secondary | ICD-10-CM | POA: Insufficient documentation

## 2014-12-12 DIAGNOSIS — Z794 Long term (current) use of insulin: Secondary | ICD-10-CM | POA: Insufficient documentation

## 2014-12-12 DIAGNOSIS — D123 Benign neoplasm of transverse colon: Secondary | ICD-10-CM | POA: Insufficient documentation

## 2014-12-12 DIAGNOSIS — Z7901 Long term (current) use of anticoagulants: Secondary | ICD-10-CM | POA: Insufficient documentation

## 2014-12-12 DIAGNOSIS — Z791 Long term (current) use of non-steroidal anti-inflammatories (NSAID): Secondary | ICD-10-CM | POA: Insufficient documentation

## 2014-12-12 DIAGNOSIS — I5022 Chronic systolic (congestive) heart failure: Secondary | ICD-10-CM | POA: Insufficient documentation

## 2014-12-12 DIAGNOSIS — Z951 Presence of aortocoronary bypass graft: Secondary | ICD-10-CM | POA: Diagnosis not present

## 2014-12-12 DIAGNOSIS — I272 Other secondary pulmonary hypertension: Secondary | ICD-10-CM | POA: Insufficient documentation

## 2014-12-12 DIAGNOSIS — N186 End stage renal disease: Secondary | ICD-10-CM | POA: Insufficient documentation

## 2014-12-12 DIAGNOSIS — D631 Anemia in chronic kidney disease: Secondary | ICD-10-CM | POA: Insufficient documentation

## 2014-12-12 DIAGNOSIS — E1122 Type 2 diabetes mellitus with diabetic chronic kidney disease: Secondary | ICD-10-CM | POA: Insufficient documentation

## 2014-12-12 DIAGNOSIS — Z86010 Personal history of colonic polyps: Secondary | ICD-10-CM | POA: Diagnosis not present

## 2014-12-12 DIAGNOSIS — K5730 Diverticulosis of large intestine without perforation or abscess without bleeding: Secondary | ICD-10-CM | POA: Insufficient documentation

## 2014-12-12 DIAGNOSIS — E1142 Type 2 diabetes mellitus with diabetic polyneuropathy: Secondary | ICD-10-CM | POA: Insufficient documentation

## 2014-12-12 DIAGNOSIS — Z87891 Personal history of nicotine dependence: Secondary | ICD-10-CM | POA: Diagnosis not present

## 2014-12-12 DIAGNOSIS — D127 Benign neoplasm of rectosigmoid junction: Secondary | ICD-10-CM | POA: Insufficient documentation

## 2014-12-12 DIAGNOSIS — Z6841 Body Mass Index (BMI) 40.0 and over, adult: Secondary | ICD-10-CM | POA: Diagnosis not present

## 2014-12-12 DIAGNOSIS — Z992 Dependence on renal dialysis: Secondary | ICD-10-CM | POA: Diagnosis not present

## 2014-12-12 DIAGNOSIS — R197 Diarrhea, unspecified: Secondary | ICD-10-CM | POA: Diagnosis present

## 2014-12-12 DIAGNOSIS — I2510 Atherosclerotic heart disease of native coronary artery without angina pectoris: Secondary | ICD-10-CM | POA: Insufficient documentation

## 2014-12-12 DIAGNOSIS — Z8673 Personal history of transient ischemic attack (TIA), and cerebral infarction without residual deficits: Secondary | ICD-10-CM | POA: Insufficient documentation

## 2014-12-12 DIAGNOSIS — I482 Chronic atrial fibrillation: Secondary | ICD-10-CM | POA: Insufficient documentation

## 2014-12-12 HISTORY — DX: Headache, unspecified: R51.9

## 2014-12-12 HISTORY — PX: COLONOSCOPY WITH PROPOFOL: SHX5780

## 2014-12-12 HISTORY — DX: Carpal tunnel syndrome, right upper limb: G56.01

## 2014-12-12 HISTORY — DX: Headache: R51

## 2014-12-12 LAB — GLUCOSE, CAPILLARY: Glucose-Capillary: 100 mg/dL — ABNORMAL HIGH (ref 70–99)

## 2014-12-12 LAB — POCT I-STAT 4, (NA,K, GLUC, HGB,HCT)
Glucose, Bld: 112 mg/dL — ABNORMAL HIGH (ref 70–99)
HCT: 48 % — ABNORMAL HIGH (ref 36.0–46.0)
Hemoglobin: 16.3 g/dL — ABNORMAL HIGH (ref 12.0–15.0)
Potassium: 3.9 mmol/L (ref 3.5–5.1)
Sodium: 135 mmol/L (ref 135–145)

## 2014-12-12 SURGERY — COLONOSCOPY WITH PROPOFOL
Anesthesia: Monitor Anesthesia Care

## 2014-12-12 MED ORDER — SODIUM CHLORIDE 0.9 % IV SOLN
INTRAVENOUS | Status: DC
  Administered 2014-12-12: 1000 mL via INTRAVENOUS

## 2014-12-12 MED ORDER — MIDAZOLAM HCL 5 MG/5ML IJ SOLN
INTRAMUSCULAR | Status: DC | PRN
  Administered 2014-12-12: 2 mg via INTRAVENOUS

## 2014-12-12 MED ORDER — PROPOFOL INFUSION 10 MG/ML OPTIME
INTRAVENOUS | Status: DC | PRN
  Administered 2014-12-12: 50 ug/kg/min via INTRAVENOUS

## 2014-12-12 MED ORDER — LIDOCAINE HCL (CARDIAC) 20 MG/ML IV SOLN
INTRAVENOUS | Status: DC | PRN
  Administered 2014-12-12: 50 mg via INTRAVENOUS

## 2014-12-12 MED ORDER — MIDAZOLAM HCL 5 MG/ML IJ SOLN
INTRAMUSCULAR | Status: AC
  Filled 2014-12-12: qty 1

## 2014-12-12 MED ORDER — SODIUM CHLORIDE 0.9 % IV SOLN
INTRAVENOUS | Status: DC
  Administered 2014-12-12: 12:00:00 via INTRAVENOUS

## 2014-12-12 NOTE — Addendum Note (Signed)
Addended by: Vernie Ammons. on: 12/12/2014 11:38 AM   Modules accepted: Orders

## 2014-12-12 NOTE — Discharge Instructions (Signed)
Resume coumadin on 4/3 at the regular dose

## 2014-12-12 NOTE — Op Note (Signed)
[facility] [place], [address]   COLONOSCOPY PROCEDURE REPORT  PATIENT: [name], [name]  MR#: [id number] BIRTHDATE: 1956-03-05 , 53  yrs. old GENDER: female ENDOSCOPIST: Laurence Spates, MD REFERRED BY:  Mauricia Area, M.D.  Daneen Schick, M.D. PROCEDURE DATE:  12/12/2014 PROCEDURE:   Colonoscopy with snare polypectomy and Colonoscopy with biopsy ASA CLASS:   Class IV INDICATIONS:previous history of colon polyps with chronic diarrhea. Patient has atrial fibrillation multiple vascular problems and has been on anticoagulation which is been on hold for 5 days.[name] MEDICATIONS: Propofol 159 and Versed 2 mg IV  DESCRIPTION OF PROCEDURE:   After the risks and benefits and of the procedure were explained, informed consent was obtained.  revealed no abnormalities of the rectum.    The Pentax Adult Colon [id number] endoscope was introduced through the anus and advanced to the cecum, which was identified by both the appendix and ileocecal valve .  The quality of the prep was good. .  The instrument was then slowly withdrawn as the colon was fully examined. Estimated blood loss is zero unless otherwise noted in this procedure report.     COLON FINDINGS: The mucosa was normal throughout.  Multiple random biopsies were obtained throughout the colon to evaluate for microscopic colitis.   There was moderate diverticulosis noted throughout the entire examined colon.   Polyps were found at the hepatic fiexure one of which was three-quarter centimeter and was removed with the hot snare and recovered.  Another nearby polyp was to millimeters and we could not remove this with the snare and it was cauterized with hot biopsy forceps.  Both of these polyps were placed in jar number 2.   2 polyps were found in the rectosigmoidcolon 20 to 30 cm from the anal verge.  One was approximately 1/2 cm was removed with the hot snare instructed the scope the other polyp was  1 1/2 cm pedunculated, remove with the hot snare and withdraw.  Both of these polyps were placed in jar number 3.     Retroflexed views revealed internal Grade II hemorrhoids.     The scope was then withdrawn from the patient and the procedure completed.  WITHDRAWAL TIME:  COMPLICATIONS: There were no immediate complications. ENDOSCOPIC IMPRESSION: 1.   The mucosa was normal throughout.  Multiple random biopsies were obtained throughout the colon to evaluate for microscopic colitis 2.   There was moderate diverticulosis noted throughout the entire examined colon 3.   Polyps were found at the hepatic fiexure one of which was three-quarter centimeter and was removed with the hot snare and recovered.  Another nearby polyp was to millimeters and we could not remove this with the snare and it was cauterized with hot biopsy forceps.  Both of these polyps were placed in jar number 2 4.   2 polyps were found in the rectosigmoidcolon 20 to 30 cm from the anal verge.  One was approximately 1/2 cm was removed with the hot snare instructed the scope the other polyp was 1 1/2 cm pedunculated, remove with the hot snare and withdraw.  Both of these polyps were placed in jar number 3 RECOMMENDATIONS: 1.  We will repeat colonoscopy based on path results. 2.  We will have the patient resume Coumadin 4/3 at the regular dose 3.  She will come back and see me in the office in approximately one month.  REPEAT EXAM:  WC:BJSEG Deterding, MD  , Dr Pernell Dupre  _______________________________  eSigned:  Laurence Spates, MD 12/12/2014 12:55 PM   CPT CODES: ICD CODES:  The ICD and CPT codes recommended by this software are interpretations from the data that the clinical staff has captured with the software.  The verification of the translation of this report to the ICD and CPT codes and modifiers is the sole responsibility of the health care institution and practicing physician where this report was  generated.  [employer] will not be held responsible for the validity of the ICD and CPT codes included on this report.  AMA assumes no liability for data contained or not contained herein. CPT is a Designer, television/film set of the [employer].   PATIENT NAME:  [name], [name] MR#: [id number]

## 2014-12-12 NOTE — Anesthesia Procedure Notes (Signed)
Procedure Name: MAC Date/Time: 12/12/2014 11:35 AM Performed by: Maeola Harman Pre-anesthesia Checklist: Patient identified, Emergency Drugs available, Suction available, Patient being monitored and Timeout performed Patient Re-evaluated:Patient Re-evaluated prior to inductionOxygen Delivery Method: Nasal cannula Intubation Type: IV induction Placement Confirmation: positive ETCO2 and breath sounds checked- equal and bilateral Dental Injury: Teeth and Oropharynx as per pre-operative assessment

## 2014-12-12 NOTE — Anesthesia Postprocedure Evaluation (Signed)
  Anesthesia Post-op Note  Patient: [name]  Procedure(s) Performed: Procedure(s): COLONOSCOPY WITH PROPOFOL (N/A)  Patient Location: PACU  Anesthesia Type:MAC  Level of Consciousness: awake and alert   Airway and Oxygen Therapy: Patient Spontanous Breathing and Patient connected to nasal cannula oxygen  Post-op Pain: mild  Post-op Assessment: Post-op Vital signs reviewed and Patient's Cardiovascular Status Stable  Post-op Vital Signs: Reviewed and stable  Last Vitals:  Filed Vitals:   12/12/14 1033  BP: 111/82  Pulse: 97  Temp: 36.7 C  Resp: 15    Complications: No apparent anesthesia complications

## 2014-12-12 NOTE — Transfer of Care (Signed)
Immediate Anesthesia Transfer of Care Note  Patient: [name]  Procedure(s) Performed: Procedure(s): COLONOSCOPY WITH PROPOFOL (N/A)  Patient Location: Endoscopy Unit  Anesthesia Type:MAC  Level of Consciousness: awake, alert  and oriented  Airway & Oxygen Therapy: Patient connected to nasal cannula oxygen  Post-op Assessment: Post -op Vital signs reviewed and stable  Post vital signs: stable  Last Vitals:  Filed Vitals:   12/12/14 1033  BP: 111/82  Pulse: 97  Temp: 36.7 C  Resp: 15    Complications: No apparent anesthesia complications

## 2014-12-12 NOTE — H&P (Signed)
Subjective:   Patient is a 59 y.o. female presents with chronic diarrhea and a personal history of colon polyps. Because of this colonoscopies arranged. She has a very complicated history and has ESRD on chronic hemodialysis. She has fairly severe ischemic cardiomyopathy and has undergone prior CABG and is on chronic anticoagulation for this reason and because of peripheral vascular disease with prior left AKA. She has diabetes presumably the cause of many of these problems and has neuropathy. She's had previous CVA, has a monoclonal gammopathy of unclear etiology and chronic atrial fibrillation felt to be the cause for CVA requiring chronic anticoagulation. Patient has had her Coumadin held for 5 days after checking with cardiology and outpatient colonoscopy is to be performed. Procedure including risks and benefits discussed in office.  Patient Active Problem List   Diagnosis Date Noted  . Blister head 06/05/2014  . Wound drainage- Right stump 03/06/2014  . Discomfort-Left stump 03/06/2014  . Scab-Right Thumb 03/06/2014  . Polyneuropathy in diabetes(357.2) 10/31/2013  . Other complications due to renal dialysis device, implant, and graft 10/23/2013  . Encounter for therapeutic drug monitoring 10/18/2013  . Chronic systolic heart failure 62/70/3500  . Long term (current) use of anticoagulants 09/20/2013  . HCAP (healthcare-associated pneumonia) 09/18/2013  . Atrial fibrillation with RVR 09/08/2013  . ESRD (end stage renal disease) on dialysis 09/08/2013  . Delayed surgical wound healing of below-the-knee amputation stump 09/08/2013  . Pain in wound 08/22/2013  . Insomnia 06/07/2013  . Unilateral AKA--left 03/30/2013  . Wound dehiscence, surgical--left groin 03/30/2013  . Leucocytosis 03/30/2013  . Hx of right BKA 03/30/2013  . CAD (coronary artery disease) of artery bypass graft 03/28/2013  . Sepsis 03/28/2013  . Anemia of chronic renal failure 03/28/2013  . Secondary  hyperparathyroidism 03/28/2013  . Acute osteomyelitis of foot 03/25/2013  . Atherosclerotic PVD with ulceration 02/20/2013  . CVA (cerebral infarction) 02/12/2013  . Cardiomyopathy, ischemic 02/03/2013  . DM (diabetes mellitus), secondary, w/peripheral vascular complications 93/81/8299  . Pain in limb 01/31/2013  . PVD (peripheral vascular disease) 10/04/2012  . MGUS (monoclonal gammopathy of unknown significance) 07/11/2012  . Infected sebaceous cyst of Right posterior neck. 09/30/2011  . HYPERLIPIDEMIA 07/28/2010  . Essential hypertension 07/28/2010  . Obstructive sleep apnea 07/27/2010  . ABNORMAL HEART RHYTHMS 07/27/2010  . End stage renal disease 07/27/2010   Past Medical History  Diagnosis Date  . Diabetes mellitus, type 2   . Secondary hyperparathyroidism   . Ischemic cardiomyopathy     a. EF 25-30% 2011;  b.  Echocardiogram (09/01/2013): EF 30-35%, diffuse hypokinesis, MAC, mild MR, mild LAE, mild RVE, severely reduced RVSF, moderate RAE, mildly increased PASP.  [name] Pulmonary hypertension     Est PAsyst 59-Dr Linard Millers, off coumadin, CT neg for PE  . Hyperlipidemia   . Anemia   . Thyroid disease   . Pulmonary edema   . Peripheral vascular disease     a. Prior LE amputations. b. s/p prior LE bypass surgery.  . Sebaceous cyst   . 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 Richardson Dopp  . Shortness of breath     with exertion  . Hemodialysis patient     M,W,F  . TIA (transient ischemic attack) June 2014  . Ulcer of heel due to diabetes     left  . Kidney disease Dialysis MWF    ESRD secondary to DM, failed transplant 2006. Dialysis  . Atrial fibrillation     a. Dx 08/2013, placed on amiodarone.  [name]  Chronic systolic CHF (congestive heart failure)   . CAD (coronary artery disease)     a. s/p CABG 2011.;  b. Lexiscan Myoview (02/2013): Intermediate risk study; EF 50%, basal inferolateral, basal anterolateral and mid inferolateral moderate ischemia; basal anterior, basal  anteroseptal and mid anterior scar;  c. Type 2 NSTEMI during admission for LE revasc surgery 02/2013 in setting of SVT  . Polyneuropathy in diabetes(357.2) 10/31/2013  . Neuropathy   . OSA (obstructive sleep apnea)     uses c-papNPSG 11.23.11-AHI 64.9/hr-central and obstructive apnea  . Headache   . Carpal tunnel syndrome, right     Past Surgical History  Procedure Laterality Date  . Coronary artery bypass graft  2011    3V  . Insertion of dialysis catheter    . Above knee leg amputation Left     infection/gangrene 10/11/07  . Nasal mucosal biopsy    . Ovarian cyst surgery    . 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 Laser retinal surgery  2011  . Kidney transplant      rejection  . Abdominal angiogram Left 01/25/13  . Colonoscopy    . Cardiac catheterization    . Eye surgery    . Femoral-popliteal bypass graft Left 02/20/2013    Procedure:  FEMORAL-to below knee POPLITEAL ARTERY bypasswith saphenous vein with intraoperative angiogram ;  Surgeon: Angelia Mould, MD;  Location: [place];  Service: Vascular;  Laterality: Left;  fem-below knee pop  . Amputation Left 03/27/2013    Procedure: AMPUTATION ABOVE KNEE;  Surgeon: Angelia Mould, MD;  Location: [place];  Service: Vascular;  Laterality: Left;  . Tee without cardioversion N/A 09/11/2013    Procedure: TRANSESOPHAGEAL ECHOCARDIOGRAM (TEE);  Surgeon: Casandra Doffing, MD;  Location: [facility] ENDOSCOPY;  Service: Cardiovascular;  Laterality: N/A;  . Cardioversion N/A 09/11/2013    Procedure: CARDIOVERSION;  Surgeon: Casandra Doffing, MD;  Location: [facility] ENDOSCOPY;  Service: Cardiovascular;  Laterality: N/A;  . Below knee leg amputation Right   . Revison of arteriovenous fistula Right 11/21/2013    Procedure: REVISON OF ARTERIOVENOUS FISTULA;  Surgeon: Rosetta Posner, MD;  Location: [place];  Service: Vascular;  Laterality: Right;  . Cataract surgery Right 01-07-2014  . Lower extremity angiogram Left 01/25/2013     Procedure: LOWER EXTREMITY ANGIOGRAM;  Surgeon: Conrad Hublersburg, MD;  Location: [facility] CATH LAB;  Service: Cardiovascular;  Laterality: Left;    Prescriptions prior to admission  Medication Sig Dispense Refill Last Dose  . acetaminophen (TYLENOL) 325 MG tablet Take 650-975 mg by mouth 3 (three) times daily as needed (pain).    12/11/2014 at 2000  . amiodarone (PACERONE) 200 MG tablet Take 1 tablet (200 mg total) by mouth daily. (Patient taking differently: Take 200 mg by mouth daily at 2 PM daily at 2 PM. ) 30 tablet 11 12/11/2014 at 1200  . aspirin EC 81 MG tablet Take 81 mg by mouth daily.   12/11/2014 at 1200  . atorvastatin (LIPITOR) 10 MG tablet Take 10 mg by mouth daily at 2 PM daily at 2 PM.    12/11/2014 at 1200  . B Complex-C (SUPER B COMPLEX PO) Take 1 tablet by mouth daily at 2 PM daily at 2 PM.   12/11/2014 at 1200  . B Complex-C-Folic Acid (DIALYVITE TABLET) TABS Take 1 tablet by mouth daily at 2 PM daily at 2 PM.   3 12/11/2014 at 1200  . cinacalcet (SENSIPAR) 60 MG tablet Take 60  mg by mouth daily at 2 PM daily at 2 PM.    12/11/2014 at 1200  . ciprofloxacin (CILOXAN) 0.3 % ophthalmic solution Place 1 drop into the left eye 4 (four) times daily. Until discontinued by MD  5 12/12/2014 at 0800  . Difluprednate 0.05 % EMUL Place 1 drop into the left eye 2 (two) times daily. DUREZOL   12/12/2014 at 0800  . hydrOXYzine (ATARAX/VISTARIL) 25 MG tablet Take 25 mg by mouth every 6 (six) hours as needed for itching.    Past Week at Unknown time  . insulin aspart (NOVOLOG FLEXPEN) 100 UNIT/ML FlexPen Inject 2-15 Units into the skin 3 (three) times daily with meals. Per sliding scale   12/10/2014 at 2000  . megestrol (MEGACE) 40 MG/ML suspension Take 200 mg by mouth daily.    Past Week at Unknown time  . midodrine (PROAMATINE) 10 MG tablet Take 10 mg by mouth daily at 2 PM daily at 2 PM.    12/11/2014 at 2000  . naproxen sodium (ANAPROX) 220 MG tablet Take 220 mg by mouth 2 (two) times daily as needed (pain).  Aleve   Past Month at Unknown time  . pregabalin (LYRICA) 25 MG capsule Take 25 mg by mouth daily at 2 PM daily at 2 PM.    12/11/2014 at 2000  . Propylene Glycol 0.6 % SOLN Place 1 drop into the right eye 3 (three) times daily as needed (dry eyes).   12/11/2014 at 2000  . sevelamer carbonate (RENVELA) 800 MG tablet Take 1,600 mg by mouth daily with lunch.    Past Month at Unknown time  . warfarin (COUMADIN) 4 MG tablet TAKE AS DIRECTED BY COUMADIN CLINIC (Patient taking differently: TAKE 1 1/2 TABLETS (6 MG) ON MONDAY, WEDNESDAY, FRIDAY, TAKE 1 TABLET (4 MG) ON SUNDAY, TUESDAY, THURSDAY, SATURDAY OR AS DIRECTED BY COUMADIN CLINIC) 40 tablet 3 12/07/2014 at 0800   Allergies  Allergen Reactions  . Neurontin [Gabapentin] Anaphylaxis  . Ezetimibe-Simvastatin Itching and Swelling     facial edema  . Klonopin [Clonazepam] Other (See Comments)    hallucination  . Sertraline Hcl Other (See Comments)    hallucination    History  Substance Use Topics  . Smoking status: Never Smoker   . Smokeless tobacco: Former Systems developer    Types: Snuff, Chew  . Alcohol Use: No    Family History  Problem Relation Age of Onset  . Heart disease    . Edema Father     fluid overload  . Diabetes Father   . Cerebral aneurysm Mother   . Diabetes Mother   . Stroke Brother   . Heart disease Brother   . Diabetes Brother   . Hyperlipidemia Brother   . Hypertension Brother   . Peripheral vascular disease Brother     amputation     Objective:   Patient Vitals for the past 8 hrs:  BP Temp Temp src Pulse Resp SpO2 Height Weight  12/12/14 1033 111/82 mmHg 98 F (36.7 C) Oral 97 15 97 % - -  12/12/14 1030 - 98 F (36.7 C) Oral - - - 4\' 8"  (1.422 m) 82 kg (180 lb 12.4 oz)         See MD Preop evaluation      Assessment:   1. History of colon polyps with prior villus polyp removed 2. Chronic diarrhea 3. Multiple chronic medical problems as listed above  Plan:   We will proceed with colonoscopy at this  time with  plans to obtain colonic biopsies with MAC. This is been discussed extensively with the patient.

## 2014-12-12 NOTE — Anesthesia Preprocedure Evaluation (Addendum)
Anesthesia Evaluation  Patient identified by MRN, date of birth, ID band  Reviewed: Allergy & Precautions, NPO status , Patient's Chart, lab work & pertinent test results  Airway Mallampati: II   Neck ROM: Full    Dental  (+) Upper Dentures, Dental Advisory Given   Pulmonary shortness of breath, sleep apnea ,  breath sounds clear to auscultation        Cardiovascular hypertension, Pt. on medications + CAD and + Peripheral Vascular Disease + dysrhythmias Rhythm:Regular  EF 40% 2014 ECHO   Neuro/Psych TIA   GI/Hepatic   Endo/Other  diabetes, Type 2  Renal/GU DialysisRenal disease     Musculoskeletal   Abdominal (+) + obese,   Peds  Hematology  (+) anemia ,   Anesthesia Other Findings   Reproductive/Obstetrics                            Anesthesia Physical Anesthesia Plan  ASA: III  Anesthesia Plan: MAC   Post-op Pain Management:    Induction: Intravenous  Airway Management Planned: Nasal Cannula  Additional Equipment:   Intra-op Plan:   Post-operative Plan:   Informed Consent: I have reviewed the patients History and Physical, chart, labs and discussed the procedure including the risks, benefits and alternatives for the proposed anesthesia with the patient or authorized representative who has indicated his/her understanding and acceptance.     Plan Discussed with:   Anesthesia Plan Comments:         Anesthesia Quick Evaluation

## 2014-12-13 ENCOUNTER — Encounter (HOSPITAL_COMMUNITY): Payer: Self-pay | Admitting: Gastroenterology

## 2014-12-18 ENCOUNTER — Telehealth: Payer: Self-pay | Admitting: *Deleted

## 2014-12-18 NOTE — Telephone Encounter (Signed)
[name] called to report that she is still having blisters and sores on both of her stumps, primarily her right BKA. She is have more soreness on the lateral side of her left AKA where she thinks it is rubbing her wheelchair. She is afebrile and reports that she has had no falls. She says that she is not currently seeing the [facility]. She recently had a colonoscopy but says that the wounds have not changed, even with the colonoscopy prep. She is taking her Coumadin as prescribed with her last INR of 2.5. [name] also reports that her glucose readings have been good, this morning was 112 mg/dL.  We will make her an appt to see our NP and possibly get her back in to see the Wound Clinic or dermatology if deemed necessary.

## 2014-12-20 ENCOUNTER — Encounter: Payer: Self-pay | Admitting: Family

## 2014-12-23 ENCOUNTER — Other Ambulatory Visit: Payer: Self-pay | Admitting: *Deleted

## 2014-12-23 ENCOUNTER — Ambulatory Visit (INDEPENDENT_AMBULATORY_CARE_PROVIDER_SITE_OTHER): Payer: Medicare Other | Admitting: Family

## 2014-12-23 ENCOUNTER — Encounter: Payer: Self-pay | Admitting: Family

## 2014-12-23 VITALS — BP 90/62 | HR 104 | Resp 16 | Ht <= 58 in | Wt 181.9 lb

## 2014-12-23 DIAGNOSIS — N186 End stage renal disease: Secondary | ICD-10-CM | POA: Diagnosis not present

## 2014-12-23 DIAGNOSIS — Z89612 Acquired absence of left leg above knee: Secondary | ICD-10-CM | POA: Diagnosis not present

## 2014-12-23 DIAGNOSIS — I639 Cerebral infarction, unspecified: Secondary | ICD-10-CM | POA: Diagnosis not present

## 2014-12-23 DIAGNOSIS — Z89511 Acquired absence of right leg below knee: Secondary | ICD-10-CM | POA: Diagnosis not present

## 2014-12-23 DIAGNOSIS — Z992 Dependence on renal dialysis: Secondary | ICD-10-CM

## 2014-12-23 DIAGNOSIS — S80811D Abrasion, right lower leg, subsequent encounter: Secondary | ICD-10-CM

## 2014-12-23 DIAGNOSIS — I739 Peripheral vascular disease, unspecified: Secondary | ICD-10-CM | POA: Diagnosis not present

## 2014-12-23 NOTE — Patient Instructions (Signed)

## 2014-12-23 NOTE — Progress Notes (Signed)
[facility] [place] HISTORY AND PHYSICAL -PAD  History of Present Illness [name] is a 59 y.o. female  patient of Dr. Donnetta Hutching who is s/p dril procedure on 11/21/2013.  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  She returns today for C/O intermittent recurrence of blisters on right stump since October, 2014.  C/O Sore on Right stump, duration 1-2 wks, Slowly healing. Pt. needs recommendation form for Medicare; Electronic Chair.  [facility] was also performing right stump dressing changes until she was discharged from the wound care center. She had been going to the wound care center and was discharged from there April or May, 2015. The kidney center gave her a prescription for 5 days of doxycycline which she started yesterday. Pt denies any fever or chills. Pt report no changes in her meds or soap use or different local exposures starting October, 2015. Pt states the wound center did not know what caused her blisters, she has not been using the stump shrinker whenever she has blisters on her stump. I last saw her in September 2015. At that time I recommended to resume wound care center management for the blisters forming on her right BKA stump; pt stated she had an appointment already at the wound care center for June 27, 2014. Continue home dressing changes to right BKA stump: wash daily with soap and water, apply hydrogel, sterile gauze, kerlex wrap, and ace wrap or breathable stocking dressing. Finish the doxycycline 5 day course. She was to return to clinic as needed. She did receive wound care at the center and had resolution of her right BKA stump wounds. She now expresses that the recurrent right BKA blisters are most likely from the shearing/abrasive action of bracing her right stump on her bed or chair as she transfers. She is requesting an appliance or durable dressing to place on her right BKA stump to prevent the  shearing/abrading of her right BKA. She also requests our practice to authorize a new electric wheelchair for her as the one she has was obtained secondhand and barely functions at this point. Pt states her PCP advised pt to request this of our practice.  She states the right thumb necrotic tip is no worse, has some pain there.  She dialyzes M-W-F, using the right upper arm HD access. She has tingling and numbness in both hands, more so in the right hand.  Pt Diabetic: Yes  Pt smoker: non-smoker  Pt meds include: Statin :Yes ASA: Yes Other anticoagulants/antiplatelets: coumadin    Past Medical History  Diagnosis Date  . Diabetes mellitus, type 2   . Secondary hyperparathyroidism   . Ischemic cardiomyopathy     a. EF 25-30% 2011;  b.  Echocardiogram (09/01/2013): EF 30-35%, diffuse hypokinesis, MAC, mild MR, mild LAE, mild RVE, severely reduced RVSF, moderate RAE, mildly increased PASP.  [name] Pulmonary hypertension     Est PAsyst 60-Dr Linard Millers, off coumadin, CT neg for PE  . Hyperlipidemia   . Anemia   . Thyroid disease   . Pulmonary edema   . Peripheral vascular disease     a. Prior LE amputations. b. s/p prior LE bypass surgery.  . Sebaceous cyst   . 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 Richardson Dopp  . Shortness of breath     with exertion  . Hemodialysis patient     M,W,F  . TIA (transient ischemic attack) June 2014  . Ulcer of  heel due to diabetes     left  . Kidney disease Dialysis MWF    ESRD secondary to DM, failed transplant 2006. Dialysis  . Atrial fibrillation     a. Dx 08/2013, placed on amiodarone.  . Chronic systolic CHF (congestive heart failure)   . CAD (coronary artery disease)     a. s/p CABG 2011.;  b. Lexiscan Myoview (02/2013): Intermediate risk study; EF 50%, basal inferolateral, basal anterolateral and mid inferolateral moderate ischemia; basal anterior, basal anteroseptal and mid anterior scar;  c. Type 2 NSTEMI during admission for LE revasc  surgery 02/2013 in setting of SVT  . Polyneuropathy in diabetes(357.2) 10/31/2013  . Neuropathy   . OSA (obstructive sleep apnea)     uses c-papNPSG 11.23.11-AHI 64.9/hr-central and obstructive apnea  . Headache   . Carpal tunnel syndrome, right     Social History History  Substance Use Topics  . Smoking status: Never Smoker   . Smokeless tobacco: Former Systems developer    Types: Snuff, Chew  . Alcohol Use: No    Family History Family History  Problem Relation Age of Onset  . Heart disease    . Edema Father     fluid overload  . Diabetes Father   . Cerebral aneurysm Mother   . Diabetes Mother   . Stroke Brother   . Heart disease Brother   . Diabetes Brother   . Hyperlipidemia Brother   . Hypertension Brother   . Peripheral vascular disease Brother     amputation    Past Surgical History  Procedure Laterality Date  . Coronary artery bypass graft  2011    3V  . Insertion of dialysis catheter    . Above knee leg amputation Left     infection/gangrene 10/11/07  . Nasal mucosal biopsy    . Ovarian cyst surgery    . 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 Laser retinal surgery  2011  . Kidney transplant      rejection  . Abdominal angiogram Left 01/25/13  . Colonoscopy    . Cardiac catheterization    . Femoral-popliteal bypass graft Left 02/20/2013    Procedure:  FEMORAL-to below knee POPLITEAL ARTERY bypasswith saphenous vein with intraoperative angiogram ;  Surgeon: Angelia Mould, MD;  Location: [facility];  Service: Vascular;  Laterality: Left;  fem-below knee pop  . Amputation Left 03/27/2013    Procedure: AMPUTATION ABOVE KNEE;  Surgeon: Angelia Mould, MD;  Location: [facility];  Service: Vascular;  Laterality: Left;  . Tee without cardioversion N/A 09/11/2013    Procedure: TRANSESOPHAGEAL ECHOCARDIOGRAM (TEE);  Surgeon: Casandra Doffing, MD;  Location: [facility] ENDOSCOPY;  Service: Cardiovascular;  Laterality: N/A;  . Cardioversion N/A 09/11/2013     Procedure: CARDIOVERSION;  Surgeon: Casandra Doffing, MD;  Location: [facility] ENDOSCOPY;  Service: Cardiovascular;  Laterality: N/A;  . Below knee leg amputation Right   . Revison of arteriovenous fistula Right 11/21/2013    Procedure: REVISON OF ARTERIOVENOUS FISTULA;  Surgeon: Rosetta Posner, MD;  Location: [facility];  Service: Vascular;  Laterality: Right;  . Cataract surgery Right 01-07-2014  . Lower extremity angiogram Left 01/25/2013    Procedure: LOWER EXTREMITY ANGIOGRAM;  Surgeon: Conrad [place], MD;  Location: [facility] CATH LAB;  Service: Cardiovascular;  Laterality: Left;  . Colonoscopy with propofol N/A 12/12/2014    Procedure: COLONOSCOPY WITH PROPOFOL;  Surgeon: Laurence Spates, MD;  Location: [facility];  Service: Endoscopy;  Laterality: N/A;  . Eye  surgery    . Eye surgery Left November 28, 2014    Laser     Allergies  Allergen Reactions  . Neurontin [Gabapentin] Anaphylaxis  . Ezetimibe-Simvastatin Itching and Swelling     facial edema  . Klonopin [Clonazepam] Other (See Comments)    hallucination  . Sertraline Hcl Other (See Comments)    hallucination    Current Outpatient Prescriptions  Medication Sig Dispense Refill  . acetaminophen (TYLENOL) 325 MG tablet Take 650-975 mg by mouth 3 (three) times daily as needed (pain).     [name] amiodarone (PACERONE) 200 MG tablet Take 1 tablet (200 mg total) by mouth daily. (Patient taking differently: Take 200 mg by mouth daily at 2 PM daily at 2 PM. ) 30 tablet 11  . aspirin EC 81 MG tablet Take 81 mg by mouth daily.    [name] atorvastatin (LIPITOR) 10 MG tablet Take 10 mg by mouth daily at 2 PM daily at 2 PM.     . B Complex-C (SUPER B COMPLEX PO) Take 1 tablet by mouth daily at 2 PM daily at 2 PM.    . B Complex-C-Folic Acid (DIALYVITE TABLET) TABS Take 1 tablet by mouth daily at 2 PM daily at 2 PM.   3  . cinacalcet (SENSIPAR) 60 MG tablet Take 60 mg by mouth daily at 2 PM daily at 2 PM.     . ciprofloxacin (CILOXAN) 0.3 % ophthalmic solution Place 1 drop into  the left eye 4 (four) times daily. Until discontinued by MD  5  . Difluprednate 0.05 % EMUL Place 1 drop into the left eye 2 (two) times daily. DUREZOL    . hydrOXYzine (ATARAX/VISTARIL) 25 MG tablet Take 25 mg by mouth every 6 (six) hours as needed for itching.     . insulin aspart (NOVOLOG FLEXPEN) 100 UNIT/ML FlexPen Inject 2-15 Units into the skin 3 (three) times daily with meals. Per sliding scale    . megestrol (MEGACE) 40 MG/ML suspension Take 200 mg by mouth daily.     . midodrine (PROAMATINE) 10 MG tablet Take 10 mg by mouth daily at 2 PM daily at 2 PM.     . naproxen sodium (ANAPROX) 220 MG tablet Take 220 mg by mouth 2 (two) times daily as needed (pain). Aleve    . pregabalin (LYRICA) 25 MG capsule Take 25 mg by mouth daily at 2 PM daily at 2 PM.     . Propylene Glycol 0.6 % SOLN Place 1 drop into the right eye 3 (three) times daily as needed (dry eyes).    . sevelamer carbonate (RENVELA) 800 MG tablet Take 1,600 mg by mouth daily with lunch.     . warfarin (COUMADIN) 4 MG tablet 4 mg. Take One and a half tablet Mon-Wed-Frid  And  Take one tablet Sun-Tue-Thur- Sat.     No current facility-administered medications for this visit.    ROS: See HPI for pertinent positives and negatives.   Physical Examination  Filed Vitals:   12/23/14 1214  BP: 90/62  Pulse: 104  Resp: 16  Height: 4\' 8"  (1.422 m)  Weight: 181 lb 14.1 oz (82.5 kg)  SpO2: 98%   Body mass index is 40.8 kg/(m^2).  General: A&O x 3, WDWN.  Gait: in her motorized wheelchair  Eyes: No gross abnormalities  Pulmonary: Respirations are non labored  Cardiac: regular Rhythm. Right upper arm access has a palpable thrill.  Radial pulses: faintly palpable bilaterally.   VASCULAR EXAM:  Extremities  with ischemic changes: necrotic tip of right thumb, no signs of infection, no swelling, no drainage  without Gangrene; small healing abrasion at right BKA.  LE Pulses  LEFT  RIGHT   FEMORAL  not palpable   not palpable   POPLITEAL  AKA  not palpable   POSTERIOR TIBIAL  AKA  BKA   DORSALIS PEDIS  ANTERIOR TIBIAL  AKA  BKA    Abdomen: soft, NT, no palpated masses.  Skin: no rashes,see extremities  Musculoskeletal: right BKA, left AKA  Neurologic: A&O X 3; Appropriate Affect ; SENSATION: normal; MOTOR FUNCTION: moving all extremities equally, motor strength 4/5 throughout. Speech is fluent/normal. CN 2-12 grossly intact         ASSESSMENT: [name] is a 59 y.o. female who is s/p dril procedure on 11/21/2013.  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  She returns today for C/O intermittent recurrence of blisters on right stump since October, 2014. The abrasion on her right BKA is healing with no open wound. She feels that the recurrent right BKA blisters are from shearing/abrading action of bracing her right AKA on her bed or chair when she transfers. She is also requesting authorization for a functioning new electric w/c. See Plan.   PLAN:  Pt referred to her PCP for authorization of electric w/c. Since [employer] has worked with her before re her stump shrinkers and LE prosthetics, pt requested an evaluation and prosthetic device to protect her right BKA from further skin breakdown when she uses her right BKA to brace for transfer; referral given.   I discussed in depth with the patient the nature of atherosclerosis, and emphasized the importance of maximal medical management including strict control of blood pressure, blood glucose, and lipid levels, obtaining regular exercise, and continued cessation of smoking.  The patient is aware that without maximal medical management the underlying atherosclerotic disease process will progress, limiting the benefit of any interventions.  Based on the patient's vascular studies and examination, pt will return to clinic as needed.  The patient was given information about PAD including signs,  symptoms, treatment, what symptoms should prompt the patient to seek immediate medical care, and risk reduction measures to take.  Clemon Chambers, RN, MSN, FNP-C [facility] Phone: [phone]  Clinic MD: Trula Slade  12/23/2014 12:43 PM

## 2014-12-24 ENCOUNTER — Ambulatory Visit (INDEPENDENT_AMBULATORY_CARE_PROVIDER_SITE_OTHER): Payer: Medicare Other | Admitting: Pharmacist Clinician (PhC)/ Clinical Pharmacy Specialist

## 2014-12-24 DIAGNOSIS — Z7901 Long term (current) use of anticoagulants: Secondary | ICD-10-CM

## 2014-12-24 DIAGNOSIS — I639 Cerebral infarction, unspecified: Secondary | ICD-10-CM | POA: Diagnosis not present

## 2014-12-24 DIAGNOSIS — Z5181 Encounter for therapeutic drug level monitoring: Secondary | ICD-10-CM | POA: Diagnosis not present

## 2014-12-24 DIAGNOSIS — I4891 Unspecified atrial fibrillation: Secondary | ICD-10-CM | POA: Diagnosis not present

## 2014-12-24 LAB — POCT INR: INR: 1.6

## 2015-01-07 ENCOUNTER — Ambulatory Visit (INDEPENDENT_AMBULATORY_CARE_PROVIDER_SITE_OTHER): Payer: Medicare Other

## 2015-01-07 DIAGNOSIS — Z5181 Encounter for therapeutic drug level monitoring: Secondary | ICD-10-CM | POA: Diagnosis not present

## 2015-01-07 DIAGNOSIS — I639 Cerebral infarction, unspecified: Secondary | ICD-10-CM

## 2015-01-07 DIAGNOSIS — Z7901 Long term (current) use of anticoagulants: Secondary | ICD-10-CM

## 2015-01-07 DIAGNOSIS — I4891 Unspecified atrial fibrillation: Secondary | ICD-10-CM | POA: Diagnosis not present

## 2015-01-07 LAB — POCT INR: INR: >8

## 2015-01-07 LAB — PROTIME-INR
INR: 8.3 ratio — AB (ref 0.8–1.0)
Prothrombin Time: 87.2 s (ref 9.6–13.1)

## 2015-01-14 ENCOUNTER — Ambulatory Visit (INDEPENDENT_AMBULATORY_CARE_PROVIDER_SITE_OTHER): Payer: Medicare Other | Admitting: *Deleted

## 2015-01-14 DIAGNOSIS — Z5181 Encounter for therapeutic drug level monitoring: Secondary | ICD-10-CM

## 2015-01-14 DIAGNOSIS — I4891 Unspecified atrial fibrillation: Secondary | ICD-10-CM | POA: Diagnosis not present

## 2015-01-14 DIAGNOSIS — I639 Cerebral infarction, unspecified: Secondary | ICD-10-CM

## 2015-01-14 DIAGNOSIS — Z7901 Long term (current) use of anticoagulants: Secondary | ICD-10-CM

## 2015-01-14 LAB — POCT INR: INR: 3.7

## 2015-01-16 ENCOUNTER — Ambulatory Visit: Payer: Medicare Other | Admitting: Family

## 2015-01-20 ENCOUNTER — Other Ambulatory Visit: Payer: Self-pay | Admitting: Orthopedic Surgery

## 2015-01-21 ENCOUNTER — Ambulatory Visit (INDEPENDENT_AMBULATORY_CARE_PROVIDER_SITE_OTHER): Payer: Medicare Other | Admitting: Surgery

## 2015-01-21 ENCOUNTER — Telehealth: Payer: Self-pay | Admitting: *Deleted

## 2015-01-21 DIAGNOSIS — I639 Cerebral infarction, unspecified: Secondary | ICD-10-CM | POA: Diagnosis not present

## 2015-01-21 DIAGNOSIS — Z7901 Long term (current) use of anticoagulants: Secondary | ICD-10-CM | POA: Diagnosis not present

## 2015-01-21 DIAGNOSIS — Z5181 Encounter for therapeutic drug level monitoring: Secondary | ICD-10-CM | POA: Diagnosis not present

## 2015-01-21 DIAGNOSIS — I4891 Unspecified atrial fibrillation: Secondary | ICD-10-CM | POA: Diagnosis not present

## 2015-01-21 LAB — PROTIME-INR
INR: 7 ratio — AB (ref 0.8–1.0)
Prothrombin Time: 73.4 s (ref 9.6–13.1)

## 2015-01-21 LAB — POCT INR: INR: 7.6

## 2015-01-21 NOTE — Telephone Encounter (Signed)
Result noted, see anticoagulation note in Epic. 

## 2015-01-21 NOTE — Telephone Encounter (Signed)
Received phone call from [facility] lab;  Pt w/ critical INR--7.0, PT 73.4 Spoke with Gay Filler, PharmD in coumadin clinic to inform and forwarding to their pool.

## 2015-01-24 ENCOUNTER — Ambulatory Visit (INDEPENDENT_AMBULATORY_CARE_PROVIDER_SITE_OTHER): Payer: Medicare Other | Admitting: Pharmacist

## 2015-01-24 DIAGNOSIS — Z5181 Encounter for therapeutic drug level monitoring: Secondary | ICD-10-CM

## 2015-01-24 DIAGNOSIS — I4891 Unspecified atrial fibrillation: Secondary | ICD-10-CM

## 2015-01-24 DIAGNOSIS — I639 Cerebral infarction, unspecified: Secondary | ICD-10-CM | POA: Diagnosis not present

## 2015-01-24 DIAGNOSIS — Z7901 Long term (current) use of anticoagulants: Secondary | ICD-10-CM | POA: Diagnosis not present

## 2015-01-24 LAB — POCT INR: INR: 1.7

## 2015-01-27 ENCOUNTER — Encounter (HOSPITAL_BASED_OUTPATIENT_CLINIC_OR_DEPARTMENT_OTHER): Payer: Self-pay | Admitting: *Deleted

## 2015-01-27 NOTE — Progress Notes (Signed)
Patient coming in Wednesday 01-29-15 for PT/INR, BMET, and EKG. Pt has dialysis on Wed also.

## 2015-01-29 ENCOUNTER — Other Ambulatory Visit: Payer: Self-pay

## 2015-01-29 ENCOUNTER — Encounter (HOSPITAL_BASED_OUTPATIENT_CLINIC_OR_DEPARTMENT_OTHER)
Admission: RE | Admit: 2015-01-29 | Discharge: 2015-01-29 | Disposition: A | Discharge status: Home / Self Care | Payer: Medicare Other | Source: Ambulatory Visit | Attending: Orthopedic Surgery | Admitting: Orthopedic Surgery

## 2015-01-29 DIAGNOSIS — Z89612 Acquired absence of left leg above knee: Secondary | ICD-10-CM | POA: Diagnosis not present

## 2015-01-29 DIAGNOSIS — Z888 Allergy status to other drugs, medicaments and biological substances status: Secondary | ICD-10-CM | POA: Diagnosis not present

## 2015-01-29 DIAGNOSIS — I5022 Chronic systolic (congestive) heart failure: Secondary | ICD-10-CM | POA: Diagnosis not present

## 2015-01-29 DIAGNOSIS — Z7982 Long term (current) use of aspirin: Secondary | ICD-10-CM | POA: Diagnosis not present

## 2015-01-29 DIAGNOSIS — I272 Other secondary pulmonary hypertension: Secondary | ICD-10-CM | POA: Diagnosis not present

## 2015-01-29 DIAGNOSIS — N2581 Secondary hyperparathyroidism of renal origin: Secondary | ICD-10-CM | POA: Diagnosis not present

## 2015-01-29 DIAGNOSIS — Z951 Presence of aortocoronary bypass graft: Secondary | ICD-10-CM | POA: Diagnosis not present

## 2015-01-29 DIAGNOSIS — I252 Old myocardial infarction: Secondary | ICD-10-CM | POA: Diagnosis not present

## 2015-01-29 DIAGNOSIS — Z7901 Long term (current) use of anticoagulants: Secondary | ICD-10-CM | POA: Diagnosis not present

## 2015-01-29 DIAGNOSIS — I4891 Unspecified atrial fibrillation: Secondary | ICD-10-CM | POA: Diagnosis not present

## 2015-01-29 DIAGNOSIS — Z79899 Other long term (current) drug therapy: Secondary | ICD-10-CM | POA: Diagnosis not present

## 2015-01-29 DIAGNOSIS — Z9861 Coronary angioplasty status: Secondary | ICD-10-CM | POA: Diagnosis not present

## 2015-01-29 DIAGNOSIS — E1122 Type 2 diabetes mellitus with diabetic chronic kidney disease: Secondary | ICD-10-CM | POA: Diagnosis not present

## 2015-01-29 DIAGNOSIS — Z940 Kidney transplant status: Secondary | ICD-10-CM | POA: Diagnosis not present

## 2015-01-29 DIAGNOSIS — Y838 Other surgical procedures as the cause of abnormal reaction of the patient, or of later complication, without mention of misadventure at the time of the procedure: Secondary | ICD-10-CM | POA: Diagnosis not present

## 2015-01-29 DIAGNOSIS — Z992 Dependence on renal dialysis: Secondary | ICD-10-CM | POA: Diagnosis not present

## 2015-01-29 DIAGNOSIS — N186 End stage renal disease: Secondary | ICD-10-CM | POA: Diagnosis not present

## 2015-01-29 DIAGNOSIS — E1142 Type 2 diabetes mellitus with diabetic polyneuropathy: Secondary | ICD-10-CM | POA: Diagnosis not present

## 2015-01-29 DIAGNOSIS — I120 Hypertensive chronic kidney disease with stage 5 chronic kidney disease or end stage renal disease: Secondary | ICD-10-CM | POA: Diagnosis not present

## 2015-01-29 DIAGNOSIS — M329 Systemic lupus erythematosus, unspecified: Secondary | ICD-10-CM | POA: Diagnosis not present

## 2015-01-29 DIAGNOSIS — Z794 Long term (current) use of insulin: Secondary | ICD-10-CM | POA: Diagnosis not present

## 2015-01-29 DIAGNOSIS — Z8673 Personal history of transient ischemic attack (TIA), and cerebral infarction without residual deficits: Secondary | ICD-10-CM | POA: Diagnosis not present

## 2015-01-29 DIAGNOSIS — I739 Peripheral vascular disease, unspecified: Secondary | ICD-10-CM | POA: Diagnosis not present

## 2015-01-29 DIAGNOSIS — D649 Anemia, unspecified: Secondary | ICD-10-CM | POA: Diagnosis not present

## 2015-01-29 DIAGNOSIS — E785 Hyperlipidemia, unspecified: Secondary | ICD-10-CM | POA: Diagnosis not present

## 2015-01-29 DIAGNOSIS — I255 Ischemic cardiomyopathy: Secondary | ICD-10-CM | POA: Diagnosis not present

## 2015-01-29 DIAGNOSIS — G4733 Obstructive sleep apnea (adult) (pediatric): Secondary | ICD-10-CM | POA: Diagnosis not present

## 2015-01-29 DIAGNOSIS — I2510 Atherosclerotic heart disease of native coronary artery without angina pectoris: Secondary | ICD-10-CM | POA: Diagnosis not present

## 2015-01-29 DIAGNOSIS — T8131XA Disruption of external operation (surgical) wound, not elsewhere classified, initial encounter: Secondary | ICD-10-CM | POA: Diagnosis not present

## 2015-01-29 LAB — PROTIME-INR
INR: 2.01 — AB (ref 0.00–1.49)
Prothrombin Time: 22.9 seconds — ABNORMAL HIGH (ref 11.6–15.2)

## 2015-01-29 LAB — BASIC METABOLIC PANEL
Anion gap: 6 (ref 5–15)
BUN: 9 mg/dL (ref 6–20)
CO2: 30 mmol/L (ref 22–32)
Calcium: 8 mg/dL — ABNORMAL LOW (ref 8.9–10.3)
Chloride: 98 mmol/L — ABNORMAL LOW (ref 101–111)
Creatinine, Ser: 2.17 mg/dL — ABNORMAL HIGH (ref 0.44–1.00)
GFR calc Af Amer: 27 mL/min — ABNORMAL LOW (ref >60)
GFR, EST NON AFRICAN AMERICAN: 24 mL/min — AB (ref >60)
Glucose, Bld: 97 mg/dL (ref 65–99)
Potassium: 3.7 mmol/L (ref 3.5–5.1)
Sodium: 134 mmol/L — ABNORMAL LOW (ref 135–145)

## 2015-01-29 NOTE — Progress Notes (Signed)
PT/INR results called to Odessa Fleming at Dr Levell July office. OK for surgery tomorrow.

## 2015-01-30 ENCOUNTER — Ambulatory Visit (HOSPITAL_BASED_OUTPATIENT_CLINIC_OR_DEPARTMENT_OTHER): Payer: Medicare Other | Admitting: Certified Registered"

## 2015-01-30 ENCOUNTER — Ambulatory Visit (HOSPITAL_BASED_OUTPATIENT_CLINIC_OR_DEPARTMENT_OTHER)
Admission: RE | Admit: 2015-01-30 | Discharge: 2015-01-30 | Disposition: A | Discharge status: Home / Self Care | Payer: Medicare Other | Source: Ambulatory Visit | Attending: Orthopedic Surgery | Admitting: Orthopedic Surgery

## 2015-01-30 ENCOUNTER — Encounter (HOSPITAL_BASED_OUTPATIENT_CLINIC_OR_DEPARTMENT_OTHER)
Admission: RE | Disposition: A | Discharge status: Home / Self Care | Payer: Self-pay | Source: Ambulatory Visit | Attending: Orthopedic Surgery

## 2015-01-30 ENCOUNTER — Encounter (HOSPITAL_BASED_OUTPATIENT_CLINIC_OR_DEPARTMENT_OTHER): Payer: Self-pay

## 2015-01-30 DIAGNOSIS — I272 Other secondary pulmonary hypertension: Secondary | ICD-10-CM | POA: Insufficient documentation

## 2015-01-30 DIAGNOSIS — I4891 Unspecified atrial fibrillation: Secondary | ICD-10-CM | POA: Insufficient documentation

## 2015-01-30 DIAGNOSIS — Z79899 Other long term (current) drug therapy: Secondary | ICD-10-CM | POA: Insufficient documentation

## 2015-01-30 DIAGNOSIS — Z940 Kidney transplant status: Secondary | ICD-10-CM | POA: Insufficient documentation

## 2015-01-30 DIAGNOSIS — Z89612 Acquired absence of left leg above knee: Secondary | ICD-10-CM | POA: Insufficient documentation

## 2015-01-30 DIAGNOSIS — I739 Peripheral vascular disease, unspecified: Secondary | ICD-10-CM | POA: Insufficient documentation

## 2015-01-30 DIAGNOSIS — N2581 Secondary hyperparathyroidism of renal origin: Secondary | ICD-10-CM | POA: Insufficient documentation

## 2015-01-30 DIAGNOSIS — Z888 Allergy status to other drugs, medicaments and biological substances status: Secondary | ICD-10-CM | POA: Insufficient documentation

## 2015-01-30 DIAGNOSIS — I255 Ischemic cardiomyopathy: Secondary | ICD-10-CM | POA: Insufficient documentation

## 2015-01-30 DIAGNOSIS — Z9861 Coronary angioplasty status: Secondary | ICD-10-CM | POA: Insufficient documentation

## 2015-01-30 DIAGNOSIS — Z951 Presence of aortocoronary bypass graft: Secondary | ICD-10-CM | POA: Insufficient documentation

## 2015-01-30 DIAGNOSIS — I252 Old myocardial infarction: Secondary | ICD-10-CM | POA: Insufficient documentation

## 2015-01-30 DIAGNOSIS — T8131XA Disruption of external operation (surgical) wound, not elsewhere classified, initial encounter: Secondary | ICD-10-CM | POA: Diagnosis not present

## 2015-01-30 DIAGNOSIS — D649 Anemia, unspecified: Secondary | ICD-10-CM | POA: Insufficient documentation

## 2015-01-30 DIAGNOSIS — Z794 Long term (current) use of insulin: Secondary | ICD-10-CM | POA: Insufficient documentation

## 2015-01-30 DIAGNOSIS — I120 Hypertensive chronic kidney disease with stage 5 chronic kidney disease or end stage renal disease: Secondary | ICD-10-CM | POA: Insufficient documentation

## 2015-01-30 DIAGNOSIS — Z7982 Long term (current) use of aspirin: Secondary | ICD-10-CM | POA: Insufficient documentation

## 2015-01-30 DIAGNOSIS — Y838 Other surgical procedures as the cause of abnormal reaction of the patient, or of later complication, without mention of misadventure at the time of the procedure: Secondary | ICD-10-CM | POA: Insufficient documentation

## 2015-01-30 DIAGNOSIS — Z7901 Long term (current) use of anticoagulants: Secondary | ICD-10-CM | POA: Insufficient documentation

## 2015-01-30 DIAGNOSIS — Z992 Dependence on renal dialysis: Secondary | ICD-10-CM | POA: Insufficient documentation

## 2015-01-30 DIAGNOSIS — M329 Systemic lupus erythematosus, unspecified: Secondary | ICD-10-CM | POA: Insufficient documentation

## 2015-01-30 DIAGNOSIS — N186 End stage renal disease: Secondary | ICD-10-CM | POA: Insufficient documentation

## 2015-01-30 DIAGNOSIS — E1142 Type 2 diabetes mellitus with diabetic polyneuropathy: Secondary | ICD-10-CM | POA: Insufficient documentation

## 2015-01-30 DIAGNOSIS — I5022 Chronic systolic (congestive) heart failure: Secondary | ICD-10-CM | POA: Insufficient documentation

## 2015-01-30 DIAGNOSIS — E785 Hyperlipidemia, unspecified: Secondary | ICD-10-CM | POA: Insufficient documentation

## 2015-01-30 DIAGNOSIS — G4733 Obstructive sleep apnea (adult) (pediatric): Secondary | ICD-10-CM | POA: Insufficient documentation

## 2015-01-30 DIAGNOSIS — E1122 Type 2 diabetes mellitus with diabetic chronic kidney disease: Secondary | ICD-10-CM | POA: Insufficient documentation

## 2015-01-30 DIAGNOSIS — Z8673 Personal history of transient ischemic attack (TIA), and cerebral infarction without residual deficits: Secondary | ICD-10-CM | POA: Insufficient documentation

## 2015-01-30 DIAGNOSIS — I2510 Atherosclerotic heart disease of native coronary artery without angina pectoris: Secondary | ICD-10-CM | POA: Insufficient documentation

## 2015-01-30 HISTORY — PX: I & D EXTREMITY: SHX5045

## 2015-01-30 LAB — GLUCOSE, CAPILLARY
Glucose-Capillary: 89 mg/dL (ref 65–99)
Glucose-Capillary: 104 mg/dL — ABNORMAL HIGH (ref 65–99)

## 2015-01-30 SURGERY — IRRIGATION AND DEBRIDEMENT EXTREMITY
Anesthesia: Monitor Anesthesia Care | Site: Finger | Laterality: Right

## 2015-01-30 MED ORDER — LIDOCAINE HCL (PF) 1 % IJ SOLN
INTRAMUSCULAR | Status: AC
  Filled 2015-01-30: qty 30

## 2015-01-30 MED ORDER — CEFAZOLIN SODIUM-DEXTROSE 2-3 GM-% IV SOLR
2.0000 g | INTRAVENOUS | Status: AC
  Administered 2015-01-30: 2 g via INTRAVENOUS

## 2015-01-30 MED ORDER — MEPERIDINE HCL 25 MG/ML IJ SOLN: 6.2500 mg | INTRAMUSCULAR | Status: DC | PRN

## 2015-01-30 MED ORDER — MIDAZOLAM HCL 2 MG/2ML IJ SOLN
1.0000 mg | INTRAMUSCULAR | Status: DC | PRN
Start: 2015-01-30 — End: 2015-01-30
  Administered 2015-01-30: 1 mg via INTRAVENOUS

## 2015-01-30 MED ORDER — PROPOFOL 500 MG/50ML IV EMUL
INTRAVENOUS | Status: AC
  Filled 2015-01-30: qty 50

## 2015-01-30 MED ORDER — METOPROLOL TARTRATE 1 MG/ML IV SOLN: 2.5000 mg | INTRAVENOUS | Status: DC | PRN

## 2015-01-30 MED ORDER — FENTANYL CITRATE (PF) 100 MCG/2ML IJ SOLN
INTRAMUSCULAR | Status: DC | PRN
  Administered 2015-01-30: 50 ug via INTRAVENOUS

## 2015-01-30 MED ORDER — ONDANSETRON HCL 4 MG/2ML IJ SOLN
INTRAMUSCULAR | Status: DC | PRN
  Administered 2015-01-30: 4 mg via INTRAVENOUS

## 2015-01-30 MED ORDER — PROPOFOL INFUSION 10 MG/ML OPTIME
INTRAVENOUS | Status: DC | PRN
  Administered 2015-01-30: 25 ug/kg/min via INTRAVENOUS

## 2015-01-30 MED ORDER — LIDOCAINE HCL 1 % IJ SOLN
INTRAMUSCULAR | Status: DC | PRN
  Administered 2015-01-30: 10 mL via INTRAMUSCULAR

## 2015-01-30 MED ORDER — HYDROCODONE-ACETAMINOPHEN 5-325 MG PO TABS: ORAL_TABLET | ORAL | Status: DC

## 2015-01-30 MED ORDER — FENTANYL CITRATE (PF) 100 MCG/2ML IJ SOLN
INTRAMUSCULAR | Status: AC
  Filled 2015-01-30: qty 2

## 2015-01-30 MED ORDER — EPHEDRINE SULFATE 50 MG/ML IJ SOLN
INTRAMUSCULAR | Status: DC | PRN
  Administered 2015-01-30: 10 mg via INTRAVENOUS

## 2015-01-30 MED ORDER — LACTATED RINGERS IV SOLN
INTRAVENOUS | Status: DC
Start: 2015-01-30 — End: 2015-01-30

## 2015-01-30 MED ORDER — SODIUM CHLORIDE 0.9 % IV SOLN
INTRAVENOUS | Status: DC
  Administered 2015-01-30 (×2): via INTRAVENOUS

## 2015-01-30 MED ORDER — PHENYLEPHRINE HCL 10 MG/ML IJ SOLN
INTRAMUSCULAR | Status: DC | PRN
  Administered 2015-01-30 (×3): 40 ug via INTRAVENOUS

## 2015-01-30 MED ORDER — FENTANYL CITRATE 0.05 MG/ML IJ SOLN: 25.0000 ug | INTRAMUSCULAR | Status: DC | PRN

## 2015-01-30 MED ORDER — CEFAZOLIN SODIUM-DEXTROSE 2-3 GM-% IV SOLR
INTRAVENOUS | Status: AC
  Filled 2015-01-30: qty 50

## 2015-01-30 MED ORDER — GLYCOPYRROLATE 0.2 MG/ML IJ SOLN: 0.2000 mg | Freq: Once | INTRAMUSCULAR | Status: DC | PRN

## 2015-01-30 MED ORDER — HYDROMORPHONE HCL 1 MG/ML IJ SOLN: 0.2500 mg | INTRAMUSCULAR | Status: DC | PRN

## 2015-01-30 MED ORDER — CHLORHEXIDINE GLUCONATE 4 % EX LIQD: 60.0000 mL | Freq: Once | CUTANEOUS | Status: DC

## 2015-01-30 MED ORDER — FENTANYL CITRATE (PF) 100 MCG/2ML IJ SOLN: 50.0000 ug | INTRAMUSCULAR | Status: DC | PRN

## 2015-01-30 MED ORDER — MIDAZOLAM HCL 2 MG/2ML IJ SOLN
INTRAMUSCULAR | Status: AC
  Filled 2015-01-30: qty 2

## 2015-01-30 MED ORDER — LIDOCAINE HCL (CARDIAC) 20 MG/ML IV SOLN
INTRAVENOUS | Status: DC | PRN
  Administered 2015-01-30: 30 mg via INTRAVENOUS

## 2015-01-30 MED ORDER — PROMETHAZINE HCL 25 MG/ML IJ SOLN: 6.2500 mg | INTRAMUSCULAR | Status: DC | PRN

## 2015-01-30 MED ORDER — BUPIVACAINE HCL (PF) 0.25 % IJ SOLN
INTRAMUSCULAR | Status: AC
  Filled 2015-01-30: qty 30

## 2015-01-30 MED ORDER — ALBUMIN HUMAN 5 % IV SOLN
INTRAVENOUS | Status: DC | PRN
  Administered 2015-01-30: 09:00:00 via INTRAVENOUS

## 2015-01-30 MED ORDER — ESMOLOL HCL 10 MG/ML IV SOLN
INTRAVENOUS | Status: DC | PRN
  Administered 2015-01-30 (×3): 20 mg via INTRAVENOUS

## 2015-01-30 SURGICAL SUPPLY — 61 items
BAG DECANTER FOR FLEXI CONT (MISCELLANEOUS) IMPLANT
BANDAGE ELASTIC 3 VELCRO ST LF (GAUZE/BANDAGES/DRESSINGS) IMPLANT
BLADE MINI RND TIP GREEN BEAV (BLADE) IMPLANT
BLADE SURG 15 STRL LF DISP TIS (BLADE) ×2 IMPLANT
BLADE SURG 15 STRL SS (BLADE) ×6
BNDG CMPR 9X4 STRL LF SNTH (GAUZE/BANDAGES/DRESSINGS)
BNDG CMPR MD 5X2 ELC HKLP STRL (GAUZE/BANDAGES/DRESSINGS)
BNDG COHESIVE 1X5 TAN STRL LF (GAUZE/BANDAGES/DRESSINGS) ×2 IMPLANT
BNDG ELASTIC 2 VLCR STRL LF (GAUZE/BANDAGES/DRESSINGS) IMPLANT
BNDG ESMARK 4X9 LF (GAUZE/BANDAGES/DRESSINGS) IMPLANT
BNDG GAUZE 1X2.1 STRL (MISCELLANEOUS) IMPLANT
BNDG GAUZE ELAST 4 BULKY (GAUZE/BANDAGES/DRESSINGS) IMPLANT
BRUSH SCRUB EZ PLAIN DRY (MISCELLANEOUS) ×3 IMPLANT
CHLORAPREP W/TINT 26ML (MISCELLANEOUS) ×1 IMPLANT
CORDS BIPOLAR (ELECTRODE) ×3 IMPLANT
COVER BACK TABLE 60X90IN (DRAPES) ×3 IMPLANT
COVER MAYO STAND STRL (DRAPES) ×3 IMPLANT
CUFF TOURNIQUET SINGLE 18IN (TOURNIQUET CUFF) ×3 IMPLANT
DRAPE EXTREMITY T 121X128X90 (DRAPE) ×3 IMPLANT
DRAPE SURG 17X23 STRL (DRAPES) ×3 IMPLANT
DRSG EMULSION OIL 3X3 NADH (GAUZE/BANDAGES/DRESSINGS) ×2 IMPLANT
GAUZE PACKING IODOFORM 1/4X15 (GAUZE/BANDAGES/DRESSINGS) IMPLANT
GAUZE SPONGE 4X4 12PLY STRL (GAUZE/BANDAGES/DRESSINGS) ×3 IMPLANT
GAUZE XEROFORM 1X8 LF (GAUZE/BANDAGES/DRESSINGS) ×1 IMPLANT
GLOVE BIO SURGEON STRL SZ7.5 (GLOVE) ×3 IMPLANT
GLOVE BIOGEL PI IND STRL 7.0 (GLOVE) IMPLANT
GLOVE BIOGEL PI IND STRL 7.5 (GLOVE) IMPLANT
GLOVE BIOGEL PI IND STRL 8 (GLOVE) ×1 IMPLANT
GLOVE BIOGEL PI INDICATOR 7.0 (GLOVE) ×2
GLOVE BIOGEL PI INDICATOR 7.5 (GLOVE) ×2
GLOVE BIOGEL PI INDICATOR 8 (GLOVE) ×2
GLOVE ECLIPSE 6.5 STRL STRAW (GLOVE) ×2 IMPLANT
GLOVE EXAM NITRILE MD LF STRL (GLOVE) ×2 IMPLANT
GLOVE SURG SS PI 7.0 STRL IVOR (GLOVE) ×2 IMPLANT
GOWN STRL REUS W/ TWL LRG LVL3 (GOWN DISPOSABLE) ×1 IMPLANT
GOWN STRL REUS W/TWL LRG LVL3 (GOWN DISPOSABLE) ×6
GOWN STRL REUS W/TWL XL LVL3 (GOWN DISPOSABLE) ×3 IMPLANT
LOOP VESSEL MAXI BLUE (MISCELLANEOUS) IMPLANT
MICROMATRIX 500MG (Tissue) ×3 IMPLANT
NDL HYPO 25X1 1.5 SAFETY (NEEDLE) IMPLANT
NEEDLE HYPO 25X1 1.5 SAFETY (NEEDLE) ×3 IMPLANT
NS IRRIG 1000ML POUR BTL (IV SOLUTION) ×3 IMPLANT
PACK BASIN DAY SURGERY FS (CUSTOM PROCEDURE TRAY) ×3 IMPLANT
PAD CAST 3X4 CTTN HI CHSV (CAST SUPPLIES) IMPLANT
PADDING CAST ABS 4INX4YD NS (CAST SUPPLIES) ×2
PADDING CAST ABS COTTON 4X4 ST (CAST SUPPLIES) ×1 IMPLANT
PADDING CAST COTTON 3X4 STRL (CAST SUPPLIES)
SOLUTION PARTIC MCRMTRX 500MG (Tissue) IMPLANT
SPLINT FINGER 3.25 BULB 911905 (SOFTGOODS) ×2 IMPLANT
SPLINT PLASTER CAST XFAST 3X15 (CAST SUPPLIES) IMPLANT
SPLINT PLASTER XTRA FASTSET 3X (CAST SUPPLIES)
STOCKINETTE 4X48 STRL (DRAPES) ×3 IMPLANT
SUT ETHILON 4 0 PS 2 18 (SUTURE) IMPLANT
SWAB COLLECTION DEVICE MRSA (MISCELLANEOUS) IMPLANT
SYR BULB 3OZ (MISCELLANEOUS) ×3 IMPLANT
SYR CONTROL 10ML LL (SYRINGE) IMPLANT
TOWEL OR 17X24 6PK STRL BLUE (TOWEL DISPOSABLE) ×6 IMPLANT
TRAY DSU PREP LF (CUSTOM PROCEDURE TRAY) ×3 IMPLANT
TUBE ANAEROBIC SPECIMEN COL (MISCELLANEOUS) IMPLANT
TUBE FEEDING 5FR 15 INCH (TUBING) IMPLANT
UNDERPAD 30X30 (UNDERPADS AND DIAPERS) ×3 IMPLANT

## 2015-01-30 NOTE — Op Note (Signed)
[id number] 

## 2015-01-30 NOTE — Transfer of Care (Signed)
Immediate Anesthesia Transfer of Care Note  Patient: [name]  Procedure(s) Performed: Procedure(s): A CELL PLACEMENT, RIGHT LONG FINGER WITH DEBRIDEMENT OF WOUND  (Right)  Patient Location: PACU  Anesthesia Type:MAC  Level of Consciousness: awake, alert , oriented and patient cooperative  Airway & Oxygen Therapy: Patient Spontanous Breathing and Patient connected to face mask oxygen  Post-op Assessment: Report given to RN and Post -op Vital signs reviewed and unstable, Anesthesiologist notified  Post vital signs: Reviewed  Last Vitals:  Filed Vitals:   01/30/15 0945  BP: 97/64  Pulse: 52  Temp:   Resp: 12    Complications: No apparent anesthesia complications

## 2015-01-30 NOTE — Anesthesia Postprocedure Evaluation (Signed)
  Anesthesia Post-op Note  Patient: [name]  Procedure(s) Performed: Procedure(s): A CELL PLACEMENT, RIGHT LONG FINGER WITH DEBRIDEMENT OF WOUND  (Right)  Patient Location: PACU  Anesthesia Type: MAC  Level of Consciousness: awake and alert   Airway and Oxygen Therapy: Patient Spontanous Breathing  Post-op Pain: none  Post-op Assessment: Post-op Vital signs reviewed, Patient's Cardiovascular Status Stable and Respiratory Function Stable  Post-op Vital Signs: Reviewed  Filed Vitals:   01/30/15 1110  BP:   Pulse: 63  Temp:   Resp:     Complications: No apparent anesthesia complications

## 2015-01-30 NOTE — Discharge Instructions (Addendum)
Post Anesthesia Home Care Instructions  Activity: Get plenty of rest for the remainder of the day. A responsible adult should stay with you for 24 hours following the procedure.  For the next 24 hours, DO NOT: -Drive a car -Paediatric nurse -Drink alcoholic beverages -Take any medication unless instructed by your physician -Make any legal decisions or sign important papers.  Meals: Start with liquid foods such as gelatin or soup. Progress to regular foods as tolerated. Avoid greasy, spicy, heavy foods. If nausea and/or vomiting occur, drink only clear liquids until the nausea and/or vomiting subsides. Call your physician if vomiting continues.  Special Instructions/Symptoms: Your throat may feel dry or sore from the anesthesia or the breathing tube placed in your throat during surgery. If this causes discomfort, gargle with warm salt water. The discomfort should disappear within 24 hours.  If you had a scopolamine patch placed behind your ear for the management of post- operative nausea and/or vomiting:  1. The medication in the patch is effective for 72 hours, after which it should be removed.  Wrap patch in a tissue and discard in the trash. Wash hands thoroughly with soap and water. 2. You may remove the patch earlier than 72 hours if you experience unpleasant side effects which may include dry mouth, dizziness or visual disturbances. 3. Avoid touching the patch. Wash your hands with soap and water after contact with the patch.   [facility] Instructions Hand Surgery  Wound Care: Keep your hand elevated above the level of your heart.  Do not allow it to dangle by your side.  Keep the dressing dry and do not remove it unless your doctor advises you to do so.  He will usually change it at the time of your post-op visit.  Moving your fingers is advised to stimulate circulation but will depend on the site of your surgery.  If you have a splint applied, your doctor will advise you  regarding movement.  Activity: Do not drive or operate machinery today.  Rest today and then you may return to your normal activity and work as indicated by your physician.  Diet:  Drink liquids today or eat a light diet.  You may resume a regular diet tomorrow.    General expectations: Pain for two to three days. Fingers may become slightly swollen.  Call your doctor if any of the following occur: Severe pain not relieved by pain medication. Elevated temperature. Dressing soaked with blood. Inability to move fingers. White or bluish color to fingers.    Post Anesthesia Home Care Instructions  Activity: Get plenty of rest for the remainder of the day. A responsible adult should stay with you for 24 hours following the procedure.  For the next 24 hours, DO NOT: -Drive a car -Paediatric nurse -Drink alcoholic beverages -Take any medication unless instructed by your physician -Make any legal decisions or sign important papers.  Meals: Start with liquid foods such as gelatin or soup. Progress to regular foods as tolerated. Avoid greasy, spicy, heavy foods. If nausea and/or vomiting occur, drink only clear liquids until the nausea and/or vomiting subsides. Call your physician if vomiting continues.  Special Instructions/Symptoms: Your throat may feel dry or sore from the anesthesia or the breathing tube placed in your throat during surgery. If this causes discomfort, gargle with warm salt water. The discomfort should disappear within 24 hours.  If you had a scopolamine patch placed behind your ear for the management of post- operative nausea and/or vomiting:  1. The medication in the patch is effective for 72 hours, after which it should be removed.  Wrap patch in a tissue and discard in the trash. Wash hands thoroughly with soap and water. 2. You may remove the patch earlier than 72 hours if you experience unpleasant side effects which may include dry mouth, dizziness or visual  disturbances. 3. Avoid touching the patch. Wash your hands with soap and water after contact with the patch.         HAND SURGERY    HOME CARE INSTRUCTIONS    The following instructions have been prepared to help you care for yourself upon your return home today.  Wound Care:  Keep your hand elevated above the level of your heart. Do not allow it to dangle by your side. Keep the dressing dry and do not remove it unless your doctor advises you to do so. He will usually change it at the time of you post-op visit. Moving your fingers is advised to stimulate circulation but will depend on the site of your surgery. Of course, if you have a splint applied your doctor will advise you about movement.  Activity:  Do not drive or operate machinery today. Rest today and then you may return to your normal activity and work as indicated by your physician.  Diet: Drink liquids today or eat a light diet. You may resume a regular diet tomorrow.  General expectations: Pain for two or three days. Fingers may become slightly swollen.   Unexpected Observations- Call your doctor if any of these occur: Severe pain not relieved by pain medication. Elevated temperature. Dressing soaked with blood. Inability to move fingers. White or bluish color to fingers.

## 2015-01-30 NOTE — H&P (Signed)
[name] is an 59 y.o. female.   Chief Complaint: right long finger non healing wound. HPI: 59 yo female with non healing wounds right long finger.  Has tried local wound care without success.  Presents today for A cell treatment to wound.  Past Medical History  Diagnosis Date  . Diabetes mellitus, type 2   . Secondary hyperparathyroidism   . Ischemic cardiomyopathy     a. EF 25-30% 2011;  b.  Echocardiogram (09/01/2013): EF 30-35%, diffuse hypokinesis, MAC, mild MR, mild LAE, mild RVE, severely reduced RVSF, moderate RAE, mildly increased PASP.  [name] Pulmonary hypertension     Est PAsyst 60-Dr Linard Millers, off coumadin, CT neg for PE  . Hyperlipidemia   . Anemia   . Thyroid disease   . Pulmonary edema   . Peripheral vascular disease     a. Prior LE amputations. b. s/p prior LE bypass surgery.  . Sebaceous cyst   . 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 Richardson Dopp  . Shortness of breath     with exertion  . Hemodialysis patient     M,W,F  . TIA (transient ischemic attack) June 2014  . Ulcer of heel due to diabetes     left  . Kidney disease Dialysis MWF    ESRD secondary to DM, failed transplant 2006. Dialysis  . Atrial fibrillation     a. Dx 08/2013, placed on amiodarone.  . Chronic systolic CHF (congestive heart failure)   . CAD (coronary artery disease)     a. s/p CABG 2011.;  b. Lexiscan Myoview (02/2013): Intermediate risk study; EF 50%, basal inferolateral, basal anterolateral and mid inferolateral moderate ischemia; basal anterior, basal anteroseptal and mid anterior scar;  c. Type 2 NSTEMI during admission for LE revasc surgery 02/2013 in setting of SVT  . Polyneuropathy in diabetes(357.2) 10/31/2013  . Neuropathy   . OSA (obstructive sleep apnea)     uses c-papNPSG 11.23.11-AHI 64.9/hr-central and obstructive apnea  . Headache   . Carpal tunnel syndrome, right     Past Surgical History  Procedure Laterality Date  . Coronary artery bypass graft  2011    3V  .  Insertion of dialysis catheter    . Above knee leg amputation Left     infection/gangrene 10/11/07  . Nasal mucosal biopsy    . Ovarian cyst surgery    . 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 Laser retinal surgery  2011  . Kidney transplant      rejection  . Abdominal angiogram Left 01/25/13  . Colonoscopy    . Cardiac catheterization    . Femoral-popliteal bypass graft Left 02/20/2013    Procedure:  FEMORAL-to below knee POPLITEAL ARTERY bypasswith saphenous vein with intraoperative angiogram ;  Surgeon: Angelia Mould, MD;  Location: [place];  Service: Vascular;  Laterality: Left;  fem-below knee pop  . Amputation Left 03/27/2013    Procedure: AMPUTATION ABOVE KNEE;  Surgeon: Angelia Mould, MD;  Location: [place];  Service: Vascular;  Laterality: Left;  . Tee without cardioversion N/A 09/11/2013    Procedure: TRANSESOPHAGEAL ECHOCARDIOGRAM (TEE);  Surgeon: Casandra Doffing, MD;  Location: [facility] ENDOSCOPY;  Service: Cardiovascular;  Laterality: N/A;  . Cardioversion N/A 09/11/2013    Procedure: CARDIOVERSION;  Surgeon: Casandra Doffing, MD;  Location: [facility] ENDOSCOPY;  Service: Cardiovascular;  Laterality: N/A;  . Below knee leg amputation Right   . Revison of arteriovenous fistula Right 11/21/2013    Procedure: REVISON OF  ARTERIOVENOUS FISTULA;  Surgeon: Rosetta Posner, MD;  Location: [place];  Service: Vascular;  Laterality: Right;  . Cataract surgery Right 01-07-2014  . Lower extremity angiogram Left 01/25/2013    Procedure: LOWER EXTREMITY ANGIOGRAM;  Surgeon: Conrad Deer Park, MD;  Location: [facility] CATH LAB;  Service: Cardiovascular;  Laterality: Left;  . Colonoscopy with propofol N/A 12/12/2014    Procedure: COLONOSCOPY WITH PROPOFOL;  Surgeon: Laurence Spates, MD;  Location: [place];  Service: Endoscopy;  Laterality: N/A;  . Eye surgery    . Eye surgery Left November 28, 2014    Laser     Family History  Problem Relation Age of Onset  . Heart disease    . Edema  Father     fluid overload  . Diabetes Father   . Cerebral aneurysm Mother   . Diabetes Mother   . Stroke Brother   . Heart disease Brother   . Diabetes Brother   . Hyperlipidemia Brother   . Hypertension Brother   . Peripheral vascular disease Brother     amputation   Social History:  reports that she has never smoked. She has quit using smokeless tobacco. Her smokeless tobacco use included Snuff and Chew. She reports that she does not drink alcohol or use illicit drugs.  Allergies:  Allergies  Allergen Reactions  . Neurontin [Gabapentin] Anaphylaxis  . Ezetimibe-Simvastatin Itching and Swelling     facial edema  . Klonopin [Clonazepam] Other (See Comments)    hallucination  . Sertraline Hcl Other (See Comments)    hallucination    Medications Prior to Admission  Medication Sig Dispense Refill  . acetaminophen (TYLENOL) 325 MG tablet Take 650-975 mg by mouth 3 (three) times daily as needed (pain).     [name] amiodarone (PACERONE) 200 MG tablet Take 1 tablet (200 mg total) by mouth daily. (Patient taking differently: Take 100 mg by mouth daily. ) 30 tablet 11  . aspirin EC 81 MG tablet Take 81 mg by mouth daily.    [name] atorvastatin (LIPITOR) 10 MG tablet Take 10 mg by mouth daily at 2 PM daily at 2 PM.     . B Complex-C (SUPER B COMPLEX PO) Take 1 tablet by mouth daily at 2 PM daily at 2 PM.    . B Complex-C-Folic Acid (DIALYVITE TABLET) TABS Take 1 tablet by mouth daily at 2 PM daily at 2 PM.   3  . cinacalcet (SENSIPAR) 60 MG tablet Take 60 mg by mouth daily at 2 PM daily at 2 PM.     . hydrOXYzine (ATARAX/VISTARIL) 25 MG tablet Take 25 mg by mouth every 6 (six) hours as needed for itching.     . insulin aspart (NOVOLOG FLEXPEN) 100 UNIT/ML FlexPen Inject 2-15 Units into the skin 3 (three) times daily with meals. Per sliding scale    . midodrine (PROAMATINE) 10 MG tablet Take 10 mg by mouth daily at 2 PM daily at 2 PM.     . pregabalin (LYRICA) 25 MG capsule Take 25 mg by mouth daily  at 2 PM daily at 2 PM.     . Propylene Glycol 0.6 % SOLN Place 1 drop into the right eye 3 (three) times daily as needed (dry eyes).    . sevelamer carbonate (RENVELA) 800 MG tablet Take 1,600 mg by mouth daily with lunch.     . warfarin (COUMADIN) 4 MG tablet 4 mg. Take One and a half tablet Mon-Wed-Frid  And  Take one tablet Sun-Tue-Thur-  Sat.      Results for orders placed or performed during the hospital encounter of 01/30/15 (from the past 48 hour(s))  Basic metabolic panel     Status: Abnormal   Collection Time: 01/29/15 12:10 PM  Result Value Ref Range   Sodium 134 (L) 135 - 145 mmol/L   Potassium 3.7 3.5 - 5.1 mmol/L   Chloride 98 (L) 101 - 111 mmol/L   CO2 30 22 - 32 mmol/L   Glucose, Bld 97 65 - 99 mg/dL   BUN 9 6 - 20 mg/dL   Creatinine, Ser 2.17 (H) 0.44 - 1.00 mg/dL   Calcium 8.0 (L) 8.9 - 10.3 mg/dL   GFR calc non Af Amer 24 (L) >60 mL/min   GFR calc Af Amer 27 (L) >60 mL/min    Comment: (NOTE) The eGFR has been calculated using the CKD EPI equation. This calculation has not been validated in all clinical situations. eGFR's persistently <60 mL/min signify possible Chronic Kidney Disease.    Anion gap 6 5 - 15  PT/INR at PAT visit (Pre-admission Testing)     Status: Abnormal   Collection Time: 01/29/15 12:10 PM  Result Value Ref Range   Prothrombin Time 22.9 (H) 11.6 - 15.2 seconds   INR 2.01 (H) 0.00 - 1.49  Glucose, capillary     Status: None   Collection Time: 01/30/15  8:10 AM  Result Value Ref Range   Glucose-Capillary 89 65 - 99 mg/dL    No results found.   A comprehensive review of systems was negative except for: Constitutional: positive for anorexia Eyes: positive for contacts/glasses  Blood pressure 111/63, pulse 81, temperature 98.1 F (36.7 C), temperature source Oral, resp. rate 20, height 4' 8"  (1.422 m), weight 83.915 kg (185 lb), SpO2 94 %.  General appearance: alert, cooperative and appears stated age Head: Normocephalic, without obvious  abnormality, atraumatic Neck: supple, symmetrical, trachea midline Resp: clear to auscultation bilaterally Cardio: regular rate and rhythm GI: non tender Extremities: intact sensation and capillary refill all digits.   Pulses: 2+ and symmetric Skin: Skin color, texture, turgor normal. No rashes or lesions Neurologic: Grossly normal Incision/Wound: Wound dorsum right long finger without erythema.  Assessment/Plan Right long finger non healing wound.  Plan A Cell application and debridement.  Non operative and operative treatment options were discussed with the patient and patient wishes to proceed with operative treatment. Risks, benefits, and alternatives of surgery were discussed and the patient agrees with the plan of care.   Rajean Desantiago R 01/30/2015, 8:23 AM

## 2015-01-30 NOTE — Anesthesia Preprocedure Evaluation (Addendum)
Anesthesia Evaluation  Patient identified by MRN, date of birth, ID band Patient awake    Reviewed: Allergy & Precautions, H&P , NPO status , Patient's Chart, lab work & pertinent test results  Airway Mallampati: I  TM Distance: >3 FB Neck ROM: Full    Dental no notable dental hx. (+) Upper Dentures, Edentulous Lower   Pulmonary sleep apnea and Continuous Positive Airway Pressure Ventilation ,  breath sounds clear to auscultation  Pulmonary exam normal       Cardiovascular hypertension, Pt. on medications + CAD, + CABG, + Peripheral Vascular Disease and +CHF + dysrhythmias Atrial Fibrillation Rhythm:Irregular Rate:Normal     Neuro/Psych  Headaches, TIAnegative psych ROS   GI/Hepatic negative GI ROS, Neg liver ROS,   Endo/Other  diabetes, Type 1, Insulin Dependent  Renal/GU ESRF and DialysisRenal diseasenegative Renal ROS  negative genitourinary   Musculoskeletal   Abdominal   Peds  Hematology negative hematology ROS (+)   Anesthesia Other Findings   Reproductive/Obstetrics negative OB ROS                           Anesthesia Physical Anesthesia Plan  ASA: III  Anesthesia Plan: MAC   Post-op Pain Management:    Induction: Intravenous  Airway Management Planned: Simple Face Mask  Additional Equipment:   Intra-op Plan:   Post-operative Plan:   Informed Consent: I have reviewed the patients History and Physical, chart, labs and discussed the procedure including the risks, benefits and alternatives for the proposed anesthesia with the patient or authorized representative who has indicated his/her understanding and acceptance.   Dental advisory given  Plan Discussed with: CRNA and Surgeon  Anesthesia Plan Comments:       Anesthesia Quick Evaluation

## 2015-01-30 NOTE — Anesthesia Procedure Notes (Signed)
Procedure Name: MAC Date/Time: 01/30/2015 8:40 AM Performed by: Maisee Vollman D Pre-anesthesia Checklist: Patient identified, Emergency Drugs available, Suction available, Patient being monitored and Timeout performed Patient Re-evaluated:Patient Re-evaluated prior to inductionOxygen Delivery Method: Simple face mask

## 2015-01-30 NOTE — Brief Op Note (Signed)
01/30/2015  9:30 AM  PATIENT:  [name]  59 y.o. female  PRE-OPERATIVE DIAGNOSIS:  RIGHT LONG FINGER NON-HEALING WOUND   POST-OPERATIVE DIAGNOSIS:  RIGHT LONG FINGER NON-HEALING WOUND   PROCEDURE:  Procedure(s): A CELL PLACEMENT, RIGHT LONG FINGER WITH DEBRIDEMENT OF WOUND  (Right)  SURGEON:  Surgeon(s) and Role:    * Leanora Cover, MD - Primary  PHYSICIAN ASSISTANT:   ASSISTANTS: none   ANESTHESIA:   local  EBL:  Total I/O In: 300 [I.V.:300] Out: -   BLOOD ADMINISTERED:none  DRAINS: none   LOCAL MEDICATIONS USED:  MARCAINE    and LIDOCAINE   SPECIMEN:  No Specimen  DISPOSITION OF SPECIMEN:  N/A  COUNTS:  YES  TOURNIQUET:   Total Tourniquet Time Documented: area (laterality) - 12 minutes Total: area (laterality) - 12 minutes   DICTATION: .Other Dictation: Dictation Number [id number]  PLAN OF CARE: Discharge to home after PACU  PATIENT DISPOSITION:  PACU - hemodynamically stable.

## 2015-01-31 ENCOUNTER — Ambulatory Visit (INDEPENDENT_AMBULATORY_CARE_PROVIDER_SITE_OTHER): Payer: Medicare Other | Admitting: *Deleted

## 2015-01-31 ENCOUNTER — Encounter (HOSPITAL_BASED_OUTPATIENT_CLINIC_OR_DEPARTMENT_OTHER): Payer: Self-pay | Admitting: Orthopedic Surgery

## 2015-01-31 DIAGNOSIS — Z5181 Encounter for therapeutic drug level monitoring: Secondary | ICD-10-CM

## 2015-01-31 DIAGNOSIS — I4891 Unspecified atrial fibrillation: Secondary | ICD-10-CM | POA: Diagnosis not present

## 2015-01-31 DIAGNOSIS — I639 Cerebral infarction, unspecified: Secondary | ICD-10-CM | POA: Diagnosis not present

## 2015-01-31 DIAGNOSIS — Z7901 Long term (current) use of anticoagulants: Secondary | ICD-10-CM

## 2015-01-31 LAB — POCT INR: INR: 1.6

## 2015-02-01 NOTE — Op Note (Signed)
NAMEMOOREA, BOISSONNEAULT NO.:  [id number]  MEDICAL RECORD NO.:  [id number]  LOCATION:                                FACILITY:  [facility]  PHYSICIAN:  Leanora Cover, MD        DATE OF BIRTH:  [birth year]  DATE OF PROCEDURE:  01/30/2015 DATE OF DISCHARGE:  01/30/2015                              OPERATIVE REPORT   PREOPERATIVE DIAGNOSIS:  Right long finger nonhealing wound.  POSTOPERATIVE DIAGNOSIS:  Right long finger nonhealing wound, approximately 1.2 cm.  PROCEDURE:  ACell placement, right long finger, 1.2 cm wound.  SURGEON:  Leanora Cover, MD  ASSISTANT:  None.  ANESTHESIA:  Digital block with sedation.  IV FLUIDS:  Per anesthesia flow sheet.  ESTIMATED BLOOD LOSS:  Minimal.  COMPLICATIONS:  None.  SPECIMENS:  None.  TOURNIQUET TIME:  12 minutes.  DISPOSITION:  Stable to PACU.  INDICATIONS:  Ms. Milos is a 59 year old female with end-stage renal failure who has had a nonhealing wound on the right long finger.  She has tried local wound care for this without successful resolution.  She wishes to undergo ACell treatment.  Risks, benefits, and alternatives of procedure were discussed including risk of blood loss; infection; damage to nerves, vessels, tendons, ligaments, bone; failure of surgery; need for additional surgery; complications with wound healing; continued pain; continued wound and need for further procedures.  She voiced understanding of these risks and elected to proceed.  OPERATIVE COURSE:  After being identified preoperatively by myself, the patient and I agreed upon the procedure and site of procedure.  Surgical site was marked.  The risks, benefits, and alternatives of the surgery were reviewed and she wished to proceed.  Surgical consent had been signed.  She was transferred to the operating room and placed on the operating room table in supine position with the right upper extremity on an armboard.  She was given IV antibiotics as  preoperative antibiotic prophylaxis.  A surgical pause was performed between the surgeons, anesthesia, and operating room staff, and all were in agreement as to the patient, procedure and site of procedure.  Digital block was performed, and 10 mL of half and half solution of 1% plain lidocaine and 0.25% plain Marcaine.  This was adequate to give digital anesthesia of the finger.  Right hand was prepped and draped in normal sterile orthopedic fashion.  A surgical pause was again performed between the surgeons, anesthesia, and operating room staff, and all were in agreement as to the patient, procedure, and site of procedure.  A Penrose drain was used as a tourniquet and was up for 12 minutes.  The wound at the dorsal radial aspect of the long finger at the base of the nail was debrided.  Any devitalized tissue was removed.  The skin edges were freshened sharply with the knife.  The wound bed was taken back to a punctate bleeding bed.  The ACell powder was then placed into the defect.  The area was dressed with a sterile Adaptic, Surgilube dressing, a 4 x 4 and wrapped with a Coban dressing lightly.  An Alumafoam splint was placed and wrapped lightly with a Coban dressing. Penrose  drain was removed at 12 minutes.  Fingertips were all pink with brisk capillary refill after completion of procedure.  The operative drapes were broken down and the patient was awakened from anesthesia safely.  She was transferred back to stretcher and taken to PACU in stable condition.  I will see her back in the office next week for postoperative followup.  In 2-3 days, she will take the dressing down and reapply the powder.  We will give her Norco 5/325, 1-2 p.o. q.6 hours p.r.n. pain, dispensed #30.     Leanora Cover, MD     KK/MEDQ  D:  01/30/2015  T:  01/31/2015  Job:  [id number]

## 2015-02-04 ENCOUNTER — Encounter: Payer: Self-pay | Admitting: Physical Therapy

## 2015-02-04 ENCOUNTER — Ambulatory Visit: Payer: Medicare Other | Attending: Nephrology | Admitting: Physical Therapy

## 2015-02-04 DIAGNOSIS — Z89511 Acquired absence of right leg below knee: Secondary | ICD-10-CM | POA: Insufficient documentation

## 2015-02-04 DIAGNOSIS — R6889 Other general symptoms and signs: Secondary | ICD-10-CM | POA: Diagnosis present

## 2015-02-04 DIAGNOSIS — Z89612 Acquired absence of left leg above knee: Secondary | ICD-10-CM | POA: Insufficient documentation

## 2015-02-04 NOTE — Therapy (Addendum)
[facility] [address] [place], [place], [address] Phone: [phone]   Fax:  [phone]  Physical Therapy Evaluation  Patient Details  Name: [name] MRN: [id number] Date of Birth: [birth year] Referring Provider:  Mauricia Area, MD  Encounter Date: 02/04/2015      PT End of Session - 02/04/15 1230    Visit Number 1   Number of Visits 1   PT Start Time 1230   PT Stop Time 1340   PT Time Calculation (min) 70 min   Equipment Utilized During Treatment Gait belt   Activity Tolerance Patient tolerated treatment well   Behavior During Therapy [facility] for tasks assessed/performed      Past Medical History  Diagnosis Date  . Diabetes mellitus, type 2   . Secondary hyperparathyroidism   . Ischemic cardiomyopathy     a. EF 25-30% 2011;  b.  Echocardiogram (09/01/2013): EF 30-35%, diffuse hypokinesis, MAC, mild MR, mild LAE, mild RVE, severely reduced RVSF, moderate RAE, mildly increased PASP.  [name] Pulmonary hypertension     Est PAsyst 60-Dr Linard Millers, off coumadin, CT neg for PE  . Hyperlipidemia   . Anemia   . Thyroid disease   . Pulmonary edema   . Peripheral vascular disease     a. Prior LE amputations. b. s/p prior LE bypass surgery.  . Sebaceous cyst   . 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 Richardson Dopp  . Shortness of breath     with exertion  . Hemodialysis patient     M,W,F  . TIA (transient ischemic attack) June 2014  . Ulcer of heel due to diabetes     left  . Kidney disease Dialysis MWF    ESRD secondary to DM, failed transplant 2006. Dialysis  . Atrial fibrillation     a. Dx 08/2013, placed on amiodarone.  . Chronic systolic CHF (congestive heart failure)   . CAD (coronary artery disease)     a. s/p CABG 2011.;  b. Lexiscan Myoview (02/2013): Intermediate risk study; EF 50%, basal inferolateral, basal anterolateral and mid inferolateral moderate ischemia; basal anterior, basal anteroseptal and  mid anterior scar;  c. Type 2 NSTEMI during admission for LE revasc surgery 02/2013 in setting of SVT  . Polyneuropathy in diabetes(357.2) 10/31/2013  . Neuropathy   . OSA (obstructive sleep apnea)     uses c-papNPSG 11.23.11-AHI 64.9/hr-central and obstructive apnea  . Headache   . Carpal tunnel syndrome, right     Past Surgical History  Procedure Laterality Date  . Coronary artery bypass graft  2011    3V  . Insertion of dialysis catheter    . Above knee leg amputation Left     infection/gangrene 10/11/07  . Nasal mucosal biopsy    . Ovarian cyst surgery    . 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 Laser retinal surgery  2011  . Kidney transplant      rejection  . Abdominal angiogram Left 01/25/13  . Colonoscopy    . Cardiac catheterization    . Femoral-popliteal bypass graft Left 02/20/2013    Procedure:  FEMORAL-to below knee POPLITEAL ARTERY bypasswith saphenous vein with intraoperative angiogram ;  Surgeon: Angelia Mould, MD;  Location: [facility];  Service: Vascular;  Laterality: Left;  fem-below knee pop  . Amputation Left 03/27/2013    Procedure: AMPUTATION ABOVE KNEE;  Surgeon: Angelia Mould, MD;  Location: [facility];  Service: Vascular;  Laterality: Left;  .  Tee without cardioversion N/A 09/11/2013    Procedure: TRANSESOPHAGEAL ECHOCARDIOGRAM (TEE);  Surgeon: Casandra Doffing, MD;  Location: [facility] ENDOSCOPY;  Service: Cardiovascular;  Laterality: N/A;  . Cardioversion N/A 09/11/2013    Procedure: CARDIOVERSION;  Surgeon: Casandra Doffing, MD;  Location: [facility]COPY;  Service: Cardiovascular;  Laterality: N/A;  . Below knee leg amputation Right   . Revison of arteriovenous fistula Right 11/21/2013    Procedure: REVISON OF ARTERIOVENOUS FISTULA;  Surgeon: Rosetta Posner, MD;  Location: [facility];  Service: Vascular;  Laterality: Right;  . Cataract surgery Right 01-07-2014  . Lower extremity angiogram Left 01/25/2013    Procedure: LOWER EXTREMITY ANGIOGRAM;   Surgeon: Conrad Shasta, MD;  Location: [facility] CATH LAB;  Service: Cardiovascular;  Laterality: Left;  . Colonoscopy with propofol N/A 12/12/2014    Procedure: COLONOSCOPY WITH PROPOFOL;  Surgeon: Laurence Spates, MD;  Location: [facility];  Service: Endoscopy;  Laterality: N/A;  . Eye surgery    . Eye surgery Left November 28, 2014    Laser   . I&d extremity Right 01/30/2015    Procedure: A CELL PLACEMENT, RIGHT LONG FINGER WITH DEBRIDEMENT OF WOUND ;  Surgeon: Leanora Cover, MD;  Location: [facility];  Service: Orthopedics;  Laterality: Right;    There were no vitals filed for this visit.  Visit Diagnosis:  Decreased functional activity tolerance      Subjective Assessment - 02/04/15 1236    Subjective (p) Patient presents for power w/c evaluation.   Currently in Pain? (p) Yes   Pain Score (p) 3    Pain Location (p) Leg   Pain Orientation (p) Left   Pain Descriptors / Indicators (p) Aching;Burning   Pain Type (p) Neuropathic pain   Pain Onset (p) More than a month ago   Pain Frequency (p) Constant   Aggravating Factors  (p) transfers, sleeping wrong   Pain Relieving Factors (p) tylenol   Multiple Pain Sites (p) No       Mobility/Seating Evaluation    PATIENT INFORMATION: Name: Candace Frankland. Rocha DOB: 01-May-1956  Sex: Female Date seen: 02/04/2015 Time: 12:30  Address:  [address], [place], [place] [address] Physician: Dr. Jeneen Rinks Deterding This evaluation/justification form will serve as the LMN for the following suppliers: __________________________ Supplier: [employer] Contact Person: Luz Brazen, Wess Botts Phone:  [phone]   Seating Therapist: Jamey Reas, PT, DPT Phone:   [phone]   Phone: [phone] (Mobile)    Spouse/Parent/Caregiver name: ?????  Phone number: ????? Insurance/Payer: Medicare     Reason for Referral: new power w/c  Patient/Caregiver Goals: Patient received a donated power w/c from a friend ~3 yrs ago. It is not working  correctly and wants a new power w/c to enable mobility in her home.  Patient was seen for face-to-face evaluation for replacement power wheelchair.  Also present was patient only to discuss recommendations and wheelchair options.  Further paperwork was completed and sent to vendor.  Patient appears to qualify for power mobility device at this time per objective findings.   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, Left AKA 03/27/2013   Height: 5'9" when had 2 legs Weight: 181# Explain recent changes or trends in weight: no changes   History including Falls: Fallen 3  times during transfers, no injuries    HOME ENVIRONMENT: _0 House  _1 Condo/town home  _2 Apartment  _3 Assisted Living    _4 Lives Alone _5  Lives with Others                                                                                          Hours with caregiver: none  _6 Home is accessible to patient           Stairs      _7 Yes _8  No     Ramp _9 Yes _10 No Comments:  handicap accessible apt   COMMUNITY ADL: TRANSPORTATION: _11 Car    _12 Van    <TSVXBLTJQZESPQZR>_0<\/QTMAUQJFHLKTGYBW>_38 Public Transportation    _14 Adapted w/c Lift    _15 Ambulance    _16 Other:       _17 Sits in wheelchair during transport  Employment/School: ????? Specific requirements pertaining to mobility ?????  Other: ?????    FUNCTIONAL/SENSORY PROCESSING SKILLS:  Handedness:   _18 Right     _19 Left    _20 NA  Comments:  ?????  Functional Processing Skills for Wheeled Mobility _21 Processing Skills are adequate for safe wheelchair operation  Areas of concern than may interfere with safe operation of wheelchair Description of problem   _22  Attention to environment      _23 Judgment      _24  Hearing  _25  Vision or visual processing       _26 Motor Planning  _27  Fluctuations in Behavior  ?????    VERBAL COMMUNICATION: _28 WFL receptive _29  WFL expressive _30 Understandable  _31 Difficult to understand  _32 non-communicative _33  Uses an augmented communication device  CURRENT SEATING / MOBILITY: Current Mobility Base:  _34 None _35 Dependent _36 Manual _37 Scooter _38 Power  Type of Control: joystick,   Manufacturer:  spirit active careSize:  18" X 16"Age: she got it used ~ 3 yrs ago  Current Condition of Mobility Base:  sling / hammock to seat, joystick is broken, inconsistent with electronic controls, left motor is 70% dead per attendant   Current Wheelchair components:  portable (folds for easaier  transport), flip away foot rests.  Describe posture in present seating system:  leaning to right, sacral sitting, head forward,      SENSATION and SKIN ISSUES: Sensation _39 Intact  _40 Impaired _41 Absent  Level of sensation: buttocks in numb Pressure Relief: Able to perform effective pressure relief :    _42 Yes  _43  No Method: slides what she is able If not, Why?: UE strength not sufficient to boost  Skin Issues/Skin Integrity Current Skin Issues  _44 Yes _45 No _46 Intact _47  Red area_48  Open Area  _49 Scar Tissue _50 At risk from prolonged sitting Where  coccyx, bil. gluteal folds  History of Skin Issues  _51 Yes _52 No Where  right residual limb blisters prohibiting wear of prosthesis When  current, started Fall 2014  Hx of skin flap surgeries  _53 Yes _54 No Where  ????? When  ?????  Limited sitting tolerance _55 Yes _56 No Hours spent sitting in wheelchair daily: 6-8 hrs at time,  2x/day gets on her side to relief buttock pressure,  Complaint of Pain:  Please describe: left residual limb neuropathic pain, 3/10 currently, (in last week worst 8/10, best 2/10) aching / burning   Swelling/Edema: right limb when hangs  down   ADL STATUS (in reference to wheelchair use):  Indep Assist Unable Indep with Equip Not assessed Comments  Dressing X ????? ?????  X ????? Sitting in bed for lower body & w/c for upper body  Eating X ????? ????? ????? ????? ?????  Toileting X ????? ????? X ????? does not urinate with kidney failure, uses grabbar to transfer to toilet for BM & faces backwards  Bathing X ????? ????? X ????? uses shower chair & hand held shower, grab bars  Grooming/Hygiene X ????? ????? X ????? sits in w/c  Meal Prep X ????? ????? X ????? cooks from w/c  IADLS X ????? ????? ????? ????? ?????  Bowel Management: _0 Continent  _1 Incontinent  _2 Accidents Comments:  uses bowel program to manage timing  Bladder Management: _3 Continent  _4 Incontinent  _5 Accidents Comments:  does not make urine with kidney failure,      WHEELCHAIR SKILLS: Manual w/c Propulsion: _6 UE or LE strength and endurance sufficient to participate in ADLs using manual wheelchair Arm : _7 left _8 right   _9 Both      Distance: ????? Foot:  _10 left _11 right   _12 Both  Operate Scooter: _13  Strength, hand grip, balance and transfer appropriate for use _14 Living environment is accessible for use of scooter  Operate Power w/c:  _15  Std. Joystick   _16  Alternative Controls Indep _17  Assist _18  Dependent/unable _19  N/A _20   _21 Safe          _22  Functional      Distance: ?????  Bed confined without wheelchair _23  Yes _24  No   STRENGTH/RANGE OF MOTION:  Passive Range of Motion Strength  Shoulder WFL 3-/5 flexion & abduction bil.   Elbow WFL 3/5 flexion & extension bil.  Wrist/Hand WFL left grip 5#, right dominant UE 4#  Hip WFL 3-/5  Knee right WFL, Left NA 3-/5 right  Ankle NA NA     MOBILITY/BALANCE:  _25  Patient is totally dependent for mobility  ?????    Balance Transfers Ambulation  Sitting Balance: Standing Balance: _26  Independent _27  Independent/Modified Independent  _28  WFL     _29  WFL _30  Supervision _31  Supervision  _32  Uses UE for balance  _33  Supervision _34  Min Assist _35  Ambulates with Assist  ?????    _36  Min Assist _37  Min assist _38  Mod Assist _39  Ambulates with Device:       _40  RW  _41  StW  _42  Cane  _43  ?????  _44  Mod Assist _45  Mod assist _46  Max assist   _47  Max Assist _48  Max assist _49  Dependent _50  Indep. Short Distance Only  _51  Unable _52  Unable _53  Lift / Sling Required Distance (in feet)  ?????   _54  Sliding board _55  Unable to Ambulate (see explanation below)  Cardio Status:  _56 Intact  _57  Impaired   _58  NA     SOB / dyspnea with exertion, cardiac history  Respiratory Status:  _59 Intact   _60 Impaired   _61 NA     ?????  Orthotics/Prosthetics: BKA prosthesis but cannot wear currently due to blisters, AKA prosthesis but unable to use by itself so cannot use without Right LE prosthesis  Comments (Address manual vs power w/c vs scooter): UE strength not sufficient to propel manual w/c, cardiopulmonary status is impaired.         Anterior / Posterior Obliquity Rotation-Pelvis ?????  PELVIS    _62  _63  _64   Neutral Posterior Anterior  _65  _66  _67   WFL Rt elev Lt elev  _68  _69  _70   [facility] Right Left  Anterior    Anterior     _0  Fixed _1  Other _2  Partly Flexible _3  Flexible   _4  Fixed _5  Other _6  Partly Flexible  _7  Flexible  _8  Fixed _9  Other _10  Partly Flexible  _11  Flexible   TRUNK  _12  _13  _14   WFL ? Thoracic ? Lumbar  Kyphosis Lordosis  _15  _16  _17   WFL Convex Convex  Right Left _18 c-curve _19 s-curve _20 multiple  _21  Neutral _22  Left-anterior _23  Right-anterior     _24  Fixed _25  Flexible _26  Partly Flexible _27  Other  _28  Fixed _29  Flexible _30  Partly Flexible _31  Other  _32  Fixed             _33  Flexible _34  Partly Flexible _35  Other    Position Windswept  ?????  HIPS          _36            _37               _38    Neutral       Abduct        ADduct         _39           _40            _41   Neutral Right           Left      _42  Fixed _43  Subluxed _44  Partly Flexible _45  Dislocated _46  Flexible  _47  Fixed _48  Other _49  Partly Flexible  _50  Flexible                 Foot Positioning Knee Positioning  ?????    _51  WFL  _52 Lt _53 Rt _54  WFL  _55 Lt _56 Rt    KNEES ROM  concerns: ROM concerns:    & Dorsi-Flexed _57 Lt _58 Rt ?????    FEET Plantar Flexed _59 Lt _60 Rt      Inversion                 _61 Lt _62 Rt      Eversion                 _63 Lt _64 Rt     HEAD _65  Functional _66  Good Head Control  ?????  & _67  Flexed         _68  Extended _69  Adequate Head Control    NECK _70  Rotated  Lt  _71  Lat Flexed Lt _72  Rotated  Rt _73  Lat Flexed Rt _74  Limited Head Control     _75  Cervical Hyperextension _76  Absent  Head Control     SHOULDERS ELBOWS WRIST& HAND ?????      Left     Right    Left     Right    Left     Right   U/E _77 Functional           _78 Functional ????? ????? _79 Fisting             _80 Fisting      _81 elev   _82 dep      _83 elev   _84 dep       _85 pro -_86 retract     _87 pro  _88 retract _89 subluxed             _90 subluxed           Goals for Wheelchair Mobility  _91  Independence with mobility in the home with motor related ADLs (MRADLs)  _92  Independence with MRADLs in the community _93  Provide dependent mobility  _94  Provide recline     _95 Provide tilt   Goals  for Seating system _0  Optimize pressure distribution _1  Provide support needed to facilitate function or safety _2  Provide corrective forces to assist with maintaining or improving posture _3  Accommodate client's posture:   current seated postures and positions are not flexible or will not tolerate corrective forces _4  Client to be independent with relieving pressure in the wheelchair _5 Enhance physiological function such as breathing, swallowing, digestion  Simulation ideas/Equipment trials:????? State why other equipment was unsuccessful:?????   MOBILITY BASE RECOMMENDATIONS and JUSTIFICATION: MOBILITY COMPONENT JUSTIFICATION  Manufacturer: QuantumModel: Q6 edge 2.0   Size: Width 18"Seat Depth 20" _6 provide transport from point A to B      _7 promote Indep mobility  _8 is not a safe, functional ambulator _9 walker or cane inadequate _10 non-standard width/depth necessary to accommodate anatomical  measurement _11  ?????  _12 Manual Mobility Base _13 non-functional ambulator    _14 Scooter/POV  _15 can safely operate  _16 can safely transfer   _17 has adequate trunk stability  _18 cannot functionally propel manual w/c  _19 Power Mobility Base  _20 non-ambulatory  _21 cannot functionally propel manual wheelchair  _22  cannot functionally and safely operate scooter/POV _23 can safely operate and willing to  _24 Stroller Base _25 infant/child  _26 unable to propel manual wheelchair _27 allows for growth _28 non-functional ambulator _29 non-functional UE _30 Indep mobility is not a goal at this time  _31 Tilt  _32 Forward _33 Backward _34 Powered tilt  _35 Manual tilt  _36 change position against gravitational force on head and shoulders  _37 change position for pressure relief/cannot weight shift _38 transfers  _39 management of tone _40 rest periods _41 control edema _42 facilitate postural control  _43  ?????  _44 Recline  _45 Power recline on power base _46 Manual recline on manual base  _47 accommodate femur to back angle  _48 bring to full recline for ADL care  _49 change position for pressure relief/cannot weight shift _50 rest periods _51 repositioning for transfers or clothing/diaper /catheter changes _52 head positioning  _53 Lighter weight required _54 self- propulsion  _55 lifting _56  ?????  _57 Heavy Duty required _58 user weight greater than 250# _59 extreme tone/ over active movement _60 broken frame on previous chair _61  ?????  _62  Back  _63  Angle Adjustable _64  Custom molded True Comfort _65 postural control _66 control of tone/spasticity _67 accommodation of range of motion _68 UE functional control _69 accommodation for seating system _70  ????? _71 provide lateral trunk support _72 accommodate deformity _73 provide posterior trunk support _74 provide lumbar/sacral support _75 support trunk in midline _76 Pressure relief over spinal processes  _77  Seat Cushion Roho _78 impaired sensation  _79 decubitus ulcers present _80 history of pressure  ulceration _81 prevent pelvic extension _82 low maintenance  _83 stabilize pelvis  _84 accommodate obliquity _85 accommodate multiple deformity _86 neutralize lower extremity position _87 increase pressure distribution _88  ?????  _89  Pelvic/thigh support  _90  Lateral thigh guide _91  Distal medial pad  _92  Distal lateral pad _93  pelvis in neutral _94 accommodate pelvis _95  position upper legs _96  alignment _97  accommodate ROM _98  decr adduction _99 accommodate tone _100 removable for transfers _101 decr abduction  _102  Lateral trunk Supports _103  Lt     _104  Rt _105 decrease lateral trunk leaning _106 control tone _107 contour for increased contact _108 safety  _109 accommodate asymmetry _110  ?????  _111  Mounting hardware  _112 lateral trunk supports  _113 back   _114 seat _115 headrest      _116  thigh support _117 fixed   _118 swing away _119 attach seat platform/cushion to w/c frame _120 attach back cushion to w/c frame _121 mount postural supports _122 mount headrest  _123 swing medial thigh support away _124 swing lateral supports away for transfers  _125  ?????    Armrests  _126 fixed _127 adjustable height _128 removable   _129 swing away  _130 flip back   _131 reclining _132 full length pads _133 desk    _134 pads tubular  _135 provide support with elbow at 90   _136 provide support for  w/c tray _0 change of height/angles for variable activities _1 remove for transfers _2 allow to come closer to table top _3 remove for access to tables _4  ?????  Hangers/ Leg rests  _5 60 _6 70 _7 90 _8 elevating _9 heavy duty  _10 articulating _11 fixed _12 lift off _13 swing away     _14 power _15 provide LE support  _16 accommodate to hamstring tightness _17 elevate legs during recline   _18 provide change in position for Legs _19 Maintain placement of feet on footplate _20 durability _21 enable transfers _22 decrease edema _23 Accommodate lower leg length _24  ?????  Foot support Footplate    <TFTDDUKGURKYHCWC>_3<\/JSEGBTDVVOHYWVPX>_10 Lt  _26  Rt  _27  Center mount _28 flip up     _29 depth/angle adjustable _30 Amputee adapter    _31  Lt     _32  Rt _33 provide foot  support _34 accommodate to ankle ROM _35 transfers _36 Provide support for residual extremity _37  allow foot to go under wheelchair base _38  decrease tone  _39  ?????  _40  Ankle strap/heel loops _41 support foot on foot support _42 decrease extraneous movement _43 provide input to heel  _44 protect foot  Tires: _45 pneumatic  _46 flat free inserts  _47 solid  _48 decrease maintenance  _49 prevent frequent flats _50 increase shock absorbency _51 decrease pain from road shock _52 decrease spasms from road shock _53  ?????  _54  Headrest  _55 provide posterior head support _56 provide posterior neck support _57 provide lateral head support _58 provide anterior head support _59 support during tilt and recline _60 improve feeding   _61 improve respiration _62 placement of switches _63 safety  _64 accommodate ROM  _65 accommodate tone _66 improve visual orientation  _67  Anterior chest strap _68  Vest _69  Shoulder retractors  _70 decrease forward movement of shoulder _71 accommodation of TLSO _72 decrease forward movement of trunk _73 decrease shoulder elevation _74 added abdominal support _75 alignment _76 assistance with shoulder control  _77  ?????  Pelvic Positioner _78 Belt _79 SubASIS bar _80 Dual Pull _81 stabilize tone _82 decrease falling out of chair/ **will not Decr potential for sliding due to pelvic tilting _83 prevent excessive rotation _84 pad for protection over boney prominence _85 prominence comfort _86 special pull angle to control rotation _87  ?????  Upper Extremity Support _88 L   _89  R _90 Arm trough    _91 hand support _92  tray       _93 full tray _94 swivel mount _95 decrease edema      _96 decrease subluxation   _97 control tone   _98 placement for AAC/Computer/EADL _99 decrease gravitational pull on shoulders _100 provide midline positioning _101 provide support to increase UE function _102 provide hand support in natural position _103 provide work surface   POWER WHEELCHAIR CONTROLS  _104 Proportional  _105 Non-Proportional Type joy stick _106 Left  _107 Right  _108 provides access for controlling wheelchair   _109 lacks motor control to operate proportional drive control <GYIRSWNIOEVOJJKK>_9<\/FGHWEXHBZJIRCVEL>_381 unable to understand proportional controls  Actuator Control Module  _111 Single  _112 Multiple   _113 Allow the client to operate the power seat function(s) through the joystick control   _114 Safety Reset Switches _115 Used to change modes and stop the wheelchair when driving in latch mode    _116 [employer]   _117 programming for accurate control _118 progressive Disease/changing condition _119 non-proportional drive control needed _120 Needed in order to operate power seat functions through joystick control   _121 Display box _122 Allows user to see in which mode and drive the wheelchair is set  _123 necessary for alternate controls    _124 Digital interface electronics _125 Allows w/c to operate when using alternative drive controls  <OFBPZWCHENIDPOEU>_2<\/PNTIRWERXVQMGQQP>_619 ASL Head Array _127 Allows client to operate wheelchair  through switches placed in tri-panel headrest  _128 Sip and puff with tubing kit _129 needed to operate sip and puff drive controls  <JKDTOIZTIWPYKDXI>_3<\/JASNKNLZJQBHALPF>_790 Upgraded tracking electronics _131 increase safety when driving <WIOXBDZHGDJMEQAS>_3<\/MHDQQIWLNLGXQJJH>_417 correct tracking when on uneven surfaces  _133 [facility] for switches or joystick _134 Attaches switches to w/c  _135 Swing away for access or transfers _136   midline for optimal placement _0 provides for consistent access  _1 Attendant controlled joystick plus mount _2 safety _3 long distance driving <LZJQBHALPFXTKWIO>_9<\/BDZHGDJMEQASTMHD>_6 operation of seat functions _5 compliance with transportation regulations _6  ?????    Rear wheel placement/Axle adjustability _7 None _8 semi adjustable _9 fully adjustable  _10 improved UE access to wheels _11 improved stability _12 changing angle in space for improvement of postural stability _13 1-arm drive access <[id number]>_0<\/[id number]>_63 amputee pad placement _15  ?????  Wheel rims/ hand rims  _16 metal  _17 plastic coated _18 oblique projections _19 vertical projections _20 Provide ability to propel manual wheelchair  _21  Increase self-propulsion with hand weakness/decreased grasp   Push handles _22 extended  _23 angle adjustable  _24 standard _25 caregiver access _26 caregiver assist _27 allows "hooking" to enable increased ability to perform ADLs or maintain balance  One armed device  _28 Lt   _29 Rt _30 enable propulsion of manual wheelchair with one arm   _31  ?????   Brake/wheel lock extension _32  Lt   _33  Rt _34 increase indep in applying wheel locks   _35 Side guards _36 prevent clothing getting caught in wheel or becoming soiled _37  prevent skin tears/abrasions  Battery: 2 NF22 _38 to power wheelchair ?????  Other: ????? ????? ?????  The above equipment has a life- long use expectancy. Growth and changes in medical and/or functional conditions would be the exceptions. This is to certify that the therapist has no financial relationship with durable medical provider or manufacturer. The therapist will not receive remuneration of any kind for the equipment recommended in this evaluation.   Patient has mobility limitation that significantly impairs safe, timely participation in one or more mobility related ADL's.  (bathing, toileting, feeding, dressing, grooming, moving from room to room)                                                             _39  Yes _40  No Will mobility device sufficiently improve ability to participate and/or be aided in participation of MRADL's?         _41  Yes _42  No Can limitation be compensated for with use of a cane or walker?                                                                                _43  Yes _44  No Does patient or caregiver demonstrate ability/potential ability & willingness to safely use the mobility device?   _45  Yes _46  No Does patient's home environment support use of recommended mobility device?                                                    _47  Yes _48  No Does patient have sufficient upper extremity function necessary to functionally propel a manual wheelchair?    _49  Yes _50  No Does patient have sufficient strength and trunk stability to safely  operate a POV (scooter)?                                  _51   Yes _0  No Does patient need additional features/benefits provided by a power wheelchair for MRADL's in the home?       _1  Yes _2  No Does the patient demonstrate the ability to safely use a power wheelchair?                                                              _3  Yes _4  No  Therapist Name Printed: Jamey Reas, PT, DPT Date: 02-21-15  Therapist's Signature:   Date:   Supplier's Name Printed: [employer] Date: 2015/02/21  Supplier's Signature:   Date:  Patient/Caregiver Signature:   Date:     This is to certify that I have read this evaluation and do agree with the content within:    Physician's Name Printed: Mauricia Area, MD  48 Signature:  Date:     This is to certify that I, the above signed therapist have the following affiliations: _5  This DME provider _6  Manufacturer of recommended equipment _7  Patient's long term care facility _8  None of the above     wheelchair vendor of patient choice ([facility]) not aware of today's appointment. PT scheduled for home assessment on Tuesday, Feb 11, 2015 at 9:30. Vendor & PT will communicate final recommendations after this assessment has been completed. PT will amend this note at that time so all the information is on one form. Patient in agreement with this plan.                                    Plan - 2015-02-21 1230    Clinical Impression Statement see evaluation note. Patient needs a power w/c with tilt & recline with pressure relief cushion.   Rehab Potential Good   PT Frequency One time visit   Consulted and Agree with Plan of Care Patient          G-Codes - February 21, 2015 1230    Functional Assessment Tool Used MinA transfer with sliding board w/c to mat with 2" difference.   Functional Limitation Changing and maintaining body position   Changing and Maintaining Body Position Current Status [phone]) At  least 80 percent but less than 100 percent impaired, limited or restricted   Changing and Maintaining Body Position Goal Status (Y8502) At least 80 percent but less than 100 percent impaired, limited or restricted   Changing and Maintaining Body Position Discharge Status (D7412) At least 80 percent but less than 100 percent impaired, limited or restricted       Problem List Patient Active Problem List   Diagnosis Date Noted  . Blister head 06/05/2014  . Wound drainage- Right stump 03/06/2014  . Discomfort-Left stump 03/06/2014  . Scab-Right Thumb 03/06/2014  . Polyneuropathy in diabetes(357.2) 10/31/2013  . Other complications due to renal dialysis device, implant, and graft 10/23/2013  . Encounter for therapeutic drug monitoring 10/18/2013  . Chronic systolic heart failure 87/86/7672  . Long term (current) use of anticoagulants 09/20/2013  . HCAP (healthcare-associated pneumonia) 09/18/2013  . Atrial fibrillation with RVR 09/08/2013  . ESRD (end stage renal disease) on dialysis 09/08/2013  . Delayed surgical wound healing of below-the-knee amputation stump 09/08/2013  . Pain in wound 08/22/2013  . Insomnia 06/07/2013  .  Unilateral AKA--left 03/30/2013  . Wound dehiscence, surgical--left groin 03/30/2013  . Leucocytosis 03/30/2013  . Hx of right BKA 03/30/2013  . CAD (coronary artery disease) of artery bypass graft 03/28/2013  . Sepsis 03/28/2013  . Anemia of chronic renal failure 03/28/2013  . Secondary hyperparathyroidism 03/28/2013  . Acute osteomyelitis of foot 03/25/2013  . Atherosclerotic PVD with ulceration 02/20/2013  . CVA (cerebral infarction) 02/12/2013  . Cardiomyopathy, ischemic 02/03/2013  . DM (diabetes mellitus), secondary, w/peripheral vascular complications 04/59/9774  . Pain in limb 01/31/2013  . PVD (peripheral vascular disease) 10/04/2012  . MGUS (monoclonal gammopathy of unknown significance) 07/11/2012  . Infected sebaceous cyst of Right posterior neck.  09/30/2011  . HYPERLIPIDEMIA 07/28/2010  . Essential hypertension 07/28/2010  . Obstructive sleep apnea 07/27/2010  . ABNORMAL HEART RHYTHMS 07/27/2010  . End stage renal disease 07/27/2010    Jamey Reas PT, DPT 02/04/2015, 5:03 PM  [facility] [address] [place], [place], [address] Phone: [phone]   Fax:  [phone]

## 2015-02-05 ENCOUNTER — Ambulatory Visit (INDEPENDENT_AMBULATORY_CARE_PROVIDER_SITE_OTHER): Payer: Medicare Other | Admitting: Family

## 2015-02-05 ENCOUNTER — Encounter: Payer: Self-pay | Admitting: Family

## 2015-02-05 VITALS — BP 118/78 | HR 70 | Temp 98.0°F | Resp 18 | Ht <= 58 in

## 2015-02-05 DIAGNOSIS — Z7409 Other reduced mobility: Secondary | ICD-10-CM | POA: Insufficient documentation

## 2015-02-05 DIAGNOSIS — I639 Cerebral infarction, unspecified: Secondary | ICD-10-CM

## 2015-02-05 DIAGNOSIS — S31809A Unspecified open wound of unspecified buttock, initial encounter: Secondary | ICD-10-CM | POA: Diagnosis not present

## 2015-02-05 NOTE — Patient Instructions (Addendum)
Thank you for choosing [facility].  Summary/Instructions:  Continue to take medications as prescribed.  Contact the [facility] for an appointment - if you need a referral please call us and we will place it.  We will send a request for home health evaluation for mobility with PT.   If your symptoms worsen or fail to improve, please contact our office for further instruction, or in case of emergency go directly to the emergency room at the closest medical facility.

## 2015-02-05 NOTE — Assessment & Plan Note (Signed)
Difficulties with lifting herself out of the chair and into bed as a result of upper extremity weakness and decreased endurance. This process also takes approximately 20-30 minutes with a slide board. Slide board may be the result of her wounds which were unable to be viewed. Recommend physical therapy evaluation for assistance and increasing upper extremity strength and mobility and transfers. Referral to physical therapy placed.

## 2015-02-05 NOTE — Progress Notes (Signed)
Subjective:    Patient ID: [name], female    DOB: [birth year], 59 y.o.   MRN: [id number]  Chief Complaint  Patient presents with  . Establish Care    wants to see about getting home healthcare, she is getting sores on her bottom and feels like she needs someone to keep an eye one them    HPI:  [name] is a 59 y.o. female with a PMH of hypertension, peripheral vascular disease, coronary artery disease, atrial fibrillation with RVR, systolic heart failure, sleep apnea, type 2 diabetes, end-stage renal disease, right below-knee amputation, left above-knee amputation, and insomnia who presents today for an office visit to establish care.  1.) Need for home health - Associated symptoms of sores on her backside has been going on for at least 2 weeks. Sores are located specifically near her gluteal folds and other result of sliding on her board use to transition between chair and bed. Denies any modifying factors that make it better or worse. Indicates she has had several sores in the past which have been cared for by [facility]. She has received previous therapies following her left AKA in 2014. Indicates that she is able to complete her activities of daily living without assistance. Does have difficulty dressing the sores and treating them because she cannot reach them. Indicates that she has difficulty transferring from chair to bed and bed to chair which may take up to 20-30 minutes secondary to upper extremity weakness. She has difficulty reaching above her head.   Allergies  Allergen Reactions  . Neurontin [Gabapentin] Anaphylaxis  . Ezetimibe-Simvastatin Itching and Swelling     facial edema  . Klonopin [Clonazepam] Other (See Comments)    hallucination  . Sertraline Hcl Other (See Comments)    hallucination     Outpatient Prescriptions Prior to Visit  Medication Sig Dispense Refill  . amiodarone (PACERONE) 200 MG tablet Take 1 tablet (200 mg total) by mouth  daily. (Patient taking differently: Take 100 mg by mouth daily. ) 30 tablet 11  . aspirin EC 81 MG tablet Take 81 mg by mouth daily.    [name] atorvastatin (LIPITOR) 10 MG tablet Take 10 mg by mouth daily at 2 PM daily at 2 PM.     . B Complex-C (SUPER B COMPLEX PO) Take 1 tablet by mouth daily at 2 PM daily at 2 PM.    . B Complex-C-Folic Acid (DIALYVITE TABLET) TABS Take 1 tablet by mouth daily at 2 PM daily at 2 PM.   3  . cinacalcet (SENSIPAR) 60 MG tablet Take 60 mg by mouth daily at 2 PM daily at 2 PM.     . HYDROcodone-acetaminophen (NORCO) 5-325 MG per tablet 1-2 tabs po q6 hours prn pain 30 tablet 0  . hydrOXYzine (ATARAX/VISTARIL) 25 MG tablet Take 25 mg by mouth every 6 (six) hours as needed for itching.     . insulin aspart (NOVOLOG FLEXPEN) 100 UNIT/ML FlexPen Inject 2-15 Units into the skin 3 (three) times daily with meals. Per sliding scale    . midodrine (PROAMATINE) 10 MG tablet Take 10 mg by mouth daily at 2 PM daily at 2 PM.     . pregabalin (LYRICA) 25 MG capsule Take 25 mg by mouth daily at 2 PM daily at 2 PM.     . Propylene Glycol 0.6 % SOLN Place 1 drop into the right eye 3 (three) times daily as needed (dry eyes).    [name]  sevelamer carbonate (RENVELA) 800 MG tablet Take 1,600 mg by mouth daily with lunch.     . warfarin (COUMADIN) 4 MG tablet 4 mg. Take One and a half tablet Mon-Wed-Frid  And  Take one tablet Sun-Tue-Thur- Sat.     No facility-administered medications prior to visit.     Past Medical History  Diagnosis Date  . Diabetes mellitus, type 2   . Secondary hyperparathyroidism   . Ischemic cardiomyopathy     a. EF 25-30% 2011;  b.  Echocardiogram (09/01/2013): EF 30-35%, diffuse hypokinesis, MAC, mild MR, mild LAE, mild RVE, severely reduced RVSF, moderate RAE, mildly increased PASP.  [name] Pulmonary hypertension     Est PAsyst 60-Dr Linard Millers, off coumadin, CT neg for PE  . Hyperlipidemia   . Anemia   . Thyroid disease   . Pulmonary edema   . Peripheral vascular  disease     a. Prior LE amputations. b. s/p prior LE bypass surgery.  . Sebaceous cyst   . 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 Richardson Dopp  . Shortness of breath     with exertion  . Hemodialysis patient     M,W,F  . TIA (transient ischemic attack) June 2014  . Ulcer of heel due to diabetes     left  . Kidney disease Dialysis MWF    ESRD secondary to DM, failed transplant 2006. Dialysis  . Atrial fibrillation     a. Dx 08/2013, placed on amiodarone.  . Chronic systolic CHF (congestive heart failure)   . CAD (coronary artery disease)     a. s/p CABG 2011.;  b. Lexiscan Myoview (02/2013): Intermediate risk study; EF 50%, basal inferolateral, basal anterolateral and mid inferolateral moderate ischemia; basal anterior, basal anteroseptal and mid anterior scar;  c. Type 2 NSTEMI during admission for LE revasc surgery 02/2013 in setting of SVT  . Polyneuropathy in diabetes(357.2) 10/31/2013  . Neuropathy   . OSA (obstructive sleep apnea)     uses c-papNPSG 11.23.11-AHI 64.9/hr-central and obstructive apnea  . Headache   . Carpal tunnel syndrome, right      Past Surgical History  Procedure Laterality Date  . Coronary artery bypass graft  2011    3V  . Insertion of dialysis catheter    . Above knee leg amputation Left     infection/gangrene 10/11/07  . Nasal mucosal biopsy    . Ovarian cyst surgery    . 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 Laser retinal surgery  2011  . Kidney transplant      rejection  . Abdominal angiogram Left 01/25/13  . Colonoscopy    . Cardiac catheterization    . Femoral-popliteal bypass graft Left 02/20/2013    Procedure:  FEMORAL-to below knee POPLITEAL ARTERY bypasswith saphenous vein with intraoperative angiogram ;  Surgeon: Angelia Mould, MD;  Location: [facility];  Service: Vascular;  Laterality: Left;  fem-below knee pop  . Amputation Left 03/27/2013    Procedure: AMPUTATION ABOVE KNEE;  Surgeon: Angelia Mould, MD;  Location: [facility];  Service: Vascular;  Laterality: Left;  . Tee without cardioversion N/A 09/11/2013    Procedure: TRANSESOPHAGEAL ECHOCARDIOGRAM (TEE);  Surgeon: Casandra Doffing, MD;  Location: [facility] ENDOSCOPY;  Service: Cardiovascular;  Laterality: N/A;  . Cardioversion N/A 09/11/2013    Procedure: CARDIOVERSION;  Surgeon: Casandra Doffing, MD;  Location: [facility] ENDOSCOPY;  Service: Cardiovascular;  Laterality: N/A;  . Below knee leg amputation Right   . Revison of  arteriovenous fistula Right 11/21/2013    Procedure: REVISON OF ARTERIOVENOUS FISTULA;  Surgeon: Rosetta Posner, MD;  Location: [facility];  Service: Vascular;  Laterality: Right;  . Cataract surgery Right 01-07-2014  . Lower extremity angiogram Left 01/25/2013    Procedure: LOWER EXTREMITY ANGIOGRAM;  Surgeon: Conrad Towanda, MD;  Location: [facility] CATH LAB;  Service: Cardiovascular;  Laterality: Left;  . Colonoscopy with propofol N/A 12/12/2014    Procedure: COLONOSCOPY WITH PROPOFOL;  Surgeon: Laurence Spates, MD;  Location: [facility];  Service: Endoscopy;  Laterality: N/A;  . Eye surgery    . Eye surgery Left November 28, 2014    Laser   . I&d extremity Right 01/30/2015    Procedure: A CELL PLACEMENT, RIGHT LONG FINGER WITH DEBRIDEMENT OF WOUND ;  Surgeon: Leanora Cover, MD;  Location: [facility];  Service: Orthopedics;  Laterality: Right;     Family History  Problem Relation Age of Onset  . Heart disease    . Edema Father     fluid overload  . Diabetes Father   . Cerebral aneurysm Mother   . Diabetes Mother   . Stroke Brother   . Heart disease Brother   . Diabetes Brother   . Hyperlipidemia Brother   . Hypertension Brother   . Peripheral vascular disease Brother     amputation     History   Social History  . Marital Status: Single    Spouse Name: N/A  . Number of Children: 0  . Years of Education: 18   Occupational History  . Retired / Disability    Social History Main Topics  . Smoking status: Never  Smoker   . Smokeless tobacco: Former Systems developer    Types: Snuff, Chew  . Alcohol Use: No  . Drug Use: No  . Sexual Activity: No   Other Topics Concern  . Not on file   Social History Narrative   Fun: Involved in social things, patient support group in kidney center and amputee support group.   Denies religious beliefs effecting healthcare.     Review of Systems  Constitutional: Negative for fever and chills.  Skin: Positive for wound.      Objective:    BP 118/78 mmHg  Pulse 70  Temp(Src) 98 F (36.7 C) (Oral)  Resp 18  Ht 4\' 8"  (1.422 m)  Wt   SpO2 95% Nursing note and vital signs reviewed.  Physical Exam  Constitutional: She is oriented to person, place, and time. She appears well-developed and well-nourished. No distress.  Cardiovascular: Normal rate, regular rhythm, normal heart sounds and intact distal pulses.   Pulmonary/Chest: Effort normal and breath sounds normal.  Musculoskeletal:  Bilateral upper extremity muscle strength is 4+ out of 5 in all directions. Patient has difficulty doing press ups and lifting herself out of the chair.  Neurological: She is alert and oriented to person, place, and time.  Skin: Skin is warm and dry.  Unable to currently view wounds secondary to inability to move patient to exam table because of height of exam tables.  Psychiatric: She has a normal mood and affect. Her behavior is normal. Judgment and thought content normal.       Assessment & Plan:   Problem List Items Addressed This Visit      Other   Decreased mobility and endurance - Primary    Difficulties with lifting herself out of the chair and into bed as a result of upper extremity weakness and decreased endurance.  This process also takes approximately 20-30 minutes with a slide board. Slide board may be the result of her wounds which were unable to be viewed. Recommend physical therapy evaluation for assistance and increasing upper extremity strength and mobility and  transfers. Referral to physical therapy placed.      Relevant Orders   Ambulatory referral to Physical Therapy   Wound of buttock    Unable to view wounds secondary to facilities. Discussed treatment with patient and she will contact the wound center for further evaluation. If symptoms worsen or fail to improve return to clinic.

## 2015-02-05 NOTE — Progress Notes (Signed)
Pre visit review using our clinic review tool, if applicable. No additional management support is needed unless otherwise documented below in the visit note. 

## 2015-02-05 NOTE — Assessment & Plan Note (Signed)
Unable to view wounds secondary to facilities. Discussed treatment with patient and she will contact the wound center for further evaluation. If symptoms worsen or fail to improve return to clinic.

## 2015-02-14 ENCOUNTER — Ambulatory Visit (INDEPENDENT_AMBULATORY_CARE_PROVIDER_SITE_OTHER): Payer: Medicare Other

## 2015-02-14 DIAGNOSIS — I4891 Unspecified atrial fibrillation: Secondary | ICD-10-CM | POA: Diagnosis not present

## 2015-02-14 DIAGNOSIS — Z5181 Encounter for therapeutic drug level monitoring: Secondary | ICD-10-CM

## 2015-02-14 DIAGNOSIS — I639 Cerebral infarction, unspecified: Secondary | ICD-10-CM | POA: Diagnosis not present

## 2015-02-14 DIAGNOSIS — Z7901 Long term (current) use of anticoagulants: Secondary | ICD-10-CM

## 2015-02-14 LAB — POCT INR: INR: 3.7

## 2015-02-15 ENCOUNTER — Ambulatory Visit (INDEPENDENT_AMBULATORY_CARE_PROVIDER_SITE_OTHER): Payer: Medicare Other | Admitting: Emergency Medicine

## 2015-02-15 VITALS — BP 120/72 | HR 70 | Temp 97.8°F | Resp 18

## 2015-02-15 DIAGNOSIS — L8992 Pressure ulcer of unspecified site, stage 2: Secondary | ICD-10-CM | POA: Diagnosis not present

## 2015-02-15 DIAGNOSIS — I639 Cerebral infarction, unspecified: Secondary | ICD-10-CM | POA: Diagnosis not present

## 2015-02-15 NOTE — Progress Notes (Signed)
Subjective:  Patient ID: [name], female    DOB: November 20, 1955  Age: 59 y.o. MRN: [id number]  CC: Sores   HPI [name] presents  with pain in her buttock. 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 She's wheelchair-bound. She's able to transfer from bed to chair and chair to bed. She over the last several weeks developed a pain in her left buttock. Is worse when she sits. No drainage. No fever or chills. Pain is not radiating. She has no history of direct injury.  He has no nausea or vomiting. No stool change. She has no fever or chills. No cough or coryza. No dysuria urgency or frequency. No vaginal discharge.  She's had no improvement with his symptoms with over-the-counter medication. She's unable to attend of the wound itself because of lack of access and the fact that she cannot see it.  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   Her  family history includes Cerebral aneurysm in her mother; Diabetes in her brother, father, and mother; Edema in her father; Heart disease in her brother and another family member; Hyperlipidemia in her brother; Hypertension in her brother; Peripheral vascular disease in her brother; Stroke in her brother.  She   reports that she has never smoked. She has quit using smokeless tobacco. Her smokeless tobacco use included Snuff and Chew. She reports that she does not drink alcohol or use illicit drugs.  Outpatient Prescriptions Prior to Visit  Medication Sig Dispense Refill  . amiodarone (PACERONE) 200 MG tablet Take 1 tablet (200 mg total) by mouth daily. (Patient taking differently: Take 100 mg by mouth daily. ) 30 tablet 11  . aspirin EC 81 MG tablet Take 81 mg by mouth daily.    [name] atorvastatin (LIPITOR) 10 MG tablet Take 10 mg by mouth daily at 2 PM daily at 2 PM.     . B Complex-C (SUPER B COMPLEX PO) Take 1 tablet by mouth daily at 2 PM daily at 2 PM.    . B Complex-C-Folic Acid (DIALYVITE TABLET) TABS Take 1 tablet by mouth daily at 2 PM daily at 2 PM.   3  . cinacalcet (SENSIPAR) 60 MG tablet Take 60 mg by mouth daily at 2 PM daily at 2 PM.     . diphenoxylate-atropine (LOMOTIL) 2.5-0.025 MG per tablet Take by mouth 4 (four) times daily as needed for diarrhea or loose stools.    [name]  HYDROcodone-acetaminophen (NORCO) 5-325 MG per tablet 1-2 tabs po q6 hours prn pain 30 tablet 0  . hydrOXYzine (ATARAX/VISTARIL) 25 MG tablet Take 25 mg by mouth every 6 (six) hours as needed for itching.      . insulin aspart (NOVOLOG FLEXPEN) 100 UNIT/ML FlexPen Inject 2-15 Units into the skin 3 (three) times daily with meals. Per sliding scale    . midodrine (PROAMATINE) 10 MG tablet Take 10 mg by mouth daily at 2 PM daily at 2 PM.     . pregabalin (LYRICA) 25 MG capsule Take 25 mg by mouth daily at 2 PM daily at 2 PM.     . Propylene Glycol 0.6 % SOLN Place 1 drop into the right eye 3 (three) times daily as needed (dry eyes).    . sevelamer carbonate (RENVELA) 800 MG tablet Take 1,600 mg by mouth daily with lunch.     . warfarin (COUMADIN) 4 MG tablet 4 mg. Take One and a half tablet Mon-Wed-Frid  And  Take one tablet Sun-Tue-Thur- Sat.     No facility-administered medications prior to visit.    History   Social History  . Marital Status: Single    Spouse Name: N/A  . Number of Children: 0  . Years of Education: 18   Occupational History  . Retired / Disability    Social History Main Topics  . Smoking status: Never Smoker   . Smokeless tobacco: Former Systems developer    Types: Snuff, Chew  . Alcohol Use: No  . Drug Use: No  . Sexual Activity: No   Other Topics Concern  . None   Social History Narrative   Fun: Involved in social things, patient support group in kidney center and amputee support group.   Denies religious beliefs effecting healthcare.      Review of Systems  Constitutional: Negative for fever, chills and appetite change.  HENT: Negative for congestion, ear pain, postnasal drip, sinus pressure and sore throat.   Eyes: Negative for pain and redness.  Respiratory: Negative for cough, shortness of breath and wheezing.   Cardiovascular: Negative for leg swelling.  Gastrointestinal: Negative for nausea, vomiting, abdominal pain, diarrhea, constipation and blood in stool.  Endocrine: Negative for polyuria.  Genitourinary: Negative for dysuria, urgency, frequency and flank pain.  Musculoskeletal: Negative for gait problem (Right BKA  Left AKA).  Skin: Positive for wound (on  buttocks). Negative for rash.  Neurological: Negative for weakness and headaches.  Psychiatric/Behavioral: Negative for confusion and decreased concentration. The patient is not nervous/anxious.     Objective:  BP 120/72 mmHg  Pulse 70  Temp(Src) 97.8 F (36.6 C) (Oral)  Resp 18  SpO2 91%  Physical Exam  Constitutional: She is oriented to person, place, and time. She appears well-developed and well-nourished. No distress.  HENT:  Head: Normocephalic and atraumatic.  Right Ear: External ear normal.  Left Ear: External ear normal.  Nose: Nose normal.  Eyes: Conjunctivae and EOM are normal. Pupils are equal, round, and reactive to light. No scleral icterus.  Neck: Normal range of motion. Neck supple. No tracheal deviation present.  Cardiovascular: Normal rate, regular rhythm and normal heart sounds.   Pulmonary/Chest: Effort normal. No respiratory distress. She has no wheezes. She has no rales.  Abdominal: She exhibits no mass. There is no tenderness. There is no rebound and no guarding.  Musculoskeletal: She exhibits no edema.       Right shoulder: She exhibits deformity (RIGHT BKA  LEFT AKA).  Lymphadenopathy:  She has no cervical adenopathy.  Neurological: She is alert and oriented to person, place, and time. Coordination normal.  Skin: Skin is warm and dry. No rash noted.  Psychiatric: She has a normal mood and affect. Her behavior is normal.      Assessment & Plan:   [name] was seen today for sores.  Diagnoses and all orders for this visit:  Pressure ulcer stage II Orders: -     Ambulatory referral to [place] am having [name] maintain her atorvastatin, cinacalcet, midodrine, pregabalin, sevelamer carbonate, hydrOXYzine, insulin aspart, aspirin EC, amiodarone, DIALYVITE TABLET, B Complex-C (SUPER B COMPLEX PO), Propylene Glycol, warfarin, HYDROcodone-acetaminophen, and diphenoxylate-atropine.  No orders of the defined types were placed in this  encounter.   Patient referred to home health care. An urgent request was placed due to concerns and if this is not cared for and she developed significant ulceration. This would lead to increased morbidity morbidity and mortality. And be more costly.   Appropriate red flag conditions were discussed with the patient as well as actions that should be taken.  Patient expressed his understanding.  Follow-up: Return in about 2 weeks (around 03/01/2015).  Roselee Culver, MD

## 2015-02-15 NOTE — Patient Instructions (Signed)
Skin Ulcer  A skin ulcer is an open sore that can be shallow or deep. Skin ulcers sometimes become infected and are difficult to treat. It may be 1 month or longer before real healing progress is made.  CAUSES    Injury.   Problems with the veins or arteries.   Diabetes.   Insect bites.   Bedsores.   Inflammatory conditions.  SYMPTOMS    Pain, redness, swelling, and tenderness around the ulcer.   Fever.   Bleeding from the ulcer.   Yellow or clear fluid coming from the ulcer.  DIAGNOSIS   There are many types of skin ulcers. Any open sores will be examined. Certain tests will be done to determine the kind of ulcer you have. The right treatment depends on the type of ulcer you have.  TREATMENT   Treatment is a long-term challenge. It may include:   Wearing an elastic wrap, compression stockings, or gel cast over the ulcer area.   Taking antibiotic medicines or putting antibiotic creams on the affected area if there is an infection.  HOME CARE INSTRUCTIONS   Put on your bandages (dressings), wraps, or casts over the ulcer as directed by your caregiver.   Change all dressings as directed by your caregiver.   Take all medicines as directed by your caregiver.   Keep the affected area clean and dry.   Avoid injuries to the affected area.   Eat a well-balanced, healthy diet that includes plenty of fruit and vegetables.   If you smoke, consider quitting or decreasing the amount of cigarettes you smoke.   Once the ulcer heals, get regular exercise as directed by your caregiver.   Work with your caregiver to make sure your blood pressure, cholesterol, and diabetes are well-controlled.   Keep your skin moisturized. Dry skin can crack and lead to skin ulcers.  SEEK IMMEDIATE MEDICAL CARE IF:    Your pain gets worse.   You have swelling, redness, or fluids around the ulcer.   You have chills.   You have a fever.  MAKE SURE YOU:    Understand these instructions.   Will watch your condition.   Will get  help right away if you are not doing well or get worse.  Document Released: 10/07/2004 Document Revised: 11/22/2011 Document Reviewed: 04/16/2011  ExitCare Patient Information 2015 [employer]. This information is not intended to replace advice given to you by your health care provider. Make sure you discuss any questions you have with your health care provider.

## 2015-02-20 ENCOUNTER — Telehealth: Payer: Self-pay | Admitting: Family

## 2015-02-20 NOTE — Telephone Encounter (Signed)
Returned [facility]. LVM for her to call back

## 2015-02-20 NOTE — Telephone Encounter (Signed)
[name] [phone]  Wound care  2 week 5 1 week 3 2 prns for wound care   PT to eval and treat

## 2015-02-25 ENCOUNTER — Ambulatory Visit: Payer: Medicare Other | Admitting: Internal Medicine

## 2015-02-27 ENCOUNTER — Ambulatory Visit (INDEPENDENT_AMBULATORY_CARE_PROVIDER_SITE_OTHER): Payer: Medicare Other | Admitting: *Deleted

## 2015-02-27 DIAGNOSIS — Z5181 Encounter for therapeutic drug level monitoring: Secondary | ICD-10-CM

## 2015-02-27 DIAGNOSIS — I639 Cerebral infarction, unspecified: Secondary | ICD-10-CM | POA: Diagnosis not present

## 2015-02-27 DIAGNOSIS — Z7901 Long term (current) use of anticoagulants: Secondary | ICD-10-CM | POA: Diagnosis not present

## 2015-02-27 DIAGNOSIS — I4891 Unspecified atrial fibrillation: Secondary | ICD-10-CM | POA: Diagnosis not present

## 2015-02-27 LAB — POCT INR: INR: 3.1

## 2015-03-04 ENCOUNTER — Ambulatory Visit (INDEPENDENT_AMBULATORY_CARE_PROVIDER_SITE_OTHER): Payer: Medicare Other | Admitting: Surgery

## 2015-03-04 DIAGNOSIS — I4891 Unspecified atrial fibrillation: Secondary | ICD-10-CM

## 2015-03-04 DIAGNOSIS — Z7901 Long term (current) use of anticoagulants: Secondary | ICD-10-CM

## 2015-03-04 DIAGNOSIS — I639 Cerebral infarction, unspecified: Secondary | ICD-10-CM

## 2015-03-04 DIAGNOSIS — Z5181 Encounter for therapeutic drug level monitoring: Secondary | ICD-10-CM | POA: Diagnosis not present

## 2015-03-04 LAB — POCT INR: INR: 5.2

## 2015-03-05 ENCOUNTER — Other Ambulatory Visit: Payer: Self-pay | Admitting: Orthopedic Surgery

## 2015-03-05 ENCOUNTER — Encounter (HOSPITAL_BASED_OUTPATIENT_CLINIC_OR_DEPARTMENT_OTHER): Payer: Self-pay | Admitting: *Deleted

## 2015-03-06 ENCOUNTER — Ambulatory Visit (HOSPITAL_BASED_OUTPATIENT_CLINIC_OR_DEPARTMENT_OTHER): Payer: Medicare Other | Admitting: Anesthesiology

## 2015-03-06 ENCOUNTER — Encounter (HOSPITAL_BASED_OUTPATIENT_CLINIC_OR_DEPARTMENT_OTHER): Payer: Self-pay

## 2015-03-06 ENCOUNTER — Ambulatory Visit (HOSPITAL_BASED_OUTPATIENT_CLINIC_OR_DEPARTMENT_OTHER)
Admission: RE | Admit: 2015-03-06 | Discharge: 2015-03-06 | Disposition: A | Discharge status: Home / Self Care | Payer: Medicare Other | Source: Ambulatory Visit | Attending: Orthopedic Surgery | Admitting: Orthopedic Surgery

## 2015-03-06 ENCOUNTER — Encounter (HOSPITAL_BASED_OUTPATIENT_CLINIC_OR_DEPARTMENT_OTHER)
Admission: RE | Disposition: A | Discharge status: Home / Self Care | Payer: Self-pay | Source: Ambulatory Visit | Attending: Orthopedic Surgery

## 2015-03-06 ENCOUNTER — Telehealth: Payer: Self-pay | Admitting: Family

## 2015-03-06 DIAGNOSIS — Z87891 Personal history of nicotine dependence: Secondary | ICD-10-CM | POA: Diagnosis not present

## 2015-03-06 DIAGNOSIS — E1122 Type 2 diabetes mellitus with diabetic chronic kidney disease: Secondary | ICD-10-CM | POA: Insufficient documentation

## 2015-03-06 DIAGNOSIS — N2581 Secondary hyperparathyroidism of renal origin: Secondary | ICD-10-CM | POA: Diagnosis not present

## 2015-03-06 DIAGNOSIS — Z89612 Acquired absence of left leg above knee: Secondary | ICD-10-CM | POA: Insufficient documentation

## 2015-03-06 DIAGNOSIS — G4733 Obstructive sleep apnea (adult) (pediatric): Secondary | ICD-10-CM | POA: Insufficient documentation

## 2015-03-06 DIAGNOSIS — E1152 Type 2 diabetes mellitus with diabetic peripheral angiopathy with gangrene: Secondary | ICD-10-CM | POA: Diagnosis not present

## 2015-03-06 DIAGNOSIS — I4891 Unspecified atrial fibrillation: Secondary | ICD-10-CM | POA: Insufficient documentation

## 2015-03-06 DIAGNOSIS — Z8673 Personal history of transient ischemic attack (TIA), and cerebral infarction without residual deficits: Secondary | ICD-10-CM | POA: Insufficient documentation

## 2015-03-06 DIAGNOSIS — N186 End stage renal disease: Secondary | ICD-10-CM | POA: Insufficient documentation

## 2015-03-06 DIAGNOSIS — I739 Peripheral vascular disease, unspecified: Secondary | ICD-10-CM | POA: Insufficient documentation

## 2015-03-06 DIAGNOSIS — I5022 Chronic systolic (congestive) heart failure: Secondary | ICD-10-CM | POA: Diagnosis not present

## 2015-03-06 DIAGNOSIS — I2510 Atherosclerotic heart disease of native coronary artery without angina pectoris: Secondary | ICD-10-CM | POA: Diagnosis not present

## 2015-03-06 DIAGNOSIS — Z992 Dependence on renal dialysis: Secondary | ICD-10-CM | POA: Diagnosis not present

## 2015-03-06 DIAGNOSIS — Z9989 Dependence on other enabling machines and devices: Secondary | ICD-10-CM | POA: Insufficient documentation

## 2015-03-06 DIAGNOSIS — M868X4 Other osteomyelitis, hand: Secondary | ICD-10-CM | POA: Insufficient documentation

## 2015-03-06 DIAGNOSIS — M329 Systemic lupus erythematosus, unspecified: Secondary | ICD-10-CM | POA: Insufficient documentation

## 2015-03-06 DIAGNOSIS — Z951 Presence of aortocoronary bypass graft: Secondary | ICD-10-CM | POA: Diagnosis not present

## 2015-03-06 DIAGNOSIS — I96 Gangrene, not elsewhere classified: Secondary | ICD-10-CM | POA: Diagnosis present

## 2015-03-06 DIAGNOSIS — E1142 Type 2 diabetes mellitus with diabetic polyneuropathy: Secondary | ICD-10-CM | POA: Diagnosis not present

## 2015-03-06 DIAGNOSIS — I272 Other secondary pulmonary hypertension: Secondary | ICD-10-CM | POA: Insufficient documentation

## 2015-03-06 HISTORY — PX: AMPUTATION: SHX166

## 2015-03-06 LAB — POCT I-STAT, CHEM 8
BUN: 24 mg/dL — ABNORMAL HIGH (ref 6–20)
Calcium, Ion: 1.09 mmol/L — ABNORMAL LOW (ref 1.13–1.30)
Chloride: 95 mmol/L — ABNORMAL LOW (ref 101–111)
Creatinine, Ser: 3.4 mg/dL — ABNORMAL HIGH (ref 0.44–1.00)
GLUCOSE: 100 mg/dL — AB (ref 65–99)
HCT: 51 % — ABNORMAL HIGH (ref 36.0–46.0)
HEMOGLOBIN: 17.3 g/dL — AB (ref 12.0–15.0)
Potassium: 3.9 mmol/L (ref 3.5–5.1)
Sodium: 133 mmol/L — ABNORMAL LOW (ref 135–145)
TCO2: 24 mmol/L (ref 0–100)

## 2015-03-06 LAB — PROTIME-INR
INR: 2.5 — AB (ref 0.00–1.49)
PROTHROMBIN TIME: 26.7 s — AB (ref 11.6–15.2)

## 2015-03-06 LAB — GLUCOSE, CAPILLARY: Glucose-Capillary: 95 mg/dL (ref 65–99)

## 2015-03-06 LAB — APTT: aPTT: 46 seconds — ABNORMAL HIGH (ref 24–37)

## 2015-03-06 SURGERY — AMPUTATION DIGIT
Anesthesia: General | Site: Finger | Laterality: Right

## 2015-03-06 MED ORDER — BUPIVACAINE HCL (PF) 0.25 % IJ SOLN
INTRAMUSCULAR | Status: AC
  Filled 2015-03-06: qty 30

## 2015-03-06 MED ORDER — LIDOCAINE HCL (CARDIAC) 20 MG/ML IV SOLN
INTRAVENOUS | Status: DC | PRN
  Administered 2015-03-06: 40 mg via INTRAVENOUS

## 2015-03-06 MED ORDER — FENTANYL CITRATE (PF) 100 MCG/2ML IJ SOLN
INTRAMUSCULAR | Status: AC
  Filled 2015-03-06: qty 6

## 2015-03-06 MED ORDER — MIDAZOLAM HCL 2 MG/2ML IJ SOLN
INTRAMUSCULAR | Status: AC
  Filled 2015-03-06: qty 2

## 2015-03-06 MED ORDER — PROPOFOL 10 MG/ML IV BOLUS
INTRAVENOUS | Status: DC | PRN
  Administered 2015-03-06: 100 mg via INTRAVENOUS

## 2015-03-06 MED ORDER — ONDANSETRON HCL 4 MG/2ML IJ SOLN: 4.0000 mg | Freq: Once | INTRAMUSCULAR | Status: DC | PRN

## 2015-03-06 MED ORDER — MIDAZOLAM HCL 2 MG/2ML IJ SOLN
1.0000 mg | INTRAMUSCULAR | Status: DC | PRN
  Administered 2015-03-06: 1 mg via INTRAVENOUS

## 2015-03-06 MED ORDER — OXYCODONE HCL 5 MG/5ML PO SOLN: 5.0000 mg | Freq: Once | ORAL | Status: DC | PRN

## 2015-03-06 MED ORDER — BUPIVACAINE HCL (PF) 0.25 % IJ SOLN
INTRAMUSCULAR | Status: DC | PRN
  Administered 2015-03-06: 10 mL

## 2015-03-06 MED ORDER — SCOPOLAMINE 1 MG/3DAYS TD PT72: 1.0000 | MEDICATED_PATCH | Freq: Once | TRANSDERMAL | Status: DC | PRN

## 2015-03-06 MED ORDER — CHLORHEXIDINE GLUCONATE 4 % EX LIQD: 60.0000 mL | Freq: Once | CUTANEOUS | Status: DC

## 2015-03-06 MED ORDER — HYDROCODONE-ACETAMINOPHEN 5-325 MG PO TABS: ORAL_TABLET | ORAL | Status: DC

## 2015-03-06 MED ORDER — SODIUM CHLORIDE 0.9 % IV SOLN
INTRAVENOUS | Status: DC
  Administered 2015-03-06 (×2): via INTRAVENOUS

## 2015-03-06 MED ORDER — FENTANYL CITRATE (PF) 100 MCG/2ML IJ SOLN: 25.0000 ug | INTRAMUSCULAR | Status: DC | PRN

## 2015-03-06 MED ORDER — PHENYLEPHRINE HCL 10 MG/ML IJ SOLN
10.0000 mg | INTRAVENOUS | Status: DC | PRN
  Administered 2015-03-06: 50 ug/min via INTRAVENOUS

## 2015-03-06 MED ORDER — DEXAMETHASONE SODIUM PHOSPHATE 4 MG/ML IJ SOLN
INTRAMUSCULAR | Status: DC | PRN
  Administered 2015-03-06: 4 mg via INTRAVENOUS

## 2015-03-06 MED ORDER — CEFAZOLIN SODIUM-DEXTROSE 2-3 GM-% IV SOLR
2.0000 g | INTRAVENOUS | Status: AC
  Administered 2015-03-06: 2 g via INTRAVENOUS

## 2015-03-06 MED ORDER — FENTANYL CITRATE (PF) 100 MCG/2ML IJ SOLN
50.0000 ug | INTRAMUSCULAR | Status: DC | PRN
  Administered 2015-03-06: 50 ug via INTRAVENOUS

## 2015-03-06 MED ORDER — OXYCODONE HCL 5 MG PO TABS: 5.0000 mg | ORAL_TABLET | Freq: Once | ORAL | Status: DC | PRN

## 2015-03-06 MED ORDER — LACTATED RINGERS IV SOLN: INTRAVENOUS | Status: DC

## 2015-03-06 MED ORDER — ONDANSETRON HCL 4 MG/2ML IJ SOLN
INTRAMUSCULAR | Status: DC | PRN
  Administered 2015-03-06: 4 mg via INTRAVENOUS

## 2015-03-06 MED ORDER — GLYCOPYRROLATE 0.2 MG/ML IJ SOLN: 0.2000 mg | Freq: Once | INTRAMUSCULAR | Status: DC | PRN

## 2015-03-06 SURGICAL SUPPLY — 52 items
BANDAGE ELASTIC 3 VELCRO ST LF (GAUZE/BANDAGES/DRESSINGS) IMPLANT
BLADE MINI RND TIP GREEN BEAV (BLADE) IMPLANT
BLADE SURG 15 STRL LF DISP TIS (BLADE) ×2 IMPLANT
BLADE SURG 15 STRL SS (BLADE) ×6
BNDG CMPR 9X4 STRL LF SNTH (GAUZE/BANDAGES/DRESSINGS) ×1
BNDG CMPR MD 5X2 ELC HKLP STRL (GAUZE/BANDAGES/DRESSINGS)
BNDG COHESIVE 1X5 TAN STRL LF (GAUZE/BANDAGES/DRESSINGS) ×2 IMPLANT
BNDG ELASTIC 2 VLCR STRL LF (GAUZE/BANDAGES/DRESSINGS) IMPLANT
BNDG ESMARK 4X9 LF (GAUZE/BANDAGES/DRESSINGS) ×2 IMPLANT
BNDG GAUZE 1X2.1 STRL (MISCELLANEOUS) IMPLANT
CHLORAPREP W/TINT 26ML (MISCELLANEOUS) ×2 IMPLANT
CORDS BIPOLAR (ELECTRODE) ×3 IMPLANT
COVER BACK TABLE 60X90IN (DRAPES) ×3 IMPLANT
COVER MAYO STAND STRL (DRAPES) ×3 IMPLANT
DECANTER SPIKE VIAL GLASS SM (MISCELLANEOUS) IMPLANT
DRAIN PENROSE 1/4X12 LTX STRL (WOUND CARE) IMPLANT
DRAPE EXTREMITY T 121X128X90 (DRAPE) ×3 IMPLANT
DRAPE OEC MINIVIEW 54X84 (DRAPES) IMPLANT
DRAPE SURG 17X23 STRL (DRAPES) ×3 IMPLANT
GAUZE SPONGE 4X4 12PLY STRL (GAUZE/BANDAGES/DRESSINGS) ×3 IMPLANT
GAUZE XEROFORM 1X8 LF (GAUZE/BANDAGES/DRESSINGS) ×3 IMPLANT
GLOVE BIO SURGEON STRL SZ7 (GLOVE) ×2 IMPLANT
GLOVE BIO SURGEON STRL SZ7.5 (GLOVE) ×3 IMPLANT
GLOVE BIOGEL PI IND STRL 7.0 (GLOVE) IMPLANT
GLOVE BIOGEL PI IND STRL 7.5 (GLOVE) IMPLANT
GLOVE BIOGEL PI IND STRL 8 (GLOVE) ×1 IMPLANT
GLOVE BIOGEL PI INDICATOR 7.0 (GLOVE) ×2
GLOVE BIOGEL PI INDICATOR 7.5 (GLOVE) ×2
GLOVE BIOGEL PI INDICATOR 8 (GLOVE) ×4
GOWN STRL REUS W/ TWL LRG LVL3 (GOWN DISPOSABLE) ×1 IMPLANT
GOWN STRL REUS W/TWL LRG LVL3 (GOWN DISPOSABLE) ×3
GOWN STRL REUS W/TWL XL LVL3 (GOWN DISPOSABLE) ×3 IMPLANT
NDL HYPO 25X1 1.5 SAFETY (NEEDLE) IMPLANT
NEEDLE HYPO 25X1 1.5 SAFETY (NEEDLE) ×3 IMPLANT
NS IRRIG 1000ML POUR BTL (IV SOLUTION) ×3 IMPLANT
PACK BASIN DAY SURGERY FS (CUSTOM PROCEDURE TRAY) ×3 IMPLANT
PADDING CAST ABS 4INX4YD NS (CAST SUPPLIES) ×2
PADDING CAST ABS COTTON 4X4 ST (CAST SUPPLIES) ×1 IMPLANT
SPONGE GAUZE 4X4 12PLY STER LF (GAUZE/BANDAGES/DRESSINGS) IMPLANT
STOCKINETTE 4X48 STRL (DRAPES) ×3 IMPLANT
SUT CHROMIC 4 0 P 3 18 (SUTURE) IMPLANT
SUT ETHILON 3 0 PS 1 (SUTURE) IMPLANT
SUT ETHILON 4 0 PS 2 18 (SUTURE) IMPLANT
SUT MERSILENE 4 0 P 3 (SUTURE) IMPLANT
SUT MON AB 5-0 P3 18 (SUTURE) ×2 IMPLANT
SUT VIC AB 4-0 P-3 18XBRD (SUTURE) IMPLANT
SUT VIC AB 4-0 P3 18 (SUTURE)
SYR BULB 3OZ (MISCELLANEOUS) ×3 IMPLANT
SYR CONTROL 10ML LL (SYRINGE) ×2 IMPLANT
TOWEL OR 17X24 6PK STRL BLUE (TOWEL DISPOSABLE) ×3 IMPLANT
TRAY DSU PREP LF (CUSTOM PROCEDURE TRAY) ×3 IMPLANT
UNDERPAD 30X30 (UNDERPADS AND DIAPERS) ×3 IMPLANT

## 2015-03-06 NOTE — Transfer of Care (Signed)
Immediate Anesthesia Transfer of Care Note  Patient: [name]  Procedure(s) Performed: Procedure(s): RIGHT LONG FINGER AMPUTATION  (Right)  Patient Location: PACU  Anesthesia Type:General  Level of Consciousness: sedated and patient cooperative  Airway & Oxygen Therapy: Patient Spontanous Breathing and Patient connected to face mask oxygen  Post-op Assessment: Report given to RN and Post -op Vital signs reviewed and stable  Post vital signs: Reviewed and stable  Last Vitals:  Filed Vitals:   03/06/15 1012  BP: 101/63  Pulse: 121  Temp: 36.7 C  Resp: 20    Complications: No apparent anesthesia complications

## 2015-03-06 NOTE — Anesthesia Procedure Notes (Signed)
Procedure Name: LMA Insertion Date/Time: 03/06/2015 12:22 PM Performed by: Lyndee Leo Pre-anesthesia Checklist: Patient identified, Emergency Drugs available, Suction available and Patient being monitored Patient Re-evaluated:Patient Re-evaluated prior to inductionOxygen Delivery Method: Circle System Utilized Preoxygenation: Pre-oxygenation with 100% oxygen Intubation Type: IV induction Ventilation: Mask ventilation without difficulty LMA: LMA inserted LMA Size: 4.0 Number of attempts: 1 Airway Equipment and Method: Bite block Placement Confirmation: positive ETCO2 Tube secured with: Tape Dental Injury: Teeth and Oropharynx as per pre-operative assessment

## 2015-03-06 NOTE — Brief Op Note (Signed)
03/06/2015  12:56 PM  PATIENT:  Bartholome Bill  59 y.o. female  PRE-OPERATIVE DIAGNOSIS:  RIGHT LONG FINGER GANGENE  POST-OPERATIVE DIAGNOSIS:  right long finger gangrene  PROCEDURE:  Procedure(s): RIGHT LONG FINGER AMPUTATION  (Right)  SURGEON:  Surgeon(s) and Role:    * Leanora Cover, MD - Primary  PHYSICIAN ASSISTANT:   ASSISTANTS: none   ANESTHESIA:   general  EBL:     BLOOD ADMINISTERED:none  DRAINS: none   LOCAL MEDICATIONS USED:  MARCAINE     SPECIMEN:  No Specimen  DISPOSITION OF SPECIMEN:  N/A  COUNTS:  YES  TOURNIQUET:   Total Tourniquet Time Documented: Forearm (Right) - 20 minutes Total: Forearm (Right) - 20 minutes   DICTATION: .Other Dictation: Dictation Number [id number]  PLAN OF CARE: Discharge to home after PACU  PATIENT DISPOSITION:  PACU - hemodynamically stable.

## 2015-03-06 NOTE — Telephone Encounter (Signed)
[name] from [place] [phone]) needs verbal authorization to continue physical therapy at her home for 1x 2 wks &  2x 2wks

## 2015-03-06 NOTE — Telephone Encounter (Signed)
LVM letting [name] know it was ok to do PT per Marya Amsler

## 2015-03-06 NOTE — H&P (Signed)
[name] is an 59 y.o. female.   Chief Complaint: right long finger gangrene HPI: 59 yo female with gangrene right long finger.  Had non healing wound for many months treated with local wound care and then A Cell treatment.  Has had progressive necrosis of end of finger with good demarcation.    Past Medical History  Diagnosis Date  . Diabetes mellitus, type 2   . Secondary hyperparathyroidism   . Ischemic cardiomyopathy     a. EF 25-30% 2011;  b.  Echocardiogram (09/01/2013): EF 30-35%, diffuse hypokinesis, MAC, mild MR, mild LAE, mild RVE, severely reduced RVSF, moderate RAE, mildly increased PASP.  [name] Pulmonary hypertension     Est PAsyst 60-Dr Linard Millers, off coumadin, CT neg for PE  . Hyperlipidemia   . Anemia   . Thyroid disease   . Pulmonary edema   . Peripheral vascular disease     a. Prior LE amputations. b. s/p prior LE bypass surgery.  . Sebaceous cyst   . 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 Richardson Dopp  . Shortness of breath     with exertion  . Hemodialysis patient     M,W,F  . TIA (transient ischemic attack) June 2014  . Ulcer of heel due to diabetes     left  . Atrial fibrillation     a. Dx 08/2013, placed on amiodarone.  . Chronic systolic CHF (congestive heart failure)   . CAD (coronary artery disease)     a. s/p CABG 2011.;  b. Lexiscan Myoview (02/2013): Intermediate risk study; EF 50%, basal inferolateral, basal anterolateral and mid inferolateral moderate ischemia; basal anterior, basal anteroseptal and mid anterior scar;  c. Type 2 NSTEMI during admission for LE revasc surgery 02/2013 in setting of SVT  . Polyneuropathy in diabetes(357.2) 10/31/2013  . Neuropathy   . Headache   . Carpal tunnel syndrome, right   . Kidney disease Dialysis MWF    ESRD secondary to DM, failed transplant 2006. Dialysis M-W-F  . OSA (obstructive sleep apnea)     uses c-papNPSG 11.23.11-AHI 64.9/hr-central and obstructive apnea    Past Surgical History  Procedure  Laterality Date  . Coronary artery bypass graft  2011    3V  . Insertion of dialysis catheter    . Above knee leg amputation Left     infection/gangrene 10/11/07  . Nasal mucosal biopsy    . Ovarian cyst surgery    . 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 Laser retinal surgery  2011  . Kidney transplant      rejection  . Abdominal angiogram Left 01/25/13  . Colonoscopy    . Cardiac catheterization    . Femoral-popliteal bypass graft Left 02/20/2013    Procedure:  FEMORAL-to below knee POPLITEAL ARTERY bypasswith saphenous vein with intraoperative angiogram ;  Surgeon: Angelia Mould, MD;  Location: [place];  Service: Vascular;  Laterality: Left;  fem-below knee pop  . Amputation Left 03/27/2013    Procedure: AMPUTATION ABOVE KNEE;  Surgeon: Angelia Mould, MD;  Location: [place];  Service: Vascular;  Laterality: Left;  . Tee without cardioversion N/A 09/11/2013    Procedure: TRANSESOPHAGEAL ECHOCARDIOGRAM (TEE);  Surgeon: Casandra Doffing, MD;  Location: [facility] ENDOSCOPY;  Service: Cardiovascular;  Laterality: N/A;  . Cardioversion N/A 09/11/2013    Procedure: CARDIOVERSION;  Surgeon: Casandra Doffing, MD;  Location: [facility] ENDOSCOPY;  Service: Cardiovascular;  Laterality: N/A;  . Below knee leg amputation Right   .  Revison of arteriovenous fistula Right 11/21/2013    Procedure: REVISON OF ARTERIOVENOUS FISTULA;  Surgeon: Rosetta Posner, MD;  Location: [place];  Service: Vascular;  Laterality: Right;  . Cataract surgery Right 01-07-2014  . Lower extremity angiogram Left 01/25/2013    Procedure: LOWER EXTREMITY ANGIOGRAM;  Surgeon: Conrad Lockney, MD;  Location: [facility] CATH LAB;  Service: Cardiovascular;  Laterality: Left;  . Colonoscopy with propofol N/A 12/12/2014    Procedure: COLONOSCOPY WITH PROPOFOL;  Surgeon: Laurence Spates, MD;  Location: [place];  Service: Endoscopy;  Laterality: N/A;  . Eye surgery    . Eye surgery Left November 28, 2014    Laser   . I&d extremity  Right 01/30/2015    Procedure: A CELL PLACEMENT, RIGHT LONG FINGER WITH DEBRIDEMENT OF WOUND ;  Surgeon: Leanora Cover, MD;  Location: [place];  Service: Orthopedics;  Laterality: Right;    Family History  Problem Relation Age of Onset  . Heart disease    . Edema Father     fluid overload  . Diabetes Father   . Cerebral aneurysm Mother   . Diabetes Mother   . Stroke Brother   . Heart disease Brother   . Diabetes Brother   . Hyperlipidemia Brother   . Hypertension Brother   . Peripheral vascular disease Brother     amputation   Social History:  reports that she has never smoked. She has quit using smokeless tobacco. Her smokeless tobacco use included Snuff and Chew. She reports that she does not drink alcohol or use illicit drugs.  Allergies:  Allergies  Allergen Reactions  . Neurontin [Gabapentin] Anaphylaxis  . Ezetimibe-Simvastatin Itching and Swelling     facial edema  . Klonopin [Clonazepam] Other (See Comments)    hallucination  . Sertraline Hcl Other (See Comments)    hallucination    No prescriptions prior to admission    No results found for this or any previous visit (from the past 48 hour(s)).  No results found.   A comprehensive review of systems was negative except for: Constitutional: positive for anorexia Eyes: positive for contacts/glasses  Height 4\' 8"  (1.422 m), weight 82.101 kg (181 lb).  General appearance: alert, cooperative and appears stated age Head: Normocephalic, without obvious abnormality, atraumatic Neck: supple, symmetrical, trachea midline Resp: clear to auscultation bilaterally Cardio: regular rate and rhythm GI: non tender Extremities: intact sensation and capillary refill all digits except right long. +epl/fpl/io.  right long with necrosis of distal aspect of digit.  no erythema. Pulses: 2+ and symmetric Skin: Skin color, texture, turgor normal. No rashes or lesions Neurologic: Grossly  normal Incision/Wound: Necrosis of long finger distally  Assessment/Plan Right long finger gangrene.  Plan amputation right long finger.  Non operative and operative treatment options were discussed with the patient and patient wishes to proceed with operative treatment. Risks, benefits, and alternatives of surgery were discussed and the patient agrees with the plan of care.   Lonnie Reth R 03/06/2015, 8:31 AM

## 2015-03-06 NOTE — Op Note (Signed)
NAMEMALLOREY, ODONELL NO.:  [id number]  MEDICAL RECORD NO.:  [id number]  LOCATION:                                 FACILITY:  PHYSICIAN:  Leanora Cover, MD        DATE OF BIRTH:  [birth year]  DATE OF PROCEDURE:  03/06/2015 DATE OF DISCHARGE:                              OPERATIVE REPORT   PREOPERATIVE DIAGNOSIS:  Right long finger gangrene.  POSTOPERATIVE DIAGNOSIS:  Right long finger gangrene.  PROCEDURE:  Right long finger amputation through middle of middle phalanx.  SURGEON:  Leanora Cover, MD.  ASSISTANT:  None.  ANESTHESIA:  General.  IV FLUIDS:  Per anesthesia flow sheet.  ESTIMATED BLOOD LOSS:  Minimal.  COMPLICATIONS:  None.  SPECIMENS:  Fingertip to Pathology for gross examination.  TOURNIQUET TIME:  20 minutes.  DISPOSITION:  Stable to PACU.  INDICATIONS:  Ms. Eaglin is a 59 year old female who has been on dialysis for many years.  She has had a wound on the right long finger that has been nonhealing.  Attempts at treatment of this both locally and with ACell treatment were unsuccessful.  The fingers turned gangrenous at the end.  We discussed amputation of the finger to prevent infection.  Risks, benefits, and alternatives of surgery were discussed including risk of blood loss, infection, damage to nerves, vessels, tendons, ligaments and bone, failure of surgery; need for additional surgery, complications with wound healing, continued pain, continued necrosis in need for further amputation.  Four stitches were selected proceed.  She voiced understanding of these risks and elected to proceed.  OPERATIVE COURSE:  After being identified preoperatively by myself, the patient and I agreed upon procedure and site procedure.  The surgical site was marked.  The risks, benefits, and alternatives of surgery were reviewed and she wished to proceed.  Surgical consent had been signed. She was given IV vancomycin as preoperative antibiotic  prophylaxis.  She was transferred to the operating room and placed on the operating room table in supine position with the right upper extremity on arm board. General anesthesia was induced by Anesthesiology.  Right upper extremity was prepped and draped in normal sterile orthopedic fashion.  Surgical pause performed between surgeons, anesthesia, and operating staff, and all were in agreement as to the patient, procedure and site procedure. The wound was explored.  There was a sharp demarcation.  The skin edges bled at this area.  The tourniquet was inflated to 250 mmHg after exsanguination of the limb with an Esmarch bandage.  A fishmouth type incision was made, taken 1-2 mm of the skin proximal to the demarcation line.  This was carried into subcutaneous tissues.  The radial and ulnar nerve and artery were identified and were treated with bipolar electrocautery.  The finger was removed through the distal phalanx. Rongeurs were then used to take back the middle phalanx until the skin flaps could come over the end of it without any tension.  The wound was copiously irrigated with sterile saline.  The amputation site was closed with 5-0 Monocryl in an interrupted fashion.  There is 1 area of skin at risk on the dorsum of the finger.  The  wound was dressed with sterile Xeroform and 4 x 4 and wrapped with a Coban dressing lightly.  A digital block was performed.  A 10 mL of 0.25% plain Marcaine to aid in postoperative analgesia.  The tourniquet was deflated at 20 minutes. The fingertips were pink with brisk capillary refill after deflation of tourniquet.  The operative drapes were broken down.  The patient was awoken from anesthesia safely.  She was transferred back to the stretcher and taken to PACU in stable condition.  I will see her back in the office in 1 week for postoperative followup.  I will give her Norco 5/325, 1-2 p.o. q.6 hours p.r.n. pain, dispensed #30.     Leanora Cover,  MD     KK/MEDQ  D:  03/06/2015  T:  03/06/2015  Job:  [id number]

## 2015-03-06 NOTE — Anesthesia Postprocedure Evaluation (Signed)
  Anesthesia Post-op Note  Patient: [name]  Procedure(s) Performed: Procedure(s): RIGHT LONG FINGER AMPUTATION  (Right)  Patient Location: PACU  Anesthesia Type: General   Level of Consciousness: awake, alert  and oriented  Airway and Oxygen Therapy: Patient Spontanous Breathing  Post-op Pain: mild  Post-op Assessment: Post-op Vital signs reviewed  Post-op Vital Signs: Reviewed  Last Vitals:  Filed Vitals:   03/06/15 1425  BP: 89/52  Pulse: 104  Temp: 36.4 C  Resp: 20    Complications: No apparent anesthesia complications

## 2015-03-06 NOTE — Discharge Instructions (Addendum)

## 2015-03-06 NOTE — Op Note (Signed)
[id number] 

## 2015-03-06 NOTE — Anesthesia Preprocedure Evaluation (Signed)
Anesthesia Evaluation  Patient identified by MRN, date of birth, ID band Patient awake    Reviewed: Allergy & Precautions, NPO status , Patient's Chart, lab work & pertinent test results  Airway Mallampati: II  TM Distance: >3 FB Neck ROM: Full    Dental  (+) Edentulous Upper, Dental Advisory Given   Pulmonary  breath sounds clear to auscultation        Cardiovascular Rhythm:Irregular Rate:Normal     Neuro/Psych    GI/Hepatic   Endo/Other  diabetes  Renal/GU      Musculoskeletal   Abdominal   Peds  Hematology   Anesthesia Other Findings   Reproductive/Obstetrics                             Anesthesia Physical Anesthesia Plan  ASA: III  Anesthesia Plan: General   Post-op Pain Management:    Induction: Intravenous  Airway Management Planned: LMA  Additional Equipment:   Intra-op Plan:   Post-operative Plan:   Informed Consent: I have reviewed the patients History and Physical, chart, labs and discussed the procedure including the risks, benefits and alternatives for the proposed anesthesia with the patient or authorized representative who has indicated his/her understanding and acceptance.   Dental advisory given  Plan Discussed with: CRNA and Anesthesiologist  Anesthesia Plan Comments: (Gangrene R. Middle finger ESRD on HD last dialysis 6/22 K-3.9 Permanent Afib Type 2 DM glucose 100  Plan GA with LMA  Roberts Gaudy)        Anesthesia Quick Evaluation

## 2015-03-07 ENCOUNTER — Encounter (HOSPITAL_BASED_OUTPATIENT_CLINIC_OR_DEPARTMENT_OTHER): Payer: Self-pay | Admitting: Orthopedic Surgery

## 2015-03-10 ENCOUNTER — Encounter (HOSPITAL_BASED_OUTPATIENT_CLINIC_OR_DEPARTMENT_OTHER): Payer: Medicare Other

## 2015-03-10 ENCOUNTER — Other Ambulatory Visit: Payer: Self-pay

## 2015-03-13 ENCOUNTER — Ambulatory Visit (INDEPENDENT_AMBULATORY_CARE_PROVIDER_SITE_OTHER): Payer: Medicare Other | Admitting: *Deleted

## 2015-03-13 DIAGNOSIS — I4891 Unspecified atrial fibrillation: Secondary | ICD-10-CM

## 2015-03-13 DIAGNOSIS — Z5181 Encounter for therapeutic drug level monitoring: Secondary | ICD-10-CM

## 2015-03-13 DIAGNOSIS — Z7901 Long term (current) use of anticoagulants: Secondary | ICD-10-CM | POA: Diagnosis not present

## 2015-03-13 DIAGNOSIS — I639 Cerebral infarction, unspecified: Secondary | ICD-10-CM

## 2015-03-13 LAB — POCT INR: INR: 5.5

## 2015-03-14 DIAGNOSIS — T148XXA Other injury of unspecified body region, initial encounter: Secondary | ICD-10-CM

## 2015-03-14 DIAGNOSIS — L089 Local infection of the skin and subcutaneous tissue, unspecified: Secondary | ICD-10-CM

## 2015-03-14 HISTORY — DX: Local infection of the skin and subcutaneous tissue, unspecified: L08.9

## 2015-03-14 HISTORY — DX: Other injury of unspecified body region, initial encounter: T14.8XXA

## 2015-03-20 ENCOUNTER — Ambulatory Visit (INDEPENDENT_AMBULATORY_CARE_PROVIDER_SITE_OTHER): Payer: Medicare Other | Admitting: Surgery

## 2015-03-20 ENCOUNTER — Other Ambulatory Visit (HOSPITAL_BASED_OUTPATIENT_CLINIC_OR_DEPARTMENT_OTHER): Payer: Medicare Other

## 2015-03-20 DIAGNOSIS — I639 Cerebral infarction, unspecified: Secondary | ICD-10-CM | POA: Diagnosis not present

## 2015-03-20 DIAGNOSIS — Z7901 Long term (current) use of anticoagulants: Secondary | ICD-10-CM | POA: Diagnosis not present

## 2015-03-20 DIAGNOSIS — Z5181 Encounter for therapeutic drug level monitoring: Secondary | ICD-10-CM

## 2015-03-20 DIAGNOSIS — D472 Monoclonal gammopathy: Secondary | ICD-10-CM

## 2015-03-20 DIAGNOSIS — I4891 Unspecified atrial fibrillation: Secondary | ICD-10-CM

## 2015-03-20 DIAGNOSIS — N186 End stage renal disease: Secondary | ICD-10-CM

## 2015-03-20 LAB — CBC WITH DIFFERENTIAL/PLATELET
BASO%: 0.5 % (ref 0.0–2.0)
Basophils Absolute: 0.1 10*3/uL (ref 0.0–0.1)
EOS%: 0.7 % (ref 0.0–7.0)
Eosinophils Absolute: 0.1 10*3/uL (ref 0.0–0.5)
HCT: 42.8 % (ref 34.8–46.6)
HGB: 13.7 g/dL (ref 11.6–15.9)
LYMPH%: 11.1 % — AB (ref 14.0–49.7)
MCH: 27.8 pg (ref 25.1–34.0)
MCHC: 32 g/dL (ref 31.5–36.0)
MCV: 87 fL (ref 79.5–101.0)
MONO#: 1.2 10*3/uL — ABNORMAL HIGH (ref 0.1–0.9)
MONO%: 11.8 % (ref 0.0–14.0)
NEUT#: 7.5 10*3/uL — ABNORMAL HIGH (ref 1.5–6.5)
NEUT%: 75.9 % (ref 38.4–76.8)
Platelets: 164 10*3/uL (ref 145–400)
RBC: 4.92 10*6/uL (ref 3.70–5.45)
RDW: 16.2 % — ABNORMAL HIGH (ref 11.2–14.5)
WBC: 9.9 10*3/uL (ref 3.9–10.3)
lymph#: 1.1 10*3/uL (ref 0.9–3.3)

## 2015-03-20 LAB — COMPREHENSIVE METABOLIC PANEL (CC13)
ALK PHOS: 126 U/L (ref 40–150)
ALT: 13 U/L (ref 0–55)
AST: 22 U/L (ref 5–34)
Albumin: 1.6 g/dL — ABNORMAL LOW (ref 3.5–5.0)
Anion Gap: 10 mEq/L (ref 3–11)
BUN: 19.7 mg/dL (ref 7.0–26.0)
CALCIUM: 7.6 mg/dL — AB (ref 8.4–10.4)
CHLORIDE: 96 meq/L — AB (ref 98–109)
CO2: 29 mEq/L (ref 22–29)
Creatinine: 3.1 mg/dL (ref 0.6–1.1)
EGFR: 18 mL/min/{1.73_m2} — ABNORMAL LOW (ref >90)
Glucose: 92 mg/dl (ref 70–140)
POTASSIUM: 4.3 meq/L (ref 3.5–5.1)
Sodium: 135 mEq/L — ABNORMAL LOW (ref 136–145)
TOTAL PROTEIN: 5.9 g/dL — AB (ref 6.4–8.3)
Total Bilirubin: 0.73 mg/dL (ref 0.20–1.20)

## 2015-03-20 LAB — LACTATE DEHYDROGENASE (CC13): LDH: 190 U/L (ref 125–245)

## 2015-03-20 LAB — POCT INR: INR: 2.8

## 2015-03-24 ENCOUNTER — Telehealth: Payer: Self-pay | Admitting: Family

## 2015-03-24 LAB — IGG, IGA, IGM
IgA: 154 mg/dL (ref 69–380)
IgG (Immunoglobin G), Serum: 2460 mg/dL — ABNORMAL HIGH (ref 690–1700)
IgM, Serum: 18 mg/dL — ABNORMAL LOW (ref 52–322)

## 2015-03-24 LAB — BETA 2 MICROGLOBULIN, SERUM: Beta-2 Microglobulin: 33.3 mg/L — ABNORMAL HIGH (ref <=2.51)

## 2015-03-24 LAB — KAPPA/LAMBDA LIGHT CHAINS
Kappa free light chain: 57.6 mg/dL — ABNORMAL HIGH (ref 0.33–1.94)
Kappa:Lambda Ratio: 3.39 — ABNORMAL HIGH (ref 0.26–1.65)
Lambda Free Lght Chn: 17 mg/dL — ABNORMAL HIGH (ref 0.57–2.63)

## 2015-03-24 NOTE — Telephone Encounter (Signed)
[name], home health number [phone]   Increase skilled nursing to 2 times  Week

## 2015-03-25 NOTE — Telephone Encounter (Signed)
LVM stating that [name] was ok with increasing skilled nursing 2 times a week

## 2015-03-26 ENCOUNTER — Telehealth: Payer: Self-pay | Admitting: Family

## 2015-03-26 NOTE — Telephone Encounter (Signed)
Kim from [facility] needs verbal orders to continue her physical therapy 3 additional weeks

## 2015-03-27 ENCOUNTER — Telehealth: Payer: Self-pay | Admitting: Family

## 2015-03-27 ENCOUNTER — Telehealth: Payer: Self-pay | Admitting: Internal Medicine

## 2015-03-27 ENCOUNTER — Encounter: Payer: Self-pay | Admitting: Internal Medicine

## 2015-03-27 ENCOUNTER — Ambulatory Visit (HOSPITAL_BASED_OUTPATIENT_CLINIC_OR_DEPARTMENT_OTHER): Payer: Medicare Other | Admitting: Internal Medicine

## 2015-03-27 VITALS — BP 128/114 | HR 100 | Temp 98.6°F | Resp 17 | Ht <= 58 in

## 2015-03-27 DIAGNOSIS — N186 End stage renal disease: Secondary | ICD-10-CM | POA: Diagnosis not present

## 2015-03-27 DIAGNOSIS — D472 Monoclonal gammopathy: Secondary | ICD-10-CM | POA: Diagnosis not present

## 2015-03-27 DIAGNOSIS — Z992 Dependence on renal dialysis: Secondary | ICD-10-CM | POA: Diagnosis not present

## 2015-03-27 NOTE — Telephone Encounter (Signed)
LVM letting Maudie Mercury know it was ok per Marya Amsler.

## 2015-03-27 NOTE — Telephone Encounter (Signed)
Is requesting order for social work consult, b/c patient is having multipal falls and difficulty getting around and bathing.

## 2015-03-27 NOTE — Progress Notes (Signed)
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 2) End-stage renal disease currently on hemodialysis on Monday, Wednesday and Friday under the care of Dr. Jimmy Footman.  PRIOR THERAPY: None  CURRENT THERAPY: Observation.  INTERVAL HISTORY: [name] 59 y.o. female returns to the clinic today for six-month follow-up visit. She was diagnosed with monoclonal gammopathy of undetermined significance in November 2013 and has been observation since that time. The patient is feeling fine today was no specific complaints. She is tolerating her hemodialysis fairly well. She denied having any significant weight loss or night sweats. She denied having any chest pain, shortness of breath, cough or hemoptysis. She has no significant nausea or vomiting. She had repeat myeloma panel performed earlier today and she is here for evaluation and discussion of her lab results and recommendation regarding her condition.  MEDICAL HISTORY: Past Medical History  Diagnosis Date  . Diabetes mellitus, type 2   . Secondary hyperparathyroidism   . Ischemic cardiomyopathy     a. EF 25-30% 2011;  b.  Echocardiogram (09/01/2013): EF 30-35%, diffuse hypokinesis, MAC, mild MR, mild LAE, mild RVE, severely reduced RVSF, moderate RAE, mildly increased PASP.  [name] Pulmonary hypertension     Est PAsyst 60-Dr Linard Millers, off coumadin, CT neg for PE  . Hyperlipidemia   . Anemia   . Thyroid disease   . Pulmonary edema   . Peripheral vascular disease     a. Prior LE amputations. b. s/p prior LE bypass surgery.  . Sebaceous cyst   . 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 Richardson Dopp  . Shortness of breath     with exertion  . Hemodialysis patient     M,W,F  . TIA (transient ischemic attack) June 2014  . Ulcer of heel  due to diabetes     left  . Atrial fibrillation     a. Dx 08/2013, placed on amiodarone.  . Chronic systolic CHF (congestive heart failure)   . CAD (coronary artery disease)     a. s/p CABG 2011.;  b. Lexiscan Myoview (02/2013): Intermediate risk study; EF 50%, basal inferolateral, basal anterolateral and mid inferolateral moderate ischemia; basal anterior, basal anteroseptal and mid anterior scar;  c. Type 2 NSTEMI during admission for LE revasc surgery 02/2013 in setting of SVT  . Polyneuropathy in diabetes(357.2) 10/31/2013  . Neuropathy   . Headache   . Carpal tunnel syndrome, right   . Kidney disease Dialysis MWF    ESRD secondary to DM, failed transplant 2006. Dialysis M-W-F  . OSA (obstructive sleep apnea)     uses c-papNPSG 11.23.11-AHI 64.9/hr-central and obstructive apnea    ALLERGIES:  is allergic to neurontin; ezetimibe-simvastatin; klonopin; and sertraline hcl.  MEDICATIONS:  Current Outpatient Prescriptions  Medication Sig Dispense Refill  . amiodarone (PACERONE) 200 MG tablet Take 1 tablet (200 mg total) by mouth daily. (Patient taking differently: Take 100 mg by mouth daily. ) 30 tablet 11  . aspirin EC 81 MG tablet Take 81 mg by mouth daily.    [name] atorvastatin (LIPITOR) 10 MG tablet Take 10 mg by mouth daily at 2 PM daily at 2 PM.     . B Complex-C (SUPER B COMPLEX PO) Take 1 tablet by mouth daily at 2 PM daily at 2 PM.    . B  Complex-C-Folic Acid (DIALYVITE TABLET) TABS Take 1 tablet by mouth daily at 2 PM daily at 2 PM.   3  . cinacalcet (SENSIPAR) 60 MG tablet Take 60 mg by mouth daily at 2 PM daily at 2 PM.     . diphenoxylate-atropine (LOMOTIL) 2.5-0.025 MG per tablet Take by mouth 4 (four) times daily as needed for diarrhea or loose stools.    [name] HYDROcodone-acetaminophen (NORCO) 5-325 MG per tablet 1-2 tabs po q6 hours prn pain 30 tablet 0  . hydrOXYzine (ATARAX/VISTARIL) 25 MG tablet Take 25 mg by mouth every 6 (six) hours as needed for itching.     . insulin aspart  (NOVOLOG FLEXPEN) 100 UNIT/ML FlexPen Inject 2-15 Units into the skin 3 (three) times daily with meals. Per sliding scale    . midodrine (PROAMATINE) 10 MG tablet Take 10 mg by mouth daily at 2 PM daily at 2 PM.     . pregabalin (LYRICA) 25 MG capsule Take 25 mg by mouth daily at 2 PM daily at 2 PM.     . Propylene Glycol 0.6 % SOLN Place 1 drop into the right eye 3 (three) times daily as needed (dry eyes).    . sevelamer carbonate (RENVELA) 800 MG tablet Take 1,600 mg by mouth daily with lunch.     . warfarin (COUMADIN) 4 MG tablet 4 mg. Take One and a half tablet Mon-Wed-Frid  And  Take one tablet Sun-Tue-Thur- Sat.     No current facility-administered medications for this visit.    SURGICAL HISTORY:  Past Surgical History  Procedure Laterality Date  . Coronary artery bypass graft  2011    3V  . Insertion of dialysis catheter    . Above knee leg amputation Left     infection/gangrene 10/11/07  . Nasal mucosal biopsy    . Ovarian cyst surgery    . 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 Laser retinal surgery  2011  . Kidney transplant      rejection  . Abdominal angiogram Left 01/25/13  . Colonoscopy    . Cardiac catheterization    . Femoral-popliteal bypass graft Left 02/20/2013    Procedure:  FEMORAL-to below knee POPLITEAL ARTERY bypasswith saphenous vein with intraoperative angiogram ;  Surgeon: Angelia Mould, MD;  Location: [facility];  Service: Vascular;  Laterality: Left;  fem-below knee pop  . Amputation Left 03/27/2013    Procedure: AMPUTATION ABOVE KNEE;  Surgeon: Angelia Mould, MD;  Location: [facility];  Service: Vascular;  Laterality: Left;  . Tee without cardioversion N/A 09/11/2013    Procedure: TRANSESOPHAGEAL ECHOCARDIOGRAM (TEE);  Surgeon: Casandra Doffing, MD;  Location: [facility] ENDOSCOPY;  Service: Cardiovascular;  Laterality: N/A;  . Cardioversion N/A 09/11/2013    Procedure: CARDIOVERSION;  Surgeon: Casandra Doffing, MD;  Location: [facility]  ENDOSCOPY;  Service: Cardiovascular;  Laterality: N/A;  . Below knee leg amputation Right   . Revison of arteriovenous fistula Right 11/21/2013    Procedure: REVISON OF ARTERIOVENOUS FISTULA;  Surgeon: Rosetta Posner, MD;  Location: [facility];  Service: Vascular;  Laterality: Right;  . Cataract surgery Right 01-07-2014  . Lower extremity angiogram Left 01/25/2013    Procedure: LOWER EXTREMITY ANGIOGRAM;  Surgeon: Conrad Clarksville, MD;  Location: [facility] CATH LAB;  Service: Cardiovascular;  Laterality: Left;  . Colonoscopy with propofol N/A 12/12/2014    Procedure: COLONOSCOPY WITH PROPOFOL;  Surgeon: Laurence Spates, MD;  Location: [facility];  Service: Endoscopy;  Laterality: N/A;  .  Eye surgery    . Eye surgery Left November 28, 2014    Laser   . I&d extremity Right 01/30/2015    Procedure: A CELL PLACEMENT, RIGHT LONG FINGER WITH DEBRIDEMENT OF WOUND ;  Surgeon: Leanora Cover, MD;  Location: [facility];  Service: Orthopedics;  Laterality: Right;  . Amputation Right 03/06/2015    Procedure: RIGHT LONG FINGER AMPUTATION ;  Surgeon: Leanora Cover, MD;  Location: [facility];  Service: Orthopedics;  Laterality: Right;    REVIEW OF SYSTEMS:  Constitutional: negative Eyes: negative Ears, nose, mouth, throat, and face: negative Respiratory: negative Cardiovascular: negative Gastrointestinal: negative Genitourinary:negative Integument/breast: negative Hematologic/lymphatic: negative Musculoskeletal:negative Neurological: negative Behavioral/Psych: negative Endocrine: negative Allergic/Immunologic: negative   PHYSICAL EXAMINATION: General appearance: alert, cooperative and no distress Head: Normocephalic, without obvious abnormality, atraumatic Neck: no adenopathy, no JVD, supple, symmetrical, trachea midline and thyroid not enlarged, symmetric, no tenderness/mass/nodules Lymph nodes: Cervical, supraclavicular, and axillary nodes normal. Resp: clear to auscultation bilaterally Back:  symmetric, no curvature. ROM normal. No CVA tenderness. Cardio: regular rate and rhythm, S1, S2 normal, no murmur, click, rub or gallop GI: soft, non-tender; bowel sounds normal; no masses,  no organomegaly Extremities: Above-knee amputation Neurologic: Alert and oriented X 3, normal strength and tone. Normal symmetric reflexes. Normal coordination and gait  ECOG PERFORMANCE STATUS: 1 - Symptomatic but completely ambulatory  Blood pressure 128/114, pulse 100, temperature 98.6 F (37 C), temperature source Oral, resp. rate 17, height 4\' 8"  (1.422 m).  LABORATORY DATA: Lab Results  Component Value Date   WBC 9.9 03/20/2015   HGB 13.7 03/20/2015   HCT 42.8 03/20/2015   MCV 87.0 03/20/2015   PLT 164 03/20/2015      Chemistry      Component Value Date/Time   NA 135* 03/20/2015 0852   NA 133* 03/06/2015 1050   K 4.3 03/20/2015 0852   K 3.9 03/06/2015 1050   CL 95* 03/06/2015 1050   CL 98 07/11/2012 1427   CO2 29 03/20/2015 0852   CO2 30 01/29/2015 1210   BUN 19.7 03/20/2015 0852   BUN 24* 03/06/2015 1050   CREATININE 3.1* 03/20/2015 0852   CREATININE 3.40* 03/06/2015 1050      Component Value Date/Time   CALCIUM 7.6* 03/20/2015 0852   CALCIUM 8.0* 01/29/2015 1210   ALKPHOS 126 03/20/2015 0852   ALKPHOS 96 08/07/2014 1538   AST 22 03/20/2015 0852   AST 32 08/07/2014 1538   ALT 13 03/20/2015 0852   ALT 25 08/07/2014 1538   BILITOT 0.73 03/20/2015 0852   BILITOT 0.4 08/07/2014 1538       RADIOGRAPHIC STUDIES: No results found.  ASSESSMENT AND PLAN: This is a very pleasant 59 years old African-American female with multiple medical problems and was seen in the clinic by the 2 previous oncologist for monoclonal gammopathy of undetermined significance. The patient had significantly elevated free Kappa light chain as well as free lambda light chain on the previous bloodwork. Her myeloma panel showed persistent elevation of the free kappa light chain as well as mild increase  in the IgG but consider reasonably stable compared to the previous bloodwork 6 months ago. I discussed the lab result with the patient today. I recommended for the patient to continue on observation for now with repeat myeloma panel in 3 months. For the end-stage renal disease, she will continue on hemodialysis as a scheduled. I recommended for the patient to call immediately if she has any concerning symptoms in the  interval. The patient voices understanding of current disease status and treatment options and is in agreement with the current care plan.  All questions were answered. The patient knows to call the clinic with any problems, questions or concerns. We can certainly see the patient much sooner if necessary.  Disclaimer: This note was dictated with voice recognition software. Similar sounding words can inadvertently be transcribed and may not be corrected upon review.

## 2015-03-27 NOTE — Telephone Encounter (Signed)
per pof to sch pt appt-gave pt copy of avs °

## 2015-03-28 NOTE — Telephone Encounter (Signed)
Please contact home health services for verbal order.

## 2015-03-28 NOTE — Telephone Encounter (Signed)
Called them back and LVM asking what we need to do

## 2015-03-31 ENCOUNTER — Encounter (HOSPITAL_COMMUNITY): Payer: Self-pay | Admitting: Emergency Medicine

## 2015-03-31 ENCOUNTER — Emergency Department (HOSPITAL_COMMUNITY): Payer: Medicare Other

## 2015-03-31 ENCOUNTER — Inpatient Hospital Stay (HOSPITAL_COMMUNITY)
Admission: EM | Admit: 2015-03-31 | Discharge: 2015-04-14 | DRG: 870 | Disposition: E | Payer: Medicare Other | Attending: Pulmonary Disease | Admitting: Pulmonary Disease

## 2015-03-31 DIAGNOSIS — J9690 Respiratory failure, unspecified, unspecified whether with hypoxia or hypercapnia: Secondary | ICD-10-CM

## 2015-03-31 DIAGNOSIS — S31809D Unspecified open wound of unspecified buttock, subsequent encounter: Secondary | ICD-10-CM | POA: Diagnosis present

## 2015-03-31 DIAGNOSIS — Z794 Long term (current) use of insulin: Secondary | ICD-10-CM

## 2015-03-31 DIAGNOSIS — E785 Hyperlipidemia, unspecified: Secondary | ICD-10-CM | POA: Diagnosis present

## 2015-03-31 DIAGNOSIS — I120 Hypertensive chronic kidney disease with stage 5 chronic kidney disease or end stage renal disease: Secondary | ICD-10-CM | POA: Diagnosis present

## 2015-03-31 DIAGNOSIS — Z978 Presence of other specified devices: Secondary | ICD-10-CM | POA: Insufficient documentation

## 2015-03-31 DIAGNOSIS — Z951 Presence of aortocoronary bypass graft: Secondary | ICD-10-CM | POA: Diagnosis not present

## 2015-03-31 DIAGNOSIS — D696 Thrombocytopenia, unspecified: Secondary | ICD-10-CM | POA: Diagnosis present

## 2015-03-31 DIAGNOSIS — Y95 Nosocomial condition: Secondary | ICD-10-CM | POA: Diagnosis not present

## 2015-03-31 DIAGNOSIS — E872 Acidosis: Secondary | ICD-10-CM | POA: Diagnosis not present

## 2015-03-31 DIAGNOSIS — J189 Pneumonia, unspecified organism: Secondary | ICD-10-CM | POA: Diagnosis not present

## 2015-03-31 DIAGNOSIS — L8990 Pressure ulcer of unspecified site, unspecified stage: Secondary | ICD-10-CM | POA: Insufficient documentation

## 2015-03-31 DIAGNOSIS — I4891 Unspecified atrial fibrillation: Secondary | ICD-10-CM | POA: Diagnosis present

## 2015-03-31 DIAGNOSIS — R569 Unspecified convulsions: Secondary | ICD-10-CM | POA: Diagnosis not present

## 2015-03-31 DIAGNOSIS — T798XXA Other early complications of trauma, initial encounter: Secondary | ICD-10-CM | POA: Diagnosis not present

## 2015-03-31 DIAGNOSIS — I4901 Ventricular fibrillation: Secondary | ICD-10-CM | POA: Diagnosis not present

## 2015-03-31 DIAGNOSIS — S78119A Complete traumatic amputation at level between unspecified hip and knee, initial encounter: Secondary | ICD-10-CM

## 2015-03-31 DIAGNOSIS — E871 Hypo-osmolality and hyponatremia: Secondary | ICD-10-CM | POA: Diagnosis not present

## 2015-03-31 DIAGNOSIS — R6521 Severe sepsis with septic shock: Secondary | ICD-10-CM | POA: Diagnosis present

## 2015-03-31 DIAGNOSIS — Z9911 Dependence on respirator [ventilator] status: Secondary | ICD-10-CM | POA: Insufficient documentation

## 2015-03-31 DIAGNOSIS — Z89612 Acquired absence of left leg above knee: Secondary | ICD-10-CM

## 2015-03-31 DIAGNOSIS — I255 Ischemic cardiomyopathy: Secondary | ICD-10-CM | POA: Diagnosis present

## 2015-03-31 DIAGNOSIS — N186 End stage renal disease: Secondary | ICD-10-CM | POA: Diagnosis present

## 2015-03-31 DIAGNOSIS — L089 Local infection of the skin and subcutaneous tissue, unspecified: Secondary | ICD-10-CM | POA: Diagnosis present

## 2015-03-31 DIAGNOSIS — Z992 Dependence on renal dialysis: Secondary | ICD-10-CM | POA: Diagnosis not present

## 2015-03-31 DIAGNOSIS — J9601 Acute respiratory failure with hypoxia: Secondary | ICD-10-CM | POA: Diagnosis not present

## 2015-03-31 DIAGNOSIS — D6862 Lupus anticoagulant syndrome: Secondary | ICD-10-CM | POA: Diagnosis present

## 2015-03-31 DIAGNOSIS — E46 Unspecified protein-calorie malnutrition: Secondary | ICD-10-CM | POA: Diagnosis present

## 2015-03-31 DIAGNOSIS — E1142 Type 2 diabetes mellitus with diabetic polyneuropathy: Secondary | ICD-10-CM | POA: Diagnosis present

## 2015-03-31 DIAGNOSIS — G4089 Other seizures: Secondary | ICD-10-CM | POA: Diagnosis not present

## 2015-03-31 DIAGNOSIS — J9600 Acute respiratory failure, unspecified whether with hypoxia or hypercapnia: Secondary | ICD-10-CM | POA: Insufficient documentation

## 2015-03-31 DIAGNOSIS — G5601 Carpal tunnel syndrome, right upper limb: Secondary | ICD-10-CM | POA: Diagnosis present

## 2015-03-31 DIAGNOSIS — L89322 Pressure ulcer of left buttock, stage 2: Secondary | ICD-10-CM | POA: Diagnosis present

## 2015-03-31 DIAGNOSIS — D649 Anemia, unspecified: Secondary | ICD-10-CM | POA: Diagnosis present

## 2015-03-31 DIAGNOSIS — I739 Peripheral vascular disease, unspecified: Secondary | ICD-10-CM | POA: Diagnosis present

## 2015-03-31 DIAGNOSIS — I469 Cardiac arrest, cause unspecified: Secondary | ICD-10-CM | POA: Insufficient documentation

## 2015-03-31 DIAGNOSIS — D72829 Elevated white blood cell count, unspecified: Secondary | ICD-10-CM | POA: Diagnosis present

## 2015-03-31 DIAGNOSIS — Z6841 Body Mass Index (BMI) 40.0 and over, adult: Secondary | ICD-10-CM

## 2015-03-31 DIAGNOSIS — G4733 Obstructive sleep apnea (adult) (pediatric): Secondary | ICD-10-CM | POA: Diagnosis present

## 2015-03-31 DIAGNOSIS — Z7901 Long term (current) use of anticoagulants: Secondary | ICD-10-CM

## 2015-03-31 DIAGNOSIS — E1122 Type 2 diabetes mellitus with diabetic chronic kidney disease: Secondary | ICD-10-CM | POA: Diagnosis present

## 2015-03-31 DIAGNOSIS — N2581 Secondary hyperparathyroidism of renal origin: Secondary | ICD-10-CM | POA: Diagnosis present

## 2015-03-31 DIAGNOSIS — L24A9 Irritant contact dermatitis due friction or contact with other specified body fluids: Secondary | ICD-10-CM

## 2015-03-31 DIAGNOSIS — I462 Cardiac arrest due to underlying cardiac condition: Secondary | ICD-10-CM | POA: Diagnosis not present

## 2015-03-31 DIAGNOSIS — Z7982 Long term (current) use of aspirin: Secondary | ICD-10-CM

## 2015-03-31 DIAGNOSIS — Z66 Do not resuscitate: Secondary | ICD-10-CM | POA: Diagnosis present

## 2015-03-31 DIAGNOSIS — Z4659 Encounter for fitting and adjustment of other gastrointestinal appliance and device: Secondary | ICD-10-CM | POA: Diagnosis not present

## 2015-03-31 DIAGNOSIS — I2510 Atherosclerotic heart disease of native coronary artery without angina pectoris: Secondary | ICD-10-CM | POA: Diagnosis present

## 2015-03-31 DIAGNOSIS — Z89511 Acquired absence of right leg below knee: Secondary | ICD-10-CM

## 2015-03-31 DIAGNOSIS — G931 Anoxic brain damage, not elsewhere classified: Secondary | ICD-10-CM | POA: Diagnosis not present

## 2015-03-31 DIAGNOSIS — I9589 Other hypotension: Secondary | ICD-10-CM | POA: Diagnosis present

## 2015-03-31 DIAGNOSIS — T45515A Adverse effect of anticoagulants, initial encounter: Secondary | ICD-10-CM | POA: Diagnosis present

## 2015-03-31 DIAGNOSIS — A419 Sepsis, unspecified organism: Principal | ICD-10-CM | POA: Diagnosis present

## 2015-03-31 DIAGNOSIS — L89153 Pressure ulcer of sacral region, stage 3: Secondary | ICD-10-CM | POA: Diagnosis present

## 2015-03-31 DIAGNOSIS — G9340 Encephalopathy, unspecified: Secondary | ICD-10-CM | POA: Diagnosis present

## 2015-03-31 DIAGNOSIS — I472 Ventricular tachycardia: Secondary | ICD-10-CM | POA: Diagnosis not present

## 2015-03-31 DIAGNOSIS — I5042 Chronic combined systolic (congestive) and diastolic (congestive) heart failure: Secondary | ICD-10-CM | POA: Diagnosis present

## 2015-03-31 DIAGNOSIS — R791 Abnormal coagulation profile: Secondary | ICD-10-CM | POA: Diagnosis present

## 2015-03-31 DIAGNOSIS — Z8673 Personal history of transient ischemic attack (TIA), and cerebral infarction without residual deficits: Secondary | ICD-10-CM

## 2015-03-31 DIAGNOSIS — I252 Old myocardial infarction: Secondary | ICD-10-CM | POA: Diagnosis not present

## 2015-03-31 DIAGNOSIS — J811 Chronic pulmonary edema: Secondary | ICD-10-CM | POA: Insufficient documentation

## 2015-03-31 DIAGNOSIS — R4020 Unspecified coma: Secondary | ICD-10-CM | POA: Diagnosis not present

## 2015-03-31 DIAGNOSIS — I480 Paroxysmal atrial fibrillation: Secondary | ICD-10-CM | POA: Diagnosis present

## 2015-03-31 DIAGNOSIS — E039 Hypothyroidism, unspecified: Secondary | ICD-10-CM | POA: Diagnosis present

## 2015-03-31 DIAGNOSIS — I2781 Cor pulmonale (chronic): Secondary | ICD-10-CM | POA: Diagnosis not present

## 2015-03-31 DIAGNOSIS — Z515 Encounter for palliative care: Secondary | ICD-10-CM

## 2015-03-31 DIAGNOSIS — T148XXA Other injury of unspecified body region, initial encounter: Secondary | ICD-10-CM

## 2015-03-31 DIAGNOSIS — I272 Other secondary pulmonary hypertension: Secondary | ICD-10-CM | POA: Diagnosis present

## 2015-03-31 DIAGNOSIS — R7889 Finding of other specified substances, not normally found in blood: Secondary | ICD-10-CM | POA: Diagnosis not present

## 2015-03-31 DIAGNOSIS — L89312 Pressure ulcer of right buttock, stage 2: Secondary | ICD-10-CM | POA: Diagnosis present

## 2015-03-31 DIAGNOSIS — E11649 Type 2 diabetes mellitus with hypoglycemia without coma: Secondary | ICD-10-CM | POA: Diagnosis present

## 2015-03-31 DIAGNOSIS — T798XXS Other early complications of trauma, sequela: Secondary | ICD-10-CM | POA: Diagnosis not present

## 2015-03-31 DIAGNOSIS — I509 Heart failure, unspecified: Secondary | ICD-10-CM | POA: Diagnosis not present

## 2015-03-31 HISTORY — DX: Local infection of the skin and subcutaneous tissue, unspecified: L08.9

## 2015-03-31 HISTORY — DX: Other injury of unspecified body region, initial encounter: T14.8XXA

## 2015-03-31 LAB — CBC WITH DIFFERENTIAL/PLATELET
BASOS PCT: 0 % (ref 0–1)
Basophils Absolute: 0 10*3/uL (ref 0.0–0.1)
EOS ABS: 0 10*3/uL (ref 0.0–0.7)
EOS PCT: 0 % (ref 0–5)
HCT: 43.2 % (ref 36.0–46.0)
Hemoglobin: 14.3 g/dL (ref 12.0–15.0)
LYMPHS PCT: 7 % — AB (ref 12–46)
Lymphs Abs: 1.3 10*3/uL (ref 0.7–4.0)
MCH: 27.9 pg (ref 26.0–34.0)
MCHC: 33.1 g/dL (ref 30.0–36.0)
MCV: 84.2 fL (ref 78.0–100.0)
Monocytes Absolute: 1.3 10*3/uL — ABNORMAL HIGH (ref 0.1–1.0)
Monocytes Relative: 7 % (ref 3–12)
Neutro Abs: 15.9 10*3/uL — ABNORMAL HIGH (ref 1.7–7.7)
Neutrophils Relative %: 86 % — ABNORMAL HIGH (ref 43–77)
Platelets: 137 10*3/uL — ABNORMAL LOW (ref 150–400)
RBC: 5.13 MIL/uL — ABNORMAL HIGH (ref 3.87–5.11)
RDW: 16 % — ABNORMAL HIGH (ref 11.5–15.5)
WBC: 18.5 10*3/uL — ABNORMAL HIGH (ref 4.0–10.5)

## 2015-03-31 LAB — GLUCOSE, CAPILLARY
GLUCOSE-CAPILLARY: 50 mg/dL — AB (ref 65–99)
Glucose-Capillary: 76 mg/dL (ref 65–99)

## 2015-03-31 LAB — BASIC METABOLIC PANEL
Anion gap: 12 (ref 5–15)
BUN: 31 mg/dL — ABNORMAL HIGH (ref 6–20)
CALCIUM: 8 mg/dL — AB (ref 8.9–10.3)
CO2: 23 mmol/L (ref 22–32)
CREATININE: 4.17 mg/dL — AB (ref 0.44–1.00)
Chloride: 95 mmol/L — ABNORMAL LOW (ref 101–111)
GFR calc non Af Amer: 11 mL/min — ABNORMAL LOW (ref >60)
GFR, EST AFRICAN AMERICAN: 12 mL/min — AB (ref >60)
Glucose, Bld: 61 mg/dL — ABNORMAL LOW (ref 65–99)
Potassium: 4.7 mmol/L (ref 3.5–5.1)
SODIUM: 130 mmol/L — AB (ref 135–145)

## 2015-03-31 LAB — I-STAT CG4 LACTIC ACID, ED: Lactic Acid, Venous: 2.35 mmol/L (ref 0.5–2.0)

## 2015-03-31 LAB — CBG MONITORING, ED: Glucose-Capillary: 62 mg/dL — ABNORMAL LOW (ref 65–99)

## 2015-03-31 LAB — TROPONIN I: Troponin I: 0.05 ng/mL — ABNORMAL HIGH (ref <0.031)

## 2015-03-31 LAB — I-STAT TROPONIN, ED: TROPONIN I, POC: 0.07 ng/mL (ref 0.00–0.08)

## 2015-03-31 LAB — PROTIME-INR
INR: 6.97 (ref 0.00–1.49)
Prothrombin Time: 57.7 seconds — ABNORMAL HIGH (ref 11.6–15.2)

## 2015-03-31 LAB — MRSA PCR SCREENING: MRSA by PCR: NEGATIVE

## 2015-03-31 MED ORDER — PIPERACILLIN-TAZOBACTAM IN DEX 2-0.25 GM/50ML IV SOLN
2.2500 g | Freq: Once | INTRAVENOUS | Status: AC
  Administered 2015-03-31: 2.25 g via INTRAVENOUS
  Filled 2015-03-31: qty 50

## 2015-03-31 MED ORDER — OXYCODONE-ACETAMINOPHEN 5-325 MG PO TABS
ORAL_TABLET | ORAL | Status: AC
  Filled 2015-03-31: qty 1

## 2015-03-31 MED ORDER — MIDODRINE HCL 5 MG PO TABS
ORAL_TABLET | ORAL | Status: AC
  Filled 2015-03-31: qty 2

## 2015-03-31 MED ORDER — DIPHENOXYLATE-ATROPINE 2.5-0.025 MG PO TABS: 1.0000 | ORAL_TABLET | Freq: Four times a day (QID) | ORAL | Status: DC | PRN

## 2015-03-31 MED ORDER — CINACALCET HCL 30 MG PO TABS
60.0000 mg | ORAL_TABLET | Freq: Every day | ORAL | Status: DC
  Administered 2015-04-01 – 2015-04-03 (×3): 60 mg via ORAL
  Filled 2015-03-31 (×7): qty 2

## 2015-03-31 MED ORDER — VANCOMYCIN HCL 10 G IV SOLR
1250.0000 mg | Freq: Once | INTRAVENOUS | Status: AC
  Administered 2015-03-31: 1250 mg via INTRAVENOUS
  Filled 2015-03-31: qty 1250

## 2015-03-31 MED ORDER — SODIUM CHLORIDE 0.9 % IJ SOLN
3.0000 mL | Freq: Two times a day (BID) | INTRAMUSCULAR | Status: DC
  Administered 2015-03-31 – 2015-04-07 (×13): 3 mL via INTRAVENOUS
  Administered 2015-04-08 – 2015-04-09 (×2): 6 mL via INTRAVENOUS
  Administered 2015-04-09 – 2015-04-11 (×4): 3 mL via INTRAVENOUS

## 2015-03-31 MED ORDER — ONDANSETRON HCL 4 MG PO TABS: 4.0000 mg | ORAL_TABLET | Freq: Four times a day (QID) | ORAL | Status: DC | PRN

## 2015-03-31 MED ORDER — LIDOCAINE HCL (PF) 1 % IJ SOLN: 5.0000 mL | INTRAMUSCULAR | Status: DC | PRN

## 2015-03-31 MED ORDER — LIDOCAINE-PRILOCAINE 2.5-2.5 % EX CREA: 1.0000 "application " | TOPICAL_CREAM | CUTANEOUS | Status: DC | PRN

## 2015-03-31 MED ORDER — OXYCODONE-ACETAMINOPHEN 5-325 MG PO TABS
1.0000 | ORAL_TABLET | ORAL | Status: DC | PRN
  Administered 2015-03-31 – 2015-04-01 (×2): 1 via ORAL
  Filled 2015-03-31 (×3): qty 1

## 2015-03-31 MED ORDER — DOXYCYCLINE HYCLATE 100 MG PO TABS
100.0000 mg | ORAL_TABLET | Freq: Two times a day (BID) | ORAL | Status: DC
  Administered 2015-03-31: 100 mg via ORAL
  Filled 2015-03-31 (×4): qty 1

## 2015-03-31 MED ORDER — PENTAFLUOROPROP-TETRAFLUOROETH EX AERO: 1.0000 "application " | INHALATION_SPRAY | CUTANEOUS | Status: DC | PRN

## 2015-03-31 MED ORDER — PIPERACILLIN-TAZOBACTAM IN DEX 2-0.25 GM/50ML IV SOLN
2.2500 g | Freq: Three times a day (TID) | INTRAVENOUS | Status: DC
  Administered 2015-03-31 – 2015-04-04 (×12): 2.25 g via INTRAVENOUS
  Filled 2015-03-31 (×17): qty 50

## 2015-03-31 MED ORDER — ALTEPLASE 2 MG IJ SOLR
2.0000 mg | Freq: Once | INTRAMUSCULAR | Status: DC | PRN
  Filled 2015-03-31: qty 2

## 2015-03-31 MED ORDER — AMIODARONE HCL 100 MG PO TABS
100.0000 mg | ORAL_TABLET | Freq: Every day | ORAL | Status: DC
  Administered 2015-03-31 – 2015-04-04 (×5): 100 mg via ORAL
  Filled 2015-03-31 (×4): qty 1

## 2015-03-31 MED ORDER — VANCOMYCIN HCL IN DEXTROSE 750-5 MG/150ML-% IV SOLN
750.0000 mg | Freq: Once | INTRAVENOUS | Status: DC
  Filled 2015-03-31: qty 150

## 2015-03-31 MED ORDER — ENOXAPARIN SODIUM 30 MG/0.3ML ~~LOC~~ SOLN: 30.0000 mg | SUBCUTANEOUS | Status: DC

## 2015-03-31 MED ORDER — ALUM & MAG HYDROXIDE-SIMETH 200-200-20 MG/5ML PO SUSP
30.0000 mL | Freq: Four times a day (QID) | ORAL | Status: DC | PRN
  Filled 2015-03-31: qty 30

## 2015-03-31 MED ORDER — MIDODRINE HCL 5 MG PO TABS
10.0000 mg | ORAL_TABLET | Freq: Every day | ORAL | Status: DC
  Administered 2015-03-31 – 2015-04-01 (×2): 10 mg via ORAL
  Filled 2015-03-31 (×2): qty 2

## 2015-03-31 MED ORDER — SODIUM CHLORIDE 0.9 % IV SOLN: 100.0000 mL | INTRAVENOUS | Status: DC | PRN

## 2015-03-31 MED ORDER — HEPARIN SODIUM (PORCINE) 1000 UNIT/ML DIALYSIS: 1000.0000 [IU] | INTRAMUSCULAR | Status: DC | PRN

## 2015-03-31 MED ORDER — RENA-VITE PO TABS
1.0000 | ORAL_TABLET | Freq: Every day | ORAL | Status: DC
  Administered 2015-04-01 – 2015-04-02 (×2): 1 via ORAL
  Filled 2015-03-31 (×4): qty 1

## 2015-03-31 MED ORDER — INSULIN ASPART 100 UNIT/ML ~~LOC~~ SOLN: 0.0000 [IU] | Freq: Three times a day (TID) | SUBCUTANEOUS | Status: DC

## 2015-03-31 MED ORDER — ACETAMINOPHEN 650 MG RE SUPP: 650.0000 mg | Freq: Four times a day (QID) | RECTAL | Status: DC | PRN

## 2015-03-31 MED ORDER — PREGABALIN 25 MG PO CAPS
25.0000 mg | ORAL_CAPSULE | Freq: Every day | ORAL | Status: DC
  Administered 2015-03-31 – 2015-04-03 (×3): 25 mg via ORAL
  Filled 2015-03-31 (×4): qty 1

## 2015-03-31 MED ORDER — NEPRO/CARBSTEADY PO LIQD
237.0000 mL | ORAL | Status: DC | PRN
  Filled 2015-03-31: qty 237

## 2015-03-31 MED ORDER — VANCOMYCIN HCL IN DEXTROSE 1-5 GM/200ML-% IV SOLN
1000.0000 mg | INTRAVENOUS | Status: DC
  Filled 2015-03-31: qty 200

## 2015-03-31 MED ORDER — AMIODARONE HCL 200 MG PO TABS
200.0000 mg | ORAL_TABLET | Freq: Once | ORAL | Status: DC
  Filled 2015-03-31: qty 1

## 2015-03-31 MED ORDER — VANCOMYCIN HCL IN DEXTROSE 1-5 GM/200ML-% IV SOLN
1000.0000 mg | Freq: Once | INTRAVENOUS | Status: AC
  Administered 2015-03-31: 1000 mg via INTRAVENOUS
  Filled 2015-03-31: qty 200

## 2015-03-31 MED ORDER — SODIUM CHLORIDE 0.9 % IV SOLN
62.5000 mg | INTRAVENOUS | Status: DC
  Administered 2015-03-31: 62.5 mg via INTRAVENOUS
  Filled 2015-03-31 (×2): qty 5

## 2015-03-31 MED ORDER — WARFARIN SODIUM 2 MG PO TABS: 2.0000 mg | ORAL_TABLET | ORAL | Status: DC

## 2015-03-31 MED ORDER — ATORVASTATIN CALCIUM 10 MG PO TABS
10.0000 mg | ORAL_TABLET | Freq: Every day | ORAL | Status: DC
  Administered 2015-04-01 – 2015-04-03 (×3): 10 mg via ORAL
  Filled 2015-03-31 (×6): qty 1

## 2015-03-31 MED ORDER — SODIUM CHLORIDE 0.9 % IV BOLUS (SEPSIS)
500.0000 mL | Freq: Once | INTRAVENOUS | Status: AC
  Administered 2015-03-31: 500 mL via INTRAVENOUS

## 2015-03-31 MED ORDER — ONDANSETRON HCL 4 MG/2ML IJ SOLN
4.0000 mg | Freq: Four times a day (QID) | INTRAMUSCULAR | Status: DC | PRN
  Administered 2015-03-31 – 2015-04-04 (×3): 4 mg via INTRAVENOUS
  Filled 2015-03-31 (×3): qty 2

## 2015-03-31 MED ORDER — ACETAMINOPHEN 325 MG PO TABS
650.0000 mg | ORAL_TABLET | Freq: Four times a day (QID) | ORAL | Status: DC | PRN
  Administered 2015-04-01: 650 mg via ORAL
  Filled 2015-03-31: qty 2

## 2015-03-31 MED ORDER — HYDROXYZINE HCL 25 MG PO TABS: 25.0000 mg | ORAL_TABLET | Freq: Four times a day (QID) | ORAL | Status: DC | PRN

## 2015-03-31 MED ORDER — SENNOSIDES-DOCUSATE SODIUM 8.6-50 MG PO TABS
1.0000 | ORAL_TABLET | Freq: Every evening | ORAL | Status: DC | PRN
  Filled 2015-03-31: qty 1

## 2015-03-31 MED ORDER — SEVELAMER CARBONATE 800 MG PO TABS
1600.0000 mg | ORAL_TABLET | Freq: Every day | ORAL | Status: DC
  Administered 2015-04-01 – 2015-04-03 (×3): 1600 mg via ORAL
  Filled 2015-03-31 (×6): qty 2

## 2015-03-31 MED ORDER — ASPIRIN EC 81 MG PO TBEC
81.0000 mg | DELAYED_RELEASE_TABLET | Freq: Every day | ORAL | Status: DC
  Administered 2015-04-01 – 2015-04-05 (×5): 81 mg via ORAL
  Filled 2015-03-31 (×7): qty 1

## 2015-03-31 NOTE — ED Notes (Signed)
Attempted report 

## 2015-03-31 NOTE — ED Provider Notes (Addendum)
CSN: [id number]     Arrival date & time 04/01/2015  0709 History   First MD Initiated Contact with Patient 03/21/2015 0720     Chief Complaint  Patient presents with  . Wrist Pain    right  . wound care     buttocks     (Consider location/radiation/quality/duration/timing/severity/associated sxs/prior Treatment) Patient is a 59 y.o. female presenting with wrist pain. The history is provided by the patient.  Wrist Pain Pertinent negatives include no chest pain.  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 She was headed to dialysis this morning, running late nad therefore was more active then usual when she had palpitations. Patient states she goes in and out of a fib at baseline. Patietn denies any chest pain but does endorse palpitations.  Denies any fevers, cough or other infectious symptoms. She did not take her home medications today.   Past Medical History  Diagnosis Date  . Diabetes mellitus, type 2   . Secondary hyperparathyroidism   . Ischemic cardiomyopathy     a. EF 25-30% 2011;  b.  Echocardiogram (09/01/2013): EF 30-35%, diffuse hypokinesis, MAC, mild MR, mild LAE, mild RVE, severely reduced RVSF, moderate RAE, mildly increased PASP.  [name] Pulmonary hypertension     Est PAsyst 60-Dr Linard Millers, off coumadin, CT neg for PE  . Hyperlipidemia   . Anemia   . Thyroid disease   . Pulmonary edema   . Peripheral vascular disease     a. Prior LE amputations. b. s/p prior LE bypass surgery.  . Sebaceous cyst   . 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 Richardson Dopp  . Shortness of breath     with exertion  . Hemodialysis patient     M,W,F  . TIA (transient ischemic attack) June 2014  . Ulcer of heel due to diabetes     left  . Atrial fibrillation     a. Dx 08/2013, placed on amiodarone.  .  Chronic systolic CHF (congestive heart failure)   . CAD (coronary artery disease)     a. s/p CABG 2011.;  b. Lexiscan Myoview (02/2013): Intermediate risk study; EF 50%, basal inferolateral, basal anterolateral and mid inferolateral moderate ischemia; basal anterior, basal anteroseptal and mid anterior scar;  c. Type 2 NSTEMI during admission for LE revasc surgery 02/2013 in setting of SVT  . Polyneuropathy in diabetes(357.2) 10/31/2013  . Neuropathy   . Headache   . Carpal tunnel syndrome, right   . Kidney disease Dialysis MWF    ESRD secondary to DM, failed transplant 2006. Dialysis M-W-F  . OSA (obstructive sleep apnea)     uses c-papNPSG 11.23.11-AHI 64.9/hr-central and obstructive apnea   Past Surgical History  Procedure Laterality Date  . Coronary artery bypass graft  2011    3V  . Insertion of dialysis catheter    . Above knee leg amputation Left     infection/gangrene 10/11/07  . Nasal mucosal biopsy    . Ovarian cyst surgery    . 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 Laser retinal surgery  2011  . Kidney transplant      rejection  . Abdominal angiogram Left 01/25/13  . Colonoscopy    . Cardiac catheterization    . Femoral-popliteal bypass graft Left 02/20/2013    Procedure:  FEMORAL-to below knee POPLITEAL ARTERY  bypasswith saphenous vein with intraoperative angiogram ;  Surgeon: Angelia Mould, MD;  Location: [place];  Service: Vascular;  Laterality: Left;  fem-below knee pop  . Amputation Left 03/27/2013    Procedure: AMPUTATION ABOVE KNEE;  Surgeon: Angelia Mould, MD;  Location: [place];  Service: Vascular;  Laterality: Left;  . Tee without cardioversion N/A 09/11/2013    Procedure: TRANSESOPHAGEAL ECHOCARDIOGRAM (TEE);  Surgeon: Casandra Doffing, MD;  Location: [facility] ENDOSCOPY;  Service: Cardiovascular;  Laterality: N/A;  . Cardioversion N/A 09/11/2013    Procedure: CARDIOVERSION;  Surgeon: Casandra Doffing, MD;  Location: [facility] ENDOSCOPY;   Service: Cardiovascular;  Laterality: N/A;  . Below knee leg amputation Right   . Revison of arteriovenous fistula Right 11/21/2013    Procedure: REVISON OF ARTERIOVENOUS FISTULA;  Surgeon: Rosetta Posner, MD;  Location: [place];  Service: Vascular;  Laterality: Right;  . Cataract surgery Right 01-07-2014  . Lower extremity angiogram Left 01/25/2013    Procedure: LOWER EXTREMITY ANGIOGRAM;  Surgeon: Conrad Weslaco, MD;  Location: [facility] CATH LAB;  Service: Cardiovascular;  Laterality: Left;  . Colonoscopy with propofol N/A 12/12/2014    Procedure: COLONOSCOPY WITH PROPOFOL;  Surgeon: Laurence Spates, MD;  Location: [place];  Service: Endoscopy;  Laterality: N/A;  . Eye surgery    . Eye surgery Left November 28, 2014    Laser   . I&d extremity Right 01/30/2015    Procedure: A CELL PLACEMENT, RIGHT LONG FINGER WITH DEBRIDEMENT OF WOUND ;  Surgeon: Leanora Cover, MD;  Location: [place];  Service: Orthopedics;  Laterality: Right;  . Amputation Right 03/06/2015    Procedure: RIGHT LONG FINGER AMPUTATION ;  Surgeon: Leanora Cover, MD;  Location: [place];  Service: Orthopedics;  Laterality: Right;   Family History  Problem Relation Age of Onset  . Heart disease    . Edema Father     fluid overload  . Diabetes Father   . Cerebral aneurysm Mother   . Diabetes Mother   . Stroke Brother   . Heart disease Brother   . Diabetes Brother   . Hyperlipidemia Brother   . Hypertension Brother   . Peripheral vascular disease Brother     amputation   History  Substance Use Topics  . Smoking status: Never Smoker   . Smokeless tobacco: Former Systems developer    Types: Snuff, Chew  . Alcohol Use: No   OB History    No data available     Review of Systems  Constitutional: Negative for activity change and fatigue.  HENT: Negative for congestion and drooling.   Eyes: Negative for discharge.  Respiratory: Negative for cough and chest tightness.   Cardiovascular: Positive for palpitations.  Negative for chest pain.  Gastrointestinal: Negative for abdominal distention.  Genitourinary: Negative for dysuria and difficulty urinating.  Musculoskeletal: Negative for joint swelling.       Wrist pain  Skin: Negative for rash.  Allergic/Immunologic: Negative for immunocompromised state.  Neurological: Negative for seizures and speech difficulty.  Psychiatric/Behavioral: Negative for behavioral problems and agitation.      Allergies  Neurontin; Ezetimibe-simvastatin; Klonopin; and Sertraline hcl  Home Medications   Prior to Admission medications   Medication Sig Start Date End Date Taking? Authorizing Provider  acetaminophen (TYLENOL) 325 MG tablet Take 650 mg by mouth every 6 (six) hours as needed.    Historical Provider, MD  amiodarone (PACERONE) 200 MG tablet Take 1 tablet (200 mg total) by mouth daily. Patient taking differently:  Take 100 mg by mouth daily.  01/14/14   Belva Crome, MD  aspirin EC 81 MG tablet Take 81 mg by mouth daily. 09/18/13   Debbe Odea, MD  atorvastatin (LIPITOR) 10 MG tablet Take 10 mg by mouth daily at 2 PM daily at 2 PM.     Historical Provider, MD  B Complex-C (SUPER B COMPLEX PO) Take 1 tablet by mouth daily at 2 PM daily at 2 PM.    Historical Provider, MD  B Complex-C-Folic Acid (DIALYVITE TABLET) TABS Take 1 tablet by mouth daily at 2 PM daily at 2 PM.  10/17/14   Historical Provider, MD  cinacalcet (SENSIPAR) 60 MG tablet Take 60 mg by mouth daily at 2 PM daily at 2 PM.     Historical Provider, MD  diphenoxylate-atropine (LOMOTIL) 2.5-0.025 MG per tablet Take by mouth 4 (four) times daily as needed for diarrhea or loose stools.    Historical Provider, MD  hydrOXYzine (ATARAX/VISTARIL) 25 MG tablet Take 25 mg by mouth every 6 (six) hours as needed for itching.  08/20/13   Historical Provider, MD  insulin aspart (NOVOLOG FLEXPEN) 100 UNIT/ML FlexPen Inject 2-15 Units into the skin 3 (three) times daily with meals. Per sliding scale    Historical  Provider, MD  midodrine (PROAMATINE) 10 MG tablet Take 10 mg by mouth daily at 2 PM daily at 2 PM.     Historical Provider, MD  pregabalin (LYRICA) 25 MG capsule Take 25 mg by mouth daily at 2 PM daily at 2 PM.  05/23/13   Mauricia Area, MD  sevelamer carbonate (RENVELA) 800 MG tablet Take 1,600 mg by mouth daily with lunch.     Historical Provider, MD  warfarin (COUMADIN) 4 MG tablet 4 mg. Take One and a half tablet Mon-Wed-Frid  And  Take one tablet Sun-Tue-Thur- Sat. 12/06/14   Historical Provider, MD   BP 101/53 mmHg  Pulse 113  Temp(Src) 98.5 F (36.9 C) (Oral)  Resp 15  Ht 4\' 8"  (1.422 m)  SpO2 100% Physical Exam  Constitutional: She is oriented to person, place, and time. She appears well-developed and well-nourished.  HENT:  Head: Normocephalic and atraumatic.  Eyes: Conjunctivae are normal. Right eye exhibits no discharge.  Neck: Neck supple.  Cardiovascular: Normal rate and normal heart sounds.   No murmur heard. afib  Pulmonary/Chest: Effort normal and breath sounds normal. She has no wheezes. She has no rales.  Abdominal: Soft. She exhibits no distension. There is no tenderness.  Multiple scars  Musculoskeletal: She exhibits no edema.  2 AKAs, good skin conditions.  Neurological: She is oriented to person, place, and time. No cranial nerve deficit.  Skin: Skin is warm and dry. She is not diaphoretic.  2 stage 3 ulcers on sacrum, no acitve purulence, healing.   R and L stump skin appears intact.  R finger, well healed.  Psychiatric: She has a normal mood and affect. Her behavior is normal.  Nursing note and vitals reviewed.   ED Course  Procedures (including critical care time) Labs Review Labs Reviewed  CBG MONITORING, ED - Abnormal; Notable for the following:    Glucose-Capillary 62 (*)    All other components within normal limits  CBC WITH DIFFERENTIAL/PLATELET  BASIC METABOLIC PANEL  TROPONIN I  PROTIME-INR    Imaging Review Dg Chest 2  View  04/07/2015   CLINICAL DATA:  Shortness of breath.  Chest pain.  EXAM: CHEST  2 VIEW  COMPARISON:  11/25/2014  FINDINGS:  Heart size and pulmonary vascularity are normal and the lungs are clear. No effusions. CABG. No acute osseous abnormality.  IMPRESSION: No active cardiopulmonary disease.   Electronically Signed   By: Lorriane Shire M.D.   On: 03/23/2015 07:59     EKG Interpretation   Date/Time:  Monday March 31 2015 08:20:45 EDT Ventricular Rate:  117 PR Interval:    QRS Duration: 117 QT Interval:  371 QTC Calculation: 518 R Axis:   -163 Text Interpretation:  Pacemaker spikes or artifacts No further analysis  attempted - not enough leads could be measured Low vollgate, consistent  with prior.  No evidence of acute ischemia. 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 Patient goes in and out of afib.  According to last cardiology note she is on amiodarone solely given chronic low BP. Currently in afib, HR between 110-120s.  Will givbe her home medications.  She denies any CP at this time.  IF her EKG is non ischemic and she remains rate controlled, will plan to have her contiue her outpatient dialysis.     Andrewjames Weirauch Julio Alm, MD 03/20/2015 [phone]  Although her amio helped her HR, her WBC elevated to 18.  Lactate sent and elevated. Will antibiose with Lucianne Lei zosyn.  Unknown source of WBC, but concerning for her sacral ulcers.  Althought the appear stable, they have a strong odor.  Will admit for abx, monitor, fluids and dialysis.   Celestina Gironda Julio Alm, MD 03/19/2015 1101

## 2015-03-31 NOTE — Procedures (Signed)
I was present at this dialysis session. I have reviewed the session itself and made appropriate changes.   [name]  MD 04/10/2015, 4:05 PM

## 2015-03-31 NOTE — Consult Note (Signed)
[facility] Renal Consultation Note    Indication for Consultation:  Management of ESRD/hemodialysis; anemia, hypertension/volume and secondary hyperparathyroidism PCP:  HPI: [name] is a 59 y.o. female with ESRD due to DM who has hemodialysis MWF at [facility]. PMH significant for DMT2, AFIB, Pulmonary hypertension, ischemic cardiomyopathy EF 30-35%, Hyperlipidemia, anemia, secondary hyperparathyroidism, PVD, systemic lupus, TIA, Chronic systolic HF, Failed kidney transplant in 2006 Thyroid disease. 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 Amputation distal portion of R Middle finger, decubitus ulcers on buttocks.   [name] was rushing to get to her usual HD treatment this morning.  When she arrived at HD unit, she felt her heart "racing" and was acutely SOB. Was transferred here for evaluation. Patient has nonproductive cough that started over the weekend. Denies fever, chills, N,V,D. No chest pain, no syncopal episodes. Has been admitted to evaluate SOB. Is on coumadin for AFIB, INR 6.97 on admission.   [name] attends hemodialysis regularly, no skipped or shortened treatments. Has been leaving .20 kg below EDW 81 kg. Last in center HGB 14.4 T sat 27% Ferritin 1269 (03/26/15) Ca 7.6  C Ca 9.4. Phos 4.6. Albumin 1.8 (03/26/15).    Past Medical History  Diagnosis Date   Diabetes mellitus, type 2    Secondary hyperparathyroidism    Ischemic cardiomyopathy     a. EF 25-30% 2011;  b.  Echocardiogram (09/01/2013): EF 30-35%, diffuse hypokinesis, MAC, mild MR, mild LAE, mild RVE, severely reduced RVSF, moderate RAE, mildly increased PASP.   Pulmonary hypertension     Est PAsyst 59-Dr Linard Millers, off coumadin, CT neg for PE   Hyperlipidemia    Anemia    Thyroid disease    Pulmonary edema    Peripheral vascular disease     a. Prior LE amputations. b. s/p prior LE bypass surgery.   Sebaceous cyst    Systemic lupus 08/08/07    Dr.  Richardson Dopp   Shortness of breath     with exertion   Hemodialysis patient     M,W,F   TIA (transient ischemic attack) June 2014   Ulcer of heel due to diabetes     left   Atrial fibrillation     a. Dx 08/2013, placed on amiodarone.   Chronic systolic CHF (congestive heart failure)    CAD (coronary artery disease)     a. s/p CABG 2011.;  b. Lexiscan Myoview (02/2013): Intermediate risk study; EF 50%, basal inferolateral, basal anterolateral and mid inferolateral moderate ischemia; basal anterior, basal anteroseptal and mid anterior scar;  c. Type 2 NSTEMI during admission for LE revasc surgery 02/2013 in setting of SVT   Polyneuropathy in diabetes(357.2) 10/31/2013   Neuropathy    Headache    Carpal tunnel syndrome, right    Kidney disease Dialysis MWF    ESRD secondary to DM, failed transplant 2006. Dialysis M-W-F   OSA (obstructive sleep apnea)     uses c-papNPSG 11.23.11-AHI 64.9/hr-central and obstructive apnea   Past Surgical History  Procedure Laterality Date   Coronary artery bypass graft  2011    3V   Insertion of dialysis catheter     Above knee leg amputation Left     infection/gangrene 10/11/07   Nasal mucosal biopsy     Ovarian cyst surgery     Removal of failed cadaveric renal transplant 2006-wound infection     Laser retinal surgery  2011   Kidney transplant      rejection   Abdominal  angiogram Left 01/25/13   Colonoscopy     Cardiac catheterization     Femoral-popliteal bypass graft Left 02/20/2013    Procedure:  FEMORAL-to below knee POPLITEAL ARTERY bypasswith saphenous vein with intraoperative angiogram ;  Surgeon: Angelia Mould, MD;  Location: [place];  Service: Vascular;  Laterality: Left;  fem-below knee pop   Amputation Left 03/27/2013    Procedure: AMPUTATION ABOVE KNEE;  Surgeon: Angelia Mould, MD;  Location: [place];  Service: Vascular;  Laterality: Left;   Tee without cardioversion N/A 09/11/2013    Procedure:  TRANSESOPHAGEAL ECHOCARDIOGRAM (TEE);  Surgeon: Casandra Doffing, MD;  Location: [facility] ENDOSCOPY;  Service: Cardiovascular;  Laterality: N/A;   Cardioversion N/A 09/11/2013    Procedure: CARDIOVERSION;  Surgeon: Casandra Doffing, MD;  Location: [facility] ENDOSCOPY;  Service: Cardiovascular;  Laterality: N/A;   Below knee leg amputation Right    Revison of arteriovenous fistula Right 11/21/2013    Procedure: REVISON OF ARTERIOVENOUS FISTULA;  Surgeon: Rosetta Posner, MD;  Location: [place];  Service: Vascular;  Laterality: Right;   Cataract surgery Right 01-07-2014   Lower extremity angiogram Left 01/25/2013    Procedure: LOWER EXTREMITY ANGIOGRAM;  Surgeon: Conrad Newburg, MD;  Location: [facility] CATH LAB;  Service: Cardiovascular;  Laterality: Left;   Colonoscopy with propofol N/A 12/12/2014    Procedure: COLONOSCOPY WITH PROPOFOL;  Surgeon: Laurence Spates, MD;  Location: [place];  Service: Endoscopy;  Laterality: N/A;   Eye surgery     Eye surgery Left November 28, 2014    Laser    I&d extremity Right 01/30/2015    Procedure: A CELL PLACEMENT, RIGHT LONG FINGER WITH DEBRIDEMENT OF WOUND ;  Surgeon: Leanora Cover, MD;  Location: [place];  Service: Orthopedics;  Laterality: Right;   Amputation Right 03/06/2015    Procedure: RIGHT LONG FINGER AMPUTATION ;  Surgeon: Leanora Cover, MD;  Location: [place];  Service: Orthopedics;  Laterality: Right;   Family History  Problem Relation Age of Onset   Heart disease     Edema Father     fluid overload   Diabetes Father    Cerebral aneurysm Mother    Diabetes Mother    Stroke Brother    Heart disease Brother    Diabetes Brother    Hyperlipidemia Brother    Hypertension Brother    Peripheral vascular disease Brother     amputation   Social History:  reports that she has never smoked. She has quit using smokeless tobacco. Her smokeless tobacco use included Snuff and Chew. She reports that she does not drink alcohol or use  illicit drugs. Allergies  Allergen Reactions   Neurontin [Gabapentin] Anaphylaxis   Ezetimibe-Simvastatin Itching and Swelling     facial edema   Klonopin [Clonazepam] Other (See Comments)    hallucination   Sertraline Hcl Other (See Comments)    hallucination   Prior to Admission medications   Medication Sig Start Date End Date Taking? Authorizing Provider  acetaminophen (TYLENOL) 325 MG tablet Take 650 mg by mouth every 6 (six) hours as needed.   Yes Historical Provider, MD  amiodarone (PACERONE) 200 MG tablet Take 1 tablet (200 mg total) by mouth daily. Patient taking differently: Take 100 mg by mouth daily.  01/14/14  Yes Belva Crome, MD  aspirin EC 81 MG tablet Take 81 mg by mouth daily. 09/18/13  Yes Debbe Odea, MD  atorvastatin (LIPITOR) 10 MG tablet Take 10 mg by mouth daily at 2 PM  daily at 2 PM.    Yes Historical Provider, MD  B Complex-C (SUPER B COMPLEX PO) Take 1 tablet by mouth daily at 2 PM daily at 2 PM.   Yes Historical Provider, MD  B Complex-C-Folic Acid (DIALYVITE TABLET) TABS Take 1 tablet by mouth daily at 2 PM daily at 2 PM.  10/17/14  Yes Historical Provider, MD  cinacalcet (SENSIPAR) 60 MG tablet Take 60 mg by mouth daily at 2 PM daily at 2 PM.    Yes Historical Provider, MD  diphenoxylate-atropine (LOMOTIL) 2.5-0.025 MG per tablet Take by mouth 4 (four) times daily as needed for diarrhea or loose stools.   Yes Historical Provider, MD  hydrOXYzine (ATARAX/VISTARIL) 25 MG tablet Take 25 mg by mouth every 6 (six) hours as needed for itching.  08/20/13  Yes Historical Provider, MD  insulin aspart (NOVOLOG FLEXPEN) 100 UNIT/ML FlexPen Inject 2-15 Units into the skin 3 (three) times daily with meals. Per sliding scale   Yes Historical Provider, MD  midodrine (PROAMATINE) 10 MG tablet Take 10 mg by mouth daily at 2 PM daily at 2 PM.    Yes Historical Provider, MD  pregabalin (LYRICA) 25 MG capsule Take 25 mg by mouth daily at 2 PM daily at 2 PM.  05/23/13  Yes Mauricia Area, MD  sevelamer carbonate (RENVELA) 800 MG tablet Take 1,600 mg by mouth daily with lunch.    Yes Historical Provider, MD  warfarin (COUMADIN) 4 MG tablet Take 2-4 mg by mouth See admin instructions. Take 4mg  Mon-Wed-Frid  And  Take 2mg  Sun-Tue-Thur- Sat. 12/06/14  Yes Historical Provider, MD  doxycycline (VIBRA-TABS) 100 MG tablet Take 100 mg by mouth 2 (two) times daily. 03/25/15   Historical Provider, MD   Current Facility-Administered Medications  Medication Dose Route Frequency Provider Last Rate Last Dose   amiodarone (PACERONE) tablet 100 mg  100 mg Oral Daily Courteney Lyn Mackuen, MD   100 mg at 03/24/2015 0939   Labs: Basic Metabolic Panel:  Recent Labs Lab 03/24/2015 0819  NA 130*  K 4.7  CL 95*  CO2 23  GLUCOSE 61*  BUN 31*  CREATININE 4.17*  CALCIUM 8.0*   CBC:  Recent Labs Lab 03/21/2015 0819  WBC 18.5*  NEUTROABS 15.9*  HGB 14.3  HCT 43.2  MCV 84.2  PLT 137*   Cardiac Enzymes:  Recent Labs Lab 03/14/2015 0819  TROPONINI 0.05*   CBG:  Recent Labs Lab 03/21/2015 0719  GLUCAP 62*   Iron Studies: No results for input(s): IRON, TIBC, TRANSFERRIN, FERRITIN in the last 72 hours. Studies/Results: Dg Chest 2 View  03/26/2015   CLINICAL DATA:  Shortness of breath.  Chest pain.  EXAM: CHEST  2 VIEW  COMPARISON:  11/25/2014  FINDINGS: Heart size and pulmonary vascularity are normal and the lungs are clear. No effusions. CABG. No acute osseous abnormality.  IMPRESSION: No active cardiopulmonary disease.   Electronically Signed   By: Lorriane Shire M.D.   On: 04/06/2015 07:59    ROS: As per HPI otherwise negative.  Subjective: "I'm calmed down now, feeling a little better".    Physical Exam: Filed Vitals:   03/16/2015 1111 03/20/2015 1130 03/23/2015 1200 04/06/2015 1256  BP:  100/54 102/61 81/62  Pulse:      Temp: 98.8 F (37.1 C)   98.4 F (36.9 C)  TempSrc: Oral   Oral  Resp:  20 22 12   Height:    4\' 8"  (1.422 m)  Weight:    83.915 kg (  185 lb)  SpO2:     98%     General: Well developed, well nourished, in no acute distress. Head: Normocephalic, atraumatic, sclera non-icteric, mucus membranes are moist Neck: Supple. JVD not elevated. Lungs: Bilateral breath sounds with few scattered rhonchi which cleared with cough. No WOB.   Heart: Irregular with S1 S2. II/VI systolic M.  Abdomen: Soft, non-tender, non-distended with normoactive bowel sounds. No rebound/guarding. No obvious abdominal masses. Skin: Warm, dry with decubitus wound R & L buttocks. Nonpurulent drainage. No rashes, pruritus. Lower extremities: R BKA L AKA. Has trace edema RUE, trace to 1+ edema R BKA.  Neuro: Alert and oriented X 3. Moves all extremities spontaneously. Psych:  Responds to questions appropriately with a normal affect. Dialysis Access: RFA AVF + Thrill + Bruit  Dialysis Orders: Center: [facility]  on MWF . EDW 81 kg HD [place] 3.0 K 2.5 Ca  Time 4 hours Heparin 8000 units. Access RFA AVF BFR 400 DFR 800    Calcitriol .25 mcg PO q MWF Venofer 50 mg IV q week with HD  Assessment/Plan: 1.  Bacteremia from wound: WBC 18.5  Per primary. Vanc & Zosyn. 2.  ESRD -  M,W,F. No heparin until INR within range. INR 6.97  3.  Hypotension/volume  - Takes Midodrine 10 mg PO BID. Has been leaving HD center .2 kg below EDW. Will Attempt 2 kg today. 4.  Anemia  - 14.3. Venofer 50 mg weekly. Last in center T Sat 27% (03/26/15). Will Follow CBC. 5.  Metabolic bone disease -  Phos controlled. Continue binders.  6.  Nutrition - Albumin 1.8. Dietary consult for protein malnutrition. Renal diet/Renal vit/nephro.  7. DM:  Per Primary 8. AFIB: Afib on monitor. Coumadin on hold INR 6.97. Per primary. No heparin in HD.  9.  Decubitus Ulcer: Wound consult for management of wounds per primary.   Rita H. Owens Shark, NP-C 03/20/2015, 1:22 PM  [employer] [phone]

## 2015-03-31 NOTE — ED Notes (Addendum)
Per EMS- pt here from dialysis. Pt woke up late and was rushing this morning and arrived to dialysis feeling tired, and like her heart was racing but denies chest pain. The feeling of heart racing has subsided. Pt also has pain to her sore on her buttocks/hip area. Right middle finger also bothering her. Pt c/o weakness. CBG was 66. 124/82 HR-80.  Pt also has swelling to right hand and right wrist pain 6/10.

## 2015-03-31 NOTE — Progress Notes (Signed)
[place] for Vancomycin, Zosyn, and Warfarin Indication: Sacral Wound infection, Atrial fibrillation   Allergies  Allergen Reactions  . Neurontin [Gabapentin] Anaphylaxis  . Ezetimibe-Simvastatin Itching and Swelling     facial edema  . Klonopin [Clonazepam] Other (See Comments)    hallucination  . Sertraline Hcl Other (See Comments)    hallucination    Patient Measurements: Height: 4\' 8"  (142.2 cm) Weight: 185 lb (83.915 kg) IBW/kg (Calculated) : 36.3  Vital Signs: Temp: 98.4 F (36.9 C) (07/18 1256) Temp Source: Oral (07/18 1256) BP: 81/62 mmHg (07/18 1256) Pulse Rate: 116 (07/18 1052) Intake/Output from previous day:   Intake/Output from this shift: Total I/O In: 700 [I.V.:700] Out: -  Vent settings for last 24 hours:    Labs:  Recent Labs  04/08/2015 0819 03/22/2015 0820  WBC 18.5*  --   HGB 14.3  --   HCT 43.2  --   PLT 137*  --   INR  --  6.97*  CREATININE 4.17*  --    Estimated Creatinine Clearance: 12.5 mL/min (by C-G formula based on Cr of 4.17).   Recent Labs  03/24/2015 0719  GLUCAP 54*    Microbiology: No results found for this or any previous visit (from the past 720 hour(s)).  Medications:  Anti-infectives    Start     Dose/Rate Route Frequency Ordered Stop   03/16/2015 1400  doxycycline (VIBRA-TABS) tablet 100 mg     100 mg Oral 2 times daily 04/02/2015 1358     04/09/2015 1100  piperacillin-tazobactam (ZOSYN) IVPB 2.25 g     2.25 g 100 mL/hr over 30 Minutes Intravenous  Once 03/18/2015 1057 03/26/2015 1247   04/04/2015 1045  vancomycin (VANCOCIN) IVPB 1000 mg/200 mL premix     1,000 mg 200 mL/hr over 60 Minutes Intravenous  Once 03/19/2015 1035 04/01/2015 1207      Assessment: 59 year old female admitted from hemodialysis with tachycardia.  She is to continue Vancomycin and Zosyn for sacral wound infection. Note she is ESRD with HD on MWF. She is to receive HD later today.   She is also on chronic anticoagulation  with Coumadin for atrial fibrillation.  Her INR is supratherapeutic at 6.97.  Goal of Therapy:  preHD Vancomycin level 15-25 mcg/ml INR 2-3  Plan:  Vancomycin 750mg  IV x 1 now to complete her loading dose Vancomycin 1gm IV after each HD session Zosyn 2.25gm IV q8h Monitor for HD tolerance and schedule Follow available micro data No Coumadin today Daily PT/INR monitoring  Legrand Como, Pharm.D., BCPS, AAHIVP Clinical Pharmacist Phone: [phone] or [phone] 03/16/2015, 2:22 PM

## 2015-03-31 NOTE — ED Notes (Signed)
Pt cbg 61

## 2015-03-31 NOTE — ED Notes (Addendum)
2cm/2cm stage 2 wound to right buttocks.  4cm/3cm stage 2 wound to left buttocks. 1cm/1cm stage 2 wound to sacrum.

## 2015-03-31 NOTE — H&P (Signed)
History and Physical  [name] UDJ:[id number] DOB: August 21, 1956 DOA: 03/26/2015  Referring physician: Dr Thomasene Lot, ED physician PCP: Mauricio Po, FNP   Chief Complaint: Palpitations  HPI: [name] is a 59 y.o. 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 The patient was sent to the emergency room and from dialysis due to palpitations and racing heart rate.  In addition to feeling short of breath. On arrival in the emergency department, she was found to be in nature fibrillation with RVR. The patient has paroxysmal atrial fibrillation and goes in and out of atrial fibrillation at baseline. The patient was given her dose of amiodarone and responded well.  However, on laboratory examination she was found to leukocytosis and elevated lactic acid. She admits to sacral ulcers have been worsening in pain and drainage over the past few days. She regularly sees wound care for treatment.   Review of Systems:   Pt denies any fevers, chills, nausea, vomiting, chest pain, headache, blurred vision, abdominal pain, diarrhea, constipation, melena, rectal bleeding.  Review of systems are otherwise negative  Past Medical History  Diagnosis Date  . Diabetes mellitus, type 2   . Secondary hyperparathyroidism   . Ischemic cardiomyopathy     a. EF 25-30% 2011;  b.  Echocardiogram (09/01/2013): EF 30-35%, diffuse hypokinesis, MAC, mild MR, mild LAE, mild RVE, severely reduced RVSF, moderate RAE, mildly increased PASP.  [name] Pulmonary hypertension     Est PAsyst 59-Dr Linard Millers, off coumadin, CT neg for PE  . Hyperlipidemia   . Anemia   . Thyroid disease   .  Pulmonary edema   . Peripheral vascular disease     a. Prior LE amputations. b. s/p prior LE bypass surgery.  . Sebaceous cyst   . 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 Richardson Dopp  . Shortness of breath     with exertion  . Hemodialysis patient     M,W,F  . TIA (transient ischemic attack) June 2014  . Ulcer of heel due to diabetes     left  . Atrial fibrillation     a. Dx 08/2013, placed on amiodarone.  . Chronic systolic CHF (congestive heart failure)   . CAD (coronary artery disease)     a. s/p CABG 2011.;  b. Lexiscan Myoview (02/2013): Intermediate risk study; EF 50%, basal inferolateral, basal anterolateral and mid inferolateral moderate ischemia; basal anterior, basal anteroseptal and mid anterior scar;  c. Type 2 NSTEMI during admission for LE revasc surgery 02/2013 in setting of SVT  . Polyneuropathy in diabetes(357.2) 10/31/2013  . Neuropathy   . Headache   . Carpal tunnel syndrome, right   . Kidney disease Dialysis MWF    ESRD secondary to DM, failed transplant 2006. Dialysis M-W-F  . OSA (obstructive sleep apnea)     uses c-papNPSG 11.23.11-AHI 64.9/hr-central and obstructive apnea   Past Surgical History  Procedure Laterality Date  . Coronary artery bypass graft  2011    3V  . Insertion of dialysis catheter    . Above knee leg amputation Left     infection/gangrene 10/11/07  . Nasal mucosal biopsy    . Ovarian cyst surgery    . 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  Laser retinal surgery  2011  . Kidney transplant      rejection  . Abdominal angiogram Left 01/25/13  . Colonoscopy    . Cardiac catheterization    . Femoral-popliteal bypass graft Left 02/20/2013    Procedure:  FEMORAL-to below knee POPLITEAL ARTERY bypasswith saphenous vein with intraoperative angiogram ;  Surgeon: Angelia Mould, MD;  Location: [facility];  Service: Vascular;  Laterality: Left;  fem-below knee pop  . Amputation Left 03/27/2013    Procedure:  AMPUTATION ABOVE KNEE;  Surgeon: Angelia Mould, MD;  Location: [facility];  Service: Vascular;  Laterality: Left;  . Tee without cardioversion N/A 09/11/2013    Procedure: TRANSESOPHAGEAL ECHOCARDIOGRAM (TEE);  Surgeon: Casandra Doffing, MD;  Location: [facility] ENDOSCOPY;  Service: Cardiovascular;  Laterality: N/A;  . Cardioversion N/A 09/11/2013    Procedure: CARDIOVERSION;  Surgeon: Casandra Doffing, MD;  Location[facility] ENDOSCOPY;  Service: Cardiovascular;  Laterality: N/A;  . Below knee leg amputation Right   . Revison of arteriovenous fistula Right 11/21/2013    Procedure: REVISON OF ARTERIOVENOUS FISTULA;  Surgeon: Rosetta Posner, MD;  Location: [facility];  Service: Vascular;  Laterality: Right;  . Cataract surgery Right 01-07-2014  . Lower extremity angiogram Left 01/25/2013    Procedure: LOWER EXTREMITY ANGIOGRAM;  Surgeon: Conrad Florence, MD;  Location: [facility] CATH LAB;  Service: Cardiovascular;  Laterality: Left;  . Colonoscopy with propofol N/A 12/12/2014    Procedure: COLONOSCOPY WITH PROPOFOL;  Surgeon: Laurence Spates, MD;  Location: [facility];  Service: Endoscopy;  Laterality: N/A;  . Eye surgery    . Eye surgery Left November 28, 2014    Laser   . I&d extremity Right 01/30/2015    Procedure: A CELL PLACEMENT, RIGHT LONG FINGER WITH DEBRIDEMENT OF WOUND ;  Surgeon: Leanora Cover, MD;  Location: [place];  Service: Orthopedics;  Laterality: Right;  . Amputation Right 03/06/2015    Procedure: RIGHT LONG FINGER AMPUTATION ;  Surgeon: Leanora Cover, MD;  Location: [place];  Service: Orthopedics;  Laterality: Right;   Social History:  reports that she has never smoked. She has quit using smokeless tobacco. Her smokeless tobacco use included Snuff and Chew. She reports that she does not drink alcohol or use illicit drugs. Patient lives at home & is able to participate in activities of daily living  Allergies  Allergen Reactions  . Neurontin [Gabapentin] Anaphylaxis  .  Ezetimibe-Simvastatin Itching and Swelling     facial edema  . Klonopin [Clonazepam] Other (See Comments)    hallucination  . Sertraline Hcl Other (See Comments)    hallucination    Family History  Problem Relation Age of Onset  . Heart disease    . Edema Father     fluid overload  . Diabetes Father   . Cerebral aneurysm Mother   . Diabetes Mother   . Stroke Brother   . Heart disease Brother   . Diabetes Brother   . Hyperlipidemia Brother   . Hypertension Brother   . Peripheral vascular disease Brother     amputation     Prior to Admission medications   Medication Sig Start Date End Date Taking? Authorizing Provider  acetaminophen (TYLENOL) 325 MG tablet Take 650 mg by mouth every 6 (six) hours as needed.   Yes Historical Provider, MD  amiodarone (PACERONE) 200 MG tablet Take 1 tablet (200 mg total) by mouth daily. Patient taking differently: Take 100 mg by mouth daily.  01/14/14  Yes Mallie Mussel  Nicholes Stairs, MD  aspirin EC 81 MG tablet Take 81 mg by mouth daily. 09/18/13  Yes Debbe Odea, MD  atorvastatin (LIPITOR) 10 MG tablet Take 10 mg by mouth daily at 2 PM daily at 2 PM.    Yes Historical Provider, MD  B Complex-C (SUPER B COMPLEX PO) Take 1 tablet by mouth daily at 2 PM daily at 2 PM.   Yes Historical Provider, MD  B Complex-C-Folic Acid (DIALYVITE TABLET) TABS Take 1 tablet by mouth daily at 2 PM daily at 2 PM.  10/17/14  Yes Historical Provider, MD  cinacalcet (SENSIPAR) 60 MG tablet Take 60 mg by mouth daily at 2 PM daily at 2 PM.    Yes Historical Provider, MD  diphenoxylate-atropine (LOMOTIL) 2.5-0.025 MG per tablet Take by mouth 4 (four) times daily as needed for diarrhea or loose stools.   Yes Historical Provider, MD  hydrOXYzine (ATARAX/VISTARIL) 25 MG tablet Take 25 mg by mouth every 6 (six) hours as needed for itching.  08/20/13  Yes Historical Provider, MD  insulin aspart (NOVOLOG FLEXPEN) 100 UNIT/ML FlexPen Inject 2-15 Units into the skin 3 (three) times daily with meals. Per  sliding scale   Yes Historical Provider, MD  midodrine (PROAMATINE) 10 MG tablet Take 10 mg by mouth daily at 2 PM daily at 2 PM.    Yes Historical Provider, MD  pregabalin (LYRICA) 25 MG capsule Take 25 mg by mouth daily at 2 PM daily at 2 PM.  05/23/13  Yes Mauricia Area, MD  sevelamer carbonate (RENVELA) 800 MG tablet Take 1,600 mg by mouth daily with lunch.    Yes Historical Provider, MD  warfarin (COUMADIN) 4 MG tablet Take 2-4 mg by mouth See admin instructions. Take 4mg  Mon-Wed-Frid  And  Take 2mg  Sun-Tue-Thur- Sat. 12/06/14  Yes Historical Provider, MD  doxycycline (VIBRA-TABS) 100 MG tablet Take 100 mg by mouth 2 (two) times daily. 03/25/15   Historical Provider, MD    Physical Exam: BP 81/62 mmHg  Pulse 116  Temp(Src) 98.4 F (36.9 C) (Oral)  Resp 12  Ht 4\' 8"  (1.422 m)  Wt 83.915 kg (185 lb)  BMI 41.50 kg/m2  SpO2 98%  General: Elderly black female. Awake and alert and oriented x3. No acute cardiopulmonary distress.  Eyes: Pupils equal, round, reactive to light. Extraocular muscles are intact. Sclerae anicteric and noninjected.  ENT:  Moist mucosal membranes. No mucosal lesions. Teeth in moderate repair  Neck: Neck supple without lymphadenopathy. No carotid bruits. No masses palpated.  Cardiovascular: Irregular rate with normal S1-S2 sounds. No murmurs, rubs, gallops auscultated. No JVD.  Respiratory: Good respiratory effort with no wheezes, rales, rhonchi. Lungs clear to auscultation bilaterally.  Abdomen: Soft, nontender, nondistended. Active bowel sounds. No masses or hepatosplenomegaly  Skin: Dry, warm to touch. 2+ dorsalis pedis and radial pulses. She has 2 stage III ulcers on her sacrum with mild purulence. Skin on her right and left stump appear intact. Right finger appears well-healed. Musculoskeletal: No calf or leg pain. All major joints not erythematous nontender.  Psychiatric: Intact judgment and insight.  Neurologic: No focal neurological deficits. Cranial nerves  II through XII are grossly intact.           Labs on Admission:  Basic Metabolic Panel:  Recent Labs Lab 04/03/2015 0819  NA 130*  K 4.7  CL 95*  CO2 23  GLUCOSE 61*  BUN 31*  CREATININE 4.17*  CALCIUM 8.0*   Liver Function Tests: No results for input(s): AST, ALT,  ALKPHOS, BILITOT, PROT, ALBUMIN in the last 168 hours. No results for input(s): LIPASE, AMYLASE in the last 168 hours. No results for input(s): AMMONIA in the last 168 hours. CBC:  Recent Labs Lab 03/30/2015 0819  WBC 18.5*  NEUTROABS 15.9*  HGB 14.3  HCT 43.2  MCV 84.2  PLT 137*   Cardiac Enzymes:  Recent Labs Lab 04/09/2015 0819  TROPONINI 0.05*    BNP (last 3 results) No results for input(s): BNP in the last 8760 hours.  ProBNP (last 3 results) No results for input(s): PROBNP in the last 8760 hours.  CBG:  Recent Labs Lab 04/13/2015 0719  GLUCAP 62*    Radiological Exams on Admission: Dg Chest 2 View  03/25/2015   CLINICAL DATA:  Shortness of breath.  Chest pain.  EXAM: CHEST  2 VIEW  COMPARISON:  11/25/2014  FINDINGS: Heart size and pulmonary vascularity are normal and the lungs are clear. No effusions. CABG. No acute osseous abnormality.  IMPRESSION: No active cardiopulmonary disease.   Electronically Signed   By: Lorriane Shire M.D.   On: 04/10/2015 07:59    EKG: Independently reviewed. Ventricular rate of 117.  Assessment/Plan Present on Admission:  . Wound infection . Atrial fibrillation with RVR . Leucocytosis  This patient was discussed with the ED physician, including pertinent vitals, physical exam findings, labs, and imaging.  We also discussed care given by the ED provider.  [facility] for dialysis Continue vancomycin and Zosyn for wound infection due to elevated white count and possible hospital-acquired pathogens Wound culture Blood cultures Recheck CBC in the morning Continue to follow lactic acid Hold Coumadin As patient is not currently bleeding, no  need to act more aggressively for her supratherapeutic INR  DVT prophylaxis: Supratherapeutic on Coumadin  Consultants: Nephrology  Code Status: Full code  Family Communication: None   Disposition Plan: Pending   Truett Mainland, DO [facility] Pager [phone]

## 2015-04-01 ENCOUNTER — Encounter (HOSPITAL_COMMUNITY): Payer: Self-pay | Admitting: General Practice

## 2015-04-01 DIAGNOSIS — R6521 Severe sepsis with septic shock: Secondary | ICD-10-CM

## 2015-04-01 DIAGNOSIS — R7889 Finding of other specified substances, not normally found in blood: Secondary | ICD-10-CM

## 2015-04-01 DIAGNOSIS — A419 Sepsis, unspecified organism: Principal | ICD-10-CM

## 2015-04-01 DIAGNOSIS — L8990 Pressure ulcer of unspecified site, unspecified stage: Secondary | ICD-10-CM | POA: Insufficient documentation

## 2015-04-01 LAB — GLUCOSE, CAPILLARY
GLUCOSE-CAPILLARY: 104 mg/dL — AB (ref 65–99)
GLUCOSE-CAPILLARY: 105 mg/dL — AB (ref 65–99)
GLUCOSE-CAPILLARY: 91 mg/dL (ref 65–99)
GLUCOSE-CAPILLARY: 100 mg/dL — AB (ref 65–99)
Glucose-Capillary: 56 mg/dL — ABNORMAL LOW (ref 65–99)
Glucose-Capillary: 76 mg/dL (ref 65–99)

## 2015-04-01 LAB — BASIC METABOLIC PANEL
Anion gap: 10 (ref 5–15)
BUN: 14 mg/dL (ref 6–20)
CO2: 26 mmol/L (ref 22–32)
Calcium: 7.4 mg/dL — ABNORMAL LOW (ref 8.9–10.3)
Chloride: 96 mmol/L — ABNORMAL LOW (ref 101–111)
Creatinine, Ser: 2.86 mg/dL — ABNORMAL HIGH (ref 0.44–1.00)
GFR calc non Af Amer: 17 mL/min — ABNORMAL LOW (ref >60)
GFR, EST AFRICAN AMERICAN: 20 mL/min — AB (ref >60)
Glucose, Bld: 52 mg/dL — ABNORMAL LOW (ref 65–99)
POTASSIUM: 3.5 mmol/L (ref 3.5–5.1)
SODIUM: 132 mmol/L — AB (ref 135–145)

## 2015-04-01 LAB — CBC
HCT: 40.3 % (ref 36.0–46.0)
Hemoglobin: 13.1 g/dL (ref 12.0–15.0)
MCH: 27.7 pg (ref 26.0–34.0)
MCHC: 32.5 g/dL (ref 30.0–36.0)
MCV: 85.2 fL (ref 78.0–100.0)
Platelets: 138 10*3/uL — ABNORMAL LOW (ref 150–400)
RBC: 4.73 MIL/uL (ref 3.87–5.11)
RDW: 16.4 % — AB (ref 11.5–15.5)
WBC: 19.5 10*3/uL — ABNORMAL HIGH (ref 4.0–10.5)

## 2015-04-01 LAB — LACTIC ACID, PLASMA
LACTIC ACID, VENOUS: 2.3 mmol/L — AB (ref 0.5–2.0)
Lactic Acid, Venous: 2.5 mmol/L (ref 0.5–2.0)

## 2015-04-01 LAB — PROTIME-INR
INR: 8.24 — AB (ref 0.00–1.49)
Prothrombin Time: 65.4 seconds — ABNORMAL HIGH (ref 11.6–15.2)

## 2015-04-01 LAB — OSMOLALITY: OSMOLALITY: 282 mosm/kg (ref 275–300)

## 2015-04-01 LAB — TSH: TSH: 0.963 u[IU]/mL (ref 0.350–4.500)

## 2015-04-01 MED ORDER — SODIUM CHLORIDE 0.9 % IV BOLUS (SEPSIS)
500.0000 mL | Freq: Once | INTRAVENOUS | Status: AC
  Administered 2015-04-01: 500 mL via INTRAVENOUS

## 2015-04-01 MED ORDER — SODIUM CHLORIDE 0.9 % IV BOLUS (SEPSIS): 1000.0000 mL | Freq: Once | INTRAVENOUS | Status: AC

## 2015-04-01 MED ORDER — VITAMIN K1 10 MG/ML IJ SOLN
5.0000 mg | Freq: Once | INTRAVENOUS | Status: AC
  Administered 2015-04-01: 5 mg via INTRAVENOUS
  Filled 2015-04-01: qty 0.5

## 2015-04-01 MED ORDER — MIDODRINE HCL 5 MG PO TABS
10.0000 mg | ORAL_TABLET | Freq: Three times a day (TID) | ORAL | Status: DC
  Filled 2015-04-01: qty 2

## 2015-04-01 MED ORDER — MIDODRINE HCL 5 MG PO TABS
10.0000 mg | ORAL_TABLET | Freq: Three times a day (TID) | ORAL | Status: DC
  Administered 2015-04-01 – 2015-04-04 (×7): 10 mg via ORAL
  Filled 2015-04-01 (×13): qty 2

## 2015-04-01 MED ORDER — COLLAGENASE 250 UNIT/GM EX OINT
TOPICAL_OINTMENT | Freq: Every day | CUTANEOUS | Status: DC
  Administered 2015-04-01 – 2015-04-07 (×7): via TOPICAL
  Filled 2015-04-01: qty 30

## 2015-04-01 MED ORDER — SODIUM CHLORIDE 0.9 % IV SOLN: Freq: Once | INTRAVENOUS | Status: DC

## 2015-04-01 MED ORDER — CETYLPYRIDINIUM CHLORIDE 0.05 % MT LIQD
7.0000 mL | Freq: Two times a day (BID) | OROMUCOSAL | Status: DC
  Administered 2015-04-01 – 2015-04-04 (×6): 7 mL via OROMUCOSAL

## 2015-04-01 MED ORDER — DEXTROSE 5 % IV SOLN
30.0000 ug/min | INTRAVENOUS | Status: DC
  Administered 2015-04-01 – 2015-04-02 (×3): 30 ug/min via INTRAVENOUS
  Administered 2015-04-02: 35 ug/min via INTRAVENOUS
  Administered 2015-04-02: 25 ug/min via INTRAVENOUS
  Administered 2015-04-02: 40 ug/min via INTRAVENOUS
  Administered 2015-04-03: 30 ug/min via INTRAVENOUS
  Administered 2015-04-03: 25 ug/min via INTRAVENOUS
  Administered 2015-04-03: 40 ug/min via INTRAVENOUS
  Administered 2015-04-03: 25 ug/min via INTRAVENOUS
  Administered 2015-04-04 (×3): 40 ug/min via INTRAVENOUS
  Filled 2015-04-01 (×14): qty 1

## 2015-04-01 NOTE — Progress Notes (Signed)
Discussed patient condition and bp with Rahul PA and E-md. Patient currently has only 1 PIV which limits the amount of fluids and medication that I can push. I have put in a consult to IV team. Will continue to monitor very closely.

## 2015-04-01 NOTE — Progress Notes (Signed)
The patient's HR was sustaining between the 120s and 140s in A. fib.  Her systolic BP was in the mid 70s and her temp was 100.1.  K. Schorr was notified.  New orders were given to administer a 500 mL/hr bolus.

## 2015-04-01 NOTE — Progress Notes (Signed)
ANTICOAGULATION CONSULT NOTE - Follow Up Consult  Pharmacy Consult for Warfarin Indication: atrial fibrillation  Allergies  Allergen Reactions  . Neurontin [Gabapentin] Anaphylaxis  . Ezetimibe-Simvastatin Itching and Swelling     facial edema  . Klonopin [Clonazepam] Other (See Comments)    hallucination  . Sertraline Hcl Other (See Comments)    hallucination    Patient Measurements: Height: 4\' 8"  (142.2 cm) Weight: 183 lb 3.2 oz (83.1 kg) IBW/kg (Calculated) : 36.3   Vital Signs: Temp: 98.5 F (36.9 C) (07/19 1107) Temp Source: Oral (07/19 1107) BP: 82/58 mmHg (07/19 1204) Pulse Rate: 67 (07/19 1204)  Labs:  Recent Labs  04/05/2015 0819 03/17/2015 0820 04/01/15 0434  HGB 14.3  --  13.1  HCT 43.2  --  40.3  PLT 137*  --  138*  LABPROT  --  57.7* 65.4*  INR  --  6.97* 8.24*  CREATININE 4.17*  --  2.86*  TROPONINI 0.05*  --   --     Estimated Creatinine Clearance: 18.2 mL/min (by C-G formula based on Cr of 2.86).   Medications:  Scheduled:  . amiodarone  100 mg Oral Daily  . aspirin EC  81 mg Oral Daily  . atorvastatin  10 mg Oral q1800  . cinacalcet  60 mg Oral Q supper  . collagenase   Topical Daily  . ferric gluconate (FERRLECIT/NULECIT) IV  62.5 mg Intravenous Weekly  . insulin aspart  0-15 Units Subcutaneous TID WC  . midodrine  10 mg Oral Q1400  . multivitamin  1 tablet Oral Q1400  . piperacillin-tazobactam (ZOSYN)  IV  2.25 g Intravenous 3 times per day  . pregabalin  25 mg Oral Q1400  . sevelamer carbonate  1,600 mg Oral Q lunch  . sodium chloride  3 mL Intravenous Q12H  . [START ON 04/02/2015] vancomycin  1,000 mg Intravenous Q M,W,F-HD    Assessment: 59 year old female admitted from hemodialysis with tachycardia. She is on chronic anticoagulation with Coumadin for atrial fibrillation. Her home regimen is Warfarin 4mg  MWF and Warfarin 2mg  TTSS.  Her INR remains supratherapeutic at 8.24. Goal of Therapy:  INR 2-3 Monitor platelets by  anticoagulation protocol: Yes   Plan:  -Continue to hold coumadin for supratherapeutic INR -Monitor for s/sx bleeding -Daily INR  Reatha Harps, PharmD, BCPS 04/01/2015,12:30 PM

## 2015-04-01 NOTE — Progress Notes (Signed)
PROGRESS NOTE  [name] TFT:[id number] DOB: 1955/12/08 DOA: 04/08/2015 PCP: Mauricio Po, FNP  Assessment/Plan: Wound infection- -vanc/zosyn Blood cultures -seen by wound care nurse  leukocytosis -trend -cultures -abx  ESRD- HD M/W/F  supratherapeutic INR- no bleeding, trend  Paroxysmal a fib- currently in sinus  DM -SSI- blood sugars low  Code Status: full Family Communication: patient Disposition Plan:    Consultants:  renal  Procedures:      HPI/Subjective: Feeling a little better No fevers  Objective: Filed Vitals:   04/01/15 1204  BP: 82/58  Pulse: 67  Temp:   Resp: 16    Intake/Output Summary (Last 24 hours) at 04/01/15 1254 Last data filed at 04/01/15 0912  Gross per 24 hour  Intake    530 ml  Output   1003 ml  Net   -473 ml   Filed Weights   03/17/2015 1256 04/12/2015 1520 03/18/2015 1957  Weight: 83.915 kg (185 lb) 84.2 kg (185 lb 10 oz) 83.1 kg (183 lb 3.2 oz)    Exam:   General:  A+Ox3, NAD- chronically ill appearing  Cardiovascular: rrr  Respiratory: clear  Abdomen: +BS, soft  Musculoskeletal: b/l amputee   Data Reviewed: Basic Metabolic Panel:  Recent Labs Lab 03/16/2015 0819 04/01/15 0434  NA 130* 132*  K 4.7 3.5  CL 95* 96*  CO2 23 26  GLUCOSE 61* 52*  BUN 31* 14  CREATININE 4.17* 2.86*  CALCIUM 8.0* 7.4*   Liver Function Tests: No results for input(s): AST, ALT, ALKPHOS, BILITOT, PROT, ALBUMIN in the last 168 hours. No results for input(s): LIPASE, AMYLASE in the last 168 hours. No results for input(s): AMMONIA in the last 168 hours. CBC:  Recent Labs Lab 03/28/2015 0819 04/01/15 0434  WBC 18.5* 19.5*  NEUTROABS 15.9*  --   HGB 14.3 13.1  HCT 43.2 40.3  MCV 84.2 85.2  PLT 137* 138*   Cardiac Enzymes:  Recent Labs Lab 04/12/2015 0819  TROPONINI 0.05*   BNP (last 3 results) No results for input(s): BNP in the last 8760 hours.  ProBNP (last 3 results) No results for input(s): PROBNP in  the last 8760 hours.  CBG:  Recent Labs Lab 03/24/2015 2110 03/21/2015 2306 04/01/15 0656 04/01/15 0741 04/01/15 1129  GLUCAP 50* 76 56* 76 104*    Recent Results (from the past 240 hour(s))  Wound culture     Status: None (Preliminary result)   Collection Time: 03/24/2015  1:59 PM  Result Value Ref Range Status   Specimen Description WOUND SACRAL  Final   Special Requests Normal  Final   Gram Stain   Final    NO WBC SEEN NO SQUAMOUS EPITHELIAL CELLS SEEN NO ORGANISMS SEEN Performed at [facility]    Culture   Final    NO GROWTH 1 DAY Performed at [facility]    Report Status PENDING  Incomplete  MRSA PCR Screening     Status: None   Collection Time: 04/04/2015  8:23 PM  Result Value Ref Range Status   MRSA by PCR NEGATIVE NEGATIVE Final    Comment:        The GeneXpert MRSA Assay (FDA approved for NASAL specimens only), is one component of a comprehensive MRSA colonization surveillance program. It is not intended to diagnose MRSA infection nor to guide or monitor treatment for MRSA infections.      Studies: Dg Chest 2 View  03/28/2015   CLINICAL DATA:  Shortness of breath.  Chest pain.  EXAM: CHEST  2 VIEW  COMPARISON:  11/25/2014  FINDINGS: Heart size and pulmonary vascularity are normal and the lungs are clear. No effusions. CABG. No acute osseous abnormality.  IMPRESSION: No active cardiopulmonary disease.   Electronically Signed   By: Lorriane Shire M.D.   On: 04/01/2015 07:59    Scheduled Meds: . amiodarone  100 mg Oral Daily  . aspirin EC  81 mg Oral Daily  . atorvastatin  10 mg Oral q1800  . cinacalcet  60 mg Oral Q supper  . collagenase   Topical Daily  . ferric gluconate (FERRLECIT/NULECIT) IV  62.5 mg Intravenous Weekly  . insulin aspart  0-15 Units Subcutaneous TID WC  . midodrine  10 mg Oral Q1400  . multivitamin  1 tablet Oral Q1400  . piperacillin-tazobactam (ZOSYN)  IV  2.25 g Intravenous 3 times per day  . pregabalin  25 mg  Oral Q1400  . sevelamer carbonate  1,600 mg Oral Q lunch  . sodium chloride  3 mL Intravenous Q12H  . [START ON 04/02/2015] vancomycin  1,000 mg Intravenous Q M,W,F-HD   Continuous Infusions:  Antibiotics Given (last 72 hours)    Date/Time Action Medication Dose Rate   03/20/2015 1742 Given   vancomycin (VANCOCIN) 1,250 mg in sodium chloride 0.9 % 250 mL IVPB 1,250 mg 166.7 mL/hr   04/04/2015 2158 Given   doxycycline (VIBRA-TABS) tablet 100 mg 100 mg    04/05/2015 2158 Given   piperacillin-tazobactam (ZOSYN) IVPB 2.25 g 2.25 g 100 mL/hr   04/01/15 0601 Given   piperacillin-tazobactam (ZOSYN) IVPB 2.25 g 2.25 g 100 mL/hr      Principal Problem:   Wound infection Active Problems:   Leucocytosis   Atrial fibrillation with RVR   Elevated INR (international normalized ratio) due to prior anticoagulant medication ingestion    Time spent: 25 min    [name], [facility] Pager [phone]. If 7PM-7AM, please contact night-coverage at [email or web address], password [facility] 04/01/2015, 12:54 PM  LOS: 1 day

## 2015-04-01 NOTE — Progress Notes (Signed)
Plan discussed with MD: Administer Vit K, Titrate Neo for MAP of 60, once vit K complete administer blood product.  Attempt to obtain second IV access site for blood administration.

## 2015-04-01 NOTE — Progress Notes (Signed)
Patient having nausea and vomiting. Blood pressure 78/42. She feels like is keeps blacking out. 1 black out witnessed, aroused by sternal rub.  Dr. Eliseo Squires made aware and new orders given.

## 2015-04-01 NOTE — Progress Notes (Signed)
The patient's BP was 84/48, which the patient states is close to her baseline.  The patient states that she is asymptomatic at this BP.  K. Schorr was notified.

## 2015-04-01 NOTE — Progress Notes (Signed)
Admit: 03/28/2015 LOS: 1  11F ESRD on iHD with SIRS  Subjective:  HD Yesterday,  1L UF limited by tachycardia, full time Post weight 83.1kg, not standnig MOre awake, energetic this AM WBC remains elevated Having low grade fevers   07/18 0701 - 07/19 0700 In: 990 [P.O.:240; I.V.:700; IV Piggyback:50] Out: 1003   Filed Weights   03/19/2015 1256 03/18/2015 1520 04/08/2015 1957  Weight: 83.915 kg (185 lb) 84.2 kg (185 lb 10 oz) 83.1 kg (183 lb 3.2 oz)    Scheduled Meds: . amiodarone  100 mg Oral Daily  . aspirin EC  81 mg Oral Daily  . atorvastatin  10 mg Oral q1800  . cinacalcet  60 mg Oral Q supper  . collagenase   Topical Daily  . ferric gluconate (FERRLECIT/NULECIT) IV  62.5 mg Intravenous Weekly  . insulin aspart  0-15 Units Subcutaneous TID WC  . midodrine  10 mg Oral Q1400  . multivitamin  1 tablet Oral Q1400  . piperacillin-tazobactam (ZOSYN)  IV  2.25 g Intravenous 3 times per day  . pregabalin  25 mg Oral Q1400  . sevelamer carbonate  1,600 mg Oral Q lunch  . sodium chloride  3 mL Intravenous Q12H  . [START ON 04/02/2015] vancomycin  1,000 mg Intravenous Q M,W,F-HD   Continuous Infusions:  PRN Meds:.acetaminophen **OR** acetaminophen, alum & mag hydroxide-simeth, diphenoxylate-atropine, hydrOXYzine, ondansetron **OR** ondansetron (ZOFRAN) IV, oxyCODONE-acetaminophen, senna-docusate  Current Labs: reviewed  INR up to ~8   Physical Exam:  Blood pressure 84/48, pulse 93, temperature 99.2 F (37.3 C), temperature source Oral, resp. rate 16, height 4\' 8"  (1.422 m), weight 83.1 kg (183 lb 3.2 oz), SpO2 93 %. General: Well developed, well nourished, in no acute distress. Head: Normocephalic, atraumatic, sclera non-icteric, mucus membranes are moist Neck: Supple. JVD not elevated. Lungs: Bilateral breath sounds with few scattered rhonchi which cleared with cough. No WOB.  Heart: Irregular with S1 S2. II/VI systolic M.  Abdomen: Soft, non-tender, non-distended with  normoactive bowel sounds. No rebound/guarding. No obvious abdominal masses. Skin: Warm, dry with decubitus wound R & L buttocks. Nonpurulent drainage. No rashes, pruritus. Lower extremities: R BKA L AKA. Has trace edema RUE, trace to 1+ edema R BKA.  Neuro: Alert and oriented X 3. Moves all extremities spontaneously. Psych: Responds to questions appropriately with a normal affect. Dialysis Access: RFA AVF + Thrill + Bruit  Dialysis Orders: Center: [facility] on MWF . EDW 81 kg HD Bath 3.0 K 2.5 Ca Time 4 hours Heparin 8000 units. Access RFA AVF BFR 400 DFR 800  Calcitriol .25 mcg PO q MWF Venofer 50 mg IV q week with HD  A/P 1. ESRD 1. Keep on MWF Schedule 2. Hold bolus heparin, supratherapeutic INR 3. Using AVF 2. ? Decubitus Wound Infection with leukocytosis and low grade fevers 1. On Vanc/Zosyn 2. Wound GS no bactereia seen 3. BCx and Wound Cx pending 3. Vol/BP 1. Using bed weights 2. Chronic low BP 3. Set UF goal 2L 4. LImited by tachycardia 4. Anemia: stable not on ESA 5. MBD: cotn outpt meds 6. AFib 1. On warfarin, supratherapeutic INR, holding coumadin per TRH 2. No heparin with HD  [name] MD 04/01/2015, 9:43 AM   Recent Labs Lab 04/03/2015 0819 04/01/15 0434  NA 130* 132*  K 4.7 3.5  CL 95* 96*  CO2 23 26  GLUCOSE 61* 52*  BUN 31* 14  CREATININE 4.17* 2.86*  CALCIUM 8.0* 7.4*    Recent Labs Lab 03/25/2015 352 219 0214  04/01/15 0434  WBC 18.5* 19.5*  NEUTROABS 15.9*  --   HGB 14.3 13.1  HCT 43.2 40.3  MCV 84.2 85.2  PLT 137* 138*

## 2015-04-01 NOTE — Progress Notes (Addendum)
Came to evaluate patient as nursing reports that BP is dropping despite bolus NS.  Nursing reports that patient can be difficult to wake up, falls asleep quickly and has not been making sense with her conversations.  Upon my exam SBP about 60 manualy-- able to answer questions correctly.  Spoke with critical care doctor on call, Dr. Ashok Cordia who will send someone to evaluate patient but has ok'd an ICU bed for now  Would like to further bolus patient with IVF-- have ordered 1L.  Patient has elevated INR and only peripheral access currently.  Lactic acid recheck ordered Appreciate critical care's assessment Eulogio Bear DO

## 2015-04-01 NOTE — Progress Notes (Signed)
UR completed.    Rakin Lemelle W. Reef Achterberg, RN, BSN  Trauma/Neuro ICU Case Manager [phone] 

## 2015-04-01 NOTE — Progress Notes (Signed)
Lactic acid 2.3 reported to Dr Ashok Cordia.

## 2015-04-01 NOTE — Progress Notes (Signed)
eLink Physician-Brief Progress Note Patient Name: [name] DOB: [birth year] MRN: [id number]   Date of Service  04/01/2015  HPI/Events of Note  RN notified me of elevated LA. Repeat LA ordered q3hr. Now has 2nd PIV.  eICU Interventions  Continuing to trend LA per plan.     Intervention Category Minor Interventions: Communication with other healthcare providers and/or family  Tera Partridge 04/01/2015, 7:51 PM

## 2015-04-01 NOTE — Significant Event (Signed)
Rapid Response Event Note  Overview: Time Called: 1630 Arrival Time: 1633 Event Type: Hypotension  Initial Focused Assessment: Patient normal BP 80-90s. Now hypotensive 60s.  Afib 105  RR 10  O2 sat 98% on 2L [place]  Temp 97.7 oral Lung sounds clear, heart tones irregular. ESRD, HD yesterday. Per nursing staff patient blacked out this afternoon.  Currently patient falls asleep quickly when not stimulated.  Patient oriented and able to give some history.  When she falls asleep her breathing slows and she has some apneic periods. She states that she feels wiped out, has no energy.  Interventions: Dr Eliseo Squires at bedside to assess patient. CCM consulted 1L NS bolus given Labs drawn Dr Elsworth Soho at bedside to assess patient. Patient transported to 2M04 via bed with O2 and heart monitor  Event Summary: Name of Physician Notified: Eliseo Squires at 1330  Name of Consulting Physician Notified: Elsworth Soho at 1640  Outcome: Transferred (Comment) [phone])  Event End Time: [place]  Raliegh Ip

## 2015-04-01 NOTE — Progress Notes (Signed)
The patient's CBG was 56 this morning.  She was given juice and crackers and the oncoming staff was notified of the patient's CBG.

## 2015-04-01 NOTE — Care Management Note (Signed)
Case Management Note  Patient Details  Name: [name] MRN: [id number] Date of Birth: March 26, 1956  Subjective/Objective:   Admitted with Wound infection                 Action/Plan: Patient lives alone and has home health care with [facility] for HHRN,PT, SW; Per Mickel Baas RN with [facility]- patient lives alone and falls a lot. CM talked to patient about possible skilled placement. Patient is agreeable to go  Stated " my arms are so weak and I have been falling a lot."  PCP - [facility], Medicare Insurance, patient stated that she does not have any problems getting her medication; pharmacy of choice is [facility]. Patient use the Bus / [employer] for transportation. Patient also stated that she has family members that live close by and are very supportive.  Expected Discharge Date:    possible 04/05/2015              Expected Discharge Plan:  Skilled Nursing Facility  In-House Referral:  Clinical Social Work  Discharge planning Services  CM Consult     Choice offered to:  Patient  [place] Agency:  [facility]  Status of Service:  In process, will continue to follow   Sherrilyn Rist [phone] 04/01/2015, 10:28 AM

## 2015-04-01 NOTE — Consult Note (Signed)
PULMONARY / CRITICAL CARE MEDICINE   Name: [name] MRN: [id number] DOB: 03-23-1956    ADMISSION DATE:  03/19/2015 CONSULTATION DATE:  04/01/2015  REFERRING MD :  Eliseo Squires  CHIEF COMPLAINT:  Hypotension  INITIAL PRESENTATION:  59 y.o. F with ESRD (MWF) admitted to [facility] 7/18 with leukocytosis and elevated lactate.  On 7/19, had persistent hypotension despite fluid boluses; therefore, PCCM called for ICU transfer.  STUDIES:  718 >>> no acute process.  SIGNIFICANT EVENTS: 7/18 - admit. 7/19 - transfer to ICU for hypotension not responding to IVF boluses.  Given 5mg  vit K and 2u FFP for INR 8.24.   HISTORY OF PRESENT ILLNESS:   [name] is a 59 y.o. F with PMH as outlined below including ESRD (MWF) and chronic hypotension on midodrine.  She was at dialysis on Monday 7/18 and was found to have rapid heart rate and palpitations; therefore was sent to ED where she ended up being admitted.  She was found to have A.fib with RVR and also reported mild SOB. She also had leukocytosis and elevated lactic acid and admitted to having a chronic sacral ulcer that is cared for by home health RN.  On 7/19, she had persistent hypotension with SBP in the 60's despite saline boluses.  She had minimally decreased level of consciousness therefore ICU transfer was requested.  Of note, INR elevated at 8.24.  She denies any fevers/chills/sweats, chest pain, SOB, cough, N/V/D, abd pain, myalgias.  She is awake but is slightly drowsy (awakens to voice and able to participate in interview appropriately).   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; Laser retinal surgery (2011); Kidney transplant; Abdominal angiogram (Left, 01/25/13); Colonoscopy; Cardiac catheterization; Femoral-popliteal Bypass Graft (Left, 02/20/2013); Amputation (Left, 03/27/2013); TEE without cardioversion (N/A, 09/11/2013); Cardioversion (N/A, 09/11/2013); Below knee leg amputation (Right); Revison of arteriovenous fistula (Right, 11/21/2013); cataract surgery (Right, 01-07-2014); lower extremity angiogram (Left, 01/25/2013); Colonoscopy with propofol (N/A, 12/12/2014); Eye surgery; Eye surgery (Left, November 28, 2014); I&D extremity (Right, 01/30/2015); and Amputation (Right, 03/06/2015). Prior to Admission medications   Medication Sig Start Date End Date Taking? Authorizing Provider  acetaminophen (TYLENOL) 325 MG tablet Take 650 mg by mouth every 6 (six) hours as needed.   Yes Historical Provider, MD  amiodarone (PACERONE) 200 MG tablet Take 1 tablet (200 mg total) by mouth daily. Patient taking differently: Take 100 mg by mouth daily.  01/14/14  Yes Belva Crome, MD  aspirin EC 81 MG tablet Take 81 mg by mouth daily. 09/18/13  Yes Debbe Odea, MD  atorvastatin (LIPITOR) 10 MG tablet Take 10 mg by mouth daily at 2 PM daily at 2 PM.    Yes Historical Provider, MD  B Complex-C (SUPER B COMPLEX PO) Take 1 tablet by mouth daily at 2 PM daily at 2 PM.  Yes Historical Provider, MD  B Complex-C-Folic Acid (DIALYVITE TABLET) TABS Take 1 tablet by  mouth daily at 2 PM daily at 2 PM.  10/17/14  Yes Historical Provider, MD  cinacalcet (SENSIPAR) 60 MG tablet Take 60 mg by mouth daily at 2 PM daily at 2 PM.    Yes Historical Provider, MD  diphenoxylate-atropine (LOMOTIL) 2.5-0.025 MG per tablet Take by mouth 4 (four) times daily as needed for diarrhea or loose stools.   Yes Historical Provider, MD  hydrOXYzine (ATARAX/VISTARIL) 25 MG tablet Take 25 mg by mouth every 6 (six) hours as needed for itching.  08/20/13  Yes Historical Provider, MD  insulin aspart (NOVOLOG FLEXPEN) 100 UNIT/ML FlexPen Inject 2-15 Units into the skin 3 (three) times daily with meals. Per sliding scale   Yes Historical Provider, MD  midodrine (PROAMATINE) 10 MG tablet Take 10 mg by mouth daily at 2 PM daily at 2 PM.    Yes Historical Provider, MD  pregabalin (LYRICA) 25 MG capsule Take 25 mg by mouth daily at 2 PM daily at 2 PM.  05/23/13  Yes Mauricia Area, MD  sevelamer carbonate (RENVELA) 800 MG tablet Take 1,600 mg by mouth daily with lunch.    Yes Historical Provider, MD  warfarin (COUMADIN) 4 MG tablet Take 2-4 mg by mouth See admin instructions. Take 4mg  Mon-Wed-Frid  And  Take 2mg  Sun-Tue-Thur- Sat. 12/06/14  Yes Historical Provider, MD  doxycycline (VIBRA-TABS) 100 MG tablet Take 100 mg by mouth 2 (two) times daily. 03/25/15   Historical Provider, MD   Allergies  Allergen Reactions  . Neurontin [Gabapentin] Anaphylaxis  . Ezetimibe-Simvastatin Itching and Swelling     facial edema  . Klonopin [Clonazepam] Other (See Comments)    hallucination  . Sertraline Hcl Other (See Comments)    hallucination    FAMILY HISTORY:  Family History  Problem Relation Age of Onset  . Heart disease    . Edema Father     fluid overload  . Diabetes Father   . Cerebral aneurysm Mother   . Diabetes Mother   . Stroke Brother   . Heart disease Brother   . Diabetes Brother   . Hyperlipidemia Brother   . Hypertension Brother   . Peripheral vascular disease Brother      amputation    SOCIAL HISTORY:  reports that she has never smoked. She has quit using smokeless tobacco. Her smokeless tobacco use included Snuff and Chew. She reports that she does not drink alcohol or use illicit drugs.  REVIEW OF SYSTEMS:  All negative; except for those that are bolded, which indicate positives.  Constitutional: weight loss, weight gain, night sweats, fevers, chills, fatigue, weakness.  HEENT: headaches, sore throat, sneezing, nasal congestion, post nasal drip, difficulty swallowing, tooth/dental problems, visual complaints, visual changes, ear aches. Neuro: difficulty with speech, weakness, numbness, ataxia. CV:  chest pain, orthopnea, PND, swelling in lower extremities, dizziness, palpitations, syncope.  Resp: cough, hemoptysis, dyspnea, wheezing. GI  heartburn, indigestion, abdominal pain, nausea, vomiting, diarrhea, constipation, change in bowel habits, loss of appetite, hematemesis, melena, hematochezia.  GU: dysuria, change in color of urine, urgency or frequency, flank pain, hematuria. MSK: joint pain or swelling, decreased range of motion. Psych: change in mood or affect, depression, anxiety, suicidal ideations, homicidal ideations. Skin: rash, itching, bruising.   SUBJECTIVE:  Denies chest pain, SOB.  Drowsy but responds to voice easily and participates in full interview appropriately.  VITAL SIGNS: Temp:  [97.7 F (36.5 C)-100.1 F (37.8 C)] 97.7  F (36.5 C) (07/19 1510) Pulse Rate:  [62-133] 93 (07/19 1703) Resp:  [10-19] 10 (07/19 1703) BP: (63-108)/(37-80) 65/37 mmHg (07/19 1703) SpO2:  [93 %-99 %] 98 % (07/19 1703) Weight:  [83.1 kg (183 lb 3.2 oz)] 83.1 kg (183 lb 3.2 oz) (07/18 1957) HEMODYNAMICS:   VENTILATOR SETTINGS:   INTAKE / OUTPUT: Intake/Output      07/18 0701 - 07/19 0700 07/19 0701 - 07/20 0700   P.O. 240 480   I.V. (mL/kg) 700 (8.4)    IV Piggyback 50 50   Total Intake(mL/kg) 990 (11.9) 530 (6.4)   Urine (mL/kg/hr) 0    Other  1003    Total Output 1003     Net -13 +530          PHYSICAL EXAMINATION: General: Chronically ill female, in NAD. Neuro: Drowsy but awakens to voice easily.  Once aroused, answers questions appropriately. HEENT: [place]/AT. PERRL, sclerae anicteric. Cardiovascular: RRR, no M/R/G.  Lungs: Respirations even and unlabored.  CTA bilaterally, No W/R/R. Abdomen: Multiple old surgical scars noted.  Abd soft, NT/ND.  BS x 4. Musculoskeletal: Left AKA, right BKA, right middle finger amputation. Skin: Intact, warm, no rashes.  LABS:  CBC  Recent Labs Lab 03/15/2015 0819 04/01/15 0434  WBC 18.5* 19.5*  HGB 14.3 13.1  HCT 43.2 40.3  PLT 137* 138*   Coag's  Recent Labs Lab 03/20/2015 0820 04/01/15 0434  INR 6.97* 8.24*   BMET  Recent Labs Lab 04/01/2015 0819 04/01/15 0434  NA 130* 132*  K 4.7 3.5  CL 95* 96*  CO2 23 26  BUN 31* 14  CREATININE 4.17* 2.86*  GLUCOSE 61* 52*   Electrolytes  Recent Labs Lab 03/16/2015 0819 04/01/15 0434  CALCIUM 8.0* 7.4*   Sepsis Markers  Recent Labs Lab 03/20/2015 1025  LATICACIDVEN 2.35*   ABG No results for input(s): PHART, PCO2ART, PO2ART in the last 168 hours. Liver Enzymes No results for input(s): AST, ALT, ALKPHOS, BILITOT, ALBUMIN in the last 168 hours. Cardiac Enzymes  Recent Labs Lab 03/30/2015 0819  TROPONINI 0.05*   Glucose  Recent Labs Lab 03/30/2015 2110 03/27/2015 2306 04/01/15 0656 04/01/15 0741 04/01/15 1129 04/01/15 1539  GLUCAP 50* 76 56* 76 104* 105*    Imaging No results found.   ASSESSMENT / PLAN:  CARDIOVASCULAR A:  Acute on chronic hypotension - per pt, SBP usually runs around 80's on midodrine (currently in 60's). Hx HLD, PVD s/p b/l LE amputations and right middle finger amputation, sCHF (last echo from 2014 with EF 35-40%), A.fib on chronic coumadin, CAD. P:  Neosynephrine PRN for goal SBP > 90. Trend lactate. Continue outpatient ASA, amiodarone, atorvastatin, midodrine. Hold outpatient  warfarin given coagulopathy.  PULMONARY A: Acute hypoxic respiratory failure. OSA - on nocturnal CPAP. P:   Continue supplemental O2 as needed to maintain SpO2 > 92%. Continue nocturnal CPAP.  RENAL A:   ESRD - M/W/F; did not fully complete dialysis Mon 7/18. Mild hyponatremia. P:   Nephrology following. Send serum osmolality. No urine studies given anuric state. BMP in AM.  GASTROINTESTINAL A:   Nutrition. P:   Renal diet.  HEMATOLOGIC A:   Coumadin induced coagulopathy. Thrombocytopenia. P:  5g Vitamin K and 2u FFP now. Monitor platelet counts. CBC in AM.  INFECTIOUS A:   Sepsis - unclear etiology; ? Sacral decub ulcer. P:   BCx2 7/18 >>> Wound Cx 7/18 >>> Abx: Vanc, start date 7/18, day 2/x. Abx: Zosyn, start date 7/18, day 2/x. Wound care team  following.  ENDOCRINE A:   DM.  Thyroid disease per history - not on meds. P:   SSI. Check TSH.  NEUROLOGIC A:   Acute encephalopathy - likely due to hypotension. Hx Neuropathy, TIA. P:   Continue lyrica.   Family updated: None.  Interdisciplinary Family Meeting v Palliative Care Meeting:  Due by: 7/26.   Montey Hora, [facility] Pager: [phone]  or [phone] 04/01/2015, 5:29 PM

## 2015-04-01 NOTE — Consult Note (Signed)
WOC wound consult note Reason for Consult: Consult requested for buttocks wounds, pt states "they have been there awhile and home health takes care of them." Wound type: Left buttock unstageable pressure injury: 3X3cm, 70% red, 30% yellow slough, mod amt tan drainage, no odor. Right buttock unstageable pressure injury: 3X3.5cm, 90% slough, 10% red, mod amt tan drainage, no odor. Pressure Ulcer POA: Yes Dressing procedure/placement/frequency: Santyl to chemically debride nonviable tissue.  Air mattress to reduce pressure to affected areas.  Discussed plan of care with patient and she verbalizes understanding. Please re-consult if further assistance is needed.  Thank-you,  Julien Girt MSN, [place], [place], [place], [place]

## 2015-04-01 NOTE — Progress Notes (Signed)
PT Cancellation Note  Patient Details Name: [name] MRN: [id number] DOB: 08/15/1956   Cancelled Treatment:    Reason Eval/Treat Not Completed: Medical issues which prohibited therapy (current INR >8)   [name] 04/01/2015, 1:11 PM Elwyn Reach, [place]

## 2015-04-01 NOTE — Progress Notes (Signed)
Called by nursing as patient's BP low- 78/42- patient c/o feeling like she may black out -will get lactic acid -bolus 250 NS -move to SDU   Eulogio Bear

## 2015-04-02 DIAGNOSIS — Z992 Dependence on renal dialysis: Secondary | ICD-10-CM

## 2015-04-02 DIAGNOSIS — N186 End stage renal disease: Secondary | ICD-10-CM

## 2015-04-02 LAB — CBC
HCT: 43.7 % (ref 36.0–46.0)
HEMATOCRIT: 43.8 % (ref 36.0–46.0)
HEMOGLOBIN: 14.2 g/dL (ref 12.0–15.0)
Hemoglobin: 14.4 g/dL (ref 12.0–15.0)
MCH: 27.7 pg (ref 26.0–34.0)
MCH: 28 pg (ref 26.0–34.0)
MCHC: 32.4 g/dL (ref 30.0–36.0)
MCHC: 33 g/dL (ref 30.0–36.0)
MCV: 85.5 fL (ref 78.0–100.0)
MCV: 85 fL (ref 78.0–100.0)
PLATELETS: 214 10*3/uL (ref 150–400)
Platelets: 221 10*3/uL (ref 150–400)
RBC: 5.12 MIL/uL — ABNORMAL HIGH (ref 3.87–5.11)
RBC: 5.14 MIL/uL — ABNORMAL HIGH (ref 3.87–5.11)
RDW: 16.6 % — ABNORMAL HIGH (ref 11.5–15.5)
RDW: 16.8 % — AB (ref 11.5–15.5)
WBC: 25.9 10*3/uL — AB (ref 4.0–10.5)
WBC: 27.9 10*3/uL — ABNORMAL HIGH (ref 4.0–10.5)

## 2015-04-02 LAB — PREPARE FRESH FROZEN PLASMA
Unit division: 0
Unit division: 0
Unit division: 0

## 2015-04-02 LAB — BASIC METABOLIC PANEL
Anion gap: 12 (ref 5–15)
BUN: 20 mg/dL (ref 6–20)
CALCIUM: 7.9 mg/dL — AB (ref 8.9–10.3)
CO2: 27 mmol/L (ref 22–32)
Chloride: 93 mmol/L — ABNORMAL LOW (ref 101–111)
Creatinine, Ser: 3.22 mg/dL — ABNORMAL HIGH (ref 0.44–1.00)
GFR calc non Af Amer: 15 mL/min — ABNORMAL LOW (ref >60)
GFR, EST AFRICAN AMERICAN: 17 mL/min — AB (ref >60)
Glucose, Bld: 79 mg/dL (ref 65–99)
POTASSIUM: 3.6 mmol/L (ref 3.5–5.1)
Sodium: 132 mmol/L — ABNORMAL LOW (ref 135–145)

## 2015-04-02 LAB — GLUCOSE, CAPILLARY
GLUCOSE-CAPILLARY: 60 mg/dL — AB (ref 65–99)
Glucose-Capillary: 74 mg/dL (ref 65–99)
Glucose-Capillary: 95 mg/dL (ref 65–99)
Glucose-Capillary: 111 mg/dL — ABNORMAL HIGH (ref 65–99)
Glucose-Capillary: 111 mg/dL — ABNORMAL HIGH (ref 65–99)

## 2015-04-02 LAB — RENAL FUNCTION PANEL
ALBUMIN: 1.3 g/dL — AB (ref 3.5–5.0)
ANION GAP: 12 (ref 5–15)
BUN: 23 mg/dL — ABNORMAL HIGH (ref 6–20)
CHLORIDE: 96 mmol/L — AB (ref 101–111)
CO2: 24 mmol/L (ref 22–32)
Calcium: 7.6 mg/dL — ABNORMAL LOW (ref 8.9–10.3)
Creatinine, Ser: 3.44 mg/dL — ABNORMAL HIGH (ref 0.44–1.00)
GFR calc Af Amer: 16 mL/min — ABNORMAL LOW (ref >60)
GFR calc non Af Amer: 13 mL/min — ABNORMAL LOW (ref >60)
Glucose, Bld: 65 mg/dL (ref 65–99)
PHOSPHORUS: 4.8 mg/dL — AB (ref 2.5–4.6)
Potassium: 4.2 mmol/L (ref 3.5–5.1)
SODIUM: 132 mmol/L — AB (ref 135–145)

## 2015-04-02 LAB — PROTIME-INR
INR: 1.83 — ABNORMAL HIGH (ref 0.00–1.49)
Prothrombin Time: 21.1 seconds — ABNORMAL HIGH (ref 11.6–15.2)

## 2015-04-02 LAB — CORTISOL: Cortisol, Plasma: 25.5 ug/dL

## 2015-04-02 LAB — MAGNESIUM: Magnesium: 1.7 mg/dL (ref 1.7–2.4)

## 2015-04-02 LAB — PHOSPHORUS: Phosphorus: 4.9 mg/dL — ABNORMAL HIGH (ref 2.5–4.6)

## 2015-04-02 MED ORDER — HYDROCORTISONE NA SUCCINATE PF 100 MG IJ SOLR
50.0000 mg | Freq: Four times a day (QID) | INTRAMUSCULAR | Status: DC
  Administered 2015-04-02: 50 mg via INTRAVENOUS
  Filled 2015-04-02: qty 2

## 2015-04-02 MED ORDER — WARFARIN - PHARMACIST DOSING INPATIENT: Freq: Every day | Status: DC

## 2015-04-02 NOTE — Evaluation (Signed)
Physical Therapy Evaluation Patient Details Name: [name] MRN: [id number] DOB: October 01, 1955 Today's Date: 04/02/2015   History of Present Illness  Pt is a 59 y/o female with ESRD (MWF) who was admitted to [facility] 7/18 for leukocytosis and elevated lactate. On 7/19 she had persistent hypotension with SBP in the 60's despite boluses. She had a decreased level of consciousness and was transferred to ICU. Pt has a PMH of L AKA and R BKA, and also has a sacral ulcer that is currently being treated by a home health RN.   Clinical Impression  Pt admitted with above diagnosis. Pt currently with functional limitations due to the deficits listed below (see PT Problem List). At the time of PT eval pt was able to perform transfers to/from EOB with +2 assist and increased support for seated balance. Per pt report she was independent with ADL's and slide board transfers to/from her wheelchair. She does not appear open to SNF however was agreeable to a CIR consult and admits that she has had a functional decline. Feel this pt is a good candidate for a CIR stay to improve her function back to mod I. Anticipate good progress as hypotension resolves. Pt will benefit from skilled PT to increase their independence and safety with mobility to allow discharge to the venue listed below.       Follow Up Recommendations CIR;Supervision/Assistance - 24 hour    Equipment Recommendations  None recommended by PT    Recommendations for Other Services Rehab consult     Precautions / Restrictions Precautions Precautions: Fall Restrictions Weight Bearing Restrictions: No Other Position/Activity Restrictions: L AKA, R BKA. Pt had prosthetics that she states she was using however started giving her blisters and she stopped wearing them and did not have a refitting.       Mobility  Bed Mobility Overal bed mobility: Needs Assistance;+2 for physical assistance Bed Mobility: Rolling;Supine to Sit;Sit to Sidelying Rolling:  Mod assist   Supine to sit: Total assist;+2 for physical assistance   Sit to sidelying: Mod assist General bed mobility comments: Pt had difficulty initiating roll to the L due to inability to reach with the RUE. Total assist was required to achieve hip flexion and trunk elevation to full sitting position. Pt required positioning of a therapist in front of her as she is at increased risk of sliding forward during transitions. Pt did well controlling trunk to rest on L elbow to transition to sidelying position however required assist for proper LUE positioning.  Transfers                    Ambulation/Gait                Stairs            Wheelchair Mobility    Modified Rankin (Stroke Patients Only)       Balance Overall balance assessment: Needs assistance Sitting-balance support: Feet unsupported;Bilateral upper extremity supported Sitting balance-Leahy Scale: Zero Sitting balance - Comments: Pt required max assist to maintain seated balance at this time.  Postural control: Posterior lean                                   Pertinent Vitals/Pain Pain Assessment: 0-10 Pain Score: 6  Pain Location: R hand and distal 1/3 of forearm. 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 Pain Descriptors / Indicators: Guarding Pain Intervention(s): Limited activity within  patient's tolerance;Monitored during session;Repositioned    Home Living Family/patient expects to be discharged to:: Inpatient rehab Living Arrangements: Alone Available Help at Discharge: Family;Available PRN/intermittently Type of Home: Apartment Home Access: Level entry     Home Layout: One level Home Equipment: Wheelchair - manual;Tub bench;Other (comment) (Slide board)      Prior Function Level of Independence: Independent with assistive device(s)         Comments: Pt states she was independent PTA, doing her own ADL's (unsure how well she was completing tasks -  information was confusing). Pt states she was transferring to a w/c with a slide board without assist.      Hand Dominance   Dominant Hand: Right    Extremity/Trunk Assessment   Upper Extremity Assessment: Defer to OT evaluation           Lower Extremity Assessment: RLE deficits/detail;LLE deficits/detail RLE Deficits / Details: Pt noted some pain in R residual limb once mobility began. It appears that pt has not been moving much in the bed and may be experiencing some stiffness in LE joints.    Cervical / Trunk Assessment: Other exceptions  Communication   Communication: No difficulties  Cognition Arousal/Alertness: Awake/alert Behavior During Therapy: WFL for tasks assessed/performed Overall Cognitive Status: No family/caregiver present to determine baseline cognitive functioning (Pt providing confusing info at times)                      General Comments      Exercises        Assessment/Plan    PT Assessment Patient needs continued PT services  PT Diagnosis Generalized weakness   PT Problem List Decreased strength;Decreased range of motion;Decreased activity tolerance;Decreased balance;Decreased mobility;Decreased knowledge of use of DME;Decreased safety awareness;Decreased knowledge of precautions;Pain  PT Treatment Interventions DME instruction;Functional mobility training;Therapeutic activities;Therapeutic exercise;Neuromuscular re-education;Patient/family education;Wheelchair mobility training   PT Goals (Current goals can be found in the Care Plan section) Acute Rehab PT Goals Patient Stated Goal: Get back to PLOF PT Goal Formulation: With patient Time For Goal Achievement: 04/09/15 Potential to Achieve Goals: Good    Frequency Min 3X/week   Barriers to discharge Decreased caregiver support      Co-evaluation PT/OT/SLP Co-Evaluation/Treatment: Yes Reason for Co-Treatment: Complexity of the patient's impairments (multi-system involvement);For  patient/therapist safety PT goals addressed during session: Mobility/safety with mobility;Balance         End of Session Equipment Utilized During Treatment: Oxygen Activity Tolerance: Patient limited by fatigue Patient left: in bed;with call bell/phone within reach;with nursing/sitter in room Nurse Communication: Mobility status;Need for lift equipment         Time: 3382-5053 PT Time Calculation (min) (ACUTE ONLY): 24 min   Charges:   PT Evaluation $Initial PT Evaluation Tier I: 1 Procedure     PT G Codes:        Rolinda Roan 04-25-15, 2:03 PM  Rolinda Roan, PT, DPT Acute Rehabilitation Services Pager: [phone]

## 2015-04-02 NOTE — Progress Notes (Signed)
Admit: 04/02/2015 LOS: 2  72F ESRD on iHD with SIRS  Subjective:  Worsened hypotension overnight, didn't respond to bolus IVF To MICU for pheny gtt Pt AAO, low BP this AM.   K WNL, on 4L [place], lying flat AVF with B/T   07/19 0701 - 07/20 0700 In: 1838.3 [P.O.:480; I.V.:575.3; Blood:583; IV Piggyback:200] Out: -   Filed Weights   03/28/2015 1256 04/03/2015 1520 03/24/2015 1957  Weight: 83.915 kg (185 lb) 84.2 kg (185 lb 10 oz) 83.1 kg (183 lb 3.2 oz)    Scheduled Meds: . sodium chloride   Intravenous Once  . amiodarone  100 mg Oral Daily  . antiseptic oral rinse  7 mL Mouth Rinse BID  . aspirin EC  81 mg Oral Daily  . atorvastatin  10 mg Oral q1800  . cinacalcet  60 mg Oral Q supper  . collagenase   Topical Daily  . ferric gluconate (FERRLECIT/NULECIT) IV  62.5 mg Intravenous Weekly  . insulin aspart  0-15 Units Subcutaneous TID WC  . midodrine  10 mg Oral TID WC  . multivitamin  1 tablet Oral Q1400  . piperacillin-tazobactam (ZOSYN)  IV  2.25 g Intravenous 3 times per day  . pregabalin  25 mg Oral Q1400  . sevelamer carbonate  1,600 mg Oral Q lunch  . sodium chloride  3 mL Intravenous Q12H  . vancomycin  1,000 mg Intravenous Q M,W,F-HD   Continuous Infusions: . phenylephrine (NEO-SYNEPHRINE) Adult infusion 30 mcg/min (04/02/15 0800)   PRN Meds:.acetaminophen **OR** acetaminophen, alum & mag hydroxide-simeth, diphenoxylate-atropine, hydrOXYzine, ondansetron **OR** ondansetron (ZOFRAN) IV, senna-docusate  Current Labs: reviewed  INR up to ~8   Physical Exam:  Blood pressure 85/49, pulse 91, temperature 98.1 F (36.7 C), temperature source Oral, resp. rate 11, height 4\' 8"  (1.422 m), weight 83.1 kg (183 lb 3.2 oz), SpO2 92 %. GEN: NAD CV: RRR no rub AVF: +B/T LUNGS: CTAB  Dialysis Orders: Center: [facility] on MWF . EDW 81 kg HD Bath 3.0 K 2.5 Ca Time 4 hours Heparin 8000 units. Access RFA AVF BFR 400 DFR 800  Calcitriol .25 mcg PO q MWF Venofer 50 mg IV q week with  HD  A/P 1. ESRD 1. MWF as outpt, hold today 2/2 low BP no strong indication for HD 2. Hold bolus heparin, supratherapeutic INR 3. Using AVF; if HD needed might be CRRT and would req HD Catheter 2. ? Decubitus Wound Infection with leukocytosis and low grade fevers 1. On Vanc/Zosyn, WBC worsened 2. Wound GS no bactereia seen 3. BCx and Wound Cx pending 4. Per CCM 3. Vol/BP and hypotension on phenyl gtt / SIRS 1. Using bed weights 2. Chronic low BP 3. No HD today, on midodrine, might need albumin with HD 4. Anemia: stable not on ESA 5. MBD: cotn outpt meds 6. AFib 1. On warfarin, presented with supratherapeutic INR, holding coumadin per TRH 2. No heparin with HD  [name] MD 04/02/2015, 8:39 AM   Recent Labs Lab 03/20/2015 0819 04/01/15 0434 04/02/15 0225  NA 130* 132* 132*  K 4.7 3.5 3.6  CL 95* 96* 93*  CO2 23 26 27   GLUCOSE 61* 52* 79  BUN 31* 14 20  CREATININE 4.17* 2.86* 3.22*  CALCIUM 8.0* 7.4* 7.9*  PHOS  --   --  4.9*    Recent Labs Lab 03/19/2015 0819 04/01/15 0434 04/02/15 0225  WBC 18.5* 19.5* 25.9*  NEUTROABS 15.9*  --   --   HGB 14.3 13.1 14.2  HCT 43.2 40.3 43.8  MCV 84.2 85.2 85.5  PLT 137* 138* 221

## 2015-04-02 NOTE — Progress Notes (Signed)
Hypoglycemic Event  CBG:60  Treatment: 15 GM carbohydrate snack  Symptoms: None  Follow-up CBG: MIWO:0321 CBG Result:79  Possible Reasons for Event not eating  Comments/MD notified:no    Lunette Stands  Remember to initiate Hypoglycemia Order Set & complete

## 2015-04-02 NOTE — Progress Notes (Signed)
ANTICOAGULATION CONSULT NOTE - Follow Up Consult  Pharmacy Consult for warfarin Indication: atrial fibrillation  Allergies  Allergen Reactions  . Neurontin [Gabapentin] Anaphylaxis  . Ezetimibe-Simvastatin Itching and Swelling     facial edema  . Klonopin [Clonazepam] Other (See Comments)    hallucination  . Sertraline Hcl Other (See Comments)    hallucination    Patient Measurements: Height: 4\' 8"  (142.2 cm) Weight: 183 lb 3.2 oz (83.1 kg) IBW/kg (Calculated) : 36.3   Vital Signs: Temp: 98.1 F (36.7 C) (07/20 0810) Temp Source: Oral (07/20 0810) BP: 86/51 mmHg (07/20 1200) Pulse Rate: 94 (07/20 1200)  Labs:  Recent Labs  04/13/2015 0819 04/05/2015 0820 04/01/15 0434 04/02/15 0225 04/02/15 0927  HGB 14.3  --  13.1 14.2 14.4  HCT 43.2  --  40.3 43.8 43.7  PLT 137*  --  138* 221 214  LABPROT  --  57.7* 65.4* 21.1*  --   INR  --  6.97* 8.24* 1.83*  --   CREATININE 4.17*  --  2.86* 3.22* 3.44*  TROPONINI 0.05*  --   --   --   --     Estimated Creatinine Clearance: 15.1 mL/min (by C-G formula based on Cr of 3.44).   Assessment: 59 year old female admitted from hemodialysis with tachycardia. She is on chronic anticoagulation with Coumadin for atrial fibrillation. Her home regimen is Warfarin 4mg  MWF and Warfarin 2mg  TTSS. Her INR was supratherapeutic at 8.24 on 7/19, she was given 5mg  Vit K and 2 units FFP. Current INR is 1.83.  H/H wnl, plt wnl. No bleeding reported.  Goal of Therapy:  INR 2-3 Monitor platelets by anticoagulation protocol: Yes   Plan:  Continue to hold warfarin per CCM, re-evaluate tomorrow. Monitor for s/sx of bleeding, daily INR.  Dimitri Ped, PharmD. Clinical Pharmacist Resident Pager: [phone]

## 2015-04-02 NOTE — Evaluation (Addendum)
Occupational Therapy Evaluation Patient Details Name: [name] MRN: [id number] DOB: [birth year] Today's Date: 04/02/2015    History of Present Illness Pt is a 59 y.o. female with ESRD (MWF) who was admitted to [facility] 7/18 for leukocytosis and elevated lactate. On 7/19 she had persistent hypotension with SBP in the 60's despite boluses. She had a decreased level of consciousness and was transferred to ICU. Pt has a PMH of L AKA and R BKA, and also has a sacral ulcer that is currently being treated by a home health RN.    Clinical Impression   Unsure of pt's accurate PLOF. Feel pt will benefit form acute OT to increase independence, strength, and activity tolerance prior to d/c. Recommending CIR for rehab and feel pt is a good candidate.     Follow Up Recommendations  CIR    Equipment Recommendations  Other (comment) (TBD)    Recommendations for Other Services Rehab consult     Precautions / Restrictions Precautions Precautions: Fall Restrictions Weight Bearing Restrictions: No Other Position/Activity Restrictions: L AKA, R BKA. Pt had prosthetics that she states she was using however started giving her blisters and she stopped wearing them and did not have a refitting.       Mobility Bed Mobility Overal bed mobility: Needs Assistance;+2 for physical assistance Bed Mobility: Rolling;Supine to Sit;Sit to Sidelying Rolling: Mod assist   Supine to sit: Total assist;+2 for physical assistance   Sit to sidelying: Mod assist General bed mobility comments: Pt had difficulty initiating roll to the L due to inability to reach with the RUE. Total assist was required to achieve hip flexion and trunk elevation to full sitting position. Pt required positioning of a therapist in front of her as she is at increased risk of sliding forward during transitions. Pt did well controlling trunk to rest on L elbow to transition to sidelying position however required assist for proper LUE  positioning.  Transfers                      Balance Overall balance assessment: Needs assistance Sitting-balance support: Feet unsupported;Bilateral upper extremity supported Sitting balance-Leahy Scale: Zero Sitting balance - Comments: Pt required max assist to maintain seated balance at this time.  Postural control: Posterior lean                                  ADL Overall ADL's : Needs assistance/impaired             Lower Body Bathing: Maximal assistance;Bed level   Upper Body Dressing : Bed level;Moderate assistance   Lower Body Dressing: Maximal assistance;Bed level       Toileting- Clothing Manipulation and Hygiene: Maximal assistance;Bed level               Vision     Perception     Praxis      Pertinent Vitals/Pain Pain Assessment: 0-10 Pain Score:  (5-6) Pain Location: third digit on right hand, distal part of forearm, and RLE Pain Descriptors / Indicators: Sore Pain Intervention(s): Repositioned;Monitored during session  HR up to 120 in session.     Hand Dominance Right   Extremity/Trunk Assessment Upper Extremity Assessment; Generalized weakness Upper Extremity Assessment: RUE deficits/detail RUE Deficits / Details: amputation of distal end of third digit/wrapped   Lower Extremity Assessment Lower Extremity Assessment: Defer to PT evaluation RLE Deficits / Details: Pt noted some pain  in R residual limb once mobility began. It appears that pt has not been moving much in the bed and may be experiencing some stiffness in LE joints.   Cervical / Trunk Assessment Cervical / Trunk Assessment: Other exceptions Cervical / Trunk Exceptions: Rounded shoulders/forward head posture   Communication Communication Communication: No difficulties   Cognition Arousal/Alertness: Awake/alert Behavior During Therapy: WFL for tasks assessed/performed Overall Cognitive Status: No family/caregiver present to determine baseline  cognitive functioning (pt providing confusing information at times)                     General Comments       Exercises       Shoulder Instructions      Home Living Family/patient expects to be discharged to:: Inpatient rehab Living Arrangements: Alone Available Help at Discharge: Family;Available PRN/intermittently Type of Home: Apartment Home Access: Level entry     Home Layout: One level               Home Equipment: Wheelchair - manual;Tub bench;Other (comment);Bedside commode (sliding board)   Additional Comments: sounds like pt has walk-in and tub/shower-unsure of accuracy      Prior Functioning/Environment Level of Independence: Unsure        Comments: Pt states she was independent PTA, doing her own ADL's (unsure how well she was completing tasks - information was confusing). Pt states she was transferring to a w/c with a slide board. Pt was confusing when talking about PLOF, so unsure of accuracy.    OT Diagnosis: Generalized weakness;Acute pain   OT Problem List: Decreased cognition;Decreased knowledge of use of DME or AE;Decreased knowledge of precautions;Pain;Decreased strength;Decreased activity tolerance;Impaired balance (sitting and/or standing)   OT Treatment/Interventions: Self-care/ADL training;DME and/or AE instruction;Therapeutic activities;Patient/family education;Balance training;Cognitive remediation/compensation;Therapeutic exercise;Energy conservation    OT Goals(Current goals can be found in the care plan section) Acute Rehab OT Goals Patient Stated Goal: be independent OT Goal Formulation: With patient Time For Goal Achievement: 04/16/15 Potential to Achieve Goals: Good ADL Goals Pt Will Perform Grooming: with min assist;sitting (sitting EOB) Pt Will Perform Upper Body Bathing: sitting;with min assist (sitting EOB) Pt Will Perform Lower Body Bathing: with min assist;sitting/lateral leans Pt Will Transfer to Toilet: with mod  assist;with transfer board (drop arm commode) Additional ADL Goal #1: Pt will perform bilateral UE exercises to increase strength, at supervision level. Additional ADL Goal #2: Pt will perform bed mobility at [place] assist level as precursor for ADLs.  OT Frequency: Min 2X/week   Barriers to D/C:            Co-evaluation PT/OT/SLP Co-Evaluation/Treatment: Yes Reason for Co-Treatment: Complexity of the patient's impairments (multi-system involvement);For patient/therapist safety PT goals addressed during session: Mobility/safety with mobility;Balance OT goals addressed during session: Other (comment) (mobility/balance)      End of Session Equipment Utilized During Treatment: Oxygen Nurse Communication: Other (comment) (BP)  Activity Tolerance: Patient limited by fatigue Patient left: in bed;with call bell/phone within reach   Time: 2229-7989 OT Time Calculation (min): 23 min Charges:  OT General Charges $OT Visit: 1 Procedure OT Evaluation $Initial OT Evaluation Tier I: 1 Procedure G-CodesBenito Mccreedy OTR/L [id number] 04/02/2015, 2:14 PM

## 2015-04-02 NOTE — Clinical Documentation Improvement (Signed)
Pt admitted 04/13/2015 with acute hypotension, WBC 18.5 and lactic acid 2.35.  Consult note states "have to consider sepsis in the setting - probable source could be skin due to decubitus- agree with IV vancomycin empiric."  Neo-Synephrine started and midodrine continued to minimize dose of pressors.  Please clarify if sepsis was ruled in or ruled out.  Possible Clinical Conditions: -Sepsis, present on admission -Sepsis, developed after admission -Sepsis ruled out  Thank you, Mateo Flow, RN [phone] Clinical Documentation Specialist

## 2015-04-02 NOTE — Consult Note (Signed)
Consult requested for pressure ulcers.  This was performed on 7/19; please refer to progress notes for assessment, measurements, and plan of care which has been provided for the nursing staff to follow. Please re-consult if further assistance is needed.  Thank-you,  Julien Girt MSN, [place], [place], [place], [place]

## 2015-04-02 NOTE — Progress Notes (Signed)
Rehab Admissions Coordinator Note:  Patient was screened by Cleatrice Burke for appropriateness for an Inpatient Acute Rehab Consult per PT and OT. At this time, we are recommending Inpatient Rehab consult when medically appropriate. Pt previously at [facility] 03/2013 and 03/2010.  Cleatrice Burke 04/02/2015, 2:39 PM  I can be reached at [phone].

## 2015-04-02 NOTE — Progress Notes (Signed)
PULMONARY / CRITICAL CARE MEDICINE   Name: [name] MRN: [id number] DOB: 1956/01/26    ADMISSION DATE:  03/20/2015 CONSULTATION DATE:  04/01/15  REFERRING MD :  Eliseo Squires  CHIEF COMPLAINT:  Hypotension, Sepsis secondary to Decubitus Ulcers  INITIAL PRESENTATION:  59 y.o. F with ESRD (MWF) admitted to [facility] 7/18 with leukocytosis and elevated lactate. On 7/19, had persistent hypotension despite fluid boluses; therefore, PCCM called for ICU transfer.  STUDIES:  7/18 CXR >> No Acute Cardiopulmonary Disease  SIGNIFICANT EVENTS: 7/18 - admit. 7/19 - transfer to ICU for hypotension not responding to IVF boluses. Given 5mg  vit K and 2u FFP for INR 8.24.  SUBJECTIVE:  No acute events overnight. States she is feeling better since being transferred to ICU. Denies shortness of breath. Complains of cough.   VITAL SIGNS: Temp:  [97.6 F (36.4 C)-98.6 F (37 C)] 98.3 F (36.8 C) (07/20 0409) Pulse Rate:  [25-103] 98 (07/20 0715) Resp:  [0-32] 14 (07/20 0715) BP: (60-107)/(37-89) 90/60 mmHg (07/20 0715) SpO2:  [92 %-100 %] 95 % (07/20 0715) HEMODYNAMICS:   VENTILATOR SETTINGS:   INTAKE / OUTPUT:  Intake/Output Summary (Last 24 hours) at 04/02/15 0808 Last data filed at 04/02/15 0700  Gross per 24 hour  Intake 1838.25 ml  Output      0 ml  Net 1838.25 ml   PHYSICAL EXAMINATION: General:  59yo female resting comfortably in no apparent distress Neuro:  No focal deficits, alert HEENT:  MMM, PERRLA Cardiovascular:  S1 and S2 noted, no murmurs, regular rate and rhythm Lungs:  Clear to auscultation bilaterally, equal breath sounds, no wheezes Abdomen:  Soft and nondistended, bowel sounds noted, mild diffuse tenderness to palpation Musculoskeletal:  Left AKA, right BKA, right middle finger amputated Skin:  Unstageable decubitus sacral ulcers bilateral x2 (one on right and one on left)  LABS:  CBC  Recent Labs Lab 03/23/2015 0819 04/01/15 0434 04/02/15 0225  WBC 18.5* 19.5*  25.9*  HGB 14.3 13.1 14.2  HCT 43.2 40.3 43.8  PLT 137* 138* 221   Coag's  Recent Labs Lab 03/26/2015 0820 04/01/15 0434 04/02/15 0225  INR 6.97* 8.24* 1.83*   BMET  Recent Labs Lab 03/23/2015 0819 04/01/15 0434 04/02/15 0225  NA 130* 132* 132*  K 4.7 3.5 3.6  CL 95* 96* 93*  CO2 23 26 27   BUN 31* 14 20  CREATININE 4.17* 2.86* 3.22*  GLUCOSE 61* 52* 79   Electrolytes  Recent Labs Lab 04/01/2015 0819 04/01/15 0434 04/02/15 0225  CALCIUM 8.0* 7.4* 7.9*  MG  --   --  1.7  PHOS  --   --  4.9*   Sepsis Markers  Recent Labs Lab 03/19/2015 1025 04/01/15 1720 04/01/15 1853  LATICACIDVEN 2.35* 2.3* 2.5*   ABG No results for input(s): PHART, PCO2ART, PO2ART in the last 168 hours. Liver Enzymes No results for input(s): AST, ALT, ALKPHOS, BILITOT, ALBUMIN in the last 168 hours. Cardiac Enzymes  Recent Labs Lab 03/16/2015 0819  TROPONINI 0.05*   Glucose  Recent Labs Lab 04/01/15 0656 04/01/15 0741 04/01/15 1129 04/01/15 1539 04/01/15 1744 04/01/15 2251  GLUCAP 56* 76 104* 105* 91 100*   Imaging No results found.  ASSESSMENT / PLAN:  CARDIOVASCULAR A:  Acute on Chronic Hypotension  Hx HLD, PVD s/p b/l LE amputations and right middle finger amputation, sCHF (last echo from 2014 with EF 35-40%), A.fib on chronic coumadin, CAD.  P:  Neosynephrine PRN for MAP goal SBP > 60. Will not use SBP  as goal since normal BP< 90 Continue outpatient ASA, amiodarone, atorvastatin, midodrine. Hold outpatient warfarin given coagulopathy. Consider restarting 7/21  PULMONARY A: OSA - on nocturnal CPAP. No acute issues  P:  Continue supplemental O2 as needed to maintain SpO2 > 92%. Continue nocturnal CPAP.  RENAL A:  ESRD (Creatinine 3.22), Anuric - M/W/F; did not fully complete dialysis Mon 7/18. Mild Hyponatremia (132) P:  Nephrology following. Will not take to dialysis today--not acidotic, no significant electrolyte abnormalities, normal  mentation Monitor BMP Obtain Cortisol Level Solucortef  GASTROINTESTINAL A:  No acute issues  P:  Renal diet.  HEMATOLOGIC A:  Coumadin Induced Coagulopathy--Improved Thrombocytopenia (137>221)--Resolved Elevated INR (8.24> 1.83)--Improved  P:  S/p 5g Vitamin K and 2u FFP  Monitor CBC Holding home Coumadin (4mg  MWF, 2mg  STTS). Consider restarting 7/21.  INFECTIOUS A:  Septic shock - most likely secondary to Sacral decub ulcer. P:  BCx2 7/18 >> Wound Cx 7/18 >> Multiple organs present, none predominant  Abx: Vanc, start date 7/18, day #3 Abx: Zosyn, start date 7/18, day #3 Wound care team following.  ENDOCRINE A:  DM.  Thyroid disease per history - not on meds.  P:  Moderate SSI Monitor CBG Check TSH.  NEUROLOGIC A:  Acute encephalopathy - likely due to hypotension--Improved Hx Neuropathy, TIA. P:  Continue lyrica.  Family updated: None.  Interdisciplinary Family Meeting v Palliative Care Meeting: Due by: 7/26.  TODAY'S SUMMARY: Continue antibiotics. Wound Care to see decubitus ulcers. Continue Neo for pressure support, goal MAP >60. Obtain Cortisol levels and initiate on Solucortef.  Junie Panning, [facility], PGY-2 Pager: [phone]  04/02/2015, 8:08 AM   Her baseline BP is low >> she reports normal SBP is 90.  Will use MAP goal of 60.  Cortisol level okay so solu cortef d/c'ed.  Need to be careful with volume in setting of ESRD.  Agree no indication for acute HD today.  Will continue zosyn, and vancomycin being renally adjusted.  Continue midodrine.  Continue wound care.  D/w Dr. Joelyn Oms.  CC time by me independent of resident time is 35 minutes.  Chesley Mires, MD [facility] Pulmonary/Critical Care 04/02/2015, 3:56 PM Pager:  [phone] After 3pm call: [phone]

## 2015-04-03 DIAGNOSIS — Z89511 Acquired absence of right leg below knee: Secondary | ICD-10-CM

## 2015-04-03 DIAGNOSIS — D72829 Elevated white blood cell count, unspecified: Secondary | ICD-10-CM

## 2015-04-03 DIAGNOSIS — Z89612 Acquired absence of left leg above knee: Secondary | ICD-10-CM

## 2015-04-03 DIAGNOSIS — I4891 Unspecified atrial fibrillation: Secondary | ICD-10-CM

## 2015-04-03 DIAGNOSIS — T798XXS Other early complications of trauma, sequela: Secondary | ICD-10-CM

## 2015-04-03 DIAGNOSIS — L8990 Pressure ulcer of unspecified site, unspecified stage: Secondary | ICD-10-CM

## 2015-04-03 LAB — CBC
HEMATOCRIT: 44.5 % (ref 36.0–46.0)
Hemoglobin: 14.6 g/dL (ref 12.0–15.0)
MCH: 27.6 pg (ref 26.0–34.0)
MCHC: 32.8 g/dL (ref 30.0–36.0)
MCV: 84.1 fL (ref 78.0–100.0)
Platelets: 245 10*3/uL (ref 150–400)
RBC: 5.29 MIL/uL — ABNORMAL HIGH (ref 3.87–5.11)
RDW: 16.5 % — ABNORMAL HIGH (ref 11.5–15.5)
WBC: 28 10*3/uL — ABNORMAL HIGH (ref 4.0–10.5)

## 2015-04-03 LAB — BASIC METABOLIC PANEL
Anion gap: 15 (ref 5–15)
BUN: 27 mg/dL — ABNORMAL HIGH (ref 6–20)
CALCIUM: 7.7 mg/dL — AB (ref 8.9–10.3)
CO2: 22 mmol/L (ref 22–32)
CREATININE: 3.78 mg/dL — AB (ref 0.44–1.00)
Chloride: 95 mmol/L — ABNORMAL LOW (ref 101–111)
GFR calc non Af Amer: 12 mL/min — ABNORMAL LOW (ref >60)
GFR, EST AFRICAN AMERICAN: 14 mL/min — AB (ref >60)
GLUCOSE: 51 mg/dL — AB (ref 65–99)
Potassium: 4.6 mmol/L (ref 3.5–5.1)
SODIUM: 132 mmol/L — AB (ref 135–145)

## 2015-04-03 LAB — GLUCOSE, CAPILLARY
GLUCOSE-CAPILLARY: 82 mg/dL (ref 65–99)
GLUCOSE-CAPILLARY: 65 mg/dL (ref 65–99)
GLUCOSE-CAPILLARY: 99 mg/dL (ref 65–99)
Glucose-Capillary: 64 mg/dL — ABNORMAL LOW (ref 65–99)

## 2015-04-03 LAB — WOUND CULTURE
GRAM STAIN: NONE SEEN
Special Requests: NORMAL

## 2015-04-03 LAB — PROTIME-INR
INR: 1.36 (ref 0.00–1.49)
Prothrombin Time: 16.9 seconds — ABNORMAL HIGH (ref 11.6–15.2)

## 2015-04-03 MED ORDER — SODIUM CHLORIDE 0.9 % IV BOLUS (SEPSIS)
500.0000 mL | Freq: Once | INTRAVENOUS | Status: AC
  Administered 2015-04-03: 500 mL via INTRAVENOUS

## 2015-04-03 MED ORDER — WARFARIN SODIUM 3 MG PO TABS
3.0000 mg | ORAL_TABLET | Freq: Once | ORAL | Status: AC
  Administered 2015-04-03: 3 mg via ORAL
  Filled 2015-04-03: qty 1

## 2015-04-03 MED ORDER — RENA-VITE PO TABS
1.0000 | ORAL_TABLET | Freq: Every day | ORAL | Status: DC
  Administered 2015-04-03 – 2015-04-09 (×6): 1 via ORAL
  Filled 2015-04-03 (×9): qty 1

## 2015-04-03 NOTE — Consult Note (Signed)
Physical Medicine and Rehabilitation Consult Reason for Consult: Debilitation/end-stage renal disease/history of left AKA and right BKA Referring Physician: Dr. Elsworth Soho   HPI: [name] is a 59 y.o. right handed female with history of diabetes mellitus peripheral neuropathy, ischemic cardiomyopathy/CAD with CABG, PAF on chronic Coumadin, end-stage renal disease with hemodialysis, diastolic congestive heart failure, right BKA and left AKA as well as decubitus ulcers to the buttocks that was currently being treated by home health nurse. Patient lives alone primarily using a power wheelchair. She has not used bilateral prosthesis for quite some time that were modified by advanced prosthetics. Well known to rehabilitation services July 2015 after her left AKA and was discharged home. Presented 03/19/2015 with increasing shortness of breath and hypotension. Found to have atrial fibrillation RVR in the emergency department. She was given a dose of amiodarone. Chest x-ray was negative. Troponin negative. Elevated white blood cell count 18,500 and placed on broad-spectrum antibodies. Wound care nurse consulted for buttocks sacral wounds. Hemodialysis ongoing as per renal services. Chronic Coumadin ongoing as per pharmacy protocol. Physical and occupational therapy evaluations completed with recommendations of physical medicine rehabilitation consult. .  Patient was living by herself. Her brother checked in on her.She rode [facility] 2 hemodialysis 3 times a week. Patient states she had difficulty dressing and bathing herself.  Review of Systems  Constitutional: Negative for fever and chills.  Eyes: Negative for blurred vision and double vision.  Respiratory: Positive for shortness of breath. Negative for cough.   Cardiovascular: Positive for palpitations.  Gastrointestinal: Positive for nausea and constipation.  Musculoskeletal: Positive for myalgias and joint pain.  Skin: Negative for rash.    Neurological: Positive for dizziness and headaches.   Past Medical History  Diagnosis Date  . Diabetes mellitus, type 2   . Secondary hyperparathyroidism   . Ischemic cardiomyopathy     a. EF 25-30% 2011;  b.  Echocardiogram (09/01/2013): EF 30-35%, diffuse hypokinesis, MAC, mild MR, mild LAE, mild RVE, severely reduced RVSF, moderate RAE, mildly increased PASP.  [name] Pulmonary hypertension     Est PAsyst 60-Dr Linard Millers, off coumadin, CT neg for PE  . Hyperlipidemia   . Anemia   . Thyroid disease   . Pulmonary edema   . Peripheral vascular disease     a. Prior LE amputations. b. s/p prior LE bypass surgery.  . Sebaceous cyst   . 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 Richardson Dopp  . Shortness of breath     with exertion  . Hemodialysis patient     M,W,F  . TIA (transient ischemic attack) June 2014  . Ulcer of heel due to diabetes     left  . Atrial fibrillation     a. Dx 08/2013, placed on amiodarone.  . Chronic systolic CHF (congestive heart failure)   . CAD (coronary artery disease)     a. s/p CABG 2011.;  b. Lexiscan Myoview (02/2013): Intermediate risk study; EF 50%, basal inferolateral, basal anterolateral and mid inferolateral moderate ischemia; basal anterior, basal anteroseptal and mid anterior scar;  c. Type 2 NSTEMI during admission for LE revasc surgery 02/2013 in setting of SVT  . Polyneuropathy in diabetes(357.2) 10/31/2013  . Neuropathy   . Headache   . Carpal tunnel syndrome, right   . Kidney disease Dialysis MWF    ESRD secondary to DM, failed transplant 2006. Dialysis M-W-F  . OSA (obstructive sleep apnea)     uses c-papNPSG 11.23.11-AHI 64.9/hr-central and obstructive apnea  .  Wound infection 03/2015    BUTTOCKS   Past Surgical History  Procedure Laterality Date  . Coronary artery bypass graft  2011    3V  . Insertion of dialysis catheter    . Above knee leg amputation Left     infection/gangrene 10/11/07  . Nasal mucosal biopsy    . Ovarian cyst surgery     . 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 Laser retinal surgery  2011  . Kidney transplant      rejection  . Abdominal angiogram Left 01/25/13  . Colonoscopy    . Cardiac catheterization    . Femoral-popliteal bypass graft Left 02/20/2013    Procedure:  FEMORAL-to below knee POPLITEAL ARTERY bypasswith saphenous vein with intraoperative angiogram ;  Surgeon: Angelia Mould, MD;  Location: [place];  Service: Vascular;  Laterality: Left;  fem-below knee pop  . Amputation Left 03/27/2013    Procedure: AMPUTATION ABOVE KNEE;  Surgeon: Angelia Mould, MD;  Location: [place];  Service: Vascular;  Laterality: Left;  . Tee without cardioversion N/A 09/11/2013    Procedure: TRANSESOPHAGEAL ECHOCARDIOGRAM (TEE);  Surgeon: Casandra Doffing, MD;  Location: [facility] ENDOSCOPY;  Service: Cardiovascular;  Laterality: N/A;  . Cardioversion N/A 09/11/2013    Procedure: CARDIOVERSION;  Surgeon: Casandra Doffing, MD;  [facility] ENDOSCOPY;  Service: Cardiovascular;  Laterality: N/A;  . Below knee leg amputation Right   . Revison of arteriovenous fistula Right 11/21/2013    Procedure: REVISON OF ARTERIOVENOUS FISTULA;  Surgeon: Rosetta Posner, MD;  Location: [place];  Service: Vascular;  Laterality: Right;  . Cataract surgery Right 01-07-2014  . Lower extremity angiogram Left 01/25/2013    Procedure: LOWER EXTREMITY ANGIOGRAM;  Surgeon: Conrad Woodside, MD;  Location: [facility] CATH LAB;  Service: Cardiovascular;  Laterality: Left;  . Colonoscopy with propofol N/A 12/12/2014    Procedure: COLONOSCOPY WITH PROPOFOL;  Surgeon: Laurence Spates, MD;  Location: [place];  Service: Endoscopy;  Laterality: N/A;  . Eye surgery    . Eye surgery Left November 28, 2014    Laser   . I&d extremity Right 01/30/2015    Procedure: A CELL PLACEMENT, RIGHT LONG FINGER WITH DEBRIDEMENT OF WOUND ;  Surgeon: Leanora Cover, MD;  Location: [place];  Service: Orthopedics;  Laterality: Right;  . Amputation  Right 03/06/2015    Procedure: RIGHT LONG FINGER AMPUTATION ;  Surgeon: Leanora Cover, MD;  Location: [place];  Service: Orthopedics;  Laterality: Right;   Family History  Problem Relation Age of Onset  . Heart disease    . Edema Father     fluid overload  . Diabetes Father   . Cerebral aneurysm Mother   . Diabetes Mother   . Stroke Brother   . Heart disease Brother   . Diabetes Brother   . Hyperlipidemia Brother   . Hypertension Brother   . Peripheral vascular disease Brother     amputation   Social History:  reports that she has never smoked. She has quit using smokeless tobacco. Her smokeless tobacco use included Snuff and Chew. She reports that she does not drink alcohol or use illicit drugs. Allergies:  Allergies  Allergen Reactions  . Neurontin [Gabapentin] Anaphylaxis  . Ezetimibe-Simvastatin Itching and Swelling     facial edema  . Klonopin [Clonazepam] Other (See Comments)    hallucination  . Sertraline Hcl Other (See Comments)    hallucination   Medications Prior to Admission  Medication  Sig Dispense Refill  . acetaminophen (TYLENOL) 325 MG tablet Take 650 mg by mouth every 6 (six) hours as needed.    [name] amiodarone (PACERONE) 200 MG tablet Take 1 tablet (200 mg total) by mouth daily. (Patient taking differently: Take 100 mg by mouth daily. ) 30 tablet 11  . aspirin EC 81 MG tablet Take 81 mg by mouth daily.    [name] atorvastatin (LIPITOR) 10 MG tablet Take 10 mg by mouth daily at 2 PM daily at 2 PM.     . B Complex-C (SUPER B COMPLEX PO) Take 1 tablet by mouth daily at 2 PM daily at 2 PM.    . B Complex-C-Folic Acid (DIALYVITE TABLET) TABS Take 1 tablet by mouth daily at 2 PM daily at 2 PM.   3  . cinacalcet (SENSIPAR) 60 MG tablet Take 60 mg by mouth daily at 2 PM daily at 2 PM.     . diphenoxylate-atropine (LOMOTIL) 2.5-0.025 MG per tablet Take by mouth 4 (four) times daily as needed for diarrhea or loose stools.    . hydrOXYzine (ATARAX/VISTARIL) 25 MG  tablet Take 25 mg by mouth every 6 (six) hours as needed for itching.     . insulin aspart (NOVOLOG FLEXPEN) 100 UNIT/ML FlexPen Inject 2-15 Units into the skin 3 (three) times daily with meals. Per sliding scale    . midodrine (PROAMATINE) 10 MG tablet Take 10 mg by mouth daily at 2 PM daily at 2 PM.     . pregabalin (LYRICA) 25 MG capsule Take 25 mg by mouth daily at 2 PM daily at 2 PM.     . sevelamer carbonate (RENVELA) 800 MG tablet Take 1,600 mg by mouth daily with lunch.     . warfarin (COUMADIN) 4 MG tablet Take 2-4 mg by mouth See admin instructions. Take 4mg  Mon-Wed-Frid  And  Take 2mg  Sun-Tue-Thur- Sat.    . doxycycline (VIBRA-TABS) 100 MG tablet Take 100 mg by mouth 2 (two) times daily.  0    Home: Home Living Family/patient expects to be discharged to:: Inpatient rehab Living Arrangements: Alone Available Help at Discharge: Family, Available PRN/intermittently Type of Home: Apartment Home Access: Level entry Home Layout: One level Home Equipment: Wheelchair - manual, Tub bench, Other (comment), Bedside commode (sliding board) Additional Comments: sounds like pt has walk-in and tub/shower-unsure of accuracy  Functional History: Prior Function Level of Independence: Independent with assistive device(s) Comments: Pt states she was independent PTA, doing her own ADL's (unsure how well she was completing tasks - information was confusing). Pt states she was transferring to a w/c with a slide board. Pt was confusing when talking about PLOF, so unsure of accuracy. Functional Status:  Mobility: Bed Mobility Overal bed mobility: Needs Assistance, +2 for physical assistance Bed Mobility: Rolling, Supine to Sit, Sit to Sidelying Rolling: Mod assist Supine to sit: Total assist, +2 for physical assistance Sit to sidelying: Mod assist General bed mobility comments: Pt had difficulty initiating roll to the L due to inability to reach with the RUE. Total assist was required to achieve hip  flexion and trunk elevation to full sitting position. Pt required positioning of a therapist in front of her as she is at increased risk of sliding forward during transitions. Pt did well controlling trunk to rest on L elbow to transition to sidelying position however required assist for proper LUE positioning.        ADL: ADL Overall ADL's : Needs assistance/impaired Lower Body Bathing: Maximal assistance,  Bed level Upper Body Dressing : Bed level, Moderate assistance Lower Body Dressing: Maximal assistance, Bed level Toileting- Clothing Manipulation and Hygiene: Maximal assistance, Bed level  Cognition: Cognition Overall Cognitive Status: No family/caregiver present to determine baseline cognitive functioning (pt providing confusing information at times) Orientation Level: Oriented X4 Cognition Arousal/Alertness: Awake/alert Behavior During Therapy: WFL for tasks assessed/performed Overall Cognitive Status: No family/caregiver present to determine baseline cognitive functioning (pt providing confusing information at times)  Blood pressure 80/57, pulse 51, temperature 97.4 F (36.3 C), temperature source Oral, resp. rate 21, height 4\' 8"  (1.422 m), weight 83.1 kg (183 lb 3.2 oz), SpO2 94 %. Physical Exam  HENT:  Head: Normocephalic.  Eyes: EOM are normal.  Neck: Normal range of motion. Neck supple. No thyromegaly present.  Cardiovascular:  Cardiac rate controlled  Respiratory: Effort normal and breath sounds normal.  GI: Soft. Bowel sounds are normal. She exhibits no distension.  Neurological:  Patient is a bit lethargic but arousable. She makes good eye contact with examiner. Fair awareness of her deficits. Oriented to person place and date of birth. Follows simple commands  Skin:  No evidence of sacral decubitus  Evidence of grade 2 bilateral ischial decubiti  Well-healed right BKA and left AKA 1+ edema in right BKA as well as left AKA Motor strength is 4/5 bilateral  deltoid, biceps, triceps, grip 3/5 bilateral hip flexors  Results for orders placed or performed during the hospital encounter of 03/26/2015 (from the past 24 hour(s))  Glucose, capillary     Status: None   Collection Time: 04/02/15  1:09 PM  Result Value Ref Range   Glucose-Capillary 95 65 - 99 mg/dL  Glucose, capillary     Status: Abnormal   Collection Time: 04/02/15  5:46 PM  Result Value Ref Range   Glucose-Capillary 111 (H) 65 - 99 mg/dL  Glucose, capillary     Status: Abnormal   Collection Time: 04/02/15 10:16 PM  Result Value Ref Range   Glucose-Capillary 111 (H) 65 - 99 mg/dL  Protime-INR     Status: Abnormal   Collection Time: 04/03/15  3:14 AM  Result Value Ref Range   Prothrombin Time 16.9 (H) 11.6 - 15.2 seconds   INR 1.36 0.00 - 1.75  Basic metabolic panel     Status: Abnormal   Collection Time: 04/03/15  3:14 AM  Result Value Ref Range   Sodium 132 (L) 135 - 145 mmol/L   Potassium 4.6 3.5 - 5.1 mmol/L   Chloride 95 (L) 101 - 111 mmol/L   CO2 22 22 - 32 mmol/L   Glucose, Bld 51 (L) 65 - 99 mg/dL   BUN 27 (H) 6 - 20 mg/dL   Creatinine, Ser 3.78 (H) 0.44 - 1.00 mg/dL   Calcium 7.7 (L) 8.9 - 10.3 mg/dL   GFR calc non Af Amer 12 (L) >60 mL/min   GFR calc Af Amer 14 (L) >60 mL/min   Anion gap 15 5 - 15  CBC     Status: Abnormal   Collection Time: 04/03/15  6:46 AM  Result Value Ref Range   WBC 28.0 (H) 4.0 - 10.5 K/uL   RBC 5.29 (H) 3.87 - 5.11 MIL/uL   Hemoglobin 14.6 12.0 - 15.0 g/dL   HCT 44.5 36.0 - 46.0 %   MCV 84.1 78.0 - 100.0 fL   MCH 27.6 26.0 - 34.0 pg   MCHC 32.8 30.0 - 36.0 g/dL   RDW 16.5 (H) 11.5 - 15.5 %   Platelets 245  150 - 400 K/uL  Glucose, capillary     Status: None   Collection Time: 04/03/15  7:28 AM  Result Value Ref Range   Glucose-Capillary 82 65 - 99 mg/dL   No results found.  Assessment/Plan: Diagnosis: Deconditioning secondary to multiple medical issues in a patient with prior left AKA and right BKA 1. Does the need for close,  24 hr/day medical supervision in concert with the patient's rehab needs make it unreasonable for this patient to be served in a less intensive setting? No 2. Co-Morbidities requiring supervision/potential complications: End-stage renal disease, bilateral ischial decubiti 3. Due to bowel management, skin/wound care, medication administration, pain management and patient education, does the patient require 24 hr/day rehab nursing? Potentially 4. Does the patient require coordinated care of a physician, rehab nurse, PT (0.5-1 hrs/day, 5 days/week) and OT (0.5-1 hrs/day, 5 days/week) to address physical and functional deficits in the context of the above medical diagnosis(es)? No Addressing deficits in the following areas: locomotion, strength, transferring, bathing, dressing and grooming 5. Can the patient actively participate in an intensive therapy program of at least 3 hrs of therapy per day at least 5 days per week? No 6. The potential for patient to make measurable gains while on inpatient rehab is not applicable 7. Anticipated functional outcomes upon discharge from inpatient rehab are n/a  with PT, n/a with OT, n/a with SLP. 8. Estimated rehab length of stay to reach the above functional goals is: Not applicable 9. Does the patient have adequate social supports and living environment to accommodate these discharge functional goals? N/A 10. Anticipated D/C setting: possible home after skilled nursing 11. Anticipated post D/C treatments: [place] therapy 12. Overall Rehab/Functional Prognosis: poor  RECOMMENDATIONS: This patient's condition is appropriate for continued rehabilitative care in the following setting: SNF Patient has agreed to participate in recommended program. Yes Note that insurance prior authorization may be required for reimbursement for recommended care.  Comment: Patient had difficulty functioning independently at home. Needs additional time in a supervised setting to help ischial  skin breakdown to heal. Minimize sitting in chair. Recommend skilled nursing to heal wounds then hopefully home but would need caregiver support. Do not think inpatient rehabilitation is needed to reduce burden of care.  Available length of stay for inpatient rehabilitation not sufficient for wound healing in this patient    04/03/2015

## 2015-04-03 NOTE — Care Management Note (Signed)
Case Management Note  Patient Details  Name: [name] MRN: [id number] Date of Birth: April 13, 1956  Subjective/Objective:     Lives at home alone.  Has family close by that assists her.  Brother in room verifying this.  Uses [employer] bus to get to and from dialysis.  Independent in the house.  Able to get her meds and has a PCP, August Saucer.  PT/OT recommending [facility] - has been there before.             Action/Plan:   Expected Discharge Date:                  Expected Discharge Plan:  Skilled Nursing Facility  In-House Referral:  Clinical Social Work  Discharge planning Services  CM Consult  Post Acute Care Choice:    Choice offered to:  Patient  DME Arranged:    DME Agency:     HH Arranged:    [place] Agency:  [place]  Status of Service:  In process, will continue to follow  Medicare Important Message Given:    Date Medicare IM Given:    Medicare IM give by:    Date Additional Medicare IM Given:    Additional Medicare Important Message give by:     If discussed at [place] of Stay Meetings, dates discussed:    Additional Comments:  Vergie Living, RN 04/03/2015, 10:43 AM

## 2015-04-03 NOTE — Progress Notes (Signed)
Admit: 03/25/2015 LOS: 3  37F ESRD on iHD with SIRS  Subjective:  Still req low dose Phenyl gtt AAO, on RA Labs reviewed, K WNL On midodrine Micro data NGTD   07/20 0701 - 07/21 0700 In: 1560 [P.O.:180; I.V.:1230; IV Piggyback:150] Out: -   Filed Weights   03/25/2015 1256 03/17/2015 1520 04/08/2015 1957  Weight: 83.915 kg (185 lb) 84.2 kg (185 lb 10 oz) 83.1 kg (183 lb 3.2 oz)    Scheduled Meds: . sodium chloride   Intravenous Once  . amiodarone  100 mg Oral Daily  . antiseptic oral rinse  7 mL Mouth Rinse BID  . aspirin EC  81 mg Oral Daily  . atorvastatin  10 mg Oral q1800  . cinacalcet  60 mg Oral Q supper  . collagenase   Topical Daily  . ferric gluconate (FERRLECIT/NULECIT) IV  62.5 mg Intravenous Weekly  . insulin aspart  0-15 Units Subcutaneous TID WC  . midodrine  10 mg Oral TID WC  . multivitamin  1 tablet Oral Q1400  . piperacillin-tazobactam (ZOSYN)  IV  2.25 g Intravenous 3 times per day  . pregabalin  25 mg Oral Q1400  . sevelamer carbonate  1,600 mg Oral Q lunch  . sodium chloride  3 mL Intravenous Q12H  . Warfarin - Pharmacist Dosing Inpatient   Does not apply q1800   Continuous Infusions: . phenylephrine (NEO-SYNEPHRINE) Adult infusion 20 mcg/min (04/03/15 0800)   PRN Meds:.acetaminophen **OR** acetaminophen, alum & mag hydroxide-simeth, diphenoxylate-atropine, hydrOXYzine, ondansetron **OR** ondansetron (ZOFRAN) IV, senna-docusate  Current Labs: reviewed  INR up to ~8   Physical Exam:  Blood pressure 83/49, pulse 73, temperature 97.4 F (36.3 C), temperature source Oral, resp. rate 17, height 4\' 8"  (1.422 m), weight 83.1 kg (183 lb 3.2 oz), SpO2 92 %. GEN: NAD CV: RRR no rub AVF: +B/T LUNGS: CTAB AAO X3  Dialysis Orders: Center: [facility] on MWF . EDW 81 kg HD Bath 3.0 K 2.5 Ca Time 4 hours Heparin 8000 units. Access RFA AVF BFR 400 DFR 800  Calcitriol .25 mcg PO q MWF Venofer 50 mg IV q week with HD  A/P 1. ESRD 1. MWF as outpt, hold again  today 7/21 2/2 low BP no strong indication for HD 2. Hold bolus heparin, supratherapeutic INR 3. Using AVF; if HD needed might be CRRT and would req HD Catheter 2. ? Decubitus Wound Infection with leukocytosis and low grade fevers 1. On Vanc/Zosyn, WBC worsened 2. Wound GS no bactereia seen 3. BCx and Wound Cx pending 4. Per CCM 3. Vol/BP and hypotension on phenyl gtt / SIRS 1. Using bed weights 2. Chronic low BP 3. No HD today, on midodrine, might need albumin with HD 4. Anemia: stable not on ESA 5. MBD: cotn outpt meds 6. AFib 1. On warfarin, presented with supratherapeutic INR, holding coumadin per TRH 2. No heparin with HD  [name] MD 04/03/2015, 8:31 AM   Recent Labs Lab 04/02/15 0225 04/02/15 0927 04/03/15 0314  NA 132* 132* 132*  K 3.6 4.2 4.6  CL 93* 96* 95*  CO2 27 24 22   GLUCOSE 79 65 51*  BUN 20 23* 27*  CREATININE 3.22* 3.44* 3.78*  CALCIUM 7.9* 7.6* 7.7*  PHOS 4.9* 4.8*  --     Recent Labs Lab 03/30/2015 0819  04/02/15 0225 04/02/15 0927 04/03/15 0646  WBC 18.5*  < > 25.9* 27.9* 28.0*  NEUTROABS 15.9*  --   --   --   --  HGB 14.3  < > 14.2 14.4 14.6  HCT 43.2  < > 43.8 43.7 44.5  MCV 84.2  < > 85.5 85.0 84.1  PLT 137*  < > 221 214 245  < > = values in this interval not displayed.

## 2015-04-03 NOTE — Progress Notes (Signed)
PULMONARY / CRITICAL CARE MEDICINE   Name: [name] MRN: [id number] DOB: Aug 17, 1956    ADMISSION DATE:  03/30/2015 CONSULTATION DATE:  04/01/15  REFERRING MD :  Eliseo Squires  CHIEF COMPLAINT:  Hypotension, Sepsis secondary to Decubitus Ulcers  INITIAL PRESENTATION:  59 y.o. F with ESRD (MWF) admitted to [facility] 7/18 with leukocytosis and elevated lactate. On 7/19, had persistent hypotension despite fluid boluses; therefore, PCCM called for ICU transfer.  STUDIES:  7/18 CXR >> No Acute Cardiopulmonary Disease  SIGNIFICANT EVENTS: 7/18 - admit. 7/19 - transfer to ICU for hypotension not responding to IVF boluses. Given 5mg  vit K and 2u FFP for INR 8.24.  SUBJECTIVE:   No acute events overnight.  VITAL SIGNS: Temp:  [97.4 F (36.3 C)-98.1 F (36.7 C)] 97.4 F (36.3 C) (07/21 0816) Pulse Rate:  [32-97] 51 (07/21 1000) Resp:  [12-28] 21 (07/21 1000) BP: (73-112)/(43-78) 80/57 mmHg (07/21 0945) SpO2:  [88 %-100 %] 94 % (07/21 1000) HEMODYNAMICS:   VENTILATOR SETTINGS:   INTAKE / OUTPUT:  Intake/Output Summary (Last 24 hours) at 04/03/15 1101 Last data filed at 04/03/15 1000  Gross per 24 hour  Intake   1560 ml  Output      0 ml  Net   1560 ml   PHYSICAL EXAMINATION: General:  59yo female resting comfortably in no apparent distress Neuro:  No focal deficits, alert HEENT:  MMM, PERRLA Cardiovascular:  S1 and S2 noted, no murmurs, regular rate and rhythm Lungs:  Clear to auscultation bilaterally, equal breath sounds, no wheezes Abdomen:  Soft and nondistended, bowel sounds noted, mild diffuse tenderness to palpation Musculoskeletal:  Left AKA, right BKA, right middle finger amputated Skin:  Unstageable decubitus sacral ulcers bilateral x2 (one on right and one on left)  LABS:  CBC  Recent Labs Lab 04/02/15 0225 04/02/15 0927 04/03/15 0646  WBC 25.9* 27.9* 28.0*  HGB 14.2 14.4 14.6  HCT 43.8 43.7 44.5  PLT 221 214 245   Coag's  Recent Labs Lab 04/01/15 0434  04/02/15 0225 04/03/15 0314  INR 8.24* 1.83* 1.36   BMET  Recent Labs Lab 04/02/15 0225 04/02/15 0927 04/03/15 0314  NA 132* 132* 132*  K 3.6 4.2 4.6  CL 93* 96* 95*  CO2 27 24 22   BUN 20 23* 27*  CREATININE 3.22* 3.44* 3.78*  GLUCOSE 79 65 51*   Electrolytes  Recent Labs Lab 04/02/15 0225 04/02/15 0927 04/03/15 0314  CALCIUM 7.9* 7.6* 7.7*  MG 1.7  --   --   PHOS 4.9* 4.8*  --    Sepsis Markers  Recent Labs Lab 03/30/2015 1025 04/01/15 1720 04/01/15 1853  LATICACIDVEN 2.35* 2.3* 2.5*   ABG No results for input(s): PHART, PCO2ART, PO2ART in the last 168 hours. Liver Enzymes  Recent Labs Lab 04/02/15 0927  ALBUMIN 1.3*   Cardiac Enzymes  Recent Labs Lab 04/13/2015 0819  TROPONINI 0.05*   Glucose  Recent Labs Lab 04/02/15 0846 04/02/15 0942 04/02/15 1309 04/02/15 1746 04/02/15 2216 04/03/15 0728  GLUCAP 60* 74 95 111* 111* 82   Imaging No results found.  ASSESSMENT / PLAN:  CARDIOVASCULAR A:  Acute on Chronic Hypotension  Hx HLD, PVD s/p b/l LE amputations and right middle finger amputation, sCHF (last echo from 2014 with EF 35-40%), A.fib on chronic coumadin, CAD.  P:  Neo PRN for MAP goal > 55. Will not use SBP as goal since normal BP< 90.  Continue outpatient ASA, amiodarone, atorvastatin, midodrine. Restart Warfarin today per pharmacy  PULMONARY A: OSA - on nocturnal CPAP. No acute issues  P:  Continue supplemental O2 as needed to maintain SpO2 > 92%. Continue nocturnal CPAP.  RENAL A:  ESRD (Creatinine 3.78), Anuric - M/W/F; did not fully complete dialysis Mon 7/18. Mild Hyponatremia (132)  P:  Nephrology following. Dialysis MWF. Last dialysis Monday, 7/18, but did not complete Monitor BMP  GASTROINTESTINAL A:  No acute issues  P:  Renal diet.  HEMATOLOGIC A:  Coumadin Induced Coagulopathy--Improved Thrombocytopenia (137>214)--Resolved Elevated INR (8.24> 1.36)--Improved  P:  S/p 5g  Vitamin K and 2u FFP  Monitor CBC Restart Warfarin today per pharmacy  INFECTIOUS A:  Septic shock - most likely secondary to Sacral decub ulcer.  P:  BCx2 7/18 >> Wound Cx 7/18 >> Multiple organs present, none predominant  Abx: Vanc, start date 7/18, day #4 Abx: Zosyn, start date 7/18, day #4 Wound care team following.  ENDOCRINE A:  DM.  Thyroid disease per history - not on meds.  P:  Moderate SSI Monitor CBG Check TSH.  NEUROLOGIC A:  Acute encephalopathy - likely due to hypotension--Improved Hx Neuropathy, TIA. P:  Continue lyrica.  Disposition: PT/OT recommends CIR vs. SNF. Rehab admissions recommends inpatient rehab consult when medically appropriate.   Family updated: None.  Interdisciplinary Family Meeting v Palliative Care Meeting: Due by: 7/26.   TODAY'S SUMMARY: Continue antibiotics. Continue Neo for pressure support, goal MAP >55. Wean Neo as able.   Junie Panning, DO Family Medicine, PGY-2 Pager: [phone]  04/03/2015, 11:01 AM   Much more alert.  Feels better overall.  Tolerating diet.  Rt hand feels better.  Rt hand w/o pain on palpation.  Heart rate irregular, no wheeze, abdomen soft.  Will continue Abx.  Will change MAP goal to > 55, and wean off pressors as tolerated.  HD per nephrology.  Updated pts family at bedside.  Chesley Mires, MD [facility] Pulmonary/Critical Care 04/03/2015, 3:17 PM Pager:  [phone] After 3pm call: [phone]

## 2015-04-03 NOTE — Progress Notes (Signed)
Hypoglycemic Event  CBG: 65  Treatment: 15 GM carbohydrate snack  Symptoms: None  Follow-up CBG: Time:2030 CBG Result:64  Possible Reasons for Event: Inadequate meal intake  Comments/MD notified:Given a second 4 oz juice, follow up 2050 result 99.    Jaleil Renwick S  Remember to initiate Hypoglycemia Order Set & complete

## 2015-04-03 NOTE — Progress Notes (Addendum)
ANTICOAGULATION and ANTIBIOTIC CONSULT NOTE - Follow Up Consult  Pharmacy Consult for warfarin, vancomycin, Zosyn Indication: atrial fibrillation, decubitus ulcer  Allergies  Allergen Reactions  . Neurontin [Gabapentin] Anaphylaxis  . Ezetimibe-Simvastatin Itching and Swelling     facial edema  . Klonopin [Clonazepam] Other (See Comments)    hallucination  . Sertraline Hcl Other (See Comments)    hallucination    Patient Measurements: Height: 4\' 8"  (142.2 cm) Weight: 183 lb 3.2 oz (83.1 kg) IBW/kg (Calculated) : 36.3   Vital Signs: Temp: 97.4 F (36.3 C) (07/21 0816) Temp Source: Oral (07/21 0816) BP: 85/62 mmHg (07/21 1100) Pulse Rate: 94 (07/21 1100)  Labs:  Recent Labs  04/01/15 0434 04/02/15 0225 04/02/15 0927 04/03/15 0314 04/03/15 0646  HGB 13.1 14.2 14.4  --  14.6  HCT 40.3 43.8 43.7  --  44.5  PLT 138* 221 214  --  245  LABPROT 65.4* 21.1*  --  16.9*  --   INR 8.24* 1.83*  --  1.36  --   CREATININE 2.86* 3.22* 3.44* 3.78*  --     Estimated Creatinine Clearance: 13.7 mL/min (by C-G formula based on Cr of 3.78).   Assessment: 59 year old female admitted from hemodialysis with tachycardia. She is on chronic anticoagulation with Coumadin for atrial fibrillation. Her home regimen is Warfarin 4mg  MWF and Warfarin 2mg  TTSS. Her INR was supratherapeutic at 8.24 on 7/19 and she was given 5mg  Vit K + 2 units FFP. Current INR is 1.36.  Hgb/Hct wnl, Plt wnl. No bleeding reported.  Patient is on Day #4 of abx for sacral wound infxn. WBC elevated 28, LA 2.5 (7/19), afebrile.  Vanc was ordered as 1gm Mon/Wed/Fri HD; pt received 1gm x1 in ED. Additional 750 mg was ordered to complete load, but not given. Pt then went to HD and received 1250 mg post-HD. Estimated pre-HD Vanc trough= 13.4, post-HD before re-dose = 7.5, post-HD after re-dose = 24.2. No standing vanc orders, awaiting dialysis schedule. Currently on Zosyn 2.25 mg TID  7/18 Sacral wound > Multiple  organisms present, none predominant. No staph aureus, no GAS isolated 7/18 BCx > NG x2d 7/18 MRSA PCR - NEG  Goal of Therapy:  INR 2-3 Monitor platelets by anticoagulation protocol: Yes   Plan: Warfarin 3 mg po x1 No standing vanc orders, will re-enter as appropriate with dialysis Continue Zosyn 2.25 g q8h Daily INR Monitor for s/sx of bleeding. F/u cultures, length of therapy  Governor Specking, PharmD. Clinical Pharmacist Resident Pager: [phone]

## 2015-04-04 ENCOUNTER — Inpatient Hospital Stay (HOSPITAL_COMMUNITY): Payer: Medicare Other

## 2015-04-04 DIAGNOSIS — G4089 Other seizures: Secondary | ICD-10-CM

## 2015-04-04 DIAGNOSIS — I469 Cardiac arrest, cause unspecified: Secondary | ICD-10-CM | POA: Insufficient documentation

## 2015-04-04 DIAGNOSIS — I472 Ventricular tachycardia: Secondary | ICD-10-CM

## 2015-04-04 DIAGNOSIS — J9601 Acute respiratory failure with hypoxia: Secondary | ICD-10-CM | POA: Insufficient documentation

## 2015-04-04 LAB — POCT I-STAT 3, ART BLOOD GAS (G3+)
ACID-BASE DEFICIT: 14 mmol/L — AB (ref 0.0–2.0)
ACID-BASE DEFICIT: 6 mmol/L — AB (ref 0.0–2.0)
Bicarbonate: 12.8 mEq/L — ABNORMAL LOW (ref 20.0–24.0)
Bicarbonate: 17.5 mEq/L — ABNORMAL LOW (ref 20.0–24.0)
O2 SAT: 99 %
O2 Saturation: 94 %
PH ART: 7.179 — AB (ref 7.350–7.450)
PH ART: 7.367 (ref 7.350–7.450)
PO2 ART: 87 mmHg (ref 80.0–100.0)
PO2 ART: 157 mmHg — AB (ref 80.0–100.0)
Patient temperature: 99
TCO2: 14 mmol/L (ref 0–100)
TCO2: 18 mmol/L (ref 0–100)
pCO2 arterial: 34.4 mmHg — ABNORMAL LOW (ref 35.0–45.0)
pCO2 arterial: 30.6 mmHg — ABNORMAL LOW (ref 35.0–45.0)

## 2015-04-04 LAB — BASIC METABOLIC PANEL
Anion gap: 14 (ref 5–15)
BUN: 30 mg/dL — ABNORMAL HIGH (ref 6–20)
CO2: 22 mmol/L (ref 22–32)
CREATININE: 4.38 mg/dL — AB (ref 0.44–1.00)
Calcium: 7.5 mg/dL — ABNORMAL LOW (ref 8.9–10.3)
Chloride: 91 mmol/L — ABNORMAL LOW (ref 101–111)
GFR calc Af Amer: 12 mL/min — ABNORMAL LOW (ref >60)
GFR calc non Af Amer: 10 mL/min — ABNORMAL LOW (ref >60)
Glucose, Bld: 100 mg/dL — ABNORMAL HIGH (ref 65–99)
POTASSIUM: 3.9 mmol/L (ref 3.5–5.1)
SODIUM: 127 mmol/L — AB (ref 135–145)

## 2015-04-04 LAB — PROTIME-INR
INR: 1.65 — ABNORMAL HIGH (ref 0.00–1.49)
INR: 2.3 — ABNORMAL HIGH (ref 0.00–1.49)
PROTHROMBIN TIME: 19.5 s — AB (ref 11.6–15.2)
Prothrombin Time: 25 seconds — ABNORMAL HIGH (ref 11.6–15.2)

## 2015-04-04 LAB — CG4 I-STAT (LACTIC ACID): Lactic Acid, Venous: 8.85 mmol/L (ref 0.5–2.0)

## 2015-04-04 LAB — GLUCOSE, CAPILLARY
GLUCOSE-CAPILLARY: 119 mg/dL — AB (ref 65–99)
GLUCOSE-CAPILLARY: 75 mg/dL (ref 65–99)
GLUCOSE-CAPILLARY: 287 mg/dL — AB (ref 65–99)
GLUCOSE-CAPILLARY: 68 mg/dL (ref 65–99)
Glucose-Capillary: 74 mg/dL (ref 65–99)
Glucose-Capillary: 75 mg/dL (ref 65–99)
Glucose-Capillary: 216 mg/dL — ABNORMAL HIGH (ref 65–99)
Glucose-Capillary: 150 mg/dL — ABNORMAL HIGH (ref 65–99)

## 2015-04-04 LAB — COMPREHENSIVE METABOLIC PANEL
ALK PHOS: 75 U/L (ref 38–126)
ALT: 49 U/L (ref 14–54)
AST: 116 U/L — ABNORMAL HIGH (ref 15–41)
Albumin: 1.1 g/dL — ABNORMAL LOW (ref 3.5–5.0)
Anion gap: 18 — ABNORMAL HIGH (ref 5–15)
BUN: 31 mg/dL — ABNORMAL HIGH (ref 6–20)
CO2: 16 mmol/L — ABNORMAL LOW (ref 22–32)
Calcium: 7 mg/dL — ABNORMAL LOW (ref 8.9–10.3)
Chloride: 95 mmol/L — ABNORMAL LOW (ref 101–111)
Creatinine, Ser: 4.46 mg/dL — ABNORMAL HIGH (ref 0.44–1.00)
GFR calc Af Amer: 11 mL/min — ABNORMAL LOW (ref >60)
GFR, EST NON AFRICAN AMERICAN: 10 mL/min — AB (ref >60)
Glucose, Bld: 187 mg/dL — ABNORMAL HIGH (ref 65–99)
POTASSIUM: 3.5 mmol/L (ref 3.5–5.1)
SODIUM: 129 mmol/L — AB (ref 135–145)
Total Bilirubin: 1.1 mg/dL (ref 0.3–1.2)
Total Protein: 4.9 g/dL — ABNORMAL LOW (ref 6.5–8.1)

## 2015-04-04 LAB — CBC
HCT: 46.2 % — ABNORMAL HIGH (ref 36.0–46.0)
HEMATOCRIT: 43.6 % (ref 36.0–46.0)
HEMOGLOBIN: 15.2 g/dL — AB (ref 12.0–15.0)
HEMOGLOBIN: 14.2 g/dL (ref 12.0–15.0)
MCH: 27.9 pg (ref 26.0–34.0)
MCH: 28.1 pg (ref 26.0–34.0)
MCHC: 32.9 g/dL (ref 30.0–36.0)
MCHC: 32.6 g/dL (ref 30.0–36.0)
MCV: 84.8 fL (ref 78.0–100.0)
MCV: 86.2 fL (ref 78.0–100.0)
PLATELETS: 278 10*3/uL (ref 150–400)
Platelets: 289 10*3/uL (ref 150–400)
RBC: 5.45 MIL/uL — ABNORMAL HIGH (ref 3.87–5.11)
RBC: 5.06 MIL/uL (ref 3.87–5.11)
RDW: 16.7 % — AB (ref 11.5–15.5)
RDW: 16.7 % — ABNORMAL HIGH (ref 11.5–15.5)
WBC: 23.4 10*3/uL — AB (ref 4.0–10.5)
WBC: 32.5 10*3/uL — ABNORMAL HIGH (ref 4.0–10.5)

## 2015-04-04 LAB — MAGNESIUM: MAGNESIUM: 2.5 mg/dL — AB (ref 1.7–2.4)

## 2015-04-04 LAB — VANCOMYCIN, RANDOM: VANCOMYCIN RM: 10 ug/mL

## 2015-04-04 LAB — TROPONIN I
TROPONIN I: 1.04 ng/mL — AB (ref <0.031)
Troponin I: 0.86 ng/mL (ref <0.031)
Troponin I: 1.4 ng/mL (ref <0.031)

## 2015-04-04 LAB — LACTIC ACID, PLASMA
Lactic Acid, Venous: 8.9 mmol/L (ref 0.5–2.0)
Lactic Acid, Venous: 4.3 mmol/L (ref 0.5–2.0)

## 2015-04-04 MED ORDER — HEPARIN SODIUM (PORCINE) 1000 UNIT/ML DIALYSIS
1000.0000 [IU] | INTRAMUSCULAR | Status: DC | PRN
  Administered 2015-04-04 (×2): 1400 [IU] via INTRAVENOUS_CENTRAL
  Filled 2015-04-04: qty 6
  Filled 2015-04-04: qty 3
  Filled 2015-04-04 (×2): qty 6

## 2015-04-04 MED ORDER — SODIUM CHLORIDE 0.9 % FOR CRRT
INTRAVENOUS_CENTRAL | Status: DC | PRN
  Filled 2015-04-04: qty 1000

## 2015-04-04 MED ORDER — AMIODARONE HCL 100 MG PO TABS
100.0000 mg | ORAL_TABLET | Freq: Every day | ORAL | Status: DC
  Administered 2015-04-05 – 2015-04-10 (×6): 100 mg
  Filled 2015-04-04 (×7): qty 1

## 2015-04-04 MED ORDER — PRISMASOL BGK 4/2.5 32-4-2.5 MEQ/L IV SOLN
INTRAVENOUS | Status: DC
  Administered 2015-04-04 – 2015-04-11 (×27): via INTRAVENOUS_CENTRAL
  Filled 2015-04-04 (×38): qty 5000

## 2015-04-04 MED ORDER — CETYLPYRIDINIUM CHLORIDE 0.05 % MT LIQD
7.0000 mL | Freq: Four times a day (QID) | OROMUCOSAL | Status: DC
Start: 2015-04-05 — End: 2015-04-11
  Administered 2015-04-04 – 2015-04-11 (×27): 7 mL via OROMUCOSAL

## 2015-04-04 MED ORDER — DEXTROSE 50 % IV SOLN
25.0000 mL | Freq: Once | INTRAVENOUS | Status: AC
  Administered 2015-04-04: 25 mL via INTRAVENOUS

## 2015-04-04 MED ORDER — SODIUM CHLORIDE 0.9 % IV SOLN
500.0000 mg | INTRAVENOUS | Status: AC
  Administered 2015-04-04: 500 mg via INTRAVENOUS
  Filled 2015-04-04: qty 5

## 2015-04-04 MED ORDER — PRISMASOL BGK 4/2.5 32-4-2.5 MEQ/L IV SOLN
INTRAVENOUS | Status: DC
  Administered 2015-04-04 – 2015-04-11 (×11): via INTRAVENOUS_CENTRAL
  Filled 2015-04-04 (×13): qty 5000

## 2015-04-04 MED ORDER — SODIUM CHLORIDE 0.9 % IV BOLUS (SEPSIS)
500.0000 mL | Freq: Once | INTRAVENOUS | Status: AC
  Administered 2015-04-04: 500 mL via INTRAVENOUS

## 2015-04-04 MED ORDER — VASOPRESSIN 20 UNIT/ML IV SOLN
0.0300 [IU]/min | INTRAVENOUS | Status: DC
  Administered 2015-04-04: 0.03 [IU]/min via INTRAVENOUS
  Filled 2015-04-04 (×2): qty 2

## 2015-04-04 MED ORDER — PHENYLEPHRINE HCL 10 MG/ML IJ SOLN
0.0000 ug/min | INTRAMUSCULAR | Status: DC
  Administered 2015-04-04: 400 ug/min via INTRAVENOUS
  Administered 2015-04-06: 150 ug/min via INTRAVENOUS
  Administered 2015-04-06: 80 ug/min via INTRAVENOUS
  Administered 2015-04-06 (×2): 140 ug/min via INTRAVENOUS
  Administered 2015-04-07: 120 ug/min via INTRAVENOUS
  Administered 2015-04-07: 100 ug/min via INTRAVENOUS
  Administered 2015-04-07: 140 ug/min via INTRAVENOUS
  Administered 2015-04-08 (×3): 160 ug/min via INTRAVENOUS
  Administered 2015-04-08: 120 ug/min via INTRAVENOUS
  Administered 2015-04-09: 80 ug/min via INTRAVENOUS
  Administered 2015-04-09: 160 ug/min via INTRAVENOUS
  Administered 2015-04-09: 80 ug/min via INTRAVENOUS
  Administered 2015-04-10: 200 ug/min via INTRAVENOUS
  Administered 2015-04-10: 180 ug/min via INTRAVENOUS
  Administered 2015-04-10: 400 ug/min via INTRAVENOUS
  Administered 2015-04-10: 60 ug/min via INTRAVENOUS
  Administered 2015-04-11 (×4): 400 ug/min via INTRAVENOUS
  Filled 2015-04-04 (×31): qty 4

## 2015-04-04 MED ORDER — PANTOPRAZOLE SODIUM 40 MG PO PACK
40.0000 mg | PACK | ORAL | Status: DC
  Administered 2015-04-04 – 2015-04-09 (×6): 40 mg
  Filled 2015-04-04 (×8): qty 20

## 2015-04-04 MED ORDER — INSULIN ASPART 100 UNIT/ML ~~LOC~~ SOLN
0.0000 [IU] | SUBCUTANEOUS | Status: DC
  Administered 2015-04-04: 3 [IU] via SUBCUTANEOUS
  Administered 2015-04-04 – 2015-04-08 (×13): 1 [IU] via SUBCUTANEOUS
  Administered 2015-04-09 (×2): 2 [IU] via SUBCUTANEOUS
  Administered 2015-04-09 (×3): 1 [IU] via SUBCUTANEOUS
  Administered 2015-04-09 – 2015-04-10 (×6): 2 [IU] via SUBCUTANEOUS
  Administered 2015-04-11 (×3): 1 [IU] via SUBCUTANEOUS

## 2015-04-04 MED ORDER — PIPERACILLIN-TAZOBACTAM IN DEX 2-0.25 GM/50ML IV SOLN
2.2500 g | Freq: Four times a day (QID) | INTRAVENOUS | Status: DC
  Filled 2015-04-04 (×2): qty 50

## 2015-04-04 MED ORDER — VASOPRESSIN 20 UNIT/ML IV SOLN: 0.0100 [IU]/min | INTRAVENOUS | Status: DC

## 2015-04-04 MED ORDER — SODIUM CHLORIDE 0.9 % IV SOLN
500.0000 mg | Freq: Two times a day (BID) | INTRAVENOUS | Status: DC
  Administered 2015-04-05 – 2015-04-06 (×4): 500 mg via INTRAVENOUS
  Filled 2015-04-04 (×4): qty 5

## 2015-04-04 MED ORDER — MIDAZOLAM HCL 2 MG/2ML IJ SOLN
2.0000 mg | INTRAMUSCULAR | Status: DC | PRN
  Administered 2015-04-04 – 2015-04-09 (×9): 2 mg via INTRAVENOUS
  Filled 2015-04-04 (×8): qty 2

## 2015-04-04 MED ORDER — ACETAMINOPHEN 160 MG/5ML PO SOLN: 650.0000 mg | Freq: Four times a day (QID) | ORAL | Status: DC | PRN

## 2015-04-04 MED ORDER — VANCOMYCIN HCL 10 G IV SOLR
1250.0000 mg | Freq: Once | INTRAVENOUS | Status: AC
  Administered 2015-04-04: 1250 mg via INTRAVENOUS
  Filled 2015-04-04: qty 1250

## 2015-04-04 MED ORDER — PRISMASOL BGK 4/2.5 32-4-2.5 MEQ/L IV SOLN
INTRAVENOUS | Status: DC
  Administered 2015-04-04 – 2015-04-11 (×41): via INTRAVENOUS_CENTRAL
  Filled 2015-04-04 (×59): qty 5000

## 2015-04-04 MED ORDER — VANCOMYCIN HCL IN DEXTROSE 750-5 MG/150ML-% IV SOLN
750.0000 mg | INTRAVENOUS | Status: DC
  Administered 2015-04-05 – 2015-04-06 (×2): 750 mg via INTRAVENOUS
  Filled 2015-04-04 (×3): qty 150

## 2015-04-04 MED ORDER — DEXTROSE 50 % IV SOLN
INTRAVENOUS | Status: AC
  Administered 2015-04-04: 25 mL via INTRAVENOUS
  Filled 2015-04-04: qty 50

## 2015-04-04 MED ORDER — DEXTROSE-NACL 5-0.9 % IV SOLN
INTRAVENOUS | Status: DC
  Administered 2015-04-04: 11:00:00 via INTRAVENOUS
  Filled 2015-04-04: qty 1000

## 2015-04-04 MED ORDER — LORAZEPAM 2 MG/ML IJ SOLN
INTRAMUSCULAR | Status: AC
  Administered 2015-04-04: 2 mg
  Filled 2015-04-04: qty 1

## 2015-04-04 MED ORDER — MIDAZOLAM HCL 2 MG/2ML IJ SOLN
INTRAMUSCULAR | Status: AC
  Filled 2015-04-04: qty 2

## 2015-04-04 MED ORDER — SODIUM CHLORIDE 0.9 % IV SOLN
INTRAVENOUS | Status: DC
  Administered 2015-04-04: 50 mL/h via INTRAVENOUS
  Administered 2015-04-05 – 2015-04-08 (×2): via INTRAVENOUS
  Administered 2015-04-09: 10 mL/h via INTRAVENOUS

## 2015-04-04 MED ORDER — HEPARIN SODIUM (PORCINE) 1000 UNIT/ML DIALYSIS
1000.0000 [IU] | INTRAMUSCULAR | Status: DC | PRN
  Filled 2015-04-04: qty 6

## 2015-04-04 MED ORDER — FENTANYL CITRATE (PF) 100 MCG/2ML IJ SOLN
100.0000 ug | INTRAMUSCULAR | Status: DC | PRN
  Administered 2015-04-04 – 2015-04-09 (×25): 100 ug via INTRAVENOUS
  Filled 2015-04-04 (×25): qty 2

## 2015-04-04 MED ORDER — SODIUM BICARBONATE 8.4 % IV SOLN
50.0000 meq | Freq: Once | INTRAVENOUS | Status: AC
  Administered 2015-04-04: 50 meq via INTRAVENOUS

## 2015-04-04 MED ORDER — LORAZEPAM 2 MG/ML IJ SOLN: 2.0000 mg | INTRAMUSCULAR | Status: DC | PRN

## 2015-04-04 MED ORDER — LORAZEPAM 2 MG/ML IJ SOLN
INTRAMUSCULAR | Status: AC
  Filled 2015-04-04: qty 1

## 2015-04-04 MED ORDER — SODIUM BICARBONATE 8.4 % IV SOLN
INTRAVENOUS | Status: AC
  Filled 2015-04-04: qty 50

## 2015-04-04 MED ORDER — CHLORHEXIDINE GLUCONATE 0.12 % MT SOLN
15.0000 mL | Freq: Two times a day (BID) | OROMUCOSAL | Status: DC
  Administered 2015-04-04 – 2015-04-11 (×14): 15 mL via OROMUCOSAL
  Filled 2015-04-04 (×14): qty 15

## 2015-04-04 MED ORDER — NOREPINEPHRINE BITARTRATE 1 MG/ML IV SOLN
2.0000 ug/min | INTRAVENOUS | Status: DC
  Administered 2015-04-04 (×2): 15 ug/min via INTRAVENOUS
  Administered 2015-04-05: 5 ug/min via INTRAVENOUS
  Administered 2015-04-05: 6 ug/min via INTRAVENOUS
  Administered 2015-04-06: 7 ug/min via INTRAVENOUS
  Administered 2015-04-06: 4.5 ug/min via INTRAVENOUS
  Filled 2015-04-04 (×7): qty 4

## 2015-04-04 MED ORDER — HEPARIN (PORCINE) IN NACL 100-0.45 UNIT/ML-% IJ SOLN
800.0000 [IU]/h | INTRAMUSCULAR | Status: DC
  Administered 2015-04-04: 800 [IU]/h via INTRAVENOUS
  Filled 2015-04-04: qty 250

## 2015-04-04 MED ORDER — ATORVASTATIN CALCIUM 10 MG PO TABS
10.0000 mg | ORAL_TABLET | Freq: Every day | ORAL | Status: DC
  Administered 2015-04-05 – 2015-04-09 (×5): 10 mg
  Filled 2015-04-04 (×8): qty 1

## 2015-04-04 MED ORDER — PIPERACILLIN-TAZOBACTAM IN DEX 2-0.25 GM/50ML IV SOLN
2.2500 g | Freq: Four times a day (QID) | INTRAVENOUS | Status: DC
  Administered 2015-04-04 – 2015-04-07 (×11): 2.25 g via INTRAVENOUS
  Filled 2015-04-04 (×13): qty 50

## 2015-04-04 NOTE — Progress Notes (Signed)
Chaplain responded to code blue.  No family at bedside and pt not responsive.  Chaplain will care for family upon arrival.    04/04/15 1200  Clinical Encounter Type  Visited With Patient not available;Health care provider  Visit Type Code;Critical Care  Referral From Care management   Geralyn Flash 04/04/2015 12:15 PM

## 2015-04-04 NOTE — Progress Notes (Signed)
ABG and lab results called to Dr Ashok Cordia  Orders received 50 meq Bicarb given IV

## 2015-04-04 NOTE — Progress Notes (Signed)
eLink Physician-Brief Progress Note Patient Name: TALLY MATTOX DOB: Jan 03, 1956 MRN: [id number]   Date of Service  04/04/2015  HPI/Events of Note  Spoke with Dr. Joelyn Oms regarding ongoing acidosis.  eICU Interventions  Confirms plan to start CVVH tonight.     Intervention Category Minor Interventions: Communication with other healthcare providers and/or family  Tera Partridge 04/04/2015, 3:38 PM

## 2015-04-04 NOTE — Progress Notes (Signed)
CRITICAL VALUE ALERT  Critical value received:  Troponin 0.86  Date of notification:  04/04/15  Time of notification:  1033  Critical value read back:Yes.    Nurse who received alert:  Nettie Elm, RN  MD notified (1st page):  Dr. Halford Chessman  Time of first page:  49  MD notified (2nd page):  Time of second page:  Responding MD:  Dr. Halford Chessman  Time MD responded:  1050

## 2015-04-04 NOTE — Progress Notes (Signed)
Admit: 04/09/2015 LOS: 4  29F ESRD on iHD with SIRS  Subjective:  Still req low dose Phenyl gtt AAO, on 4L [place] Labs reviewed, K WNL On midodrine Micro data NGTD   07/21 0701 - 07/22 0700 In: 2252.5 [P.O.:340; I.V.:1262.5; IV Piggyback:650] Out: 7 [Urine:2; Emesis/NG output:1; Stool:4]  Filed Weights   03/29/2015 1256 03/18/2015 1520 03/15/2015 1957  Weight: 83.915 kg (185 lb) 84.2 kg (185 lb 10 oz) 83.1 kg (183 lb 3.2 oz)    Scheduled Meds: . amiodarone  100 mg Oral Daily  . antiseptic oral rinse  7 mL Mouth Rinse BID  . aspirin EC  81 mg Oral Daily  . atorvastatin  10 mg Oral q1800  . cinacalcet  60 mg Oral Q supper  . collagenase   Topical Daily  . ferric gluconate (FERRLECIT/NULECIT) IV  62.5 mg Intravenous Weekly  . insulin aspart  0-15 Units Subcutaneous TID WC  . midodrine  10 mg Oral TID WC  . multivitamin  1 tablet Oral QHS  . piperacillin-tazobactam (ZOSYN)  IV  2.25 g Intravenous 3 times per day  . pregabalin  25 mg Oral Q1400  . sevelamer carbonate  1,600 mg Oral Q lunch  . sodium chloride  3 mL Intravenous Q12H  . Warfarin - Pharmacist Dosing Inpatient   Does not apply q1800   Continuous Infusions: . phenylephrine (NEO-SYNEPHRINE) Adult infusion 40 mcg/min (04/04/15 0609)   PRN Meds:.acetaminophen **OR** acetaminophen, alum & mag hydroxide-simeth, diphenoxylate-atropine, hydrOXYzine, ondansetron **OR** ondansetron (ZOFRAN) IV, senna-docusate  Current Labs: reviewed    Physical Exam:  Blood pressure 111/65, pulse 30, temperature 97.4 F (36.3 C), temperature source Axillary, resp. rate 29, height 4\' 8"  (1.422 m), weight 83.1 kg (183 lb 3.2 oz), SpO2 69 %. GEN: NAD CV: RRR no rub AVF: +B/T LUNGS: CTAB AAO X3  Dialysis Orders: Center: [facility] on MWF . EDW 81 kg HD Bath 3.0 K 2.5 Ca Time 4 hours Heparin 8000 units. Access RFA AVF BFR 400 DFR 800  Calcitriol .25 mcg PO q MWF Venofer 50 mg IV q week with HD  A/P 1. ESRD 1. MWF as outpt, attempt gentle iHD  today 2/2 low BP  2. Hold bolus heparin, on warfarin 3. Using AVF; if HD needed might be CRRT and would req HD Catheter -- try to avoid 2. ? Decubitus Wound Infection with leukocytosis and low grade fevers 1. On Vanc/Zosyn, WBC worsened 2. Wound GS no bactereia seen 3. BCx and Wound Cx pending 4. Per CCM 3. Vol/BP and hypotension on phenyl gtt / SIRS 1. Using bed weights 2. Chronic low BP 3. On midodrine, might need albumin with HD 4. Anemia: stable not on ESA 5. MBD: cotn outpt meds 6. AFib 1. On warfarin, presented with supratherapeutic INR, holding coumadin per TRH 2. No heparin with HD  [name] MD 04/04/2015, 8:06 AM   Recent Labs Lab 04/02/15 0225 04/02/15 0927 04/03/15 0314 04/04/15 0605  NA 132* 132* 132* 127*  K 3.6 4.2 4.6 3.9  CL 93* 96* 95* 91*  CO2 27 24 22 22   GLUCOSE 79 65 51* 100*  BUN 20 23* 27* 30*  CREATININE 3.22* 3.44* 3.78* 4.38*  CALCIUM 7.9* 7.6* 7.7* 7.5*  PHOS 4.9* 4.8*  --   --     Recent Labs Lab 03/26/2015 0819  04/02/15 0927 04/03/15 0646 04/04/15 0605  WBC 18.5*  < > 27.9* 28.0* 23.4*  NEUTROABS 15.9*  --   --   --   --  HGB 14.3  < > 14.4 14.6 15.2*  HCT 43.2  < > 43.7 44.5 46.2*  MCV 84.2  < > 85.0 84.1 84.8  PLT 137*  < > 214 245 289  < > = values in this interval not displayed.

## 2015-04-04 NOTE — Progress Notes (Signed)
Rehab admissions - Evaluated for possible admission.  Please see rehab consult done by Dr. Letta Pate recommending SNF placement.  Call me for questions.  #[phone]

## 2015-04-04 NOTE — Procedures (Signed)
Central Venous Catheter Insertion Procedure Note [name] [id number] 1955/10/11  Procedure: Insertion of Hemodialysis catheter Indications: CRRT  Procedure Details Consent: Unable to obtain consent because of emergent medical necessity. Time Out: Verified patient identification, verified procedure, site/side was marked, verified correct patient position, special equipment/implants available, medications/allergies/relevent history reviewed, required imaging and test results available.  Performed  Maximum sterile technique was used including antiseptics, cap, gloves, gown, hand hygiene, mask and sheet. Skin prep: Chlorhexidine; local anesthetic administered A antimicrobial bonded/coated triple lumen catheter was placed in the right femoral vein due to emergent situation using the Seldinger technique.  Evaluation Blood flow good Complications: No apparent complications Patient did tolerate procedure well.  Chesley Mires, MD [facility] Pulmonary/Critical Care 04/04/2015, 1:27 PM Pager:  [phone] After 3pm call: [phone]

## 2015-04-04 NOTE — Progress Notes (Signed)
EEG completed, results pending. 

## 2015-04-04 NOTE — Consult Note (Signed)
Entered in error

## 2015-04-04 NOTE — Code Documentation (Signed)
Patient developed wide complex tachycardia with varying rhythm of VT and A fib with RVR.  She initially maintained her mental status.  She was defibrillated x one w/o response.  She was given amiodarone bolus w/o response.  She was given magnesium and lidocaine.  Her QRS complex narrowed with heart rate in normal range.  She was c/o dyspnea, and then lost her mental status.  She was intubated >> placement confirmed by auscultation and CO2 detector.  She then developed PEA with bradycardia.  She was given HCO3 and CPR with ROSC.  During the process she developed generalized seizure.  She was given ativan and keppra with resolution of seizure.Candace Mires, MD [place] 04/04/2015, 1:32 PM Pager:  [phone] After 3pm call: [phone]

## 2015-04-04 NOTE — Progress Notes (Signed)
eLink Physician-Brief Progress Note Patient Name: [name] DOB: Apr 28, 1956 MRN: [id number]   Date of Service  04/04/2015  HPI/Events of Note  Severe metabolic acidosis on 3 vasopressors. PH <7.2.  eICU Interventions  Giving 1amp bicarb & repeat ABG 1800. Paging nephrology to confirm plans for CVVH.      Intervention Category Major Interventions: Acid-Base disturbance - evaluation and management  Tera Partridge 04/04/2015, 3:25 PM

## 2015-04-04 NOTE — Progress Notes (Signed)
At bed side with Elmer Sow, RN.  Patient went unresponsive with no pulse, CPR started and code called. Code lasted approx. 15 min.  Again at 1250, Patient code called again for pulseless ness and no measurable B/P.  MD at bedside.  Second code lasted approx. 15 min as well.  During second code, patient began seizing, given ativan.

## 2015-04-04 NOTE — Progress Notes (Signed)
PULMONARY / CRITICAL CARE MEDICINE   Name: [name] MRN: [id number] DOB: 1956/08/21    ADMISSION DATE:  03/30/2015 CONSULTATION DATE:  04/01/15  REFERRING MD :  Eliseo Squires  CHIEF COMPLAINT:  Hypotension, Sepsis secondary to Decubitus Ulcers  INITIAL PRESENTATION:  59 y.o. F with ESRD (MWF) admitted to [facility] 7/18 with leukocytosis and elevated lactate. On 7/19, had persistent hypotension despite fluid boluses; therefore, PCCM called for ICU transfer.  STUDIES:  7/18 CXR >> No Acute Cardiopulmonary Disease  SIGNIFICANT EVENTS: 7/18 - admit. 7/19 - transfer to ICU for hypotension not responding to IVF boluses. Given 5mg  vit K and 2u FFP for INR 8.24.  SUBJECTIVE:   No acute events overnight. This morning was noted to return to atrial fibrillation with RVR. Also reports nausea and vomiting, however states this was related to the eggs she had this morning. Troponins and EKG were obtained.  VITAL SIGNS: Temp:  [97.4 F (36.3 C)-97.8 F (36.6 C)] 97.4 F (36.3 C) (07/22 0737) Pulse Rate:  [25-150] 30 (07/22 0700) Resp:  [14-29] 29 (07/22 0700) BP: (46-139)/(24-94) 111/65 mmHg (07/22 0700) SpO2:  [69 %-100 %] 69 % (07/22 0700) HEMODYNAMICS:   VENTILATOR SETTINGS:   INTAKE / OUTPUT:  Intake/Output Summary (Last 24 hours) at 04/04/15 0816 Last data filed at 04/04/15 0605  Gross per 24 hour  Intake 2212.5 ml  Output      7 ml  Net 2205.5 ml   PHYSICAL EXAMINATION: General:  59yo female resting comfortably in no apparent distress Neuro:  No focal deficits, alert HEENT:  MMM, PERRLA Cardiovascular:  S1 and S2 noted, no murmurs, tachycardic and irregular rhythm Lungs:  Clear to auscultation bilaterally, equal breath sounds, no wheezes Abdomen:  Soft and nondistended, bowel sounds noted, mild diffuse tenderness to palpation Musculoskeletal:  Left AKA, right BKA, right middle finger amputated Skin:  Unstageable decubitus sacral ulcers bilateral x2 (one on right and one on  left)  LABS:  CBC  Recent Labs Lab 04/02/15 0927 04/03/15 0646 04/04/15 0605  WBC 27.9* 28.0* 23.4*  HGB 14.4 14.6 15.2*  HCT 43.7 44.5 46.2*  PLT 214 245 289   Coag's  Recent Labs Lab 04/02/15 0225 04/03/15 0314 04/04/15 0605  INR 1.83* 1.36 1.65*   BMET  Recent Labs Lab 04/02/15 0927 04/03/15 0314 04/04/15 0605  NA 132* 132* 127*  K 4.2 4.6 3.9  CL 96* 95* 91*  CO2 24 22 22   BUN 23* 27* 30*  CREATININE 3.44* 3.78* 4.38*  GLUCOSE 65 51* 100*   Electrolytes  Recent Labs Lab 04/02/15 0225 04/02/15 0927 04/03/15 0314 04/04/15 0605  CALCIUM 7.9* 7.6* 7.7* 7.5*  MG 1.7  --   --   --   PHOS 4.9* 4.8*  --   --    Sepsis Markers  Recent Labs Lab 04/03/2015 1025 04/01/15 1720 04/01/15 1853  LATICACIDVEN 2.35* 2.3* 2.5*   ABG No results for input(s): PHART, PCO2ART, PO2ART in the last 168 hours. Liver Enzymes  Recent Labs Lab 04/02/15 0927  ALBUMIN 1.3*   Cardiac Enzymes  Recent Labs Lab 04/05/2015 0819  TROPONINI 0.05*   Glucose  Recent Labs Lab 04/03/15 1729 04/03/15 2204 04/03/15 2230 04/03/15 2251 04/04/15 0344 04/04/15 0736  GLUCAP 74 65 64* 99 119* 75   Imaging No results found.  ASSESSMENT / PLAN:  CARDIOVASCULAR A:  Acute on Chronic Hypotension  Elevated troponins (0.86, up from 0.05 on 7/18) Hx HLD, PVD s/p b/l LE amputations and right middle finger amputation,  sCHF (last echo from 2014 with EF 35-40%), A.fib on chronic coumadin, CAD.  P:  Neo PRN for MAP goal > 55. Will not use SBP as goal since normal BP< 90.  Continue outpatient ASA, amiodarone, atorvastatin, midodrine. Continue to trend troponin. Suspect elevation in part due to lack of dialysis. Will consult Cardiology if continues to rise. Continue Warfarin per pharmacy  PULMONARY A: OSA - on nocturnal CPAP. No acute issues  P:  Continue supplemental O2 as needed to maintain SpO2 > 92%. Continue nocturnal CPAP.  RENAL A:  ESRD (Creatinine  4.38), Anuric - M/W/F; did not fully complete dialysis Mon 7/18. Hyponatremia (132>127)  P:  Nephrology following. Will attempt gentle iHD today Dialysis MWF. Last dialysis Monday, 7/18, but did not complete D5NS @75  x12hr. Caution with fluids. Monitor BMP  GASTROINTESTINAL A:  No acute issues  P:  Renal diet.  HEMATOLOGIC A:  Leukocytosis (28>23.4)  P:  S/p 5g Vitamin K and 2u FFP  Monitor CBC Monitor INR Continue Warfarin per pharmacy  INFECTIOUS A:  Septic shock - most likely secondary to Sacral decub ulcer.  P:  BCx2 7/18 >> Wound Cx 7/18 >> Multiple organs present, none predominant  Abx: Vanc, start date 7/18, day #5 Abx: Zosyn, start date 7/18, day #5 Wound care team following.  ENDOCRINE A:  DM. Currently hypoglycemic.  Thyroid disease per history - not on meds.  P:  Moderate SSI if hyperglycemia occurs D5 NS at 75 x12hr Monitor CBG Check TSH.  NEUROLOGIC A:  Acute encephalopathy - likely due to hypotension--Improved Hx Neuropathy, TIA. P:  Continue lyrica.  Disposition: PT/OT recommends CIR vs. SNF. Rehab admissions recommends inpatient rehab consult when medically appropriate.   Family updated: None.  Interdisciplinary Family Meeting v Palliative Care Meeting: Due by: 7/26.   TODAY'S SUMMARY: Continue antibiotics. Continue Neo for pressure support, goal MAP >55. Wean Neo as able. Dialysis today. Trend troponins.  Junie Panning, [facility], PGY-2 Pager: [phone]  04/04/2015, 8:16 AM

## 2015-04-04 NOTE — Progress Notes (Signed)
Initial Nutrition Assessment  DOCUMENTATION CODES:   Morbid obesity  INTERVENTION:    If unable to extubate patient within the next 24 hours, recommend initiate TF via OGT with Vital High Protein at 25 ml/h and Prostat 30 ml BID on day 1; on day 2, d/c Prostat and increase to goal rate of 45 ml/h (1080 ml per day) to provide 1080 kcals, 95 gm protein, 903 ml free water daily.  NUTRITION DIAGNOSIS:   Increased nutrient needs related to wound healing as evidenced by estimated needs.  GOAL:   Provide needs based on ASPEN/SCCM guidelines  MONITOR:   Vent status, Labs, Weight trends, Skin  REASON FOR ASSESSMENT:   Ventilator    ASSESSMENT:   The patient was sent to the emergency room from dialysis due to palpitations and racing heart rate.Admitted on 7/18 with infected pressure ulcers. Hx of right BKA and left AKA, ESRD-on HD, DM-2, and recent right middle finger amputation.  Discussed patient in ICU rounds and with RN today. Patient developed V tach and PEA cardiac arrest; required intubation 7/22. Plans to start CRRT today per review of progress notes.   Patient is currently intubated on ventilator support Temp (24hrs), Avg:97.5 F (36.4 C), Min:97.4 F (36.3 C), Max:97.6 F (36.4 C)  Propofol: none   Labs reviewed: sodium low, magnesium and phosphorus elevated.  Diet Order:  Diet NPO time specified  Skin:  Wound (see comment) (unstageable pressure ulcers to left and right buttocks)  Last BM:  7/22  Height:   Ht Readings from Last 1 Encounters:  04/13/2015 4\' 8"  (1.422 m)    Weight:   Wt Readings from Last 1 Encounters:  03/25/2015 183 lb 3.2 oz (83.1 kg)    Ideal Body Weight:  36.3 kg  Wt Readings from Last 10 Encounters:  04/04/2015 183 lb 3.2 oz (83.1 kg)  03/05/15 181 lb (82.101 kg)  01/30/15 185 lb (83.915 kg)  12/23/14 181 lb 14.1 oz (82.5 kg)  12/12/14 180 lb 12.4 oz (82 kg)  08/22/14 185 lb 3 oz (84 kg)  06/05/14 185 lb 3 oz (84 kg)  03/06/14  182 lb 15.7 oz (83 kg)  01/22/14 184 lb 1.4 oz (83.5 kg)  01/14/14 178 lb (80.74 kg)    BMI:  48 (using adjusted weight for AKA and BKA)  Estimated Nutritional Needs:   Kcal:  035-4656  Protein:  85-100 gm  Fluid:  >/= 1.6 L  EDUCATION NEEDS:   Education needs no appropriate at this time   Molli Barrows, [place], [place], [place] Pager [phone] After Hours Pager [phone]

## 2015-04-04 NOTE — Consult Note (Signed)
CONSULT NOTE  Date: 04/04/2015               Patient Name:  [name] MRN: [id number]  DOB: 05-Sep-1956 Age / Sex: 59 y.o., female        PCP: Mauricio Po Primary Cardiologist: Tamala Julian             Referring Physician: Elsworth Soho               Reason for Consult:  ventricular tachycardia, cardiac arrest            History of Present Illness: Patient is a 59 y.o. female with a PMHx of ESRD, DM, PVD - s/p bilateral BKA, atrial fib , who was admitted to [facility] on 03/24/2015 for evaluation of sacral decubitus ulcers and hypotension .  She has been in atrial fibrillation. Today she had a cardia arrest - monitored showed VT / VF.  She has coded for a second time  .  Tele shows a very wide complex tachycardia.     Medications: Outpatient medications: Prescriptions prior to admission  Medication Sig Dispense Refill Last Dose  . acetaminophen (TYLENOL) 325 MG tablet Take 650 mg by mouth every 6 (six) hours as needed.   03/30/2015 at Unknown time  . amiodarone (PACERONE) 200 MG tablet Take 1 tablet (200 mg total) by mouth daily. (Patient taking differently: Take 100 mg by mouth daily. ) 30 tablet 11 03/30/2015 at Unknown time  . aspirin EC 81 MG tablet Take 81 mg by mouth daily.   03/30/2015 at Unknown time  . atorvastatin (LIPITOR) 10 MG tablet Take 10 mg by mouth daily at 2 PM daily at 2 PM.    03/30/2015 at Unknown time  . B Complex-C (SUPER B COMPLEX PO) Take 1 tablet by mouth daily at 2 PM daily at 2 PM.   03/30/2015 at Unknown time  . B Complex-C-Folic Acid (DIALYVITE TABLET) TABS Take 1 tablet by mouth daily at 2 PM daily at 2 PM.   3 03/30/2015 at Unknown time  . cinacalcet (SENSIPAR) 60 MG tablet Take 60 mg by mouth daily at 2 PM daily at 2 PM.    03/30/2015 at Unknown time  . diphenoxylate-atropine (LOMOTIL) 2.5-0.025 MG per tablet Take by mouth 4 (four) times daily as needed for diarrhea or loose stools.   03/30/2015 at Unknown time  . hydrOXYzine (ATARAX/VISTARIL) 25 MG tablet  Take 25 mg by mouth every 6 (six) hours as needed for itching.    03/30/2015 at Unknown time  . insulin aspart (NOVOLOG FLEXPEN) 100 UNIT/ML FlexPen Inject 2-15 Units into the skin 3 (three) times daily with meals. Per sliding scale   03/30/2015 at Unknown time  . midodrine (PROAMATINE) 10 MG tablet Take 10 mg by mouth daily at 2 PM daily at 2 PM.    03/30/2015 at Unknown time  . pregabalin (LYRICA) 25 MG capsule Take 25 mg by mouth daily at 2 PM daily at 2 PM.    03/30/2015 at Unknown time  . sevelamer carbonate (RENVELA) 800 MG tablet Take 1,600 mg by mouth daily with lunch.    03/30/2015 at Unknown time  . warfarin (COUMADIN) 4 MG tablet Take 2-4 mg by mouth See admin instructions. Take 4mg  Mon-Wed-Frid  And  Take 2mg  Sun-Tue-Thur- Sat.   03/30/2015 at Unknown time  . doxycycline (VIBRA-TABS) 100 MG tablet Take 100 mg by mouth 2 (two) times daily.  0     Current medications: Current  Facility-Administered Medications  Medication Dose Route Frequency Provider Last Rate Last Dose  . acetaminophen (TYLENOL) tablet 650 mg  650 mg Oral Q6H PRN Tanna Savoy Stinson, DO   650 mg at 04/01/15 0243   Or  . acetaminophen (TYLENOL) suppository 650 mg  650 mg Rectal Q6H PRN Truett Mainland, DO      . alum & mag hydroxide-simeth (MAALOX/MYLANTA) 200-200-20 MG/5ML suspension 30 mL  30 mL Oral Q6H PRN Tanna Savoy Stinson, DO      . amiodarone (PACERONE) tablet 100 mg  100 mg Oral Daily Courteney Lyn Mackuen, MD   100 mg at 04/04/15 1102  . antiseptic oral rinse (CPC / CETYLPYRIDINIUM CHLORIDE 0.05%) solution 7 mL  7 mL Mouth Rinse BID Juanito Doom, MD   7 mL at 04/04/15 1102  . aspirin EC tablet 81 mg  81 mg Oral Daily Tanna Savoy Stinson, DO   81 mg at 04/04/15 1101  . atorvastatin (LIPITOR) tablet 10 mg  10 mg Oral q1800 Tanna Savoy Stinson, DO   10 mg at 04/03/15 1705  . cinacalcet (SENSIPAR) tablet 60 mg  60 mg Oral Q supper Truett Mainland, DO   60 mg at 04/03/15 1635  . collagenase (SANTYL) ointment   Topical Daily  Jessica U Vann, DO      . dextrose 5 % and 0.9% NaCl 1,000 mL infusion   Intravenous Continuous Wilson N Rumley, DO 75 mL/hr at 04/04/15 1109    . diphenoxylate-atropine (LOMOTIL) 2.5-0.025 MG per tablet 1 tablet  1 tablet Oral QID PRN Tanna Savoy Stinson, DO      . fentaNYL (SUBLIMAZE) injection 100 mcg  100 mcg Intravenous Q2H PRN Chesley Mires, MD      . ferric gluconate (NULECIT) 62.5 mg in sodium chloride 0.9 % 100 mL IVPB  62.5 mg Intravenous Weekly Tanna Savoy Stinson, DO   62.5 mg at 03/23/2015 1648  . hydrOXYzine (ATARAX/VISTARIL) tablet 25 mg  25 mg Oral Q6H PRN Tanna Savoy Stinson, DO      . insulin aspart (novoLOG) injection 0-15 Units  0-15 Units Subcutaneous TID WC Tanna Savoy Stinson, DO   0 Units at 03/22/2015 1630  . levETIRAcetam (KEPPRA) 500 mg in sodium chloride 0.9 % 100 mL IVPB  500 mg Intravenous STAT Chesley Mires, MD      . midodrine (PROAMATINE) tablet 10 mg  10 mg Oral TID WC Geradine Girt, DO   10 mg at 04/04/15 0816  . multivitamin (RENA-VIT) tablet 1 tablet  1 tablet Oral QHS Rigoberto Noel, MD   1 tablet at 04/03/15 2100  . ondansetron (ZOFRAN) tablet 4 mg  4 mg Oral Q6H PRN Tanna Savoy Stinson, DO       Or  . ondansetron [facility]) injection 4 mg  4 mg Intravenous Q6H PRN Truett Mainland, DO   4 mg at 04/04/15 0608  . phenylephrine (NEO-SYNEPHRINE) 10 mg in dextrose 5 % 250 mL (0.04 mg/mL) infusion  30-200 mcg/min Intravenous Continuous McDonald N Rumley, DO 60 mL/hr at 04/04/15 1100 40 mcg/min at 04/04/15 1100  . piperacillin-tazobactam (ZOSYN) IVPB 2.25 g  2.25 g Intravenous 3 times per day Norva Riffle, RPH   2.25 g at 04/04/15 0554  . pregabalin (LYRICA) capsule 25 mg  25 mg Oral Q1400 Tanna Savoy Stinson, DO   25 mg at 04/03/15 1441  . senna-docusate (Senokot-S) tablet 1 tablet  1 tablet Oral QHS PRN Truett Mainland, DO      .  sevelamer carbonate (RENVELA) tablet 1,600 mg  1,600 mg Oral Q lunch Tanna Savoy Stinson, DO   1,600 mg at 04/03/15 1314  . sodium chloride 0.9 % injection 3 mL  3 mL  Intravenous Q12H Tanna Savoy Stinson, DO   3 mL at 04/04/15 1102  . Warfarin - Pharmacist Dosing Inpatient   Does not apply q1800 Eudelia Bunch, [facility]         Allergies  Allergen Reactions  . Neurontin [Gabapentin] Anaphylaxis    Tolerates Lyrica without issue  . Ezetimibe-Simvastatin Itching and Swelling     facial edema  . Klonopin [Clonazepam] Other (See Comments)    hallucination  . Sertraline Hcl Other (See Comments)    hallucination     Past Medical History  Diagnosis Date  . Diabetes mellitus, type 2   . Secondary hyperparathyroidism   . Ischemic cardiomyopathy     a. EF 25-30% 2011;  b.  Echocardiogram (09/01/2013): EF 30-35%, diffuse hypokinesis, MAC, mild MR, mild LAE, mild RVE, severely reduced RVSF, moderate RAE, mildly increased PASP.  [name] Pulmonary hypertension     Est PAsyst 60-Dr Linard Millers, off coumadin, CT neg for PE  . Hyperlipidemia   . Anemia   . Thyroid disease   . Pulmonary edema   . Peripheral vascular disease     a. Prior LE amputations. b. s/p prior LE bypass surgery.  . Sebaceous cyst   . 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 Richardson Dopp  . Shortness of breath     with exertion  . Hemodialysis patient     M,W,F  . TIA (transient ischemic attack) June 2014  . Ulcer of heel due to diabetes     left  . Atrial fibrillation     a. Dx 08/2013, placed on amiodarone.  . Chronic systolic CHF (congestive heart failure)   . CAD (coronary artery disease)     a. s/p CABG 2011.;  b. Lexiscan Myoview (02/2013): Intermediate risk study; EF 50%, basal inferolateral, basal anterolateral and mid inferolateral moderate ischemia; basal anterior, basal anteroseptal and mid anterior scar;  c. Type 2 NSTEMI during admission for LE revasc surgery 02/2013 in setting of SVT  . Polyneuropathy in diabetes(357.2) 10/31/2013  . Neuropathy   . Headache   . Carpal tunnel syndrome, right   . Kidney disease Dialysis MWF    ESRD secondary to DM, failed transplant 2006. Dialysis M-W-F    . OSA (obstructive sleep apnea)     uses c-papNPSG 11.23.11-AHI 64.9/hr-central and obstructive apnea  . Wound infection 03/2015    BUTTOCKS    Past Surgical History  Procedure Laterality Date  . Coronary artery bypass graft  2011    3V  . Insertion of dialysis catheter    . Above knee leg amputation Left     infection/gangrene 10/11/07  . Nasal mucosal biopsy    . Ovarian cyst surgery    . 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 Laser retinal surgery  2011  . Kidney transplant      rejection  . Abdominal angiogram Left 01/25/13  . Colonoscopy    . Cardiac catheterization    . Femoral-popliteal bypass graft Left 02/20/2013    Procedure:  FEMORAL-to below knee POPLITEAL ARTERY bypasswith saphenous vein with intraoperative angiogram ;  Surgeon: Angelia Mould, MD;  Location: [facility];  Service: Vascular;  Laterality: Left;  fem-below knee pop  . Amputation Left 03/27/2013    Procedure: AMPUTATION ABOVE KNEE;  Surgeon: Angelia Mould, MD;  Location: [facility];  Service: Vascular;  Laterality: Left;  . Tee without cardioversion N/A 09/11/2013    Procedure: TRANSESOPHAGEAL ECHOCARDIOGRAM (TEE);  Surgeon: Casandra Doffing, MD;  Location[facility] ENDOSCOPY;  Service: Cardiovascular;  Laterality: N/A;  . Cardioversion N/A 09/11/2013    Procedure: CARDIOVERSION;  Surgeon: Casandra Doffing, MD;  Location: [facility] ENDOSCOPY;  Service: Cardiovascular;  Laterality: N/A;  . Below knee leg amputation Right   . Revison of arteriovenous fistula Right 11/21/2013    Procedure: REVISON OF ARTERIOVENOUS FISTULA;  Surgeon: Rosetta Posner, MD;  Location: [facility];  Service: Vascular;  Laterality: Right;  . Cataract surgery Right 01-07-2014  . Lower extremity angiogram Left 01/25/2013    Procedure: LOWER EXTREMITY ANGIOGRAM;  Surgeon: Conrad Doniphan, MD;  Location: [facility] CATH LAB;  Service: Cardiovascular;  Laterality: Left;  . Colonoscopy with propofol N/A 12/12/2014    Procedure: COLONOSCOPY WITH  PROPOFOL;  Surgeon: Laurence Spates, MD;  Location: [place];  Service: Endoscopy;  Laterality: N/A;  . Eye surgery    . Eye surgery Left November 28, 2014    Laser   . I&d extremity Right 01/30/2015    Procedure: A CELL PLACEMENT, RIGHT LONG FINGER WITH DEBRIDEMENT OF WOUND ;  Surgeon: Leanora Cover, MD;  Location: [place];  Service: Orthopedics;  Laterality: Right;  . Amputation Right 03/06/2015    Procedure: RIGHT LONG FINGER AMPUTATION ;  Surgeon: Leanora Cover, MD;  Location: [place];  Service: Orthopedics;  Laterality: Right;    Family History  Problem Relation Age of Onset  . Heart disease    . Edema Father     fluid overload  . Diabetes Father   . Cerebral aneurysm Mother   . Diabetes Mother   . Stroke Brother   . Heart disease Brother   . Diabetes Brother   . Hyperlipidemia Brother   . Hypertension Brother   . Peripheral vascular disease Brother     amputation    Social History:  reports that she has never smoked. She has quit using smokeless tobacco. Her smokeless tobacco use included Snuff and Chew. She reports that she does not drink alcohol or use illicit drugs.   Review of Systems: not able to be obtained due to code                                     Physical Exam: BP 96/70 mmHg  Pulse 63  Temp(Src) 97.4 F (36.3 C) (Axillary)  Resp 23  Ht 4\' 8"  (1.422 m)  Wt 83.1 kg (183 lb 3.2 oz)  BMI 41.10 kg/m2  SpO2 86%  Wt Readings from Last 3 Encounters:  03/22/2015 83.1 kg (183 lb 3.2 oz)  03/05/15 82.101 kg (181 lb)  01/30/15 83.915 kg (185 lb)    General: Vital signs reviewed and noted. Chronically ill appearing.  Intubated.   Head: Intubated   Neck: Thick   Lungs:  On the vent   Heart: Irreg. Irreg.   Abdomen/ GI :  Obese   MSK: Unable to assess   Extremities: S/p bil. BKA   Neurologic:  unresponsive ,  Was seizing at one point   Psych: NA    Lab results: Basic Metabolic Panel:  Recent Labs Lab  04/02/15 0225 04/02/15 0927 04/03/15 0314 04/04/15 0605  NA 132* 132* 132* 127*  K 3.6 4.2 4.6 3.9  CL 93* 96* 95* 91*  CO2 27 24 22 22   GLUCOSE 79 65 51* 100*  BUN 20 23* 27* 30*  CREATININE 3.22* 3.44* 3.78* 4.38*  CALCIUM 7.9* 7.6* 7.7* 7.5*  MG 1.7  --   --   --   PHOS 4.9* 4.8*  --   --     Liver Function Tests:  Recent Labs Lab 04/02/15 0927  ALBUMIN 1.3*   No results for input(s): LIPASE, AMYLASE in the last 168 hours. No results for input(s): AMMONIA in the last 168 hours.  CBC:  Recent Labs Lab 04/04/2015 0819 04/01/15 0434 04/02/15 0225 04/02/15 0927 04/03/15 0646 04/04/15 0605  WBC 18.5* 19.5* 25.9* 27.9* 28.0* 23.4*  NEUTROABS 15.9*  --   --   --   --   --   HGB 14.3 13.1 14.2 14.4 14.6 15.2*  HCT 43.2 40.3 43.8 43.7 44.5 46.2*  MCV 84.2 85.2 85.5 85.0 84.1 84.8  PLT 137* 138* 221 214 245 289    Cardiac Enzymes:  Recent Labs Lab 03/14/2015 0819 04/04/15 0922  TROPONINI 0.05* 0.86*    BNP: Invalid input(s): POCBNP  CBG:  Recent Labs Lab 04/03/15 2230 04/03/15 2251 04/04/15 0344 04/04/15 0736 04/04/15 1137  GLUCAP 64* 99 119* 75 75    Coagulation Studies:  Recent Labs  04/02/15 0225 04/03/15 0314 04/04/15 0605  LABPROT 21.1* 16.9* 19.5*  INR 1.83* 1.36 1.65*     Other results:  Personal review of EKG shows :  Tele  : atrial fib,  intermittant VT    Imaging:  No results found.    Assessment & Plan:  1. Ventricular tachycardia :  Seemed to have responded to bicarb. Her rhythm is a very wide complex - agonal like rhythm.    I would draw new labs. Correct any abnormalities that can be corrected.  She has been re-bolused with amio I agree that Lidocaine probably is not needed at this point .    Thayer Headings, Brooke Bonito., MD, [facility] 04/04/2015, 1:00 PM Office - [phone] Pager [phone]

## 2015-04-04 NOTE — Progress Notes (Signed)
Chaplain present with family in consult room, escorted to bedside where MD updated on pt's status.  Chaplain provided emotional support through ministry of presence and empathetic listening.  Chaplain will follow up as needed.    04/04/15 1244  Clinical Encounter Type  Visited With Family;Patient not available;Health care provider  Visit Type Initial;Code;Critical Care  Spiritual Encounters  Spiritual Needs Emotional  Stress Factors  Family Stress Factors Family relationships;Health changes   Geralyn Flash 04/04/2015 12:46 PM

## 2015-04-04 NOTE — Progress Notes (Signed)
Resident Gerlean Ren, DO abg results given.

## 2015-04-04 NOTE — Code Documentation (Signed)
  Patient Name: [name]   MRN: [id number]   Date of Birth/ Sex: [birth year] , female      Admission Date: 03/28/2015  Attending Provider: Rigoberto Noel, MD  Primary Diagnosis: Wound infection   Indication: Pt was in her usual state of health until this PM, when she was noted to be in PEA. Code blue was subsequently called. At the time of arrival on scene, ACLS protocol was underway.   Technical Description:  - CPR performance duration:  15  minutes  - Was defibrillation or cardioversion used? Yes   - Was external pacer placed? No  - Was patient intubated pre/post CPR? Yes   Medications Administered: Y = Yes; Blank = No Amiodarone  Y  Atropine    Calcium    Epinephrine  Y  Lidocaine    Magnesium    Norepinephrine    Phenylephrine    Sodium bicarbonate    Vasopressin     Post CPR evaluation:  - Final Status - Was patient successfully resuscitated ? Yes - What is current rhythm? Sinus tachycardia - What is current hemodynamic status? Unstable, improving  Miscellaneous Information:  - Labs sent, including: Workup continuing per primary team  - Primary team notified?  Yes  - Family Notified? Yes  - Additional notes/ transfer status: Primary team/PCCM at bedside at resolution     Collier Salina, MD  04/04/2015, 12:58 PM

## 2015-04-04 NOTE — Procedures (Signed)
Intubation Procedure Note SUETTA HOFFMEISTER [id number] [birth year]  Procedure: Intubation Indications: Respiratory insufficiency  Procedure Details Consent: Unable to obtain consent because of emergent medical necessity. Time Out: Verified patient identification, verified procedure, site/side was marked, verified correct patient position, special equipment/implants available, medications/allergies/relevent history reviewed, required imaging and test results available.  Performed  Maximum sterile technique was used including gloves and hand hygiene.  MAC and 3    Evaluation Hemodynamic Status: Persistent hypotension treated with pressors; O2 sats: transiently fell during during procedure Patient's Current Condition: unstable Complications: No apparent complications Patient did tolerate procedure well. Chest X-ray ordered to verify placement.  CXR: pending.  Chesley Mires, MD [facility] Pulmonary/Critical Care 04/04/2015, 1:25 PM Pager:  [phone] After 3pm call: [phone]

## 2015-04-04 NOTE — Progress Notes (Signed)
ANTICOAGULATION CONSULT NOTE - Follow Up Consult  Pharmacy Consult for Heparin & Vancomycin + Zosyn Indication: atrial fibrillation & empiric coverage of sacral wound  Allergies  Allergen Reactions  . Neurontin [Gabapentin] Anaphylaxis    Tolerates Lyrica without issue  . Ezetimibe-Simvastatin Itching and Swelling     facial edema  . Klonopin [Clonazepam] Other (See Comments)    hallucination  . Sertraline Hcl Other (See Comments)    hallucination    Patient Measurements: Height: 4\' 8"  (142.2 cm) Weight: 183 lb 3.2 oz (83.1 kg) IBW/kg (Calculated) : 36.3 Heparin Dosing Weight: 56.6 kg  Vital Signs: Temp: 97.4 F (36.3 C) (07/22 0737) Temp Source: Axillary (07/22 0737) BP: 96/70 mmHg (07/22 1237) Pulse Rate: 63 (07/22 1235)  Labs:  Recent Labs  04/02/15 0225 04/02/15 0927 04/03/15 0314 04/03/15 0646 04/04/15 0605 04/04/15 0922  HGB 14.2 14.4  --  14.6 15.2*  --   HCT 43.8 43.7  --  44.5 46.2*  --   PLT 221 214  --  245 289  --   LABPROT 21.1*  --  16.9*  --  19.5*  --   INR 1.83*  --  1.36  --  1.65*  --   CREATININE 3.22* 3.44* 3.78*  --  4.38*  --   TROPONINI  --   --   --   --   --  0.86*    Estimated Creatinine Clearance: 11.9 mL/min (by C-G formula based on Cr of 4.38).   Assessment: 77 YOF with ESRD on warfarin PTA for hx Afib - held on admission due to an elevated INR of 6.97 on PTA dose of 2 mg daily EXCEPT for 4 mg on MWF. The INR was reversed on 7/19 with 2 units FFP and Vit K 5 mg. Warfarin was resumed on 7/21. The patient received 1 dose with an INR rise up to 1.65 << 1.36. On 7/22 AM, the patient was noted to be in Afib with RVR and later that day the patient coded. Pharmacy has now been consulted to transition to Heparin for anticoagulation with Afib. Stat INR this afternoon resulted as 2.3. Discussed with CCM - will hold heparin for now and plan to start when INR <2. Will recheck with AM labs.   The patient also continues on Vancomycin + Zosyn for  empiric sacral wound coverage. The patient's last Vancomycin dose was after HD on Monday, 7/18. A Vancomycin random level was taken this afternoon to help determine a baseline in case CRRT is started. Will follow-up on this as it results.   Goal of Therapy:  Heparin level 0.3-0.7 units/ml Monitor platelets by anticoagulation protocol: Yes   Plan:  1. Hold Heparin for now 2. Will follow-up with the Vancomycin random and renal plans for CRRT and address additional Vancomycin doses at that time. 3. Continue Zosyn 2.25g IV every 8 hours 4. Will check PT/INR in the AM and address heparin start at that time  Alycia Rossetti, PharmD, [place] Pharmacist Pager: [phone] 04/04/2015 1:30 PM

## 2015-04-04 NOTE — Progress Notes (Signed)
ANTIBIOTIC CONSULT NOTE  Pharmacy Consult for vancomycin  Indication: sacral wound  Allergies  Allergen Reactions  . Neurontin [Gabapentin] Anaphylaxis    Tolerates Lyrica without issue  . Ezetimibe-Simvastatin Itching and Swelling     facial edema  . Klonopin [Clonazepam] Other (See Comments)    hallucination  . Sertraline Hcl Other (See Comments)    hallucination    Patient Measurements: Height: 4\' 8"  (142.2 cm) Weight: 185 lb 3 oz (84 kg) (bed with air mattress machine removed) IBW/kg (Calculated) : 36.3 Adjusted Body Weight:   Vital Signs: Temp: 99.4 F (37.4 C) (07/22 1640) Temp Source: Axillary (07/22 1640) BP: 126/75 mmHg (07/22 1830) Pulse Rate: 26 (07/22 1815) Intake/Output from previous day: 07/21 0701 - 07/22 0700 In: 2252.5 [P.O.:340; I.V.:1262.5; IV Piggyback:650] Out: 7 [Urine:2; Emesis/NG output:1; Stool:4] Intake/Output from this shift: Total I/O In: 1985.1 [I.V.:1135.1; NG/GT:50; IV Piggyback:800] Out: 401 [Emesis/NG output:401]  Labs:  Recent Labs  04/03/15 0314 04/03/15 0646 04/04/15 0605 04/04/15 1325  WBC  --  28.0* 23.4* 32.5*  HGB  --  14.6 15.2* 14.2  PLT  --  245 289 278  CREATININE 3.78*  --  4.38* 4.46*   Estimated Creatinine Clearance: 11.7 mL/min (by C-G formula based on Cr of 4.46).  Recent Labs  04/04/15 1501  VANCORANDOM 10     Medical History: Past Medical History  Diagnosis Date  . Diabetes mellitus, type 2   . Secondary hyperparathyroidism   . Ischemic cardiomyopathy     a. EF 25-30% 2011;  b.  Echocardiogram (09/01/2013): EF 30-35%, diffuse hypokinesis, MAC, mild MR, mild LAE, mild RVE, severely reduced RVSF, moderate RAE, mildly increased PASP.  [name] Pulmonary hypertension     Est PAsyst 60-Dr Linard Millers, off coumadin, CT neg for PE  . Hyperlipidemia   . Anemia   . Thyroid disease   . Pulmonary edema   . Peripheral vascular disease     a. Prior LE amputations. b. s/p prior LE bypass surgery.  . Sebaceous cyst    . 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 Richardson Dopp  . Shortness of breath     with exertion  . Hemodialysis patient     M,W,F  . TIA (transient ischemic attack) June 2014  . Ulcer of heel due to diabetes     left  . Atrial fibrillation     a. Dx 08/2013, placed on amiodarone.  . Chronic systolic CHF (congestive heart failure)   . CAD (coronary artery disease)     a. s/p CABG 2011.;  b. Lexiscan Myoview (02/2013): Intermediate risk study; EF 50%, basal inferolateral, basal anterolateral and mid inferolateral moderate ischemia; basal anterior, basal anteroseptal and mid anterior scar;  c. Type 2 NSTEMI during admission for LE revasc surgery 02/2013 in setting of SVT  . Polyneuropathy in diabetes(357.2) 10/31/2013  . Neuropathy   . Headache   . Carpal tunnel syndrome, right   . Kidney disease Dialysis MWF    ESRD secondary to DM, failed transplant 2006. Dialysis M-W-F  . OSA (obstructive sleep apnea)     uses c-papNPSG 11.23.11-AHI 64.9/hr-central and obstructive apnea  . Wound infection 03/2015    BUTTOCKS    Medications:  Anti-infectives    Start     Dose/Rate Route Frequency Ordered Stop   04/05/15 1800  vancomycin (VANCOCIN) IVPB 750 mg/150 ml premix     750 mg 150 mL/hr over 60 Minutes Intravenous Every 24 hours 04/04/15 1649     04/04/15 1730  vancomycin (  VANCOCIN) 1,250 mg in sodium chloride 0.9 % 250 mL IVPB     1,250 mg 166.7 mL/hr over 90 Minutes Intravenous  Once 04/04/15 1644     04/02/15 1200  vancomycin (VANCOCIN) IVPB 1000 mg/200 mL premix  Status:  Discontinued     1,000 mg 200 mL/hr over 60 Minutes Intravenous Every M-W-F (Hemodialysis) 03/19/2015 1617 04/02/15 1210   03/27/2015 2200  piperacillin-tazobactam (ZOSYN) IVPB 2.25 g     2.25 g 100 mL/hr over 30 Minutes Intravenous 3 times per day 04/04/2015 1432     04/08/2015 2000  vancomycin (VANCOCIN) IVPB 1000 mg/200 mL premix  Status:  Discontinued     1,000 mg 200 mL/hr over 60 Minutes Intravenous Every M-W-F  (Hemodialysis) 03/27/2015 1432 04/01/2015 1617   03/25/2015 1700  vancomycin (VANCOCIN) 1,250 mg in sodium chloride 0.9 % 250 mL IVPB     1,250 mg 166.7 mL/hr over 90 Minutes Intravenous  Once 03/22/2015 1617 04/06/2015 1912   04/02/2015 1445  vancomycin (VANCOCIN) IVPB 750 mg/150 ml premix  Status:  Discontinued     750 mg 150 mL/hr over 60 Minutes Intravenous  Once 04/05/2015 1432 04/12/2015 1617   03/23/2015 1430  doxycycline (VIBRA-TABS) tablet 100 mg  Status:  Discontinued     100 mg Oral 2 times daily 03/24/2015 1358 04/01/15 0821   03/14/2015 1100  piperacillin-tazobactam (ZOSYN) IVPB 2.25 g     2.25 g 100 mL/hr over 30 Minutes Intravenous  Once 03/19/2015 1057 03/17/2015 1247   03/17/2015 1045  vancomycin (VANCOCIN) IVPB 1000 mg/200 mL premix     1,000 mg 200 mL/hr over 60 Minutes Intravenous  Once 03/26/2015 1035 04/07/2015 1207     Assessment: Day #5 of vancomcyin and zosyn for empiric coverage of sacral wound.  Vancomycin random level is subtherapeutic (10 mcg/ml). Pt is ESRD on HD-MWF, pt is now transitioning to CRRT.   Vanc 7/18 > needs adjusted per HD schedule Zosyn 7/18 >  7/18 Sacral wound > Multiple organisms present, none predominant. No staph aureus, no GAS isolated 7/18 BCx > NG x3d 7/18 MRSA PCR - NEG  Goal of Therapy:  Vancomycin random 15-25 mcg/ml  Plan:  -Change zosyn to 2.25 g IV q6h -Vanc load 1250 mg IV x1 then 750/24h -Obtain VR as needed -Monitor CRRT, cultures, duration of therapy   Hughes Better, PharmD, BCPS Clinical Pharmacist Pager: [phone] 04/04/2015 6:54 PM

## 2015-04-04 NOTE — Procedures (Signed)
Arterial Catheter Insertion Procedure Note XIOMARA SEVILLANO [id number] June 28, 1956  Procedure: Insertion of Arterial Catheter  Indications: Blood pressure monitoring  Procedure Details Consent: Unable to obtain consent because of emergent medical necessity. Time Out: Verified patient identification, verified procedure, site/side was marked, verified correct patient position, special equipment/implants available, medications/allergies/relevent history reviewed, required imaging and test results available.  Performed  Maximum sterile technique was used including antiseptics, cap, gloves, gown, hand hygiene, mask and sheet. Skin prep: Chlorhexidine; local anesthetic administered 22 gauge catheter was inserted into right femoral artery using the Seldinger technique.  Evaluation Blood flow good; BP tracing good. Complications: No apparent complications.   Chesley Mires, MD [facility] Pulmonary/Critical Care 04/04/2015, 1:26 PM Pager:  [phone] After 3pm call: [phone]

## 2015-04-04 NOTE — Progress Notes (Signed)
PULMONARY / CRITICAL CARE MEDICINE   Name: [name] MRN: [id number] DOB: [birth year]    ADMISSION DATE:  04/04/2015 CONSULTATION DATE:  04/01/15  REFERRING MD :  Eliseo Squires  CHIEF COMPLAINT:  Hypotension, Sepsis secondary to Decubitus Ulcers  INITIAL PRESENTATION:  59 y.o. F with ESRD (MWF) admitted to [facility] 7/18 with leukocytosis and elevated lactate. On 7/19, had persistent hypotension despite fluid boluses; therefore, PCCM called for ICU transfer.  STUDIES:   SIGNIFICANT EVENTS: 7/18 - admit. 7/19 - transfer to ICU for hypotension not responding to IVF boluses. Given 5mg  vit K and 2u FFP for INR 8.24. 7/22 - VT/PEA cardiac arrest, VDRF, seizure  SUBJECTIVE:  She was doing well until around noon today.  Developed VT arrest.  VITAL SIGNS: Temp:  [97.4 F (36.3 C)-97.8 F (36.6 C)] 97.4 F (36.3 C) (07/22 0737) Pulse Rate:  [25-150] 60 (07/22 1315) Resp:  [14-33] 23 (07/22 1235) BP: (36-139)/(24-112) 84/63 mmHg (07/22 1320) SpO2:  [25 %-100 %] 87 % (07/22 1315) FiO2 (%):  [100 %] 100 % (07/22 1235) HEMODYNAMICS:   VENTILATOR SETTINGS: Vent Mode:  [-] PRVC FiO2 (%):  [100 %] 100 % Set Rate:  [20 bmp] 20 bmp Vt Set:  [500 mL] 500 mL PEEP:  [5 cmH20] 5 cmH20 Plateau Pressure:  [22 cmH20] 22 cmH20 INTAKE / OUTPUT:  Intake/Output Summary (Last 24 hours) at 04/04/15 1332 Last data filed at 04/04/15 1100  Gross per 24 hour  Intake 1517.5 ml  Output      7 ml  Net 1510.5 ml   PHYSICAL EXAMINATION: General: ill appearing Neuro: comatose HEENT: ETT in place Cardiovascular: irregular Lungs: scattered crackles Abdomen:  Soft, no organomegaly Musculoskeletal:  Left AKA, right BKA, right middle finger amputated Skin:  Unstageable decubitus sacral ulcers bilateral x2 (one on right and one on left)  LABS:  CBC  Recent Labs Lab 04/02/15 0927 04/03/15 0646 04/04/15 0605  WBC 27.9* 28.0* 23.4*  HGB 14.4 14.6 15.2*  HCT 43.7 44.5 46.2*  PLT 214 245 289    Coag's  Recent Labs Lab 04/02/15 0225 04/03/15 0314 04/04/15 0605  INR 1.83* 1.36 1.65*   BMET  Recent Labs Lab 04/02/15 0927 04/03/15 0314 04/04/15 0605  NA 132* 132* 127*  K 4.2 4.6 3.9  CL 96* 95* 91*  CO2 24 22 22   BUN 23* 27* 30*  CREATININE 3.44* 3.78* 4.38*  GLUCOSE 65 51* 100*   Electrolytes  Recent Labs Lab 04/02/15 0225 04/02/15 0927 04/03/15 0314 04/04/15 0605  CALCIUM 7.9* 7.6* 7.7* 7.5*  MG 1.7  --   --   --   PHOS 4.9* 4.8*  --   --    Sepsis Markers  Recent Labs Lab 04/01/15 1720 04/01/15 1853 04/04/15 1327  LATICACIDVEN 2.3* 2.5* 8.85*   ABG No results for input(s): PHART, PCO2ART, PO2ART in the last 168 hours.   Liver Enzymes  Recent Labs Lab 04/02/15 0927  ALBUMIN 1.3*   Cardiac Enzymes  Recent Labs Lab 04/04/2015 0819 04/04/15 0922  TROPONINI 0.05* 0.86*   Glucose  Recent Labs Lab 04/03/15 2204 04/03/15 2230 04/03/15 2251 04/04/15 0344 04/04/15 0736 04/04/15 1137  GLUCAP 65 64* 99 119* 75 75   Imaging No results found.   ASSESSMENT / PLAN:  CARDIOVASCULAR A:  VT/PEA arrest 7/22 >> likely from metabolic derangements. A fib with RVR on coumadin as outpt. Septic shock. Chronic hypotension. Chronic systolic CHF >> baseline EF 35 to 40%. Hx of CAD. P:  Continue amiodarone  Hold coumadin and have pharmacy dose heparin gtt Continue ASA, atorvastatin Hold midodrine for now Phenylephrine to keep MAP > 60 Hold midodrine  PULMONARY A: Acute respiratory failure in setting of cardiac arrest. Hx of OSA. P:  Full vent support F/u CXR, ABG  RENAL A:  ESRD - MWF. Lactic acidosis in setting of cardiac arrest. P:  Will start CRRT 7/22 F/u lactic acid level  GASTROINTESTINAL A:  Nutrition. P:  NPO Protonix for SUP  HEMATOLOGIC A:  Leukocytosis (28>23.4). P:  F/u CBC  INFECTIOUS A:  Septic shock - most likely secondary to Sacral decub ulcer. P:  BCx2 7/18 >> Wound Cx  7/18 >> Multiple organs present, none predominant  Abx: Vanc, start date 7/18, day #5 Abx: Zosyn, start date 7/18, day #5 Wound care team following.  ENDOCRINE A:  DM type II. P:  SSI  NEUROLOGIC A:  Acute encephalopathy initially from sepsis. 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 Hx Neuropathy, TIA. P:  Hold lyrica RASS goal 0 Continue keppra F/u EEG Might need CT head, neuro evaluation if no improvement  Updated pt's family about sequence of events today.  Continue all medical therapies for now.  CC time by me independent of resident time, APP time, and procedure time is 60 minutes.  Chesley Mires, MD [place] Pulmonary/Critical Care 04/04/2015, 1:41 PM Pager:  [phone] After 3pm call: [phone]

## 2015-04-05 ENCOUNTER — Inpatient Hospital Stay (HOSPITAL_COMMUNITY): Payer: Medicare Other

## 2015-04-05 DIAGNOSIS — Z4659 Encounter for fitting and adjustment of other gastrointestinal appliance and device: Secondary | ICD-10-CM

## 2015-04-05 DIAGNOSIS — I469 Cardiac arrest, cause unspecified: Secondary | ICD-10-CM

## 2015-04-05 DIAGNOSIS — J9601 Acute respiratory failure with hypoxia: Secondary | ICD-10-CM

## 2015-04-05 LAB — TROPONIN I
TROPONIN I: 1.29 ng/mL — AB (ref <0.031)
TROPONIN I: 1.05 ng/mL — AB (ref <0.031)
Troponin I: 1.23 ng/mL (ref <0.031)

## 2015-04-05 LAB — RENAL FUNCTION PANEL
ALBUMIN: 1.2 g/dL — AB (ref 3.5–5.0)
ANION GAP: 11 (ref 5–15)
Albumin: 1.2 g/dL — ABNORMAL LOW (ref 3.5–5.0)
Anion gap: 11 (ref 5–15)
BUN: 21 mg/dL — ABNORMAL HIGH (ref 6–20)
BUN: 17 mg/dL (ref 6–20)
CALCIUM: 6.8 mg/dL — AB (ref 8.9–10.3)
CO2: 19 mmol/L — ABNORMAL LOW (ref 22–32)
CO2: 20 mmol/L — ABNORMAL LOW (ref 22–32)
Calcium: 6.9 mg/dL — ABNORMAL LOW (ref 8.9–10.3)
Chloride: 97 mmol/L — ABNORMAL LOW (ref 101–111)
Chloride: 98 mmol/L — ABNORMAL LOW (ref 101–111)
Creatinine, Ser: 3.01 mg/dL — ABNORMAL HIGH (ref 0.44–1.00)
Creatinine, Ser: 2.66 mg/dL — ABNORMAL HIGH (ref 0.44–1.00)
GFR calc Af Amer: 21 mL/min — ABNORMAL LOW (ref >60)
GFR calc non Af Amer: 16 mL/min — ABNORMAL LOW (ref >60)
GFR, EST AFRICAN AMERICAN: 18 mL/min — AB (ref >60)
GFR, EST NON AFRICAN AMERICAN: 18 mL/min — AB (ref >60)
Glucose, Bld: 133 mg/dL — ABNORMAL HIGH (ref 65–99)
Glucose, Bld: 96 mg/dL (ref 65–99)
PHOSPHORUS: 3.3 mg/dL (ref 2.5–4.6)
POTASSIUM: 3.6 mmol/L (ref 3.5–5.1)
Phosphorus: 2.6 mg/dL (ref 2.5–4.6)
Potassium: 3.4 mmol/L — ABNORMAL LOW (ref 3.5–5.1)
SODIUM: 127 mmol/L — AB (ref 135–145)
Sodium: 129 mmol/L — ABNORMAL LOW (ref 135–145)

## 2015-04-05 LAB — COMPREHENSIVE METABOLIC PANEL
ALBUMIN: 1.2 g/dL — AB (ref 3.5–5.0)
ALK PHOS: 74 U/L (ref 38–126)
ALT: 86 U/L — ABNORMAL HIGH (ref 14–54)
AST: 164 U/L — ABNORMAL HIGH (ref 15–41)
Anion gap: 11 (ref 5–15)
BILIRUBIN TOTAL: 1.3 mg/dL — AB (ref 0.3–1.2)
BUN: 22 mg/dL — ABNORMAL HIGH (ref 6–20)
CO2: 18 mmol/L — ABNORMAL LOW (ref 22–32)
Calcium: 6.9 mg/dL — ABNORMAL LOW (ref 8.9–10.3)
Chloride: 97 mmol/L — ABNORMAL LOW (ref 101–111)
Creatinine, Ser: 2.99 mg/dL — ABNORMAL HIGH (ref 0.44–1.00)
GFR calc Af Amer: 18 mL/min — ABNORMAL LOW (ref >60)
GFR, EST NON AFRICAN AMERICAN: 16 mL/min — AB (ref >60)
Glucose, Bld: 131 mg/dL — ABNORMAL HIGH (ref 65–99)
Potassium: 3.5 mmol/L (ref 3.5–5.1)
SODIUM: 126 mmol/L — AB (ref 135–145)
Total Protein: 5 g/dL — ABNORMAL LOW (ref 6.5–8.1)

## 2015-04-05 LAB — GLUCOSE, CAPILLARY
GLUCOSE-CAPILLARY: 117 mg/dL — AB (ref 65–99)
GLUCOSE-CAPILLARY: 121 mg/dL — AB (ref 65–99)
GLUCOSE-CAPILLARY: 93 mg/dL (ref 65–99)
Glucose-Capillary: 119 mg/dL — ABNORMAL HIGH (ref 65–99)
Glucose-Capillary: 107 mg/dL — ABNORMAL HIGH (ref 65–99)

## 2015-04-05 LAB — BLOOD GAS, ARTERIAL
Acid-base deficit: 3.2 mmol/L — ABNORMAL HIGH (ref 0.0–2.0)
BICARBONATE: 19.8 meq/L — AB (ref 20.0–24.0)
Drawn by: 252031
FIO2: 0.6 %
MECHVT: 500 mL
O2 Saturation: 98.8 %
PEEP: 5 cmH2O
PH ART: 7.48 — AB (ref 7.350–7.450)
Patient temperature: 98.6
RATE: 20 resp/min
TCO2: 20.6 mmol/L (ref 0–100)
pCO2 arterial: 26.9 mmHg — ABNORMAL LOW (ref 35.0–45.0)
pO2, Arterial: 174 mmHg — ABNORMAL HIGH (ref 80.0–100.0)

## 2015-04-05 LAB — PROTIME-INR
INR: 2.64 — AB (ref 0.00–1.49)
INR: 2.81 — AB (ref 0.00–1.49)
Prothrombin Time: 27.8 seconds — ABNORMAL HIGH (ref 11.6–15.2)
Prothrombin Time: 29.1 seconds — ABNORMAL HIGH (ref 11.6–15.2)

## 2015-04-05 LAB — CULTURE, BLOOD (ROUTINE X 2)
CULTURE: NO GROWTH
CULTURE: NO GROWTH
Culture: NO GROWTH

## 2015-04-05 LAB — CBC
HEMATOCRIT: 44.1 % (ref 36.0–46.0)
Hemoglobin: 15.2 g/dL — ABNORMAL HIGH (ref 12.0–15.0)
MCH: 28 pg (ref 26.0–34.0)
MCHC: 34.5 g/dL (ref 30.0–36.0)
MCV: 81.4 fL (ref 78.0–100.0)
Platelets: 190 10*3/uL (ref 150–400)
RBC: 5.42 MIL/uL — ABNORMAL HIGH (ref 3.87–5.11)
RDW: 16.2 % — ABNORMAL HIGH (ref 11.5–15.5)
WBC: 27 10*3/uL — ABNORMAL HIGH (ref 4.0–10.5)

## 2015-04-05 LAB — MAGNESIUM: Magnesium: 2 mg/dL (ref 1.7–2.4)

## 2015-04-05 LAB — PHOSPHORUS: PHOSPHORUS: 3.3 mg/dL (ref 2.5–4.6)

## 2015-04-05 LAB — LACTIC ACID, PLASMA: Lactic Acid, Venous: 2.3 mmol/L (ref 0.5–2.0)

## 2015-04-05 LAB — APTT
APTT: 45 s — AB (ref 24–37)
aPTT: 43 seconds — ABNORMAL HIGH (ref 24–37)

## 2015-04-05 MED ORDER — MIDODRINE HCL 5 MG PO TABS
10.0000 mg | ORAL_TABLET | Freq: Three times a day (TID) | ORAL | Status: DC
  Administered 2015-04-05 – 2015-04-10 (×16): 10 mg via ORAL
  Filled 2015-04-05 (×21): qty 2

## 2015-04-05 MED ORDER — HEPARIN SODIUM (PORCINE) 1000 UNIT/ML DIALYSIS: 1000.0000 [IU] | INTRAMUSCULAR | Status: DC | PRN

## 2015-04-05 MED ORDER — SODIUM CHLORIDE 0.9 % IJ SOLN
250.0000 [IU]/h | INTRAMUSCULAR | Status: DC
  Filled 2015-04-05: qty 2

## 2015-04-05 MED ORDER — HEPARIN BOLUS VIA INFUSION (CRRT)
1000.0000 [IU] | INTRAVENOUS | Status: DC | PRN
Start: 2015-04-05 — End: 2015-04-06
  Filled 2015-04-05: qty 1000

## 2015-04-05 NOTE — Procedures (Signed)
History: Status post cardiac arrest and seizure  Sedation: Occasional fentanyl boluses  Technique: This is a 19 channel routine scalp EEG performed at the bedside with bipolar and monopolar montages arranged in accordance to the international 10/20 system of electrode placement. One channel was dedicated to EKG recording.    Background: The background consists of generalized intermixed irregular delta and theta activities. This pattern continues throughout the recording.  Photic stimulation: Physiologic driving is not performed  EEG Abnormalities: 1) generalized irregular slow activity  Clinical Interpretation: This EEG is consistent with a generalized nonspecific cerebral dysfunction. There was no seizure or seizure predisposition recorded on this study.   Roland Rack, MD [facility] [phone]  If 7pm- 7am, please page neurology on call as listed in [place].

## 2015-04-05 NOTE — Procedures (Signed)
Admit: 03/19/2015 LOS: 5  67F ESRD admitted with SIRS and ? Decubitus wound infection s/p VT/Torsades code 04/03/14  Current CRRT Prescription: Start Date: 04/04/15 Catheter: R Femoral placed 7/22, temp cath BFR: 219mL/hr Pre Blood Pump: 1000 4K DFR: 1000 4K Replacement Rate: 500 4K Goal UF: net even Anticoagulation: none at current time Clotting: some   S: Code yesterday VT/Torsades Hypotensive in time surrounding code On NE and VP now Started CRRT, clotted x2 w/o heparin in system Warfarin on hold, INR 2.64 this AM  O: 07/22 0701 - 07/23 0700 In: 3218.2 [I.V.:2163.2; NG/GT:50; IV Piggyback:1005] Out: 2038 [Emesis/NG output:551]  Filed Weights   03/25/2015 1957 04/04/15 1700 04/05/15 0417  Weight: 83.1 kg (183 lb 3.2 oz) 84 kg (185 lb 3 oz) 86.2 kg (190 lb 0.6 oz)     Recent Labs Lab 04/02/15 0927  04/04/15 1325 04/05/15 0405 04/05/15 0406  NA 132*  < > 129* 126* 127*  K 4.2  < > 3.5 3.5 3.6  CL 96*  < > 95* 97* 97*  CO2 24  < > 16* 18* 19*  GLUCOSE 65  < > 187* 131* 133*  BUN 23*  < > 31* 22* 21*  CREATININE 3.44*  < > 4.46* 2.99* 3.01*  CALCIUM 7.6*  < > 7.0* 6.9* 6.9*  PHOS 4.8*  --   --  3.3 3.3  < > = values in this interval not displayed.  Recent Labs Lab 04/09/2015 0819  04/04/15 0605 04/04/15 1325 04/05/15 0405  WBC 18.5*  < > 23.4* 32.5* 27.0*  NEUTROABS 15.9*  --   --   --   --   HGB 14.3  < > 15.2* 14.2 15.2*  HCT 43.2  < > 46.2* 43.6 44.1  MCV 84.2  < > 84.8 86.2 81.4  PLT 137*  < > 289 278 190  < > = values in this interval not displayed.  Scheduled Meds: . amiodarone  100 mg Per Tube Daily  . antiseptic oral rinse  7 mL Mouth Rinse QID  . aspirin EC  81 mg Oral Daily  . atorvastatin  10 mg Per Tube q1800  . chlorhexidine  15 mL Mouth Rinse BID  . cinacalcet  60 mg Oral Q supper  . collagenase   Topical Daily  . ferric gluconate (FERRLECIT/NULECIT) IV  62.5 mg Intravenous Weekly  . insulin aspart  0-9 Units Subcutaneous 6 times per day   . levETIRAcetam  500 mg Intravenous Q12H  . multivitamin  1 tablet Oral QHS  . pantoprazole sodium  40 mg Per Tube Q24H  . piperacillin-tazobactam (ZOSYN)  IV  2.25 g Intravenous 4 times per day  . sevelamer carbonate  1,600 mg Oral Q lunch  . sodium chloride  3 mL Intravenous Q12H  . vancomycin  750 mg Intravenous Q24H   Continuous Infusions: . sodium chloride 50 mL/hr (04/04/15 1425)  . heparin 10,000 units/ 20 mL infusion syringe    . norepinephrine (LEVOPHED) Adult infusion 5 mcg/min (04/05/15 0452)  . phenylephrine (NEO-SYNEPHRINE) Adult infusion Stopped (04/04/15 1900)  . dialysis replacement fluid (prismasate) 1,000 mL/hr at 04/05/15 0436  . dialysis replacement fluid (prismasate) 500 mL/hr at 04/05/15 0438  . dialysate (PRISMASATE) 1,000 mL/hr at 04/05/15 0439  . vasopressin (PITRESSIN) infusion - *FOR SHOCK* 0.03 Units/min (04/04/15 1900)   PRN Meds:.acetaminophen (TYLENOL) oral liquid 160 mg/5 mL, fentaNYL (SUBLIMAZE) injection, heparin, heparin, hydrOXYzine, LORazepam, midazolam, [DISCONTINUED] ondansetron **OR** ondansetron (ZOFRAN) IV, senna-docusate, sodium chloride  ABG  Component Value Date/Time   PHART 7.480* 04/05/2015 0305   PCO2ART 26.9* 04/05/2015 0305   PO2ART 174* 04/05/2015 0305   HCO3 19.8* 04/05/2015 0305   TCO2 20.6 04/05/2015 0305   ACIDBASEDEF 3.2* 04/05/2015 0305   O2SAT 98.8 04/05/2015 0305   Dialysis Orders: Center: [facility] on MWF . EDW 81 kg HD [place] 3.0 K 2.5 Ca Time 4 hours Heparin 8000 units. Access RFA AVF BFR 400 DFR 800  Calcitriol .25 mcg PO q MWF Venofer 50 mg IV q week with HD  A/P 1. ESRD 1. Started CRRT post code / hypotension 7/22 2. Cont CRRT today 3. Inc pre and reduce post fluids to help with clotting 4. INR therapeutic, hold heparin in system right now, might need 5. Cont with net even UF 6. HCO3 slowly corrected 7. Serum Na should slowly correct also 2. S/p VTach/Torsades 04/04/15 1. Per CCM 2. Improved pressor req  this AM 3. Leukocytosis, hypotension, ? Decubitus Wound Infection with leukocytosis and low grade fevers  1. On Vanc/Zosyn,  2. Wound GS no bactereia seen 3. BCx and Wound Cx NGTD 4. Per CCM 4. Vol/BP and hypotension on phenyl gtt / SIRS 1. Keeping even currenlty 5. Anemia: stable not on ESA 6. MBD: cotn outpt meds 7. AFib 1. AC per CCM 2. INR therapeutic 3. As above  [name], MD [employer] pgr [phone]

## 2015-04-05 NOTE — Progress Notes (Signed)
PROGRESS NOTE  Subjective:    Pt is better today . Coded several times yesterday  - likely due to metabolitic derangements.  Better this am  On CVVH  On amio PO   Objective:    Vital Signs:   Temp:  [94 F (34.4 C)-99.4 F (37.4 C)] 94 F (34.4 C) (07/23 0757) Pulse Rate:  [25-137] 89 (07/23 0938) Resp:  [16-33] 21 (07/23 0938) BP: (39-139)/(24-116) 87/53 mmHg (07/23 0938) SpO2:  [25 %-94 %] 94 % (07/23 0938) Arterial Line BP: (46-346)/(30-68) 94/50 mmHg (07/23 0900) FiO2 (%):  [40 %-100 %] 40 % (07/23 0938) Weight:  [84 kg (185 lb 3 oz)-86.2 kg (190 lb 0.6 oz)] 86.2 kg (190 lb 0.6 oz) (07/23 0417)  Last BM Date: 03/30/15   24-hour weight change: Weight change:   Weight trends: Filed Weights   03/27/2015 1957 04/04/15 1700 04/05/15 0417  Weight: 83.1 kg (183 lb 3.2 oz) 84 kg (185 lb 3 oz) 86.2 kg (190 lb 0.6 oz)    Intake/Output:  07/22 0701 - 07/23 0700 In: 3218.2 [I.V.:2163.2; NG/GT:50; IV Piggyback:1005] Out: 2038 [Emesis/NG output:551] Total I/O In: 152.6 [I.V.:152.6] Out: 252 [Other:252]   Physical Exam: BP 87/53 mmHg  Pulse 89  Temp(Src) 94 F (34.4 C) (Oral)  Resp 21  Ht 4\' 8"  (1.422 m)  Wt 86.2 kg (190 lb 0.6 oz)  BMI 42.63 kg/m2  SpO2 94%  Wt Readings from Last 3 Encounters:  04/05/15 86.2 kg (190 lb 0.6 oz)  03/05/15 82.101 kg (181 lb)  01/30/15 83.915 kg (185 lb)    General: Vital signs reviewed and noted. Intubated, on the vent, getting CVVH, warming blanket on   Head: Normocephalic, atraumatic.  Eyes: conjunctivae/corneas clear.  EOM's intact.   Throat: normal  Neck:    Lungs:    on vent   Heart:  RR   Abdomen:  Obese   Extremities: S/p bil BKA ,     Neurologic: Sedated   Psych: NA    Labs: BMET:  Recent Labs  04/04/15 1325 04/05/15 0405 04/05/15 0406  NA 129* 126* 127*  K 3.5 3.5 3.6  CL 95* 97* 97*  CO2 16* 18* 19*  GLUCOSE 187* 131* 133*  BUN 31* 22* 21*  CREATININE 4.46* 2.99* 3.01*  CALCIUM 7.0* 6.9*  6.9*  MG 2.5* 2.0  --   PHOS  --  3.3 3.3    Liver function tests:  Recent Labs  04/04/15 1325 04/05/15 0405 04/05/15 0406  AST 116* 164*  --   ALT 49 86*  --   ALKPHOS 75 74  --   BILITOT 1.1 1.3*  --   PROT 4.9* 5.0*  --   ALBUMIN 1.1* 1.2* 1.2*   No results for input(s): LIPASE, AMYLASE in the last 72 hours.  CBC:  Recent Labs  04/04/15 1325 04/05/15 0405  WBC 32.5* 27.0*  HGB 14.2 15.2*  HCT 43.6 44.1  MCV 86.2 81.4  PLT 278 190    Cardiac Enzymes:  Recent Labs  04/04/15 1325 04/04/15 2031 04/05/15 0405 04/05/15 0806  TROPONINI 1.04* 1.40* 1.29* 1.23*    Coagulation Studies:  Recent Labs  04/03/15 0314 04/04/15 0605 04/04/15 1325 04/05/15 0405  LABPROT 16.9* 19.5* 25.0* 27.8*  INR 1.36 1.65* 2.30* 2.64*    Other: Invalid input(s): POCBNP No results for input(s): DDIMER in the last 72 hours. No results for input(s): HGBA1C in the last 72 hours. No results for input(s): CHOL, HDL, LDLCALC, TRIG,  CHOLHDL in the last 72 hours. No results for input(s): TSH, T4TOTAL, T3FREE, THYROIDAB in the last 72 hours.  Invalid input(s): FREET3 No results for input(s): VITAMINB12, FOLATE, FERRITIN, TIBC, IRON, RETICCTPCT in the last 72 hours.   Other results:  EKG  ( personally reviewed )  -tele - NSR   Medications:    Infusions: . sodium chloride 50 mL/hr (04/04/15 1425)  . heparin 10,000 units/ 20 mL infusion syringe    . norepinephrine (LEVOPHED) Adult infusion 4 mcg/min (04/05/15 0900)  . phenylephrine (NEO-SYNEPHRINE) Adult infusion Stopped (04/04/15 1900)  . dialysis replacement fluid (prismasate) 1,500 mL/hr at 04/05/15 0900  . dialysis replacement fluid (prismasate) 300 mL/hr at 04/05/15 0726  . dialysate (PRISMASATE) 1,000 mL/hr at 04/05/15 0954  . vasopressin (PITRESSIN) infusion - *FOR SHOCK* 0.03 Units/min (04/04/15 1900)    Scheduled Medications: . amiodarone  100 mg Per Tube Daily  . antiseptic oral rinse  7 mL Mouth Rinse QID  .  aspirin EC  81 mg Oral Daily  . atorvastatin  10 mg Per Tube q1800  . chlorhexidine  15 mL Mouth Rinse BID  . collagenase   Topical Daily  . insulin aspart  0-9 Units Subcutaneous 6 times per day  . levETIRAcetam  500 mg Intravenous Q12H  . midodrine  10 mg Oral TID WC  . multivitamin  1 tablet Oral QHS  . pantoprazole sodium  40 mg Per Tube Q24H  . piperacillin-tazobactam (ZOSYN)  IV  2.25 g Intravenous 4 times per day  . sodium chloride  3 mL Intravenous Q12H  . vancomycin  750 mg Intravenous Q24H    Assessment/ Plan:   Principal Problem:   Wound infection Active Problems:   Unilateral AKA--left   Leucocytosis   Hx of right BKA   Atrial fibrillation with RVR   Elevated INR (international normalized ratio) due to prior anticoagulant medication ingestion   Pressure ulcer   Acute respiratory failure with hypoxia   Cardiac arrest   1. V-fib / torsades de pointes.:  Due to metabolic issues.  Stable from a cardiac standpoint. She has multiple severe medical issues.  Would continue current meds.   Will sign off. Call for questions .    Disposition:  Length of Stay: 5  Thayer Headings, Brooke Bonito., MD, [facility] 04/05/2015, 10:31 AM Office [phone] Pager [phone]

## 2015-04-05 NOTE — Progress Notes (Signed)
PULMONARY / CRITICAL CARE MEDICINE   Name: [name] MRN: [id number] DOB: 19-Dec-1955    ADMISSION DATE:  03/30/2015 CONSULTATION DATE:  04/01/15  REFERRING MD :  Eliseo Squires  CHIEF COMPLAINT:  Hypotension, Sepsis secondary to Decubitus Ulcers  INITIAL PRESENTATION:  59 y.o. F with ESRD (MWF) admitted to [facility] 7/18 with leukocytosis and elevated lactate. On 7/19, had persistent hypotension despite fluid boluses; therefore, PCCM called for ICU transfer.  STUDIES:  7/18 CXR >> No acute cardiopulmonary disease 7/22 CXR >>ETT and NG tube in place, opacity of left lung apex consolidation vs airspace collapse 7/22 KUB >> NG tube and a line in place 7/23 CXR >>LUL opacity could be pneumonia  SIGNIFICANT EVENTS: 7/18 - admit. 7/19 - transfer to ICU for hypotension not responding to IVF boluses. Given 5mg  vit K and 2u FFP for INR 8.24. 7/22 - VT/PEA cardiac arrest, VDRF, seizure  SUBJECTIVE / Interval events:  Had A fib 7/22, then developed VT arrest x2 yesterday afternoon. Was intubated, received CPR and defib x 1. Max time pulseless estimated at 9 minutes total (two episodes); could have been shorter CVVHD started Possible seizure activity post-arrest Coumadin stopped 7/22 No acute events overnight.  VITAL SIGNS: Temp:  [97.3 F (36.3 C)-99.4 F (37.4 C)] 97.3 F (36.3 C) (07/22 2334) Pulse Rate:  [25-137] 92 (07/23 0325) Resp:  [16-33] 16 (07/23 0400) BP: (36-139)/(24-116) 99/77 mmHg (07/23 0400) SpO2:  [25 %-91 %] 43 % (07/23 0400) Arterial Line BP: (46-130)/(30-68) 112/61 mmHg (07/23 0400) FiO2 (%):  [40 %-100 %] 40 % (07/23 0411) Weight:  [185 lb 3 oz (84 kg)-190 lb 0.6 oz (86.2 kg)] 190 lb 0.6 oz (86.2 kg) (07/23 0417) HEMODYNAMICS:   VENTILATOR SETTINGS: Vent Mode:  [-] PRVC FiO2 (%):  [40 %-100 %] 40 % Set Rate:  [20 bmp] 20 bmp Vt Set:  [500 mL] 500 mL PEEP:  [5 cmH20] 5 cmH20 Plateau Pressure:  [17 cmH20-22 cmH20] 22 cmH20 INTAKE / OUTPUT:  Intake/Output Summary  (Last 24 hours) at 04/05/15 0615 Last data filed at 04/05/15 0600  Gross per 24 hour  Intake 3138.08 ml  Output   1869 ml  Net 1269.08 ml   PHYSICAL EXAMINATION: General: ill appearing Neuro: sedated, did not respond to pain or voice HEENT: ETT in place Cardiovascular: irregular Lungs: scattered crackles Abdomen:  Soft, no organomegaly Musculoskeletal:  Left AKA, right BKA, right middle finger amputated Skin:  Unstageable decubitus sacral ulcers bilateral x2 (one on right and one on left)  LABS:  CBC  Recent Labs Lab 04/04/15 0605 04/04/15 1325 04/05/15 0405  WBC 23.4* 32.5* 27.0*  HGB 15.2* 14.2 15.2*  HCT 46.2* 43.6 44.1  PLT 289 278 190   Coag's  Recent Labs Lab 04/04/15 0605 04/04/15 1325 04/05/15 0405 04/05/15 0427  APTT  --   --   --  45*  INR 1.65* 2.30* 2.64*  --    BMET  Recent Labs Lab 04/04/15 1325 04/05/15 0405 04/05/15 0406  NA 129* 126* 127*  K 3.5 3.5 3.6  CL 95* 97* 97*  CO2 16* 18* 19*  BUN 31* 22* 21*  CREATININE 4.46* 2.99* 3.01*  GLUCOSE 187* 131* 133*   Electrolytes  Recent Labs Lab 04/02/15 0225 04/02/15 0927  04/04/15 1325 04/05/15 0405 04/05/15 0406  CALCIUM 7.9* 7.6*  < > 7.0* 6.9* 6.9*  MG 1.7  --   --  2.5* 2.0  --   PHOS 4.9* 4.8*  --   --  3.3  3.3  < > = values in this interval not displayed. Sepsis Markers  Recent Labs Lab 04/04/15 1327 04/04/15 1800 04/05/15 0406  LATICACIDVEN 8.85* 4.3* 2.3*   ABG  Recent Labs Lab 04/04/15 1458 04/04/15 1818 04/05/15 0305  PHART 7.179* 7.367 7.480*  PCO2ART 34.4* 30.6* 26.9*  PO2ART 87.0 157.0* 174*     Liver Enzymes  Recent Labs Lab 04/04/15 1325 04/05/15 0405 04/05/15 0406  AST 116* 164*  --   ALT 49 86*  --   ALKPHOS 75 74  --   BILITOT 1.1 1.3*  --   ALBUMIN 1.1* 1.2* 1.2*   Cardiac Enzymes  Recent Labs Lab 04/04/15 1325 04/04/15 2031 04/05/15 0405  TROPONINI 1.04* 1.40* 1.29*   Glucose  Recent Labs Lab 04/04/15 1137 04/04/15 1518  04/04/15 2006 04/04/15 2022 04/04/15 2329 04/05/15 0408  GLUCAP 75 216* 68 287* 150* 121*   Imaging Dg Chest Port 1 View  04/04/2015   CLINICAL DATA:  Endotracheal tube placement.  Post CPR.  EXAM: PORTABLE CHEST - 1 VIEW  COMPARISON:  Chest x-ray dated 04/05/2015.  FINDINGS: Endotracheal tube appears well positioned with tip just above the level of the carina. Enteric tube passes below the diaphragm. Cardiac pacing pad overlies the heart shadow. Median sternotomy wires appear intact.  There is at least mild bilateral interstitial edema. There is probable consolidation versus airspace collapse within the left upper lobe and a probable small left pleural effusion, partially obscured by the overlying pad.  IMPRESSION: Endotracheal tube well positioned with tip above the level of the carina.  Enteric tube passes below the diaphragm.  Opacity at the left lung apex suspicious for consolidation versus airspace collapse.  Probable small left pleural effusion.  At least mild bilateral interstitial edema.   Electronically Signed   By: Franki Cabot M.D.   On: 04/04/2015 14:05   Dg Abd Portable 1v  04/04/2015   CLINICAL DATA:  Assess orogastric and hemodialysis catheter placement  EXAM: PORTABLE ABDOMEN - 1 VIEW  COMPARISON:  None.  FINDINGS: There is a right femoral catheter in place with the tip projecting at approximately L5 to the right of midline. The esophagogastric tube tip in proximal port lie in the region of the gastric cardia. The bowel gas pattern is unremarkable where visualized.  IMPRESSION: Interval placement of a femoral vascular catheter presumably in the femoral vein with the tip lying to the right of L5. Nasogastric tube tip in proximal port lie in the region of the gastric cardia.   Electronically Signed   By: David  Martinique M.D.   On: 04/04/2015 14:18   ASSESSMENT / PLAN:  CARDIOVASCULAR A:  VT/PEA arrest 7/22 >> likely from metabolic derangements. A fib with RVR on coumadin as  outpt Septic shock. Chronic hypotension. Chronic systolic CHF >> baseline EF 35 to 40%. Hx of CAD.  P:  Continue amiodarone Holding home coumadin. Heparin gtt to start when INR < 2.0. Continue ASA, atorvastatin Start midodrine 7/23 Off Neo Vasopressin Weaning Levophed  PULMONARY ETT 7/22>>> A: Acute respiratory failure in setting of cardiac arrest. Hx of OSA. P:  Full vent support--adjust settings as indicated  RENAL A:  ESRD - MWF. Lactic acidosis in setting of cardiac arrest.  P:  CRRT initiated 7/22>> Goal I=O with CRRT Nephrology following F/u lactic acid level NS at 50cc/hr  GASTROINTESTINAL A:  Nutrition.  P:  NPO, start TF 8/24 if not extubated Protonix for SUP  HEMATOLOGIC A:  Leukocytosis (28>23.4) INR 2.64  P:  F/u CBC Monitor INR Holding Heparin gtt--INR therapeutic  INFECTIOUS A:  Septic shock - most likely secondary to Sacral decub ulcer.  P:  BCx2 7/18 >> Wound Cx 7/18 >> Multiple organs present, none predominant  Abx: Vanc, start date 7/18, day #6 Abx: Zosyn, start date 7/18, day #6 Wound care team following.  ENDOCRINE A:  DM type II. Hypoglycemic.  P:  Moderate SSI Monitor CBG  NEUROLOGIC A:  Acute encephalopathy initially from sepsis. 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 Hx Neuropathy, TIA.  P:  Hold lyrica RASS goal 0 Continue keppra F/u EEG Might need CT head, neuro evaluation if no improvement  Dr. Junie Panning, DO Family Medicine PGY-2   Attending Note:  I have examined patient, reviewed labs, studies and notes. I have discussed the case with Dr Gerlean Ren, and I agree with the data and plans as amended above. ESRD and PVD, admitted with sepsis presumed due to decub. Course c/b VT and resp failure. Now on CVVHD and MV. Treating for possible HCAP and will assess for extubation when hemodynamically stable. Independent critical care time is 45  minutes.   Baltazar Apo, MD, PhD 04/05/2015, 10:10 AM [facility] [phone] or if no answer [phone]

## 2015-04-06 LAB — RENAL FUNCTION PANEL
ANION GAP: 8 (ref 5–15)
Albumin: 1.2 g/dL — ABNORMAL LOW (ref 3.5–5.0)
BUN: 8 mg/dL (ref 6–20)
CO2: 21 mmol/L — ABNORMAL LOW (ref 22–32)
Calcium: 7.2 mg/dL — ABNORMAL LOW (ref 8.9–10.3)
Chloride: 103 mmol/L (ref 101–111)
Creatinine, Ser: 1.64 mg/dL — ABNORMAL HIGH (ref 0.44–1.00)
GFR calc non Af Amer: 33 mL/min — ABNORMAL LOW (ref >60)
GFR, EST AFRICAN AMERICAN: 38 mL/min — AB (ref >60)
Glucose, Bld: 134 mg/dL — ABNORMAL HIGH (ref 65–99)
Phosphorus: 2 mg/dL — ABNORMAL LOW (ref 2.5–4.6)
Potassium: 3.7 mmol/L (ref 3.5–5.1)
Sodium: 132 mmol/L — ABNORMAL LOW (ref 135–145)

## 2015-04-06 LAB — CBC
HCT: 44.4 % (ref 36.0–46.0)
HEMOGLOBIN: 15.3 g/dL — AB (ref 12.0–15.0)
MCH: 27.8 pg (ref 26.0–34.0)
MCHC: 34.5 g/dL (ref 30.0–36.0)
MCV: 80.7 fL (ref 78.0–100.0)
PLATELETS: 209 10*3/uL (ref 150–400)
RBC: 5.5 MIL/uL — AB (ref 3.87–5.11)
RDW: 16.2 % — ABNORMAL HIGH (ref 11.5–15.5)
WBC: 28.3 10*3/uL — AB (ref 4.0–10.5)

## 2015-04-06 LAB — COMPREHENSIVE METABOLIC PANEL
ALBUMIN: 1.2 g/dL — AB (ref 3.5–5.0)
ALK PHOS: 87 U/L (ref 38–126)
ALT: 93 U/L — ABNORMAL HIGH (ref 14–54)
ANION GAP: 11 (ref 5–15)
AST: 112 U/L — AB (ref 15–41)
BILIRUBIN TOTAL: 1.4 mg/dL — AB (ref 0.3–1.2)
BUN: 10 mg/dL (ref 6–20)
CHLORIDE: 99 mmol/L — AB (ref 101–111)
CO2: 20 mmol/L — ABNORMAL LOW (ref 22–32)
Calcium: 7.2 mg/dL — ABNORMAL LOW (ref 8.9–10.3)
Creatinine, Ser: 1.92 mg/dL — ABNORMAL HIGH (ref 0.44–1.00)
GFR calc non Af Amer: 27 mL/min — ABNORMAL LOW (ref >60)
GFR, EST AFRICAN AMERICAN: 32 mL/min — AB (ref >60)
Glucose, Bld: 117 mg/dL — ABNORMAL HIGH (ref 65–99)
POTASSIUM: 3.8 mmol/L (ref 3.5–5.1)
Sodium: 130 mmol/L — ABNORMAL LOW (ref 135–145)
Total Protein: 5.6 g/dL — ABNORMAL LOW (ref 6.5–8.1)

## 2015-04-06 LAB — GLUCOSE, CAPILLARY
GLUCOSE-CAPILLARY: 122 mg/dL — AB (ref 65–99)
Glucose-Capillary: 96 mg/dL (ref 65–99)
Glucose-Capillary: 119 mg/dL — ABNORMAL HIGH (ref 65–99)
Glucose-Capillary: 115 mg/dL — ABNORMAL HIGH (ref 65–99)
Glucose-Capillary: 114 mg/dL — ABNORMAL HIGH (ref 65–99)
Glucose-Capillary: 126 mg/dL — ABNORMAL HIGH (ref 65–99)
Glucose-Capillary: 92 mg/dL (ref 65–99)

## 2015-04-06 LAB — MAGNESIUM
MAGNESIUM: 2 mg/dL (ref 1.7–2.4)
Magnesium: 2.2 mg/dL (ref 1.7–2.4)

## 2015-04-06 LAB — PROTIME-INR
INR: 3.08 — ABNORMAL HIGH (ref 0.00–1.49)
Prothrombin Time: 31.3 seconds — ABNORMAL HIGH (ref 11.6–15.2)

## 2015-04-06 LAB — PHOSPHORUS
Phosphorus: 2.3 mg/dL — ABNORMAL LOW (ref 2.5–4.6)
Phosphorus: 2.1 mg/dL — ABNORMAL LOW (ref 2.5–4.6)

## 2015-04-06 LAB — APTT: APTT: 45 s — AB (ref 24–37)

## 2015-04-06 MED ORDER — VITAL HIGH PROTEIN PO LIQD
1000.0000 mL | ORAL | Status: DC
  Administered 2015-04-06: 1000 mL
  Administered 2015-04-07: 06:00:00
  Filled 2015-04-06 (×3): qty 1000

## 2015-04-06 MED ORDER — PRO-STAT SUGAR FREE PO LIQD
30.0000 mL | Freq: Two times a day (BID) | ORAL | Status: DC
  Administered 2015-04-06 – 2015-04-07 (×3): 30 mL
  Filled 2015-04-06 (×4): qty 30

## 2015-04-06 MED ORDER — ASPIRIN 81 MG PO CHEW
81.0000 mg | CHEWABLE_TABLET | Freq: Every day | ORAL | Status: DC
  Administered 2015-04-06 – 2015-04-10 (×5): 81 mg
  Filled 2015-04-06 (×5): qty 1

## 2015-04-06 MED ORDER — LEVETIRACETAM 100 MG/ML PO SOLN
500.0000 mg | Freq: Two times a day (BID) | ORAL | Status: DC
  Administered 2015-04-06 – 2015-04-10 (×8): 500 mg
  Filled 2015-04-06 (×12): qty 5

## 2015-04-06 NOTE — Progress Notes (Signed)
Brief Nutrition Note  Consult received for enteral/tube feeding initiation and management.  Adult Enteral Nutrition Protocol initiated. RD to follow-up 7/25.  Admitting Dx: Wound of buttock, unspecified laterality, subsequent encounter [S31.809D]  Body mass index is 43.96 kg/(m^2). Pt meets criteria for morbid obesity based on current BMI.  Labs:   Recent Labs Lab 04/04/15 1325 04/05/15 0405 04/05/15 0406 04/05/15 1640 04/06/15 0505  NA 129* 126* 127* 129* 130*  K 3.5 3.5 3.6 3.4* 3.8  CL 95* 97* 97* 98* 99*  CO2 16* 18* 19* 20* 20*  BUN 31* 22* 21* 17 10  CREATININE 4.46* 2.99* 3.01* 2.66* 1.92*  CALCIUM 7.0* 6.9* 6.9* 6.8* 7.2*  MG 2.5* 2.0  --   --  2.0  PHOS  --  3.3 3.3 2.6 2.3*  GLUCOSE 187* 131* 133* 96 117*    Clayton Bibles, MS, RD, LDN Pager: [phone] After Hours Pager: [phone]

## 2015-04-06 NOTE — Progress Notes (Signed)
ANTICOAGULATION & ANTIBIOTIC CONSULT NOTE - Follow Up Consult  Pharmacy Consult for Vancomycin + Zosyn & Heparin (when INR<2) Indication: Empiric coverage of sacral wound & atrial fibrillation   Allergies  Allergen Reactions  . Neurontin [Gabapentin] Anaphylaxis    Tolerates Lyrica without issue  . Ezetimibe-Simvastatin Itching and Swelling     facial edema  . Klonopin [Clonazepam] Other (See Comments)    hallucination  . Sertraline Hcl Other (See Comments)    hallucination    Patient Measurements: Height: 4\' 8"  (142.2 cm) Weight: 195 lb 15.8 oz (88.9 kg) IBW/kg (Calculated) : 36.3 Heparin Dosing Weight: 58 kg  Vital Signs: Temp: 97.3 F (36.3 C) (07/24 0814) Temp Source: Oral (07/24 0814) BP: 94/45 mmHg (07/24 0820) Pulse Rate: 102 (07/24 0820)  Labs:  Recent Labs  04/04/15 1325  04/05/15 0405 04/05/15 0406 04/05/15 0427 04/05/15 0806 04/05/15 1640 04/05/15 1943 04/05/15 2209 04/06/15 0505  HGB 14.2  --  15.2*  --   --   --   --   --   --  15.3*  HCT 43.6  --  44.1  --   --   --   --   --   --  44.4  PLT 278  --  190  --   --   --   --   --   --  209  APTT  --   --   --   --  45* 43*  --   --   --  45*  LABPROT 25.0*  --  27.8*  --   --   --   --  29.1*  --  31.3*  INR 2.30*  --  2.64*  --   --   --   --  2.81*  --  3.08*  CREATININE 4.46*  --  2.99* 3.01*  --   --  2.66*  --   --  1.92*  TROPONINI 1.04*  < > 1.29*  --   --  1.23*  --   --  1.05*  --   < > = values in this interval not displayed.  Estimated Creatinine Clearance: 28.2 mL/min (by C-G formula based on Cr of 1.92).   Assessment: 89 YOF with ESRD on warfarin PTA for hx Afib - held on admission due to an elevated INR of 6.97 on PTA dose of 2 mg daily EXCEPT for 4 mg on MWF. The INR was reversed on 7/19 with 2 units FFP and Vit K 5 mg. Warfarin was resumed on 7/21. The patient received 1 dose with an INR rise up to 1.65 << 1.36. On 7/22 AM, the patient was noted to be in Afib with RVR and later that  day the patient coded. Pharmacy has now been consulted to transition to Heparin for anticoagulation with Afib when INR <2. INR this morning remains SUPRAtherapeutic (INR 3.08 << 2.81, goal of 2-3). Will continue to hold heparin at this time. Will recheck with AM labs.   The patient also continues on Vancomycin + Zosyn D#7 for empiric sacral wound coverage. The patient was being dosed intermittently with HD however on 7/22, the patient was transitioned to CRRT. A Vancomycin random was low prior to the start of CRRT - so the patient was re-loaded and started on maintenance doses. The patient is tolerating CRRT well thus far with only 1.5h of OFF time since starting. No changes in antibiotic doses needed at this time.    Vanc 7/18 >> * 7/22  VR: 10 mcg/ml (pre-start of CRRT) >> given 1250 mg x 1 and started on 750 mg/24h Zosyn 7/18 >  7/18 Sacral wound > Multiple organisms present, none predominant. No staph aureus, no GAS isolated 7/18 BCx > NG x3d 7/18 MRSA PCR - NEG  Goal of Therapy:  Appropriate anticoagulation Vancomycin level of 15-20 mcg/ml (15-25 mcg/ml once CRRT stops) Proper antibiotics for infection/cultures adjusted for renal/hepatic function    Plan:  1. Continue to hold Heparin - will plan to start when INR<2 2. Continue Vancomycin 750 mg IV every 24 hours 3. Continue Zosyn 2.25g IV every 6 hours 4. Will continue to follow CRRT tolerance and off times, culture results, LOT, and antibiotic de-escalation plans   Alycia Rossetti, PharmD, BCPS Clinical Pharmacist Pager: [phone] 04/06/2015 9:09 AM

## 2015-04-06 NOTE — Progress Notes (Signed)
PULMONARY / CRITICAL CARE MEDICINE   Name: [name] MRN: [id number] DOB: [birth year]    ADMISSION DATE:  04/10/2015 CONSULTATION DATE:  04/01/15  REFERRING MD :  Eliseo Squires  CHIEF COMPLAINT:  Hypotension, Sepsis secondary to Decubitus Ulcers  INITIAL PRESENTATION:  59 y.o. F with ESRD (MWF) admitted to [facility] 7/18 with leukocytosis and elevated lactate. On 7/19, had persistent hypotension despite fluid boluses; therefore, PCCM called for ICU transfer.  STUDIES:  7/18 CXR >> No acute cardiopulmonary disease 7/22 CXR >>ETT and NG tube in place, opacity of left lung apex consolidation vs airspace collapse 7/22 KUB >> NG tube and a line in place 7/23 CXR >>LUL opacity could be pneumonia 7/23 EEG >> no seizure activity 7/25 CXR >> improved edema, possible pneumonia in left apex  SIGNIFICANT EVENTS: 7/18 - admit. 7/19 - transfer to ICU for hypotension not responding to IVF boluses. Given 5mg  vit K and 2u FFP for INR 8.24. 7/22 - VT/PEA cardiac arrest, VDRF, seizure  Lines/Drains: HD Catheter Right Femoral 7/22 NG 7/22 ETT 7/22 Arterial Line 7/22  SUBJECTIVE / Interval events:  No acute events overnight. Awake follows commands  VITAL SIGNS: Temp:  [97.3 F (36.3 C)-98.2 F (36.8 C)] 97.4 F (36.3 C) (07/25 0429) Pulse Rate:  [25-134] 116 (07/25 0325) Resp:  [6-23] 20 (07/25 0645) BP: (72-111)/(45-74) 89/67 mmHg (07/25 0600) SpO2:  [59 %-88 %] 64 % (07/24 1900) Arterial Line BP: (85-109)/(44-55) 98/53 mmHg (07/25 0645) FiO2 (%):  [40 %] 40 % (07/25 0600) Weight:  [198 lb 6.6 oz (90 kg)] 198 lb 6.6 oz (90 kg) (07/25 0400) HEMODYNAMICS:   VENTILATOR SETTINGS: Vent Mode:  [-] PRVC FiO2 (%):  [40 %] 40 % Set Rate:  [20 bmp] 20 bmp Vt Set:  [500 mL] 500 mL PEEP:  [5 cmH20] 5 cmH20 Plateau Pressure:  [20 cmH20-23 cmH20] 21 cmH20 INTAKE / OUTPUT:  Intake/Output Summary (Last 24 hours) at 04/07/15 0705 Last data filed at 04/07/15 0600  Gross per 24 hour  Intake 3076.33 ml   Output   3164 ml  Net -87.67 ml   PHYSICAL EXAMINATION: General: ill appearing Neuro: rass 0, follows commands HEENT: ETT in place Cardiovascular: s1 s2 irregular Lungs: scattered crackles Abdomen:  Soft, no organomegaly Musculoskeletal:  Left AKA, right BKA, right middle finger amputated Skin:  Unstageable decubitus sacral ulcers bilateral x2 (one on right and one on left)  LABS:  CBC  Recent Labs Lab 04/05/15 0405 04/06/15 0505 04/07/15 0415  WBC 27.0* 28.3* 24.0*  HGB 15.2* 15.3* 14.5  HCT 44.1 44.4 43.0  PLT 190 209 175   Coag's  Recent Labs Lab 04/05/15 0806 04/05/15 1943 04/06/15 0505 04/07/15 0415  APTT 43*  --  45* 53*  INR  --  2.81* 3.08* 3.22*   BMET  Recent Labs Lab 04/06/15 0505 04/06/15 1611 04/07/15 0415  NA 130* 132* 129*  K 3.8 3.7 3.5  CL 99* 103 100*  CO2 20* 21* 22  BUN 10 8 7   CREATININE 1.92* 1.64* 1.34*  GLUCOSE 117* 134* 143*   Electrolytes  Recent Labs Lab 04/06/15 0505 04/06/15 1611 04/07/15 0415  CALCIUM 7.2* 7.2* 7.2*  MG 2.0 2.2 2.0  PHOS 2.3* 2.1*  2.0* 1.8*   Sepsis Markers  Recent Labs Lab 04/04/15 1327 04/04/15 1800 04/05/15 0406  LATICACIDVEN 8.85* 4.3* 2.3*   ABG  Recent Labs Lab 04/04/15 1458 04/04/15 1818 04/05/15 0305  PHART 7.179* 7.367 7.480*  PCO2ART 34.4* 30.6* 26.9*  PO2ART 87.0  157.0* 174*     Liver Enzymes  Recent Labs Lab 04/04/15 1325 04/05/15 0405  04/06/15 0505 04/06/15 1611 04/07/15 0415  AST 116* 164*  --  112*  --   --   ALT 49 86*  --  93*  --   --   ALKPHOS 75 74  --  87  --   --   BILITOT 1.1 1.3*  --  1.4*  --   --   ALBUMIN 1.1* 1.2*  < > 1.2* 1.2* 1.2*  < > = values in this interval not displayed. Cardiac Enzymes  Recent Labs Lab 04/05/15 0405 04/05/15 0806 04/05/15 2209  TROPONINI 1.29* 1.23* 1.05*   Glucose  Recent Labs Lab 04/06/15 0803 04/06/15 1158 04/06/15 1607 04/06/15 2017 04/07/15 0010 04/07/15 0428  GLUCAP 119* 115* 122* 126*  129* 130*   Imaging No results found. ASSESSMENT / PLAN:  CARDIOVASCULAR A:  VT/PEA arrest 7/22 >> likely from metabolic derangements. A fib with RVR on coumadin as outpt, currently on hold Septic shock. Chronic hypotension. Chronic systolic CHF >> baseline EF 35 to 40%. Hx of CAD.  P:  Continue amiodarone per tube Holding home coumadin. Heparin gtt to start when INR < 2.0. Continue ASA, atorvastatin Started midodrine 7/23 Weaning Levophed, dc this Neo, re add vaso if needed, map goal 55 with good MS Repeat cortisol  PULMONARY ETT 7/22>>> A: Acute respiratory failure in setting of cardiac arrest. Hx of OSA. HCAP   P:  Full vent support Assess for PS/ cpap, SBT today Last abg noted, keep same mV Even goals, pcxr c/w edema but improved  RENAL A:  ESRD (1.34) - pre-hospitalization dialysis schedule MWF. Hyponatremia (129)  P:  CRRT initiated 7/22>> Goal I=O with CRRT Nephrology following NS to [facility]  GASTROINTESTINAL A: r/o shock liver Nutrition.  P:  Tube Feeds Protonix for SUP LF tin am  HEMATOLOGIC A:  Leukocytosis (28.3>24) INR 3.22  P:  F/u CBC Monitor INR Holding Heparin until INR <2  INFECTIOUS A:  Septic shock - most likely secondary to Sacral decub ulcer. HCAP  P:  BCx2 7/18 >>No Growth Wound Cx 7/18 >> Multiple organs present, none predominant  Abx: Vanc, start date 7/18>>>7/25 Abx: Zosyn, start date 7/18>>> Wound care team following.  establish top date zosyn, total 10 days Dc vanc  ENDOCRINE A:  DM type II. Hypoglycemic. R/o burn out adrnal P:  Moderate SSI Monitor CBG  NEUROLOGIC A:  Acute encephalopathy initially from sepsis. 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 Hx Neuropathy, TIA.  P:  RASS goal 0 Continue keppra Ativan PRN Versed PRN  Dr. Junie Panning, DO Family Medicine, PGY-2  04/07/2015, 7:05  AM  STAFF NOTE: I, Merrie Roof, MD FACP have personally reviewed patient's available data, including medical history, events of note, physical examination and test results as part of my evaluation. I have discussed with resident/NP and other care providers such as pharmacist, RN and RRT. In addition, I personally evaluated patient and elicited key findings of: awake, follows commands, lungs coarse slight, pcxe c/w edema prior improved, wean SBT on low dose pressors, map goal 55-50, sys 85, cvvhd to even balance, assess for burn out adrenal dz, levo to off with fib and prior VT, neo, add vaso if needed, dc vanc, zosyn stop date, wound care The patient is critically ill with multiple organ systems failure and requires high complexity decision making for assessment and support, frequent evaluation and  titration of therapies, application of advanced monitoring technologies and extensive interpretation of multiple databases.   Critical Care Time devoted to patient care services described in this note is 30 Minutes. This time reflects time of care of this signee: Merrie Roof, MD FACP. This critical care time does not reflect procedure time, or teaching time or supervisory time of PA/NP/Med student/Med Resident etc but could involve care discussion time. Rest per NP/medical resident whose note is outlined above and that I agree with   Lavon Paganini. Titus Mould, MD, [place] Pgr: [facility] 04/07/2015 11:23 AM

## 2015-04-06 NOTE — Progress Notes (Signed)
PULMONARY / CRITICAL CARE MEDICINE   Name: [name] MRN: [id number] DOB: 24-Oct-1955    ADMISSION DATE:  04/10/2015 CONSULTATION DATE:  04/01/15  REFERRING MD :  Eliseo Squires  CHIEF COMPLAINT:  Hypotension, Sepsis secondary to Decubitus Ulcers  INITIAL PRESENTATION:  59 y.o. F with ESRD (MWF) admitted to [facility] 7/18 with leukocytosis and elevated lactate. On 7/19, had persistent hypotension despite fluid boluses; therefore, PCCM called for ICU transfer.  STUDIES:  7/18 CXR >> No acute cardiopulmonary disease 7/22 CXR >>ETT and NG tube in place, opacity of left lung apex consolidation vs airspace collapse 7/22 KUB >> NG tube and a line in place 7/23 CXR >>LUL opacity could be pneumonia  SIGNIFICANT EVENTS: 7/18 - admit. 7/19 - transfer to ICU for hypotension not responding to IVF boluses. Given 5mg  vit K and 2u FFP for INR 8.24. 7/22 - VT/PEA cardiac arrest, VDRF, seizure  SUBJECTIVE / Interval events:  Pressor needs improving  VITAL SIGNS: Temp:  [95 F (35 C)-97.7 F (36.5 C)] 97.3 F (36.3 C) (07/24 0814) Pulse Rate:  [26-110] 102 (07/24 0820) Resp:  [10-23] 20 (07/24 1100) BP: (75-105)/(45-71) 86/54 mmHg (07/24 1100) SpO2:  [59 %-96 %] 59 % (07/24 0800) Arterial Line BP: (80-102)/(30-52) 93/46 mmHg (07/24 1100) FiO2 (%):  [40 %] 40 % (07/24 0820) Weight:  [88.9 kg (195 lb 15.8 oz)] 88.9 kg (195 lb 15.8 oz) (07/24 0400) HEMODYNAMICS:   VENTILATOR SETTINGS: Vent Mode:  [-] PRVC FiO2 (%):  [40 %] 40 % Set Rate:  [20 bmp] 20 bmp Vt Set:  [500 mL] 500 mL PEEP:  [5 cmH20] 5 cmH20 Plateau Pressure:  [19 cmH20-24 cmH20] 23 cmH20 INTAKE / OUTPUT:  Intake/Output Summary (Last 24 hours) at 04/06/15 1139 Last data filed at 04/06/15 1100  Gross per 24 hour  Intake 3034.98 ml  Output   3186 ml  Net -151.02 ml   PHYSICAL EXAMINATION: General: ill appearing Neuro: sedated, grimace with stim HEENT: ETT in place Cardiovascular: irregular Lungs: scattered crackles Abdomen:   Soft, no organomegaly Musculoskeletal:  Left AKA, right BKA, right middle finger amputated Skin:  Unstageable decubitus sacral ulcers bilateral x2 (one on right and one on left)  LABS:  CBC  Recent Labs Lab 04/04/15 1325 04/05/15 0405 04/06/15 0505  WBC 32.5* 27.0* 28.3*  HGB 14.2 15.2* 15.3*  HCT 43.6 44.1 44.4  PLT 278 190 209   Coag's  Recent Labs Lab 04/05/15 0405 04/05/15 0427 04/05/15 0806 04/05/15 1943 04/06/15 0505  APTT  --  45* 43*  --  45*  INR 2.64*  --   --  2.81* 3.08*   BMET  Recent Labs Lab 04/05/15 0406 04/05/15 1640 04/06/15 0505  NA 127* 129* 130*  K 3.6 3.4* 3.8  CL 97* 98* 99*  CO2 19* 20* 20*  BUN 21* 17 10  CREATININE 3.01* 2.66* 1.92*  GLUCOSE 133* 96 117*   Electrolytes  Recent Labs Lab 04/04/15 1325 04/05/15 0405 04/05/15 0406 04/05/15 1640 04/06/15 0505  CALCIUM 7.0* 6.9* 6.9* 6.8* 7.2*  MG 2.5* 2.0  --   --  2.0  PHOS  --  3.3 3.3 2.6 2.3*   Sepsis Markers  Recent Labs Lab 04/04/15 1327 04/04/15 1800 04/05/15 0406  LATICACIDVEN 8.85* 4.3* 2.3*   ABG  Recent Labs Lab 04/04/15 1458 04/04/15 1818 04/05/15 0305  PHART 7.179* 7.367 7.480*  PCO2ART 34.4* 30.6* 26.9*  PO2ART 87.0 157.0* 174*     Liver Enzymes  Recent Labs Lab 04/04/15 1325  04/05/15 0405 04/05/15 0406 04/05/15 1640 04/06/15 0505  AST 116* 164*  --   --  112*  ALT 49 86*  --   --  93*  ALKPHOS 75 74  --   --  87  BILITOT 1.1 1.3*  --   --  1.4*  ALBUMIN 1.1* 1.2* 1.2* 1.2* 1.2*   Cardiac Enzymes  Recent Labs Lab 04/05/15 0405 04/05/15 0806 04/05/15 2209  TROPONINI 1.29* 1.23* 1.05*   Glucose  Recent Labs Lab 04/05/15 0408 04/05/15 0800 04/05/15 1137 04/05/15 1952 04/06/15 0405 04/06/15 0803  GLUCAP 121* 119* 93 107* 96 119*   Imaging Dg Chest Port 1 View  04/05/2015   CLINICAL DATA:  Status post cardiac arrest.  Metabolic acidosis.  EXAM: PORTABLE CHEST - 1 VIEW  COMPARISON:  Single view of the chest 04/04/2015. PA  and lateral chest 071615/2016.  FINDINGS: The patient is rotated on the study. Support apparatus appears unchanged. Interstitial edema persist. Left upper lobe opacity is again seen. No pneumothorax identified.  IMPRESSION: No change in interstitial edema in left upper lobe opacity which could be due to pneumonia.   Electronically Signed   By: Inge Rise M.D.   On: 04/05/2015 07:49   Dg Chest Port 1 View  04/04/2015   CLINICAL DATA:  Endotracheal tube placement.  Post CPR.  EXAM: PORTABLE CHEST - 1 VIEW  COMPARISON:  Chest x-ray dated 04/03/2015.  FINDINGS: Endotracheal tube appears well positioned with tip just above the level of the carina. Enteric tube passes below the diaphragm. Cardiac pacing pad overlies the heart shadow. Median sternotomy wires appear intact.  There is at least mild bilateral interstitial edema. There is probable consolidation versus airspace collapse within the left upper lobe and a probable small left pleural effusion, partially obscured by the overlying pad.  IMPRESSION: Endotracheal tube well positioned with tip above the level of the carina.  Enteric tube passes below the diaphragm.  Opacity at the left lung apex suspicious for consolidation versus airspace collapse.  Probable small left pleural effusion.  At least mild bilateral interstitial edema.   Electronically Signed   By: Franki Cabot M.D.   On: 04/04/2015 14:05   Dg Abd Portable 1v  04/04/2015   CLINICAL DATA:  Assess orogastric and hemodialysis catheter placement  EXAM: PORTABLE ABDOMEN - 1 VIEW  COMPARISON:  None.  FINDINGS: There is a right femoral catheter in place with the tip projecting at approximately L5 to the right of midline. The esophagogastric tube tip in proximal port lie in the region of the gastric cardia. The bowel gas pattern is unremarkable where visualized.  IMPRESSION: Interval placement of a femoral vascular catheter presumably in the femoral vein with the tip lying to the right of L5.  Nasogastric tube tip in proximal port lie in the region of the gastric cardia.   Electronically Signed   By: David  Martinique M.D.   On: 04/04/2015 14:18   ASSESSMENT / PLAN:  CARDIOVASCULAR A:  VT/PEA arrest 7/22 >> likely from metabolic derangements. A fib with RVR on coumadin as outpt, currently on hold Septic shock. Chronic hypotension. Chronic systolic CHF >> baseline EF 35 to 40%. Hx of CAD.  P:  Continue amiodarone per tube Holding home coumadin. Heparin gtt to start when INR < 2.0. Continue ASA, atorvastatin Started midodrine 7/23 Vasopressin off Weaning Levophed  PULMONARY ETT 7/22>>> A: Acute respiratory failure in setting of cardiac arrest. Hx of OSA. P:  Full vent support Assess for PSV 7/24,  hemodynamic stability and mental status will limit extubation  RENAL A:  ESRD - MWF. Lactic acidosis in setting of cardiac arrest.  P:  CRRT initiated 7/22>> Goal I=O with CRRT Nephrology following NS to [place] A:  Nutrition.  P:  NPO, start TF 8/24 Protonix for SUP  HEMATOLOGIC A:  Leukocytosis (28>23.4) INR 3.08 am 7/24  P:  F/u CBC Monitor INR Holding Heparin gtt--INR therapeutic  INFECTIOUS A:  Septic shock - most likely secondary to Sacral decub ulcer.  P:  BCx2 7/18 >> Wound Cx 7/18 >> Multiple organs present, none predominant  Abx: Vanc, start date 7/18, day #7 Abx: Zosyn, start date 7/18, day #7 Wound care team following.  ENDOCRINE A:  DM type II. Hypoglycemic.  P:  Moderate SSI Monitor CBG  NEUROLOGIC A:  Acute encephalopathy initially from sepsis. 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 Hx Neuropathy, TIA. - EEG 7/23 no seizure activity  P:  Hold lyrica RASS goal 0 Continue keppra Check head CT  Independent CC time 40 minutes  Baltazar Apo, MD, PhD 04/06/2015, 11:39 AM [facility] [phone] or if no answer  [phone]

## 2015-04-06 NOTE — Procedures (Signed)
Admit: 03/28/2015 LOS: 6  35F ESRD admitted with SIRS and ? Decubitus wound infection s/p VT/PEA code 04/03/14  Current CRRT Prescription: Start Date: 04/04/15 Catheter: R Femoral placed 7/22, temp cath BFR: 23mL/hr Pre Blood Pump: 1500 4K DFR: 1000 4K Replacement Rate: 300 4K Goal UF: net even Anticoagulation: none at current time Clotting: 1-2 episodes / 24h   S: No new events Remains on 2 pressors NE and PE now FOllowing commands 40% FIO2  O: 07/23 0701 - 07/24 0700 In: 2736.8 [I.V.:2081.8; NG/GT:200; IV Piggyback:455] Out: 2947 [Emesis/NG output:550]  Filed Weights   04/04/15 1700 04/05/15 0417 04/06/15 0400  Weight: 84 kg (185 lb 3 oz) 86.2 kg (190 lb 0.6 oz) 88.9 kg (195 lb 15.8 oz)     Recent Labs Lab 04/05/15 0406 04/05/15 1640 04/06/15 0505  NA 127* 129* 130*  K 3.6 3.4* 3.8  CL 97* 98* 99*  CO2 19* 20* 20*  GLUCOSE 133* 96 117*  BUN 21* 17 10  CREATININE 3.01* 2.66* 1.92*  CALCIUM 6.9* 6.8* 7.2*  PHOS 3.3 2.6 2.3*    Recent Labs Lab 03/25/2015 0819  04/04/15 1325 04/05/15 0405 04/06/15 0505  WBC 18.5*  < > 32.5* 27.0* 28.3*  NEUTROABS 15.9*  --   --   --   --   HGB 14.3  < > 14.2 15.2* 15.3*  HCT 43.2  < > 43.6 44.1 44.4  MCV 84.2  < > 86.2 81.4 80.7  PLT 137*  < > 278 190 209  < > = values in this interval not displayed.  Scheduled Meds: . amiodarone  100 mg Per Tube Daily  . antiseptic oral rinse  7 mL Mouth Rinse QID  . aspirin EC  81 mg Oral Daily  . atorvastatin  10 mg Per Tube q1800  . chlorhexidine  15 mL Mouth Rinse BID  . collagenase   Topical Daily  . insulin aspart  0-9 Units Subcutaneous 6 times per day  . levETIRAcetam  500 mg Intravenous Q12H  . midodrine  10 mg Oral TID WC  . multivitamin  1 tablet Oral QHS  . pantoprazole sodium  40 mg Per Tube Q24H  . piperacillin-tazobactam (ZOSYN)  IV  2.25 g Intravenous 4 times per day  . sodium chloride  3 mL Intravenous Q12H  . vancomycin  750 mg Intravenous Q24H   Continuous  Infusions: . sodium chloride 50 mL/hr at 04/05/15 1715  . heparin 10,000 units/ 20 mL infusion syringe    . norepinephrine (LEVOPHED) Adult infusion 5 mcg/min (04/06/15 0600)  . phenylephrine (NEO-SYNEPHRINE) Adult infusion 140 mcg/min (04/06/15 0630)  . dialysis replacement fluid (prismasate) 1,500 mL/hr at 04/06/15 0606  . dialysis replacement fluid (prismasate) 300 mL/hr at 04/05/15 1627  . dialysate (PRISMASATE) 1,000 mL/hr at 04/06/15 0214   PRN Meds:.acetaminophen (TYLENOL) oral liquid 160 mg/5 mL, fentaNYL (SUBLIMAZE) injection, heparin, heparin, hydrOXYzine, LORazepam, midazolam, [DISCONTINUED] ondansetron **OR** ondansetron (ZOFRAN) IV, sodium chloride  ABG    Component Value Date/Time   PHART 7.480* 04/05/2015 0305   PCO2ART 26.9* 04/05/2015 0305   PO2ART 174* 04/05/2015 0305   HCO3 19.8* 04/05/2015 0305   TCO2 20.6 04/05/2015 0305   ACIDBASEDEF 3.2* 04/05/2015 0305   O2SAT 98.8 04/05/2015 0305   Dialysis Orders: Center: [place] on MWF . EDW 81 kg HD [place] 3.0 K 2.5 Ca Time 4 hours Heparin 8000 units. Access RFA AVF BFR 400 DFR 800  Calcitriol .25 mcg PO q MWF Venofer 50 mg IV q week with  HD  A/P 1. ESRD 1. Started CRRT post code / hypotension 7/22 2. Cont CRRT today w/o heparin in system 3. INR >3,  4. Cont with net even UF 5. HCO3 slowly corrected 6. Serum Na should slowly correct also 2. S/p VTach/[place] 04/04/15 1. Per CCM 2. Improved pressor req this AM 3. Leukocytosis, hypotension /  Sepsis, ? Decubitus Wound Infection with leukocytosis and low grade fevers  1. On Vanc/Zosyn,  2. Wound GS no bactereia seen 3. BCx and Wound Cx NGTD 4. Per CCM 4. Vol/BP and hypotension on phenyl gtt / SIRS 1. Keeping even currenlty 5. Anemia: stable not on ESA 6. MBD: cotn outpt meds 7. AFib 1. AC per CCM 2. INR therapeutic 3. As above  [name], MD [employer] pgr [phone]

## 2015-04-07 ENCOUNTER — Inpatient Hospital Stay (HOSPITAL_COMMUNITY): Payer: Medicare Other

## 2015-04-07 DIAGNOSIS — J9600 Acute respiratory failure, unspecified whether with hypoxia or hypercapnia: Secondary | ICD-10-CM | POA: Insufficient documentation

## 2015-04-07 LAB — GLUCOSE, CAPILLARY
GLUCOSE-CAPILLARY: 130 mg/dL — AB (ref 65–99)
Glucose-Capillary: 129 mg/dL — ABNORMAL HIGH (ref 65–99)
Glucose-Capillary: 140 mg/dL — ABNORMAL HIGH (ref 65–99)
Glucose-Capillary: 92 mg/dL (ref 65–99)

## 2015-04-07 LAB — RENAL FUNCTION PANEL
Albumin: 1.2 g/dL — ABNORMAL LOW (ref 3.5–5.0)
Albumin: 1.2 g/dL — ABNORMAL LOW (ref 3.5–5.0)
Anion gap: 7 (ref 5–15)
Anion gap: 6 (ref 5–15)
BUN: 7 mg/dL (ref 6–20)
BUN: 7 mg/dL (ref 6–20)
CALCIUM: 7.3 mg/dL — AB (ref 8.9–10.3)
CO2: 22 mmol/L (ref 22–32)
CO2: 22 mmol/L (ref 22–32)
CREATININE: 1.34 mg/dL — AB (ref 0.44–1.00)
CREATININE: 1.16 mg/dL — AB (ref 0.44–1.00)
Calcium: 7.2 mg/dL — ABNORMAL LOW (ref 8.9–10.3)
Chloride: 100 mmol/L — ABNORMAL LOW (ref 101–111)
Chloride: 103 mmol/L (ref 101–111)
GFR calc Af Amer: 49 mL/min — ABNORMAL LOW (ref >60)
GFR calc non Af Amer: 50 mL/min — ABNORMAL LOW (ref >60)
GFR, EST AFRICAN AMERICAN: 58 mL/min — AB (ref >60)
GFR, EST NON AFRICAN AMERICAN: 42 mL/min — AB (ref >60)
GLUCOSE: 128 mg/dL — AB (ref 65–99)
Glucose, Bld: 143 mg/dL — ABNORMAL HIGH (ref 65–99)
POTASSIUM: 3.6 mmol/L (ref 3.5–5.1)
Phosphorus: 1.8 mg/dL — ABNORMAL LOW (ref 2.5–4.6)
Phosphorus: 1.8 mg/dL — ABNORMAL LOW (ref 2.5–4.6)
Potassium: 3.5 mmol/L (ref 3.5–5.1)
SODIUM: 129 mmol/L — AB (ref 135–145)
SODIUM: 131 mmol/L — AB (ref 135–145)

## 2015-04-07 LAB — CBC
HCT: 43 % (ref 36.0–46.0)
Hemoglobin: 14.5 g/dL (ref 12.0–15.0)
MCH: 27.8 pg (ref 26.0–34.0)
MCHC: 33.7 g/dL (ref 30.0–36.0)
MCV: 82.5 fL (ref 78.0–100.0)
Platelets: 175 10*3/uL (ref 150–400)
RBC: 5.21 MIL/uL — ABNORMAL HIGH (ref 3.87–5.11)
RDW: 17.2 % — ABNORMAL HIGH (ref 11.5–15.5)
WBC: 24 10*3/uL — ABNORMAL HIGH (ref 4.0–10.5)

## 2015-04-07 LAB — CORTISOL: CORTISOL PLASMA: 23.1 ug/dL

## 2015-04-07 LAB — PROTIME-INR
INR: 3.22 — ABNORMAL HIGH (ref 0.00–1.49)
Prothrombin Time: 32.3 seconds — ABNORMAL HIGH (ref 11.6–15.2)

## 2015-04-07 LAB — MAGNESIUM
MAGNESIUM: 2.1 mg/dL (ref 1.7–2.4)
Magnesium: 2 mg/dL (ref 1.7–2.4)

## 2015-04-07 LAB — PHOSPHORUS: PHOSPHORUS: 1.7 mg/dL — AB (ref 2.5–4.6)

## 2015-04-07 LAB — APTT: aPTT: 53 seconds — ABNORMAL HIGH (ref 24–37)

## 2015-04-07 MED ORDER — PIPERACILLIN-TAZOBACTAM IN DEX 2-0.25 GM/50ML IV SOLN: 2.2500 g | Freq: Four times a day (QID) | INTRAVENOUS | Status: DC

## 2015-04-07 MED ORDER — VASOPRESSIN 20 UNIT/ML IV SOLN
0.0300 [IU]/min | INTRAVENOUS | Status: DC
  Administered 2015-04-07 – 2015-04-11 (×5): 0.03 [IU]/min via INTRAVENOUS
  Filled 2015-04-07 (×5): qty 2

## 2015-04-07 MED ORDER — VITAL HIGH PROTEIN PO LIQD
1000.0000 mL | ORAL | Status: DC
  Administered 2015-04-07 – 2015-04-09 (×4): 1000 mL
  Administered 2015-04-10: 11:00:00
  Filled 2015-04-07 (×6): qty 1000

## 2015-04-07 MED ORDER — PIPERACILLIN-TAZOBACTAM IN DEX 2-0.25 GM/50ML IV SOLN
2.2500 g | Freq: Four times a day (QID) | INTRAVENOUS | Status: AC
Start: 2015-04-07 — End: 2015-04-09
  Administered 2015-04-07 – 2015-04-09 (×10): 2.25 g via INTRAVENOUS
  Filled 2015-04-07 (×10): qty 50

## 2015-04-07 NOTE — Clinical Social Work Note (Signed)
Clinical Social Work Assessment  Patient Details  Name: [name] MRN: [id number] Date of Birth: [birth year]  Date of referral:  04/07/15               Reason for consult:  Discharge Planning, Facility Placement                Permission sought to share information with:  Case Manager, Customer service manager, Family Supports Permission granted to share information::  Yes, Verbal Permission Granted  Name::      Marc Morgans and Designer, television/film set )  Agency::   (SNF's )  Relationship::   (Brother and Sister-In-Law )  Sport and exercise psychologist Information:   [phone] )  Housing/Transportation Living arrangements for the past 2 months:  Single Family Home Source of Information:  Patient Patient Interpreter Needed:  None Criminal Activity/Legal Involvement Pertinent to Current Situation/Hospitalization:  No - Comment as needed Significant Relationships:  Siblings Lives with:  Self Do you feel safe going back to the place where you live?  No Need for family participation in patient care:  Yes (Comment)  Care giving concerns:  Patient will likely required continued therapy and 24/7 assistance at discharge.    Social Worker assessment / plan:  Holiday representative had lengthy conversation with patient's brother and sister-in-law in reference to post-acute placement for SNF. CSW introduced CSW role and SNF process. Pt's sister-in-law expressed several questions and concerns in reference to insurance coverage and length of stay. CSW explained that SNF placement is for short-term stay only. CSW also explained traditional Medicare SNF coverage. Pt's sister-in-law reported currently pt's family is undecided on SNF placement however agreeable to SNF search. Pt's sister-in-law also indicated that pt's family is concerned in regards to pt's current medical condition. Pt's family agreeable to SNF search only in [place]. Pt's family reported they are not familiar with SNF's in the area however will plan to  visit desired SNF's once pt becomes closer to being medically stable. CSW to complete FL-2, update as needed and fax clinicals to SNF's in [facility] No further concerns reported by family at this time. CSW will continue to follow pt and pt's family for continued support and to facilitate pt's discharge needs once medically stable.    Employment status:  Retired Forensic scientist:  Medicare PT Recommendations:  Inpatient [place] / Referral to community resources:  [place]  Patient/Family's Response to care:  Pt intubated. Pt's family agreeable to SNF search however remains undecided on placement. Pt's family supportive in medical interventions and involved in pt's care. Pt's family pleasant and appreciated social work intervention.   Patient/Family's Understanding of and Emotional Response to Diagnosis, Current Treatment, and Prognosis: Patient's family aware that patient coded twice on December 26, 2022. Pt's family involved in pt's care and knowledgeable of diagnosis and current on-going treatment.   Emotional Assessment Appearance:  Appears stated age Attitude/Demeanor/Rapport:  Unable to Assess Affect (typically observed):  Unable to Assess Orientation:  Oriented to Self Alcohol / Substance use:  Not Applicable Psych involvement (Current and /or in the community):  No (Comment)  Discharge Needs  Concerns to be addressed:  Discharge Planning Concerns Readmission within the last 30 days:  No Current discharge risk:  Dependent with Mobility Barriers to Discharge:  Continued Medical Work up   [employer], MSW, LCSWA [phone] 04/07/2015 2:54 PM

## 2015-04-07 NOTE — Progress Notes (Signed)
Rexene Agent, MD Physician Signed Nephrology Procedures 04/06/2015 6:40 AM    Expand All Collapse All   Admit: 04/03/2015 LOS: 6  62F ESRD admitted with SIRS and ? Decubitus wound infection s/p VT/PEA code 04/03/14  Current CRRT Prescription: Start Date: 04/04/15 Catheter: R Femoral placed 7/22, temp cath BFR: 245mL/hr Pre Blood Pump: 1500 4K DFR: 1000 4K Replacement Rate: 300 4K Goal UF: net even Anticoagulation: none at current time Clotting: 1-2 episodes / 24h   S: No new events Remains on 2 pressors NE and PE FOllowing commands 40% FIO2  O: 07/23 0701 - 07/24 0700 In: 2736.8 [I.V.:2081.8; NG/GT:200; IV Piggyback:455] Out: 2947 [Emesis/NG output:550]  Filed Weights   04/04/15 1700 04/05/15 0417 04/06/15 0400  Weight: 84 kg (185 lb 3 oz) 86.2 kg (190 lb 0.6 oz) 88.9 kg (195 lb 15.8 oz)     Last Labs      Recent Labs Lab 04/05/15 0406 04/05/15 1640 04/06/15 0505  NA 127* 129* 130*  K 3.6 3.4* 3.8  CL 97* 98* 99*  CO2 19* 20* 20*  GLUCOSE 133* 96 117*  BUN 21* 17 10  CREATININE 3.01* 2.66* 1.92*  CALCIUM 6.9* 6.8* 7.2*  PHOS 3.3 2.6 2.3*      Last Labs      Recent Labs Lab 03/19/2015 0819  04/04/15 1325 04/05/15 0405 04/06/15 0505  WBC 18.5* < > 32.5* 27.0* 28.3*  NEUTROABS 15.9* --  --  --  --   HGB 14.3 < > 14.2 15.2* 15.3*  HCT 43.2 < > 43.6 44.1 44.4  MCV 84.2 < > 86.2 81.4 80.7  PLT 137* < > 278 190 209  < > = values in this interval not displayed.    Scheduled Meds: . amiodarone 100 mg Per Tube Daily  . antiseptic oral rinse 7 mL Mouth Rinse QID  . aspirin EC 81 mg Oral Daily  . atorvastatin 10 mg Per Tube q1800  . chlorhexidine 15 mL Mouth Rinse BID  . collagenase  Topical Daily  . insulin aspart 0-9 Units Subcutaneous 6 times per day  . levETIRAcetam 500 mg Intravenous Q12H  .  midodrine 10 mg Oral TID WC  . multivitamin 1 tablet Oral QHS  . pantoprazole sodium 40 mg Per Tube Q24H  . piperacillin-tazobactam (ZOSYN) IV 2.25 g Intravenous 4 times per day  . sodium chloride 3 mL Intravenous Q12H  . vancomycin 750 mg Intravenous Q24H   Continuous Infusions: . sodium chloride 50 mL/hr at 04/05/15 1715  . heparin 10,000 units/ 20 mL infusion syringe   . norepinephrine (LEVOPHED) Adult infusion 5 mcg/min (04/06/15 0600)  . phenylephrine (NEO-SYNEPHRINE) Adult infusion 140 mcg/min (04/06/15 0630)  . dialysis replacement fluid (prismasate) 1,500 mL/hr at 04/06/15 0606  . dialysis replacement fluid (prismasate) 300 mL/hr at 04/05/15 1627  . dialysate (PRISMASATE) 1,000 mL/hr at 04/06/15 0214   PRN Meds:.acetaminophen (TYLENOL) oral liquid 160 mg/5 mL, fentaNYL (SUBLIMAZE) injection, heparin, heparin, hydrOXYzine, LORazepam, midazolam, [DISCONTINUED] ondansetron **OR** ondansetron (ZOFRAN) IV, sodium chloride  ABG  Labs (Brief)       Component Value Date/Time   PHART 7.480* 04/05/2015 0305   PCO2ART 26.9* 04/05/2015 0305   PO2ART 174* 04/05/2015 0305   HCO3 19.8* 04/05/2015 0305   TCO2 20.6 04/05/2015 0305   ACIDBASEDEF 3.2* 04/05/2015 0305   O2SAT 98.8 04/05/2015 0305     Dialysis Orders: Center: [facility] on MWF . EDW 81 kg HD Bath 3.0 K 2.5 Ca Time 4 hours Heparin 8000 units. Access  RFA AVF BFR 400 DFR 800  Calcitriol .25 mcg PO q MWF Venofer 50 mg IV q week with HD  A/P 1. ESRD 1. Started CRRT post code / hypotension 7/22 2. Cont CRRT no hep 3. INR >3,  4. Cont with net even UF 5. HCO3 slowly corrected 6. Serum Na should slowly correct also 2. S/p VTach/Torsades 04/04/15 1. Per CCM 2. Improved pressors 3. Leukocytosis, hypotension / Sepsis, ? Decubitus Wound Infection with leukocytosis and low grade fevers  1. On Vanc/Zosyn,  2. Wound GS no bacteria  seen 3. BCx and Wound Cx NGTD 4. Per CCM 4. Vol/BP and hypotension on pressors 1. Keeping even currenlty 2. Up 8-9kg over dry wt 5. Anemia: stable not on ESA 6. MBD: cotn outpt meds 7. AFib 1. AC per CCM 2. INR therapeutic 3. As above  Kelly Splinter MD (pgr) [phone]    [phone] 04/07/2015, 9:16 AM

## 2015-04-07 NOTE — Clinical Social Work Placement (Signed)
   CLINICAL SOCIAL WORK PLACEMENT  NOTE  Date:  04/07/2015  Patient Details  Name: [name] MRN: [id number] Date of Birth: May 11, 1956  Clinical Social Work is seeking post-discharge placement for this patient at the [place] level of care (*CSW will initial, date and re-position this form in  chart as items are completed):  Yes   Patient/family provided with [facility] Work Department's list of facilities offering this level of care within the geographic area requested by the patient (or if unable, by the patient's family).  Yes   Patient/family informed of their freedom to choose among providers that offer the needed level of care, that participate in Medicare, Medicaid or managed care program needed by the patient, have an available bed and are willing to accept the patient.  Yes   Patient/family informed of 's ownership interest in [facility] and [facility], as well as of the fact that they are under no obligation to receive care at these facilities.  PASRR submitted to EDS on       PASRR number received on       Existing PASRR number confirmed on       FL2 transmitted to all facilities in geographic area requested by pt/family on       FL2 transmitted to all facilities within larger geographic area on       Patient informed that his/her managed care company has contracts with or will negotiate with certain facilities, including the following:            Patient/family informed of bed offers received.  Patient chooses bed at       Physician recommends and patient chooses bed at      Patient to be transferred to   on  .  Patient to be transferred to facility by       Patient family notified on   of transfer.  Name of family member notified:        PHYSICIAN Please sign FL2     Additional Comment:    _______________________________________________ Glendon Axe, MSW, [place] [phone] 04/07/2015 3:26  PM

## 2015-04-07 NOTE — Progress Notes (Signed)
Nutrition Follow-up  DOCUMENTATION CODES:   Morbid obesity  INTERVENTION:    Continue TF via OGT with Vital High Protein, increase goal rate to 45 ml/h (1080 ml per day) to provide 1080 kcals, 95 gm protein, 903 ml free water daily.  NUTRITION DIAGNOSIS:   Increased nutrient needs related to wound healing as evidenced by estimated needs.  Ongoing  GOAL:   Provide needs based on ASPEN/SCCM guidelines  Met  MONITOR:   Vent status, Labs, Weight trends, Skin  REASON FOR ASSESSMENT:   Consult Enteral/tube feeding initiation and management  ASSESSMENT:   The patient was sent to the emergency room from dialysis due to palpitations and racing heart rate.Admitted on 7/18 with infected pressure ulcers. Hx of right BKA and left AKA, ESRD-on HD, DM-2, and recent right middle finger amputation.  Labs reviewed: sodium, chloride, phosphorus low.  Discussed patient in ICU rounds and with RN today. Continues to require CRRT. Received MD Consult for TF initiation and management.  Patient is currently intubated on ventilator support MV: 12.7 L/min Temp (24hrs), Avg:97.6 F (36.4 C), Min:97.4 F (36.3 C), Max:97.8 F (36.6 C)   Diet Order:  Diet NPO time specified  Skin:  Wound (see comment) (unstageable pressure ulcers to left and right buttocks)  Last BM:  7/25  Height:   Ht Readings from Last 1 Encounters:  03/25/2015 _0  (1.422 m)    Weight:   Wt Readings from Last 1 Encounters:  04/07/15 198 lb 6.6 oz (90 kg)   04/07/2015 183 lb 3.2 oz (83.1 kg)        Ideal Body Weight:  36.3 kg   BMI:  Body mass index is 44.51 kg/(m^2).  Estimated Nutritional Needs:   Kcal:  [phone]  Protein:  85-100 gm  Fluid:  >/= 1.6 L  EDUCATION NEEDS:   Education needs no appropriate at this time   Molli Barrows, [place], [place], [place] Pager [phone] After Hours Pager [phone]

## 2015-04-07 NOTE — Consult Note (Signed)
WOC requested to conference with bedside nurse today, patient with worsening MASD (moisture associated skin damage) and it has been a futile effort to try to treat the pressure injury that this patient was admitted with.  Discussed use of just barrier cream and possible placement of a Flexiseal to contain her stool a litter better.  Bedside nurse will obtain order for flexiseal from CCM.  Will DC use of foam and enzymatic debridement ointment for now and request my partner follow up on the status of her wounds at the end of the week to determine if we need to restart the enzyme.  [place] RN,CWOCN] [phone]

## 2015-04-08 LAB — COMPREHENSIVE METABOLIC PANEL
ALT: 57 U/L — AB (ref 14–54)
ANION GAP: 7 (ref 5–15)
AST: 36 U/L (ref 15–41)
Albumin: 1.2 g/dL — ABNORMAL LOW (ref 3.5–5.0)
Alkaline Phosphatase: 91 U/L (ref 38–126)
BUN: <5 mg/dL — ABNORMAL LOW (ref 6–20)
CO2: 22 mmol/L (ref 22–32)
Calcium: 7.3 mg/dL — ABNORMAL LOW (ref 8.9–10.3)
Chloride: 102 mmol/L (ref 101–111)
Creatinine, Ser: 1.07 mg/dL — ABNORMAL HIGH (ref 0.44–1.00)
GFR calc non Af Amer: 55 mL/min — ABNORMAL LOW (ref >60)
GLUCOSE: 99 mg/dL (ref 65–99)
POTASSIUM: 3.5 mmol/L (ref 3.5–5.1)
SODIUM: 131 mmol/L — AB (ref 135–145)
Total Bilirubin: 1.6 mg/dL — ABNORMAL HIGH (ref 0.3–1.2)
Total Protein: 5.4 g/dL — ABNORMAL LOW (ref 6.5–8.1)

## 2015-04-08 LAB — GLUCOSE, CAPILLARY
GLUCOSE-CAPILLARY: 103 mg/dL — AB (ref 65–99)
GLUCOSE-CAPILLARY: 142 mg/dL — AB (ref 65–99)
GLUCOSE-CAPILLARY: 129 mg/dL — AB (ref 65–99)
GLUCOSE-CAPILLARY: 139 mg/dL — AB (ref 65–99)
Glucose-Capillary: 110 mg/dL — ABNORMAL HIGH (ref 65–99)
Glucose-Capillary: 138 mg/dL — ABNORMAL HIGH (ref 65–99)
Glucose-Capillary: 136 mg/dL — ABNORMAL HIGH (ref 65–99)

## 2015-04-08 LAB — CBC
HEMATOCRIT: 41.9 % (ref 36.0–46.0)
HEMOGLOBIN: 14 g/dL (ref 12.0–15.0)
MCH: 27.8 pg (ref 26.0–34.0)
MCHC: 33.4 g/dL (ref 30.0–36.0)
MCV: 83.1 fL (ref 78.0–100.0)
Platelets: 146 10*3/uL — ABNORMAL LOW (ref 150–400)
RBC: 5.04 MIL/uL (ref 3.87–5.11)
RDW: 17.7 % — ABNORMAL HIGH (ref 11.5–15.5)
WBC: 20.4 10*3/uL — AB (ref 4.0–10.5)

## 2015-04-08 LAB — MAGNESIUM
MAGNESIUM: 2 mg/dL (ref 1.7–2.4)
Magnesium: 2.1 mg/dL (ref 1.7–2.4)

## 2015-04-08 LAB — RENAL FUNCTION PANEL
ALBUMIN: 1.1 g/dL — AB (ref 3.5–5.0)
Anion gap: 9 (ref 5–15)
BUN: <5 mg/dL — ABNORMAL LOW (ref 6–20)
CALCIUM: 7.3 mg/dL — AB (ref 8.9–10.3)
CO2: 21 mmol/L — ABNORMAL LOW (ref 22–32)
CREATININE: 1.08 mg/dL — AB (ref 0.44–1.00)
Chloride: 102 mmol/L (ref 101–111)
GFR calc Af Amer: >60 mL/min (ref >60)
GFR, EST NON AFRICAN AMERICAN: 55 mL/min — AB (ref >60)
GLUCOSE: 168 mg/dL — AB (ref 65–99)
PHOSPHORUS: 2.5 mg/dL (ref 2.5–4.6)
Potassium: 3.8 mmol/L (ref 3.5–5.1)
Sodium: 132 mmol/L — ABNORMAL LOW (ref 135–145)

## 2015-04-08 LAB — PROTIME-INR
INR: 3.43 — AB (ref 0.00–1.49)
PROTHROMBIN TIME: 33.9 s — AB (ref 11.6–15.2)

## 2015-04-08 LAB — APTT: APTT: 55 s — AB (ref 24–37)

## 2015-04-08 LAB — PHOSPHORUS: Phosphorus: 1.4 mg/dL — ABNORMAL LOW (ref 2.5–4.6)

## 2015-04-08 MED ORDER — SODIUM PHOSPHATE 3 MMOLE/ML IV SOLN
15.0000 mmol | Freq: Once | INTRAVENOUS | Status: AC
  Administered 2015-04-08: 15 mmol via INTRAVENOUS
  Filled 2015-04-08: qty 5

## 2015-04-08 NOTE — Progress Notes (Signed)
PULMONARY / CRITICAL CARE MEDICINE   Name: [name] MRN: [id number] DOB: November 28, 1955    ADMISSION DATE:  04/03/2015 CONSULTATION DATE:  04/01/15  REFERRING MD :  Eliseo Squires  CHIEF COMPLAINT:  Hypotension, Sepsis secondary to Decubitus Ulcers  INITIAL PRESENTATION:  59 y.o. F with ESRD (MWF) admitted to [facility] 7/18 with leukocytosis and elevated lactate. On 7/19, had persistent hypotension despite fluid boluses; therefore, PCCM called for ICU transfer.  STUDIES:  7/18 CXR >> No acute cardiopulmonary disease 7/22 CXR >>ETT and NG tube in place, opacity of left lung apex consolidation vs airspace collapse 7/22 KUB >> NG tube and a line in place 7/23 CXR >>LUL opacity could be pneumonia 7/23 EEG >> no seizure activity 7/25 CXR >> improved edema, possible pneumonia vs pulmonary edema in left apex  SIGNIFICANT EVENTS: 7/18 - admit. 7/19 - transfer to ICU for hypotension not responding to IVF boluses. Given 5mg  vit K and 2u FFP for INR 8.24. 7/22 - VT/PEA cardiac arrest, VDRF, seizure  Lines/Drains: HD Catheter Right Femoral 7/22 NG 7/22 ETT 7/22 Arterial Line 7/22  SUBJECTIVE / Interval events:  No acute events overnight. Awake follows commands  VITAL SIGNS: Temp:  [93 F (33.9 C)-97.9 F (36.6 C)] 97 F (36.1 C) (07/26 0435) Pulse Rate:  [25-119] 67 (07/26 0645) Resp:  [13-33] 18 (07/26 0700) BP: (71-107)/(37-74) 80/52 mmHg (07/26 0600) SpO2:  [70 %-100 %] 70 % (07/25 2200) Arterial Line BP: (80-109)/(39-59) 88/52 mmHg (07/26 0700) FiO2 (%):  [40 %] 40 % (07/26 0600) Weight:  [198 lb 10.2 oz (90.1 kg)] 198 lb 10.2 oz (90.1 kg) (07/26 0400) HEMODYNAMICS:   VENTILATOR SETTINGS: Vent Mode:  [-] PRVC FiO2 (%):  [40 %] 40 % Set Rate:  [20 bmp] 20 bmp Vt Set:  [500 mL] 500 mL PEEP:  [5 cmH20] 5 cmH20 Plateau Pressure:  [20 cmH20-22 cmH20] 20 cmH20 INTAKE / OUTPUT:  Intake/Output Summary (Last 24 hours) at 04/08/15 0726 Last data filed at 04/08/15 0700  Gross per 24 hour   Intake 3058.51 ml  Output   3197 ml  Net -138.49 ml   PHYSICAL EXAMINATION: General: ill appearing Neuro: rass 0, follows commands HEENT: ETT in place Cardiovascular: s1 s2 irregular Lungs: scattered crackles Abdomen:  Soft, no organomegaly Musculoskeletal:  Left AKA, right BKA, right middle finger amputated Skin:  Unstageable decubitus sacral ulcers bilateral x2 (one on right and one on left)  LABS:  CBC  Recent Labs Lab 04/06/15 0505 04/07/15 0415 04/08/15 0410  WBC 28.3* 24.0* 20.4*  HGB 15.3* 14.5 14.0  HCT 44.4 43.0 41.9  PLT 209 175 146*   Coag's  Recent Labs Lab 04/06/15 0505 04/07/15 0415 04/08/15 0410  APTT 45* 53* 55*  INR 3.08* 3.22* 3.43*   BMET  Recent Labs Lab 04/07/15 0415 04/07/15 1531 04/08/15 0410  NA 129* 131* 131*  K 3.5 3.6 3.5  CL 100* 103 102  CO2 22 22 22   BUN 7 7 <5*  CREATININE 1.34* 1.16* 1.07*  GLUCOSE 143* 128* 99   Electrolytes  Recent Labs Lab 04/07/15 0415 04/07/15 1229 04/07/15 1531 04/08/15 0001 04/08/15 0410  CALCIUM 7.2*  --  7.3*  --  7.3*  MG 2.0 2.1  --  2.0 2.1  PHOS 1.8* 1.7* 1.8* 1.4*  --    Sepsis Markers  Recent Labs Lab 04/04/15 1327 04/04/15 1800 04/05/15 0406  LATICACIDVEN 8.85* 4.3* 2.3*   ABG  Recent Labs Lab 04/04/15 1458 04/04/15 1818 04/05/15 0305  PHART  7.179* 7.367 7.480*  PCO2ART 34.4* 30.6* 26.9*  PO2ART 87.0 157.0* 174*     Liver Enzymes  Recent Labs Lab 04/05/15 0405  04/06/15 0505  04/07/15 0415 04/07/15 1531 04/08/15 0410  AST 164*  --  112*  --   --   --  36  ALT 86*  --  93*  --   --   --  57*  ALKPHOS 74  --  87  --   --   --  91  BILITOT 1.3*  --  1.4*  --   --   --  1.6*  ALBUMIN 1.2*  < > 1.2*  < > 1.2* 1.2* 1.2*  < > = values in this interval not displayed. Cardiac Enzymes  Recent Labs Lab 04/05/15 0405 04/05/15 0806 04/05/15 2209  TROPONINI 1.29* 1.23* 1.05*   Glucose  Recent Labs Lab 04/07/15 0428 04/07/15 0754 04/07/15 1223  04/07/15 2037 04/08/15 0015 04/08/15 0418  GLUCAP 130* 140* 138* 92 110* 103*   Imaging Dg Chest Port 1 View  04/07/2015   CLINICAL DATA:  Acute respiratory failure  EXAM: PORTABLE CHEST - 1 VIEW  COMPARISON:  Portable chest x-ray of April 05, 2015  FINDINGS: The right lung is clear. The interstitial markings have improved. On the left there is persistent apical density. The retrocardiac region remains dense. The left hemidiaphragm is better demonstrated. A small left pleural effusion persists. The external pacemaker -defibrillator pad has been removed. The cardiac silhouette remains enlarged. The pulmonary vascularity is not clearly engorged today. The endotracheal tube tip lies 3.9 cm above the carina. The esophagogastric tube tip projects below the inferior margin of the image. There are 6 intact sternal wires.  IMPRESSION: Improved appearance of both lungs consistent with resolving interstitial edema. Persistent confluent opacity in the left apex which may reflect pneumonia. Stable small left pleural effusion. The support tubes are in reasonable position.   Electronically Signed   By: David  Martinique M.D.   On: 04/07/2015 07:30   ASSESSMENT / PLAN:  CARDIOVASCULAR A:  VT/PEA arrest 7/22 >> likely from metabolic derangements. A fib with RVR on coumadin as outpt, currently on hold Septic shock. Chronic hypotension. Chronic systolic CHF >> baseline EF 35 to 40%. Hx of CAD.  P:  Continue amiodarone per tube Holding home coumadin. Heparin gtt to start when INR < 2.0. Continue ASA, atorvastatin Started midodrine 7/23 Weaning Levophed, dc this Neo, re add vaso if needed, map goal 50 with good MS Repeat cortisol 23, wnl Repeat echo  PULMONARY ETT 7/22>>> A: Acute respiratory failure in setting of cardiac arrest. Hx of OSA. HCAP   P:  Full vent support Assess for PS/ cpap, SBT today, failed, repeat with higher SP in future pcxr is better Last abg noted, keep same mV Even goals,  pcxr c/w edema but improved  RENAL A:  ESRD (1.34) - pre-hospitalization dialysis schedule MWF. Hyponatremia (129)  P:  CRRT initiated 7/22>> Goal I=O with CRRT Nephrology following [facility] to [facility]  GASTROINTESTINAL A: r/o shock liver Nutrition.  P:  Tube Feeds Protonix for SUP  HEMATOLOGIC A:  Leukocytosis (20.4) INR 3.43  P:  F/u CBC Monitor INR Holding Heparin until INR <2  INFECTIOUS A:  Septic shock - most likely secondary to Sacral decub ulcer. HCAP  P:  BCx2 7/18 >>No Growth Wound Cx 7/18 >> Multiple organs present, none predominant  Abx: Vanc, start date 7/18>>>7/25 Abx: Zosyn, start date 7/18>>> (End Date 7/27 to complete 10 day course)  Wound care team following  Follow up CT right stump per renal  ENDOCRINE A:  DM type II. Hypoglycemic. R/o burn out adrnal P:  Moderate SSI Monitor CBG  NEUROLOGIC A:  Acute encephalopathy initially from sepsis. 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 Hx Neuropathy, TIA.  P:  RASS goal 0 Continue keppra Ativan PRN Versed, Fentanyl PRN  Dr. Junie Panning, DO Family Medicine, PGY-2  04/08/2015, 7:26 AM   STAFF NOTE: I, Merrie Roof, MD FACP have personally reviewed patient's available data, including medical history, events of note, physical examination and test results as part of my evaluation. I have discussed with resident/NP and other care providers such as pharmacist, RN and RRT. In addition, I personally evaluated patient and elicited key findings of: awakens, follows commands, thi sam with wean PS had rapid declines, concern rapid acidosis leading to shock, then MS change that recovered after back on vent, in future PS to 12 to start, for CT stump per renal, zosyn for decub, repeat echo assess pa pressure escalation, CVVHD on going, cortisol is adequate, upright as able  The patient is critically ill with  multiple organ systems failure and requires high complexity decision making for assessment and support, frequent evaluation and titration of therapies, application of advanced monitoring technologies and extensive interpretation of multiple databases.   Critical Care Time devoted to patient care services described in this note is 30 Minutes. This time reflects time of care of this signee: Merrie Roof, MD FACP. This critical care time does not reflect procedure time, or teaching time or supervisory time of PA/NP/Med student/Med Resident etc but could involve care discussion time. Rest per NP/medical resident whose note is outlined above and that I agree with   [name]. Titus Mould, MD, [place] Pgr: [place] [facility] 04/08/2015 11:30 AM

## 2015-04-08 NOTE — Progress Notes (Signed)
Called the on call Renal MD about a CT of the right leg that was ordered with am by the Dr Jonnie Finner. IV unable to be placed by IV team on multiple attempts. Also unable to use power port due to 2 pressors currently infusing. On call Renal MD stated "it would be be re-evaluated in the am."  CT called and notified of the change.

## 2015-04-08 NOTE — Progress Notes (Signed)
[facility] Progress Note   Subjective: not coming off pressors.+draining from R stump onto bandage. R middle finger tender some per staff, less swollen than it was before.   Filed Vitals:   04/08/15 0830 04/08/15 0845 04/08/15 0900 04/08/15 0915  BP:   79/47   Pulse:   90 35  Temp:      TempSrc:      Resp: 34 32 25 26  Height:      Weight:      SpO2:   65% 62%   Exam: On vent, sedated No jvd Chest clear bilat RRR no RG Abd scars, some induration abd pannus, +bs Breast large area SQ irreg mass on R breast, smaller area SQ mass L side Sacral area several areas of superficial skin breakdown, no signs of infection Uterine prolapse is present R BK stump dark, boggy, slight drainage on bandage L AK stump clean, no drainage Neuro is sedated   MWF [facility]  81kg   4h   3/2.5 bath  Hep 8000  RFA AVF Calcitriol 0.25 ug VEnofer 50/wk      Assessment: 1. Septic shock - unclear source, decubiti not deep enough to cause sepsis. May have infected R stump, will get CT, is draining and tender. Pressor-dependent.  2. Chronic hypotension on midodrine 3. ESRD on CRRT w temp cath for now 4. S/P cardiac arrest (VT/ torsade) 5. Vol excess - due to sepsis/ fluids, is 8-9kg up 6. Anemia Hb 14 no esa 7. Afib on anticoagulation 8. Hypophos - will ask pharm to replace  Plan - cont CRRT, keep even, midodrine. CT R stump, I/O cath for UA.     Kelly Splinter MD  pager [phone]    cell [phone]  04/08/2015, 9:38 AM     Recent Labs Lab 04/07/15 0415 04/07/15 1229 04/07/15 1531 04/08/15 0001 04/08/15 0410  NA 129*  --  131*  --  131*  K 3.5  --  3.6  --  3.5  CL 100*  --  103  --  102  CO2 22  --  22  --  22  GLUCOSE 143*  --  128*  --  99  BUN 7  --  7  --  <5*  CREATININE 1.34*  --  1.16*  --  1.07*  CALCIUM 7.2*  --  7.3*  --  7.3*  PHOS 1.8* 1.7* 1.8* 1.4*  --     Recent Labs Lab 04/05/15 0405  04/06/15 0505  04/07/15 0415 04/07/15 1531 04/08/15 0410  AST  164*  --  112*  --   --   --  36  ALT 86*  --  93*  --   --   --  57*  ALKPHOS 74  --  87  --   --   --  91  BILITOT 1.3*  --  1.4*  --   --   --  1.6*  PROT 5.0*  --  5.6*  --   --   --  5.4*  ALBUMIN 1.2*  < > 1.2*  < > 1.2* 1.2* 1.2*  < > = values in this interval not displayed.  Recent Labs Lab 04/06/15 0505 04/07/15 0415 04/08/15 0410  WBC 28.3* 24.0* 20.4*  HGB 15.3* 14.5 14.0  HCT 44.4 43.0 41.9  MCV 80.7 82.5 83.1  PLT 209 175 146*   . amiodarone  100 mg Per Tube Daily  . antiseptic oral rinse  7 mL Mouth Rinse QID  .  aspirin  81 mg Per Tube Daily  . atorvastatin  10 mg Per Tube q1800  . chlorhexidine  15 mL Mouth Rinse BID  . collagenase   Topical Daily  . feeding supplement (VITAL HIGH PROTEIN)  1,000 mL Per Tube Q24H  . insulin aspart  0-9 Units Subcutaneous 6 times per day  . levETIRAcetam  500 mg Per Tube BID  . midodrine  10 mg Oral TID WC  . multivitamin  1 tablet Oral QHS  . pantoprazole sodium  40 mg Per Tube Q24H  . piperacillin-tazobactam (ZOSYN)  IV  2.25 g Intravenous 4 times per day  . sodium chloride  3 mL Intravenous Q12H   . sodium chloride 10 mL/hr at 04/08/15 0506  . norepinephrine (LEVOPHED) Adult infusion 3 mcg/min (04/07/15 1400)  . phenylephrine (NEO-SYNEPHRINE) Adult infusion 160 mcg/min (04/08/15 0900)  . dialysis replacement fluid (prismasate) 1,500 mL/hr at 04/08/15 0833  . dialysis replacement fluid (prismasate) 300 mL/hr at 04/07/15 1954  . dialysate (PRISMASATE) 1,000 mL/hr at 04/08/15 0555  . vasopressin (PITRESSIN) infusion - *FOR SHOCK* 0.03 Units/min (04/08/15 0900)   acetaminophen (TYLENOL) oral liquid 160 mg/5 mL, fentaNYL (SUBLIMAZE) injection, heparin, hydrOXYzine, LORazepam, midazolam, [DISCONTINUED] ondansetron **OR** ondansetron (ZOFRAN) IV, sodium chloride

## 2015-04-08 NOTE — Progress Notes (Signed)
In and out cath per DR Schertz's orders no return urine or drainage

## 2015-04-08 NOTE — Clinical Social Work Note (Signed)
FL-2 completed and to be placed on chart for MD signature. CSW will continue to follow patient and pt's family for continued support and to facilitate patient's discharge needs once medically stable.   CSW remains available as needed.   Glendon Axe, MSW, LCSWA [phone] 04/08/2015 11:23 AM

## 2015-04-09 ENCOUNTER — Inpatient Hospital Stay (HOSPITAL_COMMUNITY): Payer: Medicare Other

## 2015-04-09 DIAGNOSIS — Z9911 Dependence on respirator [ventilator] status: Secondary | ICD-10-CM

## 2015-04-09 DIAGNOSIS — I2510 Atherosclerotic heart disease of native coronary artery without angina pectoris: Secondary | ICD-10-CM

## 2015-04-09 DIAGNOSIS — I509 Heart failure, unspecified: Secondary | ICD-10-CM

## 2015-04-09 DIAGNOSIS — I2781 Cor pulmonale (chronic): Secondary | ICD-10-CM

## 2015-04-09 LAB — RENAL FUNCTION PANEL
ALBUMIN: 1.1 g/dL — AB (ref 3.5–5.0)
ANION GAP: 7 (ref 5–15)
ANION GAP: 9 (ref 5–15)
Albumin: 1.1 g/dL — ABNORMAL LOW (ref 3.5–5.0)
BUN: 6 mg/dL (ref 6–20)
BUN: 8 mg/dL (ref 6–20)
CHLORIDE: 103 mmol/L (ref 101–111)
CO2: 20 mmol/L — AB (ref 22–32)
CO2: 21 mmol/L — AB (ref 22–32)
CREATININE: 1 mg/dL (ref 0.44–1.00)
Calcium: 7.1 mg/dL — ABNORMAL LOW (ref 8.9–10.3)
Calcium: 7.3 mg/dL — ABNORMAL LOW (ref 8.9–10.3)
Chloride: 101 mmol/L (ref 101–111)
Creatinine, Ser: 0.95 mg/dL (ref 0.44–1.00)
GFR calc Af Amer: >60 mL/min (ref >60)
GFR calc non Af Amer: 60 mL/min — ABNORMAL LOW (ref >60)
GFR calc non Af Amer: >60 mL/min (ref >60)
GLUCOSE: 195 mg/dL — AB (ref 65–99)
Glucose, Bld: 175 mg/dL — ABNORMAL HIGH (ref 65–99)
POTASSIUM: 3.7 mmol/L (ref 3.5–5.1)
Phosphorus: 2.1 mg/dL — ABNORMAL LOW (ref 2.5–4.6)
Phosphorus: 1.7 mg/dL — ABNORMAL LOW (ref 2.5–4.6)
Potassium: 3.8 mmol/L (ref 3.5–5.1)
Sodium: 130 mmol/L — ABNORMAL LOW (ref 135–145)
Sodium: 131 mmol/L — ABNORMAL LOW (ref 135–145)

## 2015-04-09 LAB — GLUCOSE, CAPILLARY
GLUCOSE-CAPILLARY: 140 mg/dL — AB (ref 65–99)
GLUCOSE-CAPILLARY: 161 mg/dL — AB (ref 65–99)
GLUCOSE-CAPILLARY: 155 mg/dL — AB (ref 65–99)
GLUCOSE-CAPILLARY: 162 mg/dL — AB (ref 65–99)
Glucose-Capillary: 145 mg/dL — ABNORMAL HIGH (ref 65–99)
Glucose-Capillary: 156 mg/dL — ABNORMAL HIGH (ref 65–99)

## 2015-04-09 LAB — CBC
HCT: 41.8 % (ref 36.0–46.0)
Hemoglobin: 14.2 g/dL (ref 12.0–15.0)
MCH: 28.2 pg (ref 26.0–34.0)
MCHC: 34 g/dL (ref 30.0–36.0)
MCV: 82.9 fL (ref 78.0–100.0)
Platelets: 148 10*3/uL — ABNORMAL LOW (ref 150–400)
RBC: 5.04 MIL/uL (ref 3.87–5.11)
RDW: 18.3 % — AB (ref 11.5–15.5)
WBC: 25.3 10*3/uL — ABNORMAL HIGH (ref 4.0–10.5)

## 2015-04-09 LAB — MAGNESIUM
Magnesium: 2 mg/dL (ref 1.7–2.4)
Magnesium: 2 mg/dL (ref 1.7–2.4)

## 2015-04-09 LAB — PROTIME-INR
INR: 3.77 — ABNORMAL HIGH (ref 0.00–1.49)
Prothrombin Time: 36.4 seconds — ABNORMAL HIGH (ref 11.6–15.2)

## 2015-04-09 LAB — TROPONIN I: TROPONIN I: 0.52 ng/mL — AB (ref <0.031)

## 2015-04-09 LAB — APTT: APTT: 56 s — AB (ref 24–37)

## 2015-04-09 LAB — PHOSPHORUS: Phosphorus: 1.7 mg/dL — ABNORMAL LOW (ref 2.5–4.6)

## 2015-04-09 LAB — CLOSTRIDIUM DIFFICILE BY PCR: Toxigenic C. Difficile by PCR: NEGATIVE

## 2015-04-09 MED ORDER — FENTANYL CITRATE (PF) 100 MCG/2ML IJ SOLN
100.0000 ug | INTRAMUSCULAR | Status: DC | PRN
  Administered 2015-04-09 – 2015-04-11 (×8): 100 ug via INTRAVENOUS
  Filled 2015-04-09 (×8): qty 2

## 2015-04-09 MED ORDER — AMIODARONE IV BOLUS ONLY 150 MG/100ML
150.0000 mg | Freq: Once | INTRAVENOUS | Status: AC
  Administered 2015-04-09: 150 mg via INTRAVENOUS
  Filled 2015-04-09: qty 100

## 2015-04-09 MED ORDER — GERHARDT'S BUTT CREAM
TOPICAL_CREAM | CUTANEOUS | Status: DC | PRN
  Administered 2015-04-09 (×2): via TOPICAL
  Administered 2015-04-10: 1 via TOPICAL
  Administered 2015-04-10: 06:00:00 via TOPICAL
  Filled 2015-04-09: qty 1

## 2015-04-09 NOTE — Progress Notes (Signed)
VASCULAR LAB PRELIMINARY  PRELIMINARY  PRELIMINARY  PRELIMINARY  Bilateral lower extremity venous duplex  completed.    Preliminary report:  Bilateral:  No evidence of DVT, superficial thrombosis, or Baker's Cyst.   Khush Pasion, RVT 04/09/2015, 4:06 PM

## 2015-04-09 NOTE — Progress Notes (Signed)
ANTICOAGULATION & ANTIBIOTIC CONSULT NOTE - Follow Up Consult  Pharmacy Consult for Vancomycin + Zosyn & Heparin (when INR<2) Indication: Empiric coverage of sacral wound & atrial fibrillation   Allergies  Allergen Reactions  . Neurontin [Gabapentin] Anaphylaxis    Tolerates Lyrica without issue  . Ezetimibe-Simvastatin Itching and Swelling     facial edema  . Klonopin [Clonazepam] Other (See Comments)    hallucination  . Sertraline Hcl Other (See Comments)    hallucination    Patient Measurements: Height: 4\' 8"  (142.2 cm) Weight: 198 lb 13.7 oz (90.2 kg) IBW/kg (Calculated) : 36.3 Heparin Dosing Weight: 58 kg  Vital Signs: Temp: 97.5 F (36.4 C) (07/27 0758) Temp Source: Oral (07/27 0758) BP: 90/61 mmHg (07/27 0800) Pulse Rate: 108 (07/27 0740)  Labs:  Recent Labs  04/07/15 0415  04/08/15 0410 04/08/15 1643 04/09/15 0347  HGB 14.5  --  14.0  --  14.2  HCT 43.0  --  41.9  --  41.8  PLT 175  --  146*  --  148*  APTT 53*  --  55*  --  56*  LABPROT 32.3*  --  33.9*  --  36.4*  INR 3.22*  --  3.43*  --  3.77*  CREATININE 1.34*  < > 1.07* 1.08* 1.00  < > = values in this interval not displayed.  Estimated Creatinine Clearance: 54.7 mL/min (by C-G formula based on Cr of 1).   Assessment: 61 YOF with ESRD on warfarin PTA for hx Afib - held on admission due to an elevated INR of 6.97 on PTA dose of 2 mg daily EXCEPT for 4 mg on MWF. The INR was reversed on 7/19 with 2 units FFP and Vit K 5 mg. Warfarin was resumed on 7/21. The patient received 1 dose with an INR rise up to 1.65 << 1.36. On 7/22 AM, the patient was noted to be in Afib with RVR and later that day the patient coded. Pharmacy has now been consulted to transition to Heparin for anticoagulation with Afib when INR <2. INR this morning remains SUPRAtherapeutic (INR 3.77<<3.08 << 2.81, goal of 2-3). Will continue to hold heparin at this time. Will recheck with AM labs. Ruling out alternate causes of prolonged PTT.    Today is the final day of Zosyn therapy (D#7) for empiric sacral wound coverage. The patient was being dosed intermittently with HD however on 7/22, the patient was transitioned to CRRT.   Vanc 7/18 >>7/25 * 7/22 VR: 10 mcg/ml (pre-start of CRRT) >> given 1250 mg x 1 and started on 750 mg/24h Zosyn 7/18 >7/28   7/18 Sacral wound > Multiple organisms present, none predominant. No staph aureus, no GAS isolated 7/18 BCx > NG x3d 7/18 MRSA PCR - NEG  Goal of Therapy:  Appropriate anticoagulation Proper antibiotics for infection/cultures adjusted for renal/hepatic function    Plan:  1. Continue to hold Heparin - will plan to start when INR<2 2. Continue Zosyn 2.25g IV every 6 hours    Albertina Parr, PharmD., BCPS Clinical Pharmacist Pager [phone]

## 2015-04-09 NOTE — Progress Notes (Signed)
[facility] Progress Note   Subjective: seen by cardiology, has severe RV dysfunction/ dilated RA and LVEF 35-40%, no good Rx options.   Filed Vitals:   04/09/15 1115 04/09/15 1121 04/09/15 1123 04/09/15 1134  BP:  85/65    Pulse:      Temp:    98 F (36.7 C)  TempSrc:    Oral  Resp: 19  19   Height:      Weight:      SpO2:       Exam: On vent, sedated No jvd Chest clear bilat RRR no RG Abd scars, some induration abd pannus, +bs Breast large area SQ irreg mass on R breast, smaller area SQ mass L side Sacral area several areas of superficial skin breakdown, no signs of infection Uterine prolapse is present R BK stump dark, boggy, slight drainage on bandage L AK stump clean, no drainage Neuro is sedated   MWF [facility]  81kg   4h   3/2.5 bath  Hep 8000  RFA AVF Calcitriol 0.25 ug VEnofer 50/wk      Assessment: 1. Shock - severe RV failure/ pulm HTN and/or sepsis. Pressor-dependent, not improving. Seen by cardiology, no good Rx options.  May need palliative care.  2. Chronic hypotension on midodrine 3. ESRD on CRRT w temp cath for now 4. S/P cardiac arrest (VT/ torsade) 5. Vol excess - due to sepsis/ fluids, is 8-9kg up and CXR looks wet today first time 6. Anemia Hb 14 no esa 7. Afib on anticoagulation 8. Hypophos - pharm is replacing  Plan - cont CRRT, try UF 50-75 cc/hr.  Very poor prognosis.    Kelly Splinter MD  pager [phone]    cell [phone]  04/09/2015, 11:46 AM     Recent Labs Lab 04/08/15 0001 04/08/15 0410 04/08/15 1643 04/09/15 0347  NA  --  131* 132* 130*  K  --  3.5 3.8 3.8  CL  --  102 102 103  CO2  --  22 21* 20*  GLUCOSE  --  99 168* 175*  BUN  --  <5* <5* 6  CREATININE  --  1.07* 1.08* 1.00  CALCIUM  --  7.3* 7.3* 7.1*  PHOS 1.4*  --  2.5 2.1*    Recent Labs Lab 04/05/15 0405  04/06/15 0505  04/08/15 0410 04/08/15 1643 04/09/15 0347  AST 164*  --  112*  --  36  --   --   ALT 86*  --  93*  --  57*  --   --    ALKPHOS 74  --  87  --  91  --   --   BILITOT 1.3*  --  1.4*  --  1.6*  --   --   PROT 5.0*  --  5.6*  --  5.4*  --   --   ALBUMIN 1.2*  < > 1.2*  < > 1.2* 1.1* 1.1*  < > = values in this interval not displayed.  Recent Labs Lab 04/07/15 0415 04/08/15 0410 04/09/15 0347  WBC 24.0* 20.4* 25.3*  HGB 14.5 14.0 14.2  HCT 43.0 41.9 41.8  MCV 82.5 83.1 82.9  PLT 175 146* 148*   . amiodarone  100 mg Per Tube Daily  . antiseptic oral rinse  7 mL Mouth Rinse QID  . aspirin  81 mg Per Tube Daily  . atorvastatin  10 mg Per Tube q1800  . chlorhexidine  15 mL Mouth Rinse BID  . feeding  supplement (VITAL HIGH PROTEIN)  1,000 mL Per Tube Q24H  . insulin aspart  0-9 Units Subcutaneous 6 times per day  . levETIRAcetam  500 mg Per Tube BID  . midodrine  10 mg Oral TID WC  . multivitamin  1 tablet Oral QHS  . pantoprazole sodium  40 mg Per Tube Q24H  . piperacillin-tazobactam (ZOSYN)  IV  2.25 g Intravenous 4 times per day  . sodium chloride  3 mL Intravenous Q12H   . sodium chloride 10 mL/hr (04/09/15 0748)  . norepinephrine (LEVOPHED) Adult infusion 3 mcg/min (04/07/15 1400)  . phenylephrine (NEO-SYNEPHRINE) Adult infusion 60 mcg/min (04/09/15 1100)  . dialysis replacement fluid (prismasate) 1,500 mL/hr at 04/09/15 1001  . dialysis replacement fluid (prismasate) 300 mL/hr at 04/09/15 0644  . dialysate (PRISMASATE) 1,000 mL/hr at 04/09/15 0747  . vasopressin (PITRESSIN) infusion - *FOR SHOCK* 0.03 Units/min (04/09/15 1100)   acetaminophen (TYLENOL) oral liquid 160 mg/5 mL, fentaNYL (SUBLIMAZE) injection, heparin, hydrOXYzine, LORazepam, midazolam, [DISCONTINUED] ondansetron **OR** ondansetron (ZOFRAN) IV, sodium chloride

## 2015-04-09 NOTE — Progress Notes (Signed)
PROGRESS NOTE  Subjective:   Patient is a 59 y.o. female with a PMHx of ESRD,  CAD s/p CABG, SLE, DM, PVD - s/p s/p right BKA, left AKA, , atrial fib , who was admitted to [facility] on 03/17/2015 for evaluation of sacral decubitus ulcers and hypotension . She had VT / VF on 7/22 and was intubated.  The PCCM team has had difficulty weaning her - she becomes very hypotensive with weaning attempts.  We have been asked to see her again and consider left heart cath .     Objective:    Vital Signs:   Temp:  [97.5 F (36.4 C)-98.7 F (37.1 C)] 97.5 F (36.4 C) (07/27 0758) Pulse Rate:  [25-121] 108 (07/27 0740) Resp:  [15-28] 19 (07/27 1002) BP: (67-108)/(33-83) 86/52 mmHg (07/27 1000) SpO2:  [47 %-100 %] 100 % (07/27 0740) Arterial Line BP: (77-114)/(36-63) 102/53 mmHg (07/27 1002) FiO2 (%):  [40 %] 40 % (07/27 0740) Weight:  [90.2 kg (198 lb 13.7 oz)] 90.2 kg (198 lb 13.7 oz) (07/27 0433)  Last BM Date:  (flexiseal)   24-hour weight change: Weight change: 0.1 kg (3.5 oz)  Weight trends: Filed Weights   04/07/15 0400 04/08/15 0400 04/09/15 0433  Weight: 90 kg (198 lb 6.6 oz) 90.1 kg (198 lb 10.2 oz) 90.2 kg (198 lb 13.7 oz)    Intake/Output:  07/26 0701 - 07/27 0700 In: 3380.9 [I.V.:1792.9; NG/GT:1180; IV Piggyback:408] Out: 3113  Total I/O In: 367.7 [I.V.:132.7; NG/GT:185; IV Piggyback:50] Out: 349 [Other:349]   Physical Exam: BP 86/52 mmHg  Pulse 108  Temp(Src) 97.5 F (36.4 C) (Oral)  Resp 19  Ht 4\' 8"  (1.422 m)  Wt 90.2 kg (198 lb 13.7 oz)  BMI 44.61 kg/m2  SpO2 100%  Wt Readings from Last 3 Encounters:  04/09/15 90.2 kg (198 lb 13.7 oz)  03/05/15 82.101 kg (181 lb)  01/30/15 83.915 kg (185 lb)    General: Vital signs reviewed and noted. Awake, on the vent   Head: Normocephalic, atraumatic.  Eyes: conjunctivae/corneas clear.  EOM's intact.   Throat: normal  Neck:  thick   Lungs:    on vent   Heart:  Irreg. Irreg.    Abdomen:  Soft, non-tender,  non-distended    Extremities: S/p right BKA, s/p left AKA   Neurologic: awake  Psych: Difficult to tell - on the vent     Labs: BMET:  Recent Labs  04/08/15 0410 04/08/15 1643 04/09/15 0347  NA 131* 132* 130*  K 3.5 3.8 3.8  CL 102 102 103  CO2 22 21* 20*  GLUCOSE 99 168* 175*  BUN <5* <5* 6  CREATININE 1.07* 1.08* 1.00  CALCIUM 7.3* 7.3* 7.1*  MG 2.1  --  2.0  PHOS  --  2.5 2.1*    Liver function tests:  Recent Labs  04/08/15 0410 04/08/15 1643 04/09/15 0347  AST 36  --   --   ALT 57*  --   --   ALKPHOS 91  --   --   BILITOT 1.6*  --   --   PROT 5.4*  --   --   ALBUMIN 1.2* 1.1* 1.1*   No results for input(s): LIPASE, AMYLASE in the last 72 hours.  CBC:  Recent Labs  04/08/15 0410 04/09/15 0347  WBC 20.4* 25.3*  HGB 14.0 14.2  HCT 41.9 41.8  MCV 83.1 82.9  PLT 146* 148*    Cardiac Enzymes: No results for input(s):  CKTOTAL, CKMB, TROPONINI in the last 72 hours.  Coagulation Studies:  Recent Labs  04/07/15 0415 04/08/15 0410 04/09/15 0347  LABPROT 32.3* 33.9* 36.4*  INR 3.22* 3.43* 3.77*    Other:  No results for input(s): VITAMINB12, FOLATE, FERRITIN, TIBC, IRON, RETICCTPCT in the last 72 hours.   Other results:  EKG  ( personally reviewed ) tele  Atrial fib with RVR   Medications:    Infusions: . sodium chloride 10 mL/hr (04/09/15 0748)  . norepinephrine (LEVOPHED) Adult infusion 3 mcg/min (04/07/15 1400)  . phenylephrine (NEO-SYNEPHRINE) Adult infusion 60 mcg/min (04/09/15 1000)  . dialysis replacement fluid (prismasate) 1,500 mL/hr at 04/09/15 1001  . dialysis replacement fluid (prismasate) 300 mL/hr at 04/09/15 0644  . dialysate (PRISMASATE) 1,000 mL/hr at 04/09/15 0747  . vasopressin (PITRESSIN) infusion - *FOR SHOCK* 0.03 Units/min (04/09/15 1000)    Scheduled Medications: . amiodarone  100 mg Per Tube Daily  . antiseptic oral rinse  7 mL Mouth Rinse QID  . aspirin  81 mg Per Tube Daily  . atorvastatin  10 mg Per Tube  q1800  . chlorhexidine  15 mL Mouth Rinse BID  . feeding supplement (VITAL HIGH PROTEIN)  1,000 mL Per Tube Q24H  . insulin aspart  0-9 Units Subcutaneous 6 times per day  . levETIRAcetam  500 mg Per Tube BID  . midodrine  10 mg Oral TID WC  . multivitamin  1 tablet Oral QHS  . pantoprazole sodium  40 mg Per Tube Q24H  . piperacillin-tazobactam (ZOSYN)  IV  2.25 g Intravenous 4 times per day  . sodium chloride  3 mL Intravenous Q12H    Assessment/ Plan:   Principal Problem:   Wound infection Active Problems:   Unilateral AKA--left   Leucocytosis   Hx of right BKA   Atrial fibrillation with RVR   Elevated INR (international normalized ratio) due to prior anticoagulant medication ingestion   Pressure ulcer   Acute respiratory failure with hypoxia   Cardiac arrest   Encounter for orogastric (OG) tube placement   Acute respiratory failure  1.   Pulmonary HTN:  Likely secondary to SLE and other causes.  I think that we are unlikely to be able to reverse this P-HTN  This unfortunate patient has known hx of CAD / CABG.  She is a bilateral amputee, ESRD, marked hypotension on 2 pressors ( vasopressin and phenyephrine)  has decubitus ulcers with a current WBC of 25K, .  She has cor pulmonale with an RV that is severely dilated and severely hypocontractile, massively dilated RA. I have reviewed the case and the echo with members of the interventional team and all agree that the chances of finding a coronary stenosis that can be fixed is low and the risk of interventional procedures is fairly high.  Her main issue is that of RV failure .  She may benefit from  a PA catheter . I have reviewed the case with Dr. Haroldine Laws who agrees.    2. Chronic systolic ( and likely diastolic ) CHF:   She has CKD and has had CABG.  She is a poor candidate for invasive procedures    2. Hypotension:  Still requiring vasopressin and phenylephrine. BP is marginal at best  Prelim review of the echo show  moderate   LV dysfunction with EF ~ 35-40 . It shows marked RV and RA enlargement with moderate - severe pulmonary HTN.  4. Decubitus ulcers:  WBC is now up to 25K .  This infection is not yet adequately controlled.   I think we need to consider palliative care in this unfortunate patient.    Disposition:  Length of Stay: 9  Thayer Headings, Brooke Bonito., MD, [facility] 04/09/2015, 10:28 AM Office [phone] Pager [phone]

## 2015-04-09 NOTE — Progress Notes (Signed)
eLink Physician-Brief Progress Note Patient Name: [name] DOB: 12-01-1955 MRN: [id number]   Date of Service  04/09/2015  HPI/Events of Note  Patient with diarrhea and skin breakdown. Wound Care Consult recommends sending C. Difficile Toxin.  eICU Interventions  Will order C. Difficile Toxin.     Intervention Category Intermediate Interventions: Other:  Lysle Dingwall 04/09/2015, 4:36 PM

## 2015-04-09 NOTE — Progress Notes (Signed)
PULMONARY / CRITICAL CARE MEDICINE   Name: [name] MRN: [id number] DOB: 03-19-1956    ADMISSION DATE:  03/18/2015 CONSULTATION DATE:  04/01/15  REFERRING MD :  Eliseo Squires  CHIEF COMPLAINT:  Hypotension, Sepsis secondary to Decubitus Ulcers  INITIAL PRESENTATION:  59 y.o. F with ESRD (MWF) admitted to [facility] 7/18 with leukocytosis and elevated lactate. On 7/19, had persistent hypotension despite fluid boluses; therefore, PCCM called for ICU transfer.  STUDIES:  7/18 CXR >> No acute cardiopulmonary disease 7/22 CXR >>ETT and NG tube in place, opacity of left lung apex consolidation vs airspace collapse 7/22 KUB >> NG tube and a line in place 7/23 CXR >>LUL opacity could be pneumonia 7/23 EEG >> no seizure activity 7/25 CXR >> improved edema, possible pneumonia vs pulmonary edema in left apex  SIGNIFICANT EVENTS: 7/18 - admit. 7/19 - transfer to ICU for hypotension not responding to IVF boluses. Given 5mg  vit K and 2u FFP for INR 8.24. 7/22 - VT/PEA cardiac arrest, VDRF, seizure  Lines/Drains: HD Catheter Right Femoral 7/22 NG 7/22 ETT 7/22 Arterial Line 7/22  SUBJECTIVE / Interval events:  No acute events overnight.  VITAL SIGNS: Temp:  [97.9 F (36.6 C)-99.5 F (37.5 C)] 97.9 F (36.6 C) (07/27 0417) Pulse Rate:  [25-121] 121 (07/27 0329) Resp:  [15-42] 23 (07/27 0600) BP: (73-118)/(47-83) 89/64 mmHg (07/27 0600) SpO2:  [51 %-98 %] 98 % (07/27 0400) Arterial Line BP: (70-114)/(36-63) 91/45 mmHg (07/27 0600) FiO2 (%):  [40 %] 40 % (07/27 0400) Weight:  [198 lb 13.7 oz (90.2 kg)] 198 lb 13.7 oz (90.2 kg) (07/27 0433) HEMODYNAMICS:   VENTILATOR SETTINGS: Vent Mode:  [-] PRVC FiO2 (%):  [40 %] 40 % Set Rate:  [20 bmp] 20 bmp Vt Set:  [500 mL] 500 mL PEEP:  [5 cmH20] 5 cmH20 Plateau Pressure:  [19 cmH20-22 cmH20] 22 cmH20 INTAKE / OUTPUT:  Intake/Output Summary (Last 24 hours) at 04/09/15 0629 Last data filed at 04/09/15 0600  Gross per 24 hour  Intake 3389.61  ml  Output   3208 ml  Net 181.61 ml   PHYSICAL EXAMINATION: General: ill appearing Neuro: rass 0, follows commands HEENT: ETT in place Cardiovascular: s1 s2 irregular Lungs: scattered crackles Abdomen:  Soft, no organomegaly Musculoskeletal:  Left AKA, right BKA, right middle finger amputated Skin:  Unstageable decubitus sacral ulcers bilateral x2 (one on right and one on left)  LABS:  CBC  Recent Labs Lab 04/07/15 0415 04/08/15 0410 04/09/15 0347  WBC 24.0* 20.4* 25.3*  HGB 14.5 14.0 14.2  HCT 43.0 41.9 41.8  PLT 175 146* 148*   Coag's  Recent Labs Lab 04/07/15 0415 04/08/15 0410 04/09/15 0347  APTT 53* 55* 56*  INR 3.22* 3.43* 3.77*   BMET  Recent Labs Lab 04/08/15 0410 04/08/15 1643 04/09/15 0347  NA 131* 132* 130*  K 3.5 3.8 3.8  CL 102 102 103  CO2 22 21* 20*  BUN <5* <5* 6  CREATININE 1.07* 1.08* 1.00  GLUCOSE 99 168* 175*   Electrolytes  Recent Labs Lab 04/08/15 0001 04/08/15 0410 04/08/15 1643 04/09/15 0347  CALCIUM  --  7.3* 7.3* 7.1*  MG 2.0 2.1  --  2.0  PHOS 1.4*  --  2.5 2.1*   Sepsis Markers  Recent Labs Lab 04/04/15 1327 04/04/15 1800 04/05/15 0406  LATICACIDVEN 8.85* 4.3* 2.3*   ABG  Recent Labs Lab 04/04/15 1458 04/04/15 1818 04/05/15 0305  PHART 7.179* 7.367 7.480*  PCO2ART 34.4* 30.6* 26.9*  PO2ART  87.0 157.0* 174*     Liver Enzymes  Recent Labs Lab 04/05/15 0405  04/06/15 0505  04/08/15 0410 04/08/15 1643 04/09/15 0347  AST 164*  --  112*  --  36  --   --   ALT 86*  --  93*  --  57*  --   --   ALKPHOS 74  --  87  --  91  --   --   BILITOT 1.3*  --  1.4*  --  1.6*  --   --   ALBUMIN 1.2*  < > 1.2*  < > 1.2* 1.1* 1.1*  < > = values in this interval not displayed. Cardiac Enzymes  Recent Labs Lab 04/05/15 0405 04/05/15 0806 04/05/15 2209  TROPONINI 1.29* 1.23* 1.05*   Glucose  Recent Labs Lab 04/08/15 0751 04/08/15 1218 04/08/15 1527 04/08/15 1946 04/09/15 0051 04/09/15 0416   GLUCAP 142* 129* 136* 139* 145* 156*   Imaging No results found. ASSESSMENT / PLAN:  CARDIOVASCULAR A:  VT/PEA arrest 7/22 >> likely from metabolic derangements. A fib with RVR on coumadin as outpt, currently on hold Septic shock. Chronic hypotension. Chronic systolic CHF >> baseline EF 35 to 40%. Hx of CAD.  P:  Continue amiodarone per tube Holding home coumadin. Heparin gtt to start when INR < 2.0. Continue ASA, atorvastatin Midodrine Neo, Vasopressin, MAP goal 50 with good mental status Repeat echo awaited  Concern cardiac ischemia- see comments below in pulm and attd commets  PULMONARY ETT 7/22>>> A: Acute respiratory failure in setting of cardiac arrest. Hx of OSA. HCAP   P:  PS attempts cpap 5/, ps to 18 required She has immediate failure with hypotension - ischemia? consider cath, cards re evaluation Echo, trop repeat Assess doppler uppers, consider CT angio with such failure on weaning but she had INR high with code; but lupus anticoagulant in differential which cause ptt elevation   RENAL A:  ESRD (1) - pre-hospitalization dialysis schedule MWF. Hyponatremia (130)  P:  CRRT initiated 7/22>> Goal I=O with CRRT Nephrology following [facility] to [facility]  GASTROINTESTINAL A: r/o shock liver Nutrition.  P:  Tube Feeds Protonix for SUP Repeat LF in am  HEMATOLOGIC A:  Leukocytosis (25.3) INR 3.77 Unclear etiology continued coagulapathy At risk factor inhibitor with h/o lupus, at risk lupus anticoagulant  P:  F/u CBC Monitor INR Holding Heparin until INR <2 Repeat LFT May need mixing study Assess lupus activity - CH50, DS DNA, c4, c4, ana Order lupus anticoagulant  INFECTIOUS A:  Septic shock - most likely secondary to Sacral decub ulcer. HCAP  P:  BCx2 7/18 >>No Growth Wound Cx 7/18 >> Multiple organs present, none predominant  Abx: Vanc, start date 7/18>>>7/25 Abx: Zosyn, start date 7/18>>> (End Date 7/27 to complete  10 day course) Wound care team following  Follow up CT right stump per renal- unable, no access  ENDOCRINE A:  DM type II. Hypoglycemic. 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 Hx Neuropathy, TIA.  P:  RASS goal 0 Continue keppra Ativan PRN Versed, Fentanyl PRN  Dr. Junie Panning, DO Family Medicine, PGY-2  04/09/2015, 6:29 AM  STAFF NOTE: I, Merrie Roof, MD FACP have personally reviewed patient's available data, including medical history, events of note, physical examination and test results as part of my evaluation. I have discussed with resident/NP and other  care providers such as pharmacist, RN and RRT. In addition, I personally evaluated patient and elicited key findings of: lungs anterior coarse, follows commands, she fails weaning with Hypotension, concern ischemia, call back cards, assess trop, cath? S/p cabg, doppler arms, assess mixing study, assess lupus activity, repeat lft in am , await echo, so important The patient is critically ill with multiple organ systems failure and requires high complexity decision making for assessment and support, frequent evaluation and titration of therapies, application of advanced monitoring technologies and extensive interpretation of multiple databases.   Critical Care Time devoted to patient care services described in this note is30 Minutes. This time reflects time of care of this signee: Merrie Roof, MD FACP. This critical care time does not reflect procedure time, or teaching time or supervisory time of PA/NP/Med student/Med Resident etc but could involve care discussion time. Rest per NP/medical resident whose note is outlined above and that I agree with   Lavon Paganini. Titus Mould, MD, [place] Pgr: [facility] 04/09/2015  9:01 AM

## 2015-04-09 NOTE — Progress Notes (Signed)
  Echocardiogram 2D Echocardiogram has been performed.  Lysle Rubens 04/09/2015, 9:45 AM

## 2015-04-09 NOTE — Progress Notes (Signed)
Attempted to perform spontaneous breathing trial twice this morning.  Both times, patient had a decrease in MAP of at least 20.  Patient back on full support currently.  Will continue to monitor.

## 2015-04-09 NOTE — Progress Notes (Signed)
eLink Physician-Brief Progress Note Patient Name: [name] DOB: 1956-04-05 MRN: [id number]   Date of Service  04/09/2015  HPI/Events of Note  AFIB with RVR. Rate = 160's. Already on Amiodarone PO. BMP pending.   eICU Interventions  Will order: 1. Amiodarone 150 mg IV bolus now.      Intervention Category Major Interventions: Arrhythmia - evaluation and management  Barba Solt [place] 04/09/2015, 6:25 PM

## 2015-04-09 NOTE — Consult Note (Addendum)
WOC wound follow up Wound type: Refer to initial consult on 7/20 for 2 unstageable pressure injuries.  Pt has been having incontinent loose frequent stools this week and entire bilat buttocks have evolved into moisture associated skin damage. (MASD)  Red, macerated, moist skin with affected area approx 20X20 cm.  Flexiseal was inserted earlier this week to attempt to contain stool but it leaks some around the tube and dressings are remaining soiled.  Inner perineum is red and macerated also.  Pt has become critically ill since initial consult; CVVHD, post-code, multiple pressors, intubated on vent.  It will be difficult to promote healing R/T multiple systemic factors.    Measurement: Unstageable pressure ulcers remain present as previously noted, now surrounded by MASD. Santyl d/ced at this time since dressings are trapping stool against skin. Area to remain open to air.   Dressing procedure/placement/frequency: Pt is on an air mattress to decrease pressure. Flexiseal is containing most of the stool.  Consider sending C-diff stool specimen. Gerhardts butt cream to repel moisture and decrease discomfort to macerated areas.  No family present to discuss plan of care and pt is not responsive at this time. Please re-consult if further assistance is needed.  Thank-you,  Julien Girt MSN, [place], [place], [place], [place]

## 2015-04-10 LAB — RENAL FUNCTION PANEL
ALBUMIN: 1.2 g/dL — AB (ref 3.5–5.0)
Albumin: 1.2 g/dL — ABNORMAL LOW (ref 3.5–5.0)
Anion gap: 7 (ref 5–15)
Anion gap: 9 (ref 5–15)
BUN: 8 mg/dL (ref 6–20)
BUN: 7 mg/dL (ref 6–20)
CALCIUM: 7.4 mg/dL — AB (ref 8.9–10.3)
CHLORIDE: 101 mmol/L (ref 101–111)
CO2: 21 mmol/L — AB (ref 22–32)
CO2: 22 mmol/L (ref 22–32)
CREATININE: 0.86 mg/dL (ref 0.44–1.00)
Calcium: 7.6 mg/dL — ABNORMAL LOW (ref 8.9–10.3)
Chloride: 103 mmol/L (ref 101–111)
Creatinine, Ser: 0.8 mg/dL (ref 0.44–1.00)
GFR calc Af Amer: >60 mL/min (ref >60)
GFR calc non Af Amer: >60 mL/min (ref >60)
GLUCOSE: 157 mg/dL — AB (ref 65–99)
Glucose, Bld: 170 mg/dL — ABNORMAL HIGH (ref 65–99)
POTASSIUM: 4.1 mmol/L (ref 3.5–5.1)
Phosphorus: 1.7 mg/dL — ABNORMAL LOW (ref 2.5–4.6)
Phosphorus: 3 mg/dL (ref 2.5–4.6)
Potassium: 3.9 mmol/L (ref 3.5–5.1)
Sodium: 131 mmol/L — ABNORMAL LOW (ref 135–145)
Sodium: 132 mmol/L — ABNORMAL LOW (ref 135–145)

## 2015-04-10 LAB — HEPATIC FUNCTION PANEL
ALT: 42 U/L (ref 14–54)
AST: 26 U/L (ref 15–41)
Albumin: 1.1 g/dL — ABNORMAL LOW (ref 3.5–5.0)
Alkaline Phosphatase: 120 U/L (ref 38–126)
BILIRUBIN INDIRECT: 1 mg/dL — AB (ref 0.3–0.9)
BILIRUBIN TOTAL: 1.7 mg/dL — AB (ref 0.3–1.2)
Bilirubin, Direct: 0.7 mg/dL — ABNORMAL HIGH (ref 0.1–0.5)
TOTAL PROTEIN: 5.5 g/dL — AB (ref 6.5–8.1)

## 2015-04-10 LAB — GLUCOSE, CAPILLARY
GLUCOSE-CAPILLARY: 197 mg/dL — AB (ref 65–99)
GLUCOSE-CAPILLARY: 157 mg/dL — AB (ref 65–99)
Glucose-Capillary: 149 mg/dL — ABNORMAL HIGH (ref 65–99)
Glucose-Capillary: 159 mg/dL — ABNORMAL HIGH (ref 65–99)
Glucose-Capillary: 149 mg/dL — ABNORMAL HIGH (ref 65–99)
Glucose-Capillary: 94 mg/dL (ref 65–99)

## 2015-04-10 LAB — PT FACTOR INHIBITOR (MIXING STUDY)
1 HR INCUB PT 1:1NP: 16.7 s — ABNORMAL HIGH (ref 11.9–14.1)
PT 11NP: 16 s — AB (ref 11.9–14.1)
PT: 41.9 s — ABNORMAL HIGH (ref 11.9–14.1)

## 2015-04-10 LAB — APTT: aPTT: 45 seconds — ABNORMAL HIGH (ref 24–37)

## 2015-04-10 LAB — MAGNESIUM: Magnesium: 2.1 mg/dL (ref 1.7–2.4)

## 2015-04-10 LAB — PTT FACTOR INHIBITOR (MIXING STUDY)
aPTT 1:1 Mix Saline: 48.8 s — ABNORMAL HIGH (ref 0.0–37.0)
aPTT 1:1 NP Incub. Mix Ctl: 37.9 s — ABNORMAL HIGH (ref 23.4–36.4)
aPTT 1:1 NP Mix, 60 Min,Incub.: 38.1 s — ABNORMAL HIGH (ref 23.4–36.4)
aPTT 1:1 Normal Plasma: 35.5 s (ref 23.4–36.4)
aPTT: 50.9 s — ABNORMAL HIGH (ref 23.4–36.4)

## 2015-04-10 LAB — PROTIME-INR
INR: 2.94 — ABNORMAL HIGH (ref 0.00–1.49)
Prothrombin Time: 30.2 seconds — ABNORMAL HIGH (ref 11.6–15.2)

## 2015-04-10 LAB — COMPLEMENT, TOTAL: COMPL TOTAL (CH50): 42 U/mL (ref 42–60)

## 2015-04-10 LAB — C3 COMPLEMENT: C3 Complement: 75 mg/dL — ABNORMAL LOW (ref 82–167)

## 2015-04-10 LAB — C4 COMPLEMENT: Complement C4, Body Fluid: 27 mg/dL (ref 14–44)

## 2015-04-10 LAB — PHOSPHORUS: PHOSPHORUS: 1.7 mg/dL — AB (ref 2.5–4.6)

## 2015-04-10 MED ORDER — WHITE PETROLATUM GEL: Status: DC | PRN

## 2015-04-10 MED ORDER — POTASSIUM PHOSPHATES 15 MMOLE/5ML IV SOLN
20.0000 meq | Freq: Once | INTRAVENOUS | Status: AC
  Administered 2015-04-10: 20 meq via INTRAVENOUS
  Filled 2015-04-10: qty 4.55

## 2015-04-10 MED ORDER — NOREPINEPHRINE BITARTRATE 1 MG/ML IV SOLN
0.0000 ug/min | INTRAVENOUS | Status: DC
  Administered 2015-04-10: 20 ug/min via INTRAVENOUS
  Filled 2015-04-10: qty 4

## 2015-04-10 MED ORDER — WHITE PETROLATUM GEL
Status: AC
  Administered 2015-04-10: 0.2
  Filled 2015-04-10: qty 1

## 2015-04-10 NOTE — Progress Notes (Signed)
Family refused the placement of the NG tube tonight. Would rather have SLP look at the patient tomorrow. MD notified

## 2015-04-10 NOTE — Procedures (Signed)
Extubation Procedure Note  Patient Details:   Name: [name] DOB: 10-25-1955 MRN: [id number]   Airway Documentation:     Evaluation  O2 sats: stable throughout Complications: No apparent complications Patient did tolerate procedure well. Bilateral Breath Sounds: Clear Suctioning: Airway Yes   Patient extubated to 2L nasal cannula per MD order.  Positive cuff leak noted.  No evidence of stridor.  Vitals are stable.  Patient able to speak post extubation.  No apparent complications.    Philomena Doheny 04/10/2015, 1:47 PM

## 2015-04-10 NOTE — Progress Notes (Signed)
eLink Physician-Brief Progress Note Patient Name: [name] DOB: 02/10/1956 MRN: [id number]   Date of Service  04/10/2015  HPI/Events of Note  Hypotension with BP = 40/23 on Phenylephrine and agonal breathing. Patient is a DNR/DNI.   eICU Interventions  Will order Norepinephrine IV infusion. The bedside nurse is contacting the family so that we might move to comfort measures.      Intervention Category Major Interventions: Hypotension - evaluation and management  Lisha Vitale [place] 04/10/2015, 10:26 PM

## 2015-04-10 NOTE — Progress Notes (Addendum)
At about 2230 pt became profoundly hypotensive. Neo drip increased to max. Pt became unresponsive and agonal/irregular breathing noted. Pt placed on NRB. Pt was made a DNR today. Melissa Noon MD. New order for Levo started.  Pts BP responded well to the Levo, but pt still remains unresponsive to physical and verbal stimulation.  Pts brother contacted and made aware of the pts change. Family is on their way to the hospital. Will continue to monitor closely.

## 2015-04-10 NOTE — Progress Notes (Signed)
Pts brother and sister in law are at the bedside. Family members given update about earlier event tonight. Pt is now responding to verbal stimulation but is lethargic.  Discussed with family members about pts current status and they agree to continue on with current treatments.  Support given to family. Will continue to monitor closely.  Warren Lacy MD called and notified.

## 2015-04-10 NOTE — Progress Notes (Signed)
ANTICOAGULATION CONSULT NOTE - Follow Up Consult  Pharmacy Consult for Heparin (when INR<2) Indication: Atrial fibrillation   Allergies  Allergen Reactions  . Neurontin [Gabapentin] Anaphylaxis    Tolerates Lyrica without issue  . Ezetimibe-Simvastatin Itching and Swelling     facial edema  . Klonopin [Clonazepam] Other (See Comments)    hallucination  . Sertraline Hcl Other (See Comments)    hallucination    Patient Measurements: Height: 4\' 8"  (142.2 cm) Weight: 196 lb 8 oz (89.132 kg) IBW/kg (Calculated) : 36.3 Heparin Dosing Weight: 58 kg  Vital Signs: Temp: 97.4 F (36.3 C) (07/28 0815) Temp Source: Axillary (07/28 0815) BP: 79/41 mmHg (07/28 0805) Pulse Rate: 102 (07/28 0805)  Labs:  Recent Labs  04/08/15 0410  04/09/15 0347 04/09/15 1030 04/09/15 1600 04/10/15 0420  HGB 14.0  --  14.2  --   --   --   HCT 41.9  --  41.8  --   --   --   PLT 146*  --  148*  --   --   --   APTT 55*  --  56*  --   --  45*  LABPROT 33.9*  --  36.4*  --   --  30.2*  INR 3.43*  --  3.77*  --   --  2.94*  CREATININE 1.07*  < > 1.00  --  0.95 0.80  TROPONINI  --   --   --  0.52*  --   --   < > = values in this interval not displayed.  Estimated Creatinine Clearance: 67.8 mL/min (by C-G formula based on Cr of 0.8).   Assessment: 70 YOF with ESRD on warfarin PTA for hx Afib - held on admission due to an elevated INR of 6.97 on PTA dose of 2 mg daily EXCEPT for 4 mg on MWF. The INR was reversed on 7/19 with 2 units FFP and Vit K 5 mg. Warfarin was resumed on 7/21. The patient received 1 dose with an INR rise up to 1.65 << 1.36. On 7/22 AM, the patient was noted to be in Afib with RVR and later that day the patient coded. Pharmacy has now been consulted to transition to Heparin for anticoagulation with Afib when INR <2. INR this morning remains SUPRAtherapeutic (INR 2.94<<3.77<<3.08 << 2.81, goal of 2-3). Will continue to hold heparin at this time. Will recheck with AM labs. Ruling out  alternate causes of prolonged PTT.   Goal of Therapy:  Heparin level 0.3-0.7 IU/mL   Plan:  1. Continue to hold Heparin - will plan to start when Dina Rich, PharmD., BCPS Clinical Pharmacist Pager [phone]

## 2015-04-10 NOTE — Progress Notes (Signed)
[facility] Progress Note   Subjective: remains on pressors, not much better.    Filed Vitals:   04/10/15 1300 04/10/15 1400 04/10/15 1500 04/10/15 1600  BP:      Pulse:  70    Temp:      TempSrc:      Resp: 27 16 19 17   Height:      Weight:      SpO2:  0%     Exam: On vent, sedated No jvd Chest clear bilat RRR no RG Abd scars, some induration abd pannus, +bs Breast large area SQ irreg mass on R breast, smaller area SQ mass L side Sacral area several areas of superficial skin breakdown, no signs of infection Uterine prolapse is present R BK stump dark, boggy, slight drainage on bandage L AK stump clean, no drainage Neuro is sedated   MWF [facility]  81kg   4h   3/2.5 bath  Hep 8000  RFA AVF Calcitriol 0.25 ug VEnofer 50/wk      Assessment: 1. Shock - severe RV failure/ pulm HTN and/or sepsis. Pressor-dependent, not improving.  2. Chronic hypotension on midodrine 3. ESRD on CRRT w temp cath for now 4. S/P cardiac arrest (VT/ torsade) 5. Vol excess - due to sepsis/ fluids, is 8-9kg up and CXR looks wet today first time 6. Anemia Hb 14 no esa 7. Afib on anticoagulation 8. Hypophos - pharm is replacing 9. DNAR - pt is now DNR  Plan - cont CRRT for now, pull negative 50-75 cc/hr    Kelly Splinter MD  pager [phone]    cell [phone]  04/10/2015, 4:49 PM     Recent Labs Lab 04/09/15 0347 04/09/15 1600 04/09/15 1608 04/10/15 0420  NA 130* 131*  --  131*  K 3.8 3.7  --  3.9  CL 103 101  --  103  CO2 20* 21*  --  21*  GLUCOSE 175* 195*  --  170*  BUN 6 8  --  8  CREATININE 1.00 0.95  --  0.80  CALCIUM 7.1* 7.3*  --  7.4*  PHOS 2.1* 1.7* 1.7* 1.7*  1.7*    Recent Labs Lab 04/06/15 0505  04/08/15 0410  04/09/15 0347 04/09/15 1600 04/10/15 0420  AST 112*  --  36  --   --   --  26  ALT 93*  --  57*  --   --   --  42  ALKPHOS 87  --  91  --   --   --  120  BILITOT 1.4*  --  1.6*  --   --   --  1.7*  PROT 5.6*  --  5.4*  --   --   --  5.5*   ALBUMIN 1.2*  < > 1.2*  < > 1.1* 1.1* 1.1*  1.2*  < > = values in this interval not displayed.  Recent Labs Lab 04/07/15 0415 04/08/15 0410 04/09/15 0347  WBC 24.0* 20.4* 25.3*  HGB 14.5 14.0 14.2  HCT 43.0 41.9 41.8  MCV 82.5 83.1 82.9  PLT 175 146* 148*   . amiodarone  100 mg Per Tube Daily  . antiseptic oral rinse  7 mL Mouth Rinse QID  . aspirin  81 mg Per Tube Daily  . atorvastatin  10 mg Per Tube q1800  . chlorhexidine  15 mL Mouth Rinse BID  . insulin aspart  0-9 Units Subcutaneous 6 times per day  . levETIRAcetam  500 mg Per Tube  BID  . midodrine  10 mg Oral TID WC  . multivitamin  1 tablet Oral QHS  . pantoprazole sodium  40 mg Per Tube Q24H  . sodium chloride  3 mL Intravenous Q12H   . sodium chloride 10 mL/hr (04/09/15 0748)  . norepinephrine (LEVOPHED) Adult infusion 3 mcg/min (04/07/15 1400)  . phenylephrine (NEO-SYNEPHRINE) Adult infusion 80 mcg/min (04/10/15 1400)  . dialysis replacement fluid (prismasate) 1,500 mL/hr at 04/10/15 1017  . dialysis replacement fluid (prismasate) 300 mL/hr at 04/09/15 1943  . dialysate (PRISMASATE) 1,000 mL/hr at 04/10/15 0900  . vasopressin (PITRESSIN) infusion - *FOR SHOCK* 0.03 Units/min (04/10/15 0620)   acetaminophen (TYLENOL) oral liquid 160 mg/5 mL, fentaNYL (SUBLIMAZE) injection, Gerhardt's butt cream, heparin, hydrOXYzine, LORazepam, [DISCONTINUED] ondansetron **OR** ondansetron (ZOFRAN) IV, sodium chloride, white petrolatum

## 2015-04-10 NOTE — Progress Notes (Signed)
Patient ID: [name], female   DOB: 01/17/1956, 59 y.o.   MRN: [id number]  I have had extensive discussions with patient. We discussed patients current circumstances and organ failures. We also discussed patient's prior wishes under circumstances such as this and confirmed by sister in law. Family and pt  has decided to NOT perform resuscitation if arrest but to continue current medical support for now. Comfort if fails extubation.  No swan , no no. No cardiac interventions   Lavon Paganini. Titus Mould, MD, [place] Pgr: [place] [facility]

## 2015-04-10 NOTE — Progress Notes (Signed)
PULMONARY / CRITICAL CARE MEDICINE   Name: [name] MRN: [id number] DOB: January 11, 1956    ADMISSION DATE:  03/27/2015 CONSULTATION DATE:  04/01/15  REFERRING MD :  Eliseo Squires  CHIEF COMPLAINT:  Hypotension, Sepsis secondary to Decubitus Ulcers  INITIAL PRESENTATION:  59 y.o. F with ESRD (MWF) admitted to [facility] 7/18 with leukocytosis and elevated lactate. On 7/19, had persistent hypotension despite fluid boluses; therefore, PCCM called for ICU transfer.  STUDIES:  7/18 CXR >> No acute cardiopulmonary disease 7/22 CXR >>ETT and NG tube in place, opacity of left lung apex consolidation vs airspace collapse 7/22 KUB >> NG tube and a line in place 7/23 CXR >>LUL opacity could be pneumonia 7/23 EEG >> no seizure activity 7/25 CXR >> improved edema, possible pneumonia vs pulmonary edema in left apex 7/27 CXR >> improved 7/27 Echo >> EF 30-35%, moderately to severely reduced LV function, diffuse LV hypokinesis, PA pressure 66, left pleural effusion 7/27 LE Doppler >> no evidence of DVT  SIGNIFICANT EVENTS: 7/18 - admit. 7/19 - transfer to ICU for hypotension not responding to IVF boluses. Given 5mg  vit K and 2u FFP for INR 8.24. 7/22 - VT/PEA cardiac arrest, VDRF, seizure  Lines/Drains: HD Catheter Right Femoral 7/22 NG 7/22 ETT 7/22 Arterial Line 7/22  SUBJECTIVE / Interval events:  Overnight was noted to have afib with RVR, HR 160s. Amiodarone IV bolus given.  VITAL SIGNS: Temp:  [97.4 F (36.3 C)-98.5 F (36.9 C)] 97.4 F (36.3 C) (07/28 0414) Pulse Rate:  [25-121] 39 (07/28 0615) Resp:  [13-56] 21 (07/28 0615) BP: (67-121)/(33-98) 121/60 mmHg (07/28 0600) SpO2:  [2 %-100 %] 36 % (07/28 0615) Arterial Line BP: (79-121)/(36-64) 105/56 mmHg (07/28 0615) FiO2 (%):  [40 %] 40 % (07/28 0321) Weight:  [196 lb 8 oz (89.132 kg)] 196 lb 8 oz (89.132 kg) (07/28 0500) HEMODYNAMICS:   VENTILATOR SETTINGS: Vent Mode:  [-] PRVC FiO2 (%):  [40 %] 40 % Set Rate:  [20 bmp] 20 bmp Vt  Set:  [500 mL] 500 mL PEEP:  [5 cmH20] 5 cmH20 Plateau Pressure:  [17 cmH20-21 cmH20] 21 cmH20 INTAKE / OUTPUT:  Intake/Output Summary (Last 24 hours) at 04/10/15 8502 Last data filed at 04/10/15 0600  Gross per 24 hour  Intake 2532.28 ml  Output   3658 ml  Net -1125.72 ml   PHYSICAL EXAMINATION: General: ill appearing Neuro: rass 0, follows commands HEENT: ETT in place Cardiovascular: s1 s2 irregular Lungs: scattered crackles Abdomen:  Soft, no organomegaly Musculoskeletal:  Left AKA, right BKA, right middle finger amputated Skin:  Unstageable decubitus sacral ulcers bilateral x2 (one on right and one on left)  LABS:  CBC  Recent Labs Lab 04/07/15 0415 04/08/15 0410 04/09/15 0347  WBC 24.0* 20.4* 25.3*  HGB 14.5 14.0 14.2  HCT 43.0 41.9 41.8  PLT 175 146* 148*   Coag's  Recent Labs Lab 04/08/15 0410 04/09/15 0347 04/10/15 0420  APTT 55* 56* 45*  INR 3.43* 3.77* 2.94*   BMET  Recent Labs Lab 04/09/15 0347 04/09/15 1600 04/10/15 0420  NA 130* 131* 131*  K 3.8 3.7 3.9  CL 103 101 103  CO2 20* 21* 21*  BUN 6 8 8   CREATININE 1.00 0.95 0.80  GLUCOSE 175* 195* 170*   Electrolytes  Recent Labs Lab 04/09/15 0347 04/09/15 1600 04/09/15 1608 04/10/15 0420  CALCIUM 7.1* 7.3*  --  7.4*  MG 2.0  --  2.0 2.1  PHOS 2.1* 1.7* 1.7* 1.7*  1.7*  Sepsis Markers  Recent Labs Lab 04/04/15 1327 04/04/15 1800 04/05/15 0406  LATICACIDVEN 8.85* 4.3* 2.3*   ABG  Recent Labs Lab 04/04/15 1458 04/04/15 1818 04/05/15 0305  PHART 7.179* 7.367 7.480*  PCO2ART 34.4* 30.6* 26.9*  PO2ART 87.0 157.0* 174*     Liver Enzymes  Recent Labs Lab 04/06/15 0505  04/08/15 0410  04/09/15 0347 04/09/15 1600 04/10/15 0420  AST 112*  --  36  --   --   --  26  ALT 93*  --  57*  --   --   --  42  ALKPHOS 87  --  91  --   --   --  120  BILITOT 1.4*  --  1.6*  --   --   --  1.7*  ALBUMIN 1.2*  < > 1.2*  < > 1.1* 1.1* 1.1*  1.2*  < > = values in this interval  not displayed. Cardiac Enzymes  Recent Labs Lab 04/05/15 0806 04/05/15 2209 04/09/15 1030  TROPONINI 1.23* 1.05* 0.52*   Glucose  Recent Labs Lab 04/09/15 0755 04/09/15 1132 04/09/15 1723 04/09/15 1938 04/09/15 2327 04/10/15 0406  GLUCAP 140* 161* 155* 162* 149* 159*   Imaging Dg Chest Port 1 View  04/09/2015   CLINICAL DATA:  Pulmonary edema  EXAM: PORTABLE CHEST - 1 VIEW  COMPARISON:  04/07/2015  FINDINGS: Endotracheal tube 4.6 cm above the carina. NG tube extends below the hemidiaphragms into the stomach with the tip not visualized. Stable cardiomegaly and prior coronary bypass changes noted. Increased left mid and lower lung atelectasis/ consolidation but improved left upper lobe aeration. Small left effusion suspected. Right lung remains clear. Negative for pneumothorax.  IMPRESSION: Stable support apparatus.  Stable cardiomegaly and postoperative findings  Increased left perihilar and basilar collapse/ consolidation but improving left upper lobe aeration.  Trace left effusion.   Electronically Signed   By: Jerilynn Mages.  Shick M.D.   On: 04/09/2015 08:04   ASSESSMENT / PLAN:  CARDIOVASCULAR A:  VT/PEA arrest 7/22 >> likely from metabolic derangements. A fib with RVR on coumadin as outpt, currently on hold Chronic hypotension. Chronic systolic CHF >> baseline EF 30 to 45%. Suspected Ischemia Pulmonary HTN (PA 66) Hx of CAD.  P:  Continue amiodarone per tube Holding home coumadin. Heparin gtt to start when INR < 2.0. Continue ASA, atorvastatin Midodrine Neo, Vasopressin, MAP goal 50 with good mental status  Discuss goals of care with family / pt today - if aggressive care sought will place swan and consider pulm vasodilator, NO May need amio bolus IV and drip, currently controlled Trop unimpressive, no repeat  PULMONARY ETT 7/22>>> A: Acute respiratory failure in setting of cardiac arrest. Hx of OSA.  P:  PS attempts cpap 5/, ps to 10 She has immediate failure of  SBT with hypotension - ischemia? Assess doppler uppers, consider CT angio with such failure on weaning but she had INR high with code; but lupus anticoagulant in differential which cause ptt elevation (C4 normal, C3 low. lupus anticoag, CD50, mixing studies, ANA pending) Neg balance is goal remains, despite fib   RENAL A:  ESRD (0.80) - pre-hospitalization dialysis schedule MWF. Hyponatremia (131)  P:  CRRT initiated 7/22>> Goal I<O with CRRT Nephrology following NS to [facility]  GASTROINTESTINAL A: r/o shock liver Nutrition.  P:  Tube Feeds Protonix for SUP Repeat LF in am  HEMATOLOGIC A:  Leukocytosis  INR 2.94 Unclear etiology continued coagulapathy At risk factor inhibitor with h/o lupus, at risk  lupus anticoagulant  P:  F/u CBC in am  Monitor INR Holding Heparin until INR <2 Follow up mixing studies, lupus anticoag, CD50, ANA. C3 low. C4 normal.  INFECTIOUS A:  Septic shock - most likely secondary to Sacral decub ulcer. HCAP  P:  BCx2 7/18 >>No Growth Wound Cx 7/18 >> Multiple organs present, none predominant C diff neg  Abx: Vanc, start date 7/18>>>7/25 Abx: Zosyn, start date 7/18>>>7/27 Wound care team following Follow wbc further, was neg 1.3 liters with hemoconcentration afect  ENDOCRINE A:  DM type II. Hypoglycemic. 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 Hx Neuropathy, TIA.  P:  RASS goal 0 Continue keppra Ativan PRN Versed, Fentanyl PRN  Dr. Junie Panning, DO Family Medicine, PGY-2  04/10/2015, 6:21 AM  STAFF NOTE: I, Merrie Roof, MD FACP have personally reviewed patient's available data, including medical history, events of note, physical examination and test results as part of my evaluation. I have discussed with  resident/NP and other care providers such as pharmacist, RN and RRT. In addition, I personally evaluated patient and elicited key findings of: seems to have a poor prognosis with severe pulm htn EF 35%, appears that pulm htn playing a huge role in weaning failures, neg balance maintaining with cvvhd, will d/w pt to consider less heroic means, if aggressive care sought would swan and consider addition NO then oral agent for pa HTN, amio may need drip, reaplce phos conservative , PS 10 attempt, would tolerate rr 35-40 tv 250, maintain neg balance, need goals of care discussion The patient is critically ill with multiple organ systems failure and requires high complexity decision making for assessment and support, frequent evaluation and titration of therapies, application of advanced monitoring technologies and extensive interpretation of multiple databases.   Critical Care Time devoted to patient care services described in this note is30 Minutes. This time reflects time of care of this signee: Merrie Roof, MD FACP. This critical care time does not reflect procedure time, or teaching time or supervisory time of PA/NP/Med student/Med Resident etc but could involve care discussion time. Rest per NP/medical resident whose note is outlined above and that I agree with   Lavon Paganini. Titus Mould, MD, [place] Pgr: [place] [facility] 04/10/2015 8:53 AM

## 2015-04-10 NOTE — Progress Notes (Signed)
eLink Physician-Brief Progress Note Patient Name: [name] DOB: 1956-05-09 MRN: [id number]   Date of Service  04/10/2015  HPI/Events of Note  Patient extubated today and unable to swallow. Patient has several PO medications ordered.   eICU Interventions  Replace NGT.     Intervention Category Minor Interventions: Routine modifications to care plan (e.g. PRN medications for pain, fever)  [name] 04/10/2015, 5:11 PM

## 2015-04-11 DIAGNOSIS — J810 Acute pulmonary edema: Secondary | ICD-10-CM

## 2015-04-11 DIAGNOSIS — J811 Chronic pulmonary edema: Secondary | ICD-10-CM | POA: Insufficient documentation

## 2015-04-11 DIAGNOSIS — Z9911 Dependence on respirator [ventilator] status: Secondary | ICD-10-CM

## 2015-04-11 DIAGNOSIS — Z789 Other specified health status: Secondary | ICD-10-CM

## 2015-04-11 DIAGNOSIS — Z978 Presence of other specified devices: Secondary | ICD-10-CM | POA: Insufficient documentation

## 2015-04-11 LAB — APTT: aPTT: 42 seconds — ABNORMAL HIGH (ref 24–37)

## 2015-04-11 LAB — RENAL FUNCTION PANEL
Albumin: 1.2 g/dL — ABNORMAL LOW (ref 3.5–5.0)
Anion gap: 8 (ref 5–15)
BUN: 6 mg/dL (ref 6–20)
CALCIUM: 7.6 mg/dL — AB (ref 8.9–10.3)
CO2: 22 mmol/L (ref 22–32)
CREATININE: 0.81 mg/dL (ref 0.44–1.00)
Chloride: 102 mmol/L (ref 101–111)
GFR calc Af Amer: >60 mL/min (ref >60)
Glucose, Bld: 175 mg/dL — ABNORMAL HIGH (ref 65–99)
Phosphorus: 2.9 mg/dL (ref 2.5–4.6)
Potassium: 4 mmol/L (ref 3.5–5.1)
SODIUM: 132 mmol/L — AB (ref 135–145)

## 2015-04-11 LAB — PTT-LA MIX: PTT-LA MIX: 51.7 s — AB (ref 0.0–50.0)

## 2015-04-11 LAB — MAGNESIUM: Magnesium: 2.1 mg/dL (ref 1.7–2.4)

## 2015-04-11 LAB — LUPUS ANTICOAGULANT PANEL
DRVVT: 56.5 s — ABNORMAL HIGH (ref 0.0–55.1)
PTT Lupus Anticoagulant: 93.5 s — ABNORMAL HIGH (ref 0.0–50.0)

## 2015-04-11 LAB — CBC
HCT: 41 % (ref 36.0–46.0)
Hemoglobin: 13.9 g/dL (ref 12.0–15.0)
MCH: 28.3 pg (ref 26.0–34.0)
MCHC: 33.9 g/dL (ref 30.0–36.0)
MCV: 83.5 fL (ref 78.0–100.0)
Platelets: 105 10*3/uL — ABNORMAL LOW (ref 150–400)
RBC: 4.91 MIL/uL (ref 3.87–5.11)
RDW: 18.7 % — ABNORMAL HIGH (ref 11.5–15.5)
WBC: 28 10*3/uL — ABNORMAL HIGH (ref 4.0–10.5)

## 2015-04-11 LAB — PROTIME-INR
INR: 2.32 — AB (ref 0.00–1.49)
Prothrombin Time: 25.2 seconds — ABNORMAL HIGH (ref 11.6–15.2)

## 2015-04-11 LAB — GLUCOSE, CAPILLARY
Glucose-Capillary: 140 mg/dL — ABNORMAL HIGH (ref 65–99)
Glucose-Capillary: 144 mg/dL — ABNORMAL HIGH (ref 65–99)
Glucose-Capillary: 144 mg/dL — ABNORMAL HIGH (ref 65–99)

## 2015-04-11 LAB — HEXAGONAL PHASE PHOSPHOLIPID: Hexagonal Phase Phospholipid: 26.3 s — ABNORMAL HIGH (ref 0.0–8.0)

## 2015-04-11 LAB — DRVVT MIX: dRVVT Mix: 39.2 s (ref 0.0–45.4)

## 2015-04-11 MED ORDER — MORPHINE SULFATE 2 MG/ML IJ SOLN: 2.0000 mg | INTRAMUSCULAR | Status: DC | PRN

## 2015-04-11 MED ORDER — SODIUM CHLORIDE 0.9 % IV BOLUS (SEPSIS): 500.0000 mL | Freq: Once | INTRAVENOUS | Status: DC

## 2015-04-11 MED ORDER — MORPHINE SULFATE 25 MG/ML IV SOLN
10.0000 mg/h | INTRAVENOUS | Status: DC
  Filled 2015-04-11: qty 10

## 2015-04-11 MED ORDER — SODIUM CHLORIDE 0.9 % IV BOLUS (SEPSIS)
250.0000 mL | Freq: Once | INTRAVENOUS | Status: AC
  Administered 2015-04-11: 250 mL via INTRAVENOUS

## 2015-04-11 MED ORDER — MORPHINE BOLUS VIA INFUSION
5.0000 mg | INTRAVENOUS | Status: DC | PRN
Start: 2015-04-11 — End: 2015-04-11
  Filled 2015-04-11: qty 20

## 2015-04-11 MED ORDER — SODIUM CHLORIDE 0.9 % IV SOLN
1.0000 mg/h | INTRAVENOUS | Status: DC
  Administered 2015-04-11: 2 mg/h via INTRAVENOUS
  Filled 2015-04-11: qty 10

## 2015-04-12 LAB — GLUCOSE, CAPILLARY: GLUCOSE-CAPILLARY: 146 mg/dL — AB (ref 65–99)

## 2015-04-14 NOTE — Progress Notes (Signed)
288mL of morphine wasted in the sink and witnessed by [employer]

## 2015-04-14 NOTE — Clinical Social Work Note (Signed)
Clinical Social Worker notified of patient transitioning to comfort care. Patient will no longer require SNF placement at this time. CSW will continue to follow patient and pt's family for continued support.   CSW remains available as needed.   Glendon Axe, MSW, LCSWA [phone] 03-May-2015 2:17 PM

## 2015-04-14 NOTE — Progress Notes (Addendum)
PULMONARY / CRITICAL CARE MEDICINE   Name: [name] MRN: [id number] DOB: 08-22-1956    ADMISSION DATE:  04/07/2015 CONSULTATION DATE:  04/01/15  REFERRING MD :  Eliseo Squires  CHIEF COMPLAINT:  Hypotension, Sepsis secondary to Decubitus Ulcers  INITIAL PRESENTATION:  59 y.o. F with ESRD (MWF) admitted to [facility] 7/18 with leukocytosis and elevated lactate. On 7/19, had persistent hypotension despite fluid boluses; therefore, PCCM called for ICU transfer.  STUDIES:  7/18 CXR >> No acute cardiopulmonary disease 7/22 CXR >>ETT and NG tube in place, opacity of left lung apex consolidation vs airspace collapse 7/22 KUB >> NG tube and a line in place 7/23 CXR >>LUL opacity could be pneumonia 7/23 EEG >> no seizure activity 7/25 CXR >> improved edema, possible pneumonia vs pulmonary edema in left apex 7/27 CXR >> improved 7/27 Echo >> EF 30-35%, moderately to severely reduced LV function, diffuse LV hypokinesis, PA pressure 66, left pleural effusion 7/27 LE Doppler >> no evidence of DVT  SIGNIFICANT EVENTS: 7/18 - admit. 7/19 - transfer to ICU for hypotension not responding to IVF boluses. Given 5mg  vit K and 2u FFP for INR 8.24. 7/22 - VT/PEA cardiac arrest, VDRF, seizure 7/28 - Extubated  Lines/Drains: HD Catheter Right Femoral 7/22 NG 7/22 ETT 7/22 Arterial Line 7/22  SUBJECTIVE / Interval events:  Significant hypotension to 40/23 and agonal breathing noted overnight while on Neo. Was noted to be unresponsive Levophed drip ordered. Family called to bedside. BP improved with levophed and became responsive to voice, however still lethargic.  VITAL SIGNS: Temp:  [97.4 F (36.3 C)] 97.4 F (36.3 C) (07/28 2043) Pulse Rate:  [25-152] 96 (07/29 0100) Resp:  [14-40] 23 (07/29 0630) BP: (78-90)/(41-57) 90/57 mmHg (07/28 1143) SpO2:  [0 %-99 %] 0 % (07/29 0100) Arterial Line BP: (49-118)/(27-60) 94/46 mmHg (07/29 0630) FiO2 (%):  [40 %] 40 % (07/28 1143) Weight:  [196 lb 3.4 oz (89  kg)] 196 lb 3.4 oz (89 kg) (07/29 0421) HEMODYNAMICS:   VENTILATOR SETTINGS: Vent Mode:  [-] PSV;CPAP FiO2 (%):  [40 %] 40 % Set Rate:  [20 bmp] 20 bmp Vt Set:  [500 mL] 500 mL PEEP:  [5 cmH20] 5 cmH20 Pressure Support:  [5 cmH20-10 cmH20] 5 cmH20 Plateau Pressure:  [17 cmH20] 17 cmH20 INTAKE / OUTPUT:  Intake/Output Summary (Last 24 hours) at 2015-04-27 0636 Last data filed at 04-27-2015 0602  Gross per 24 hour  Intake 3022.01 ml  Output   3629 ml  Net -606.99 ml   PHYSICAL EXAMINATION: General: ill appearing Neuro: rass -2, follows commands, more lethargic HEENT: [place] in place Cardiovascular: s1 s2 irregular Lungs: scattered crackles Abdomen:  Soft, no organomegaly Musculoskeletal:  Left AKA, right BKA, right middle finger amputated Skin:  Unstageable decubitus sacral ulcers bilateral x2 (one on right and one on left)  LABS:  CBC  Recent Labs Lab 04/08/15 0410 04/09/15 0347 April 27, 2015 0330  WBC 20.4* 25.3* PENDING  HGB 14.0 14.2 13.9  HCT 41.9 41.8 41.0  PLT 146* 148* PENDING   Coag's  Recent Labs Lab 04/09/15 0347 04/09/15 1030 04/10/15 0420 27-Apr-2015 0330  APTT 56* 50.9* 45* 42*  INR 3.77*  --  2.94* 2.32*   BMET  Recent Labs Lab 04/10/15 0420 04/10/15 1649 Apr 27, 2015 0330  NA 131* 132* 132*  K 3.9 4.1 4.0  CL 103 101 102  CO2 21* 22 22  BUN 8 7 6   CREATININE 0.80 0.86 0.81  GLUCOSE 170* 157* 175*   Electrolytes  Recent Labs Lab 04/09/15 1608 04/10/15 0420 04/10/15 1649 May 06, 2015 0330  CALCIUM  --  7.4* 7.6* 7.6*  MG 2.0 2.1  --  2.1  PHOS 1.7* 1.7*  1.7* 3.0 2.9   Sepsis Markers  Recent Labs Lab 04/04/15 1327 04/04/15 1800 04/05/15 0406  LATICACIDVEN 8.85* 4.3* 2.3*   ABG  Recent Labs Lab 04/04/15 1458 04/04/15 1818 04/05/15 0305  PHART 7.179* 7.367 7.480*  PCO2ART 34.4* 30.6* 26.9*  PO2ART 87.0 157.0* 174*     Liver Enzymes  Recent Labs Lab 04/06/15 0505  04/08/15 0410  04/10/15 0420 04/10/15 1649 05-06-15 0330   AST 112*  --  36  --  26  --   --   ALT 93*  --  57*  --  42  --   --   ALKPHOS 87  --  91  --  120  --   --   BILITOT 1.4*  --  1.6*  --  1.7*  --   --   ALBUMIN 1.2*  < > 1.2*  < > 1.1*  1.2* 1.2* 1.2*  < > = values in this interval not displayed. Cardiac Enzymes  Recent Labs Lab 04/05/15 0806 04/05/15 2209 04/09/15 1030  TROPONINI 1.23* 1.05* 0.52*   Glucose  Recent Labs Lab 04/10/15 0811 04/10/15 1111 04/10/15 1525 04/10/15 1953 05/06/2015 0026 2015/05/06 0339  GLUCAP 197* 157* 149* 94 144* 144*   Imaging Dg Chest Port 1 View  04/09/2015   CLINICAL DATA:  Pulmonary edema  EXAM: PORTABLE CHEST - 1 VIEW  COMPARISON:  04/07/2015  FINDINGS: Endotracheal tube 4.6 cm above the carina. NG tube extends below the hemidiaphragms into the stomach with the tip not visualized. Stable cardiomegaly and prior coronary bypass changes noted. Increased left mid and lower lung atelectasis/ consolidation but improved left upper lobe aeration. Small left effusion suspected. Right lung remains clear. Negative for pneumothorax.  IMPRESSION: Stable support apparatus.  Stable cardiomegaly and postoperative findings  Increased left perihilar and basilar collapse/ consolidation but improving left upper lobe aeration.  Trace left effusion.   Electronically Signed   By: Jerilynn Mages.  Shick M.D.   On: 04/09/2015 08:04   ASSESSMENT / PLAN:  CARDIOVASCULAR A:  VT/PEA arrest 7/22 >> likely from metabolic derangements. A fib with RVR on coumadin as outpt, currently on hold Chronic hypotension. Chronic systolic CHF >> baseline EF 30 to 45%. Suspected Ischemia Pulmonary HTN (PA 66) Hx of CAD.  P:  Continue amiodarone per tube Holding home coumadin. Heparin gtt to start when INR < 2.0. Continue ASA, atorvastatin Midodrine Neo, Vasopressin, MAP goal 50 with good mental status  Levophed gtt initiated overnight, no escallation DNR Poor prognosis  PULMONARY ETT 7/22>>>7/28 A: Acute respiratory failure in  setting of cardiac arrest. Hx of OSA.  P:  Currently on Dix--wean as able Neg balance is goal remains, despite fib  DNI escalation O2 will allow suffering  RENAL A:  ESRD (0.81) - pre-hospitalization dialysis schedule MWF. Hyponatremia (132)  P:  CRRT initiated 7/22>>7/29 Goal I<O with CRRT Nephrology following NS to [facility] Unable to tolerate cvvhd  GASTROINTESTINAL A:  Nutrition.  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 HCAP wrosenign WBC  P:  BCx2 7/18 >>No Growth Wound Cx 7/18 >> Multiple organs present, none predominant C diff neg  Abx: Vanc, start date 7/18>>>7/25 Abx: Zosyn, start date 7/18>>>7/27 Wound care team following Follow wbc further, was neg 1.3 liters with hemoconcentration afect  If aggressive, consider restart zosyn  ENDOCRINE A:  DM type II. Hypoglycemic. 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 Hx Neuropathy, TIA.  P:  RASS goal 0 Continue keppra Ativan PRN Versed, Fentanyl PRN likley needs morphine infusion  Dr. Junie Panning, DO Family Medicine, PGY-2  04/21/15, 6:36 AM  STAFF NOTE: I, Merrie Roof, MD FACP have personally reviewed patient's available data, including medical history, events of note, physical examination and test results as part of my evaluation. I have discussed with resident/NP and other care providers such as pharmacist, RN and RRT. In  addition, I personally evaluated patient and elicited key findings of: this am unresponsive, refractory shock, levo was at 5 mic, neo vaso, will not change her outcome, not tolerating cvvhd, refractory shock,. Dc cvvhd, cosnider full comfort care, noted lupus anticoagulant which explains prior coags and likley pa htn, family updated in full, dc aline The patient is critically ill with multiple organ systems failure and requires high complexity decision making for assessment and support, frequent evaluation and titration of therapies, application of advanced monitoring technologies and extensive interpretation of multiple databases.   Critical Care Time devoted to patient care services described in this note is 30 Minutes. This time reflects time of care of this signee: Merrie Roof, MD FACP. This critical care time does not reflect procedure time, or teaching time or supervisory time of PA/NP/Med student/Med Resident etc but could involve care discussion time. Rest per NP/medical resident whose note is outlined above and that I agree with   Lavon Paganini. Titus Mould, MD, [place] Pgr: [facility] April 21, 2015 10:03 AM   Update:  Family would like to maintain pressors if needed, can titrate off if able and dc and not restart, off cvvhd as not tolerating, no restart planned after off appropriatly, if any distress pain, focus 100% to comfort  Lavon Paganini. Titus Mould, MD, [place] Pgr: [place] [facility]

## 2015-04-14 NOTE — Progress Notes (Signed)
Patient went unresponsive, eyes rolled back, and blood pressure began to drop per Aline. Patient was already a DNR with plans to transition to comfort if patient condition worsens. Family was called to the bedside. Approx 2 mins later, the patient became responsive again.

## 2015-04-14 NOTE — Progress Notes (Signed)
Nutrition Brief Note  Chart reviewed.  Spoke with RN. Pt now transitioning to comfort care.  No further nutrition interventions warranted at this time.  Please re-consult as needed.   Arthur Holms, RD, LDN Pager #: [phone] After-Hours Pager #: [phone]

## 2015-04-14 NOTE — Progress Notes (Signed)
At 1539 the patient went asystole. No heart beat was auscultated and no spontaneous reparations were noted. Pupils were fixed and non reactive to light. Death was verified by Drue Stager and [facility] RN. Dr. Titus Mould was notified.

## 2015-04-14 NOTE — Progress Notes (Signed)
[facility] Progress Note   Subjective: frequently clotting CRRT filters. BP's lower and on 3 pressors now.     Filed Vitals:   04-26-15 0645 04/26/2015 0700 Apr 26, 2015 0736 April 26, 2015 0800  BP:      Pulse:  103    Temp:   97.4 F (36.3 C)   TempSrc:   Axillary   Resp: 20 20  28   Height:      Weight:      SpO2:  8%     Exam: Extubated, lethargic No jvd Chest clear bilat RRR no RG Abd scars, some induration abd pannus, looks chronic Sacral area several areas of superficial skin breakdown, no signs of infection Uterine prolapse R BK stump no edema L AK stump clean, no drainage  MWF [facility]  81kg   4h   3/2.5 bath  Hep 8000  RFA AVF Calcitriol 0.25 ug VEnofer 50/wk      Assessment: 1. Shock / ESRD- severe bivent heart failure/ pulm HTN and prob sepsis. Pressor-dependent, not improving. Not tolerating CRRT which is being stopped this morning. No plan to resume CRRT. If improves to the point of being able to tolerate regular HD we can try regular HD but not a candidate at this time.  Will follow.  2. S/P cardiac arrest (VT/ torsade) 3. Vol excess - up 8-9kg 4. Anemia Hb 14 no esa 5. Afib on anticoagulation 6. Hypophos - pharm is replacing 7. DNR  Plan - CRRT to be stopped. No dialysis options at this moment. Will follow.     Kelly Splinter MD  pager [phone]    cell [phone]  26-Apr-2015, 10:53 AM     Recent Labs Lab 04/10/15 0420 04/10/15 1649 04/26/2015 0330  NA 131* 132* 132*  K 3.9 4.1 4.0  CL 103 101 102  CO2 21* 22 22  GLUCOSE 170* 157* 175*  BUN 8 7 6   CREATININE 0.80 0.86 0.81  CALCIUM 7.4* 7.6* 7.6*  PHOS 1.7*  1.7* 3.0 2.9    Recent Labs Lab 04/06/15 0505  04/08/15 0410  04/10/15 0420 04/10/15 1649 04/26/15 0330  AST 112*  --  36  --  26  --   --   ALT 93*  --  57*  --  42  --   --   ALKPHOS 87  --  91  --  120  --   --   BILITOT 1.4*  --  1.6*  --  1.7*  --   --   PROT 5.6*  --  5.4*  --  5.5*  --   --   ALBUMIN 1.2*  < > 1.2*   < > 1.1*  1.2* 1.2* 1.2*  < > = values in this interval not displayed.  Recent Labs Lab 04/08/15 0410 04/09/15 0347 April 26, 2015 0330  WBC 20.4* 25.3* 28.0*  HGB 14.0 14.2 13.9  HCT 41.9 41.8 41.0  MCV 83.1 82.9 83.5  PLT 146* 148* 105*   . amiodarone  100 mg Per Tube Daily  . antiseptic oral rinse  7 mL Mouth Rinse QID  . aspirin  81 mg Per Tube Daily  . atorvastatin  10 mg Per Tube q1800  . chlorhexidine  15 mL Mouth Rinse BID  . insulin aspart  0-9 Units Subcutaneous 6 times per day  . levETIRAcetam  500 mg Per Tube BID  . midodrine  10 mg Oral TID WC  . multivitamin  1 tablet Oral QHS  . pantoprazole sodium  40 mg  Per Tube Q24H  . sodium chloride  500 mL Intravenous Once  . sodium chloride  3 mL Intravenous Q12H   . sodium chloride 10 mL/hr (04/09/15 0748)  . norepinephrine (LEVOPHED) Adult infusion 2 mcg/min (Apr 27, 2015 0725)  . phenylephrine (NEO-SYNEPHRINE) Adult infusion 300 mcg/min (April 27, 2015 0725)  . dialysis replacement fluid (prismasate) 1,500 mL/hr at Apr 27, 2015 5397  . dialysis replacement fluid (prismasate) 300 mL/hr at 04/27/15 0812  . dialysate (PRISMASATE) 1,000 mL/hr at 04-27-15 0812  . vasopressin (PITRESSIN) infusion - *FOR SHOCK* 0.03 Units/min (April 27, 2015 0009)   acetaminophen (TYLENOL) oral liquid 160 mg/5 mL, fentaNYL (SUBLIMAZE) injection, Gerhardt's butt cream, heparin, hydrOXYzine, LORazepam, morphine injection, [DISCONTINUED] ondansetron **OR** ondansetron (ZOFRAN) IV, sodium chloride, white petrolatum

## 2015-04-14 NOTE — Progress Notes (Signed)
Pt became unresponsive and blood pressure began to drop. Dr. Titus Mould was notified and the family was asked to come to the hospital immediately. Levo was restarted with an understanding to transition to comfort care after the family had come to see the patient.

## 2015-04-14 NOTE — Progress Notes (Signed)
Chaplain responded to consult.  Met with family at bedside.  Pt awake, chaplain provided emotional and spiritual support for pt and family and will continue to follow up as needed.      04-19-2015 1100  Clinical Encounter Type  Visited With Patient and family together;Health care provider  Visit Type Follow-up  Referral From Physician  Spiritual Encounters  Spiritual Needs Emotional  Stress Factors  Patient Stress Factors Exhausted;Health changes  Family Stress Factors None identified   Geralyn Flash 04-19-2015 11:43 AM

## 2015-04-14 DEATH — deceased

## 2015-04-15 ENCOUNTER — Telehealth: Payer: Self-pay

## 2015-04-15 NOTE — Care Management Note (Signed)
Case Management Note  Patient Details  Name: [name] MRN: [id number] Date of Birth: Sep 27, 1955  Subjective/Objective:                    Action/Plan:   Expected Discharge Date:                  Expected Discharge Plan:  Skilled Nursing Facility  In-House Referral:  Clinical Social Work  Discharge planning Services  CM Consult  Post Acute Care Choice:    Choice offered to:  Patient  DME Arranged:    DME Agency:     HH Arranged:    [place] Agency:  [place]  Status of Service:  Completed, signed off  Medicare Important Message Given:    Date Medicare IM Given:    Medicare IM give by:    Date Additional Medicare IM Given:    Additional Medicare Important Message give by:     If discussed at [place] of Stay Meetings, dates discussed:    Additional Comments:  Vergie Living, RN 04/15/2015, 8:34 AM

## 2015-04-15 NOTE — Telephone Encounter (Signed)
On Apr 25, 2015 I received a death certificate from [employer]. The death certificate is for cremation. The patient is a patient of Doctor Titus Mould. The death certificate will be taken to [facility] for signature this am. On 04/25/2015 I received the death certificate back from [place]. I got the death certificate ready for pickup. I called the funeral home to let them know it was ready for pickup and also faxed them a copy per their request.

## 2015-04-23 ENCOUNTER — Other Ambulatory Visit: Payer: Self-pay | Admitting: Interventional Cardiology

## 2015-05-06 ENCOUNTER — Ambulatory Visit: Payer: Medicare Other | Admitting: Internal Medicine

## 2015-05-15 NOTE — Discharge Summary (Signed)
NAMEKEILYN, HAGGARD NO.:  [id number]  MEDICAL RECORD NO.:  [id number]  LOCATION:  2M04C                        FACILITY:  [facility]  PHYSICIAN:  Raylene Miyamoto, MD DATE OF BIRTH:  1956-03-06  DATE OF ADMISSION:  03/24/2015 DATE OF DISCHARGE:  2015-04-18                              DISCHARGE SUMMARY   Death SUMMARY  This is a 59 year old female with end-stage renal disease, admitted to the [facility] on March 31, 2015, with leukocytosis and elevated lactic acid level.  On April 01, 2015, she had persistent hypotension despite fluid boluses.  Therefore, Critical Care was called for ICU transfer.  Significant events including the patient being admitted on March 31, 2015.  On April 01, 2015, she was transferred to the ICU, triggered the hypotension, and responding IV fluid boluses.  Given vitamin K and FFP secondary to INR of 8, the patient unfortunately suffered a VT, PEA arrest on April 04, 2015, with vent-dependent respiratory failure and seizures and was extubated on the 28th.  She did have an echocardiogram on the 27th, which showed an ejection fraction of 30-50%, moderately severely reduced LV function.  She has LV hypokinesis, PA pressure is 66.  She had an HD catheter in the right femoral region on the 22nd and endotracheal tube on 22nd and arterial line on 22nd.  Eventually on the 29th, she had significant of hypotension, agonal breathing and noted overnight, Neo-Synephrine was noted to be unresponsive.  __________ was ordered and was called to bedside blood pressure, improved with Levophed and became responsive to voice only.  However, she was still lethargic.  She made the decision to not undergo aggressive heroic measures.  To note she had PA pressure that was significantly elevated.  She was then at that phase with significant hypotension, unable to even tolerate CVVHD and was supportive on her decision not to undergo heroic measures  other than __________ morphine for comfort.  She was on low-dose morphine, she remained to be agonal essentially.  To note, pulmonary hypertension may be secondary to lupus anticoagulant.  During the hospitalization, she had a positive lupus anticoagulant diagnosed by mixing study, but was likely more of a chronic disease.  She was treated with significant antibiotics with vancomycin and Zosyn.  During hospitalization, the patient continued to declined under semi-comfort care measures and expired.  FINAL DIAGNOSES UPON DEATH: 1. 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 2. Rule out ischemia. 3. Congestive heart failure. 4. Septic shock. 5. End-stage renal disease. 6. Status post ventricular tachycardia, pulseless electrical activity     arrest, most likely secondary to preload reduction in the setting     of pulmonary hypertension.     Raylene Miyamoto, MD     DJF/MEDQ  D:  04/29/2015  T:  04/30/2015  Job:  [id number]

## 2015-06-24 ENCOUNTER — Ambulatory Visit: Payer: Medicare Other | Admitting: Internal Medicine

## 2015-07-01 ENCOUNTER — Ambulatory Visit: Payer: Medicare Other | Admitting: Internal Medicine

## 2015-11-16 IMAGING — DX DG CHEST 2V
2 series · 2 of 2 positions shown · non-contrast
Comparison: 11/25/2014

CLINICAL DATA: Shortness of breath.  Chest pain.

EXAM:
CHEST  2 VIEW

[chest lat]
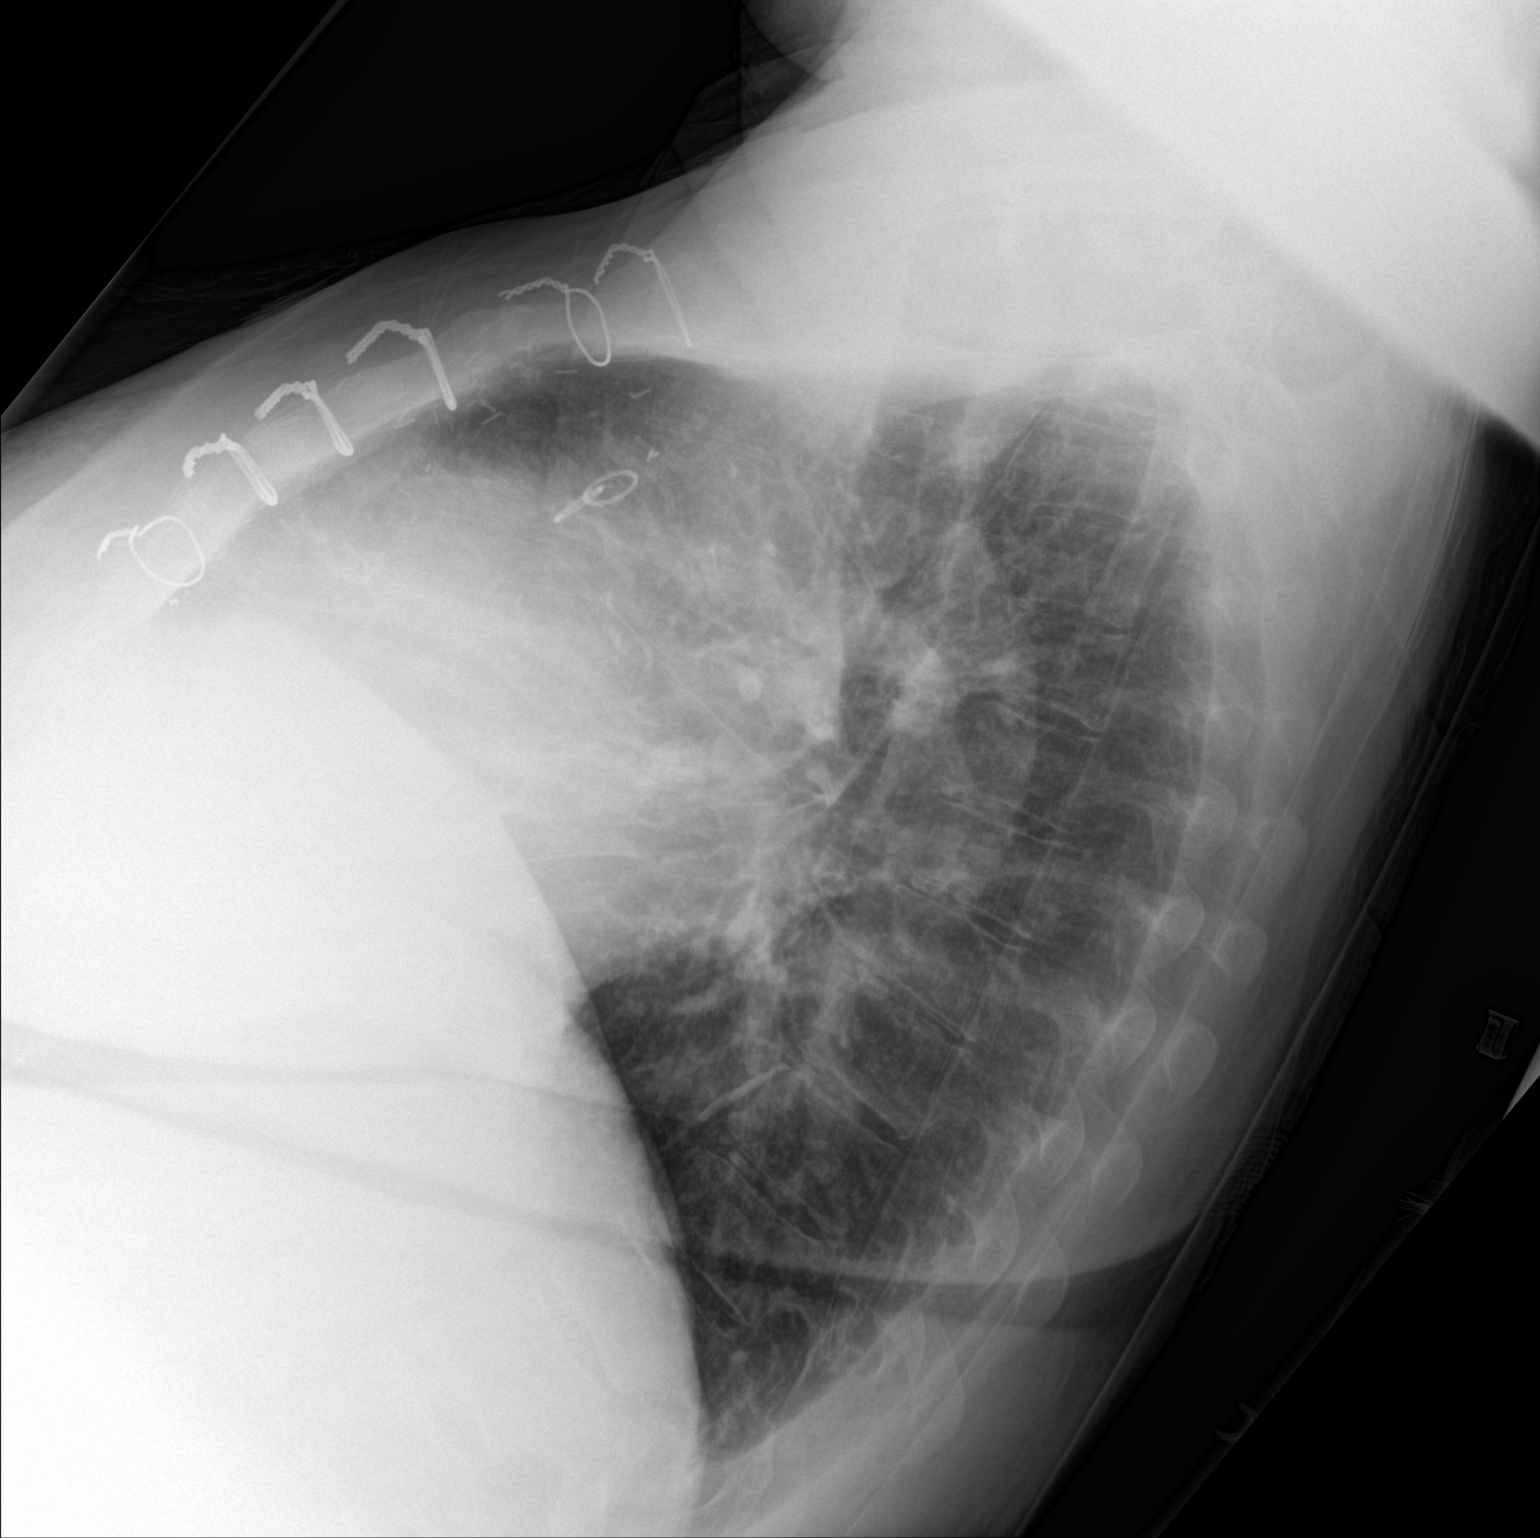

[chest ap]
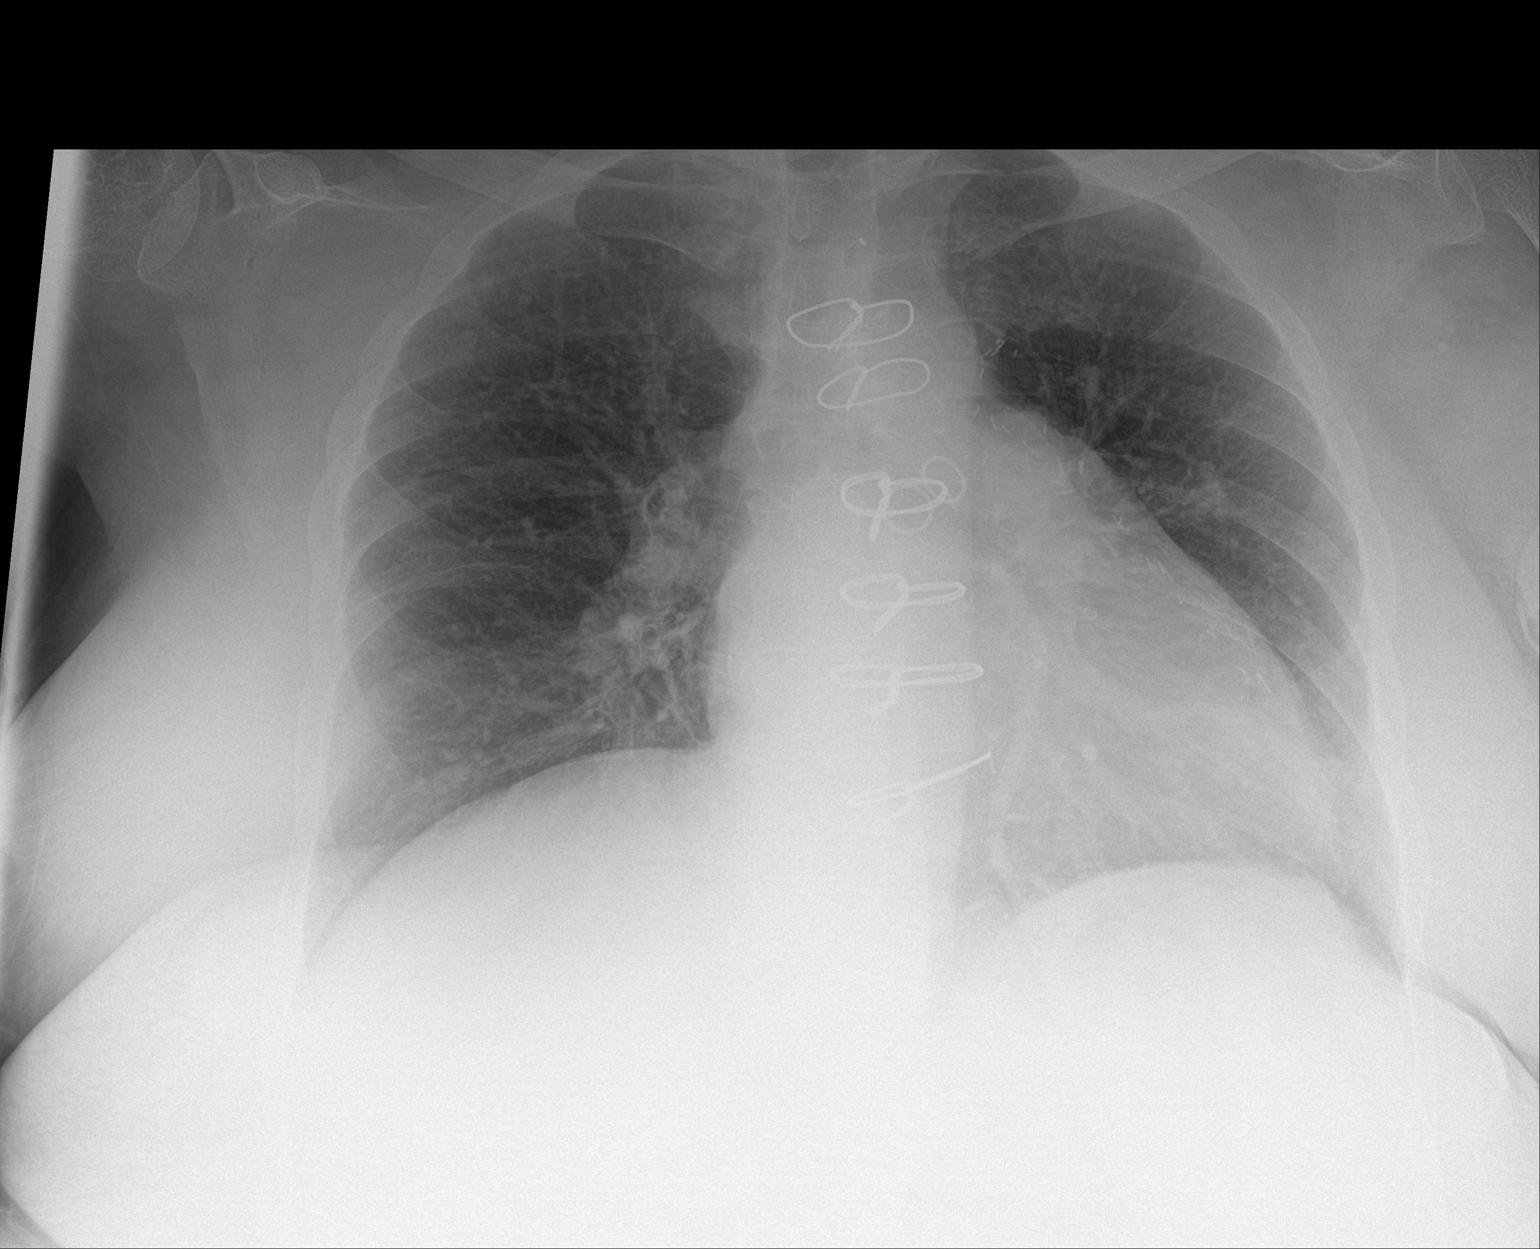

[2 of 2 positions shown; findings below may reference images not displayed]

FINDINGS: Heart size and pulmonary vascularity are normal and the lungs are
clear. No effusions. CABG. No acute osseous abnormality.
IMPRESSION: No active cardiopulmonary disease.
